# Patient Record
Sex: Male | Born: 1940 | Race: White | Hispanic: No | Marital: Married | Smoking: Never smoker
Health system: Southern US, Community
[De-identification: ages and names within clinical notes are randomized; demographics above are authoritative.]

## PROBLEM LIST (undated history)

## (undated) DIAGNOSIS — F32A Depression, unspecified: Secondary | ICD-10-CM

## (undated) DIAGNOSIS — Z8739 Personal history of other diseases of the musculoskeletal system and connective tissue: Secondary | ICD-10-CM

## (undated) DIAGNOSIS — S065XAA Traumatic subdural hemorrhage with loss of consciousness status unknown, initial encounter: Secondary | ICD-10-CM

## (undated) DIAGNOSIS — C801 Malignant (primary) neoplasm, unspecified: Secondary | ICD-10-CM

## (undated) DIAGNOSIS — I7771 Dissection of carotid artery: Secondary | ICD-10-CM

## (undated) DIAGNOSIS — L309 Dermatitis, unspecified: Secondary | ICD-10-CM

## (undated) DIAGNOSIS — G4730 Sleep apnea, unspecified: Secondary | ICD-10-CM

## (undated) DIAGNOSIS — G902 Horner's syndrome: Secondary | ICD-10-CM

## (undated) DIAGNOSIS — S064XAA Epidural hemorrhage with loss of consciousness status unknown, initial encounter: Secondary | ICD-10-CM

## (undated) DIAGNOSIS — R112 Nausea with vomiting, unspecified: Secondary | ICD-10-CM

## (undated) DIAGNOSIS — F329 Major depressive disorder, single episode, unspecified: Secondary | ICD-10-CM

## (undated) DIAGNOSIS — K649 Unspecified hemorrhoids: Secondary | ICD-10-CM

## (undated) DIAGNOSIS — E669 Obesity, unspecified: Secondary | ICD-10-CM

## (undated) DIAGNOSIS — H9190 Unspecified hearing loss, unspecified ear: Secondary | ICD-10-CM

## (undated) DIAGNOSIS — R972 Elevated prostate specific antigen [PSA]: Secondary | ICD-10-CM

## (undated) DIAGNOSIS — Q681 Congenital deformity of finger(s) and hand: Secondary | ICD-10-CM

## (undated) DIAGNOSIS — Z9889 Other specified postprocedural states: Secondary | ICD-10-CM

## (undated) DIAGNOSIS — I10 Essential (primary) hypertension: Secondary | ICD-10-CM

## (undated) DIAGNOSIS — E119 Type 2 diabetes mellitus without complications: Secondary | ICD-10-CM

## (undated) DIAGNOSIS — G43909 Migraine, unspecified, not intractable, without status migrainosus: Secondary | ICD-10-CM

## (undated) DIAGNOSIS — L719 Rosacea, unspecified: Secondary | ICD-10-CM

## (undated) HISTORY — PX: THUMB FUSION: SUR636

## (undated) HISTORY — DX: Obesity, unspecified: E66.9

## (undated) HISTORY — DX: Unspecified hearing loss, unspecified ear: H91.90

## (undated) HISTORY — DX: Essential (primary) hypertension: I10

## (undated) HISTORY — PX: ANKLE ARTHROSCOPY: SHX545

## (undated) HISTORY — DX: Depression, unspecified: F32.A

## (undated) HISTORY — DX: Horner's syndrome: G90.2

## (undated) HISTORY — DX: Sleep apnea, unspecified: G47.30

## (undated) HISTORY — PX: CHOLECYSTECTOMY: SHX55

## (undated) HISTORY — DX: Congenital deformity of finger(s) and hand: Q68.1

## (undated) HISTORY — PX: SUBDURAL HEMATOMA EVACUATION VIA CRANIOTOMY: SUR319

## (undated) HISTORY — DX: Dissection of carotid artery: I77.71

## (undated) HISTORY — PX: LIGAMENT REPAIR: SHX5444

## (undated) HISTORY — PX: RETINAL DETACHMENT SURGERY: SHX105

## (undated) HISTORY — DX: Dermatitis, unspecified: L30.9

## (undated) HISTORY — DX: Personal history of other diseases of the musculoskeletal system and connective tissue: Z87.39

## (undated) HISTORY — DX: Type 2 diabetes mellitus without complications: E11.9

## (undated) HISTORY — DX: Elevated prostate specific antigen (PSA): R97.20

## (undated) HISTORY — DX: Rosacea, unspecified: L71.9

## (undated) HISTORY — PX: TREATMENT FISTULA ANAL: SUR1390

## (undated) HISTORY — DX: Migraine, unspecified, not intractable, without status migrainosus: G43.909

## (undated) HISTORY — DX: Unspecified hemorrhoids: K64.9

## (undated) HISTORY — DX: Major depressive disorder, single episode, unspecified: F32.9

---
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
## 2004-10-28 HISTORY — PX: TREATMENT FISTULA ANAL: SUR1390

## 2005-10-28 HISTORY — PX: CHOLECYSTECTOMY: SHX55

## 2010-10-16 ENCOUNTER — Ambulatory Visit: Payer: Self-pay | Admitting: Gastroenterology

## 2012-11-12 ENCOUNTER — Ambulatory Visit: Payer: Self-pay | Admitting: Neurology

## 2012-11-17 ENCOUNTER — Ambulatory Visit: Payer: Self-pay | Admitting: Neurology

## 2013-10-12 ENCOUNTER — Ambulatory Visit: Payer: Self-pay | Admitting: Family Medicine

## 2013-11-01 ENCOUNTER — Ambulatory Visit: Payer: Self-pay | Admitting: Family Medicine

## 2014-06-13 DIAGNOSIS — F329 Major depressive disorder, single episode, unspecified: Secondary | ICD-10-CM | POA: Insufficient documentation

## 2014-06-13 DIAGNOSIS — I10 Essential (primary) hypertension: Secondary | ICD-10-CM | POA: Insufficient documentation

## 2014-06-13 DIAGNOSIS — F3341 Major depressive disorder, recurrent, in partial remission: Secondary | ICD-10-CM | POA: Insufficient documentation

## 2014-06-13 DIAGNOSIS — F32A Depression, unspecified: Secondary | ICD-10-CM | POA: Insufficient documentation

## 2014-06-13 DIAGNOSIS — G4730 Sleep apnea, unspecified: Secondary | ICD-10-CM | POA: Insufficient documentation

## 2014-08-01 ENCOUNTER — Ambulatory Visit (INDEPENDENT_AMBULATORY_CARE_PROVIDER_SITE_OTHER): Payer: Medicare Other

## 2014-08-01 ENCOUNTER — Ambulatory Visit (INDEPENDENT_AMBULATORY_CARE_PROVIDER_SITE_OTHER): Payer: Medicare Other | Admitting: Podiatry

## 2014-08-01 ENCOUNTER — Encounter: Payer: Self-pay | Admitting: Podiatry

## 2014-08-01 VITALS — BP 133/86 | HR 49 | Resp 16 | Ht 74.0 in | Wt 235.0 lb

## 2014-08-01 DIAGNOSIS — M779 Enthesopathy, unspecified: Secondary | ICD-10-CM

## 2014-08-01 DIAGNOSIS — M6740 Ganglion, unspecified site: Secondary | ICD-10-CM

## 2014-08-01 NOTE — Progress Notes (Signed)
   Subjective:    Patient ID: John [name], male    DOB: 01/24/1941, 73 y.o.   MRN: [id number]  HPI Comments: It feels like something is sticking out around my left ankle.(lateral)  i broke my ankle 7 yrs ago and i had surgery on it. The ankle does not hurt. The only time i feel something sticking out, is if im scratching it. i dont know how long its been sticking out cause it does not bother me. i dont do anything for my ankle.   Foot Pain      Review of Systems  Cardiovascular: Positive for palpitations.  All other systems reviewed and are negative.      Objective:   Physical Exam: I have reviewed his past medical history medications allergies surgeries social history and review of systems. Pulses are strongly palpable bilateral. Neurologic sensorium is intact per Semmes-Weinstein monofilament. Deep tendon reflexes are intact bilateral muscle strength is 5 over 5 dorsiflexors plantar flexors inverters everters all intrinsic musculature is intact. Orthopedic evaluation demonstrates all joints distal to the ankle a full range of motion without crepitation. Cutaneous evaluation demonstrates supple well hydrated cutis with exception of a mucoid cyst third digit of the left foot. There is a dry eschar present and no signs of infection. Radiographs does demonstrate a prominent screw from a previous open reduction internal fixation of the left fibula. This prominent screw is palpable on physical exam without signs of skin breakdown or infection.        Assessment & Plan:  Assessment: Painful internal fixation fibular malleolar screw left. He voices third digit PIPJ left foot. Non-complicated.  Plan: Discussed etiology pathology conservative versus surgical therapies. I expressed to him with either of these becoming painful I would be more than happy to surgically correct them for him. At this point they're not problem and I will followup with him as needed.

## 2015-05-12 ENCOUNTER — Encounter: Payer: Self-pay | Admitting: Urology

## 2015-05-12 ENCOUNTER — Other Ambulatory Visit: Payer: Self-pay | Admitting: Urology

## 2015-05-12 ENCOUNTER — Other Ambulatory Visit: Payer: Self-pay

## 2015-05-12 ENCOUNTER — Ambulatory Visit (INDEPENDENT_AMBULATORY_CARE_PROVIDER_SITE_OTHER): Payer: Medicare Other | Admitting: Urology

## 2015-05-12 VITALS — BP 144/78 | HR 52 | Ht 74.0 in | Wt 243.8 lb

## 2015-05-12 DIAGNOSIS — R972 Elevated prostate specific antigen [PSA]: Secondary | ICD-10-CM | POA: Diagnosis not present

## 2015-05-12 MED ORDER — GENTAMICIN SULFATE 40 MG/ML IJ SOLN
80.0000 mg | Freq: Once | INTRAMUSCULAR | Status: AC
  Administered 2015-05-12: 80 mg via INTRAMUSCULAR

## 2015-05-12 MED ORDER — LEVOFLOXACIN 500 MG PO TABS
500.0000 mg | ORAL_TABLET | Freq: Once | ORAL | Status: AC
  Administered 2015-05-12: 500 mg via ORAL

## 2015-05-12 NOTE — Progress Notes (Signed)
05/12/2015 10:46 AM   John [name] [birth year] [id number]  Referring provider: No referring provider defined for this encounter.  Chief Complaint  Patient presents with  . Prostate Biopsy    elevated PSA    HPI: 74 year old male initially referred for further workup of an elevated PSA. He presents today for prostate biopsy.  John [name] was found to have an elevated PSA on routine blood work by his PCP. On 02/26/2015, his PSA was found to be 7.9ng/mL and her PSA,free 0.81 and %free 10.3. Repeat PSA 8.8 on 03/20/15.   He is currently not having any irritative and/or obstructive voiding symptoms at this time. He also denies ED. He has not experienced any gross hematuria, weight loss, bone pain, fevers, chills and/or N/V. His father was diagnosed with PCa when he was in his late 110s. He did have an UTI 2 years ago.  Rectal exam enlarged, no nodules.    His IPSS is 1/0.    PMH: Past Medical History  Diagnosis Date  . Eczema   . Hemorrhoids   . H/O Osgood-Schlatter disease   . Depression   . Sleep apnea   . Migraine   . Rosacea   . Hand deformity, congenital   . Hearing loss bilateral  . Obesity   . Hypertension   . Elevated PSA     Surgical History: Past Surgical History  Procedure Laterality Date  . Cholecystectomy    . Treatment fistula anal    . Subdural hematoma evacuation via craniotomy    . Ankle arthroscopy    . Thumb fusion    . Retinal detachment surgery      Home Medications:    Medication List       This list is accurate as of: 05/12/15 10:46 AM.  Always use your most recent med list.               aspirin EC 81 MG tablet  Take by mouth.     atenolol 50 MG tablet  Commonly known as:  TENORMIN  Take 50 mg by mouth.     rizatriptan 10 MG tablet  Commonly known as:  MAXALT     venlafaxine XR 75 MG 24 hr capsule  Commonly known as:  EFFEXOR-XR  Take by mouth.        Allergies: No Known Allergies  Family History: Family  History  Problem Relation Age of Onset  . Osteoporosis Mother   . Depression Mother   . Prostate cancer Father   . Hypertension Father     Social History:  reports that he has never smoked. He does not have any smokeless tobacco history on file. He reports that he drinks alcohol. His drug history is not on file.   Physical Exam: BP 144/78 mmHg  Pulse 52  Ht 6\' 2"  (1.88 m)  Wt 243 lb 12.8 oz (110.587 kg)  BMI 31.29 kg/m2  Constitutional:  Alert and oriented, No acute distress. HEENT: [place] AT, moist mucus membranes.  Trachea midline, no masses. Cardiovascular: No clubbing, cyanosis, or edema. Respiratory: Normal respiratory effort, no increased work of breathing. GI: Abdomen is soft, nontender, nondistended, no abdominal masses.  Rectus diastasis noted. GU: No CVA tenderness. Normal sphincter tone. Skin: No rashes, bruises or suspicious lesions. Neurologic: Grossly intact, no focal deficits, moving all 4 extremities. Psychiatric: Normal mood and affect.   Prostate Biopsy Procedure   Informed consent was obtained after discussing risks/benefits of the procedure.  A time out was performed to  ensure correct patient identity.  Pre-Procedure: - Last PSA Level: 8.8 ng/ dL - Gentamicin given prophylactically - Levaquin 500 mg administered PO -Transrectal Ultrasound performed revealing a 68 gm prostate -Small median lobe noted with intravesical protrusion  Procedure: - Prostate block performed using 10 cc 1% lidocaine and biopsies taken from sextant areas, a total of 12 under ultrasound guidance.  Post-Procedure: - Patient tolerated the procedure well - He was counseled to seek immediate medical attention if experiences any severe pain, significant bleeding, or fevers - Return in one week to discuss biopsy results   Assessment & Plan:  As above  1. Elevated PSA S/p uncomplicated biopsy - levofloxacin (LEVAQUIN) tablet 500 mg; Take 1 tablet (500 mg total) by mouth once. -  gentamicin (GARAMYCIN) injection 80 mg; Inject 2 mLs (80 mg total) into the muscle once.   Return in about 1 week (around 05/19/2015) for biopsy results.  Hollice Espy, MD  [facility] [address], [place], [place] [address] [phone]

## 2015-05-12 NOTE — Progress Notes (Signed)
IM Injection  Patient is present today for an IM Injection prior to prostat biopsy Drug: Gentamicin Dose:80mg  Location:Left upper outer buttocks Lot: [id number] Exp:2/17 Patient tolerated well, no complications were noted  Preformed by: Fonnie Jarvis, CMA

## 2015-05-12 NOTE — Patient Instructions (Signed)

## 2015-05-17 LAB — PATHOLOGY REPORT

## 2015-05-18 ENCOUNTER — Other Ambulatory Visit: Payer: Self-pay | Admitting: Urology

## 2015-05-23 ENCOUNTER — Ambulatory Visit: Payer: Self-pay | Admitting: Urology

## 2015-05-31 ENCOUNTER — Ambulatory Visit: Payer: Medicare Other | Admitting: Urology

## 2015-05-31 ENCOUNTER — Ambulatory Visit (INDEPENDENT_AMBULATORY_CARE_PROVIDER_SITE_OTHER): Payer: Medicare Other | Admitting: Urology

## 2015-05-31 VITALS — BP 143/80 | HR 50 | Ht 74.0 in | Wt 243.4 lb

## 2015-05-31 DIAGNOSIS — C61 Malignant neoplasm of prostate: Secondary | ICD-10-CM

## 2015-06-04 ENCOUNTER — Encounter: Payer: Self-pay | Admitting: Urology

## 2015-06-04 DIAGNOSIS — C61 Malignant neoplasm of prostate: Secondary | ICD-10-CM | POA: Insufficient documentation

## 2015-06-04 NOTE — Progress Notes (Signed)
12:29 PM  05/31/15  [name] 1941/08/09 [id number]  Referring provider: No referring provider defined for this encounter.  Chief Complaint  Patient presents with  . Results    HPI: 74 year old male initially referred for further workup of an elevated PSA to 8.8, normal DRE s/p prostate biopsy on 05/12/15 demonstrating Gleason 3+3 involving 1 of 12 cores, 32% involvement. TRUS volume 68 cc with slight intravesical protrusion of the median lobe.  He returns to the office today to discuss his biopsy pathology results.   He is currently not having any irritative and/or obstructive voiding symptoms. He also denies ED. He has not experienced any gross hematuria, weight loss, bone pain, fevers, chills and/or N/V. His father was diagnosed with PCa when he was in his late 68s but died in his 21s of other casues.   His IPSS is 1/0.    PMH: Past Medical History  Diagnosis Date  . Eczema   . Hemorrhoids   . H/O Osgood-Schlatter disease   . Depression   . Sleep apnea   . Migraine   . Rosacea   . Hand deformity, congenital   . Hearing loss bilateral  . Obesity   . Hypertension   . Elevated PSA     Surgical History: Past Surgical History  Procedure Laterality Date  . Cholecystectomy    . Treatment fistula anal    . Subdural hematoma evacuation via craniotomy    . Ankle arthroscopy    . Thumb fusion    . Retinal detachment surgery      Home Medications:    Medication List       This list is accurate as of: 05/31/15 11:59 PM.  Always use your most recent med list.               aspirin EC 81 MG tablet  Take by mouth.     atenolol 50 MG tablet  Commonly known as:  TENORMIN  Take 50 mg by mouth daily.     rizatriptan 10 MG tablet  Commonly known as:  MAXALT     venlafaxine XR 75 MG 24 hr capsule  Commonly known as:  EFFEXOR-XR  Take by mouth.        Allergies: No Known Allergies  Family History: Family History  Problem Relation Age of Onset  .  Osteoporosis Mother   . Depression Mother   . Prostate cancer Father   . Hypertension Father     Social History:  reports that he has never smoked. He does not have any smokeless tobacco history on file. He reports that he drinks alcohol. His drug history is not on file.   Physical Exam: BP 143/80 mmHg  Pulse 50  Ht 6\' 2"  (1.88 m)  Wt 243 lb 6.4 oz (110.406 kg)  BMI 31.24 kg/m2  Constitutional:  Alert and oriented, No acute distress. HEENT: [place] AT, moist mucus membranes.  Trachea midline, no masses. Respiratory: Normal respiratory effort, no increased work of breathing. Skin: No rashes, bruises or suspicious lesions. Neurologic: Grossly intact, no focal deficits, moving all 4 extremities. Psychiatric: Normal mood and affect.  Pathology:  Diagnosis:  A. PROSTATIC ADENOCARCINOMA. GLEASON'S SCORE 6 (GRADES 3 + 3) NOTED IN 1  OUT OF 1 SUBMITTED PROSTATE CORE SEGMENTS. APPROXIMATELY 32% OF SUBMITTED  TISSUE INVOLVED.  B. BENIGN PROSTATE TISSUE. NO EVIDENCE OF MALIGNANCY.  C. BENIGN PROSTATE TISSUE. NO EVIDENCE OF MALIGNANCY.  D. BENIGN PROSTATE TISSUE. NO EVIDENCE OF MALIGNANCY.  E. BENIGN PROSTATE  TISSUE. NO EVIDENCE OF MALIGNANCY.  F. PROSTATIC TISSUE CONTAINING A SMALL FOCUS OF ATYPICAL GLANDS SUSPICIOUS  BUT NOT DIAGNOSTIC OF ADENOCARCINOMA.  G. BENIGN PROSTATE TISSUE. NO EVIDENCE OF MALIGNANCY.  H. BENIGN PROSTATE TISSUE WITH FOCAL CHRONIC INFLAMMATION. NO EVIDENCE OF  MALIGNANCY.  I. BENIGN PROSTATE TISSUE. NO EVIDENCE OF MALIGNANCY.  J. BENIGN PROSTATE TISSUE. NO EVIDENCE OF MALIGNANCY.  K. BENIGN PROSTATE TISSUE. NO EVIDENCE OF MALIGNANCY.  L. BENIGN PROSTATE TISSUE. NO EVIDENCE OF MALIGNANCY.   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    1. Prostate cancer The patient was counseled about the natural history of prostate cancer and the standard treatment options that are  available for prostate cancer. It was explained to him how his age and life expectancy, clinical stage, Gleason score, and PSA affect his prognosis, the decision to proceed with additional staging studies, as well as how that information influences recommended treatment strategies. We discussed the roles for active surveillance, radiation therapy, surgical therapy, androgen deprivation, as well as ablative therapy options for the treatment of prostate cancer as appropriate to his individual cancer situation. We discussed the risks and benefits of these options with regard to their impact on cancer control and also in terms of potential adverse events, complications, and impact on quality of life particularly related to urinary, bowel, and sexual function. The patient was encouraged to ask questions throughout the discussion today and all questions were answered to his stated satisfaction. In addition, the patient was provided with and/or directed to appropriate resources and literature for further education about prostate cancer treatment options.  In the patient's low risk prostate cancer age, I strongly encouraged her to consider active surveillance. He was also offered a referral to radiation oncology to further discuss. I do not feel that he is an excellent surgical candidate given his age and low risk diease.    We discussed the active surveillance protocol in detail including serial DRE/PSA surveillance and repeat prostate biopsy in 6 months to 1 year.  He would like to proceed with active surveillance at this time.    GU oncology navigator was present for today's visit and has provided him with contact information as well.   Return in about 4 months (around 09/30/2015) for PSA/ DRE.  Hollice Espy, MD  [facility] [address], [place], [place] [address] [phone]  I spent 30 min with this patient of which greater than 50% was spent in counseling and  coordination of care with the patient.

## 2015-06-19 DIAGNOSIS — C61 Malignant neoplasm of prostate: Secondary | ICD-10-CM | POA: Insufficient documentation

## 2015-07-20 ENCOUNTER — Other Ambulatory Visit: Payer: Self-pay | Admitting: Ophthalmology

## 2015-07-20 ENCOUNTER — Ambulatory Visit
Admission: RE | Admit: 2015-07-20 | Discharge: 2015-07-20 | Disposition: A | Discharge status: Home / Self Care | Payer: Medicare Other | Source: Ambulatory Visit | Attending: Ophthalmology | Admitting: Ophthalmology

## 2015-07-20 DIAGNOSIS — C61 Malignant neoplasm of prostate: Secondary | ICD-10-CM | POA: Diagnosis not present

## 2015-07-20 DIAGNOSIS — G902 Horner's syndrome: Secondary | ICD-10-CM

## 2015-07-20 HISTORY — DX: Malignant (primary) neoplasm, unspecified: C80.1

## 2015-07-20 LAB — POCT I-STAT CREATININE: CREATININE: 0.8 mg/dL (ref 0.61–1.24)

## 2015-07-20 MED ORDER — IOHEXOL 350 MG/ML SOLN
80.0000 mL | Freq: Once | INTRAVENOUS | Status: AC | PRN
  Administered 2015-07-20: 80 mL via INTRAVENOUS

## 2015-07-31 ENCOUNTER — Ambulatory Visit: Payer: Medicare Other

## 2015-08-31 ENCOUNTER — Other Ambulatory Visit: Payer: Self-pay | Admitting: Neurology

## 2015-08-31 DIAGNOSIS — I7771 Dissection of carotid artery: Secondary | ICD-10-CM

## 2015-08-31 DIAGNOSIS — R404 Transient alteration of awareness: Secondary | ICD-10-CM

## 2015-08-31 HISTORY — DX: Dissection of carotid artery: I77.71

## 2015-09-11 ENCOUNTER — Ambulatory Visit
Admission: RE | Admit: 2015-09-11 | Discharge: 2015-09-11 | Disposition: A | Discharge status: Home / Self Care | Payer: Medicare Other | Source: Ambulatory Visit | Attending: Neurology | Admitting: Neurology

## 2015-09-11 DIAGNOSIS — R9082 White matter disease, unspecified: Secondary | ICD-10-CM | POA: Insufficient documentation

## 2015-09-11 DIAGNOSIS — R404 Transient alteration of awareness: Secondary | ICD-10-CM | POA: Insufficient documentation

## 2015-09-11 MED ORDER — GADOBENATE DIMEGLUMINE 529 MG/ML IV SOLN
20.0000 mL | Freq: Once | INTRAVENOUS | Status: AC | PRN
  Administered 2015-09-11: 20 mL via INTRAVENOUS

## 2015-10-06 ENCOUNTER — Encounter: Payer: Self-pay | Admitting: Urology

## 2015-10-06 ENCOUNTER — Ambulatory Visit (INDEPENDENT_AMBULATORY_CARE_PROVIDER_SITE_OTHER): Payer: Medicare Other | Admitting: Urology

## 2015-10-06 VITALS — BP 126/81 | HR 63 | Ht 74.0 in | Wt 248.7 lb

## 2015-10-06 DIAGNOSIS — C61 Malignant neoplasm of prostate: Secondary | ICD-10-CM | POA: Diagnosis not present

## 2015-10-06 LAB — URINALYSIS, COMPLETE
BILIRUBIN UA: NEGATIVE
Glucose, UA: NEGATIVE
KETONES UA: NEGATIVE
LEUKOCYTES UA: NEGATIVE
Nitrite, UA: NEGATIVE
PROTEIN UA: NEGATIVE
RBC UA: NEGATIVE
UUROB: 0.2 mg/dL (ref 0.2–1.0)
pH, UA: 5 (ref 5.0–7.5)

## 2015-10-06 LAB — MICROSCOPIC EXAMINATION: RBC, UA: NONE SEEN /hpf (ref 0–?)

## 2015-10-06 NOTE — Progress Notes (Signed)
9:00 AM  05/31/15  John [name] 1941-03-11 [id number]  Referring provider: PCP [place]   Prostate cancer  HPI: 75 year old male with lower risk prostate cancer dx 04/2015 on active surviellnece.  He returns today for DRE/ PSA.  He was nitially referred for further workup of an elevated PSA to 8.8, normal DRE s/p prostate biopsy on 05/12/15 demonstrating Gleason 3+3 involving 1 of 12 cores, 32% involvement. TRUS volume 68 cc with slight intravesical protrusion of the median lobe.   He is currently not having any irritative and/or obstructive voiding symptoms. He also denies ED. He has not experienced any gross hematuria, weight loss, bone pain, fevers, chills and/or N/V. His father was diagnosed with PCa when he was in his late 35s but died in his 10s of other casues.   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      PMH: Past Medical History  Diagnosis Date  . Eczema   . Hemorrhoids   . H/O Osgood-Schlatter disease   . Depression   . Sleep apnea   . Migraine   . Rosacea   . Hand deformity, congenital   . Hearing loss bilateral  . Obesity   . Hypertension   . Elevated PSA   . Cancer ([place])   . Horner syndrome   . Depression   . 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 Cholecystectomy    . Treatment fistula anal    . Subdural hematoma evacuation via craniotomy    . Ankle arthroscopy    . Thumb fusion    . Retinal detachment surgery      Home Medications:    Medication List       This list is accurate as of: 10/06/15  9:00 AM.  Always use your most recent med list.               aspirin EC 81 MG tablet  Take by mouth.     atenolol 50 MG tablet  Commonly known as:  TENORMIN  Take 50 mg by mouth daily.     clopidogrel 75 MG tablet  Commonly known as:  PLAVIX     rizatriptan 10 MG tablet  Commonly known  as:  MAXALT     venlafaxine XR 75 MG 24 hr capsule  Commonly known as:  EFFEXOR-XR  TAKE ONE CAPSULE BY MOUTH ONCE A DAY        Allergies: No Known Allergies  Family History: Family History  Problem Relation Age of Onset  . Osteoporosis Mother   . Depression Mother   . Prostate cancer Father   . Hypertension Father     Social History:  reports that he has never smoked. He does not have any smokeless tobacco history on file. He reports that he drinks alcohol. His drug history is not on file.   Physical Exam: BP 126/81 mmHg  Pulse 63  Ht 6\' 2"  (1.88 m)  Wt 248 lb 11.2 oz (112.81 kg)  BMI 31.92 kg/m2  Constitutional:  Alert and oriented, No acute distress. HEENT: [place] AT, moist mucus membranes.  Trachea midline, no masses. Respiratory: Normal respiratory effort, no increased work of breathing. Abd: Soft, nontender, nondistended Rectal exam: Slightly lax sphincter tone. 50 cc prostate, nontender, no nodules Skin: No rashes, bruises or suspicious lesions. Neurologic: Grossly intact, no focal deficits, moving all 4 extremities. Psychiatric:  Normal mood and affect.  Pathology:  Diagnosis:  A. PROSTATIC ADENOCARCINOMA. GLEASON'S SCORE 6 (GRADES 3 + 3) NOTED IN 1  OUT OF 1 SUBMITTED PROSTATE CORE SEGMENTS. APPROXIMATELY 32% OF SUBMITTED  TISSUE INVOLVED.  B. BENIGN PROSTATE TISSUE. NO EVIDENCE OF MALIGNANCY.  C. BENIGN PROSTATE TISSUE. NO EVIDENCE OF MALIGNANCY.  D. BENIGN PROSTATE TISSUE. NO EVIDENCE OF MALIGNANCY.  E. BENIGN PROSTATE TISSUE. NO EVIDENCE OF MALIGNANCY.  F. PROSTATIC TISSUE CONTAINING A SMALL FOCUS OF ATYPICAL GLANDS SUSPICIOUS  BUT NOT DIAGNOSTIC OF ADENOCARCINOMA.  G. BENIGN PROSTATE TISSUE. NO EVIDENCE OF MALIGNANCY.  H. BENIGN PROSTATE TISSUE WITH FOCAL CHRONIC INFLAMMATION. NO EVIDENCE OF  MALIGNANCY.  I. BENIGN PROSTATE TISSUE. NO EVIDENCE OF MALIGNANCY.  J. BENIGN PROSTATE TISSUE. NO EVIDENCE OF MALIGNANCY.  K. BENIGN PROSTATE TISSUE. NO  EVIDENCE OF MALIGNANCY.  L. BENIGN PROSTATE TISSUE. NO EVIDENCE OF MALIGNANCY.   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    1. Prostate cancer PSA drawn today, will follow up with patient  rectal exam remains benign Recommend continuation of active surveillance protocol including PSA in 4 months Plan for repeat biopsy in 04/2016  Return in about 4 months (around 02/04/2016) for PSA/ DRE.   Hollice Espy, MD  [facility] [address], [place], [place] [address] [phone]

## 2015-10-07 LAB — PSA: PROSTATE SPECIFIC AG, SERUM: 9.3 ng/mL — AB (ref 0.0–4.0)

## 2015-10-09 ENCOUNTER — Telehealth: Payer: Self-pay

## 2015-10-09 NOTE — Telephone Encounter (Signed)
-----   Message from Hollice Espy, MD sent at 10/09/2015 12:10 PM EST ----- Your PSA is relatively stable.   Plan for recheck in 4 months.  Hollice Espy, MD

## 2015-10-09 NOTE — Telephone Encounter (Signed)
LMOM

## 2015-10-10 NOTE — Telephone Encounter (Signed)
Spoke with pt in reference to PSA. Pt voiced understanding.  

## 2015-10-10 NOTE — Telephone Encounter (Signed)
LMOM

## 2015-12-22 DIAGNOSIS — G43909 Migraine, unspecified, not intractable, without status migrainosus: Secondary | ICD-10-CM | POA: Insufficient documentation

## 2016-01-26 ENCOUNTER — Other Ambulatory Visit: Payer: Medicare Other

## 2016-01-26 DIAGNOSIS — C61 Malignant neoplasm of prostate: Secondary | ICD-10-CM

## 2016-01-27 LAB — PSA: Prostate Specific Ag, Serum: 10.2 ng/mL — ABNORMAL HIGH (ref 0.0–4.0)

## 2016-01-29 ENCOUNTER — Telehealth: Payer: Self-pay

## 2016-01-29 NOTE — Telephone Encounter (Signed)
-----   Message from Hollice Espy, MD sent at 01/29/2016  7:29 AM EDT ----- PSA up slightly again to 10.2 from baseline 8.8 at the time of prostate cancer dx.  I would like to plan for biopsy on 04/2016 as discussed.  Please make sure that this is arranged.    Hollice Espy, MD

## 2016-01-29 NOTE — Telephone Encounter (Signed)
LMOM

## 2016-01-29 NOTE — Telephone Encounter (Signed)
Spoke with pt in reference to PSA results. Pt agreed to prostate bx. Verbally went over pre-bx prep. Will mail out pre prostate bx instructions. Will obtain clearance from PCP to stop ASA. Pt stated he has stopped plavix previously. Pt voiced understanding of conversation.

## 2016-02-07 ENCOUNTER — Encounter: Payer: Self-pay | Admitting: Urology

## 2016-02-07 ENCOUNTER — Ambulatory Visit: Payer: Medicare Other | Admitting: Urology

## 2016-02-07 ENCOUNTER — Telehealth: Payer: Self-pay | Admitting: Urology

## 2016-02-07 NOTE — Telephone Encounter (Signed)
Mr. John [name] called saying he'd forgotten about his scheduled appointment today and is wondering if he needs to reschedule the appointment before his biopsy in July. He'd like a phone call regarding this.  Pt's ph# [phone]  Thank you.

## 2016-02-07 NOTE — Telephone Encounter (Signed)
Spoke with pt and made aware he does need to come in for a PSA and DRE. Reinforced with pt these need to be performed to ensure there have been no changes and if so they can be addressed. Pt voiced understanding and was transferred to the front to make f/u appt.

## 2016-02-09 ENCOUNTER — Ambulatory Visit: Payer: Medicare Other | Admitting: Urology

## 2016-02-15 ENCOUNTER — Ambulatory Visit (INDEPENDENT_AMBULATORY_CARE_PROVIDER_SITE_OTHER): Payer: Medicare Other | Admitting: Urology

## 2016-02-15 ENCOUNTER — Encounter: Payer: Self-pay | Admitting: Urology

## 2016-02-15 VITALS — BP 133/83 | HR 54 | Ht 74.0 in | Wt 249.9 lb

## 2016-02-15 DIAGNOSIS — C61 Malignant neoplasm of prostate: Secondary | ICD-10-CM

## 2016-02-15 NOTE — Progress Notes (Signed)
02/15/2016 11:50 AM   John [name] [birth year] [id number]  Referring provider: Juluis Pitch, MD [address] [place], [place] [address]  Chief Complaint  Patient presents with  . 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  Background history He was initially referred for further workup of an elevated PSA to 8.8, normal DRE s/p prostate biopsy on 05/12/15 demonstrating Gleason 3+3 involving 1 of 12 cores, 32% involvement. TRUS volume 68 cc with slight intravesical protrusion of the median lobe.  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   His most recent PSA was 10.2 on 01/26/2016.  He is scheduled for a biopsy in July 2017.   He is currently not having any irritative and/or obstructive voiding symptoms. He also denies ED. He has not experienced any gross hematuria, weight loss, bone pain, fevers, chills and/or N/V. His father was diagnosed with PCa when he was in his late 67s but died in his 62s of other causes.   His I PSS score today was 1/1.      IPSS      02/15/16 1100       International Prostate Symptom Score   How often have you had the sensation of not emptying your bladder? Not at All     How often have you had to urinate less than every two hours? Not at All     How often have you found you stopped and started again several times when you urinated? Not at All     How often have you found it difficult to postpone urination? Not at All     How often have you had a weak urinary stream? Not at All     How often have you had to strain to start urination? Not at All     How many times did you typically get up at night to urinate? 1 Time     Total IPSS Score 1     Quality of Life due to urinary symptoms   If you were to spend the rest of your life  with your urinary condition just the way it is now how would you feel about that? Pleased        Score:  1-7 Mild 8-19 Moderate 20-35 Severe  PMH: Past Medical History  Diagnosis Date  . Eczema   . Hemorrhoids   . H/O Osgood-Schlatter disease   . Depression   . Sleep apnea   . Migraine   . Rosacea   . Hand deformity, congenital   . Hearing loss bilateral  . Obesity   . Hypertension   . Elevated PSA   . Cancer ([place])   . Horner syndrome   . Depression   . 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 Cholecystectomy    . Treatment fistula anal    . Subdural hematoma evacuation via craniotomy    . Ankle arthroscopy    . Thumb fusion    . Retinal detachment surgery      Home Medications:    Medication List       This list is accurate as of: 02/15/16 11:50 AM.  Always use your most recent med list.  aspirin EC 81 MG tablet  Take by mouth.     atenolol 50 MG tablet  Commonly known as:  TENORMIN  Take 50 mg by mouth daily.     clopidogrel 75 MG tablet  Commonly known as:  PLAVIX  Reported on 02/15/2016     levETIRAcetam 500 MG tablet  Commonly known as:  KEPPRA  Take by mouth.     rizatriptan 10 MG tablet  Commonly known as:  MAXALT     venlafaxine XR 75 MG 24 hr capsule  Commonly known as:  EFFEXOR-XR  TAKE ONE CAPSULE BY MOUTH ONCE A DAY        Allergies: No Known Allergies  Family History: Family History  Problem Relation Age of Onset  . Osteoporosis Mother   . Depression Mother   . Prostate cancer Father   . Hypertension Father   . Kidney disease Neg Hx     Social History:  reports that he has never smoked. He does not have any smokeless tobacco history on file. He reports that he drinks alcohol. He reports that he does not use illicit drugs.  ROS: UROLOGY Frequent Urination?: No Hard to postpone urination?: No Burning/pain with urination?:  No Get up at night to urinate?: No Leakage of urine?: No Urine stream starts and stops?: No Trouble starting stream?: No Do you have to strain to urinate?: No Blood in urine?: No Urinary tract infection?: No Sexually transmitted disease?: No Injury to kidneys or bladder?: No Painful intercourse?: No Weak stream?: No Erection problems?: No Penile pain?: No  Gastrointestinal Nausea?: No Vomiting?: No Indigestion/heartburn?: No Diarrhea?: No Constipation?: No  Constitutional Fever: No Night sweats?: No Weight loss?: No Fatigue?: No  Skin Skin rash/lesions?: No Itching?: No  Eyes Blurred vision?: No Double vision?: No  Ears/Nose/Throat Sore throat?: No Sinus problems?: No  Hematologic/Lymphatic Swollen glands?: No Easy bruising?: No  Cardiovascular Leg swelling?: No Chest pain?: No  Respiratory Cough?: No Shortness of breath?: No  Endocrine Excessive thirst?: No  Musculoskeletal Back pain?: No Joint pain?: No  Neurological Headaches?: No Dizziness?: No  Psychologic Depression?: No Anxiety?: No  Physical Exam: BP 133/83 mmHg  Pulse 54  Ht 6\' 2"  (1.88 m)  Wt 249 lb 14.4 oz (113.354 kg)  BMI 32.07 kg/m2  Constitutional: Well nourished. Alert and oriented, No acute distress. HEENT: [place] AT, moist mucus membranes. Trachea midline, no masses. Cardiovascular: No clubbing, cyanosis, or edema. Respiratory: Normal respiratory effort, no increased work of breathing. Rectal: Patient with  normal sphincter tone. Anus and perineum without scarring or rashes. No rectal masses are appreciated. Prostate is approximately 50 grams, no nodules are appreciated. Seminal vesicles are normal. Skin: No rashes, bruises or suspicious lesions. Lymph: No cervical or inguinal adenopathy. Neurologic: Grossly intact, no focal deficits, moving all 4 extremities. Psychiatric: Normal mood and affect.  Laboratory Data:  Lab Results  Component Value Date   CREATININE  0.80 07/20/2015    PSA history  8.8 ng/mL on 05/18/2015, biopsy at that time noted low risk Gleason 3+3 prostate cancer  9.3 ng/mL on 10/06/2015  10.2 ng/mL on 01/26/2016     Assessment & Plan:    1. Prostate cancer:   Patient diagnosed with low risk Gleason 3+3 prostate cancer, cT1cNxMx, PSA 8.8, TRUS 68cc in 07/216.  Since that time PSA's have remained relatively stable.  He will continue the active surveillance protocol and will be returning in July 2017 for a repeat biopsy.  Patient is on anticoagulation therapy and will  need clearance prior to coming off those medications.   Return for keep follow up biopsy appointment in July with Dr. Erlene Quan.  These notes generated with voice recognition software. I apologize for typographical errors.  Zara Council, [facility] [address], [place], [place] [address] [phone]

## 2016-02-18 ENCOUNTER — Telehealth: Payer: Self-pay | Admitting: Urology

## 2016-02-18 NOTE — Telephone Encounter (Signed)
Patient has an upcoming prostate biopsy in July 2017.  He is on anticoagulation therapy for a carotid dissection.  He will need clearance to stop these medications prior to the biopsy.

## 2016-02-23 NOTE — Telephone Encounter (Signed)
Will obtain clearance.

## 2016-04-11 ENCOUNTER — Other Ambulatory Visit: Payer: Self-pay | Admitting: Urology

## 2016-04-11 ENCOUNTER — Encounter: Payer: Self-pay | Admitting: Urology

## 2016-04-11 ENCOUNTER — Ambulatory Visit (INDEPENDENT_AMBULATORY_CARE_PROVIDER_SITE_OTHER): Payer: Medicare Other | Admitting: Urology

## 2016-04-11 VITALS — BP 144/92 | HR 54 | Ht 74.0 in | Wt 238.0 lb

## 2016-04-11 DIAGNOSIS — Z8546 Personal history of malignant neoplasm of prostate: Secondary | ICD-10-CM

## 2016-04-11 MED ORDER — GENTAMICIN SULFATE 40 MG/ML IJ SOLN
80.0000 mg | Freq: Once | INTRAMUSCULAR | Status: AC
  Administered 2016-04-11: 80 mg via INTRAMUSCULAR

## 2016-04-11 MED ORDER — LEVOFLOXACIN 500 MG PO TABS
500.0000 mg | ORAL_TABLET | Freq: Once | ORAL | Status: AC
  Administered 2016-04-11: 500 mg via ORAL

## 2016-04-11 NOTE — Progress Notes (Signed)
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  Background history He was initially referred for further workup of an elevated PSA to 8.8, normal DRE s/p prostate biopsy on 05/12/15 demonstrating Gleason 3+3 involving 1 of 12 cores, 32% involvement. TRUS volume 68 cc with slight intravesical protrusion of the median lobe.   His most recent PSA was 10.2 on 01/26/2016. He is scheduled for a biopsy in July 2017. He is currently not having any irritative and/or obstructive voiding symptoms. He also denies ED. He has not experienced any gross hematuria, weight loss, bone pain, fevers, chills and/or N/V. His father was diagnosed with PCa when he was in his late 53s but died in his 44s of other causes.   His I PSS score today was 1/1.   Prostate Biopsy Procedure   Informed consent was obtained after discussing risks/benefits of the procedure.  A time out was performed to ensure correct patient identity.  Pre-Procedure: - Last PSA Level:  Component     Latest Ref Rng 10/06/2015 01/26/2016  PSA     0.0 - 4.0 ng/mL 9.3 (H) 10.2 (H)   - Gentamicin given prophylactically - Levaquin 500 mg administered PO -Transrectal Ultrasound performed revealing a 68 gm prostate -No significant hypoechoic intravesical median lobe (small/ med) appreciated multiple diffuse calcifications  Procedure: - Prostate block performed using 10 cc 1% lidocaine and biopsies taken from sextant areas, a total of 12 under ultrasound guidance.  Post-Procedure: - Patient tolerated the procedure well - He was counseled to seek immediate medical attention if experiences any severe pain, significant bleeding, or fevers - Return in one week to discuss biopsy results

## 2016-04-17 LAB — PATHOLOGY REPORT

## 2016-04-19 ENCOUNTER — Ambulatory Visit: Payer: Medicare Other | Admitting: Urology

## 2016-04-19 ENCOUNTER — Other Ambulatory Visit: Payer: Medicare Other | Admitting: Urology

## 2016-04-19 ENCOUNTER — Telehealth: Payer: Self-pay | Admitting: Urology

## 2016-04-19 ENCOUNTER — Other Ambulatory Visit: Payer: Self-pay | Admitting: Urology

## 2016-04-19 NOTE — Telephone Encounter (Signed)
Call patient to review pathology results. These are essentially unchanged from previous. He had 1 core positive Gleason 3+3, 5% of the core at the right lateral base. There is also evidence of chronic inflammation.  I have recommended continuation of active surveillance of which the patient is agreeable.   Plan for follow-up in 4 months with PSA prior.  Hollice Espy, MD

## 2016-04-21 ENCOUNTER — Encounter: Payer: Self-pay | Admitting: Urology

## 2016-04-22 ENCOUNTER — Telehealth: Payer: Self-pay

## 2016-04-22 NOTE — Telephone Encounter (Signed)
appts have been made and patient has been notified   michelle

## 2016-04-22 NOTE — Telephone Encounter (Signed)
Pt called stating that he had a prostate bx on 04/11/16 with Dr. Erlene Quan. Pt stated that Dr. Erlene Quan stressed the importance of going to the ER if develops a fever. Pt stated that he has been running a low grade fever of 99 and feels as though he is getting the flu. Pt described his symptoms to be back pain, joint and muscle aches, sore throat with low grade fever. Pt stated that he did not feel he was severe enough for the ER but did want to call. Please advise.

## 2016-04-22 NOTE — Telephone Encounter (Signed)
Per Larene Beach if pt is having urinary like symptoms then he should be evaluated. If not then he should monitor is his symptoms and if he gets rigors or fever spikes 102+ he should go to the ER.  Spoke with pt in reference to urinary symptoms. Pt denied dysuria, hematuria, feeling of not emptying his bladder. Pt stated that he has had a UTI previously and he does not feel like he did then. Pt stated that his urine stream is full and in that "area" he feels normal. Reinforced with pt to monitor is current symptoms of body aches, low grade fever, and should he develop a 102+ greater fever or rigors he should go straight to the ER. Pt voiced understanding of whole conversation.

## 2016-05-07 ENCOUNTER — Ambulatory Visit: Payer: Medicare Other | Admitting: Urology

## 2016-05-10 ENCOUNTER — Other Ambulatory Visit: Payer: Medicare Other | Admitting: Urology

## 2016-05-17 ENCOUNTER — Ambulatory Visit: Payer: Medicare Other | Admitting: Urology

## 2016-06-21 DIAGNOSIS — Z87898 Personal history of other specified conditions: Secondary | ICD-10-CM | POA: Insufficient documentation

## 2016-06-28 ENCOUNTER — Other Ambulatory Visit: Payer: Medicare Other | Admitting: Urology

## 2016-07-05 ENCOUNTER — Ambulatory Visit: Payer: Medicare Other | Admitting: Urology

## 2016-08-16 ENCOUNTER — Other Ambulatory Visit: Payer: Self-pay

## 2016-08-16 DIAGNOSIS — Z8546 Personal history of malignant neoplasm of prostate: Secondary | ICD-10-CM

## 2016-08-19 ENCOUNTER — Other Ambulatory Visit: Payer: Medicare Other

## 2016-08-19 DIAGNOSIS — Z8546 Personal history of malignant neoplasm of prostate: Secondary | ICD-10-CM

## 2016-08-20 LAB — PSA: Prostate Specific Ag, Serum: 14.6 ng/mL — ABNORMAL HIGH (ref 0.0–4.0)

## 2016-08-23 ENCOUNTER — Encounter: Payer: Self-pay | Admitting: Urology

## 2016-08-23 ENCOUNTER — Ambulatory Visit (INDEPENDENT_AMBULATORY_CARE_PROVIDER_SITE_OTHER): Payer: Medicare Other | Admitting: Urology

## 2016-08-23 VITALS — BP 142/79 | HR 55 | Ht 74.0 in | Wt 239.0 lb

## 2016-08-23 DIAGNOSIS — C61 Malignant neoplasm of prostate: Secondary | ICD-10-CM

## 2016-08-23 NOTE — Progress Notes (Signed)
08/23/2016 10:34 AM   [name] Aug 14, 1941 [id number]  Referring provider: Juluis Pitch, MD [phone] 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    Background history He was initially referred for further workup of an elevated PSA to 8.8, normal DRE s/p prostate biopsy on 05/12/15 demonstrating Gleason 3+3 involving 1 of 12 cores, 32% involvement. TRUS volume 68 cc with slight intravesical protrusion of the median lobe.   Repeat biopsy on 04/11/16 showed 1 core Gleason 3+3, 5% with evidence of chronic inflammation.    His most recent PSA on 08/20/15 was 14.6 up from 10.2 on 01/26/2016.    He is currently not having any irritative and/or obstructive voiding symptoms. He also denies ED. He has not experienced any gross hematuria, weight loss, bone pain, fevers, chills and/or N/V. His father was diagnosed with PCa when he was in his late 3s but died in his 59s of other causes.    PMH: Past Medical History:  Diagnosis Date  . Cancer ([place])   . Depression   . Depression   . 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 Eczema   . Elevated PSA   . H/O Osgood-Schlatter disease   . Hand deformity, congenital   . Hearing loss bilateral  . Hemorrhoids   . Horner syndrome   . Hypertension   . Migraine   . Obesity   . Rosacea   . Sleep apnea     Surgical History: Past Surgical History:  Procedure Laterality Date  . ANKLE ARTHROSCOPY    . CHOLECYSTECTOMY    . RETINAL DETACHMENT SURGERY    . SUBDURAL HEMATOMA EVACUATION VIA CRANIOTOMY    . THUMB FUSION    . TREATMENT FISTULA ANAL      Home Medications:    Medication List       Accurate as of 08/23/16 10:34 AM. Always use your most recent med list.          aspirin EC 81 MG tablet Take by mouth.   atenolol 50 MG tablet Commonly known as:  TENORMIN Take 50 mg by  mouth daily.   levETIRAcetam 500 MG tablet Commonly known as:  KEPPRA Take by mouth.   rizatriptan 10 MG tablet Commonly known as:  MAXALT   venlafaxine XR 75 MG 24 hr capsule Commonly known as:  EFFEXOR-XR TAKE ONE CAPSULE BY MOUTH ONCE A DAY       Allergies: No Known Allergies  Family History: Family History  Problem Relation Age of Onset  . Osteoporosis Mother   . Depression Mother   . Prostate cancer Father   . Hypertension Father   . Kidney disease Neg Hx     Social History:  reports that he has never smoked. He has never used smokeless tobacco. He reports that he drinks alcohol. He reports that he does not use drugs.  ROS: UROLOGY Frequent Urination?: No Hard to postpone urination?: No Burning/pain with urination?: No Get up at night to urinate?: Yes Leakage of urine?: No Urine stream starts and stops?: No Trouble starting stream?: No Do you have to strain to urinate?: No Blood in urine?: No Urinary tract infection?: No Sexually transmitted disease?: No Injury to kidneys or bladder?: No Painful intercourse?: No Weak stream?: No Erection problems?: No Penile pain?: No  Gastrointestinal Nausea?: No Vomiting?: No Indigestion/heartburn?: No Diarrhea?: No Constipation?: No  Constitutional Fever: No Night sweats?: No Weight loss?: No Fatigue?: No  Skin Skin rash/lesions?: No Itching?: No  Eyes Blurred vision?: No Double vision?: No  Ears/Nose/Throat Sore throat?: No Sinus problems?: No  Hematologic/Lymphatic Swollen glands?: No Easy bruising?: No  Cardiovascular Leg swelling?: No Chest pain?: No  Respiratory Cough?: No Shortness of breath?: No  Endocrine Excessive thirst?: No  Musculoskeletal Back pain?: No Joint pain?: No  Neurological Headaches?: No Dizziness?: No  Psychologic Depression?: No Anxiety?: No  Physical Exam: BP (!) 142/79   Pulse (!) 55   Ht 6\' 2"  (1.88 m)   Wt 239 lb (108.4 kg)   BMI 30.69 kg/m    Constitutional: Well nourished. Alert and oriented, No acute distress. HEENT: [place] AT, moist mucus membranes. Trachea midline, no masses. Cardiovascular: No clubbing, cyanosis, or edema. Respiratory: Normal respiratory effort, no increased work of breathing. Rectal: Patient with  normal sphincter tone.  No rectal masses are appreciated. Prostate is approximately 50 grams, no nodules are appreciated. Skin: No rashes, bruises or suspicious lesions. Neurologic: Grossly intact, no focal deficits, moving all 4 extremities. Psychiatric: Normal mood and affect.  Laboratory Data:  Lab Results  Component Value Date   CREATININE 0.80 07/20/2015    PSA history  8.8 ng/mL on 05/18/2015, biopsy at that time noted low risk Gleason 3+3 prostate cancer  9.3 ng/mL on 10/06/2015  10.2 ng/mL on 01/26/2016              14.6 ng/mL on 08/19/16     Assessment & Plan:    1. Prostate cancer:   Patient diagnosed with low risk Gleason 3+3 prostate cancer, cT1cNxMx, PSA 8.8, TRUS 68cc in 07/216.  Repeat biopsy 3+3 on 03/2016 with stable disease/ chronic inflammation.  PSA slowly rising which is somewhat concerning. Discussed these findings today with the patient in detail.  If his PSA continues to climb, I would recommend a prostate MRI to assess possible pathology and the central zone on the anterior side of the prostate which is not well sampled a transrectal biopsy.  He'll follow-up in 3-4 months for repeat PSA DRE and we'll reassess need for prostate MRI at that time.   Return in about 4 months (around 12/24/2016) for PSA/ DRE.  These notes generated with voice recognition software. I apologize for typographical errors.  Hollice Espy, MD  [facility] [address], [place], [place] [address] [phone]

## 2016-12-23 ENCOUNTER — Other Ambulatory Visit: Payer: Medicare Other

## 2016-12-23 ENCOUNTER — Other Ambulatory Visit: Payer: Self-pay

## 2016-12-23 DIAGNOSIS — C61 Malignant neoplasm of prostate: Secondary | ICD-10-CM

## 2016-12-24 LAB — PSA: PROSTATE SPECIFIC AG, SERUM: 16.8 ng/mL — AB (ref 0.0–4.0)

## 2016-12-25 ENCOUNTER — Encounter: Payer: Self-pay | Admitting: Urology

## 2016-12-25 ENCOUNTER — Ambulatory Visit (INDEPENDENT_AMBULATORY_CARE_PROVIDER_SITE_OTHER): Payer: Medicare Other | Admitting: Urology

## 2016-12-25 VITALS — BP 142/83 | HR 51 | Ht 74.0 in | Wt 237.0 lb

## 2016-12-25 DIAGNOSIS — C61 Malignant neoplasm of prostate: Secondary | ICD-10-CM

## 2016-12-25 NOTE — Progress Notes (Signed)
12/25/2016 4:05 PM   John [name] 1941/07/12 [id number]  Referring provider: Juluis Pitch, MD [phone] 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  Background history He was initially referred for further workup of an elevated PSA to 8.8, normal DRE s/p prostate biopsy on 05/12/15 demonstrating Gleason 3+3 involving 1 of 12 cores, 32% involvement. TRUS volume 68 cc with slight intravesical protrusion of the median lobe.   Repeat biopsy on 04/11/16 showed 1 core Gleason 3+3, 5% with evidence of chronic inflammation.    Overall PSA trend as below. His most recent PSA on 12/23/2016 was up again to 16.8 from 14.64 months ago. He notes that he has been quite ill over the past several weeks.   He is currently not having any irritative and/or obstructive voiding symptoms. He also denies ED. He has not experienced any gross hematuria, weight loss, bone pain, fevers, chills and/or N/V. His father was diagnosed with PCa when he was in his late 5s but died in his 45s of other causes.    PMH: Past Medical History:  Diagnosis Date  . Cancer ([place])   . Depression   . Depression   . 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 Eczema   . Elevated PSA   . H/O Osgood-Schlatter disease   . Hand deformity, congenital   . Hearing loss bilateral  . Hemorrhoids   . Horner syndrome   . Hypertension   . Migraine   . Obesity   . Rosacea   . Sleep apnea     Surgical History: Past Surgical History:  Procedure Laterality Date  . ANKLE ARTHROSCOPY    . CHOLECYSTECTOMY    . RETINAL DETACHMENT SURGERY    . SUBDURAL HEMATOMA EVACUATION VIA CRANIOTOMY    . THUMB FUSION    . TREATMENT FISTULA ANAL      Home Medications:  Allergies as of 12/25/2016   No Known Allergies     Medication List       Accurate as of 12/25/16  4:05 PM. Always use your most recent med  list.          aspirin EC 81 MG tablet Take by mouth.   atenolol 50 MG tablet Commonly known as:  TENORMIN Take 50 mg by mouth daily.   levETIRAcetam 500 MG tablet Commonly known as:  KEPPRA Take by mouth.   rizatriptan 10 MG tablet Commonly known as:  MAXALT   venlafaxine XR 75 MG 24 hr capsule Commonly known as:  EFFEXOR-XR TAKE ONE CAPSULE BY MOUTH ONCE A DAY       Allergies: No Known Allergies  Family History: Family History  Problem Relation Age of Onset  . Osteoporosis Mother   . Depression Mother   . Prostate cancer Father   . Hypertension Father   . Kidney disease Neg Hx     Social History:  reports that he has never smoked. He has never used smokeless tobacco. He reports that he drinks alcohol. He reports that he does not use drugs.  ROS: UROLOGY Frequent Urination?: No Hard to postpone urination?: No Burning/pain with urination?: No Get up at night to urinate?: Yes Leakage of urine?: No Urine stream starts and stops?: No Trouble starting stream?: No Do you have to strain to urinate?: No Blood in urine?: No Urinary tract infection?: No Sexually transmitted disease?: No Injury to kidneys or  bladder?: No Painful intercourse?: No Weak stream?: No Erection problems?: No Penile pain?: No  Gastrointestinal Nausea?: No Vomiting?: No Indigestion/heartburn?: No Diarrhea?: Yes Constipation?: No  Constitutional Fever: No Night sweats?: No Weight loss?: No Fatigue?: No  Skin Skin rash/lesions?: No Itching?: No  Eyes Blurred vision?: No Double vision?: No  Ears/Nose/Throat Sore throat?: No Sinus problems?: No  Hematologic/Lymphatic Swollen glands?: No Easy bruising?: No  Cardiovascular Leg swelling?: No Chest pain?: No  Respiratory Cough?: No Shortness of breath?: No  Endocrine Excessive thirst?: No  Musculoskeletal Back pain?: Yes Joint pain?: No  Neurological Headaches?: No Dizziness?:  No  Psychologic Depression?: No Anxiety?: No  Physical Exam: BP (!) 142/83   Pulse (!) 51   Ht 6\' 2"  (1.88 m)   Wt 237 lb (107.5 kg)   BMI 30.43 kg/m   Constitutional: Well nourished. Alert and oriented, No acute distress. HEENT: [place] AT, moist mucus membranes. Trachea midline, no masses. Cardiovascular: No clubbing, cyanosis, or edema. Respiratory: Normal respiratory effort, no increased work of breathing. Rectal: Patient with  normal sphincter tone.  No rectal masses are appreciated. Prostate is approximately 50 grams, no nodules are appreciated. Skin: No rashes, bruises or suspicious lesions. Neurologic: Grossly intact, no focal deficits, moving all 4 extremities. Psychiatric: Normal mood and affect.  Laboratory Data:  Lab Results  Component Value Date   CREATININE 0.80 07/20/2015    PSA history  8.8 ng/mL on 05/18/2015, biopsy at that time noted low risk Gleason 3+3 prostate cancer  9.3 ng/mL on 10/06/2015  10.2 ng/mL on 01/26/2016              14.6 ng/mL on 08/19/16             16.8 ng/mL on 12/23/16    Assessment & Plan:    1. Prostate cancer:   Patient diagnosed with low risk Gleason 3+3 prostate cancer, cT1cNxMx, PSA 8.8, TRUS 68cc in 07/216.  Repeat biopsy 3+3 on 03/2016 with stable disease/ chronic inflammation.  PSA slowly rising which is somewhat concerning. Discussed these findings today with the patient in detail.  Given that he's been ill and does have a history of chronic inflammation, he would like to continue to follow his PSA for 1 additional 3 month period and repeat. I will call him with these results.   If his PSA continues to climb, I would strongly recommend a prostate MRI to assess possible pathology and the central zone on the anterior side of the prostate which is not well sampled a transrectal biopsy.   Return in about 3 months (around 03/24/2017) for PSA  lab only (will call with result). Hollice Espy, MD  [facility] [address], [place], [place] [address] [phone]

## 2017-01-29 ENCOUNTER — Other Ambulatory Visit: Payer: Self-pay | Admitting: Surgery

## 2017-01-29 DIAGNOSIS — I7771 Dissection of carotid artery: Secondary | ICD-10-CM

## 2017-02-19 ENCOUNTER — Encounter: Payer: Self-pay | Admitting: Surgery

## 2017-02-26 ENCOUNTER — Encounter: Payer: Self-pay | Admitting: Surgery

## 2017-02-26 ENCOUNTER — Ambulatory Visit (HOSPITAL_COMMUNITY)
Admission: RE | Admit: 2017-02-26 | Discharge: 2017-02-26 | Disposition: A | Discharge status: Home / Self Care | Payer: Medicare Other | Source: Ambulatory Visit | Attending: Surgery | Admitting: Surgery

## 2017-02-26 ENCOUNTER — Ambulatory Visit (INDEPENDENT_AMBULATORY_CARE_PROVIDER_SITE_OTHER): Payer: Medicare Other | Admitting: Surgery

## 2017-02-26 VITALS — BP 149/90 | HR 53 | Temp 97.6°F | Resp 20 | Ht 74.0 in | Wt 239.8 lb

## 2017-02-26 DIAGNOSIS — I7771 Dissection of carotid artery: Secondary | ICD-10-CM | POA: Insufficient documentation

## 2017-02-26 DIAGNOSIS — I6523 Occlusion and stenosis of bilateral carotid arteries: Secondary | ICD-10-CM | POA: Diagnosis not present

## 2017-02-26 LAB — VAS US CAROTID
LCCAPSYS: 157 cm/s
LEFT ECA DIAS: -10 cm/s
LEFT VERTEBRAL DIAS: 12 cm/s
LICADDIAS: -18 cm/s
Left CCA dist dias: -12 cm/s
Left CCA dist sys: -64 cm/s
Left CCA prox dias: 22 cm/s
Left ICA dist sys: -72 cm/s
Left ICA prox dias: 9 cm/s
Left ICA prox sys: 41 cm/s
RCCADSYS: -67 cm/s
RCCAPDIAS: 14 cm/s
RIGHT CCA MID DIAS: 11 cm/s
RIGHT ECA DIAS: -8 cm/s
RIGHT VERTEBRAL DIAS: -11 cm/s
Right CCA prox sys: 125 cm/s

## 2017-02-26 NOTE — Progress Notes (Signed)
[facility]  Patient name: John [name] MRN: [id number] DOB: [birth year] Sex: male   REFERRING PROVIDER:    Dr. Lovie Macadamia   REASON FOR CONSULT:    Carotid dissection  HISTORY OF PRESENT ILLNESS:   John [name] is a 76 y.o. male, who is Referred today for follow-up of a carotid dissection.  The patient states that he began having symptoms of a drooping right eyelid approximate 1-2 years ago.  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  He was treated by vascular surgery in [place].  He was managed with 6 months of Plavix which was then transitioned to 81 mg of aspirin.  His symptoms have completely gone away.  He has not had any new events.  When he had his initial dissection, there were no associated issues of trauma or severe hypertension.  The patient does have a history of a epidural hematoma when he was in his childhood.  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  He has been diagnosed with a seizure disorder and is on Keppra he does suffer from hypertension which is well-controlled.  He is a nonsmoker.  PAST MEDICAL HISTORY    Past Medical History:  Diagnosis Date  . Cancer ([place])   . Depression   . Depression   . 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 Eczema   . Elevated PSA   . H/O Osgood-Schlatter disease   . Hand deformity, congenital   . Hearing loss bilateral  . Hemorrhoids   . Horner syndrome   . Hypertension   . Migraine   . Obesity   . Rosacea   . Sleep apnea      FAMILY HISTORY   Family History  Problem Relation Age of Onset  . Osteoporosis Mother   . Depression Mother   . Prostate cancer Father   . Hypertension Father   . Kidney disease Neg Hx     SOCIAL HISTORY:   Social History   Social History  . Marital status: Married    Spouse name: N/A  . Number of children:  N/A  . Years of education: N/A   Occupational History  . Not on file.   Social History Main Topics  . Smoking status: Never Smoker  . Smokeless tobacco: Never Used  . Alcohol use 0.0 oz/week     Comment: occ  . Drug use: No  . Sexual activity: Not on file   Other Topics Concern  . Not on file   Social History Narrative  . No narrative on file    ALLERGIES:    No Known Allergies  CURRENT MEDICATIONS:    Current Outpatient Prescriptions  Medication Sig Dispense Refill  . aspirin EC 81 MG tablet Take by mouth.    Marland Kitchen atenolol (TENORMIN) 50 MG tablet Take 50 mg by mouth daily.  0  . rizatriptan (MAXALT) 10 MG tablet     . venlafaxine XR (EFFEXOR-XR) 75 MG 24 hr capsule TAKE ONE CAPSULE BY MOUTH ONCE A DAY  0  . levETIRAcetam (KEPPRA) 500 MG tablet Take by mouth.     No current facility-administered medications for this visit.     REVIEW OF SYSTEMS:   [X]  denotes positive finding, [ ]  denotes negative finding Cardiac  Comments:  Chest pain or chest pressure:    Shortness of breath upon exertion:    Short of  breath when lying flat:    Irregular heart rhythm:        Vascular    Pain in calf, thigh, or hip brought on by ambulation:    Pain in feet at night that wakes you up from your sleep:     Blood clot in your veins:    Leg swelling:         Pulmonary    Oxygen at home:    Productive cough:     Wheezing:         Neurologic    Sudden weakness in arms or legs:     Sudden numbness in arms or legs:     Sudden onset of difficulty speaking or slurred speech:    Temporary loss of vision in one eye:     Problems with dizziness:         Gastrointestinal    Blood in stool:      Vomited blood:         Genitourinary    Burning when urinating:     Blood in urine:        Psychiatric    Major depression:         Hematologic    Bleeding problems:    Problems with blood clotting too easily:        Skin    Rashes or ulcers:        Constitutional    Fever  or chills:     PHYSICAL EXAM:   Vitals:   02/26/17 0943 02/26/17 0944  BP: (!) 154/94 (!) 149/90  Pulse: (!) 53   Resp: 20   Temp: 97.6 F (36.4 C)   TempSrc: Oral   SpO2: 99%   Weight: 239 lb 12.8 oz (108.8 kg)   Height: 6\' 2"  (1.88 m)     GENERAL: The patient is a well-nourished male, in no acute distress. The vital signs are documented above. CARDIAC: There is a regular rate and rhythm.  VASCULAR: No carotid bruits.  Palpable pedal pulses. PULMONARY: Nonlabored respirations ABDOMEN: Soft and non-tender .  Umbilical hernia MUSCULOSKELETAL: There are no major deformities or cyanosis. NEUROLOGIC: No focal weakness or paresthesias are detected. SKIN: There are no ulcers or rashes noted. PSYCHIATRIC: The patient has a normal affect.  STUDIES:   Carotid duplex was ordered and reviewed today.  This shows no evidence of dissection, and no significant stenosis  ASSESSMENT and PLAN   Spontaneous carotid dissection: By ultrasound, the patient's carotid artery has healed.  He does not have any evidence of carotid stenosis.  He has been appropriately treated with Plavix, and has now transitioned to 81 mg of aspirin on a daily basis.  He has no residual symptoms.  He has not had any recurrent symptoms.  I suspect given his history of subdural spontaneous hematoma and spontaneous carotid dissection that he may have some underlying vasculitis. Discussed this with him.  I do not feel that he needs to undergo testing for this as it would not change management strategy, and likely could be nondiagnostic.  I would agree with continuing a daily aspirin and annual carotid duplex surveillance.  He will follow up with me again in 1 year with a repeat carotid ultrasound.   Annamarie Major, MD [facility]ists of [facility] [phone] Pager [phone]

## 2017-02-27 NOTE — Addendum Note (Signed)
Addended by: Lianne Cure A on: 02/27/2017 09:44 AM   Modules accepted: Orders

## 2017-03-26 ENCOUNTER — Other Ambulatory Visit: Payer: Medicare Other

## 2017-03-26 DIAGNOSIS — C61 Malignant neoplasm of prostate: Secondary | ICD-10-CM

## 2017-03-27 LAB — PSA: Prostate Specific Ag, Serum: 17.7 ng/mL — ABNORMAL HIGH (ref 0.0–4.0)

## 2017-04-16 ENCOUNTER — Telehealth: Payer: Self-pay | Admitting: Urology

## 2017-04-16 DIAGNOSIS — C61 Malignant neoplasm of prostate: Secondary | ICD-10-CM

## 2017-04-16 MED ORDER — DIAZEPAM 10 MG PO TABS: 10.0000 mg | ORAL_TABLET | Freq: Once | ORAL | 0 refills | Status: AC

## 2017-04-16 NOTE — Telephone Encounter (Signed)
Call patient to discuss most recent PSA results. Unfortunate, his PSA continues to rise now up to 17.7.  I'm concerned that he may have a higher grade lesion which is not being identified due to possible sampling error.  I have recommended following up with the endorectal MRI to assess whether there is any high grade lesions.  If this lesion is identified, may recommend fusion guided biopsy.  All questions answered. He is agreeable with this plan. He is tolerated MRIs in the past. We discussed the possibility of Valium and he declined.  Will arrange follow-up after his MRI is complete.  Hollice Espy, MD

## 2017-04-17 NOTE — Telephone Encounter (Signed)
Instructions gone over with the patient on the phone. He has declined the valium, I called and spoke with Wells Guiles at [facility] and let her know that he would  Not be coming with one and she said she would document his chart to reflect this.  Mailed the instructions to the patient.  Sharyn Lull

## 2017-04-22 ENCOUNTER — Encounter: Payer: Self-pay | Admitting: Dietician

## 2017-04-22 ENCOUNTER — Encounter: Payer: Medicare Other | Attending: Family Medicine | Admitting: Dietician

## 2017-04-22 VITALS — Ht 74.0 in | Wt 238.3 lb

## 2017-04-22 DIAGNOSIS — E6609 Other obesity due to excess calories: Secondary | ICD-10-CM

## 2017-04-22 DIAGNOSIS — Z713 Dietary counseling and surveillance: Secondary | ICD-10-CM | POA: Insufficient documentation

## 2017-04-22 DIAGNOSIS — Z6830 Body mass index (BMI) 30.0-30.9, adult: Secondary | ICD-10-CM | POA: Diagnosis not present

## 2017-04-22 DIAGNOSIS — E669 Obesity, unspecified: Secondary | ICD-10-CM | POA: Insufficient documentation

## 2017-04-22 NOTE — Progress Notes (Signed)
Medical Nutrition Therapy: Visit start time: 10:40  end time: 11:45am Assessment:  Diagnosis: obesity  Past medical history: hypertension, hx of migraines; hx of depression Psychosocial issues/ stress concerns: none identified Preferred learning method:  . Auditory . Visual Current weight: 238.3 lbs Height: 74" Medications, supplements: see list Progress and evaluation:  Patient in for initial medical nutrition therapy appointment. He reports he has been overweight for 20-25 years. He reports relatively stable weight in the past year. He brought a Paleo diet meal plan that he and his wife have been following  which is a low carbohydrate, high protein diet.He eats 3 meals per day 3-5 hours apart. He has not had success with weight loss and admits, he adheres to the  meals well but does excessive snacking in the evenings. His present diet is low in whole grains and does not meet basic recommendation of 5 servings of fruits/vegetables daily. His diet is also low in calcium sources. Patient expressed frustration regarding his excessive belly weight. Physical activity: none   Nutrition Care Education:    Weight control:  Discussed the positive aspects of the Paleo diet but encouraged a more balanced pattern that includes whole grains and other healthy carbohydrate sources. Instructed on a meal plan based on a minimum of 1800 calories including carbohydrate counting and how to better balance carbohydrate, protein and non-starchy vegetables. Encouraged to focus on changing the main problem area of evening snacking acknowledging that over-restriction during the day could be contributing to excessive snacking at night.Discussed snack options. Also, discussed importance of exercise with weight loss efforts and discussed options.  Nutritional Diagnosis:  Orestes-3.3 Overweight/obesity As related to excessive calories in the evening and lack of physical activity.  As evidenced by diet and exercise  history.. NI-5.11.1 Predicted suboptimal nutrient intake As related to absence of grains and low intake of fruits/vegetables.  As evidenced by diet history..  Intervention:  Balance meals with protein (8 oz/day), 2-4 servings of carbohydrate (fruit, starch,yogurt) and non-starchy vegetables. Include at least 1 serving of starch at meals. Spread food  out more over the day to help decrease evening snacking. Try oatmeal at night for a snack or make a snack plate of 1/4 cup nuts, fruit and vegetables. Exercise goal: 2-3 x/week for 10-15 miles.  Education Materials given:  . Plate Planner . Food lists/ Planning A Balanced Meal . Sample meal pattern/ menus . Goals/ instructions Learner/ who was taught:  . Patient  Level of understanding: . Partial understanding; needs review/ practice Demonstrated degree of understanding via:   Teach back Learning barriers: . None Willingness to learn/ readiness for change: . Acceptance, ready for change Monitoring and Evaluation:  Dietary intake, exercise, , and body weight      follow up: 05/20/17 at 9:00am

## 2017-04-22 NOTE — Patient Instructions (Signed)
Balance meals with protein (8 oz/day), 2-4 servings of carbohydrate (fruit, starch,yogurt) and non-starchy vegetables. Include at least 1 serving of starch at meals. Spread food  out more over the day to help decrease evening snacking. Try oatmeal at night for a snack or make a snack plate of 1/4 cup nuts, fruit and vegetables. Exercise goal: 2-3 x/week for 10-15 miles.

## 2017-05-20 ENCOUNTER — Ambulatory Visit: Payer: Medicare Other | Admitting: Dietician

## 2017-05-21 ENCOUNTER — Ambulatory Visit (HOSPITAL_COMMUNITY)
Admission: RE | Admit: 2017-05-21 | Discharge: 2017-05-21 | Disposition: A | Discharge status: Home / Self Care | Payer: Medicare Other | Source: Ambulatory Visit | Attending: Urology | Admitting: Urology

## 2017-05-21 DIAGNOSIS — C61 Malignant neoplasm of prostate: Secondary | ICD-10-CM | POA: Insufficient documentation

## 2017-05-21 LAB — POCT I-STAT CREATININE: CREATININE: 0.8 mg/dL (ref 0.61–1.24)

## 2017-05-21 MED ORDER — GADOBENATE DIMEGLUMINE 529 MG/ML IV SOLN
20.0000 mL | Freq: Once | INTRAVENOUS | Status: AC | PRN
  Administered 2017-05-21: 20 mL via INTRAVENOUS

## 2017-05-21 MED ORDER — LIDOCAINE HCL 2 % EX GEL: 1.0000 "application " | Freq: Once | CUTANEOUS | Status: DC

## 2017-05-21 MED ORDER — LIDOCAINE HCL 2 % EX GEL
CUTANEOUS | Status: AC
  Filled 2017-05-21: qty 30

## 2017-05-23 ENCOUNTER — Telehealth: Payer: Self-pay | Admitting: Urology

## 2017-05-23 NOTE — Telephone Encounter (Signed)
Discussed MRI findings. Given his rising PSA and suspicious low volume lesion, I recommended consideration of a fusion biopsy. We've previously discussed was the office. All questions are answered. The patient is agreeable this plan.  We will refer him to [facility] for this procedure and he'll return back once that biopsy is been completed.  Hollice Espy, MD

## 2017-05-27 ENCOUNTER — Encounter: Payer: Self-pay | Admitting: Dietician

## 2017-05-27 NOTE — Telephone Encounter (Signed)
Faxed referral over to Tammy at [facility] and she will schedule and contact the patient with the appointment.  Sharyn Lull

## 2017-06-03 ENCOUNTER — Telehealth: Payer: Self-pay | Admitting: Urology

## 2017-06-03 NOTE — Telephone Encounter (Signed)
Patient is scheduled for his fusion biopsy on 06-05-17 with Dr. Tresa Moore in [facility].  Thanks, Sharyn Lull

## 2017-09-02 ENCOUNTER — Other Ambulatory Visit: Payer: Self-pay | Admitting: Urology

## 2017-09-02 ENCOUNTER — Ambulatory Visit (INDEPENDENT_AMBULATORY_CARE_PROVIDER_SITE_OTHER): Payer: Medicare Other | Admitting: Urology

## 2017-09-02 ENCOUNTER — Encounter: Payer: Self-pay | Admitting: Urology

## 2017-09-02 VITALS — BP 122/70 | HR 58 | Ht 74.0 in | Wt 235.0 lb

## 2017-09-02 DIAGNOSIS — C61 Malignant neoplasm of prostate: Secondary | ICD-10-CM | POA: Diagnosis not present

## 2017-09-02 MED ORDER — FINASTERIDE 5 MG PO TABS: 5.0000 mg | ORAL_TABLET | Freq: Every day | ORAL | 11 refills | Status: DC

## 2017-09-02 NOTE — Progress Notes (Signed)
09/02/2017 7:24 PM   John [name] [birth year] [id number]  Referring provider: Juluis Pitch, MD 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  He recently underwent an MRI fusion biopsy return to the office today to discuss these results.and returns to the office today to discuss these results.    Background history He was initially referred for further workup of an elevated PSA to 8.8, normal DRE s/p prostate biopsy on 05/12/15 demonstrating Gleason 3+3 involving 1 of 12 cores, 32% involvement. TRUS volume 68 cc with slight intravesical protrusion of the median lobe.   Repeat biopsy on 04/11/16 showed 1 core Gleason 3+3, 5% with evidence of chronic inflammation.    His PSA continued to trend upwards.  PSA trend as below.  Most recent PSA 17.7 on 02/2017.  Given concern for possible progression of disease, he underwent a prostate MRI.  This showed a lateral apical signal abnormality, PI-RADS 2/3 as well as a more equivocal small volume signal within the medial left apex, PI-RADS 2.  He is now status post MRI fusion biopsy performed at [facility] by Dr. Tresa Moore on 05/2017 which showed 3 cores of 3+3 bilaterally, up to 20 % of tissue.               He is currently not having any irritative and/or obstructive voiding symptoms. He also denies ED. He has not experienced any gross hematuria, weight loss, bone pain, fevers, chills and/or N/V. His father was diagnosed with PCa when he was in his late 93s but died in his 71s of other causes.    PMH: Past Medical History:  Diagnosis Date  . Cancer ([place])   . Depression   . Depression   . 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 Eczema   . Elevated PSA   . H/O Osgood-Schlatter disease   . Hand deformity, congenital   . Hearing loss bilateral  . Hemorrhoids   . Horner syndrome   .  Hypertension   . Migraine   . Obesity   . Rosacea   . Sleep apnea     Surgical History: Past Surgical History:  Procedure Laterality Date  . ANKLE ARTHROSCOPY    . CHOLECYSTECTOMY    . RETINAL DETACHMENT SURGERY    . SUBDURAL HEMATOMA EVACUATION VIA CRANIOTOMY    . THUMB FUSION    . TREATMENT FISTULA ANAL      Home Medications:  Allergies as of 09/02/2017   No Known Allergies     Medication List        Accurate as of 09/02/17 11:59 PM. Always use your most recent med list.          aspirin EC 81 MG tablet Take by mouth.   atenolol 50 MG tablet Commonly known as:  TENORMIN Take 50 mg by mouth daily.   finasteride 5 MG tablet Commonly known as:  PROSCAR Take 1 tablet (5 mg total) daily by mouth.   levETIRAcetam 500 MG tablet Commonly known as:  KEPPRA Take by mouth.   levETIRAcetam 500 MG tablet Commonly known as:  KEPPRA TAKE 1 TABLET (500 MG TOTAL) BY MOUTH ONCE DAILY.   rizatriptan 10 MG tablet Commonly known as:  MAXALT   venlafaxine XR 75 MG 24 hr capsule Commonly known as:  EFFEXOR-XR TAKE ONE CAPSULE BY MOUTH ONCE A DAY  Allergies: No Known Allergies  Family History: Family History  Problem Relation Age of Onset  . Osteoporosis Mother   . Depression Mother   . Prostate cancer Father   . Hypertension Father   . Kidney disease Neg Hx     Social History:  reports that  has never smoked. he has never used smokeless tobacco. He reports that he drinks alcohol. He reports that he does not use drugs.  ROS: UROLOGY Frequent Urination?: No Hard to postpone urination?: No Burning/pain with urination?: No Get up at night to urinate?: No Leakage of urine?: No Urine stream starts and stops?: No Trouble starting stream?: No Do you have to strain to urinate?: No Blood in urine?: No Urinary tract infection?: No Sexually transmitted disease?: No Injury to kidneys or bladder?: No Painful intercourse?: No Weak stream?: No Erection  problems?: No Penile pain?: No  Gastrointestinal Nausea?: No Vomiting?: No Indigestion/heartburn?: No Diarrhea?: No Constipation?: No  Constitutional Fever: No Night sweats?: No Weight loss?: No Fatigue?: No  Skin Skin rash/lesions?: No Itching?: No  Eyes Blurred vision?: No Double vision?: No  Ears/Nose/Throat Sore throat?: No Sinus problems?: No  Hematologic/Lymphatic Swollen glands?: No Easy bruising?: No  Cardiovascular Leg swelling?: No Chest pain?: No  Respiratory Cough?: No Shortness of breath?: No  Endocrine Excessive thirst?: No  Musculoskeletal Back pain?: No Joint pain?: No  Neurological Headaches?: No Dizziness?: No  Psychologic Depression?: No Anxiety?: No  Physical Exam: BP 122/70   Pulse (!) 58   Ht 6\' 2"  (1.88 m)   Wt 235 lb (106.6 kg)   BMI 30.17 kg/m   Constitutional: Well nourished. Alert and oriented, No acute distress. HEENT: [place] AT, moist mucus membranes. Trachea midline, no masses. Cardiovascular: No clubbing, cyanosis, or edema. Respiratory: Normal respiratory effort, no increased work of breathing. Rectal: Deferred today in light of recent repeat biopsy Skin: No rashes, bruises or suspicious lesions. Neurologic: Grossly intact, no focal deficits, moving all 4 extremities. Psychiatric: Normal mood and affect.  Laboratory Data:  Lab Results  Component Value Date   CREATININE 0.80 05/21/2017    PSA history  8.8 ng/mL on 05/18/2015, biopsy at that time noted low risk Gleason 3+3 prostate cancer  9.3 ng/mL on 10/06/2015  10.2 ng/mL on 01/26/2016              14.6 ng/mL on 08/19/16             16.8 ng/mL on 12/23/16  17.2 ng/mL on 03/26/17  PSA repeated today and is pending  Assessment & Plan:    1. Prostate cancer:   Patient diagnosed with low risk Gleason 3+3 prostate cancer, cT1cNxMx, PSA 8.8, TRUS 68cc in 07/216.    Repeat biopsy 3+3 on 03/2016 with stable disease/ chronic inflammation and most recently  MRI fusion biopsy which is essentially stable.    PSA repeated today   Overall rising PSA trend is somewhat concerning.  PSA was repeated today.  If it continues to rise, I would recommend restaging scans bone scan with plans for treatment.   Alternatively, we could  use finasteride as a diagnostic tool and see if his PSA drops.  We will plan to recheck his PSA 3 months to ensure PSA is falling.    He is most interested in initiating finasteride therapy with very close follow up..     Return in about 3 months (around 12/03/2017) for PSA / DRE.\ Hollice Espy, MD  [facility] [address] [place],  [place] [address] [phone]

## 2017-09-03 LAB — PSA: PROSTATE SPECIFIC AG, SERUM: 21.1 ng/mL — AB (ref 0.0–4.0)

## 2017-09-05 ENCOUNTER — Telehealth: Payer: Self-pay | Admitting: Urology

## 2017-09-05 NOTE — Telephone Encounter (Signed)
Call patient to discuss rising PSA.  PSA now up to 21 and is steadily since 2016.  As discussed in the office, will try finasteride for 3 months.  If his PSA fails to decline dramatically as anticipated, would strongly recommend treatment of this cancer.  All questions answered.    Finasteride sent to pharmacy.  Please schedule follow-up with me in 3 months with PSA just prior to the appointment.  Hollice Espy, MD

## 2017-09-08 ENCOUNTER — Telehealth: Payer: Self-pay | Admitting: Urology

## 2017-09-08 NOTE — Telephone Encounter (Signed)
done

## 2017-10-13 ENCOUNTER — Other Ambulatory Visit: Payer: Self-pay

## 2017-10-13 ENCOUNTER — Emergency Department
Admission: EM | Admit: 2017-10-13 | Discharge: 2017-10-13 | Disposition: A | Discharge status: Home / Self Care | Payer: Medicare Other | Attending: Emergency Medicine | Admitting: Emergency Medicine

## 2017-10-13 ENCOUNTER — Encounter: Payer: Self-pay | Admitting: Emergency Medicine

## 2017-10-13 DIAGNOSIS — Y939 Activity, unspecified: Secondary | ICD-10-CM | POA: Insufficient documentation

## 2017-10-13 DIAGNOSIS — Z79899 Other long term (current) drug therapy: Secondary | ICD-10-CM | POA: Insufficient documentation

## 2017-10-13 DIAGNOSIS — T162XXA Foreign body in left ear, initial encounter: Secondary | ICD-10-CM | POA: Insufficient documentation

## 2017-10-13 DIAGNOSIS — Y999 Unspecified external cause status: Secondary | ICD-10-CM | POA: Diagnosis not present

## 2017-10-13 DIAGNOSIS — W228XXA Striking against or struck by other objects, initial encounter: Secondary | ICD-10-CM | POA: Diagnosis not present

## 2017-10-13 DIAGNOSIS — Z7982 Long term (current) use of aspirin: Secondary | ICD-10-CM | POA: Diagnosis not present

## 2017-10-13 DIAGNOSIS — I10 Essential (primary) hypertension: Secondary | ICD-10-CM | POA: Diagnosis not present

## 2017-10-13 DIAGNOSIS — Y929 Unspecified place or not applicable: Secondary | ICD-10-CM | POA: Diagnosis not present

## 2017-10-13 NOTE — ED Provider Notes (Signed)
[facility] Emergency Department Provider Note  ____________________________________________  Time seen: Approximately 10:04 PM  I have reviewed the triage vital signs and the nursing notes.   HISTORY  Chief Complaint Foreign Body in Ear    HPI John [name] is a 76 y.o. male resents emergency department complaining of foreign body in his left ear.  Patient reports that the wax guard to his left side hearing aid remain lodged in his ear after taking up here J.  Patient tried to flush his ear out at home could not dislodge same.  Patient denies any pain, hearing loss or other injury or complaint.  No medications prior to arrival.  Past Medical History:  Diagnosis Date  . Cancer ([address])   . Depression   . Depression   . 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 Eczema   . Elevated PSA   . H/O Osgood-Schlatter disease   . Hand deformity, congenital   . Hearing loss bilateral  . Hemorrhoids   . Horner syndrome   . Hypertension   . Migraine   . Obesity   . Rosacea   . Sleep apnea     Patient Active Problem List   Diagnosis Date Noted  . History of seizure 06/21/2016  . Headache, migraine 12/22/2015  . 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 Malignant neoplasm of prostate ([place]) 06/19/2015  . Prostate cancer ([place]) 06/04/2015  . Clinical depression 06/13/2014  . BP (high blood pressure) 06/13/2014  . Apnea, sleep 06/13/2014  . Recurrent major depressive disorder, in partial remission ([place]) 06/13/2014    Past Surgical History:  Procedure Laterality Date  . ANKLE ARTHROSCOPY    . CHOLECYSTECTOMY    . RETINAL DETACHMENT SURGERY    . SUBDURAL HEMATOMA EVACUATION VIA CRANIOTOMY    . THUMB FUSION    . TREATMENT FISTULA ANAL      Prior to Admission medications   Medication Sig Start Date End Date Taking? Authorizing Provider  aspirin EC 81 MG tablet Take by mouth.    [provider]  atenolol (TENORMIN) 50 MG tablet Take 50  mg by mouth daily. 05/17/15   [provider]  finasteride (PROSCAR) 5 MG tablet Take 1 tablet (5 mg total) daily by mouth. 09/02/17   Hollice Espy, MD  levETIRAcetam (KEPPRA) 500 MG tablet Take by mouth. 12/01/15 05/29/16  [provider]  levETIRAcetam (KEPPRA) 500 MG tablet TAKE 1 TABLET (500 MG TOTAL) BY MOUTH ONCE DAILY. 06/24/17   [provider]  rizatriptan (MAXALT) 10 MG tablet  05/11/15   [provider]  venlafaxine XR (EFFEXOR-XR) 75 MG 24 hr capsule TAKE ONE CAPSULE BY MOUTH ONCE A DAY 09/04/15   [provider]    Allergies Patient has no known allergies.  Family History  Problem Relation Age of Onset  . Osteoporosis Mother   . Depression Mother   . Prostate cancer Father   . Hypertension Father   . Kidney disease Neg Hx     Social History Social History   Tobacco Use  . Smoking status: Never Smoker  . Smokeless tobacco: Never Used  Substance Use Topics  . Alcohol use: Yes    Alcohol/week: 0.0 oz    Comment: occ  . Drug use: No     Review of Systems  Constitutional: No fever/chills Eyes: No visual changes. No discharge ENT: Body to the left ear Cardiovascular: no chest pain. Respiratory: no cough. No SOB. Gastrointestinal: No abdominal pain.  No  nausea, no vomiting.   Musculoskeletal: Negative for musculoskeletal pain. Skin: Negative for rash, abrasions, lacerations, ecchymosis. Neurological: Negative for headaches, focal weakness or numbness. 10-point ROS otherwise negative.  ____________________________________________   PHYSICAL EXAM:  VITAL SIGNS: ED Triage Vitals  Enc Vitals Group     BP 10/13/17 2114 (!) 167/91     Pulse Rate 10/13/17 2114 62     Resp 10/13/17 2114 18     Temp 10/13/17 2114 97.8 F (36.6 C)     Temp Source 10/13/17 2114 Oral     SpO2 10/13/17 2114 98 %     Weight 10/13/17 2116 240 lb (108.9 kg)     Height 10/13/17 2116 6\' 2"  (1.88 m)     Head Circumference --      Peak Flow --       Pain Score --      Pain Loc --      Pain Edu? --      Excl. in [place]? --      Constitutional: Alert and oriented. Well appearing and in no acute distress. Eyes: Conjunctivae are normal. PERRL. EOMI. Head: Atraumatic. ENT:      Ears: EAC and TM on right is unremarkable.  Visualization of the EAC reveals black foreign body consistent with wax guard of hearing aid.  After removal of foreign body, visualization of the EAC and TM with no signs of laceration or penetration of the TM.      Nose: No congestion/rhinnorhea.      Mouth/Throat: Mucous membranes are moist.  Neck: No stridor.   Cardiovascular: Normal rate, regular rhythm. Normal S1 and S2.  Good peripheral circulation. Respiratory: Normal respiratory effort without tachypnea or retractions. Lungs CTAB. Good air entry to the bases with no decreased or absent breath sounds. Musculoskeletal: Full range of motion to all extremities. No gross deformities appreciated. Neurologic:  Normal speech and language. No gross focal neurologic deficits are appreciated.  Skin:  Skin is warm, dry and intact. No rash noted. Psychiatric: Mood and affect are normal. Speech and behavior are normal. Patient exhibits appropriate insight and judgement.   ____________________________________________   LABS (all labs ordered are listed, but only abnormal results are displayed)  Labs Reviewed - No data to display ____________________________________________  EKG   ____________________________________________  RADIOLOGY   No results found.  ____________________________________________    PROCEDURES  Procedure(s) performed:    .Foreign Body Removal Date/Time: 10/13/2017 10:16 PM Performed by: Darletta Moll, PA-C Authorized by: Darletta Moll, PA-C  Consent: Verbal consent obtained. Risks and benefits: risks, benefits and alternatives were discussed Consent given by: patient Patient understanding: patient states  understanding of the procedure being performed Required items: required blood products, implants, devices, and special equipment available Patient identity confirmed: verbally with patient Body area: ear Location details: left ear Localization method: ENT speculum Removal mechanism: alligator forceps Complexity: simple 1 objects recovered. Objects recovered: wax guard for hearing aid Post-procedure assessment: foreign body removed Patient tolerance: Patient tolerated the procedure well with no immediate complications      Medications - No data to display   ____________________________________________   INITIAL IMPRESSION / ASSESSMENT AND PLAN / ED COURSE  Pertinent labs & imaging results that were available during my care of the patient were reviewed by me and considered in my medical decision making (see chart for details).  Review of the  [place]RS was performed in accordance of the [place] prior to dispensing any controlled drugs.     Patient's diagnosis is  consistent with foreign body to left ear.  This is visualized and removed in the emergency department with no complications.  Patient tolerated well.  No prescriptions at this time.  Patient will follow-up with a cardiologist or primary care as needed..  Patient is given ED precautions to return to the ED for any worsening or new symptoms.     ____________________________________________  FINAL CLINICAL IMPRESSION(S) / ED DIAGNOSES  Final diagnoses:  Foreign body of left ear, initial encounter      NEW MEDICATIONS STARTED DURING THIS VISIT:  ED Discharge Orders    None          This chart was dictated using voice recognition software/Dragon. Despite best efforts to proofread, errors can occur which can change the meaning. Any change was purely unintentional.    Darletta Moll, PA-C 10/13/17 2218    Delman Kitten, MD 10/14/17 [phone]

## 2017-10-13 NOTE — ED Triage Notes (Signed)
Part of hearing aide in L ear.

## 2017-11-27 ENCOUNTER — Other Ambulatory Visit: Payer: Self-pay

## 2017-11-27 DIAGNOSIS — C61 Malignant neoplasm of prostate: Secondary | ICD-10-CM

## 2017-11-28 ENCOUNTER — Other Ambulatory Visit: Payer: Medicare Other

## 2017-11-28 DIAGNOSIS — C61 Malignant neoplasm of prostate: Secondary | ICD-10-CM

## 2017-11-29 LAB — PSA: PROSTATE SPECIFIC AG, SERUM: 13.8 ng/mL — AB (ref 0.0–4.0)

## 2017-12-02 ENCOUNTER — Encounter: Payer: Self-pay | Admitting: Urology

## 2017-12-02 ENCOUNTER — Ambulatory Visit (INDEPENDENT_AMBULATORY_CARE_PROVIDER_SITE_OTHER): Payer: Medicare Other | Admitting: Urology

## 2017-12-02 VITALS — BP 137/76 | HR 54 | Ht 74.0 in | Wt 240.0 lb

## 2017-12-02 DIAGNOSIS — C61 Malignant neoplasm of prostate: Secondary | ICD-10-CM

## 2017-12-02 NOTE — Progress Notes (Signed)
12/02/2017 5:03 PM   [name] Sep 14, 1941 [id number]  Referring provider: Juluis Pitch, MD [id number], [place] [address]   HPI: 77 year old Caucasian male with low-risk prostate cancer diagnosed in July 2016 on active surveillance who presents for follow up.   Background history He was initially referred for further workup of an elevated PSA to 8.8, normal DRE s/p prostate biopsy on 05/12/15 demonstrating Gleason 3+3 involving 1 of 12 cores, 32% involvement. TRUS volume 68 cc with slight intravesical protrusion of the median lobe.   Repeat biopsy on 04/11/16 showed 1 core Gleason 3+3, 5% with evidence of chronic inflammation.    His PSA continued to trend upwards.  PSA trend as below.  Most recent PSA 17.7 on 02/2017.  Given concern for possible progression of disease, he underwent a prostate MRI.  This showed a lateral apical signal abnormality, PI-RADS 2/3 as well as a more equivocal small volume signal within the medial left apex, PI-RADS 2.  He is now status post MRI fusion biopsy performed at [facility] by Dr. Tresa Moore on 05/2017 which showed 3 cores of 3+3 bilaterally, up to 20 % of tissue.     He was started on finasteride following his visit in 08/2017.  This is primarily for diagnostic purposes.  Since starting this medication, his PSA has come down from 21.13 months ago now down to 13.8.             He is currently not having any irritative and/or obstructive voiding symptoms. He also denies ED. He has not experienced any gross hematuria, weight loss, bone pain, fevers, chills and/or N/V. His father was diagnosed with PCa when he was in his late 28s but died in his 50s of other causes.     PMH: Past Medical History:  Diagnosis Date  . Cancer ([place])   . Depression   . Depression   . 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 Eczema   . Elevated PSA   . H/O Osgood-Schlatter disease   . Hand deformity, congenital   . Hearing loss  bilateral  . Hemorrhoids   . Horner syndrome   . Hypertension   . Migraine   . Obesity   . Rosacea   . Sleep apnea     Surgical History: Past Surgical History:  Procedure Laterality Date  . ANKLE ARTHROSCOPY    . CHOLECYSTECTOMY    . RETINAL DETACHMENT SURGERY    . SUBDURAL HEMATOMA EVACUATION VIA CRANIOTOMY    . THUMB FUSION    . TREATMENT FISTULA ANAL      Home Medications:  Allergies as of 12/02/2017   No Known Allergies     Medication List        Accurate as of 12/02/17 11:59 PM. Always use your most recent med list.          aspirin EC 81 MG tablet Take by mouth.   atenolol 50 MG tablet Commonly known as:  TENORMIN Take 50 mg by mouth daily.   finasteride 5 MG tablet Commonly known as:  PROSCAR Take 1 tablet (5 mg total) daily by mouth.   levETIRAcetam 500 MG tablet Commonly known as:  KEPPRA Take by mouth.   rizatriptan 10 MG tablet Commonly known as:  MAXALT   venlafaxine XR 75 MG 24 hr capsule Commonly known as:  EFFEXOR-XR TAKE ONE CAPSULE BY MOUTH ONCE A DAY       Allergies: No Known Allergies  Family History: Family  History  Problem Relation Age of Onset  . Osteoporosis Mother   . Depression Mother   . Prostate cancer Father   . Hypertension Father   . Kidney disease Neg Hx     Social History:  reports that  has never smoked. he has never used smokeless tobacco. He reports that he drinks alcohol. He reports that he does not use drugs.  ROS: UROLOGY Frequent Urination?: No Hard to postpone urination?: No Burning/pain with urination?: No Get up at night to urinate?: No Leakage of urine?: No Urine stream starts and stops?: No Trouble starting stream?: No Do you have to strain to urinate?: No Blood in urine?: No Urinary tract infection?: No Sexually transmitted disease?: No Injury to kidneys or bladder?: No Painful intercourse?: No Weak stream?: No Erection problems?: No Penile pain?: No  Gastrointestinal Nausea?:  No Vomiting?: No Indigestion/heartburn?: No Diarrhea?: No Constipation?: No  Constitutional Fever: No Night sweats?: No Weight loss?: No Fatigue?: No  Skin Skin rash/lesions?: No Itching?: No  Eyes Blurred vision?: No Double vision?: No  Ears/Nose/Throat Sore throat?: No Sinus problems?: No  Hematologic/Lymphatic Swollen glands?: No Easy bruising?: No  Cardiovascular Leg swelling?: No Chest pain?: No  Respiratory Cough?: No Shortness of breath?: No  Endocrine Excessive thirst?: No  Musculoskeletal Back pain?: No Joint pain?: No  Neurological Headaches?: No Dizziness?: No  Psychologic Depression?: No Anxiety?: No  Physical Exam: BP 137/76   Pulse (!) 54   Ht 6\' 2"  (1.88 m)   Wt 240 lb (108.9 kg)   BMI 30.81 kg/m   Constitutional: Well nourished. Alert and oriented, No acute distress. HEENT: [place] AT, moist mucus membranes. Trachea midline, no masses. Cardiovascular: No clubbing, cyanosis, or edema. Respiratory: Normal respiratory effort, no increased work of breathing. Skin: No rashes, bruises or suspicious lesions. Neurologic: Grossly intact, no focal deficits, moving all 4 extremities. Psychiatric: Normal mood and affect.  Laboratory Data:  Lab Results  Component Value Date   CREATININE 0.80 05/21/2017    PSA history  8.8 ng/mL on 05/18/2015, biopsy at that time noted low risk Gleason 3+3 prostate cancer  9.3 ng/mL on 10/06/2015  10.2 ng/mL on 01/26/2016              14.6 ng/mL on 08/19/16             16.8 ng/mL on 12/23/16  17.2 ng/mL on 03/26/17              21.1 ng/mL on 08/2017  --> finasteride started              13.8 on 11/2016  Assessment & Plan:    1. Prostate cancer:   Patient diagnosed with low risk Gleason 3+3 prostate cancer, cT1cNxMx, PSA 8.8, TRUS 68cc in 07/216.    Repeat biopsy 3+3 on 03/2016 with stable disease/ chronic inflammation and most recently MRI fusion biopsy which is essentially stable.    PSA is come  down appropriately with initiation of finasteride which is reassuring.  Will continue to follow closely.  Return for lab only 3 months, MD visit 6 months PSA/ DRE.\  Hollice Espy, MD  [facility] [address] [place], [place], [place] [address] [phone]

## 2018-02-25 ENCOUNTER — Other Ambulatory Visit: Payer: Medicare Other

## 2018-03-02 ENCOUNTER — Other Ambulatory Visit: Payer: Self-pay

## 2018-03-02 ENCOUNTER — Other Ambulatory Visit: Payer: Medicare Other

## 2018-03-02 DIAGNOSIS — C61 Malignant neoplasm of prostate: Secondary | ICD-10-CM

## 2018-03-03 ENCOUNTER — Telehealth: Payer: Self-pay | Admitting: Family Medicine

## 2018-03-03 LAB — PSA: PROSTATE SPECIFIC AG, SERUM: 14.6 ng/mL — AB (ref 0.0–4.0)

## 2018-03-03 NOTE — Telephone Encounter (Signed)
-----   Message from Hollice Espy, MD sent at 03/03/2018  8:51 AM EDT ----- PSA essentially stable (slightly higher but with the same range) as 3 months ago.  Lets keep watching closely.  See you in 3 months.    Hollice Espy, MD

## 2018-03-03 NOTE — Telephone Encounter (Signed)
LMOM to return call.

## 2018-03-04 ENCOUNTER — Encounter (HOSPITAL_COMMUNITY): Payer: Medicare Other

## 2018-03-04 ENCOUNTER — Ambulatory Visit: Payer: Medicare Other | Admitting: Urology

## 2018-03-04 ENCOUNTER — Ambulatory Visit: Payer: Medicare Other | Admitting: Family

## 2018-03-04 NOTE — Telephone Encounter (Signed)
Pt informed

## 2018-03-05 ENCOUNTER — Encounter: Payer: Self-pay | Admitting: Family

## 2018-03-05 ENCOUNTER — Ambulatory Visit (HOSPITAL_COMMUNITY)
Admission: RE | Admit: 2018-03-05 | Discharge: 2018-03-05 | Disposition: A | Discharge status: Home / Self Care | Payer: Medicare Other | Source: Ambulatory Visit | Attending: Surgery | Admitting: Surgery

## 2018-03-05 ENCOUNTER — Ambulatory Visit (INDEPENDENT_AMBULATORY_CARE_PROVIDER_SITE_OTHER): Payer: Medicare Other | Admitting: Family

## 2018-03-05 ENCOUNTER — Other Ambulatory Visit: Payer: Self-pay

## 2018-03-05 VITALS — BP 144/89 | HR 53 | Temp 97.6°F | Resp 16 | Ht 74.0 in | Wt 247.0 lb

## 2018-03-05 DIAGNOSIS — I7771 Dissection of carotid artery: Secondary | ICD-10-CM | POA: Diagnosis present

## 2018-03-05 DIAGNOSIS — I6523 Occlusion and stenosis of bilateral carotid arteries: Secondary | ICD-10-CM

## 2018-03-05 DIAGNOSIS — Z8679 Personal history of other diseases of the circulatory system: Secondary | ICD-10-CM | POA: Diagnosis not present

## 2018-03-05 NOTE — Progress Notes (Signed)
Chief Complaint: Follow up Extracranial Carotid Artery Stenosis   History of Present Illness  John [name] is a 77 y.o. male who was referred to Dr. Trula Slade by Dr. Lovie Macadamia for follow-up of a carotid dissection.   Dr. Trula Slade saw pt on initial evaluation on 02-26-17.   The patient began having symptoms of a drooping right eyelid about 2016 or 2017.   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  He was treated by vascular surgery in [place].  He was managed with 6 months of Plavix which was then transitioned to 81 mg of aspirin.  His symptoms have completely gone away.  He has not had any new events.  When he had his initial dissection, there were no associated issues of trauma or severe hypertension.  The patient does have a history of a epidural hematoma when he was in his childhood.  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  He has been diagnosed with a seizure disorder and is on Keppra he does suffer from hypertension which is well-controlled.  He is a nonsmoker.  At the 02-26-17 visit carotid duplex showed no evidence of dissection, and no significant stenosis Spontaneous carotid dissection: By ultrasound, the patient's carotid artery had healed.  He did not have any evidence of carotid stenosis.  He had been appropriately treated with Plavix, and was transitioned to 81 mg of aspirin on a daily basis.  He had no residual symptoms.  He has not had any recurrent symptoms.  Dr. Trula Slade suspected that,  given his history of subdural spontaneous hematoma and spontaneous carotid dissection, that he may have some underlying vasculitis and he discussed this with pt.  Dr. Trula Slade did not feel that pt needed to undergo testing for this as it would not change management strategy, and likely could be nondiagnostic. Dr. Trula Slade agreed with continuing a daily aspirin and annual carotid duplex surveillance.  Pt was to follow up again in 1 year with a repeat carotid ultrasound.   Pt denies claudication type sx's with walking. He is bradycardic on a beta blocker, he denies syncopal or presyncopal type symptoms, denies feeling light headed.   He denies any known history of stroke or TIA. Specifically he denies a history of amaurosis fugax or monocular blindness, unilateral facial drooping, hemiplegia, or receptive or expressive aphasia.    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   Diabetic: no Tobacco use: non-smoker  Pt meds include: Statin : no ASA: yes Other anticoagulants/antiplatelets: no   Past Medical History:  Diagnosis Date  . Cancer ([place])   . Depression   . Depression   . 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 Eczema   . Elevated PSA   . H/O Osgood-Schlatter disease   . Hand deformity, congenital   . Hearing loss bilateral  . Hemorrhoids   . Horner syndrome   . Hypertension   . Migraine   . Obesity   . Rosacea   . Sleep apnea     Social History Social History   Tobacco Use  . Smoking status: Never Smoker  . Smokeless tobacco: Never Used  Substance Use Topics  . Alcohol use: Yes    Alcohol/week: 0.0 oz    Comment: occ  . Drug use: No    Family History Family History  Problem Relation Age of Onset  . Osteoporosis Mother   .  Depression Mother   . Prostate cancer Father   . Hypertension Father   . Kidney disease Neg Hx     Surgical History Past Surgical History:  Procedure Laterality Date  . ANKLE ARTHROSCOPY    . CHOLECYSTECTOMY    . RETINAL DETACHMENT SURGERY    . SUBDURAL HEMATOMA EVACUATION VIA CRANIOTOMY    . THUMB FUSION    . TREATMENT FISTULA ANAL      No Known Allergies  Current Outpatient Medications  Medication Sig Dispense Refill  . aspirin EC 81 MG tablet Take by mouth.    Marland Kitchen atenolol (TENORMIN) 50 MG tablet Take 50 mg by mouth daily.  0  . finasteride (PROSCAR) 5 MG tablet Take 1 tablet (5 mg  total) daily by mouth. 30 tablet 11  . rizatriptan (MAXALT) 10 MG tablet     . venlafaxine (EFFEXOR) 37.5 MG tablet Take 37.5 mg by mouth daily.    Marland Kitchen venlafaxine XR (EFFEXOR-XR) 75 MG 24 hr capsule TAKE ONE CAPSULE BY MOUTH ONCE A DAY  0  . levETIRAcetam (KEPPRA) 500 MG tablet Take by mouth.     No current facility-administered medications for this visit.     Review of Systems : See HPI for pertinent positives and negatives.  Physical Examination  Vitals:   03/05/18 1354 03/05/18 1356  BP: 137/81 (!) 144/89  Pulse: (!) 53   Resp: 16   Temp: 97.6 F (36.4 C)   TempSrc: Oral   SpO2: 95%   Weight: 247 lb (112 kg)   Height: 6\' 2"  (1.88 m)    Body mass index is 31.71 kg/m.  General: WDWN obese male in NAD GAIT: normal Eyes: PERRLA HENT: No gross abnormalities.  Pulmonary:  Respirations are non-labored, good air movement, CTAB, no rales, rhonchi, or wheezing. Cardiac: regular rhythm with occasional missed contraction, bradycardic on a beta blocker, no detected murmur.  VASCULAR EXAM Carotid Bruits Right Left   Negative Negative     Abdominal aortic pulse is not palpable. Radial pulses are 2+ palpable and equal.                                                                                                                            LE Pulses Right Left       POPLITEAL  not palpable   not palpable       POSTERIOR TIBIAL  2+ palpable   2+ palpable        DORSALIS PEDIS      ANTERIOR TIBIAL 2+ palpable  2+ palpable     Gastrointestinal: soft, nontender, BS WNL, no r/g,  Small umbilical hernia, moderate sized asymtomatic ventral hernia. Musculoskeletal: No muscle atrophy/wasting. M/S 5/5 throughout, extremities without ischemic changes. Skin: No rashes, no ulcers, no cellulitis.   Neurologic:  A&O X 3; appropriate affect, sensation is normal; speech is normal, CN 2-12 intact, pain and light touch intact in extremities, motor exam as listed above. Psychiatric: Normal  thought  content, mood appropriate to clinical situation.    Assessment: John [name] is a 77 y.o. male who began having symptoms of a drooping right eyelid about 2016 or 2017.   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  He was treated by vascular surgery in [place].  He was managed with 6 months of Plavix which was then transitioned to 81 mg of aspirin.  His symptoms have completely gone away.  He has not had any new events.  When he had his initial dissection, there were no associated issues of trauma or severe hypertension.    DATA Carotid Duplex (03/05/18): Bilateral ICA with 1-39% stenosis. No mention of carotid artery dissection. Bilateral vertebral artery flow is antegrade.  Bilateral subclavian artery waveforms are normal.  No change from the exam on 02-26-17.    Plan: Follow-up in 2 years with Carotid Duplex scan.   I discussed in depth with the patient the nature of atherosclerosis, and emphasized the importance of maximal medical management including strict control of blood pressure, blood glucose, and lipid levels, obtaining regular exercise, and continued cessation of smoking.  The patient is aware that without maximal medical management the underlying atherosclerotic disease process will progress, limiting the benefit of any interventions. The patient was given information about stroke prevention and what symptoms should prompt the patient to seek immediate medical care. Thank you for allowing [place] to participate in this patient's care.  Clemon Chambers, RN, MSN, FNP-C [facility] Office: [phone]  Clinic Physician: Oneida Alar  03/05/18 2:14 PM

## 2018-03-05 NOTE — Patient Instructions (Signed)

## 2018-05-27 ENCOUNTER — Other Ambulatory Visit: Payer: Self-pay | Admitting: Family Medicine

## 2018-05-27 DIAGNOSIS — C61 Malignant neoplasm of prostate: Secondary | ICD-10-CM

## 2018-05-28 ENCOUNTER — Other Ambulatory Visit: Payer: Medicare Other

## 2018-05-28 DIAGNOSIS — C61 Malignant neoplasm of prostate: Secondary | ICD-10-CM

## 2018-05-29 LAB — PSA: PROSTATE SPECIFIC AG, SERUM: 16.8 ng/mL — AB (ref 0.0–4.0)

## 2018-06-02 ENCOUNTER — Encounter: Payer: Self-pay | Admitting: Urology

## 2018-06-02 ENCOUNTER — Ambulatory Visit (INDEPENDENT_AMBULATORY_CARE_PROVIDER_SITE_OTHER): Payer: Medicare Other | Admitting: Urology

## 2018-06-02 VITALS — BP 165/73 | HR 60 | Ht 74.0 in | Wt 245.0 lb

## 2018-06-02 DIAGNOSIS — I6523 Occlusion and stenosis of bilateral carotid arteries: Secondary | ICD-10-CM | POA: Diagnosis not present

## 2018-06-02 DIAGNOSIS — C61 Malignant neoplasm of prostate: Secondary | ICD-10-CM | POA: Diagnosis not present

## 2018-06-02 DIAGNOSIS — R972 Elevated prostate specific antigen [PSA]: Secondary | ICD-10-CM | POA: Diagnosis not present

## 2018-06-02 NOTE — Progress Notes (Signed)
06/02/2018 12:22 PM   [name] 24-Jul-1941 [id number]  Referring provider: Juluis Pitch, MD [phone] [address] [place], [place] [address]  Chief Complaint  Patient presents with  . 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   Background history He was initially referred for further workup of an elevated PSA to 8.8, normal DRE s/p prostate biopsy on 05/12/15 demonstrating Gleason 3+3 involving 1 of 12 cores, 32% involvement. TRUS volume 68 cc with slight intravesical protrusion of the median lobe.   Repeat biopsy on 04/11/16 showed 1 core Gleason 3+3, 5% with evidence of chronic inflammation.    His PSA continued to trend upwards.  PSA trend as below.  Most recent PSA 17.7 on 02/2017.  Given concern for possible progression of disease, he underwent a prostate MRI.  This showed a lateral apical signal abnormality, PI-RADS 2/3 as well as a more equivocal small volume signal within the medial left apex, PI-RADS 2.  He underwent MRI fusion biopsy performed at [facility] by Dr. Tresa Moore on 05/2017 which showed 3 cores of 3+3 bilaterally, up to 20 % of tissue.           He was started on finasteride following his visit in 08/2017.  This is primarily for diagnostic purposes.  Since starting this medication, his PSA has come down from 21.13 08/2017 now to 16.8 which is up from 14.6 a few months ago.                                                                                                                                                              He remains asymptomatic in terms of urinary symptoms.  He is on finasteride primarily for chemoprevention/diagnostic reasons and not underlying urinary issues.  No gross hematuriafrequency/urgency.  No weight loss or bone pain.  He does report today that he has noticed a change in his ejaculatory function.   The volume has decreased significantly and is more watery.  No hematospermia.  This is new for him.  He is not on any alpha blockers.  His father was diagnosed with PCa when he was in his late 79s but died in his 44s of other causes.    PMH: Past Medical History:  Diagnosis Date  . Cancer ([place])   . Depression   . Depression   . 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 Eczema   . Elevated PSA   . H/O Osgood-Schlatter disease   . Hand deformity, congenital   . Hearing loss bilateral  . Hemorrhoids   . Horner syndrome   . Hypertension   . Migraine   . Obesity   .  Rosacea   . Sleep apnea     Surgical History: Past Surgical History:  Procedure Laterality Date  . ANKLE ARTHROSCOPY    . CHOLECYSTECTOMY    . RETINAL DETACHMENT SURGERY    . SUBDURAL HEMATOMA EVACUATION VIA CRANIOTOMY    . THUMB FUSION    . TREATMENT FISTULA ANAL      Home Medications:  Allergies as of 06/02/2018   No Known Allergies     Medication List        Accurate as of 06/02/18 12:22 PM. Always use your most recent med list.          aspirin EC 81 MG tablet Take by mouth.   atenolol 50 MG tablet Commonly known as:  TENORMIN Take 50 mg by mouth daily.   finasteride 5 MG tablet Commonly known as:  PROSCAR Take 1 tablet (5 mg total) daily by mouth.   levETIRAcetam 500 MG tablet Commonly known as:  KEPPRA Take by mouth.   rizatriptan 10 MG tablet Commonly known as:  MAXALT   venlafaxine 37.5 MG tablet Commonly known as:  EFFEXOR Take 37.5 mg by mouth daily.   venlafaxine XR 75 MG 24 hr capsule Commonly known as:  EFFEXOR-XR TAKE ONE CAPSULE BY MOUTH ONCE A DAY       Allergies: No Known Allergies  Family History: Family History  Problem Relation Age of Onset  . Osteoporosis Mother   . Depression Mother   . Prostate cancer Father   . Hypertension Father   . Kidney disease Neg Hx     Social History:  reports that he has never smoked. He has never used smokeless  tobacco. He reports that he drinks alcohol. He reports that he does not use drugs.  ROS: UROLOGY Frequent Urination?: No Hard to postpone urination?: No Burning/pain with urination?: No Get up at night to urinate?: No Leakage of urine?: No Urine stream starts and stops?: No Trouble starting stream?: No Do you have to strain to urinate?: No Blood in urine?: No Urinary tract infection?: No Sexually transmitted disease?: No Injury to kidneys or bladder?: No Painful intercourse?: No Weak stream?: No Erection problems?: No Penile pain?: No  Gastrointestinal Nausea?: No Vomiting?: No Indigestion/heartburn?: No Diarrhea?: No Constipation?: No  Constitutional Fever: No Night sweats?: No Weight loss?: No Fatigue?: No  Skin Skin rash/lesions?: No Itching?: No  Eyes Blurred vision?: No Double vision?: No  Ears/Nose/Throat Sore throat?: No Sinus problems?: No  Hematologic/Lymphatic Swollen glands?: No Easy bruising?: No  Cardiovascular Leg swelling?: No Chest pain?: No  Respiratory Cough?: No Shortness of breath?: No  Endocrine Excessive thirst?: No  Musculoskeletal Back pain?: No Joint pain?: No  Neurological Headaches?: No Dizziness?: No  Psychologic Depression?: No Anxiety?: No  Physical Exam: BP (!) 165/73   Pulse 60   Ht 6\' 2"  (1.88 m)   Wt 245 lb (111.1 kg)   BMI 31.46 kg/m   Constitutional:  Alert and oriented, No acute distress. HEENT: [place] AT, moist mucus membranes.  Trachea midline, no masses. Cardiovascular: No clubbing, cyanosis, or edema. Respiratory: Normal respiratory effort, no increased work of breathing.. Skin: No rashes, bruises or suspicious lesions. Neurologic: Grossly intact, no focal deficits, moving all 4 extremities. Psychiatric: Normal mood and affect.  Laboratory Data:  Lab Results  Component Value Date   CREATININE 0.80 05/21/2017   PSA history             8.8 ng/mL on 05/18/2015, biopsy at that time noted  low risk  Gleason 3+3 prostate cancer             9.3 ng/mL on 10/06/2015             10.2 ng/mL on 01/26/2016              14.6 ng/mL on 08/19/16             16.8 ng/mL on 12/23/16             17.2 ng/mL on 03/26/17              21.1 ng/mL on 08/2017  --> finasteride started              13.8 on 11/2016              14.6 on 02/3018             16.8 on 05/2018   Urinalysis N/a  Pertinent Imaging: No new interval imaging  Assessment & Plan:    1. Prostate cancer [facility]) First diagnosed with low risk Gleason 3+3 prostate cancer, cT1cNxMx, PSA 8.8, TRUS 68cc in 07/216.    Repeat biopsy 3+3 on 03/2016 with stable disease/ chronic inflammation and most recently MRI fusion biopsy which is essentially stable.    Initially PSA came down nicely with the initiation of finasteride but his stents continue to climb steadily which is concerning  The overall trend for his PSA has been steadily up concerning for underlying high risk prostate cancer  At this point, I have asked him to strongly consider referral to radiation oncology to discuss treatment in light of concern for more significant underlying disease.  He is willing to go talk to Dr. Baruch Gouty and may or may not be interested in pursuing this at this time.  In addition, we discussed repeating the prostate MRI to assess for any change or possible new lesions.  He is open to this idea and will likely help him with his decision-making as he moves forward.  We again reviewed the natural history of prostate cancer as well as indications for surveillance versus treatment.  - Ambulatory referral to Radiation Oncology - MR PROSTATE W WO CONTRAST; Future  2. Rising PSA level As above, highly concerning for more significant underlying disease   Return for will call with MRI results.  Hollice Espy, MD  [facility] [address] [place], [place] [address] [phone]

## 2018-06-05 ENCOUNTER — Other Ambulatory Visit: Payer: Self-pay | Admitting: *Deleted

## 2018-06-10 ENCOUNTER — Ambulatory Visit: Payer: Medicare Other | Admitting: Radiation Oncology

## 2018-06-11 ENCOUNTER — Other Ambulatory Visit: Payer: Self-pay | Admitting: *Deleted

## 2018-06-11 ENCOUNTER — Other Ambulatory Visit: Payer: Self-pay

## 2018-06-11 ENCOUNTER — Telehealth: Payer: Self-pay | Admitting: *Deleted

## 2018-06-11 ENCOUNTER — Encounter: Payer: Self-pay | Admitting: Radiation Oncology

## 2018-06-11 ENCOUNTER — Ambulatory Visit
Admission: RE | Admit: 2018-06-11 | Discharge: 2018-06-11 | Disposition: A | Discharge status: Home / Self Care | Payer: Medicare Other | Source: Ambulatory Visit | Attending: Radiation Oncology | Admitting: Radiation Oncology

## 2018-06-11 DIAGNOSIS — C61 Malignant neoplasm of prostate: Secondary | ICD-10-CM

## 2018-06-11 NOTE — Consult Note (Signed)
NEW PATIENT EVALUATION  Name: John [name]  MRN: [id number]  Date:   06/11/2018     DOB: 10-Feb-1941   This 77 y.o. male patient presents to the clinic for initial evaluation of a stage IIa (T1 cN0 M0) Gleason 6 (3+3) presenting with a PSA of.21  REFERRING PHYSICIAN: Juluis Pitch, MD  CHIEF COMPLAINT:  Chief Complaint  Patient presents with  . Prostate Cancer    Initial consultation of prostate cancer.     DIAGNOSIS: There were no encounter diagnoses.   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 His prostate volume was 68 cc.He underwent watchful waiting although his PSA has continued to climb most recent PSA 518 was 17.7. He underwent a prostate MRI scan showing lateral apical signal abnormalitywhich guided repeat biopsy showing 3 of 3 cores positive forGleason 6 (3+3). He is been started on finasteride. His PSA did drop to 14.6.Based on his high PSA he is now referred to radiation oncology for opinion. He actually is doing well is asymptomatic. Specifically denies nocturia and urgency or frequency bone pain.  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    PAST SURGICAL HISTORY:  Past Surgical History:  Procedure Laterality Date  . ANKLE ARTHROSCOPY    . CHOLECYSTECTOMY    . RETINAL DETACHMENT SURGERY    . SUBDURAL HEMATOMA EVACUATION VIA CRANIOTOMY    . THUMB FUSION    . TREATMENT  FISTULA ANAL      FAMILY HISTORY: family history includes Depression in his mother; Hypertension in his father; Osteoporosis in his mother; Prostate cancer in his father.  SOCIAL HISTORY:  reports that he has never smoked. He has never used smokeless tobacco. He reports that he drinks alcohol. He reports that he does not use drugs.  ALLERGIES: Patient has no known allergies.  MEDICATIONS:  Current Outpatient Medications  Medication Sig Dispense Refill  . aspirin EC 81 MG tablet Take by mouth.    Marland Kitchen atenolol (TENORMIN) 50 MG tablet Take 50 mg by mouth daily.  0  . finasteride (PROSCAR) 5 MG tablet Take 1 tablet (5 mg total) daily by mouth. 30 tablet 11  . levETIRAcetam (KEPPRA) 500 MG tablet Take by mouth.    . rizatriptan (MAXALT) 10 MG tablet     . venlafaxine (EFFEXOR) 37.5 MG tablet Take 37.5 mg by mouth daily.    Marland Kitchen venlafaxine XR (EFFEXOR-XR) 75 MG 24 hr capsule TAKE ONE CAPSULE BY MOUTH ONCE A DAY  0   No current facility-administered medications for this encounter.     ECOG PERFORMANCE STATUS:  0 - Asymptomatic  REVIEW OF SYSTEMS:  Patient denies any weight loss, fatigue, weakness, fever, chills or night sweats. Patient denies any loss of vision, blurred vision. Patient denies any ringing  of the ears or hearing loss. No irregular heartbeat. Patient denies heart murmur or history of fainting. Patient denies any chest pain or pain radiating to her upper extremities. Patient denies any shortness of breath, difficulty breathing at night, cough or hemoptysis. Patient denies any swelling  in the lower legs. Patient denies any nausea vomiting, vomiting of blood, or coffee ground material in the vomitus. Patient denies any stomach pain. Patient states has had normal bowel movements no significant constipation or diarrhea. Patient denies any dysuria, hematuria or significant nocturia. Patient denies any problems walking, swelling in the joints or loss of balance. Patient denies any skin  changes, loss of hair or loss of weight. Patient denies any excessive worrying or anxiety or significant depression. Patient denies any problems with insomnia. Patient denies excessive thirst, polyuria, polydipsia. Patient denies any swollen glands, patient denies easy bruising or easy bleeding. Patient denies any recent infections, allergies or URI. Patient "s visual fields have not changed significantly in recent time.    PHYSICAL EXAM: BP (P) 125/74 (BP Location: Left Arm)   Pulse (!) (P) 46   Temp (!) (P) 96.1 F (35.6 C) (Tympanic)   Resp (P) 18   Wt (P) 242 lb 1 oz (109.8 kg)   BMI (P) 31.08 kg/m  On rectal exam there is slight asymmetry of the prostate with the left lateral lobe somewhat more prominent although no discrete nodularity is identified sulcus is preserved bilaterally. Remainder of rectal exam is unremarkable.Well-developed well-nourished patient in NAD. HEENT reveals PERLA, EOMI, discs not visualized.  Oral cavity is clear. No oral mucosal lesions are identified. Neck is clear without evidence of cervical or supraclavicular adenopathy. Lungs are clear to A&P. Cardiac examination is essentially unremarkable with regular rate and rhythm without murmur rub or thrill. Abdomen is benign with no organomegaly or masses noted. Motor sensory and DTR levels are equal and symmetric in the upper and lower extremities. Cranial nerves II through XII are grossly intact. Proprioception is intact. No peripheral adenopathy or edema is identified. No motor or sensory levels are noted. Crude visual fields are within normal range.  LABORATORY DATA: pathology reports reviewed    RADIOLOGY RESULTS:MRI scans reviewed   IMPRESSION: at this time based on his PSA over 10 I believe a baseline bone scan would be indicated and have ordered that. I will discuss his findings although I doubt we will find any evidence to suggest metastatic disease. Based on the large size of his prostate I-125 interstitial  implant is not indicated. I would plan to image guided I MRT radiation therapy to 8000 cGy over 8 weeks. Risks and benefits of treatment including increased lower urinary tract symptoms diarrhea fatigue alteration of blood counts and possibleincreaseft erectile dysfunction all were discussed in detail with the patient. I've ordered his bone scan a follow-up appointment shortly thereafter. I will then asked Dr. Erlene Quan to place fiduciary markers in his prostate for image guided therapy. Patient comprehend my treatment plan well.  I would like to take this opportunity to thank you for allowing me to participate in the care of your patient.Marland Kitchen  PLAN: as above  Noreene Filbert, MD

## 2018-06-11 NOTE — Telephone Encounter (Signed)
John [name] was a No Show for his consultation.  Called patient to see if he would like to reschedule.  Voicemail message left asking for a return call.

## 2018-06-16 ENCOUNTER — Encounter
Admission: RE | Admit: 2018-06-16 | Discharge: 2018-06-16 | Disposition: A | Discharge status: Home / Self Care | Payer: Medicare Other | Source: Ambulatory Visit | Attending: Radiation Oncology | Admitting: Radiation Oncology

## 2018-06-16 DIAGNOSIS — C61 Malignant neoplasm of prostate: Secondary | ICD-10-CM | POA: Insufficient documentation

## 2018-06-16 MED ORDER — TECHNETIUM TC 99M MEDRONATE IV KIT
23.9360 | PACK | Freq: Once | INTRAVENOUS | Status: AC | PRN
  Administered 2018-06-16: 23.936 via INTRAVENOUS

## 2018-06-18 ENCOUNTER — Ambulatory Visit
Admission: RE | Admit: 2018-06-18 | Discharge: 2018-06-18 | Disposition: A | Discharge status: Home / Self Care | Payer: Medicare Other | Source: Ambulatory Visit | Attending: Radiation Oncology | Admitting: Radiation Oncology

## 2018-06-18 ENCOUNTER — Encounter: Payer: Self-pay | Admitting: Radiation Oncology

## 2018-06-18 ENCOUNTER — Other Ambulatory Visit: Payer: Self-pay

## 2018-06-18 DIAGNOSIS — C61 Malignant neoplasm of prostate: Secondary | ICD-10-CM | POA: Insufficient documentation

## 2018-06-18 NOTE — Progress Notes (Signed)
Radiation Oncology Follow up Note  Name: John [name]   Date:   06/18/2018 MRN:  [id number] DOB: January 10, 1941    This 77 y.o. male presents to the clinic today fordiscussresults of bone scan.  REFERRING PROVIDER: Juluis Pitch, MD  HPI: patient is a 77 year old male who been under watchful waiting for a Gleason 6 adenocarcinoma the prostate presenting with a PSA of 8.8. PSA climbed to 17.7 MRI scan showed lateral apical signal abnormality and biopsy at this time was positive in 3 of 3 cores for Gleason 6 adenocarcinoma. He is been started on finasteride. I ordered a bone scan would patient which was negative for metastatic disease. We discussed that today and he is now ready to begin external beam radiation therapy image guided..  COMPLICATIONS OF TREATMENT: none  FOLLOW UP COMPLIANCE: keeps appointments   PHYSICAL EXAM:  BP (P) 135/81 (BP Location: Left Arm, Patient Position: Sitting)   Pulse (!) (P) 52   Resp (P) 18   Wt (P) 241 lb 1.2 oz (109.4 kg)   BMI (P) 30.95 kg/m  Well-developed well-nourished patient in NAD. HEENT reveals PERLA, EOMI, discs not visualized.  Oral cavity is clear. No oral mucosal lesions are identified. Neck is clear without evidence of cervical or supraclavicular adenopathy. Lungs are clear to A&P. Cardiac examination is essentially unremarkable with regular rate and rhythm without murmur rub or thrill. Abdomen is benign with no organomegaly or masses noted. Motor sensory and DTR levels are equal and symmetric in the upper and lower extremities. Cranial nerves II through XII are grossly intact. Proprioception is intact. No peripheral adenopathy or edema is identified. No motor or sensory levels are noted. Crude visual fields are within normal range.  RADIOLOGY RESULTS: bone scan reviewed and discussed with patient  PLAN: this time I continue to recommend based on the size of his prostate which would make interstitial implant contraindicated to go ahead with 8000  cGy external beam treatment using image guided I MRT treatment planning and delivery. I have asked Dr. Erlene Quan to place fiduciary markers. Patient has an appointment next week with Dr. Erlene Quan will discuss this. He will contact us when the markers are to be placed.  I would like to take this opportunity to thank you for allowing me to participate in the care of your patient.Noreene Filbert, MD

## 2018-07-03 ENCOUNTER — Encounter: Payer: Self-pay | Admitting: Urology

## 2018-07-03 ENCOUNTER — Ambulatory Visit (INDEPENDENT_AMBULATORY_CARE_PROVIDER_SITE_OTHER): Payer: Medicare Other | Admitting: Urology

## 2018-07-03 VITALS — BP 132/76 | HR 60 | Ht 74.0 in | Wt 240.2 lb

## 2018-07-03 DIAGNOSIS — I6523 Occlusion and stenosis of bilateral carotid arteries: Secondary | ICD-10-CM

## 2018-07-03 DIAGNOSIS — C61 Malignant neoplasm of prostate: Secondary | ICD-10-CM

## 2018-07-03 DIAGNOSIS — R972 Elevated prostate specific antigen [PSA]: Secondary | ICD-10-CM

## 2018-07-03 NOTE — Patient Instructions (Signed)

## 2018-07-03 NOTE — Progress Notes (Signed)
07/03/2018 3:24 PM   [name] [birth year] [id number]  Referring provider: Juluis Pitch, MD [phone] [address] [place], [place] [address]  Chief Complaint  Patient presents with  . Prostate Cancer    HPI: 77 year old male with a history of prostate cancer, initially diagnosed with Gleason 3+3 with a rising PSA most recently up to 17.7 he returns today with questions about radiation as well as placement of gold seed markers.  Please see previous notes for his prostate cancer history.  In the interim, he did undergo bone scan ordered by Dr. Baruch Gouty which was negative for any metastatic disease to the bone.  He overall is feeling well.  He has no significant voiding complaints.  He is currently on finasteride for history of BPH.  He wonders if he can stop this after radiation.  He does have some questions today specifically about radiation itself.  He wants to revisit his choices and make sure he is making a good decision.   PMH: Past Medical History:  Diagnosis Date  . Cancer ([place])   . Depression   . Depression   . 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 Eczema   . Elevated PSA   . H/O Osgood-Schlatter disease   . Hand deformity, congenital   . Hearing loss bilateral  . Hemorrhoids   . Horner syndrome   . Hypertension   . Migraine   . Obesity   . Rosacea   . Sleep apnea     Surgical History: Past Surgical History:  Procedure Laterality Date  . ANKLE ARTHROSCOPY    . CHOLECYSTECTOMY    . RETINAL DETACHMENT SURGERY    . SUBDURAL HEMATOMA EVACUATION VIA CRANIOTOMY    . THUMB FUSION    . TREATMENT FISTULA ANAL      Home Medications:  Allergies as of 07/03/2018   No Known Allergies     Medication List        Accurate as of 07/03/18 11:59 PM. Always use your most recent med list.          aspirin EC 81 MG tablet Take by mouth.   atenolol 50 MG tablet Commonly known as:  TENORMIN Take 50 mg by mouth daily.   finasteride 5 MG tablet Commonly  known as:  PROSCAR Take 1 tablet (5 mg total) daily by mouth.   levETIRAcetam 500 MG tablet Commonly known as:  KEPPRA Take by mouth.   rizatriptan 10 MG tablet Commonly known as:  MAXALT   venlafaxine 37.5 MG tablet Commonly known as:  EFFEXOR Take 37.5 mg by mouth daily.   venlafaxine XR 75 MG 24 hr capsule Commonly known as:  EFFEXOR-XR TAKE ONE CAPSULE BY MOUTH ONCE A DAY       Allergies: No Known Allergies  Family History: Family History  Problem Relation Age of Onset  . Osteoporosis Mother   . Depression Mother   . Prostate cancer Father   . Hypertension Father   . Kidney disease Neg Hx     Social History:  reports that he has never smoked. He has never used smokeless tobacco. He reports that he drinks alcohol. He reports that he does not use drugs.  ROS: UROLOGY Frequent Urination?: No Hard to postpone urination?: No Burning/pain with urination?: No Get up at night to urinate?: No Leakage of urine?: No Urine stream starts and stops?: No Trouble starting stream?: No Do you have to strain to urinate?: No Blood in urine?: No Urinary tract infection?: No Sexually  transmitted disease?: No Injury to kidneys or bladder?: No Painful intercourse?: No Weak stream?: No Erection problems?: No Penile pain?: No  Gastrointestinal Nausea?: No Vomiting?: No Indigestion/heartburn?: No Diarrhea?: No Constipation?: No  Constitutional Fever: No Night sweats?: No Weight loss?: No Fatigue?: No  Skin Skin rash/lesions?: No Itching?: No  Eyes Blurred vision?: No Double vision?: No  Ears/Nose/Throat Sore throat?: No Sinus problems?: No  Hematologic/Lymphatic Swollen glands?: No Easy bruising?: No  Cardiovascular Leg swelling?: No Chest pain?: No  Respiratory Cough?: No Shortness of breath?: No  Endocrine Excessive thirst?: No  Musculoskeletal Back pain?: No Joint pain?: No  Neurological Headaches?: No Dizziness?:  No  Psychologic Depression?: No Anxiety?: No  Physical Exam: BP 132/76 (BP Location: Left Arm, Patient Position: Sitting, Cuff Size: Large)   Pulse 60   Ht 6\' 2"  (1.88 m)   Wt 240 lb 3.2 oz (109 kg)   BMI 30.84 kg/m   Constitutional:  Alert and oriented, No acute distress. HEENT:  AT, moist mucus membranes.  Trachea midline, no masses. Cardiovascular: No clubbing, cyanosis, or edema. Respiratory: Normal respiratory effort, no increased work of breathing. Lymph: No cervical or inguinal lymphadenopathy. Skin: No rashes, bruises or suspicious lesions. Neurologic: Grossly intact, no focal deficits, moving all 4 extremities. Psychiatric: Normal mood and affect.  Laboratory Data:  Lab Results  Component Value Date   CREATININE 0.80 05/21/2017   Component     Latest Ref Rng & Units 10/06/2015 01/26/2016 08/19/2016 12/23/2016  Prostate Specific Ag, Serum     0.0 - 4.0 ng/mL 9.3 (H) 10.2 (H) 14.6 (H) 16.8 (H)   Component     Latest Ref Rng & Units 03/26/2017 09/02/2017 11/28/2017 03/02/2018  Prostate Specific Ag, Serum     0.0 - 4.0 ng/mL 17.7 (H) 21.1 (H) 13.8 (H) 14.6 (H)   Component     Latest Ref Rng & Units 05/28/2018  Prostate Specific Ag, Serum     0.0 - 4.0 ng/mL 16.8 (H)    Urinalysis    Component Value Date/Time   APPEARANCEUR Clear 10/06/2015 0839   GLUCOSEU Negative 10/06/2015 0839   BILIRUBINUR Negative 10/06/2015 0839   PROTEINUR Negative 10/06/2015 0839   NITRITE Negative 10/06/2015 0839   LEUKOCYTESUR Negative 10/06/2015 0839    Lab Results  Component Value Date   LABMICR See below: 10/06/2015   WBCUA 0-5 10/06/2015   RBCUA None seen 10/06/2015   LABEPIT 0-10 10/06/2015   MUCUS Present (A) 10/06/2015   BACTERIA Moderate (A) 10/06/2015    Pertinent Imaging: Prostate MRI from 05/21/2017 personally reviewed  Assessment & Plan:    1. Prostate cancer [facility]) Initially diagnosed with Gleason 3+3 disease, PSA continues to rise slowly despite his  biopsy-proven low risk disease Is concerning for higher risk disease He has some questions today about external beam radiation but ultimately is confident in decision to proceed with this He will be scheduled for gold seed marker placement We did discuss the role of ADT with IMRT, 6 months recommended for intermediate and 2 to 3 years for high risk disease per NCCN guidelines Unfortunately, we do not have biopsy-proven evidence of either of these I would advocate for 6 months for presumed intermediate risk disease  Did discuss Lupron at length today including risk of hot flashes, loss of muscle mass, central obesity, loss of bone density and long-term risk of cardiac issues.  All questions answered.  He was given a handout on this medication.  He will let us know if he would  like to receive this and will schedule for Lupron at the time of difficulty placement if he would like to pursue ADT as adjuvant treatment.  All questions answered today.  2. Rising PSA level Concerning for disease progression, slowly upward trending despite initiation of finasteride   Return in about 4 weeks (around 07/31/2018) for gold seed placement, lupron.  Hollice Espy, MD  [facility] [address], [place], [place] [address] [phone]  I spent 15 min with this patient of which greater than 50% was spent in counseling and coordination of care with the patient.

## 2018-07-29 ENCOUNTER — Other Ambulatory Visit: Payer: Medicare Other | Admitting: Urology

## 2018-09-01 ENCOUNTER — Ambulatory Visit (INDEPENDENT_AMBULATORY_CARE_PROVIDER_SITE_OTHER): Payer: Medicare Other | Admitting: Family Medicine

## 2018-09-01 DIAGNOSIS — C61 Malignant neoplasm of prostate: Secondary | ICD-10-CM | POA: Diagnosis not present

## 2018-09-01 MED ORDER — LEUPROLIDE ACETATE (6 MONTH) 45 MG IM KIT
45.0000 mg | PACK | Freq: Once | INTRAMUSCULAR | Status: AC
  Administered 2018-09-01: 45 mg via INTRAMUSCULAR

## 2018-09-01 NOTE — Progress Notes (Signed)
Lupron IM Injection   Due to Prostate Cancer patient is present today for a Lupron Injection.  Medication: Lupron 6 month Dose: 45 mg  Location: right upper outer buttocks Lot: [id number] Exp: 11/15/2020  Patient tolerated well, no complications were noted  Performed by: Elberta Leatherwood, CMA

## 2018-09-07 ENCOUNTER — Ambulatory Visit: Payer: Medicare Other

## 2018-09-08 ENCOUNTER — Telehealth: Payer: Self-pay | Admitting: Urology

## 2018-09-08 NOTE — Telephone Encounter (Signed)
It looks like this patient never got his prostate MRI but he is scheduled for gold seed on 09-29-18  Does he still need the MRI?   Sharyn Lull

## 2018-09-09 NOTE — Telephone Encounter (Signed)
No does not need this study.  He has elected pursue treatment.  Hollice Espy, MD

## 2018-09-29 ENCOUNTER — Ambulatory Visit (INDEPENDENT_AMBULATORY_CARE_PROVIDER_SITE_OTHER): Payer: Medicare Other | Admitting: Urology

## 2018-09-29 ENCOUNTER — Encounter: Payer: Self-pay | Admitting: Urology

## 2018-09-29 VITALS — BP 160/92 | HR 53 | Ht 74.0 in | Wt 240.0 lb

## 2018-09-29 DIAGNOSIS — C61 Malignant neoplasm of prostate: Secondary | ICD-10-CM | POA: Diagnosis not present

## 2018-09-29 MED ORDER — LEVOFLOXACIN 500 MG PO TABS: 500.0000 mg | ORAL_TABLET | Freq: Once | ORAL | Status: DC

## 2018-09-29 MED ORDER — GENTAMICIN SULFATE 40 MG/ML IJ SOLN
80.0000 mg | Freq: Once | INTRAMUSCULAR | Status: DC
Start: 2018-09-29 — End: 2022-04-01

## 2018-09-29 NOTE — Patient Instructions (Signed)
Discussed importance of bone health on ADT, recommend 1000-1200 mg daily calcium suppliment and 800-1000 IU vit D daily.  Also encouraged weight being exercises and cardiovascular health.  

## 2018-09-29 NOTE — Progress Notes (Signed)
09/29/18  CC: gold seed markers  HPI: 77 y.o. male with prostate cancer who presents today for placement of fiducial seed markers in anticipation of his upcoming IMRT with Dr. Baruch Gouty.  He has a personal history of prostate cancer, low risk however his PSA has been trending upwards.  He is ultimately decided deferred treatment.  He received Lupron last month and has been tolerating it well.  Prostate Gold Seed Marker Placement Procedure   Informed consent was obtained after discussing risks/benefits of the procedure.  A time out was performed to ensure correct patient identity.  Pre-Procedure: - Gentamicin given prophylactically - PO Levaquin 500 mg also given today  Procedure: -Lidocaine jelly was administered per rectum -Rectal ultrasound probe was placed without difficulty and the prostate visualized - 3 fiducial gold seed markers placed, one at right base, one at left base, one at apex of prostate gland under transrectal ultrasound guidance  Post-Procedure: - Patient tolerated the procedure well - He was counseled to seek immediate medical attention if experiences any severe pain, significant bleeding, or fevers  Return for MD appointment at time of next lupron 02/2019.

## 2018-10-01 ENCOUNTER — Ambulatory Visit
Admission: RE | Admit: 2018-10-01 | Discharge: 2018-10-01 | Disposition: A | Discharge status: Home / Self Care | Payer: Medicare Other | Source: Ambulatory Visit | Attending: Radiation Oncology | Admitting: Radiation Oncology

## 2018-10-01 DIAGNOSIS — C61 Malignant neoplasm of prostate: Secondary | ICD-10-CM | POA: Diagnosis not present

## 2018-10-01 DIAGNOSIS — Z510 Encounter for antineoplastic radiation therapy: Secondary | ICD-10-CM | POA: Insufficient documentation

## 2018-10-02 DIAGNOSIS — Z510 Encounter for antineoplastic radiation therapy: Secondary | ICD-10-CM | POA: Diagnosis not present

## 2018-10-09 ENCOUNTER — Other Ambulatory Visit: Payer: Self-pay | Admitting: *Deleted

## 2018-10-09 DIAGNOSIS — C61 Malignant neoplasm of prostate: Secondary | ICD-10-CM

## 2018-10-12 ENCOUNTER — Ambulatory Visit
Admission: RE | Admit: 2018-10-12 | Discharge: 2018-10-12 | Disposition: A | Discharge status: Home / Self Care | Payer: Medicare Other | Source: Ambulatory Visit | Attending: Radiation Oncology | Admitting: Radiation Oncology

## 2018-10-13 ENCOUNTER — Ambulatory Visit
Admission: RE | Admit: 2018-10-13 | Discharge: 2018-10-13 | Disposition: A | Discharge status: Home / Self Care | Payer: Medicare Other | Source: Ambulatory Visit | Attending: Radiation Oncology | Admitting: Radiation Oncology

## 2018-10-13 DIAGNOSIS — Z510 Encounter for antineoplastic radiation therapy: Secondary | ICD-10-CM | POA: Diagnosis not present

## 2018-10-14 ENCOUNTER — Ambulatory Visit
Admission: RE | Admit: 2018-10-14 | Discharge: 2018-10-14 | Disposition: A | Discharge status: Home / Self Care | Payer: Medicare Other | Source: Ambulatory Visit | Attending: Radiation Oncology | Admitting: Radiation Oncology

## 2018-10-14 DIAGNOSIS — Z510 Encounter for antineoplastic radiation therapy: Secondary | ICD-10-CM | POA: Diagnosis not present

## 2018-10-15 ENCOUNTER — Ambulatory Visit
Admission: RE | Admit: 2018-10-15 | Discharge: 2018-10-15 | Disposition: A | Discharge status: Home / Self Care | Payer: Medicare Other | Source: Ambulatory Visit | Attending: Radiation Oncology | Admitting: Radiation Oncology

## 2018-10-15 DIAGNOSIS — Z510 Encounter for antineoplastic radiation therapy: Secondary | ICD-10-CM | POA: Diagnosis not present

## 2018-10-16 ENCOUNTER — Ambulatory Visit
Admission: RE | Admit: 2018-10-16 | Discharge: 2018-10-16 | Disposition: A | Discharge status: Home / Self Care | Payer: Medicare Other | Source: Ambulatory Visit | Attending: Radiation Oncology | Admitting: Radiation Oncology

## 2018-10-16 DIAGNOSIS — Z510 Encounter for antineoplastic radiation therapy: Secondary | ICD-10-CM | POA: Diagnosis not present

## 2018-10-19 ENCOUNTER — Ambulatory Visit
Admission: RE | Admit: 2018-10-19 | Discharge: 2018-10-19 | Disposition: A | Discharge status: Home / Self Care | Payer: Medicare Other | Source: Ambulatory Visit | Attending: Radiation Oncology | Admitting: Radiation Oncology

## 2018-10-19 DIAGNOSIS — Z510 Encounter for antineoplastic radiation therapy: Secondary | ICD-10-CM | POA: Diagnosis not present

## 2018-10-20 ENCOUNTER — Ambulatory Visit
Admission: RE | Admit: 2018-10-20 | Discharge: 2018-10-20 | Disposition: A | Discharge status: Home / Self Care | Payer: Medicare Other | Source: Ambulatory Visit | Attending: Radiation Oncology | Admitting: Radiation Oncology

## 2018-10-20 DIAGNOSIS — Z510 Encounter for antineoplastic radiation therapy: Secondary | ICD-10-CM | POA: Diagnosis not present

## 2018-10-22 ENCOUNTER — Ambulatory Visit
Admission: RE | Admit: 2018-10-22 | Discharge: 2018-10-22 | Disposition: A | Discharge status: Home / Self Care | Payer: Medicare Other | Source: Ambulatory Visit | Attending: Radiation Oncology | Admitting: Radiation Oncology

## 2018-10-22 DIAGNOSIS — Z510 Encounter for antineoplastic radiation therapy: Secondary | ICD-10-CM | POA: Diagnosis not present

## 2018-10-23 ENCOUNTER — Ambulatory Visit
Admission: RE | Admit: 2018-10-23 | Discharge: 2018-10-23 | Disposition: A | Discharge status: Home / Self Care | Payer: Medicare Other | Source: Ambulatory Visit | Attending: Radiation Oncology | Admitting: Radiation Oncology

## 2018-10-23 DIAGNOSIS — Z510 Encounter for antineoplastic radiation therapy: Secondary | ICD-10-CM | POA: Diagnosis not present

## 2018-10-26 ENCOUNTER — Ambulatory Visit
Admission: RE | Admit: 2018-10-26 | Discharge: 2018-10-26 | Disposition: A | Discharge status: Home / Self Care | Payer: Medicare Other | Source: Ambulatory Visit | Attending: Radiation Oncology | Admitting: Radiation Oncology

## 2018-10-26 DIAGNOSIS — Z510 Encounter for antineoplastic radiation therapy: Secondary | ICD-10-CM | POA: Diagnosis not present

## 2018-10-27 ENCOUNTER — Other Ambulatory Visit: Payer: Self-pay

## 2018-10-27 ENCOUNTER — Ambulatory Visit
Admission: RE | Admit: 2018-10-27 | Discharge: 2018-10-27 | Disposition: A | Discharge status: Home / Self Care | Payer: Medicare Other | Source: Ambulatory Visit | Attending: Radiation Oncology | Admitting: Radiation Oncology

## 2018-10-27 ENCOUNTER — Inpatient Hospital Stay: Payer: Medicare Other | Attending: Radiation Oncology

## 2018-10-27 DIAGNOSIS — C61 Malignant neoplasm of prostate: Secondary | ICD-10-CM | POA: Insufficient documentation

## 2018-10-27 DIAGNOSIS — Z510 Encounter for antineoplastic radiation therapy: Secondary | ICD-10-CM | POA: Diagnosis not present

## 2018-10-27 LAB — CBC
HEMATOCRIT: 47.5 % (ref 39.0–52.0)
Hemoglobin: 15.9 g/dL (ref 13.0–17.0)
MCH: 31.2 pg (ref 26.0–34.0)
MCHC: 33.5 g/dL (ref 30.0–36.0)
MCV: 93.1 fL (ref 80.0–100.0)
NRBC: 0 % (ref 0.0–0.2)
Platelets: 135 10*3/uL — ABNORMAL LOW (ref 150–400)
RBC: 5.1 MIL/uL (ref 4.22–5.81)
RDW: 12.5 % (ref 11.5–15.5)
WBC: 4.3 10*3/uL (ref 4.0–10.5)

## 2018-10-28 DIAGNOSIS — C61 Malignant neoplasm of prostate: Secondary | ICD-10-CM

## 2018-10-28 HISTORY — DX: Malignant neoplasm of prostate: C61

## 2018-10-29 ENCOUNTER — Ambulatory Visit
Admission: RE | Admit: 2018-10-29 | Discharge: 2018-10-29 | Disposition: A | Discharge status: Home / Self Care | Payer: Medicare Other | Source: Ambulatory Visit | Attending: Radiation Oncology | Admitting: Radiation Oncology

## 2018-10-29 DIAGNOSIS — C61 Malignant neoplasm of prostate: Secondary | ICD-10-CM | POA: Diagnosis not present

## 2018-10-29 DIAGNOSIS — Z510 Encounter for antineoplastic radiation therapy: Secondary | ICD-10-CM | POA: Diagnosis present

## 2018-10-30 ENCOUNTER — Ambulatory Visit
Admission: RE | Admit: 2018-10-30 | Discharge: 2018-10-30 | Disposition: A | Discharge status: Home / Self Care | Payer: Medicare Other | Source: Ambulatory Visit | Attending: Radiation Oncology | Admitting: Radiation Oncology

## 2018-10-30 DIAGNOSIS — Z510 Encounter for antineoplastic radiation therapy: Secondary | ICD-10-CM | POA: Diagnosis not present

## 2018-11-02 ENCOUNTER — Ambulatory Visit
Admission: RE | Admit: 2018-11-02 | Discharge: 2018-11-02 | Disposition: A | Discharge status: Home / Self Care | Payer: Medicare Other | Source: Ambulatory Visit | Attending: Radiation Oncology | Admitting: Radiation Oncology

## 2018-11-02 DIAGNOSIS — Z510 Encounter for antineoplastic radiation therapy: Secondary | ICD-10-CM | POA: Diagnosis not present

## 2018-11-03 ENCOUNTER — Ambulatory Visit
Admission: RE | Admit: 2018-11-03 | Discharge: 2018-11-03 | Disposition: A | Discharge status: Home / Self Care | Payer: Medicare Other | Source: Ambulatory Visit | Attending: Radiation Oncology | Admitting: Radiation Oncology

## 2018-11-03 ENCOUNTER — Other Ambulatory Visit: Payer: Self-pay | Admitting: *Deleted

## 2018-11-03 DIAGNOSIS — Z510 Encounter for antineoplastic radiation therapy: Secondary | ICD-10-CM | POA: Diagnosis not present

## 2018-11-03 MED ORDER — TAMSULOSIN HCL 0.4 MG PO CAPS: 0.4000 mg | ORAL_CAPSULE | Freq: Every day | ORAL | 6 refills | Status: DC

## 2018-11-04 ENCOUNTER — Ambulatory Visit
Admission: RE | Admit: 2018-11-04 | Discharge: 2018-11-04 | Disposition: A | Discharge status: Home / Self Care | Payer: Medicare Other | Source: Ambulatory Visit | Attending: Radiation Oncology | Admitting: Radiation Oncology

## 2018-11-04 DIAGNOSIS — Z510 Encounter for antineoplastic radiation therapy: Secondary | ICD-10-CM | POA: Diagnosis not present

## 2018-11-05 ENCOUNTER — Ambulatory Visit
Admission: RE | Admit: 2018-11-05 | Discharge: 2018-11-05 | Disposition: A | Discharge status: Home / Self Care | Payer: Medicare Other | Source: Ambulatory Visit | Attending: Radiation Oncology | Admitting: Radiation Oncology

## 2018-11-05 DIAGNOSIS — Z510 Encounter for antineoplastic radiation therapy: Secondary | ICD-10-CM | POA: Diagnosis not present

## 2018-11-06 ENCOUNTER — Ambulatory Visit
Admission: RE | Admit: 2018-11-06 | Discharge: 2018-11-06 | Disposition: A | Discharge status: Home / Self Care | Payer: Medicare Other | Source: Ambulatory Visit | Attending: Radiation Oncology | Admitting: Radiation Oncology

## 2018-11-06 DIAGNOSIS — Z510 Encounter for antineoplastic radiation therapy: Secondary | ICD-10-CM | POA: Diagnosis not present

## 2018-11-09 ENCOUNTER — Ambulatory Visit
Admission: RE | Admit: 2018-11-09 | Discharge: 2018-11-09 | Disposition: A | Discharge status: Home / Self Care | Payer: Medicare Other | Source: Ambulatory Visit | Attending: Radiation Oncology | Admitting: Radiation Oncology

## 2018-11-09 DIAGNOSIS — Z510 Encounter for antineoplastic radiation therapy: Secondary | ICD-10-CM | POA: Diagnosis not present

## 2018-11-10 ENCOUNTER — Inpatient Hospital Stay: Payer: Medicare Other | Attending: Radiation Oncology

## 2018-11-10 ENCOUNTER — Ambulatory Visit
Admission: RE | Admit: 2018-11-10 | Discharge: 2018-11-10 | Disposition: A | Discharge status: Home / Self Care | Payer: Medicare Other | Source: Ambulatory Visit | Attending: Radiation Oncology | Admitting: Radiation Oncology

## 2018-11-10 DIAGNOSIS — Z510 Encounter for antineoplastic radiation therapy: Secondary | ICD-10-CM | POA: Diagnosis not present

## 2018-11-11 ENCOUNTER — Ambulatory Visit
Admission: RE | Admit: 2018-11-11 | Discharge: 2018-11-11 | Disposition: A | Discharge status: Home / Self Care | Payer: Medicare Other | Source: Ambulatory Visit | Attending: Radiation Oncology | Admitting: Radiation Oncology

## 2018-11-11 DIAGNOSIS — Z510 Encounter for antineoplastic radiation therapy: Secondary | ICD-10-CM | POA: Diagnosis not present

## 2018-11-12 ENCOUNTER — Ambulatory Visit
Admission: RE | Admit: 2018-11-12 | Discharge: 2018-11-12 | Disposition: A | Discharge status: Home / Self Care | Payer: Medicare Other | Source: Ambulatory Visit | Attending: Radiation Oncology | Admitting: Radiation Oncology

## 2018-11-12 DIAGNOSIS — Z510 Encounter for antineoplastic radiation therapy: Secondary | ICD-10-CM | POA: Diagnosis not present

## 2018-11-13 ENCOUNTER — Ambulatory Visit
Admission: RE | Admit: 2018-11-13 | Discharge: 2018-11-13 | Disposition: A | Discharge status: Home / Self Care | Payer: Medicare Other | Source: Ambulatory Visit | Attending: Radiation Oncology | Admitting: Radiation Oncology

## 2018-11-13 DIAGNOSIS — Z510 Encounter for antineoplastic radiation therapy: Secondary | ICD-10-CM | POA: Diagnosis not present

## 2018-11-16 ENCOUNTER — Ambulatory Visit: Payer: Medicare Other

## 2018-11-17 ENCOUNTER — Ambulatory Visit
Admission: RE | Admit: 2018-11-17 | Discharge: 2018-11-17 | Disposition: A | Discharge status: Home / Self Care | Payer: Medicare Other | Source: Ambulatory Visit | Attending: Radiation Oncology | Admitting: Radiation Oncology

## 2018-11-17 DIAGNOSIS — Z510 Encounter for antineoplastic radiation therapy: Secondary | ICD-10-CM | POA: Diagnosis not present

## 2018-11-18 ENCOUNTER — Ambulatory Visit
Admission: RE | Admit: 2018-11-18 | Discharge: 2018-11-18 | Disposition: A | Discharge status: Home / Self Care | Payer: Medicare Other | Source: Ambulatory Visit | Attending: Radiation Oncology | Admitting: Radiation Oncology

## 2018-11-18 DIAGNOSIS — Z510 Encounter for antineoplastic radiation therapy: Secondary | ICD-10-CM | POA: Diagnosis not present

## 2018-11-19 ENCOUNTER — Ambulatory Visit
Admission: RE | Admit: 2018-11-19 | Discharge: 2018-11-19 | Disposition: A | Discharge status: Home / Self Care | Payer: Medicare Other | Source: Ambulatory Visit | Attending: Radiation Oncology | Admitting: Radiation Oncology

## 2018-11-19 DIAGNOSIS — Z510 Encounter for antineoplastic radiation therapy: Secondary | ICD-10-CM | POA: Diagnosis not present

## 2018-11-20 ENCOUNTER — Ambulatory Visit: Payer: Medicare Other

## 2018-11-23 ENCOUNTER — Ambulatory Visit: Payer: Medicare Other

## 2018-11-23 ENCOUNTER — Ambulatory Visit
Admission: RE | Admit: 2018-11-23 | Discharge: 2018-11-23 | Disposition: A | Discharge status: Home / Self Care | Payer: Medicare Other | Source: Ambulatory Visit | Attending: Radiation Oncology | Admitting: Radiation Oncology

## 2018-11-23 DIAGNOSIS — Z510 Encounter for antineoplastic radiation therapy: Secondary | ICD-10-CM | POA: Diagnosis not present

## 2018-11-24 ENCOUNTER — Ambulatory Visit
Admission: RE | Admit: 2018-11-24 | Discharge: 2018-11-24 | Disposition: A | Discharge status: Home / Self Care | Payer: Medicare Other | Source: Ambulatory Visit | Attending: Radiation Oncology | Admitting: Radiation Oncology

## 2018-11-24 ENCOUNTER — Inpatient Hospital Stay: Payer: Medicare Other

## 2018-11-24 DIAGNOSIS — Z510 Encounter for antineoplastic radiation therapy: Secondary | ICD-10-CM | POA: Diagnosis not present

## 2018-11-25 ENCOUNTER — Ambulatory Visit
Admission: RE | Admit: 2018-11-25 | Discharge: 2018-11-25 | Disposition: A | Discharge status: Home / Self Care | Payer: Medicare Other | Source: Ambulatory Visit | Attending: Radiation Oncology | Admitting: Radiation Oncology

## 2018-11-25 DIAGNOSIS — Z510 Encounter for antineoplastic radiation therapy: Secondary | ICD-10-CM | POA: Diagnosis not present

## 2018-11-26 ENCOUNTER — Ambulatory Visit
Admission: RE | Admit: 2018-11-26 | Discharge: 2018-11-26 | Disposition: A | Discharge status: Home / Self Care | Payer: Medicare Other | Source: Ambulatory Visit | Attending: Radiation Oncology | Admitting: Radiation Oncology

## 2018-11-26 DIAGNOSIS — Z510 Encounter for antineoplastic radiation therapy: Secondary | ICD-10-CM | POA: Diagnosis not present

## 2018-11-27 ENCOUNTER — Ambulatory Visit
Admission: RE | Admit: 2018-11-27 | Discharge: 2018-11-27 | Disposition: A | Discharge status: Home / Self Care | Payer: Medicare Other | Source: Ambulatory Visit | Attending: Radiation Oncology | Admitting: Radiation Oncology

## 2018-11-27 DIAGNOSIS — Z510 Encounter for antineoplastic radiation therapy: Secondary | ICD-10-CM | POA: Diagnosis not present

## 2018-11-30 ENCOUNTER — Ambulatory Visit
Admission: RE | Admit: 2018-11-30 | Discharge: 2018-11-30 | Disposition: A | Discharge status: Home / Self Care | Payer: Medicare Other | Source: Ambulatory Visit | Attending: Radiation Oncology | Admitting: Radiation Oncology

## 2018-11-30 DIAGNOSIS — Z510 Encounter for antineoplastic radiation therapy: Secondary | ICD-10-CM | POA: Diagnosis not present

## 2018-11-30 DIAGNOSIS — C61 Malignant neoplasm of prostate: Secondary | ICD-10-CM | POA: Insufficient documentation

## 2018-12-01 ENCOUNTER — Ambulatory Visit
Admission: RE | Admit: 2018-12-01 | Discharge: 2018-12-01 | Disposition: A | Discharge status: Home / Self Care | Payer: Medicare Other | Source: Ambulatory Visit | Attending: Radiation Oncology | Admitting: Radiation Oncology

## 2018-12-01 DIAGNOSIS — Z510 Encounter for antineoplastic radiation therapy: Secondary | ICD-10-CM | POA: Diagnosis not present

## 2018-12-02 ENCOUNTER — Ambulatory Visit
Admission: RE | Admit: 2018-12-02 | Discharge: 2018-12-02 | Disposition: A | Discharge status: Home / Self Care | Payer: Medicare Other | Source: Ambulatory Visit | Attending: Radiation Oncology | Admitting: Radiation Oncology

## 2018-12-02 DIAGNOSIS — Z510 Encounter for antineoplastic radiation therapy: Secondary | ICD-10-CM | POA: Diagnosis not present

## 2018-12-03 ENCOUNTER — Ambulatory Visit
Admission: RE | Admit: 2018-12-03 | Discharge: 2018-12-03 | Disposition: A | Discharge status: Home / Self Care | Payer: Medicare Other | Source: Ambulatory Visit | Attending: Radiation Oncology | Admitting: Radiation Oncology

## 2018-12-03 DIAGNOSIS — Z510 Encounter for antineoplastic radiation therapy: Secondary | ICD-10-CM | POA: Diagnosis not present

## 2018-12-04 ENCOUNTER — Ambulatory Visit
Admission: RE | Admit: 2018-12-04 | Discharge: 2018-12-04 | Disposition: A | Discharge status: Home / Self Care | Payer: Medicare Other | Source: Ambulatory Visit | Attending: Radiation Oncology | Admitting: Radiation Oncology

## 2018-12-04 DIAGNOSIS — Z510 Encounter for antineoplastic radiation therapy: Secondary | ICD-10-CM | POA: Diagnosis not present

## 2018-12-07 ENCOUNTER — Ambulatory Visit: Payer: Medicare Other

## 2018-12-07 ENCOUNTER — Ambulatory Visit
Admission: RE | Admit: 2018-12-07 | Discharge: 2018-12-07 | Disposition: A | Discharge status: Home / Self Care | Payer: Medicare Other | Source: Ambulatory Visit | Attending: Radiation Oncology | Admitting: Radiation Oncology

## 2018-12-07 DIAGNOSIS — Z510 Encounter for antineoplastic radiation therapy: Secondary | ICD-10-CM | POA: Diagnosis not present

## 2018-12-08 ENCOUNTER — Ambulatory Visit
Admission: RE | Admit: 2018-12-08 | Discharge: 2018-12-08 | Disposition: A | Discharge status: Home / Self Care | Payer: Medicare Other | Source: Ambulatory Visit | Attending: Radiation Oncology | Admitting: Radiation Oncology

## 2018-12-08 DIAGNOSIS — Z510 Encounter for antineoplastic radiation therapy: Secondary | ICD-10-CM | POA: Diagnosis not present

## 2018-12-09 ENCOUNTER — Ambulatory Visit
Admission: RE | Admit: 2018-12-09 | Discharge: 2018-12-09 | Disposition: A | Discharge status: Home / Self Care | Payer: Medicare Other | Source: Ambulatory Visit | Attending: Radiation Oncology | Admitting: Radiation Oncology

## 2018-12-09 ENCOUNTER — Ambulatory Visit: Payer: Medicare Other

## 2018-12-09 DIAGNOSIS — Z510 Encounter for antineoplastic radiation therapy: Secondary | ICD-10-CM | POA: Diagnosis not present

## 2018-12-10 ENCOUNTER — Ambulatory Visit
Admission: RE | Admit: 2018-12-10 | Discharge: 2018-12-10 | Disposition: A | Discharge status: Home / Self Care | Payer: Medicare Other | Source: Ambulatory Visit | Attending: Radiation Oncology | Admitting: Radiation Oncology

## 2018-12-10 DIAGNOSIS — Z510 Encounter for antineoplastic radiation therapy: Secondary | ICD-10-CM | POA: Diagnosis not present

## 2018-12-11 ENCOUNTER — Ambulatory Visit
Admission: RE | Admit: 2018-12-11 | Discharge: 2018-12-11 | Disposition: A | Discharge status: Home / Self Care | Payer: Medicare Other | Source: Ambulatory Visit | Attending: Radiation Oncology | Admitting: Radiation Oncology

## 2018-12-11 ENCOUNTER — Ambulatory Visit: Payer: Medicare Other

## 2018-12-11 DIAGNOSIS — Z510 Encounter for antineoplastic radiation therapy: Secondary | ICD-10-CM | POA: Diagnosis not present

## 2018-12-14 ENCOUNTER — Ambulatory Visit: Payer: Medicare Other

## 2019-01-13 ENCOUNTER — Other Ambulatory Visit: Payer: Self-pay

## 2019-01-14 ENCOUNTER — Encounter: Payer: Self-pay | Admitting: Radiation Oncology

## 2019-01-14 ENCOUNTER — Other Ambulatory Visit: Payer: Self-pay

## 2019-01-14 ENCOUNTER — Ambulatory Visit
Admission: RE | Admit: 2019-01-14 | Discharge: 2019-01-14 | Disposition: A | Discharge status: Home / Self Care | Payer: Medicare Other | Source: Ambulatory Visit | Attending: Radiation Oncology | Admitting: Radiation Oncology

## 2019-01-14 VITALS — BP 129/81 | HR 64 | Temp 95.3°F | Resp 18 | Wt 244.7 lb

## 2019-01-14 DIAGNOSIS — C61 Malignant neoplasm of prostate: Secondary | ICD-10-CM | POA: Insufficient documentation

## 2019-01-14 DIAGNOSIS — R3915 Urgency of urination: Secondary | ICD-10-CM | POA: Insufficient documentation

## 2019-01-14 DIAGNOSIS — R197 Diarrhea, unspecified: Secondary | ICD-10-CM | POA: Insufficient documentation

## 2019-01-14 DIAGNOSIS — K5900 Constipation, unspecified: Secondary | ICD-10-CM | POA: Insufficient documentation

## 2019-01-14 DIAGNOSIS — Z923 Personal history of irradiation: Secondary | ICD-10-CM | POA: Diagnosis not present

## 2019-01-14 NOTE — Progress Notes (Signed)
Radiation Oncology Follow up Note  Name: John [name]   Date:   01/14/2019 MRN:  [id number] DOB: 09/16/1941    This 78 y.o. male presents to the clinic today for one-month follow-up status post external beam radiation therapy for Gleason 6 adenocarcinoma the prostate presenting the PSA of 8.8.  REFERRING PROVIDER: Juluis Pitch, MD  HPI: patient is a 78 year old male now one month out having completed external beam image guided radiation therapy to his prostate for a Gleason 6 adenocarcinoma. Seen today in routine follow-up he is doing fairly well. Still has some intermittent diarrhea as well as constipation. Does have some urgency of urination and frequency. He is otherwise without complaint..  COMPLICATIONS OF TREATMENT: none  FOLLOW UP COMPLIANCE: keeps appointments   PHYSICAL EXAM:  BP 129/81 (BP Location: Left Arm, Patient Position: Sitting)   Pulse 64   Temp (!) 95.3 F (35.2 C) (Tympanic)   Resp 18   Wt 244 lb 11.4 oz (111 kg)   BMI 31.42 kg/m  Well-developed well-nourished patient in NAD. HEENT reveals PERLA, EOMI, discs not visualized.  Oral cavity is clear. No oral mucosal lesions are identified. Neck is clear without evidence of cervical or supraclavicular adenopathy. Lungs are clear to A&P. Cardiac examination is essentially unremarkable with regular rate and rhythm without murmur rub or thrill. Abdomen is benign with no organomegaly or masses noted. Motor sensory and DTR levels are equal and symmetric in the upper and lower extremities. Cranial nerves II through XII are grossly intact. Proprioception is intact. No peripheral adenopathy or edema is identified. No motor or sensory levels are noted. Crude visual fields are within normal range.  RADIOLOGY RESULTS: no current films for review  PLAN: present time patient is doing fairly well. He continues to have some increased lower urinary tract symptoms. Diarrhea has improving. He is not following any regular diet. I've asked  to see him back in 3 months for follow-up with a PSA prior to that visit. Patient is to call sooner with any concerns.  I would like to take this opportunity to thank you for allowing me to participate in the care of your patient.Noreene Filbert, MD

## 2019-03-03 ENCOUNTER — Encounter: Payer: Self-pay | Admitting: Urology

## 2019-03-03 ENCOUNTER — Other Ambulatory Visit: Payer: Self-pay

## 2019-03-03 ENCOUNTER — Ambulatory Visit: Payer: Medicare Other | Admitting: Urology

## 2019-03-03 ENCOUNTER — Ambulatory Visit (INDEPENDENT_AMBULATORY_CARE_PROVIDER_SITE_OTHER): Payer: Medicare Other | Admitting: Urology

## 2019-03-03 VITALS — BP 112/64 | HR 56 | Ht 74.0 in | Wt 240.0 lb

## 2019-03-03 DIAGNOSIS — R3915 Urgency of urination: Secondary | ICD-10-CM

## 2019-03-03 DIAGNOSIS — C61 Malignant neoplasm of prostate: Secondary | ICD-10-CM

## 2019-03-04 ENCOUNTER — Telehealth: Payer: Self-pay

## 2019-03-04 ENCOUNTER — Ambulatory Visit: Payer: Medicare Other

## 2019-03-04 LAB — PSA: Prostate Specific Ag, Serum: <0.1 ng/mL (ref 0.0–4.0)

## 2019-03-04 NOTE — Telephone Encounter (Signed)
-----   Message from Hollice Espy, MD sent at 03/04/2019  9:25 AM EDT ----- PSA is undetectable as suspected.  See you in 6 months.Hollice Espy, MD

## 2019-03-04 NOTE — Progress Notes (Signed)
03/03/2019 9:48 AM   [name] [birth year] [id number]  Referring provider: Juluis Pitch, MD [phone] [place], [place] [address]  Chief Complaint  Patient presents with  . Prostate Cancer    6 month    HPI: 78 year old male with a personal history of prostate cancer returns today for routine follow-up.  He has a personal history of low risk prostate cancer as well as rising PSA up to 17.7.  Ultimately, he elected to undergo treatment with IMRT (completed 2/20) as well as 6 months of Lupron.  Please see previous notes for details.  He reports that during radiation, he had increased urgency and frequency.  This is improving slowly.  He is currently on Flomax only.  His bowel symptoms have also subsided.  Overall, he is doing well from a urinary standpoint.  He has completed 6 months of ADT.  He has intermittent hot flashes but otherwise has tolerated the medication well.    PMH: Past Medical History:  Diagnosis Date  . Cancer ([place])   . Depression   . Depression   . 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 Eczema   . Elevated PSA   . H/O Osgood-Schlatter disease   . Hand deformity, congenital   . Hearing loss bilateral  . Hemorrhoids   . Horner syndrome   . Hypertension   . Migraine   . Obesity   . Rosacea   . Sleep apnea     Surgical History: Past Surgical History:  Procedure Laterality Date  . ANKLE ARTHROSCOPY    . CHOLECYSTECTOMY    . RETINAL DETACHMENT SURGERY    . SUBDURAL HEMATOMA EVACUATION VIA CRANIOTOMY    . THUMB FUSION    . TREATMENT FISTULA ANAL      Home Medications:  Allergies as of 03/03/2019   No Known Allergies     Medication List       Accurate as of Mar 03, 2019 11:59 PM. If you have any questions, ask your nurse or doctor.        STOP taking these medications   finasteride 5 MG tablet Commonly known as:  PROSCAR Stopped by:  Hollice Espy, MD     TAKE these medications   aspirin EC 81 MG tablet Take by mouth.    atenolol 50 MG tablet Commonly known as:  TENORMIN Take 50 mg by mouth daily.   levETIRAcetam 500 MG tablet Commonly known as:  KEPPRA Take by mouth.   rizatriptan 10 MG tablet Commonly known as:  MAXALT   tamsulosin 0.4 MG Caps capsule Commonly known as:  FLOMAX Take 1 capsule (0.4 mg total) by mouth daily after supper.   venlafaxine 37.5 MG tablet Commonly known as:  EFFEXOR Take 37.5 mg by mouth daily.       Allergies: No Known Allergies  Family History: Family History  Problem Relation Age of Onset  . Osteoporosis Mother   . Depression Mother   . Prostate cancer Father   . Hypertension Father   . Kidney disease Neg Hx     Social History:  reports that he has never smoked. He has never used smokeless tobacco. He reports current alcohol use. He reports that he does not use drugs.  ROS: UROLOGY Frequent Urination?: No Hard to postpone urination?: Yes Burning/pain with urination?: No Get up at night to urinate?: Yes Leakage of urine?: No Urine stream starts and stops?: No Trouble starting stream?: No Do you have to strain to urinate?: No Blood  in urine?: No Urinary tract infection?: No Sexually transmitted disease?: No Injury to kidneys or bladder?: No Painful intercourse?: No Weak stream?: No Erection problems?: No Penile pain?: No  Gastrointestinal Nausea?: No Vomiting?: No Indigestion/heartburn?: No Diarrhea?: No Constipation?: No  Constitutional Fever: No Night sweats?: No Weight loss?: No Fatigue?: No  Skin Skin rash/lesions?: No Itching?: No  Eyes Blurred vision?: No Double vision?: No  Ears/Nose/Throat Sore throat?: No Sinus problems?: No  Hematologic/Lymphatic Swollen glands?: No Easy bruising?: No  Cardiovascular Leg swelling?: No Chest pain?: No  Respiratory Cough?: No Shortness of breath?: No  Endocrine Excessive thirst?: No  Musculoskeletal Back pain?: No Joint pain?: No  Neurological Headaches?: No  Dizziness?: No  Psychologic Depression?: Yes Anxiety?: No  Physical Exam: BP 112/64   Pulse (!) 56   Ht 6\' 2"  (1.88 m)   Wt 240 lb (108.9 kg)   BMI 30.81 kg/m   Constitutional:  Alert and oriented, No acute distress. HEENT: [place] AT, moist mucus membranes.  Trachea midline, no masses. Cardiovascular: No clubbing, cyanosis, or edema. Respiratory: Normal respiratory effort, no increased work of breathing. Skin: No rashes, bruises or suspicious lesions. Neurologic: Grossly intact, no focal deficits, moving all 4 extremities. Psychiatric: Normal mood and affect.  Laboratory Data: Lab Results  Component Value Date   WBC 4.3 10/27/2018   HGB 15.9 10/27/2018   HCT 47.5 10/27/2018   MCV 93.1 10/27/2018   PLT 135 (L) 10/27/2018    Lab Results  Component Value Date   CREATININE 0.80 05/21/2017    Pertinent Imaging: n/  Assessment & Plan:    1. Prostate cancer ([place]) S/p IMRT / ADT x 6 months We discussed risk of staging of prostate cancer, previous biopsy 3+3 but was never rebiopsied as his PSA rose, at least intermediate risk based on PSA We discussed guidelines for intermediate risk prostate cancer, recommend 6 months of ADT We will hold off on further ADT at this time after shared decision making discussion Plan for PSA to ensure suppression - PSA; Future - PSA  2. Urinary urgency Improving, likely secondary to radiation affect Anticipate continued improvement with time Avoid irritating beverages May hold flomax as possible   Return in about 6 months (around 09/03/2019) for PSA prior.  Hollice Espy, MD  [facility] [address] [place], [place] [address] [phone]

## 2019-04-13 ENCOUNTER — Other Ambulatory Visit: Payer: Self-pay

## 2019-04-13 ENCOUNTER — Telehealth: Payer: Self-pay | Admitting: Radiation Oncology

## 2019-04-13 NOTE — Telephone Encounter (Signed)
Spoke to pt and completed travel screen. Also explained about addl screening questions they will be asked, new guidelines about mask req, no visitors, and fever checks °

## 2019-04-14 ENCOUNTER — Other Ambulatory Visit: Payer: Self-pay

## 2019-04-14 ENCOUNTER — Inpatient Hospital Stay: Payer: Medicare Other | Attending: Radiation Oncology

## 2019-04-14 DIAGNOSIS — C61 Malignant neoplasm of prostate: Secondary | ICD-10-CM | POA: Insufficient documentation

## 2019-04-14 DIAGNOSIS — Z923 Personal history of irradiation: Secondary | ICD-10-CM | POA: Diagnosis not present

## 2019-04-14 LAB — PSA: Prostatic Specific Antigen: <0.01 ng/mL (ref 0.00–4.00)

## 2019-04-21 ENCOUNTER — Ambulatory Visit: Payer: Medicare Other | Admitting: Radiation Oncology

## 2019-04-24 ENCOUNTER — Other Ambulatory Visit: Payer: Self-pay | Admitting: Radiation Oncology

## 2019-04-27 ENCOUNTER — Other Ambulatory Visit: Payer: Self-pay

## 2019-04-28 ENCOUNTER — Other Ambulatory Visit: Payer: Self-pay

## 2019-04-28 ENCOUNTER — Encounter: Payer: Self-pay | Admitting: Radiation Oncology

## 2019-04-28 ENCOUNTER — Ambulatory Visit
Admission: RE | Admit: 2019-04-28 | Discharge: 2019-04-28 | Disposition: A | Discharge status: Home / Self Care | Payer: Medicare Other | Source: Ambulatory Visit | Attending: Radiation Oncology | Admitting: Radiation Oncology

## 2019-04-28 VITALS — Temp 95.9°F | Resp 18 | Wt 238.1 lb

## 2019-04-28 DIAGNOSIS — Z923 Personal history of irradiation: Secondary | ICD-10-CM | POA: Insufficient documentation

## 2019-04-28 DIAGNOSIS — C61 Malignant neoplasm of prostate: Secondary | ICD-10-CM | POA: Insufficient documentation

## 2019-04-28 NOTE — Progress Notes (Signed)
Radiation Oncology Follow up Note  Name: John [name]   Date:   04/28/2019 MRN:  [id number] DOB: 10-15-1941    This 78 y.o. male presents to the clinic today for 87-month follow-up status post IMRT radiation therapy to his prostate for Gleason 6 adenocarcinoma presenting with a PSA of 8.8.  REFERRING PROVIDER: Juluis Pitch, MD  HPI: Patient is a 78 year old male now out 4 months having completed IMRT radiation therapy to his prostate for Gleason 6 adenocarcinoma.  Seen today in routine follow-up he is doing well.  He does have some nocturia x3-4 is currently still on Flomax.  Specifically denies diarrhea or fatigue.  He did have one Lupron injection and that has been discontinued.  His most recent PSA is less than 0.01.Marland Kitchen  COMPLICATIONS OF TREATMENT: none  FOLLOW UP COMPLIANCE: keeps appointments   PHYSICAL EXAM:  Temp (!) 95.9 F (35.5 C) (Tympanic)   Resp 18   Wt 238 lb 1.6 oz (108 kg)   BMI 30.57 kg/m  Well-developed well-nourished patient in NAD. HEENT reveals PERLA, EOMI, discs not visualized.  Oral cavity is clear. No oral mucosal lesions are identified. Neck is clear without evidence of cervical or supraclavicular adenopathy. Lungs are clear to A&P. Cardiac examination is essentially unremarkable with regular rate and rhythm without murmur rub or thrill. Abdomen is benign with no organomegaly or masses noted. Motor sensory and DTR levels are equal and symmetric in the upper and lower extremities. Cranial nerves II through XII are grossly intact. Proprioception is intact. No peripheral adenopathy or edema is identified. No motor or sensory levels are noted. Crude visual fields are within normal range.  RADIOLOGY RESULTS: No current films for review  PLAN: Present time patient is under excellent biochemical control of his prostate cancer.  I am pleased with his overall progress.  I have asked to see him back in 6 months for follow-up with a PSA at that time.  Patient knows to call with  any concerns.  He continues on Flomax.  I would like to take this opportunity to thank you for allowing me to participate in the care of your patient.Noreene Filbert, MD

## 2019-11-03 ENCOUNTER — Other Ambulatory Visit: Payer: Self-pay

## 2019-11-03 DIAGNOSIS — C61 Malignant neoplasm of prostate: Secondary | ICD-10-CM

## 2019-11-04 ENCOUNTER — Other Ambulatory Visit: Payer: Self-pay

## 2019-11-04 ENCOUNTER — Inpatient Hospital Stay: Payer: Medicare Other | Attending: Radiation Oncology

## 2019-11-04 DIAGNOSIS — C61 Malignant neoplasm of prostate: Secondary | ICD-10-CM | POA: Insufficient documentation

## 2019-11-04 LAB — PSA: Prostatic Specific Antigen: 0.01 ng/mL (ref 0.00–4.00)

## 2019-11-11 ENCOUNTER — Ambulatory Visit
Admission: RE | Admit: 2019-11-11 | Discharge: 2019-11-11 | Disposition: A | Discharge status: Home / Self Care | Payer: Medicare Other | Source: Ambulatory Visit | Attending: Radiation Oncology | Admitting: Radiation Oncology

## 2019-11-11 ENCOUNTER — Other Ambulatory Visit: Payer: Self-pay

## 2019-11-11 ENCOUNTER — Encounter: Payer: Self-pay | Admitting: Radiation Oncology

## 2019-11-11 VITALS — BP 143/89 | HR 53 | Temp 97.0°F | Wt 249.4 lb

## 2019-11-11 DIAGNOSIS — Z923 Personal history of irradiation: Secondary | ICD-10-CM | POA: Diagnosis not present

## 2019-11-11 DIAGNOSIS — C61 Malignant neoplasm of prostate: Secondary | ICD-10-CM | POA: Diagnosis not present

## 2019-11-11 NOTE — Progress Notes (Signed)
Radiation Oncology Follow up Note  Name: John [name]   Date:   11/11/2019 MRN:  [id number] DOB: 1941-08-09    This 79 y.o. male presents to the clinic today for 51-month follow-up status post IMRT radiation therapy to his prostate for Gleason 6 (3+3) adenocarcinoma presenting with a PSA of 8.8.  REFERRING PROVIDER: Juluis Pitch, MD  HPI: Patient is a 79 year old male now at 10 months having completed IMRT radiation therapy for Gleason 6 adenocarcinoma.  He did have 1 Lupron injection although that has been discontinued.  He is doing well specifically denies any increased lower urinary tract symptoms diarrhea or fatigue.  His most recent PSA was [id number].  COMPLICATIONS OF TREATMENT: none  FOLLOW UP COMPLIANCE: keeps appointments   PHYSICAL EXAM:  BP (!) 143/89   Pulse (!) 53   Temp (!) 97 F (36.1 C)   Wt 249 lb 6.4 oz (113.1 kg)   BMI 32.02 kg/m  Well-developed well-nourished patient in NAD. HEENT reveals PERLA, EOMI, discs not visualized.  Oral cavity is clear. No oral mucosal lesions are identified. Neck is clear without evidence of cervical or supraclavicular adenopathy. Lungs are clear to A&P. Cardiac examination is essentially unremarkable with regular rate and rhythm without murmur rub or thrill. Abdomen is benign with no organomegaly or masses noted. Motor sensory and DTR levels are equal and symmetric in the upper and lower extremities. Cranial nerves II through XII are grossly intact. Proprioception is intact. No peripheral adenopathy or edema is identified. No motor or sensory levels are noted. Crude visual fields are within normal range.  RADIOLOGY RESULTS: No current films to review  PLAN: Present time patient is under excellent biochemical control of his prostate cancer.  I am pleased with his overall progress and low side effect profile.  I have asked to see him back in 1 year for follow-up.  Patient knows to call sooner with any concerns.  I would like to take this  opportunity to thank you for allowing me to participate in the care of your patient.Noreene Filbert, MD

## 2020-02-17 ENCOUNTER — Other Ambulatory Visit: Payer: Self-pay | Admitting: *Deleted

## 2020-02-17 DIAGNOSIS — Z8679 Personal history of other diseases of the circulatory system: Secondary | ICD-10-CM

## 2020-02-23 ENCOUNTER — Telehealth (HOSPITAL_COMMUNITY): Payer: Self-pay

## 2020-02-23 NOTE — Telephone Encounter (Signed)

## 2020-02-24 ENCOUNTER — Ambulatory Visit (HOSPITAL_COMMUNITY)
Admission: RE | Admit: 2020-02-24 | Discharge: 2020-02-24 | Disposition: A | Discharge status: Home / Self Care | Payer: Medicare Other | Source: Ambulatory Visit | Attending: Surgery | Admitting: Surgery

## 2020-02-24 ENCOUNTER — Ambulatory Visit (INDEPENDENT_AMBULATORY_CARE_PROVIDER_SITE_OTHER): Payer: Medicare Other | Admitting: Physician Assistant

## 2020-02-24 ENCOUNTER — Other Ambulatory Visit: Payer: Self-pay

## 2020-02-24 VITALS — BP 144/85 | HR 58 | Temp 98.4°F | Resp 20 | Ht 74.0 in | Wt 241.8 lb

## 2020-02-24 DIAGNOSIS — Z8679 Personal history of other diseases of the circulatory system: Secondary | ICD-10-CM | POA: Insufficient documentation

## 2020-02-24 NOTE — Progress Notes (Signed)
Office Note     CC:  follow up Requesting Provider:  Juluis Pitch, MD  HPI: John [name] is a 79 y.o. 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  This was manifested by drooping of his right eyelid and diagnosed with Horner's syndrome.  He was treated with six months of Plavix then transitioned to aspirin 81mg  daily.  He relocated to this area and Dr. Trula Slade assumed follow-up care in 2018.  He denies monocular blindness, facial drooping, slurred speech or extremity weakness  There is no past hx of CVA or TIA.  He has a seizure disorder.  Besides aspirin, he is maintained on a BB.  He is not diabetic.  He has never smoked.  Previous duplex revealed approx. 1-39% bilateral ICA stenoses.   Past Medical History:  Diagnosis Date  . Cancer ([place])   . Depression   . Depression   . 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 Eczema   . Elevated PSA   . H/O Osgood-Schlatter disease   . Hand deformity, congenital   . Hearing loss bilateral  . Hemorrhoids   . Horner syndrome   . Hypertension   . Migraine   . Obesity   . Rosacea   . Sleep apnea     Past Surgical History:  Procedure Laterality Date  . ANKLE ARTHROSCOPY    . CHOLECYSTECTOMY    . RETINAL DETACHMENT SURGERY    . SUBDURAL HEMATOMA EVACUATION VIA CRANIOTOMY    . THUMB FUSION    . TREATMENT FISTULA ANAL      Social History   Socioeconomic History  . Marital status: Married    Spouse name: Not on file  . Number of children: Not on file  . Years of education: Not on file  . Highest education level: Not on file  Occupational History  . Not on file  Tobacco Use  . Smoking status: Never Smoker  . Smokeless tobacco: Never Used  Substance and Sexual Activity  . Alcohol use: Yes    Alcohol/week: 0.0 standard drinks    Comment: occ  . Drug use: No  . Sexual activity: Yes    Birth control/protection: None  Other Topics Concern  . Not on file  Social  History Narrative  . Not on file   Social Determinants of Health   Financial Resource Strain:   . Difficulty of Paying Living Expenses:   Food Insecurity:   . Worried About Charity fundraiser in the Last Year:   . Arboriculturist in the Last Year:   Transportation Needs:   . Film/video editor (Medical):   Marland Kitchen Lack of Transportation (Non-Medical):   Physical Activity:   . Days of Exercise per Week:   . Minutes of Exercise per Session:   Stress:   . Feeling of Stress :   Social Connections:   . Frequency of Communication with Friends and Family:   . Frequency of Social Gatherings with Friends and Family:   . Attends Religious Services:   . Active Member of Clubs or Organizations:   . Attends Archivist Meetings:   Marland Kitchen Marital Status:   Intimate Partner Violence:   . Fear of Current or Ex-Partner:   . Emotionally Abused:   Marland Kitchen Physically Abused:   . Sexually Abused:    Family History  Problem Relation Age of Onset  . Osteoporosis Mother   . Depression Mother   . Prostate cancer  Father   . Hypertension Father   . Kidney disease Neg Hx     Current Outpatient Medications  Medication Sig Dispense Refill  . aspirin EC 81 MG tablet Take by mouth.    Marland Kitchen atenolol (TENORMIN) 50 MG tablet Take 50 mg by mouth daily.  0  . levETIRAcetam (KEPPRA) 500 MG tablet Take by mouth.    . rizatriptan (MAXALT) 10 MG tablet     . SUMAtriptan (IMITREX) 100 MG tablet TAKE 1 TABLET BY MOUTH ONCE AS NEEDED FOR MIGRAINE. MAY REPEAT 2ND DOSE AFTER 2 HOUR IF NEEDED    . tamsulosin (FLOMAX) 0.4 MG CAPS capsule TAKE 1 CAPSULE (0.4 MG TOTAL) BY MOUTH DAILY AFTER SUPPER. 90 capsule 3  . venlafaxine (EFFEXOR) 37.5 MG tablet Take 37.5 mg by mouth daily.     Current Facility-Administered Medications  Medication Dose Route Frequency Provider Last Rate Last Admin  . gentamicin (GARAMYCIN) injection 80 mg  80 mg Intramuscular Once Hollice Espy, MD      . levofloxacin [facility]) tablet 500 mg   500 mg Oral Once Hollice Espy, MD        No Known Allergies   REVIEW OF SYSTEMS:   [X]  denotes positive finding, [ ]  denotes negative finding Cardiac  Comments:  Chest pain or chest pressure:    Shortness of breath upon exertion:    Short of breath when lying flat:    Irregular heart rhythm:        Vascular    Pain in calf, thigh, or hip brought on by ambulation:    Pain in feet at night that wakes you up from your sleep:     Blood clot in your veins:    Leg swelling:         Pulmonary    Oxygen at home:    Productive cough:     Wheezing:         Neurologic    Sudden weakness in arms or legs:     Sudden numbness in arms or legs:     Sudden onset of difficulty speaking or slurred speech:    Temporary loss of vision in one eye:     Problems with dizziness:         Gastrointestinal    Blood in stool:     Vomited blood:         Genitourinary    Burning when urinating:     Blood in urine:        Psychiatric    Major depression:         Hematologic    Bleeding problems:    Problems with blood clotting too easily:        Skin    Rashes or ulcers:        Constitutional    Fever or chills:      PHYSICAL EXAMINATION:  Vitals:   02/24/20 1535  Weight: 241 lb 12.8 oz (109.7 kg)  Height: 6\' 2"  (1.88 m)   General:  WDWN in NAD; vital signs documented above Gait: No ataxia HENT: WNL, normocephalic Pulmonary: normal non-labored breathing , without Rales, rhonchi,  wheezing Cardiac: regular HR, without  Murmurs without carotid bruits Abdomen: soft, NT, no masses Skin: without rashes Vascular Exam/Pulses: 2+ brachial, radial, femoral, popliteal, dorsalis pedis and posterior tibial pulses bilaterally Extremities: without ischemic changes, without Gangrene , without cellulitis; without open wounds;  Musculoskeletal: no muscle wasting or atrophy  Neurologic: A&O X 3;  No focal weakness or  paresthesias are detected Psychiatric:  The pt has Normal affect.    Non-Invasive Vascular Imaging:   Right Carotid: Velocities in the right ICA are consistent with a 1-39%  stenosis.   Left Carotid: Velocities in the left ICA are consistent with a 1-39%  stenosis.   Vertebrals: Bilateral vertebral arteries demonstrate antegrade flow.  Subclavians: Normal flow hemodynamics were seen in bilateral subclavian        arteries.     ASSESSMENT/PLAN:: 79 y.o. 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  He is asymptomatic.  Carotid duplex is unchanged as compared to May 2019.  He is compliant with his aspirin therapy.  We reviewed signs and symptoms of stroke and TIAs.  We recommend annual follow-up with carotid duplex.    Barbie Banner, PA-C [facility] [phone]  Clinic MD:   Oneida Alar

## 2020-02-28 ENCOUNTER — Other Ambulatory Visit: Payer: Self-pay | Admitting: *Deleted

## 2020-02-28 DIAGNOSIS — Z8679 Personal history of other diseases of the circulatory system: Secondary | ICD-10-CM

## 2020-02-28 DIAGNOSIS — I6523 Occlusion and stenosis of bilateral carotid arteries: Secondary | ICD-10-CM

## 2020-05-26 ENCOUNTER — Other Ambulatory Visit: Payer: Self-pay | Admitting: Radiation Oncology

## 2020-11-08 ENCOUNTER — Other Ambulatory Visit: Payer: Self-pay | Admitting: *Deleted

## 2020-11-08 ENCOUNTER — Other Ambulatory Visit: Payer: Self-pay

## 2020-11-08 ENCOUNTER — Inpatient Hospital Stay: Payer: Medicare Other | Attending: Radiation Oncology

## 2020-11-08 DIAGNOSIS — Z923 Personal history of irradiation: Secondary | ICD-10-CM | POA: Diagnosis not present

## 2020-11-08 DIAGNOSIS — C61 Malignant neoplasm of prostate: Secondary | ICD-10-CM | POA: Diagnosis present

## 2020-11-08 LAB — PSA: Prostatic Specific Antigen: 0.04 ng/mL (ref 0.00–4.00)

## 2020-11-15 ENCOUNTER — Encounter: Payer: Self-pay | Admitting: Radiation Oncology

## 2020-11-15 ENCOUNTER — Ambulatory Visit
Admission: RE | Admit: 2020-11-15 | Discharge: 2020-11-15 | Disposition: A | Discharge status: Home / Self Care | Payer: Medicare Other | Source: Ambulatory Visit | Attending: Radiation Oncology | Admitting: Radiation Oncology

## 2020-11-15 ENCOUNTER — Other Ambulatory Visit: Payer: Self-pay

## 2020-11-15 VITALS — BP 138/75 | HR 59 | Temp 98.5°F | Resp 16 | Wt 239.1 lb

## 2020-11-15 DIAGNOSIS — C61 Malignant neoplasm of prostate: Secondary | ICD-10-CM | POA: Insufficient documentation

## 2020-11-15 DIAGNOSIS — Z923 Personal history of irradiation: Secondary | ICD-10-CM | POA: Diagnosis not present

## 2020-11-15 NOTE — Progress Notes (Signed)
Radiation Oncology Follow up Note  Name: Erica Osuna   Date:   11/15/2020 MRN:  [id number] DOB: [birth year]    This 80 y.o. male presents to the clinic today for 2-year follow-up status post IMRT radiation therapy to his prostate for Gleason 6 adenocarcinoma presenting with a PSA of 8.8.  REFERRING PROVIDER: Juluis Pitch, MD  HPI: Patient is a 80 year old male now about 2 years having completed IMRT radiation therapy to his prostate for Gleason 6 adenocarcinoma.  He is seen today in routine follow-up.  He is a very low side effect profile specifically denies diarrhea dysuria or any other GI GU complaints.  Most recent PSA is 0.04.  Slight uptake from 0.01-year ago.  Patient specifically denies any bone pain.  COMPLICATIONS OF TREATMENT: none  FOLLOW UP COMPLIANCE: keeps appointments   PHYSICAL EXAM:  BP 138/75 (BP Location: Left Arm, Patient Position: Sitting, Cuff Size: Normal)   Pulse (!) 59   Temp 98.5 F (36.9 C) (Tympanic)   Resp 16   Wt 239 lb 1.6 oz (108.5 kg)   BMI 30.70 kg/m  Well-developed well-nourished patient in NAD. HEENT reveals PERLA, EOMI, discs not visualized.  Oral cavity is clear. No oral mucosal lesions are identified. Neck is clear without evidence of cervical or supraclavicular adenopathy. Lungs are clear to A&P. Cardiac examination is essentially unremarkable with regular rate and rhythm without murmur rub or thrill. Abdomen is benign with no organomegaly or masses noted. Motor sensory and DTR levels are equal and symmetric in the upper and lower extremities. Cranial nerves II through XII are grossly intact. Proprioception is intact. No peripheral adenopathy or edema is identified. No motor or sensory levels are noted. Crude visual fields are within normal range.  RADIOLOGY RESULTS: No current films to review  PLAN: Present time patient is under excellent biochemical control of his prostate cancer.  Of asked to see him back in 1 year for follow-up with a PSA at  that time.  Patient knows to call with any concerns.  Does have slight uptake in his PSA although fairly inconsequential at that level.  I would like to take this opportunity to thank you for allowing me to participate in the care of your patient.Noreene Filbert, MD

## 2021-02-23 ENCOUNTER — Other Ambulatory Visit: Payer: Self-pay

## 2021-02-23 ENCOUNTER — Ambulatory Visit (INDEPENDENT_AMBULATORY_CARE_PROVIDER_SITE_OTHER): Payer: Medicare Other | Admitting: Physician Assistant

## 2021-02-23 ENCOUNTER — Ambulatory Visit (HOSPITAL_COMMUNITY)
Admission: RE | Admit: 2021-02-23 | Discharge: 2021-02-23 | Disposition: A | Discharge status: Home / Self Care | Payer: Medicare Other | Source: Ambulatory Visit | Attending: Vascular Surgery | Admitting: Vascular Surgery

## 2021-02-23 VITALS — BP 137/84 | HR 62 | Temp 98.3°F | Resp 20 | Ht 74.0 in | Wt 237.6 lb

## 2021-02-23 DIAGNOSIS — Z8679 Personal history of other diseases of the circulatory system: Secondary | ICD-10-CM

## 2021-02-23 DIAGNOSIS — I6523 Occlusion and stenosis of bilateral carotid arteries: Secondary | ICD-10-CM

## 2021-02-23 NOTE — Progress Notes (Signed)
Office Note     CC:  follow up Requesting Provider:  Juluis Pitch, MD  HPI: John [name] is a 80 y.o. 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  This was manifested by drooping of his right eyelid and diagnosed with Horner's syndrome.  He was treated with six months of Plavix then transitioned to aspirin 81mg  daily.  He relocated to this area and Dr. Trula [name] assumed follow-up care in 2018.  He denies monocular blindness, facial drooping, slurred speech or extremity weakness or numbness.  There is no past hx of CVA or TIA.  He has a seizure disorder.  Besides aspirin, he is maintained on a BB.  He is not diabetic.  He has never smoked.  Previous duplex on 02/24/2020 revealed approx. 1-39% bilateral ICA stenoses.   Past Medical History:  Diagnosis Date  . Cancer ([employer])   . Depression   . Depression   . Dissection of carotid artery ([place]) 08/31/2015  . Eczema   . Elevated PSA   . H/O Osgood-Schlatter disease   . Hand deformity, congenital   . Hearing loss bilateral  . Hemorrhoids   . Horner syndrome   . Hypertension   . Migraine   . Obesity   . Prostate cancer ([place]) 2020  . Rosacea   . Sleep apnea     Past Surgical History:  Procedure Laterality Date  . ANKLE ARTHROSCOPY    . CHOLECYSTECTOMY    . RETINAL DETACHMENT SURGERY    . SUBDURAL HEMATOMA EVACUATION VIA CRANIOTOMY    . THUMB FUSION    . TREATMENT FISTULA ANAL      Social History   Socioeconomic History  . Marital status: Married    Spouse name: Not on file  . Number of children: Not on file  . Years of education: Not on file  . Highest education level: Not on file  Occupational History  . Not on file  Tobacco Use  . Smoking status: Never Smoker  . Smokeless tobacco: Never Used  Vaping Use  . Vaping Use: Never used  Substance and Sexual Activity  . Alcohol use: Yes    Alcohol/week: 0.0 standard drinks    Comment: occ  . Drug use: No  .  Sexual activity: Yes    Birth control/protection: None  Other Topics Concern  . Not on file  Social History Narrative  . Not on file   Social Determinants of Health   Financial Resource Strain: Not on file  Food Insecurity: Not on file  Transportation Needs: Not on file  Physical Activity: Not on file  Stress: Not on file  Social Connections: Not on file  Intimate Partner Violence: Not on file   Family History  Problem Relation Age of Onset  . Osteoporosis Mother   . Depression Mother   . Prostate cancer Father   . Hypertension Father   . Kidney disease Neg Hx     Current Outpatient Medications  Medication Sig Dispense Refill  . aspirin EC 81 MG tablet Take by mouth.    Marland Kitchen atenolol (TENORMIN) 50 MG tablet Take 50 mg by mouth daily.  0  . levETIRAcetam (KEPPRA) 500 MG tablet Take by mouth.    . SUMAtriptan (IMITREX) 100 MG tablet TAKE 1 TABLET BY MOUTH ONCE AS NEEDED FOR MIGRAINE. MAY REPEAT 2ND DOSE AFTER 2 HOUR IF NEEDED    . tamsulosin (FLOMAX) 0.4 MG CAPS capsule TAKE 1 CAPSULE (0.4 MG TOTAL) BY MOUTH  DAILY AFTER SUPPER. 90 capsule 3  . venlafaxine (EFFEXOR) 37.5 MG tablet Take 37.5 mg by mouth daily.    . rizatriptan (MAXALT) 10 MG tablet  (Patient not taking: Reported on 02/23/2021)     Current Facility-Administered Medications  Medication Dose Route Frequency Provider Last Rate Last Admin  . gentamicin (GARAMYCIN) injection 80 mg  80 mg Intramuscular Once John Espy, MD      . levofloxacin [facility]) tablet 500 mg  500 mg Oral Once John Espy, MD        No Known Allergies   REVIEW OF SYSTEMS:   [X]  denotes positive finding, [ ]  denotes negative finding Cardiac  Comments:  Chest pain or chest pressure:    Shortness of breath upon exertion:    Short of breath when lying flat:    Irregular heart rhythm:        Vascular    Pain in calf, thigh, or hip brought on by ambulation:    Pain in feet at night that wakes you up from your sleep:     Blood clot  in your veins:    Leg swelling:         Pulmonary    Oxygen at home:    Productive cough:     Wheezing:         Neurologic    Sudden weakness in arms or legs:     Sudden numbness in arms or legs:     Sudden onset of difficulty speaking or slurred speech:    Temporary loss of vision in one eye:     Problems with dizziness:         Gastrointestinal    Blood in stool:     Vomited blood:         Genitourinary    Burning when urinating:     Blood in urine:        Psychiatric    Major depression:         Hematologic    Bleeding problems:    Problems with blood clotting too easily:        Skin    Rashes or ulcers:        Constitutional    Fever or chills:      PHYSICAL EXAMINATION:  Vitals:   02/23/21 1133  Weight: 237 lb 9.6 oz (107.8 kg)  Height: 6\' 2"  (1.88 m)    General:  WDWN in NAD; vital signs documented above Gait: unaided, no ataxia HENT: WNL, normocephalic Pulmonary: normal non-labored breathing , without Rales, rhonchi,  wheezing Cardiac: regular HR, without  Murmurs without carotid bruits Abdomen: soft, NT, no masses Skin: without rashes Vascular Exam/Pulses: 2+ radial and brachial pulses Extremities: without ischemic changes, without Gangrene , without cellulitis; without open wounds;  Musculoskeletal: no muscle wasting or atrophy  Neurologic: A&O X 3;  No focal weakness or paresthesias are detected Psychiatric:  The pt has Normal affect.   Non-Invasive Vascular Imaging:   02/23/2021 Right Carotid: Velocities in the right ICA are consistent with a 1-39% stenosis.   Left Carotid: Velocities in the left ICA are consistent with a 1-39%  stenosis. The ECA appears <50% stenosed.   Vertebrals: Bilateral vertebral arteries demonstrate antegrade flow.  Subclavians: Normal flow hemodynamics were seen in bilateral subclavian arteries.    ASSESSMENT/PLAN:: 80 y.o. male here for follow up for right carotid artery dissection and mild carotid artery  stenosis.  He is asymptomatic.  Carotid duplex is unchanged as compared to May  2019.  He is compliant with his aspirin therapy.  We reviewed signs and symptoms of stroke and TIAs.  We recommend annual follow-up with carotid duplex.  John Banner, PA-C [facility] [phone]  Clinic MD:   Dr. Trula [name] on call

## 2021-05-25 ENCOUNTER — Telehealth: Payer: Self-pay | Admitting: Licensed Clinical Social Worker

## 2021-05-25 NOTE — Telephone Encounter (Signed)
Patient called with symptoms of urinary frequency, incontinence, back pain, fever. He was concerned that it was due to the radiation treatment he had 2 years ago. He had an appointment today at 2 pm with his primary.

## 2021-05-30 ENCOUNTER — Ambulatory Visit: Payer: Medicare Other | Admitting: Urology

## 2021-06-05 ENCOUNTER — Ambulatory Visit (INDEPENDENT_AMBULATORY_CARE_PROVIDER_SITE_OTHER): Payer: Medicare Other | Admitting: Urology

## 2021-06-05 ENCOUNTER — Other Ambulatory Visit: Payer: Self-pay

## 2021-06-05 ENCOUNTER — Encounter: Payer: Self-pay | Admitting: Urology

## 2021-06-05 VITALS — BP 139/83 | HR 48 | Ht 74.0 in | Wt 237.0 lb

## 2021-06-05 DIAGNOSIS — I6523 Occlusion and stenosis of bilateral carotid arteries: Secondary | ICD-10-CM

## 2021-06-05 DIAGNOSIS — R300 Dysuria: Secondary | ICD-10-CM

## 2021-06-05 DIAGNOSIS — R8290 Unspecified abnormal findings in urine: Secondary | ICD-10-CM | POA: Diagnosis not present

## 2021-06-05 DIAGNOSIS — R3915 Urgency of urination: Secondary | ICD-10-CM | POA: Diagnosis not present

## 2021-06-05 DIAGNOSIS — Z8546 Personal history of malignant neoplasm of prostate: Secondary | ICD-10-CM | POA: Diagnosis not present

## 2021-06-05 LAB — BLADDER SCAN AMB NON-IMAGING: Scan Result: 0

## 2021-06-05 NOTE — Progress Notes (Signed)
06/05/2021 2:16 PM   John [name] 08/12/1941 [id number]  Referring provider: Juluis Pitch, MD [phone] [place],  [place] [address]  Chief Complaint  Patient presents with   Dysuria    HPI: 80 year old male with a personal history of prostate cancer who presents today for further evaluation of dysuria.  He has a personal history of low risk prostate cancer as well as rising PSA up to 17.7.  Ultimately, he elected to undergo treatment with IMRT (completed 2/20) as well as 6 months of Lupron.    PSA 0.04 on 10/2020.  Urinalysis on 05/25/2021 unremarkable.  He reports that several weeks ago, he started experiencing urinary urgency frequency and had an episode of urge incontinence.  Around that same time, he was traveling to [place].  On 1 evening, he developed a fever to 102, had what sounds like delirium and also sedated with low back pain.  The end of cutting there trip short and returned home.  When they returned home, with he and his wife tested positive for COVID.  They had been vaccinated x4 with her most recent booster just prior to their departure further trip.  When he returned, he was seen and evaluated by the nurse practitioner as outlined above at which time his urinalysis was unremarkable.  He he reports today that his symptoms seem to have all returned back to baseline.  He is no longer having urgency frequency he is feeling overall much better.  He does report that his urine is very dark.  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  He became very dehydrated and had to sit on his report for about an hour and a half.  Since then, his urine is been quite dark.  Urinalysis today is unremarkable microscopically however the dip does show trace glucose, trace protein and nitrate positive.  He declines taking any supplementation or being on any medication such as Pyridium.   PMH: Past Medical History:  Diagnosis Date   Cancer ([place])    Depression    Depression     Dissection of carotid artery ([place]) 08/31/2015   Eczema    Elevated PSA    H/O Osgood-Schlatter disease    Hand deformity, congenital    Hearing loss bilateral   Hemorrhoids    Horner syndrome    Hypertension    Migraine    Obesity    Prostate cancer ([place]) 2020   Rosacea    Sleep apnea     Surgical History: Past Surgical History:  Procedure Laterality Date   ANKLE ARTHROSCOPY     CHOLECYSTECTOMY     RETINAL DETACHMENT SURGERY     SUBDURAL HEMATOMA EVACUATION VIA CRANIOTOMY     THUMB FUSION     TREATMENT FISTULA ANAL      Home Medications:  Allergies as of 06/05/2021   No Known Allergies      Medication List        Accurate as of June 05, 2021  2:16 PM. If you have any questions, ask your nurse or doctor.          STOP taking these medications    rizatriptan 10 MG tablet Commonly known as: MAXALT Stopped by: Hollice Espy, MD       TAKE these medications    aspirin EC 81 MG tablet Take by mouth.   atenolol 50 MG tablet Commonly known as: TENORMIN Take 50 mg by mouth daily.   benzonatate 100 MG capsule Commonly known as:  TESSALON Take 100 mg by mouth 3 (three) times daily as needed.   guaiFENesin-codeine 100-10 MG/5ML syrup Take 5 mLs by mouth every 6 (six) hours as needed.   levETIRAcetam 500 MG tablet Commonly known as: KEPPRA Take by mouth.   SUMAtriptan 100 MG tablet Commonly known as: IMITREX TAKE 1 TABLET BY MOUTH ONCE AS NEEDED FOR MIGRAINE. MAY REPEAT 2ND DOSE AFTER 2 HOUR IF NEEDED   tamsulosin 0.4 MG Caps capsule Commonly known as: FLOMAX TAKE 1 CAPSULE (0.4 MG TOTAL) BY MOUTH DAILY AFTER SUPPER.   venlafaxine 37.5 MG tablet Commonly known as: EFFEXOR Take 37.5 mg by mouth daily.        Allergies: No Known Allergies  Family History: Family History  Problem Relation Age of Onset   Osteoporosis Mother    Depression Mother    Prostate cancer Father    Hypertension Father    Kidney disease Neg Hx     Social  History:  reports that he has never smoked. He has never used smokeless tobacco. He reports current alcohol use. He reports that he does not use drugs.   Physical Exam: BP 139/83   Pulse (!) 48   Ht '6\' 2"'$  (1.88 m)   Wt 237 lb (107.5 kg)   BMI 30.43 kg/m   Constitutional:  Alert and oriented, No acute distress. HEENT: [place] AT, moist mucus membranes.  Trachea midline, no masses. Cardiovascular: No clubbing, cyanosis, or edema. Respiratory: Normal respiratory effort, no increased work of breathing. Skin: No rashes, bruises or suspicious lesions. Neurologic: Grossly intact, no focal deficits, moving all 4 extremities. Psychiatric: Normal mood and affect.   Assessment & Plan:    1. History of prostate cancer NED, PSA remains very low near undetectable levels  Continue to monitor every 6 months, seeing Dr. Baruch Gouty - Urinalysis, Complete - Bladder Scan (Post Void Residual) in office - CULTURE, URINE COMPREHENSIVE  2. Urinary urgency Continues on Flomax and urinary urgency frequency have resolved  Has had essentially unremarkable urinalysis x2 and symptoms are improving  At this point time, we will hold off on further intervention but reassess again in 3 months to ensure that his symptoms have completely resolved  3. Abnormal urinalysis Nitrite positive urine but no other signs or symptoms of infection at this point, because of this probably false positive  Will culture given the sensitivity and specificity for this to rule out infection although not suspected   Follow-up in 3 months with IPSS/PVR   Hollice Espy, MD  [facility] [address], [place], [place] [address] [phone]

## 2021-06-06 LAB — URINALYSIS, COMPLETE
Bilirubin, UA: NEGATIVE
Ketones, UA: NEGATIVE
Leukocytes,UA: NEGATIVE
Nitrite, UA: POSITIVE — AB
RBC, UA: NEGATIVE
Specific Gravity, UA: >1.03 — ABNORMAL HIGH (ref 1.005–1.030)
Urobilinogen, Ur: 0.2 mg/dL (ref 0.2–1.0)
pH, UA: 5 (ref 5.0–7.5)

## 2021-06-06 LAB — MICROSCOPIC EXAMINATION

## 2021-06-08 LAB — CULTURE, URINE COMPREHENSIVE

## 2021-06-26 ENCOUNTER — Other Ambulatory Visit: Payer: Self-pay | Admitting: Radiation Oncology

## 2021-07-05 ENCOUNTER — Encounter: Payer: Self-pay | Admitting: *Deleted

## 2021-09-05 NOTE — Progress Notes (Signed)
09/06/21 10:58 AM   John [name] 1941-01-06 [id number]  Referring provider:  Juluis Pitch, MD [phone] [place],  [place] [address] Chief Complaint  Patient presents with   Prostate Cancer     HPI: John [name] is a 80 y.o.male with a personal history of prostate cancer, abnormal urinalysis, and urinary urgency, who presents today for 3 month follow-up IPSS.   He has a personal history of low risk prostate cancer as well as rising PSA up to 17.7.  Ultimately, he elected to undergo treatment with IMRT (completed 2/20) as well as 6 months of Lupron.    His most recent PSA on 11/08/2020 was 0.04.   He reports that he is experiencing urgency, difficulty starting stream, and weak stream. He reports the is he urinates standing he has difficulty holding back his urine. He has difficulty urinating when he is sitting.  These symptoms of been waxing and waning, he believes it is secondary to IMRT treatment. He also has been unable to ejaculate, he is currently on Flomax. He has been seeing Dr. Baruch Gouty but he has not received PSA since January 2022.  Overall, symptoms are slightly improved albeit persistent from 3 months ago.    IPSS     Row Name 09/06/21 1000         International Prostate Symptom Score   How often have you had the sensation of not emptying your bladder? About half the time     How often have you had to urinate less than every two hours? Less than 1 in 5 times     How often have you found you stopped and started again several times when you urinated? Less than half the time     How often have you found it difficult to postpone urination? About half the time     How often have you had a weak urinary stream? About half the time     How often have you had to strain to start urination? Not at All     How many times did you typically get up at night to urinate? 2 Times     Total IPSS Score 14       Quality of Life due to urinary symptoms   If you were to spend the  rest of your life with your urinary condition just the way it is now how would you feel about that? Mostly Disatisfied              Score:  1-7 Mild 8-19 Moderate 20-35 Severe   PMH: Past Medical History:  Diagnosis Date   Cancer ([place])    Depression    Depression    Dissection of carotid artery ([place]) 08/31/2015   Eczema    Elevated PSA    H/O Osgood-Schlatter disease    Hand deformity, congenital    Hearing loss bilateral   Hemorrhoids    Horner syndrome    Hypertension    Migraine    Obesity    Prostate cancer ([place]) 2020   Rosacea    Sleep apnea     Surgical History: Past Surgical History:  Procedure Laterality Date   ANKLE ARTHROSCOPY     CHOLECYSTECTOMY     RETINAL DETACHMENT SURGERY     SUBDURAL HEMATOMA EVACUATION VIA CRANIOTOMY     THUMB FUSION     TREATMENT FISTULA ANAL      Home Medications:  Allergies as of 09/06/2021   No Known Allergies  Medication List        Accurate as of September 06, 2021 10:58 AM. If you have any questions, ask your nurse or doctor.          STOP taking these medications    benzonatate 100 MG capsule Commonly known as: TESSALON Stopped by: Hollice Espy, MD   guaiFENesin-codeine 100-10 MG/5ML syrup Stopped by: Hollice Espy, MD       TAKE these medications    aspirin EC 81 MG tablet Take by mouth.   atenolol 50 MG tablet Commonly known as: TENORMIN Take 50 mg by mouth daily.   levETIRAcetam 500 MG tablet Commonly known as: KEPPRA Take by mouth.   SUMAtriptan 100 MG tablet Commonly known as: IMITREX TAKE 1 TABLET BY MOUTH ONCE AS NEEDED FOR MIGRAINE. MAY REPEAT 2ND DOSE AFTER 2 HOUR IF NEEDED   tamsulosin 0.4 MG Caps capsule Commonly known as: FLOMAX TAKE 1 CAPSULE (0.4 MG TOTAL) BY MOUTH DAILY AFTER SUPPER.   venlafaxine 37.5 MG tablet Commonly known as: EFFEXOR Take 37.5 mg by mouth daily.        Allergies: No Known Allergies  Family History: Family History  Problem  Relation Age of Onset   Osteoporosis Mother    Depression Mother    Prostate cancer Father    Hypertension Father    Kidney disease Neg Hx     Social History:  reports that he has never smoked. He has never used smokeless tobacco. He reports current alcohol use. He reports that he does not use drugs.   Physical Exam: BP 120/70   Pulse (!) 54   Ht 6\' 2"  (1.88 m)   Wt 235 lb (106.6 kg)   BMI 30.17 kg/m   Constitutional:  Alert and oriented, No acute distress. HEENT: [place] AT, moist mucus membranes.  Trachea midline, no masses. Cardiovascular: No clubbing, cyanosis, or edema. Respiratory: Normal respiratory effort, no increased work of breathing. Skin: No rashes, bruises or suspicious lesions. Neurologic: Grossly intact, no focal deficits, moving all 4 extremities. Psychiatric: Normal mood and affect.  Laboratory Data:  Lab Results  Component Value Date   CREATININE 0.80 05/21/2017    Pertinent Imaging: Results for orders placed or performed in visit on 09/06/21  BLADDER SCAN AMB NON-IMAGING  Result Value Ref Range   Scan Result 39ml     Assessment & Plan:    History of prostate cancer  - NED, PSA remains very low near undetectable levels as of January 2022, he has no recent PSA.  - PSA; Pending  - Bladder Scan (Post Void Residual) in office  2. Urinary urgency/ difficulty starting stream  - Continue Flomax - He is voiding adequately, PVR minimal - Cystoscopy scheduled to review anatomy of the bladder /prostate and rule out underlying conditions  3. Anejaculation  - Discussed how this is likely secondary to Flomax versus radiation.    Return for cysto.  I,Kailey Littlejohn,acting as a Education administrator for Hollice Espy, MD.,have documented all relevant documentation on the behalf of Hollice Espy, MD,as directed by  Hollice Espy, MD while in the presence of Hollice Espy, MD.  I have reviewed the above documentation for accuracy and completeness, and I agree with the  above.   Hollice Espy, MD   [facility] [address], [place], [place] [address] [phone]

## 2021-09-06 ENCOUNTER — Ambulatory Visit (INDEPENDENT_AMBULATORY_CARE_PROVIDER_SITE_OTHER): Payer: Medicare Other | Admitting: Urology

## 2021-09-06 ENCOUNTER — Other Ambulatory Visit: Payer: Self-pay

## 2021-09-06 VITALS — BP 120/70 | HR 54 | Ht 74.0 in | Wt 235.0 lb

## 2021-09-06 DIAGNOSIS — R3915 Urgency of urination: Secondary | ICD-10-CM

## 2021-09-06 DIAGNOSIS — C61 Malignant neoplasm of prostate: Secondary | ICD-10-CM | POA: Diagnosis not present

## 2021-09-06 DIAGNOSIS — I6523 Occlusion and stenosis of bilateral carotid arteries: Secondary | ICD-10-CM

## 2021-09-06 LAB — BLADDER SCAN AMB NON-IMAGING

## 2021-09-06 NOTE — Patient Instructions (Signed)

## 2021-09-07 LAB — PSA: Prostate Specific Ag, Serum: <0.1 ng/mL (ref 0.0–4.0)

## 2021-09-10 ENCOUNTER — Telehealth: Payer: Self-pay | Admitting: *Deleted

## 2021-09-10 NOTE — Telephone Encounter (Signed)
-----   Message from Hollice Espy, MD sent at 09/10/2021  9:40 AM EST ----- PSA remains undetectable, great news  Hollice Espy, MD

## 2021-09-10 NOTE — Telephone Encounter (Signed)
Notified patient as instructed, patient pleased °

## 2021-09-12 ENCOUNTER — Ambulatory Visit: Payer: Self-pay | Admitting: Urology

## 2021-09-19 ENCOUNTER — Other Ambulatory Visit: Payer: Medicare Other | Admitting: Urology

## 2021-09-30 NOTE — Progress Notes (Signed)
   10/02/2021 CC:  Chief Complaint  Patient presents with   Cysto     HPI: John [name] is a 80 y.o. male with a personal history of prostate cancer, abnormal urinalysis, anejaculation, and urinary urgency, who presents today for cystoscopy.   He has a personal history of low risk prostate cancer as well as rising PSA up to 17.7.  Ultimately, he elected to undergo treatment with IMRT (completed 2/20) as well as 6 months of Lupron.     His most recent PSA on 11/08/2020 was 0.04.   He reports today that when he stands to urinate he is able to have better stream then when he sits.    Vitals:   10/02/21 1351  BP: 136/84  Pulse: 88  NED. A&Ox3.   No respiratory distress   Abd soft, NT, ND Normal phallus with bilateral descended testicles  Cystoscopy Procedure Note  Patient identification was confirmed, informed consent was obtained, and patient was prepped using Betadine solution.  Lidocaine jelly was administered per urethral meatus.     Pre-Procedure: - Inspection reveals a normal caliber ureteral meatus.  Procedure: The flexible cystoscope was introduced without difficulty - No urethral strictures/lesions are present. - Enlarged prostate with trilobar coaptation. Friable prostatic mucosa  -Elevated bladder neck - Bilateral ureteral orifices identified - Bladder mucosa  reveals no ulcers, tumors, or lesions - No bladder stones - Minimal trabeculation  Retroflexion shows well circumscribed median lobe  Post-Procedure: - Patient tolerated the procedure well  Assessment/ Plan:  Prostate cancer  - NED  - Continue to monitor PSA  - Return in 6 months for PSA  2. BPH with urinary frequency/urgency  - We discussed various the option of procedures including TURP/laser nucleation of the prostate versus optimization of medications versus urodynamics.  He is not interested in anything at this time and thinks his symptoms are fairly stable.  - Continue flomax   Return in  6 months for PSA / IPSS/ PVR or sooner if symptoms worsen  I,Kailey Littlejohn,acting as a scribe for Hollice Espy, MD.,have documented all relevant documentation on the behalf of Hollice Espy, MD,as directed by  Hollice Espy, MD while in the presence of Hollice Espy, MD.  I have reviewed the above documentation for accuracy and completeness, and I agree with the above.   Hollice Espy, MD

## 2021-10-02 ENCOUNTER — Encounter: Payer: Self-pay | Admitting: Urology

## 2021-10-02 ENCOUNTER — Other Ambulatory Visit: Payer: Self-pay

## 2021-10-02 ENCOUNTER — Ambulatory Visit (INDEPENDENT_AMBULATORY_CARE_PROVIDER_SITE_OTHER): Payer: Medicare Other | Admitting: Urology

## 2021-10-02 VITALS — BP 136/84 | HR 88 | Ht 74.0 in | Wt 235.0 lb

## 2021-10-02 DIAGNOSIS — R3915 Urgency of urination: Secondary | ICD-10-CM

## 2021-10-02 LAB — MICROSCOPIC EXAMINATION
Bacteria, UA: NONE SEEN
Epithelial Cells (non renal): NONE SEEN /hpf (ref 0–10)
RBC, Urine: NONE SEEN /hpf (ref 0–2)

## 2021-10-02 LAB — URINALYSIS, COMPLETE
Bilirubin, UA: NEGATIVE
Leukocytes,UA: NEGATIVE
Nitrite, UA: NEGATIVE
Protein,UA: NEGATIVE
RBC, UA: NEGATIVE
Specific Gravity, UA: 1.02 (ref 1.005–1.030)
Urobilinogen, Ur: 0.2 mg/dL (ref 0.2–1.0)
pH, UA: 5.5 (ref 5.0–7.5)

## 2021-11-08 ENCOUNTER — Inpatient Hospital Stay: Payer: Medicare Other

## 2021-11-09 ENCOUNTER — Other Ambulatory Visit: Payer: Self-pay

## 2021-11-09 ENCOUNTER — Emergency Department: Payer: Medicare Other

## 2021-11-09 ENCOUNTER — Emergency Department
Admission: EM | Admit: 2021-11-09 | Discharge: 2021-11-09 | Disposition: A | Discharge status: Home / Self Care | Payer: Medicare Other | Attending: Emergency Medicine | Admitting: Emergency Medicine

## 2021-11-09 DIAGNOSIS — W1830XA Fall on same level, unspecified, initial encounter: Secondary | ICD-10-CM | POA: Diagnosis not present

## 2021-11-09 DIAGNOSIS — R81 Glycosuria: Secondary | ICD-10-CM | POA: Diagnosis not present

## 2021-11-09 DIAGNOSIS — R739 Hyperglycemia, unspecified: Secondary | ICD-10-CM

## 2021-11-09 DIAGNOSIS — S299XXA Unspecified injury of thorax, initial encounter: Secondary | ICD-10-CM | POA: Insufficient documentation

## 2021-11-09 DIAGNOSIS — Y9301 Activity, walking, marching and hiking: Secondary | ICD-10-CM | POA: Insufficient documentation

## 2021-11-09 LAB — URINALYSIS, ROUTINE W REFLEX MICROSCOPIC
Bacteria, UA: NONE SEEN
Bilirubin Urine: NEGATIVE
Glucose, UA: >=500 mg/dL — AB
Hgb urine dipstick: NEGATIVE
Ketones, ur: 5 mg/dL — AB
Leukocytes,Ua: NEGATIVE
Nitrite: NEGATIVE
Protein, ur: NEGATIVE mg/dL
Specific Gravity, Urine: 1.028 (ref 1.005–1.030)
pH: 5 (ref 5.0–8.0)

## 2021-11-09 LAB — CBG MONITORING, ED: Glucose-Capillary: 318 mg/dL — ABNORMAL HIGH (ref 70–99)

## 2021-11-09 NOTE — ED Notes (Addendum)
CBG 318. Provider informed.

## 2021-11-09 NOTE — ED Provider Triage Note (Signed)
Emergency Medicine Provider Triage Evaluation Note  John [name] , a 81 y.o. male  was evaluated in triage.  Pt complains of right rib pain that radiates into the right abdomen. He fell on a platform a week ago. Pain has gradually worsened since. Tylenol and Ibuprofen effective until last night. Pain feels like a strong cramp.  Review of Systems  Positive: Right side pain Negative: Shortness of breath  Physical Exam  BP 137/65 (BP Location: Left Arm)    Pulse (!) 53    Temp 98 F (36.7 C) (Oral)    Resp 16    Ht 6\' 2"  (1.88 m)    Wt 108 kg    SpO2 96%    BMI 30.56 kg/m  Gen:   Awake, no distress   Resp:  Normal effort  MSK:   Moves extremities without difficulty Other:    Medical Decision Making  Medically screening exam initiated at 2:38 PM.  Appropriate orders placed.  Frazier Balfour was informed that the remainder of the evaluation will be completed by another provider, this initial triage assessment does not replace that evaluation, and the importance of remaining in the ED until their evaluation is complete.    Victorino Dike, FNP 11/09/21 1440

## 2021-11-09 NOTE — ED Triage Notes (Signed)
Pt states that he fell a week ago today landing on his right side hitting a plastic platform, pt states that tylenol and ibuprofen have helped, pt states last pm his pain changed and became more like spasms and points to is right rib area and is tender to palpate. Pt is tender with right upper abd area as well

## 2021-11-09 NOTE — ED Provider Notes (Signed)
[facility] Provider Note    Event Date/Time   First MD Initiated Contact with Patient 11/09/21 1705     (approximate)   History   Fall and Chest Pain (Right rib pain)   HPI  John [name] is a 81 y.o. male presents to the emergency department for treatment and evaluation of right side rib pain. He fell over a platform while walking backward and landed on a second platform that was behind it. Initially, he only had some right shoulder pain but since has developed right side rib pain. He does get relief with tylenol and ibuprofen, but want to make sure there is nothing broken or more serious. Pain increases with movement.        Physical Exam   Triage Vital Signs: ED Triage Vitals  Enc Vitals Group     BP 11/09/21 1430 137/65     Pulse Rate 11/09/21 1430 (!) 53     Resp 11/09/21 1430 16     Temp 11/09/21 1430 98 F (36.7 C)     Temp Source 11/09/21 1430 Oral     SpO2 11/09/21 1430 96 %     Weight 11/09/21 1437 238 lb (108 kg)     Height 11/09/21 1437 6\' 2"  (1.88 m)     Head Circumference --      Peak Flow --      Pain Score 11/09/21 1437 2     Pain Loc --      Pain Edu? --      Excl. in [place]? --     Most recent vital signs: Vitals:   11/09/21 1430 11/09/21 1841  BP: 137/65 (!) 147/94  Pulse: (!) 53 (!) 56  Resp: 16 16  Temp: 98 F (36.7 C)   SpO2: 96% 97%   General: Awake, no distress.  CV:  Good peripheral perfusion.  Resp:  Normal effort.  Abd:  No distention.  Other:  Focal tenderness over the lower right ribs.   ED Results / Procedures / Treatments   Labs (all labs ordered are listed, but only abnormal results are displayed) Labs Reviewed  URINALYSIS, ROUTINE W REFLEX MICROSCOPIC - Abnormal; Notable for the following components:      Result Value   Color, Urine YELLOW (*)    APPearance CLEAR (*)    Glucose, UA >=500 (*)    Ketones, ur 5 (*)    All other components within normal limits  CBG MONITORING, ED - Abnormal;  Notable for the following components:   Glucose-Capillary 318 (*)    All other components within normal limits     EKG  Not indicated.   RADIOLOGY No acute findings on image of the chest and right side ribs on x-ray. Radiology report confirms the same.   PROCEDURES:  Critical Care performed: No  Procedures   MEDICATIONS ORDERED IN ED: Medications - No data to display   IMPRESSION / MDM / [place] / ED COURSE  I reviewed the triage vital signs and the nursing notes.   Differential diagnosis includes, but is not limited to, rib fracture, rib contusion.  81 year old male presenting to the emergency department for treatment and evaluation of right lower rib pain after a mechanical, nonsyncopal fall 1 week ago.  See HPI for further details.  While awaiting ER room assignment, the patient had a x-ray of his chest and right ribs.  Radiology reading is negative.  I viewed the images well and did not identify any  fracture.  Exam is reassuring.  He is tender to touch over the right lateral lower rib area.  Pain increases with movement.  Pain does not increase with deep breath.  He does state that at times the pain will radiate toward the right flank area.  He reports history of urinary tract infection and possible kidney stone.  Will check urine although unlikely to be the root cause of his symptoms today.  Urinalysis does not show any indication of hematuria or cystitis.  There is greater than 500 glucose. No history of diabetes.  He is unaware if he has ever had glucosuria in the past.  Plan will be to check a CBG.  CBG elevated to 319. Patient reports having had a significant number of carbohydrates today. He is asymptomatic. Plan will be to have him start a low carbohydrate diet and follow up with primary care for A1c and repeat labs and urinalysis. Patient is agreeable to this plan.  For the rib pain, he is to continue his regimen of Tylenol and ibuprofen which has been  working.  He states that he only takes these medications about once per day, which is certainly reasonable.  For symptoms that change or worsen or for new concerns he is to see primary care or return to the emergency department.      FINAL CLINICAL IMPRESSION(S) / ED DIAGNOSES   Final diagnoses:  Hyperglycemia  Glucosuria  Traumatic injury of rib     Rx / DC Orders   ED Discharge Orders     None        Note:  This document was prepared using Dragon voice recognition software and may include unintentional dictation errors.   Victorino Dike, FNP 11/09/21 1844    Vanessa Huguley, MD 11/10/21 1901

## 2021-11-09 NOTE — Discharge Instructions (Signed)
Please call and schedule an appointment with your primary care doctor.  In the meantime, start a low carbohydrate diet. See enclosed recommendations. Return to the ER for symptoms of concern if unable to schedule an appointment.

## 2021-11-15 ENCOUNTER — Ambulatory Visit: Payer: Medicare Other | Admitting: Radiation Oncology

## 2021-11-19 ENCOUNTER — Ambulatory Visit: Payer: Medicare Other | Admitting: Radiation Oncology

## 2021-12-03 ENCOUNTER — Ambulatory Visit
Admission: RE | Admit: 2021-12-03 | Discharge: 2021-12-03 | Disposition: A | Discharge status: Home / Self Care | Payer: Medicare Other | Source: Ambulatory Visit | Attending: Radiation Oncology | Admitting: Radiation Oncology

## 2021-12-03 ENCOUNTER — Encounter: Payer: Self-pay | Admitting: Radiation Oncology

## 2021-12-03 ENCOUNTER — Other Ambulatory Visit: Payer: Self-pay

## 2021-12-03 DIAGNOSIS — Z923 Personal history of irradiation: Secondary | ICD-10-CM | POA: Diagnosis not present

## 2021-12-03 DIAGNOSIS — C61 Malignant neoplasm of prostate: Secondary | ICD-10-CM | POA: Insufficient documentation

## 2021-12-03 NOTE — Progress Notes (Signed)
Radiation Oncology Follow up Note  Name: John [name]   Date:   12/03/2021 MRN:  [id number] DOB: 1941/06/05    This 81 y.o. 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  Seen today in routine follow-up he is doing well he continues to have some slight urgency frequency and some dribbling nocturia x2-3.Marland Kitchen  He had a recent cystoscopy by Dr. Erlene Quan showing very little pathology.  Except for BPH he is currently on Flomax.  His most recent PSA remains undetectable at less than 0.1  COMPLICATIONS OF TREATMENT: none  FOLLOW UP COMPLIANCE: keeps appointments   PHYSICAL EXAM:  BP (P) 139/71 (BP Location: Left Arm, Patient Position: Sitting)    Pulse (P) 61    Temp (!) (P) 95.2 F (35.1 C) (Tympanic)    Resp (P) 18    Wt (P) 224 lb 14.4 oz (102 kg)    BMI (P) 28.88 kg/m  Well-developed well-nourished patient in NAD. HEENT reveals PERLA, EOMI, discs not visualized.  Oral cavity is clear. No oral mucosal lesions are identified. Neck is clear without evidence of cervical or supraclavicular adenopathy. Lungs are clear to A&P. Cardiac examination is essentially unremarkable with regular rate and rhythm without murmur rub or thrill. Abdomen is benign with no organomegaly or masses noted. Motor sensory and DTR levels are equal and symmetric in the upper and lower extremities. Cranial nerves II through XII are grossly intact. Proprioception is intact. No peripheral adenopathy or edema is identified. No motor or sensory levels are noted. Crude visual fields are within normal range.  RADIOLOGY RESULTS: No current films to review  PLAN: Present time patient is doing well with no evidence of  disease.  On pleased with his overall progress.  He is now over 3 years I am going to turn follow-up care over to urology.  Would be happy to reevaluate the patient anytime should further consultation be indicated.  Patient knows to call with any concerns.  I would like to take this opportunity to thank you for allowing me to participate in the care of your patient.Noreene Filbert, MD

## 2021-12-05 ENCOUNTER — Ambulatory Visit: Payer: Self-pay | Admitting: General Surgery
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
## 2021-12-19 ENCOUNTER — Ambulatory Visit: Payer: Medicare Other | Admitting: *Deleted

## 2021-12-21 ENCOUNTER — Other Ambulatory Visit: Payer: Self-pay

## 2021-12-21 ENCOUNTER — Encounter: Payer: Self-pay | Admitting: *Deleted

## 2021-12-21 ENCOUNTER — Encounter: Payer: Medicare Other | Attending: Family Medicine | Admitting: *Deleted

## 2021-12-21 VITALS — BP 128/80 | Ht 74.0 in | Wt 224.7 lb

## 2021-12-21 DIAGNOSIS — E1165 Type 2 diabetes mellitus with hyperglycemia: Secondary | ICD-10-CM

## 2021-12-21 DIAGNOSIS — E119 Type 2 diabetes mellitus without complications: Secondary | ICD-10-CM | POA: Diagnosis not present

## 2021-12-21 DIAGNOSIS — Z713 Dietary counseling and surveillance: Secondary | ICD-10-CM | POA: Diagnosis not present

## 2021-12-21 NOTE — Progress Notes (Signed)
Diabetes Self-Management Education  Visit Type: First/Initial  Appt. Start Time: 0850 Appt. End Time: 1010  12/21/2021  Mr. John [name], identified by name and date of birth, is a 81 y.o. male with a diagnosis of Diabetes: Type 2.   ASSESSMENT  Blood pressure 128/80, height 6\' 2"  (1.88 m), weight 224 lb 11.2 oz (101.9 kg). Body mass index is 28.85 kg/m.   Diabetes Self-Management Education - 12/21/21 1523       Visit Information   Visit Type First/Initial      Initial Visit   Diabetes Type Type 2    Are you currently following a meal plan? No    Are you taking your medications as prescribed? No   He has not started taking Glipizide. Reported he wanted to start checking his blood sugar first.   Date Diagnosed 2 weeks ago      Health Coping   How would you rate your overall health? Excellent      Psychosocial Assessment   Patient Belief/Attitude about Diabetes Other (comment)   "unsettled"   Self-care barriers None    Self-management support Doctor's office;Family    Other persons present Spouse/SO    Patient Concerns Nutrition/Meal planning;Glycemic Control;Weight Control;Monitoring;Healthy Lifestyle    Special Needs None    Preferred Learning Style Auditory;Visual    Learning Readiness Ready    How often do you need to have someone help you when you read instructions, pamphlets, or other written materials from your doctor or pharmacy? 1 - Never    What is the last grade level you completed in school? Masters      Pre-Education Assessment   Patient understands the diabetes disease and treatment process. Needs Instruction    Patient understands incorporating nutritional management into lifestyle. Needs Instruction    Patient undertands incorporating physical activity into lifestyle. Needs Instruction    Patient understands using medications safely. Needs Instruction    Patient understands monitoring blood glucose, interpreting and using results Needs Instruction     Patient understands prevention, detection, and treatment of acute complications. Needs Instruction    Patient understands prevention, detection, and treatment of chronic complications. Needs Instruction    Patient understands how to develop strategies to address psychosocial issues. Needs Instruction    Patient understands how to develop strategies to promote health/change behavior. Needs Instruction      Complications   Last HgB A1C per patient/outside source 10.8 %   11/27/2021   How often do you check your blood sugar? 0 times/day (not testing)   Provided Accu-Chek Guide Me meter and instructed on use. BG upon return demonstration was 224 mg/dL at 9:55 am - 2 hrs pp.   Have you had a dilated eye exam in the past 12 months? Yes    Have you had a dental exam in the past 12 months? Yes    Are you checking your feet? No      Dietary Intake   Breakfast smoothie with banana, milk, honey, berries, peanut butter, flaxseed; cereal and milk with blueberries, strawberries    Lunch skips or eats bologna sandwich with lettuce, tomato and onions    Dinner pork, fish; green beans, peas, corn, carrots, broccoli, sometimes rice; salads with lettuce, tomatoes, cuccumbers, peppers, cheese    Snack (evening) fruit (madarin oranges), cheese and crackers, bread, salad with tomatoes    Beverage(s) water, seltzer water      Exercise   Exercise Type ADL's      Patient Education   Previous  Diabetes Education No    Disease state  Definition of diabetes, type 1 and 2, and the diagnosis of diabetes;Factors that contribute to the development of diabetes    Nutrition management  Role of diet in the treatment of diabetes and the relationship between the three main macronutrients and blood glucose level;Food label reading, portion sizes and measuring food.;Reviewed blood glucose goals for pre and post meals and how to evaluate the patients' food intake on their blood glucose level.;Meal timing in regards to the patients'  current diabetes medication.    Physical activity and exercise  Role of exercise on diabetes management, blood pressure control and cardiac health.    Medications Reviewed patients medication for diabetes, action, purpose, timing of dose and side effects.;Other (comment)   Encouraged him to start taking Glipizide per MD orders.   Monitoring Taught/evaluated SMBG meter.;Purpose and frequency of SMBG.;Taught/discussed recording of test results and interpretation of SMBG.;Identified appropriate SMBG and/or A1C goals.    Chronic complications Relationship between chronic complications and blood glucose control    Psychosocial adjustment Identified and addressed patients feelings and concerns about diabetes      Individualized Goals (developed by patient)   Reducing Risk Other (comment)   improve blood sugars, prevent diabetes complications, lose weight, lead a healthier lifestyle, become more fit     Outcomes   Expected Outcomes Demonstrated interest in learning. Expect positive outcomes    Future DMSE 4-6 wks        Individualized Plan for Diabetes Self-Management Training:   Learning Objective:  Patient will have a greater understanding of diabetes self-management. Patient education plan is to attend individual and/or group sessions per assessed needs and concerns.   Plan:   Patient Instructions  Check blood sugars 1 x day before breakfast or 2 hrs after one meal every day Bring blood sugar records to the next class  Call your doctor for a prescription for:  1. Meter strips (type) Accu Chek Guide  checking  1 times per day  2. Lancets (type) Accu-Chek Softclix checking  1 times per day  Exercise:  Begin walking  for    5-10  minutes   3  days a week and gradually increase to 30 minutes 5 x week  Eat 3 meals day,   1-2  snacks a day Space meals 4-6 hours apart Don't skip meals - eat at least 1 protein and 1 carbohydrate serving  Expected Outcomes:  Demonstrated interest in learning.  Expect positive outcomes  Education material provided:  General Meal Planning Guidelines Simple Meal Plan Meter = Accu-Guide Me Healthy Snack Choices (ADA) Carb-Mindful Smoothie Handout  If problems or questions, patient to contact team via:   Johny Drilling, RN, [place] [phone]  Future DSME appointment: 4-6 wks January 24, 2022 for Diabetes Class 1

## 2021-12-21 NOTE — Patient Instructions (Signed)
Check blood sugars 1 x day before breakfast or 2 hrs after one meal every day Bring blood sugar records to the next class  Call your doctor for a prescription for:  1. Meter strips (type) Accu Chek Guide  checking  1 times per day  2. Lancets (type) Accu-Chek Softclix checking  1 times per day  Exercise:  Begin walking  for    5-10  minutes   3  days a week and gradually increase to 30 minutes 5 x week  Eat 3 meals day,   1-2  snacks a day Space meals 4-6 hours apart Don't skip meals - eat at least 1 protein and 1 carbohydrate serving  Return for classes on:

## 2021-12-26 ENCOUNTER — Encounter
Admission: RE | Admit: 2021-12-26 | Discharge: 2021-12-26 | Disposition: A | Discharge status: Home / Self Care | Payer: Medicare Other | Source: Ambulatory Visit | Attending: General Surgery | Admitting: General Surgery

## 2021-12-26 ENCOUNTER — Other Ambulatory Visit: Payer: Self-pay

## 2021-12-26 HISTORY — DX: Other specified postprocedural states: R11.2

## 2021-12-26 HISTORY — DX: Other specified postprocedural states: Z98.890

## 2021-12-26 NOTE — Patient Instructions (Addendum)
Your procedure is scheduled on: Wednesday, March 8 ?Report to the Registration Desk on the 1st floor of the [facility]. ?To find out your arrival time, please call [phone] between 1PM - 3PM on: Tuesday, March 7 ? ?REMEMBER: ?Instructions that are not followed completely may result in serious medical risk, up to and including death; or upon the discretion of your surgeon and anesthesiologist your surgery may need to be rescheduled. ? ?Do not eat or drink after midnight the night before surgery.  ?No gum chewing, lozengers or hard candies. ? ?TAKE THESE MEDICATIONS THE MORNING OF SURGERY WITH A SIP OF WATER: ? ?Atenolol ?Venlafaxine (Effexor) ? ?One week prior to surgery: starting march 1 ?Stop aspirin and Anti-inflammatories (NSAIDS) such as Advil, Aleve, Ibuprofen, Motrin, Naproxen, Naprosyn and Aspirin based products such as Excedrin, Goodys Powder, BC Powder. ?Stop ANY OVER THE COUNTER supplements until after surgery. ?You may however, continue to take Tylenol if needed for pain up until the day of surgery. ? ?No Alcohol for 24 hours before or after surgery. ? ?No Smoking including e-cigarettes for 24 hours prior to surgery.  ?No chewable tobacco products for at least 6 hours prior to surgery.  ?No nicotine patches on the day of surgery. ? ?Do not use any "recreational" drugs for at least a week prior to your surgery.  ?Please be advised that the combination of cocaine and anesthesia may have negative outcomes, up to and including death. ?If you test positive for cocaine, your surgery will be cancelled. ? ?On the morning of surgery brush your teeth with toothpaste and water, you may rinse your mouth with mouthwash if you wish. ?Do not swallow any toothpaste or mouthwash. ? ?Use CHG Soap as directed on instruction sheet. ? ?Do not wear jewelry. ? ?Do not wear lotions, powders, or perfumes.  ? ?Do not shave body from the neck down 48 hours prior to surgery just in case you cut yourself which could leave a  site for infection.  ?Also, freshly shaved skin may become irritated if using the CHG soap. ? ?Contact lenses, hearing aids and dentures may not be worn into surgery. ? ?Do not bring valuables to the hospital. [facility] is not responsible for any missing/lost belongings or valuables.  ? ?Bring your C-PAP to the hospital with you in case you may have to spend the night.  ? ?Notify your doctor if there is any change in your medical condition (cold, fever, infection). ? ?Wear comfortable clothing (specific to your surgery type) to the hospital. ? ?After surgery, you can help prevent lung complications by doing breathing exercises.  ?Take deep breaths and cough every 1-2 hours. Your doctor may order a device called an Incentive Spirometer to help you take deep breaths. ?When coughing or sneezing, hold a pillow firmly against your incision with both hands. This is called ?splinting.? Doing this helps protect your incision. It also decreases belly discomfort. ? ?If you are being discharged the day of surgery, you will not be allowed to drive home. ?You will need a responsible adult (18 years or older) to drive you home and stay with you that night.  ? ?If you are taking public transportation, you will need to have a responsible adult (18 years or older) with you. ?Please confirm with your physician that it is acceptable to use public transportation.  ? ?Please call the [facility] Dept. at [phone] if you have any questions about these instructions. ? ?Surgery Visitation Policy: ? ?Patients undergoing  a surgery or procedure may have one family member or support person with them as long as that person is not COVID-19 positive or experiencing its symptoms.  ?That person may remain in the waiting area during the procedure and may rotate out with other people. ?

## 2021-12-27 ENCOUNTER — Encounter: Payer: Self-pay | Admitting: Urgent Care

## 2021-12-27 ENCOUNTER — Encounter
Admission: RE | Admit: 2021-12-27 | Discharge: 2021-12-27 | Disposition: A | Discharge status: Home / Self Care | Payer: Medicare Other | Source: Ambulatory Visit | Attending: General Surgery | Admitting: General Surgery

## 2021-12-27 DIAGNOSIS — I10 Essential (primary) hypertension: Secondary | ICD-10-CM | POA: Diagnosis not present

## 2021-12-27 DIAGNOSIS — Z01810 Encounter for preprocedural cardiovascular examination: Secondary | ICD-10-CM | POA: Diagnosis present

## 2021-12-27 DIAGNOSIS — E119 Type 2 diabetes mellitus without complications: Secondary | ICD-10-CM | POA: Insufficient documentation

## 2021-12-27 NOTE — Progress Notes (Addendum)
?  Perioperative Services: Pre-Admission/Anesthesia Testing ? ?Abnormal Lab Notification ?  ? ?Date: 12/27/21 ? ?Name: John [name] ?MRN:   [id number] ? ?Re: Abnormal labs noted during PAT appointment  ? ?Provider(s) Notified: Herbert Pun, MD ?Notification mode: Routed and/or faxed via [place] ?  ?ABNORMAL LAB VALUE(S): ? Ref Range & Units 11/27/2021  ?Hemoglobin A1C 4.2 - 5.6 % 10.8 High    ?Average Blood Glucose (Calc) mg/dL 263   ?Resulting Agency  [facility]  ?Narrative ?Normal Range:    4.2 - 5.6%  ?Increased Risk:  5.7 - 6.4%  ?Diabetes:        >= 6.5%  ?Glycemic Control for adults with diabetes:  <7%   ?Specimen Collected: 11/27/21 09:19 Last Resulted: 11/27/21 13:25  ?Received From: [facility]  Result Received: 11/30/21 13:34  ? ?Clinical Notes:  ?Patient with a newly diagnosed T2DM diagnosis. He is currently on oral sulfonylurea monotherapy (glipizide). Last Hgb A1c was 10.8% on 11/27/2021. In efforts to reduce his risk of developing SSI, or other potential perioperative complications, this communication is being sent in order to determine if patient is deemed to have adequate medical optimization, including preoperative glycemic control.  ? ?With that being said, the benefit of improving glycemic control must be weighed against the overall risk associated with delaying a necessary elective surgery for this patient.  ? ?This is a Community education officer; no formal response is required. ? ?Honor Loh, MSN, APRN, FNP-C, CEN ?[place]  ?Peri-operative Services Nurse Practitioner ?Phone: [phone] ?12/27/21 10:22 AM ? ?

## 2022-01-02 ENCOUNTER — Encounter
Admission: RE | Disposition: A | Discharge status: Home / Self Care | Payer: Self-pay | Source: Home / Self Care | Attending: General Surgery

## 2022-01-02 ENCOUNTER — Ambulatory Visit: Payer: Medicare Other | Admitting: Urgent Care

## 2022-01-02 ENCOUNTER — Other Ambulatory Visit: Payer: Self-pay

## 2022-01-02 ENCOUNTER — Encounter: Payer: Self-pay | Admitting: General Surgery

## 2022-01-02 ENCOUNTER — Ambulatory Visit
Admission: RE | Admit: 2022-01-02 | Discharge: 2022-01-02 | Disposition: A | Discharge status: Home / Self Care | Payer: Medicare Other | Attending: General Surgery | Admitting: General Surgery

## 2022-01-02 DIAGNOSIS — Z6828 Body mass index (BMI) 28.0-28.9, adult: Secondary | ICD-10-CM | POA: Diagnosis not present

## 2022-01-02 DIAGNOSIS — I10 Essential (primary) hypertension: Secondary | ICD-10-CM | POA: Diagnosis not present

## 2022-01-02 DIAGNOSIS — Z9049 Acquired absence of other specified parts of digestive tract: Secondary | ICD-10-CM | POA: Diagnosis not present

## 2022-01-02 DIAGNOSIS — E119 Type 2 diabetes mellitus without complications: Secondary | ICD-10-CM | POA: Insufficient documentation

## 2022-01-02 DIAGNOSIS — K420 Umbilical hernia with obstruction, without gangrene: Secondary | ICD-10-CM | POA: Diagnosis not present

## 2022-01-02 DIAGNOSIS — E669 Obesity, unspecified: Secondary | ICD-10-CM | POA: Diagnosis not present

## 2022-01-02 DIAGNOSIS — Z7984 Long term (current) use of oral hypoglycemic drugs: Secondary | ICD-10-CM | POA: Diagnosis not present

## 2022-01-02 HISTORY — PX: INSERTION OF MESH: SHX5868

## 2022-01-02 LAB — GLUCOSE, CAPILLARY
Glucose-Capillary: 133 mg/dL — ABNORMAL HIGH (ref 70–99)
Glucose-Capillary: 205 mg/dL — ABNORMAL HIGH (ref 70–99)

## 2022-01-02 SURGERY — REPAIR, HERNIA, UMBILICAL, ROBOT-ASSISTED
Anesthesia: General | Site: Abdomen

## 2022-01-02 MED ORDER — PROPOFOL 10 MG/ML IV BOLUS
INTRAVENOUS | Status: DC | PRN
  Administered 2022-01-02: 200 mg via INTRAVENOUS

## 2022-01-02 MED ORDER — ACETAMINOPHEN 10 MG/ML IV SOLN
INTRAVENOUS | Status: DC | PRN
Start: 2022-01-02 — End: 2022-01-02
  Administered 2022-01-02: 1000 mg via INTRAVENOUS

## 2022-01-02 MED ORDER — PHENYLEPHRINE HCL-NACL 20-0.9 MG/250ML-% IV SOLN
INTRAVENOUS | Status: DC | PRN
  Administered 2022-01-02: 50 ug/min via INTRAVENOUS

## 2022-01-02 MED ORDER — ONDANSETRON HCL 4 MG/2ML IJ SOLN: 4.0000 mg | Freq: Once | INTRAMUSCULAR | Status: DC | PRN

## 2022-01-02 MED ORDER — ROCURONIUM BROMIDE 100 MG/10ML IV SOLN
INTRAVENOUS | Status: DC | PRN
Start: 2022-01-02 — End: 2022-01-02
  Administered 2022-01-02: 50 mg via INTRAVENOUS
  Administered 2022-01-02: 10 mg via INTRAVENOUS
  Administered 2022-01-02: 20 mg via INTRAVENOUS

## 2022-01-02 MED ORDER — DEXAMETHASONE SODIUM PHOSPHATE 10 MG/ML IJ SOLN
INTRAMUSCULAR | Status: DC | PRN
  Administered 2022-01-02: 10 mg via INTRAVENOUS

## 2022-01-02 MED ORDER — ORAL CARE MOUTH RINSE: 15.0000 mL | Freq: Once | OROMUCOSAL | Status: AC

## 2022-01-02 MED ORDER — CHLORHEXIDINE GLUCONATE 0.12 % MT SOLN
OROMUCOSAL | Status: AC
  Administered 2022-01-02: 15 mL via OROMUCOSAL
  Filled 2022-01-02: qty 15

## 2022-01-02 MED ORDER — BUPIVACAINE-EPINEPHRINE (PF) 0.25% -1:200000 IJ SOLN
INTRAMUSCULAR | Status: AC
  Filled 2022-01-02: qty 30

## 2022-01-02 MED ORDER — FENTANYL CITRATE (PF) 100 MCG/2ML IJ SOLN
INTRAMUSCULAR | Status: AC
  Filled 2022-01-02: qty 2

## 2022-01-02 MED ORDER — EPHEDRINE SULFATE (PRESSORS) 50 MG/ML IJ SOLN
INTRAMUSCULAR | Status: DC | PRN
  Administered 2022-01-02: 10 mg via INTRAVENOUS
  Administered 2022-01-02: 5 mg via INTRAVENOUS

## 2022-01-02 MED ORDER — FENTANYL CITRATE (PF) 100 MCG/2ML IJ SOLN: 25.0000 ug | INTRAMUSCULAR | Status: DC | PRN

## 2022-01-02 MED ORDER — ONDANSETRON HCL 4 MG/2ML IJ SOLN
INTRAMUSCULAR | Status: DC | PRN
Start: 2022-01-02 — End: 2022-01-02
  Administered 2022-01-02: 4 mg via INTRAVENOUS

## 2022-01-02 MED ORDER — FENTANYL CITRATE (PF) 100 MCG/2ML IJ SOLN
INTRAMUSCULAR | Status: DC | PRN
  Administered 2022-01-02: 50 ug via INTRAVENOUS

## 2022-01-02 MED ORDER — FAMOTIDINE 20 MG PO TABS: 20.0000 mg | ORAL_TABLET | Freq: Once | ORAL | Status: AC

## 2022-01-02 MED ORDER — CEFAZOLIN SODIUM-DEXTROSE 2-4 GM/100ML-% IV SOLN
INTRAVENOUS | Status: AC
  Filled 2022-01-02: qty 100

## 2022-01-02 MED ORDER — SODIUM CHLORIDE 0.9 % IV SOLN: INTRAVENOUS | Status: DC

## 2022-01-02 MED ORDER — GLYCOPYRROLATE 0.2 MG/ML IJ SOLN
INTRAMUSCULAR | Status: DC | PRN
  Administered 2022-01-02: .2 mg via INTRAVENOUS

## 2022-01-02 MED ORDER — 0.9 % SODIUM CHLORIDE (POUR BTL) OPTIME
TOPICAL | Status: DC | PRN
  Administered 2022-01-02: 100 mL

## 2022-01-02 MED ORDER — ACETAMINOPHEN 10 MG/ML IV SOLN
INTRAVENOUS | Status: AC
  Filled 2022-01-02: qty 100

## 2022-01-02 MED ORDER — PHENYLEPHRINE HCL-NACL 20-0.9 MG/250ML-% IV SOLN
INTRAVENOUS | Status: AC
  Filled 2022-01-02: qty 250

## 2022-01-02 MED ORDER — LACTATED RINGERS IV SOLN
INTRAVENOUS | Status: DC | PRN
Start: 2022-01-02 — End: 2022-01-02

## 2022-01-02 MED ORDER — CHLORHEXIDINE GLUCONATE 0.12 % MT SOLN: 15.0000 mL | Freq: Once | OROMUCOSAL | Status: AC

## 2022-01-02 MED ORDER — CEFAZOLIN SODIUM-DEXTROSE 2-4 GM/100ML-% IV SOLN
2.0000 g | INTRAVENOUS | Status: AC
  Administered 2022-01-02: 2 g via INTRAVENOUS

## 2022-01-02 MED ORDER — SUGAMMADEX SODIUM 500 MG/5ML IV SOLN
INTRAVENOUS | Status: DC | PRN
  Administered 2022-01-02: 250 mg via INTRAVENOUS

## 2022-01-02 MED ORDER — DEXMEDETOMIDINE HCL IN NACL 200 MCG/50ML IV SOLN
INTRAVENOUS | Status: DC | PRN
  Administered 2022-01-02: 20 ug via INTRAVENOUS

## 2022-01-02 MED ORDER — OXYCODONE-ACETAMINOPHEN 5-325 MG PO TABS: 1.0000 | ORAL_TABLET | ORAL | 0 refills | Status: DC | PRN

## 2022-01-02 MED ORDER — LIDOCAINE HCL (CARDIAC) PF 100 MG/5ML IV SOSY
PREFILLED_SYRINGE | INTRAVENOUS | Status: DC | PRN
  Administered 2022-01-02: 100 mg via INTRAVENOUS

## 2022-01-02 MED ORDER — FAMOTIDINE 20 MG PO TABS
ORAL_TABLET | ORAL | Status: AC
  Administered 2022-01-02: 20 mg via ORAL
  Filled 2022-01-02: qty 1

## 2022-01-02 MED ORDER — HEMOSTATIC AGENTS (NO CHARGE) OPTIME
TOPICAL | Status: DC | PRN
  Administered 2022-01-02: 1 via TOPICAL

## 2022-01-02 MED ORDER — PROPOFOL 500 MG/50ML IV EMUL
INTRAVENOUS | Status: AC
  Filled 2022-01-02: qty 50

## 2022-01-02 MED ORDER — BUPIVACAINE-EPINEPHRINE (PF) 0.25% -1:200000 IJ SOLN
INTRAMUSCULAR | Status: DC | PRN
Start: 2022-01-02 — End: 2022-01-02
  Administered 2022-01-02: 20 mL
  Administered 2022-01-02: 10 mL

## 2022-01-02 MED ORDER — SUCCINYLCHOLINE CHLORIDE 200 MG/10ML IV SOSY
PREFILLED_SYRINGE | INTRAVENOUS | Status: DC | PRN
  Administered 2022-01-02: 100 mg via INTRAVENOUS

## 2022-01-02 SURGICAL SUPPLY — 55 items
ADH SKN CLS APL DERMABOND .7 (GAUZE/BANDAGES/DRESSINGS) ×2
APL SRG 38 LTWT LNG FL B (MISCELLANEOUS) ×2
APPLICATOR ARISTA FLEXITIP XL (MISCELLANEOUS) ×1 IMPLANT
BAG INFUSER PRESSURE 100CC (MISCELLANEOUS) ×1 IMPLANT
BASIN GRAD PLASTIC 32OZ STRL (MISCELLANEOUS) ×1 IMPLANT
BLADE SURG SZ11 CARB STEEL (BLADE) ×3 IMPLANT
CATH FOLEY 2WAY  5CC 16FR (CATHETERS) ×1
CATH FOLEY 2WAY 5CC 16FR (CATHETERS) ×2
CATH URTH 16FR FL 2W BLN LF (CATHETERS) IMPLANT
COVER TIP SHEARS 8 DVNC (MISCELLANEOUS) ×2 IMPLANT
COVER TIP SHEARS 8MM DA VINCI (MISCELLANEOUS) ×1
DERMABOND ADVANCED (GAUZE/BANDAGES/DRESSINGS) ×1
DERMABOND ADVANCED .7 DNX12 (GAUZE/BANDAGES/DRESSINGS) ×2 IMPLANT
DRAPE ARM DVNC X/XI (DISPOSABLE) ×6 IMPLANT
DRAPE COLUMN DVNC XI (DISPOSABLE) ×2 IMPLANT
DRAPE DA VINCI XI ARM (DISPOSABLE) ×3
DRAPE DA VINCI XI COLUMN (DISPOSABLE) ×1
ELECT REM PT RETURN 9FT ADLT (ELECTROSURGICAL) ×3
ELECTRODE REM PT RTRN 9FT ADLT (ELECTROSURGICAL) ×2 IMPLANT
GAUZE SPONGE 4X4 12PLY STRL (GAUZE/BANDAGES/DRESSINGS) ×1 IMPLANT
GLOVE SURG ENC MOIS LTX SZ6.5 (GLOVE) ×9 IMPLANT
GLOVE SURG UNDER POLY LF SZ6.5 (GLOVE) ×9 IMPLANT
GOWN STRL REUS W/ TWL LRG LVL3 (GOWN DISPOSABLE) ×6 IMPLANT
GOWN STRL REUS W/TWL LRG LVL3 (GOWN DISPOSABLE) ×12
HEMOSTAT ARISTA ABSORB 3G PWDR (HEMOSTASIS) ×1 IMPLANT
IRRIGATOR SUCT 8 DISP DVNC XI (IRRIGATION / IRRIGATOR) IMPLANT
IRRIGATOR SUCTION 8MM XI DISP (IRRIGATION / IRRIGATOR) ×1
IV CATH ANGIO 12GX3 LT BLUE (NEEDLE) IMPLANT
IV NS 1000ML (IV SOLUTION) ×3
IV NS 1000ML BAXH (IV SOLUTION) IMPLANT
KIT PINK PAD W/HEAD ARE REST (MISCELLANEOUS) ×3
KIT PINK PAD W/HEAD ARM REST (MISCELLANEOUS) ×2 IMPLANT
LABEL OR SOLS (LABEL) ×3 IMPLANT
MANIFOLD NEPTUNE II (INSTRUMENTS) ×3 IMPLANT
MESH VENTRALIGHT ST 4.5IN (Mesh General) ×1 IMPLANT
NDL INSUFFLATION 14GA 120MM (NEEDLE) ×2 IMPLANT
NEEDLE HYPO 22GX1.5 SAFETY (NEEDLE) ×3 IMPLANT
NEEDLE INSUFFLATION 14GA 120MM (NEEDLE) ×3 IMPLANT
NS IRRIG 500ML POUR BTL (IV SOLUTION) ×3 IMPLANT
OBTURATOR OPTICAL STANDARD 8MM (TROCAR) ×1
OBTURATOR OPTICAL STND 8 DVNC (TROCAR) ×2
OBTURATOR OPTICALSTD 8 DVNC (TROCAR) ×2 IMPLANT
PACK LAP CHOLECYSTECTOMY (MISCELLANEOUS) ×3 IMPLANT
SEAL CANN UNIV 5-8 DVNC XI (MISCELLANEOUS) ×6 IMPLANT
SEAL XI 5MM-8MM UNIVERSAL (MISCELLANEOUS) ×3
SET TUBE SMOKE EVAC HIGH FLOW (TUBING) ×3 IMPLANT
SOLUTION ELECTROLUBE (MISCELLANEOUS) ×3 IMPLANT
SPONGE T-LAP 4X18 ~~LOC~~+RFID (SPONGE) ×2 IMPLANT
SUT MNCRL 4-0 (SUTURE) ×6
SUT MNCRL 4-0 27XMFL (SUTURE) ×4
SUT STRATAFIX PDS 30 CT-1 (SUTURE) ×4 IMPLANT
SUT VICRYL 0 AB UR-6 (SUTURE) ×3 IMPLANT
SUT VLOC 90 2/L VL 12 GS22 (SUTURE) ×6 IMPLANT
SUTURE MNCRL 4-0 27XMF (SUTURE) ×2 IMPLANT
WATER STERILE IRR 500ML POUR (IV SOLUTION) ×2 IMPLANT

## 2022-01-02 NOTE — Interval H&P Note (Signed)
History and Physical Interval Note: ? ?01/02/2022 ?6:51 AM ? ?John [name]  has presented today for surgery, with the diagnosis of Q03.4 umbilical hernia w/o obstruction or gangrene.  The various methods of treatment have been discussed with the patient and family. After consideration of risks, benefits and other options for treatment, the patient has consented to  Procedure(s): ?XI [place] (N/A) as a surgical intervention.  The patient's history has been reviewed, patient examined, no change in status, stable for surgery.  I have reviewed the patient's chart and labs.  Questions were answered to the patient's satisfaction.   ? ? ?[name] ? ? ?

## 2022-01-02 NOTE — Anesthesia Preprocedure Evaluation (Signed)
Anesthesia Evaluation  ?Patient identified by MRN, date of birth, ID band ?Patient awake ? ? ? ?Reviewed: ?Allergy & Precautions, H&P , NPO status , Patient's Chart, lab work & pertinent test results, reviewed documented beta blocker date and time  ? ?History of Anesthesia Complications ?(+) PONV and history of anesthetic complications ? ?Airway ?Mallampati: II ? ?TM Distance: >3 FB ?Neck ROM: full ? ? ? Dental ? 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  ?Pulmonary exam normal ?breath sounds clear to auscultation ? ? ? ? ? ? Cardiovascular ?Exercise Tolerance: Good ?hypertension, (-) angina(-) Past MI and (-) Cardiac Stents Normal cardiovascular exam(-) dysrhythmias (-) Valvular Problems/Murmurs ?Rhythm:regular Rate:Normal ? ? ?  ?Neuro/Psych ?Seizures - (no longer on meds), Well Controlled,  PSYCHIATRIC DISORDERS Depression   ? GI/Hepatic ?Neg liver ROS, hiatal hernia, GERD  ,  ?Endo/Other  ?diabetes, Type 2, Oral Hypoglycemic Agents ? Renal/GU ?negative Renal ROS  ?negative genitourinary ?  ?Musculoskeletal ? ? Abdominal ?  ?Peds ? Hematology ?negative hematology ROS ?(+)   ?Anesthesia Other Findings ?Past Medical History: ?No date: Cancer [facility]) ?No date: Depression ?No date: Depression ?No date: Diabetes mellitus without complication ([place]) ?    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 ? Reproductive/Obstetrics ?negative OB ROS ? ?   ? ? ? ? ? ? ? ? ? ? ? ? ? ?  ?  ? ? ? ? ? ? ? ? ?Anesthesia Physical ?Anesthesia Plan ? ?ASA: 3 ? ?Anesthesia Plan: General  ? ?Post-op Pain Management:   ? ?Induction: Intravenous ? ?PONV Risk Score and Plan: 4 or greater and Ondansetron, Dexamethasone, Treatment may vary due to age or medical condition and Aprepitant ? ?Airway Management Planned: Oral ETT ? ?Additional Equipment:  ? ?Intra-op Plan:  ? ?Post-operative Plan: Extubation in OR ? ?Informed Consent: I have reviewed the patients History and Physical, chart, labs and discussed the procedure including the risks, benefits and alternatives for the proposed anesthesia with the patient or authorized representative who has indicated his/her understanding and acceptance.  ? ? ? ?Dental Advisory Given ? ?Plan Discussed with: Anesthesiologist, CRNA and Surgeon ? ?Anesthesia Plan Comments:   ? ? ? ? ? ? ?Anesthesia Quick Evaluation ? ?

## 2022-01-02 NOTE — Anesthesia Procedure Notes (Signed)
Procedure Name: Intubation ?Date/Time: 01/02/2022 7:40 AM ?Performed by: Beverely Low, CRNA ?Pre-anesthesia Checklist: Patient identified, Patient being monitored, Timeout performed, Emergency Drugs available and Suction available ?Patient Re-evaluated:Patient Re-evaluated prior to induction ?Oxygen Delivery Method: Circle system utilized ?Preoxygenation: Pre-oxygenation with 100% oxygen ?Induction Type: IV induction ?Ventilation: Mask ventilation without difficulty ?Laryngoscope Size: McGraph and 4 ?Grade View: Grade I ?Tube type: Oral ?Tube size: 7.5 mm ?Number of attempts: 1 ?Airway Equipment and Method: Stylet ?Placement Confirmation: ETT inserted through vocal cords under direct vision, positive ETCO2 and breath sounds checked- equal and bilateral ?Secured at: 21 cm ?Tube secured with: Tape ?Dental Injury: Teeth and Oropharynx as per pre-operative assessment  ? ? ? ? ?

## 2022-01-02 NOTE — Transfer of Care (Addendum)
Immediate Anesthesia Transfer of Care Note ? ?Patient: John [name] ? ?Procedure(s) Performed: Procedure(s): ?XI ROBOT ASSISTED UMBILICAL HERNIA REPAIR (N/A) ? ?Patient Location: PACU ? ?Anesthesia Type:General ? ?Level of Consciousness: sedated ? ?Airway & Oxygen Therapy: Patient Spontanous Breathing and Patient connected to face mask oxygen ? ?Post-op Assessment: Report given to RN and Post -op Vital signs reviewed and stable ? ?Post vital signs: Reviewed and stable ? ?Last Vitals:  ?Vitals:  ? 01/02/22 0600 01/02/22 1133  ?BP: (!) 144/87 (!) 108/57  ?Pulse: (!) 55   ?Resp: 18 (!) 9  ?Temp: (!) 36.2 ?C   ?SpO2: 100% 97%  ? ? ?Complications: No apparent anesthesia complications ?

## 2022-01-02 NOTE — Discharge Instructions (Addendum)
?  Diet: Resume home heart healthy regular diet.  ? ?Activity: No heavy lifting >20 pounds (children, pets, laundry, garbage) or strenuous activity until follow-up, but light activity and walking are encouraged. Do not drive or drink alcohol if taking narcotic pain medications. ? ?Wound care: May shower with soapy water and pat dry (do not rub incisions), but no baths or submerging incision underwater until follow-up. (no swimming)  ? ?Medications: Resume all home medications. For mild to moderate pain: acetaminophen (Tylenol) or ibuprofen (if no kidney disease). Combining Tylenol with alcohol can substantially increase your risk of causing liver disease. Narcotic pain medications, if prescribed, can be used for severe pain, though may cause nausea, constipation, and drowsiness. If you do not need the narcotic pain medication, you do not need to fill the prescription. ? ?Call office ([phone]) at any time if any questions, worsening pain, fevers/chills, bleeding, drainage from incision site, or other concerns. ? ? ?AMBULATORY SURGERY  ?DISCHARGE INSTRUCTIONS ? ? ?The drugs that you were given will stay in your system until tomorrow so for the next 24 hours you should not: ? ?Drive an automobile ?Make any legal decisions ?Drink any alcoholic beverage ? ? ?You may resume regular meals tomorrow.  Today it is better to start with liquids and gradually work up to solid foods. ? ?You may eat anything you prefer, but it is better to start with liquids, then soup and crackers, and gradually work up to solid foods. ? ? ?Please notify your doctor immediately if you have any unusual bleeding, trouble breathing, redness and pain at the surgery site, drainage, fever, or pain not relieved by medication. ? ? ? ?Additional Instructions: ? ? ? ? ? ? ? ?Please contact your physician with any problems or Same Day Surgery at [phone], Monday through Friday 6 am to 4 pm, or [place] at [place] Main number at [phone].  ?

## 2022-01-02 NOTE — Op Note (Signed)
Preoperative diagnosis: Umbilical Hernia ? ?Postoperative diagnosis: Incarcerated umbilical Hernia ? ?Procedure: Robotic assisted laparoscopic incarcerated umbilical hernia repair with mesh ? ?Anesthesia: General ? ?Surgeon: Dr. Windell Moment ? ?Wound Classification: Clean ? ?Specimen: Non3 ? ?Complications: None ? ?Estimated Blood Loss: 50 ml ? ?Indications: ?Patient is a 81 y.o. male developed a ventral hernia. This was symptomatic and incarcerated and repair was indicated.  ? ?Findings: ?A 4 cm incarcerated umbilical hernia ?2. Repair achieved with closure of the anterior fascia at midline and 11.4 cm Bard mesh ?3. Adequate hemostasis ? ?Description of procedure: The patient was brought to the operating room and general anesthesia was induced. A time-out was completed verifying correct patient, procedure, site, positioning, and implant(s) and/or special equipment prior to beginning this procedure. Antibiotics were administered prior to making the incision. SCDs placed. The anterior abdominal wall was prepped and draped in the standard sterile fashion.  ? ?Palmer's point chosen for entry.  Veress needle placed and abdomen insufflated to 15cm without any dramatic increase in pressure.  Needle removed and optiview technique used to place 35m port at same point.  No injury noted during placement. Two additional ports, 838mx2 along left lateral aspect placed.  Xi robot then docked into place.  Hernia contents noted and reduced with combination of blunt, sharp dissection with scissors and fenestrated forceps.  Hemostasis achieved throughout this portion.  Once all hernia contents reduced, there was noted to be a 4 cm hernia.   ? ?Insufflation dropped to 1046mnd transfacial suture with 0 stratafix used to primarily close defect under minimal tension. Bard protected 11.4 cm mesh was placed within the abdominal cavity through 63m72mrt and secured to the abdominal wall centered over the defect using the 0 stratafix  previously used to primarily close defect.  The mesh was then circumferentially sutured into the anterior abdominal wall using 2-0 VLock x2.  Any bleeding noted during this portion was no longer actively bleeding by end of securing mesh and tightening the suture.   ? ?Robot was undocked.  Abdomen then desufflated while camera within abdomen to ensure no signs of new bleed prior to removing camera and rest of ports completely. All skin incisions closed with runninrg 4-0 Monocryl in a subcuticular fashion.  All wounds then dressed with Dermabond. ? ?Patient was then successfully awakened and transferred to PACU in stable condition.  At the end of the procedure sponge and instrument counts were correct. ? ?

## 2022-01-03 ENCOUNTER — Encounter: Payer: Self-pay | Admitting: General Surgery

## 2022-01-03 NOTE — Anesthesia Postprocedure Evaluation (Signed)
Anesthesia Post Note ? ?Patient: John [name] ? ?Procedure(s) Performed: XI ROBOT ASSISTED UMBILICAL HERNIA REPAIR (Abdomen) ?INSERTION OF MESH ? ?Patient location during evaluation: PACU ?Anesthesia Type: General ?Level of consciousness: awake and alert ?Pain management: pain level controlled ?Vital Signs Assessment: post-procedure vital signs reviewed and stable ?Respiratory status: spontaneous breathing, nonlabored ventilation, respiratory function stable and patient connected to nasal cannula oxygen ?Cardiovascular status: blood pressure returned to baseline and stable ?Postop Assessment: no apparent nausea or vomiting ?Anesthetic complications: no ? ? ?No notable events documented. ? ? ?Last Vitals:  ?Vitals:  ? 01/02/22 1230 01/02/22 1234  ?BP: 131/70 129/87  ?Pulse: (!) 57 (!) 53  ?Resp: 13 17  ?Temp: 36.4 ?C (!) 36.2 ?C  ?SpO2: 100% 100%  ?  ?Last Pain:  ?Vitals:  ? 01/03/22 1038  ?TempSrc:   ?PainSc: 3   ? ? ?  ?  ?  ?  ?  ?  ? ?Martha Clan ? ? ? ? ?

## 2022-01-24 ENCOUNTER — Encounter: Payer: Medicare Other | Attending: Family Medicine | Admitting: *Deleted

## 2022-01-24 ENCOUNTER — Encounter: Payer: Self-pay | Admitting: *Deleted

## 2022-01-24 VITALS — Wt 218.1 lb

## 2022-01-24 DIAGNOSIS — E1165 Type 2 diabetes mellitus with hyperglycemia: Secondary | ICD-10-CM | POA: Diagnosis present

## 2022-01-24 NOTE — Progress Notes (Signed)

## 2022-01-31 ENCOUNTER — Encounter: Payer: Self-pay | Admitting: *Deleted

## 2022-01-31 ENCOUNTER — Encounter: Payer: Medicare Other | Attending: Family Medicine | Admitting: *Deleted

## 2022-01-31 VITALS — Wt 219.6 lb

## 2022-01-31 DIAGNOSIS — Z713 Dietary counseling and surveillance: Secondary | ICD-10-CM | POA: Insufficient documentation

## 2022-01-31 DIAGNOSIS — E119 Type 2 diabetes mellitus without complications: Secondary | ICD-10-CM

## 2022-01-31 NOTE — Progress Notes (Signed)

## 2022-02-07 ENCOUNTER — Encounter: Payer: Medicare Other | Admitting: *Deleted

## 2022-02-07 ENCOUNTER — Encounter: Payer: Self-pay | Admitting: *Deleted

## 2022-02-07 VITALS — BP 120/82 | Wt 220.4 lb

## 2022-02-07 DIAGNOSIS — E119 Type 2 diabetes mellitus without complications: Secondary | ICD-10-CM

## 2022-02-07 NOTE — Progress Notes (Signed)

## 2022-02-08 ENCOUNTER — Encounter: Payer: Self-pay | Admitting: *Deleted

## 2022-03-12 ENCOUNTER — Other Ambulatory Visit: Payer: Self-pay

## 2022-03-12 ENCOUNTER — Emergency Department (EMERGENCY_DEPARTMENT_HOSPITAL)
Admission: EM | Admit: 2022-03-12 | Discharge: 2022-03-13 | Disposition: A | Discharge status: Other Acute Inpatient Hospital | Payer: Medicare Other | Source: Home / Self Care | Attending: Emergency Medicine | Admitting: Emergency Medicine

## 2022-03-12 DIAGNOSIS — H5710 Ocular pain, unspecified eye: Secondary | ICD-10-CM | POA: Insufficient documentation

## 2022-03-12 DIAGNOSIS — F419 Anxiety disorder, unspecified: Secondary | ICD-10-CM | POA: Insufficient documentation

## 2022-03-12 DIAGNOSIS — F329 Major depressive disorder, single episode, unspecified: Secondary | ICD-10-CM | POA: Insufficient documentation

## 2022-03-12 DIAGNOSIS — F32A Depression, unspecified: Secondary | ICD-10-CM | POA: Diagnosis present

## 2022-03-12 DIAGNOSIS — Z20822 Contact with and (suspected) exposure to covid-19: Secondary | ICD-10-CM | POA: Insufficient documentation

## 2022-03-12 DIAGNOSIS — F332 Major depressive disorder, recurrent severe without psychotic features: Secondary | ICD-10-CM

## 2022-03-12 LAB — URINE DRUG SCREEN, QUALITATIVE (ARMC ONLY)
Amphetamines, Ur Screen: NOT DETECTED
Barbiturates, Ur Screen: NOT DETECTED
Benzodiazepine, Ur Scrn: NOT DETECTED
Cannabinoid 50 Ng, Ur ~~LOC~~: NOT DETECTED
Cocaine Metabolite,Ur ~~LOC~~: NOT DETECTED
MDMA (Ecstasy)Ur Screen: NOT DETECTED
Methadone Scn, Ur: NOT DETECTED
Opiate, Ur Screen: NOT DETECTED
Phencyclidine (PCP) Ur S: NOT DETECTED
Tricyclic, Ur Screen: NOT DETECTED

## 2022-03-12 LAB — SALICYLATE LEVEL: Salicylate Lvl: <7 mg/dL — ABNORMAL LOW (ref 7.0–30.0)

## 2022-03-12 LAB — COMPREHENSIVE METABOLIC PANEL
ALT: 29 U/L (ref 0–44)
AST: 30 U/L (ref 15–41)
Albumin: 4.2 g/dL (ref 3.5–5.0)
Alkaline Phosphatase: 57 U/L (ref 38–126)
Anion gap: 7 (ref 5–15)
BUN: 21 mg/dL (ref 8–23)
CO2: 26 mmol/L (ref 22–32)
Calcium: 9.4 mg/dL (ref 8.9–10.3)
Chloride: 106 mmol/L (ref 98–111)
Creatinine, Ser: 0.86 mg/dL (ref 0.61–1.24)
GFR, Estimated: >60 mL/min (ref >60)
Glucose, Bld: 123 mg/dL — ABNORMAL HIGH (ref 70–99)
Potassium: 4.1 mmol/L (ref 3.5–5.1)
Sodium: 139 mmol/L (ref 135–145)
Total Bilirubin: 1.6 mg/dL — ABNORMAL HIGH (ref 0.3–1.2)
Total Protein: 7.1 g/dL (ref 6.5–8.1)

## 2022-03-12 LAB — CBC
HCT: 52 % (ref 39.0–52.0)
Hemoglobin: 17.3 g/dL — ABNORMAL HIGH (ref 13.0–17.0)
MCH: 31.9 pg (ref 26.0–34.0)
MCHC: 33.3 g/dL (ref 30.0–36.0)
MCV: 95.9 fL (ref 80.0–100.0)
Platelets: 199 10*3/uL (ref 150–400)
RBC: 5.42 MIL/uL (ref 4.22–5.81)
RDW: 12.9 % (ref 11.5–15.5)
WBC: 6.6 10*3/uL (ref 4.0–10.5)
nRBC: 0 % (ref 0.0–0.2)

## 2022-03-12 LAB — RESP PANEL BY RT-PCR (FLU A&B, COVID) ARPGX2
Influenza A by PCR: NEGATIVE
Influenza B by PCR: NEGATIVE
SARS Coronavirus 2 by RT PCR: NEGATIVE

## 2022-03-12 LAB — ACETAMINOPHEN LEVEL: Acetaminophen (Tylenol), Serum: <10 ug/mL — ABNORMAL LOW (ref 10–30)

## 2022-03-12 LAB — ETHANOL: Alcohol, Ethyl (B): <10 mg/dL (ref <10)

## 2022-03-12 MED ORDER — VENLAFAXINE HCL 37.5 MG PO TABS
37.5000 mg | ORAL_TABLET | Freq: Every day | ORAL | Status: DC
  Administered 2022-03-12: 37.5 mg via ORAL
  Filled 2022-03-12 (×2): qty 1

## 2022-03-12 MED ORDER — HYDROXYZINE HCL 25 MG PO TABS
25.0000 mg | ORAL_TABLET | Freq: Once | ORAL | Status: AC
  Administered 2022-03-12: 25 mg via ORAL
  Filled 2022-03-12: qty 1

## 2022-03-12 NOTE — ED Notes (Signed)
Given nighttime snack ?

## 2022-03-12 NOTE — ED Notes (Signed)
Hospital meal provided.  100% consumed, pt tolerated w/o complaints.  Waste discarded appropriately.   

## 2022-03-12 NOTE — ED Notes (Signed)
Pt given dinner tray.

## 2022-03-12 NOTE — ED Notes (Signed)
Pt reports feeling anxious.  Md aware.  Meds ordered and given.   ?

## 2022-03-12 NOTE — ED Triage Notes (Signed)
Pt to ED via POV from home. Pt voluntary. Pt reports depression x3 months. Pt reports up and downs over the last several months and he is concerned it will become thoughts of him hurting himself. Pt reports within the last days he feels like his thoughts have been racing and feels trapped. Pt denies any current SI or plan.  Pt went to [place] on Sunday.  ? ?Pt reports early March he had hernia repair and started having problems with his vision and states he has been increasingly worried that the surgery has caused issues.  ? ? ? ? ?

## 2022-03-12 NOTE — ED Notes (Signed)
Patient provided snack at appropriate snack time.  Pt consumed 100% of snack provided, tolerated well w/o complaints   Trash disposted of appropriately by patient.  

## 2022-03-12 NOTE — BH Assessment (Signed)
Adult/[facility] MH ? ?Referral information for Psychiatric Hospitalization faxed to: ? ?? [facility] ([phone]) ? ?? [facility] [phone]- [phone], [phone]) ? ?? [place] ([phone], ? ?. [facility]([phone]-or- [phone] ?

## 2022-03-12 NOTE — BH Assessment (Signed)
Comprehensive Clinical Assessment (CCA) Note  03/12/2022 John [name] [id number]  John [name], 81 year old male who presents to [facility] ED voluntarily for treatment. Per triage note, Pt to ED via POV from home. Pt voluntary. Pt reports depression x3 months. Pt reports up and downs over the last several months and he is concerned it will become thoughts of him hurting himself. Pt reports within the last days he feels like his thoughts have been racing and feels trapped. Pt denies any current SI or plan.  Pt went to [facility] on Sunday. Pt reports early March he had hernia repair and started having problems with his vision and states he has been increasingly worried that the surgery has caused issues.   During TTS assessment pt presents alert and oriented x 4, anxious but cooperative, and mood-congruent with affect. The pt does not appear to be responding to internal or external stimuli. Neither is the pt presenting with any delusional thinking. Pt verified the information provided to triage RN.   Pt identifies his main complaint to be that he is nervous, anxious, and depressed. Patient reports he feels overwhelmed with stress, having panic attacks, worried and burdened from all the things he cannot do. Patient reports symptoms have dated back 25+ years; however, he was seeing a therapist regularly and did not require medication. Patient states he has been hospitalized 4-5 times but not in the last 20 years. Patient reports his therapist retired and he has yet to find a therapist that he is comfortable with. Patient reports the onset of his most recent episode happened in March after having hernia surgery. Patient reports he has always been concerned about anesthesia and states he did not feel right afterwards. Patient reports having issues with his eyes which have since been cleared but now he states the anxiety is beginning to dominate his thinking and he has not been able to "shake it" as he has in the past. "I  normally find something to do, be active and the feelings generally disappear but now, it just ruminates." Patient denies using any illicit substances and alcohol. Patient reports poor sleeping habits, yet he is eating surprisingly good. Patient lives with his wife and has 2 adult children. Patient states they have been incredibly supportive throughout the years, and he is comfortable talking to them about his problems. Pt reports a medical hx of diabetes. Patient is being followed by his provider Dr. Terance Hart and was prescribed medication earlier today but things did not improve, and he was desperate so he came to the ED. Patient believes inpatient treatment would be beneficial at this time.    Per Sallye Ober, NP pt is recommended for inpatient psychiatric admission.   Chief Complaint:  Chief Complaint  Patient presents with   Mental Health Problem   Visit Diagnosis: Clinical depression    CCA Screening, Triage and Referral (STR)  Patient Reported Information How did you hear about [place]? Self  Referral name: No data recorded Referral phone number: No data recorded  Whom do you see for routine medical problems? No data recorded Practice/Facility Name: No data recorded Practice/Facility Phone Number: No data recorded Name of Contact: No data recorded Contact Number: No data recorded Contact Fax Number: No data recorded Prescriber Name: No data recorded Prescriber Address (if known): No data recorded  What Is the Reason for Your Visit/Call Today? Patient presents to ED voluntarily with depression and suicidal ideations.  How Long Has This Been Causing You Problems? 1-6 months  What Do  You Feel Would Help You the Most Today? Medication(s); Treatment for Depression or other mood problem; Stress Management   Have You Recently Been in Any Inpatient Treatment (Hospital/Detox/Crisis Center/28-Day Program)? No data recorded Name/Location of Program/Hospital:No data recorded How Long Were You  There? No data recorded When Were You Discharged? No data recorded  Have You Ever Received Services From [facility] Before? No data recorded Who Do You See at [facility]? No data recorded  Have You Recently Had Any Thoughts About Hurting Yourself? Yes  Are You Planning to Commit Suicide/Harm Yourself At This time? No   Have you Recently Had Thoughts About Hurting Someone Karolee Ohs? No  Explanation: No data recorded  Have You Used Any Alcohol or Drugs in the Past 24 Hours? No  How Long Ago Did You Use Drugs or Alcohol? No data recorded What Did You Use and How Much? No data recorded  Do You Currently Have a Therapist/Psychiatrist? No  Name of Therapist/Psychiatrist: No data recorded  Have You Been Recently Discharged From Any Office Practice or Programs? No  Explanation of Discharge From Practice/Program: No data recorded    CCA Screening Triage Referral Assessment Type of Contact: Face-to-Face  Is this Initial or Reassessment? No data recorded Date Telepsych consult ordered in [facility]:  No data recorded Time Telepsych consult ordered in [facility]:  No data recorded  Patient Reported Information Reviewed? No data recorded Patient Left Without Being Seen? No data recorded Reason for Not Completing Assessment: No data recorded  Collateral Involvement: None provided   Does Patient Have a Court Appointed Legal Guardian? No data recorded Name and Contact of Legal Guardian: No data recorded If Minor and Not Living with Parent(s), Who has Custody? n/a  Is CPS involved or ever been involved? Never  Is APS involved or ever been involved? Never   Patient Determined To Be At Risk for Harm To Self or Others Based on Review of Patient Reported Information or Presenting Complaint? No  Method: No data recorded Availability of Means: No data recorded Intent: No data recorded Notification Required: No data recorded Additional Information for Danger to Others Potential: No data  recorded Additional Comments for Danger to Others Potential: No data recorded Are There Guns or Other Weapons in Your Home? No data recorded Types of Guns/Weapons: No data recorded Are These Weapons Safely Secured?                            No data recorded Who Could Verify You Are Able To Have These Secured: No data recorded Do You Have any Outstanding Charges, Pending Court Dates, Parole/Probation? No data recorded Contacted To Inform of Risk of Harm To Self or Others: No data recorded  Location of Assessment: [facility]   Does Patient Present under Involuntary Commitment? No  IVC Papers Initial File Date: No data recorded  [place] of Residence: [place]   Patient Currently Receiving the Following Services: Medication Management   Determination of Need: Emergent (2 hours)   Options For Referral: ED Visit; Inpatient Hospitalization; Medication Management; Intensive Outpatient Therapy     CCA Biopsychosocial Intake/Chief Complaint:  No data recorded Current Symptoms/Problems: No data recorded  Patient Reported Schizophrenia/Schizoaffective Diagnosis in Past: No   Strengths: Patient able to verbalize and communicate wants and needs.  Preferences: No data recorded Abilities: No data recorded  Type of Services Patient Feels are Needed: No data recorded  Initial Clinical Notes/Concerns: No data recorded  Mental Health  Symptoms Depression:   Difficulty Concentrating; Change in energy/activity; Sleep (too much or little); Hopelessness   Duration of Depressive symptoms:  Greater than two weeks   Mania:   None   Anxiety:    Difficulty concentrating; Restlessness; Sleep; Worrying; Tension   Psychosis:   None   Duration of Psychotic symptoms: No data recorded  Trauma:   None   Obsessions:   None   Compulsions:   None   Inattention:   None   Hyperactivity/Impulsivity:   None   Oppositional/Defiant Behaviors:   None   Emotional Irregularity:    Chronic feelings of emptiness   Other Mood/Personality Symptoms:  No data recorded   Mental Status Exam Appearance and self-care  Stature:   Average   Weight:   Average weight   Clothing:   Casual   Grooming:   Normal   Cosmetic use:   None   Posture/gait:   Slumped   Motor activity:   Not Remarkable   Sensorium  Attention:   Normal   Concentration:   Normal   Orientation:   X5   Recall/memory:   Normal   Affect and Mood  Affect:   Depressed; Anxious   Mood:   Depressed; Anxious   Relating  Eye contact:   Normal   Facial expression:   Depressed; Anxious   Attitude toward examiner:   Cooperative   Thought and Language  Speech flow:  Clear and Coherent   Thought content:   Appropriate to Mood and Circumstances   Preoccupation:   Ruminations   Hallucinations:   None   Organization:  No data recorded  [employer] of Knowledge:   Average   Intelligence:   Average   Abstraction:   Functional   Judgement:   Fair   Dance movement psychotherapist:   Realistic   Insight:   Good   Decision Making:   Normal   Social Functioning  Social Maturity:   Responsible   Social Judgement:   Normal   Stress  Stressors:   Illness   Coping Ability:   Human resources officer Deficits:   None   Supports:   Family; Friends/Service system     Religion:    Leisure/Recreation:    Exercise/Diet: Exercise/Diet Do You Have Any Trouble Sleeping?: Yes Explanation of Sleeping Difficulties: Patient reports he has trouble staying asleep.   CCA Employment/Education Employment/Work Situation: Employment / Work Systems developer: Retired  Education: Education Is Patient Currently Attending School?: No Did Theme park manager?: Yes   CCA Family/Childhood History Family and Relationship History: Family history Marital status: Married Does patient have children?: Yes How many children?: 2 How is patient's  relationship with their children?: Good  Childhood History:     Child/Adolescent Assessment:     CCA Substance Use Alcohol/Drug Use: Alcohol / Drug Use Pain Medications: See PTA Prescriptions: See PTA Over the Counter: See PTA History of alcohol / drug use?: No history of alcohol / drug abuse                         ASAM's:  Six Dimensions of Multidimensional Assessment  Dimension 1:  Acute Intoxication and/or Withdrawal Potential:      Dimension 2:  Biomedical Conditions and Complications:      Dimension 3:  Emotional, Behavioral, or Cognitive Conditions and Complications:     Dimension 4:  Readiness to Change:     Dimension 5:  Relapse, Continued use, or  Continued Problem Potential:     Dimension 6:  Recovery/Living Environment:     ASAM Severity Score:    ASAM Recommended Level of Treatment:     Substance use Disorder (SUD)    Recommendations for Services/Supports/Treatments:    DSM5 Diagnoses: Patient Active Problem List   Diagnosis Date Noted   History of seizure 06/21/2016   Headache, migraine 12/22/2015   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   Malignant neoplasm of prostate (HCC) 06/19/2015   Prostate cancer (HCC) 06/04/2015   Clinical depression 06/13/2014   BP (high blood pressure) 06/13/2014   Apnea, sleep 06/13/2014   Recurrent major depressive disorder, 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

## 2022-03-12 NOTE — Consult Note (Signed)
[facility] Consult   Reason for Consult:  anxiety and worsening depression Referring Physician:  Cinda Quest Patient Identification: John [name] MRN:  [id number] Principal Diagnosis: Clinical depression Diagnosis:  Principal Problem:   Clinical depression   Total Time spent with patient: 45 minutes  [name] is a 81 y.o. male patient admitted with anxiety and worsening thoughts of not wanting to be alive.Marland Kitchen  HPI: Patient has long history of anxiety and depression, which he identifies as stemming from a head injury at 81 years of age.  He has been functioning very well throughout his life.  He is a retired Chief Financial Officer.  He has been on a low-dose of venlafaxine for many years.  He has had a few hospitalizations, the last being in the 1990s.  The only suicide attempt that he describes is putting a bag over his head many years ago, but then he took it off.  He sees his primary care physician for medication management and has had a therapist for many years and knew "spiritual director" for the last year and a half, with whom he has a good relationship.   Patient presented voluntarily from home after worsening depression and anxiety.  Patient states that he had hernia surgery about 2 months ago and then started developing problems with his eyes.  He has seen his ophthalmologist and that problem has cleared up, but patient continues to have a lot of anxiety and depression, many times feeling overwhelmed and that life is not worth living.  He identifies his wife, family, and close friends as of great help to him.  He feels better after he talks at them, but that only lasts for a short time.  Patient does feel that he is often occupied with physical symptoms and worries a lot about that.  Patient says he came to the hospital because he believes that he needs some kind of adjustment in medication.  He has started thinking about looking for ways that he could kill himself. Patient is  very pleasant, articulate.  He speaks in linear sentences.  He has forward thinking thoughts.  Describes poor sleep and fair appetite  Recommend inpatient psychiatric hospitalization to the geriatric unit.  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   Eczema    Elevated PSA    H/O Osgood-Schlatter disease    Hand deformity, congenital    Hearing loss bilateral   Hemorrhoids    Horner syndrome    Hypertension    Migraine    Obesity    PONV (postoperative nausea and vomiting)    Prostate cancer ([place]) 2020   Rosacea    Sleep apnea     Past Surgical History:  Procedure Laterality Date   ANKLE ARTHROSCOPY Left    fracture   CHOLECYSTECTOMY  2007   CRANIOTOMY  1958   left frontal craniotomy for epidural hematoma   INSERTION OF MESH  01/02/2022   Procedure: INSERTION OF MESH;  Surgeon: Herbert Pun, MD;  Location: [facility] ORS;  Service: General;;   LIGAMENT REPAIR Right    thumb   RETINAL DETACHMENT SURGERY  TREATMENT FISTULA ANAL  2006   Family History:  Family History  Problem Relation Age of Onset   Osteoporosis Mother    Depression Mother    Prostate cancer Father    Hypertension Father    Diabetes Brother    Kidney disease Neg Hx    Family Psychiatric  History: None reported Social History:  Social History   Substance and Sexual Activity  Alcohol Use Not Currently     Social History   Substance and Sexual Activity  Drug Use No    Social History   Socioeconomic History   Marital status: Married    Spouse name: [name]   Number of children: 2   Years of education: Not on file   Highest education level: Not on file  Occupational History    Not on file  Tobacco Use   Smoking status: Never   Smokeless tobacco: Never  Vaping Use   Vaping Use: Never used  Substance and Sexual Activity   Alcohol use: Not Currently   Drug use: No   Sexual activity: Yes    Birth control/protection: None  Other Topics Concern   Not on file  Social History Narrative   Live at home with wife.    Social Determinants of Health   Financial Resource Strain: Not on file  Food Insecurity: Not on file  Transportation Needs: Not on file  Physical Activity: Not on file  Stress: Not on file  Social Connections: Not on file   Additional Social History:    Allergies:  No Known Allergies  Labs:  Results for orders placed or performed during the hospital encounter of 03/12/22 (from the past 48 hour(s))  Comprehensive metabolic panel     Status: Abnormal   Collection Time: 03/12/22 11:36 AM  Result Value Ref Range   Sodium 139 135 - 145 mmol/L   Potassium 4.1 3.5 - 5.1 mmol/L   Chloride 106 98 - 111 mmol/L   CO2 26 22 - 32 mmol/L   Glucose, Bld 123 (H) 70 - 99 mg/dL    Comment: Glucose reference range applies only to samples taken after fasting for at least 8 hours.   BUN 21 8 - 23 mg/dL   Creatinine, Ser 0.86 0.61 - 1.24 mg/dL   Calcium 9.4 8.9 - 10.3 mg/dL   Total Protein 7.1 6.5 - 8.1 g/dL   Albumin 4.2 3.5 - 5.0 g/dL   AST 30 15 - 41 U/L   ALT 29 0 - 44 U/L   Alkaline Phosphatase 57 38 - 126 U/L   Total Bilirubin 1.6 (H) 0.3 - 1.2 mg/dL   GFR, Estimated >60 >60 mL/min    Comment: (NOTE) Calculated using the CKD-EPI Creatinine Equation (2021)    Anion gap 7 5 - 15    Comment: Performed at [facility], [address] [place], [place] [address]  Ethanol     Status: None   Collection Time: 03/12/22 11:36 AM  Result Value Ref Range   Alcohol, Ethyl (B) <10 <10 mg/dL    Comment: (NOTE) Lowest detectable limit for serum alcohol is 10 mg/dL.  For medical purposes only. Performed at [facility], [address] [place], [place] [address]   Salicylate level     Status: Abnormal   Collection Time: 03/12/22 11:36 AM  Result Value Ref Range   Salicylate Lvl <2.9 (L) 7.0 - 30.0 mg/dL    Comment: Performed at [facility], [address] [place], [place] [address]  Acetaminophen level     Status: Abnormal   Collection Time: 03/12/22 11:36 AM  Result Value Ref Range   Acetaminophen (Tylenol), Serum <10 (L) 10 - 30 ug/mL    Comment: (NOTE) Therapeutic concentrations vary significantly. A range of 10-30 ug/mL  may be an effective concentration for many patients. However, some  are best treated at concentrations outside of this range. Acetaminophen concentrations >150 ug/mL at 4 hours after ingestion  and >50 ug/mL at 12 hours after ingestion are often associated with  toxic reactions.  Performed at [facility], [address] [place], [place] [address]   cbc     Status: Abnormal   Collection Time: 03/12/22 11:36 AM  Result Value Ref Range   WBC 6.6 4.0 - 10.5 K/uL   RBC 5.42 4.22 - 5.81 MIL/uL   Hemoglobin 17.3 (H) 13.0 - 17.0 g/dL   HCT 52.0 39.0 - 52.0 %   MCV 95.9 80.0 - 100.0 fL   MCH 31.9 26.0 - 34.0 pg   MCHC 33.3 30.0 - 36.0 g/dL   RDW 12.9 11.5 - 15.5 %   Platelets 199 150 - 400 K/uL   nRBC 0.0 0.0 - 0.2 %    Comment: Performed at [facility], [address] [place], [place] [address]  Resp Panel by RT-PCR (Flu A&B, Covid) Nasopharyngeal Swab     Status: None   Collection Time: 03/12/22  1:19 PM   Specimen: Nasopharyngeal Swab; Nasopharyngeal(NP) swabs in vial transport medium  Result Value Ref Range   SARS Coronavirus 2 by RT PCR NEGATIVE NEGATIVE    Comment: (NOTE) SARS-CoV-2 target nucleic acids are NOT DETECTED.  The SARS-CoV-2 RNA is generally detectable in upper respiratory specimens during the acute phase of infection. The lowest concentration of SARS-CoV-2 viral copies this assay can detect is 138 copies/mL. A negative result does  not preclude SARS-Cov-2 infection and should not be used as the sole basis for treatment or other patient management decisions. A negative result may occur with  improper specimen collection/handling, submission of specimen other than nasopharyngeal swab, presence of viral mutation(s) within the areas targeted by this assay, and inadequate number of viral copies(<138 copies/mL). A negative result must be combined with clinical observations, patient history, and epidemiological information. The expected result is Negative.  Fact Sheet for Patients:  EntrepreneurPulse.com.au  Fact Sheet for Healthcare Providers:  IncredibleEmployment.be  This test is no t yet approved or cleared by the [place] FDA and  has been authorized for detection and/or diagnosis of SARS-CoV-2 by FDA under an Emergency Use Authorization (EUA). This EUA will remain  in effect (meaning this test can be used) for the duration of the COVID-19 declaration under Section 564(b)(1) of the Act, 21 U.S.C.section 360bbb-3(b)(1), unless the authorization is terminated  or revoked sooner.       Influenza A by PCR NEGATIVE NEGATIVE   Influenza B by PCR NEGATIVE NEGATIVE    Comment: (NOTE) The Xpert Xpress SARS-CoV-2/FLU/RSV plus assay is intended as an aid in the diagnosis of influenza from Nasopharyngeal swab specimens and should not be used as a sole basis for treatment. Nasal washings and aspirates are unacceptable for Xpert Xpress SARS-CoV-2/FLU/RSV testing.  Fact Sheet for Patients: EntrepreneurPulse.com.au  Fact Sheet for Healthcare Providers: IncredibleEmployment.be  This test is not yet approved or cleared by the [place] FDA and has been authorized for detection and/or diagnosis of SARS-CoV-2 by FDA under an Emergency Use Authorization (EUA). This EUA will remain in effect (meaning  this test can be used) for the duration of  the COVID-19 declaration under Section 564(b)(1) of the Act, 21 U.S.C. section 360bbb-3(b)(1), unless the authorization is terminated or revoked.  Performed at [facility], [address] [place], [place] [address]     Current Facility-Administered Medications  Medication Dose Route Frequency Provider Last Rate Last Admin   gentamicin (GARAMYCIN) injection 80 mg  80 mg Intramuscular Once Hollice Espy, MD       levofloxacin [facility]) tablet 500 mg  500 mg Oral Once Hollice Espy, MD       Current Outpatient Medications  Medication Sig Dispense Refill   aspirin EC 81 MG tablet Take 81 mg by mouth daily.     atenolol (TENORMIN) 50 MG tablet Take 50 mg by mouth daily.  0   buPROPion ER (WELLBUTRIN SR) 100 MG 12 hr tablet Take 100 mg by mouth 2 (two) times daily.     glipiZIDE (GLUCOTROL) 5 MG tablet Take 10 mg by mouth 2 (two) times daily before a meal.     tamsulosin (FLOMAX) 0.4 MG CAPS capsule TAKE 1 CAPSULE (0.4 MG TOTAL) BY MOUTH DAILY AFTER SUPPER. 90 capsule 3   venlafaxine (EFFEXOR) 37.5 MG tablet Take 75 mg by mouth daily.     oxyCODONE-acetaminophen (PERCOCET) 5-325 MG tablet Take 1 tablet by mouth every 4 (four) hours as needed for severe pain. (Patient not taking: Reported on 03/12/2022) 20 tablet 0   SUMAtriptan (IMITREX) 100 MG tablet TAKE 1 TABLET BY MOUTH ONCE AS NEEDED FOR MIGRAINE. MAY REPEAT 2ND DOSE AFTER 2 HOUR IF NEEDED      Musculoskeletal: Strength & Muscle Tone: within normal limits Gait & Station: normal Patient leans: N/A            Psychiatric Specialty Exam:  Presentation  General Appearance: Appropriate for Environment  Eye Contact:Good  Speech:Clear and Coherent  Speech Volume:Normal  Handedness:No data recorded  Mood and Affect  Mood:Depressed  Affect:Congruent   Thought Process  Thought Processes:Coherent  Descriptions of Associations:Intact  Orientation:Full (Time, Place and Person)  Thought  Content:Logical  History of Schizophrenia/Schizoaffective disorder:No data recorded Duration of Psychotic Symptoms:No data recorded Hallucinations:Hallucinations: None  Ideas of Reference:None  Suicidal Thoughts:Suicidal Thoughts: No  Homicidal Thoughts:Homicidal Thoughts: No   Sensorium  Memory:Immediate Good; Recent Good; Remote Good  Judgment:No data recorded Insight:Good   Executive Functions  Concentration:Good  Attention Span:Good  [place] of Knowledge:Good  Language:Good   Psychomotor Activity  Psychomotor Activity:Psychomotor Activity: Normal   Assets  Assets:Desire for Improvement; Armed forces logistics/support/administrative officer; Financial Resources/Insurance; Housing; Intimacy; Social Support; Resilience; Physical Health   Sleep  Sleep:Sleep: Poor   Physical Exam: Physical Exam Vitals and nursing note reviewed.  HENT:     Head: Normocephalic.     Nose: No congestion or rhinorrhea.  Eyes:     General:        Right eye: No discharge.        Left eye: No discharge.  Pulmonary:     Effort: Pulmonary effort is normal.  Musculoskeletal:        General: Normal range of motion.     Cervical back: Normal range of motion.  Skin:    General: Skin is dry.  Neurological:     Mental Status: He is alert and oriented to person, place, and time.  Psychiatric:        Attention and Perception: Attention normal.        Mood and Affect: Mood is depressed. Affect  is blunt.        Speech: Speech normal.        Behavior: Behavior normal. Behavior is cooperative.        Thought Content: Thought content includes suicidal (Passive) ideation.        Cognition and Memory: Cognition normal.        Judgment: Judgment normal.   Review of Systems  Psychiatric/Behavioral:  Positive for depression and suicidal ideas.   All other systems reviewed and are negative. Blood pressure (!) 163/95, pulse (!) 55, temperature 98.2 F (36.8 C), temperature source Oral, resp. rate 18, height 6'  2" (1.88 m), SpO2 98 %. Body mass index is 28.3 kg/m.  Treatment Plan Summary: Daily contact with patient to assess and evaluate symptoms and progress in treatment, Medication management, and Plan admission to geriatric psychiatry  Disposition: Recommend psychiatric Inpatient admission when medically cleared.  Sherlon Handing, NP 03/12/2022 2:39 PM

## 2022-03-12 NOTE — ED Provider Notes (Signed)
? ?[facility] ?Provider Note ? ? ? Event Date/Time  ? First MD Initiated Contact with Patient 03/12/22 1157   ?  (approximate) ? ? ?History  ? ?Mental Health Problem ? ? ?HPI ? ?Draysen Weygandt is a 81 y.o. male who comes in complaining depression.  He reports he has been depressed for many years but lately symptoms have gotten worse and he has episodes where he is thinking "dark thoughts" and feeling very depressed.  This started after he had successful hernia repair.  He also had some eye pain after that but the eye doctors cleared his eyes and his eyes feel better now. ? ?  ? ? ?Physical Exam  ? ?Triage Vital Signs: ?ED Triage Vitals  ?Enc Vitals Group  ?   BP 03/12/22 1124 (!) 163/95  ?   Pulse Rate 03/12/22 1124 (!) 55  ?   Resp 03/12/22 1124 18  ?   Temp 03/12/22 1124 98.2 ?F (36.8 ?C)  ?   Temp Source 03/12/22 1124 Oral  ?   SpO2 03/12/22 1124 98 %  ?   Weight --   ?   Height 03/12/22 1122 '6\' 2"'$  (1.88 m)  ?   Head Circumference --   ?   Peak Flow --   ?   Pain Score 03/12/22 1122 0  ?   Pain Loc --   ?   Pain Edu? --   ?   Excl. in [place]? --   ? ? ?Most recent vital signs: ?Vitals:  ? 03/12/22 1124  ?BP: (!) 163/95  ?Pulse: (!) 55  ?Resp: 18  ?Temp: 98.2 ?F (36.8 ?C)  ?SpO2: 98%  ? ? ? ?General: Awake, no distress.  ?CV:  Good peripheral perfusion.  Heart regular rate and rhythm no audible murmur ?Resp:  Normal effort.  Lungs are clear ?Abd:  No distention.  Soft and nontender ?Extremities with no edema ? ? ?ED Results / Procedures / Treatments  ? ?Labs ?(all labs ordered are listed, but only abnormal results are displayed) ?Labs Reviewed  ?COMPREHENSIVE METABOLIC PANEL - Abnormal; Notable for the following components:  ?    Result Value  ? Glucose, Bld 123 (*)   ? Total Bilirubin 1.6 (*)   ? All other components within normal limits  ?SALICYLATE LEVEL - Abnormal; Notable for the following components:  ? Salicylate Lvl <6.3 (*)   ? All other components within normal limits  ?ACETAMINOPHEN  LEVEL - Abnormal; Notable for the following components:  ? Acetaminophen (Tylenol), Serum <10 (*)   ? All other components within normal limits  ?CBC - Abnormal; Notable for the following components:  ? Hemoglobin 17.3 (*)   ? All other components within normal limits  ?RESP PANEL BY RT-PCR (FLU A&B, COVID) [id number]  ?ETHANOL  ?URINE DRUG SCREEN, QUALITATIVE ([facility] ONLY)  ? ? ? ?EKG ? ? ? ? ?RADIOLOGY ? ? ? ?PROCEDURES: ?Critical Care performed:  ? ?Procedures ? ? ?MEDICATIONS ORDERED IN ED: ?Medications - No data to display ? ? ?IMPRESSION / MDM / ASSESSMENT AND PLAN / ED COURSE  ?I reviewed the triage vital signs and the nursing notes. ?Psych also came to see the patient feels he should be admitted.  I think this is a good idea. ? ? ? ?  ? ? ?FINAL CLINICAL IMPRESSION(S) / ED DIAGNOSES  ? ?Final diagnoses:  ?Depression, unspecified depression type  ? ? ? ?Rx / DC Orders  ? ?ED Discharge Orders   ? ?  None  ? ?  ? ? ? ?Note:  This document was prepared using Dragon voice recognition software and may include unintentional dictation errors. ?  ?Nena Polio, MD ?03/12/22 1432 ? ?

## 2022-03-12 NOTE — ED Notes (Signed)
VOLUNTARY with recommendation for psych admit per NP Barthold ?

## 2022-03-12 NOTE — BH Assessment (Signed)
[facility] does not have beds available at this time but is expecting discharges on tomm 03/13/22.  ?

## 2022-03-12 NOTE — ED Notes (Addendum)
Dressed out with ED tech Faith  ? ?1 Green T-shirt  ?1 Pair of tan pants ?1 Pair of tennis shoes  ?1 Gray belt  ?1 Navy blue jacket  ?1 Pair of white socks  ?1 Pair of plaid boxers  ?1 Plastic water bottle  ? ?

## 2022-03-13 ENCOUNTER — Encounter: Payer: Self-pay | Admitting: Psychiatry

## 2022-03-13 ENCOUNTER — Inpatient Hospital Stay
Admission: AD | Admit: 2022-03-13 | Discharge: 2022-04-01 | DRG: 881 | Disposition: A | Discharge status: Home / Self Care | Payer: Medicare Other | Source: Intra-hospital | Attending: Psychiatry | Admitting: Psychiatry

## 2022-03-13 ENCOUNTER — Other Ambulatory Visit: Payer: Self-pay

## 2022-03-13 DIAGNOSIS — F329 Major depressive disorder, single episode, unspecified: Principal | ICD-10-CM | POA: Diagnosis present

## 2022-03-13 DIAGNOSIS — Z9151 Personal history of suicidal behavior: Secondary | ICD-10-CM | POA: Diagnosis not present

## 2022-03-13 DIAGNOSIS — E119 Type 2 diabetes mellitus without complications: Secondary | ICD-10-CM | POA: Diagnosis not present

## 2022-03-13 DIAGNOSIS — Z8042 Family history of malignant neoplasm of prostate: Secondary | ICD-10-CM | POA: Diagnosis not present

## 2022-03-13 DIAGNOSIS — I480 Paroxysmal atrial fibrillation: Secondary | ICD-10-CM | POA: Diagnosis not present

## 2022-03-13 DIAGNOSIS — I77819 Aortic ectasia, unspecified site: Secondary | ICD-10-CM | POA: Diagnosis present

## 2022-03-13 DIAGNOSIS — Z87820 Personal history of traumatic brain injury: Secondary | ICD-10-CM

## 2022-03-13 DIAGNOSIS — F322 Major depressive disorder, single episode, severe without psychotic features: Secondary | ICD-10-CM | POA: Diagnosis present

## 2022-03-13 DIAGNOSIS — Z20822 Contact with and (suspected) exposure to covid-19: Secondary | ICD-10-CM | POA: Diagnosis present

## 2022-03-13 DIAGNOSIS — Z7982 Long term (current) use of aspirin: Secondary | ICD-10-CM | POA: Diagnosis not present

## 2022-03-13 DIAGNOSIS — E785 Hyperlipidemia, unspecified: Secondary | ICD-10-CM | POA: Diagnosis not present

## 2022-03-13 DIAGNOSIS — Z833 Family history of diabetes mellitus: Secondary | ICD-10-CM | POA: Diagnosis not present

## 2022-03-13 DIAGNOSIS — F419 Anxiety disorder, unspecified: Secondary | ICD-10-CM | POA: Diagnosis present

## 2022-03-13 DIAGNOSIS — I440 Atrioventricular block, first degree: Secondary | ICD-10-CM | POA: Diagnosis present

## 2022-03-13 DIAGNOSIS — Z8262 Family history of osteoporosis: Secondary | ICD-10-CM | POA: Diagnosis not present

## 2022-03-13 DIAGNOSIS — G4733 Obstructive sleep apnea (adult) (pediatric): Secondary | ICD-10-CM | POA: Diagnosis present

## 2022-03-13 DIAGNOSIS — Z818 Family history of other mental and behavioral disorders: Secondary | ICD-10-CM

## 2022-03-13 DIAGNOSIS — Z8249 Family history of ischemic heart disease and other diseases of the circulatory system: Secondary | ICD-10-CM | POA: Diagnosis not present

## 2022-03-13 DIAGNOSIS — I10 Essential (primary) hypertension: Secondary | ICD-10-CM | POA: Diagnosis present

## 2022-03-13 DIAGNOSIS — Z8546 Personal history of malignant neoplasm of prostate: Secondary | ICD-10-CM | POA: Diagnosis not present

## 2022-03-13 DIAGNOSIS — D751 Secondary polycythemia: Secondary | ICD-10-CM | POA: Diagnosis not present

## 2022-03-13 DIAGNOSIS — E1169 Type 2 diabetes mellitus with other specified complication: Secondary | ICD-10-CM | POA: Diagnosis not present

## 2022-03-13 DIAGNOSIS — F418 Other specified anxiety disorders: Secondary | ICD-10-CM | POA: Diagnosis not present

## 2022-03-13 DIAGNOSIS — Z6828 Body mass index (BMI) 28.0-28.9, adult: Secondary | ICD-10-CM | POA: Diagnosis not present

## 2022-03-13 DIAGNOSIS — F332 Major depressive disorder, recurrent severe without psychotic features: Secondary | ICD-10-CM | POA: Diagnosis not present

## 2022-03-13 DIAGNOSIS — G40909 Epilepsy, unspecified, not intractable, without status epilepticus: Secondary | ICD-10-CM | POA: Diagnosis present

## 2022-03-13 DIAGNOSIS — G902 Horner's syndrome: Secondary | ICD-10-CM | POA: Diagnosis present

## 2022-03-13 DIAGNOSIS — E669 Obesity, unspecified: Secondary | ICD-10-CM | POA: Diagnosis present

## 2022-03-13 DIAGNOSIS — I493 Ventricular premature depolarization: Secondary | ICD-10-CM | POA: Diagnosis present

## 2022-03-13 DIAGNOSIS — Z9049 Acquired absence of other specified parts of digestive tract: Secondary | ICD-10-CM | POA: Diagnosis not present

## 2022-03-13 DIAGNOSIS — I4891 Unspecified atrial fibrillation: Secondary | ICD-10-CM | POA: Diagnosis not present

## 2022-03-13 HISTORY — DX: Traumatic subdural hemorrhage with loss of consciousness status unknown, initial encounter: S06.5XAA

## 2022-03-13 HISTORY — DX: Epidural hemorrhage with loss of consciousness status unknown, initial encounter: S06.4XAA

## 2022-03-13 MED ORDER — LORAZEPAM 1 MG PO TABS
1.0000 mg | ORAL_TABLET | Freq: Four times a day (QID) | ORAL | Status: DC | PRN
  Administered 2022-03-13: 1 mg via ORAL
  Filled 2022-03-13: qty 1

## 2022-03-13 MED ORDER — GLIPIZIDE 5 MG PO TABS
5.0000 mg | ORAL_TABLET | Freq: Two times a day (BID) | ORAL | Status: DC
  Administered 2022-03-14 – 2022-03-25 (×23): 5 mg via ORAL
  Filled 2022-03-13 (×24): qty 1

## 2022-03-13 MED ORDER — MAGNESIUM HYDROXIDE 400 MG/5ML PO SUSP
30.0000 mL | Freq: Every day | ORAL | Status: DC | PRN
  Administered 2022-03-15 – 2022-03-24 (×3): 30 mL via ORAL
  Filled 2022-03-13 (×3): qty 30

## 2022-03-13 MED ORDER — ACETAMINOPHEN 325 MG PO TABS: 650.0000 mg | ORAL_TABLET | Freq: Four times a day (QID) | ORAL | Status: DC | PRN

## 2022-03-13 MED ORDER — VENLAFAXINE HCL 37.5 MG PO TABS
37.5000 mg | ORAL_TABLET | Freq: Every day | ORAL | Status: DC
Start: 2022-03-13 — End: 2022-03-14
  Administered 2022-03-13: 37.5 mg via ORAL
  Filled 2022-03-13: qty 1

## 2022-03-13 MED ORDER — ALUM & MAG HYDROXIDE-SIMETH 200-200-20 MG/5ML PO SUSP
30.0000 mL | ORAL | Status: DC | PRN
Start: 2022-03-13 — End: 2022-04-01

## 2022-03-13 MED ORDER — HYDROXYZINE HCL 25 MG PO TABS
25.0000 mg | ORAL_TABLET | Freq: Once | ORAL | Status: AC
Start: 2022-03-13 — End: 2022-03-13
  Administered 2022-03-13: 25 mg via ORAL
  Filled 2022-03-13: qty 1

## 2022-03-13 MED ORDER — ATENOLOL 25 MG PO TABS
50.0000 mg | ORAL_TABLET | Freq: Every day | ORAL | Status: DC
  Administered 2022-03-13: 50 mg via ORAL
  Filled 2022-03-13: qty 2

## 2022-03-13 MED ORDER — ATENOLOL 50 MG PO TABS
50.0000 mg | ORAL_TABLET | Freq: Every day | ORAL | Status: DC
Start: 2022-03-14 — End: 2022-03-24
  Administered 2022-03-14 – 2022-03-24 (×11): 50 mg via ORAL
  Filled 2022-03-13 (×11): qty 1

## 2022-03-13 MED ORDER — TAMSULOSIN HCL 0.4 MG PO CAPS
0.4000 mg | ORAL_CAPSULE | Freq: Every day | ORAL | Status: DC
  Administered 2022-03-14 – 2022-03-31 (×18): 0.4 mg via ORAL
  Filled 2022-03-13 (×18): qty 1

## 2022-03-13 MED ORDER — LORAZEPAM 1 MG PO TABS
1.0000 mg | ORAL_TABLET | Freq: Four times a day (QID) | ORAL | Status: DC | PRN
  Administered 2022-03-14 – 2022-03-18 (×5): 1 mg via ORAL
  Filled 2022-03-13 (×5): qty 1

## 2022-03-13 NOTE — Progress Notes (Signed)
Patient admitted voluntary to [facility] from [facility] ED with diagnosis of depression. Patient presents to unit ambulatory and A&Ox4. Patient states, I have had depression and anxiety since my hernia repair surgery. He states his vision went bad after surgery and he has been anxious since. Patient's affect is sad and sullen, thoughts are organized. Patient endorses depression and anxiety stating his main stressors are his health. Patient currently denies suicidal ideations, homicidal ideations, audio or visual hallucinations and verbally contracts for safety on unit. Patient states appetite is fine and he has lost no weight that he is aware of. Denies incontinence and reports last BM yesterday. Patient denies smoking or drug abuse, also denies drinking alcohol.Patient reports living home with his wife and has 2 adult children. When asked what his strengths are or the goals he would like to work on while here pt states, "I want to get my anxiety and depression under control."   ? ?Emotional support and reassurance provided throughout admission process. Afterwards, oriented patient to unit, room and call light, reviewed POC with all questions answered and understanding verbalzied. Denies any needs at this time. Will continue to monitor with ongoing Q 15 minute safety checks per unit protocol. ?

## 2022-03-13 NOTE — ED Notes (Signed)
Lunch tray and drink given.  

## 2022-03-13 NOTE — ED Notes (Signed)
Pt taking shower, provided with change of scrubs and shower supplies. Bed linen changed. ?

## 2022-03-13 NOTE — Progress Notes (Signed)
Pt states that he takes Flomax to help with urination. Pt states he has not taken it since coming to hospital. Says he usually takes it in the evening. Provider notified. Pt states that he does not want to be woken up if the medication arrives after he has fallen asleep. ?

## 2022-03-13 NOTE — BH Assessment (Signed)
Patient to be reviewed with [place] during day shift 03/13/22 ?

## 2022-03-13 NOTE — ED Notes (Signed)
Pt states he take atenolol for his bp but has not had it today. ?

## 2022-03-13 NOTE — ED Notes (Signed)
VOL/Pending psych Admit to [facility] unit/ referrals faxed ?

## 2022-03-13 NOTE — ED Notes (Signed)
Pt provided with urinal.  Pt is alert and walking w/steady gait.   ?

## 2022-03-13 NOTE — Tx Team (Signed)
Initial Treatment Plan ?03/13/2022 ?4:24 PM ?[name] ?LTY:[id number] ? ? ? ?PATIENT STRESSORS: ?Other: vision issues after surgery.   ? ? ?PATIENT STRENGTHS: ?Work skills  ? ? ?PATIENT IDENTIFIED PROBLEMS: ?Anxiety and vision issues  ?  ?  ?  ?  ?  ?  ?  ?  ?  ? ?DISCHARGE CRITERIA:  ?Ability to meet basic life and health needs ?Improved stabilization in mood, thinking, and/or behavior ? ?PRELIMINARY DISCHARGE PLAN: ?Return to previous living arrangement ? ?PATIENT/FAMILY INVOLVEMENT: ?This treatment plan has been presented to and reviewed with the patient, John [name].  The patient has been given the opportunity to ask questions and make suggestions. ? ?Nolon Bussing, RN ?03/13/2022, 4:24 PM ?

## 2022-03-13 NOTE — Plan of Care (Signed)
?  Problem: Education: ?Goal: Utilization of techniques to improve thought processes will improve ?Outcome: Not Progressing ?Goal: Knowledge of the prescribed therapeutic regimen will improve ?Outcome: Not Progressing ?  ?Problem: Activity: ?Goal: Interest or engagement in leisure activities will improve ?Outcome: Not Progressing ?Goal: Imbalance in normal sleep/wake cycle will improve ?Outcome: Not Progressing ?  ?Problem: Coping: ?Goal: Coping ability will improve ?Outcome: Not Progressing ?Goal: Will verbalize feelings ?Outcome: Not Progressing ?  ?Problem: Health Behavior/Discharge Planning: ?Goal: Ability to make decisions will improve ?Outcome: Not Progressing ?Goal: Compliance with therapeutic regimen will improve ?Outcome: Not Progressing ?  ?Problem: Role Relationship: ?Goal: Will demonstrate positive changes in social behaviors and relationships ?Outcome: Not Progressing ?  ?Problem: Safety: ?Goal: Ability to disclose and discuss suicidal ideas will improve ?Outcome: Not Progressing ?Goal: Ability to identify and utilize support systems that promote safety will improve ?Outcome: Not Progressing ?  ?Problem: Self-Concept: ?Goal: Will verbalize positive feelings about self ?Outcome: Not Progressing ?Goal: Level of anxiety will decrease ?Outcome: Not Progressing ?  ?Problem: Education: ?Goal: Knowledge of [facility] Education information/materials will improve ?Outcome: Not Progressing ?Goal: Emotional status will improve ?Outcome: Not Progressing ?Goal: Mental status will improve ?Outcome: Not Progressing ?Goal: Verbalization of understanding the information provided will improve ?Outcome: Not Progressing ?  ?Problem: Activity: ?Goal: Interest or engagement in activities will improve ?Outcome: Not Progressing ?Goal: Sleeping patterns will improve ?Outcome: Not Progressing ?  ?Problem: Coping: ?Goal: Ability to verbalize frustrations and anger appropriately will improve ?Outcome: Not  Progressing ?Goal: Ability to demonstrate self-control will improve ?Outcome: Not Progressing ?  ?Problem: Health Behavior/Discharge Planning: ?Goal: Identification of resources available to assist in meeting health care needs will improve ?Outcome: Not Progressing ?Goal: Compliance with treatment plan for underlying cause of condition will improve ?Outcome: Not Progressing ?  ?Problem: Physical Regulation: ?Goal: Ability to maintain clinical measurements within normal limits will improve ?Outcome: Not Progressing ?  ?Problem: Safety: ?Goal: Periods of time without injury will increase ?Outcome: Not Progressing ?  ?

## 2022-03-13 NOTE — Progress Notes (Addendum)
   03/13/22 2050  Psych Admission Type (Psych Patients Only)  Admission Status Voluntary  Psychosocial Assessment  Patient Complaints Anxiety  Eye Contact Fair  Facial Expression Anxious  Affect Anxious  Speech Logical/coherent  Interaction Assertive  Motor Activity Slow  Appearance/Hygiene In scrubs;Unremarkable  Behavior Characteristics Cooperative;Appropriate to situation;Anxious  Mood Anxious;Pleasant  Thought Process  Coherency WDL  Content WDL  Delusions None reported or observed  Perception WDL  Hallucination None reported or observed  Judgment WDL  Confusion None  Danger to Self  Current suicidal ideation? Denies  Danger to Others  Danger to Others None reported or observed   Pt seen in the dayroom. Pt denies SI, HI, AVH and pain. Pt rates anxiety 8/10 and denies depression. Pt states that he has been growing more anxious for a while and felt that it was a good idea to come to the emergency room. "I've had depression before and several hospitalizations. This time the anxiety was high and I was pacing around the house and my wife was ignoring me. I thought it best to come to get checked out." Pt said he had vision changes after his surgery in March but states they have since resolved. Says he is eating and sleeping okay except for the occasional wakeful night. Pt says his dose of Effexor was recently doubled and he still doesn't feel like it has had a great effect on his mood. Pt would consider a medication change if it would help. Pt says he had a waking dream this morning after receiving anxiety medication in the ED. Pt said it made him sleep as well. Pt wants to feel better and return to his baseline.  Pt given scheduled medication as prescribed. Q15 minute checks for safety.  Pt safe on unit.

## 2022-03-13 NOTE — BH Assessment (Signed)
Patient is to be admitted to [facility] today 03/13/22 by Dr.  Louis Meckel .  ?Attending Physician will be Dr.  Louis Meckel .   ?Patient has been assigned to room 34, by [place], Loree Fee.   ? ?ER staff is aware of the admission: ?Apolonio Schneiders, ER Secretary   ?Dr. Quentin Cornwall, ER MD  ?Rolan Lipa, Patient's Nurse  ?Seth Bake, Patient Access.  ?

## 2022-03-13 NOTE — ED Notes (Signed)
Pt expressing feelings of anxiety at this time.  Pt sitting on side of bed, appearing anxious.  MD Alfred Levins to place order for meds.   ?

## 2022-03-13 NOTE — ED Notes (Signed)
Pt up to the restroom, no distress noted,  no difficulies walking. ?

## 2022-03-14 DIAGNOSIS — F332 Major depressive disorder, recurrent severe without psychotic features: Secondary | ICD-10-CM

## 2022-03-14 MED ORDER — RISPERIDONE 1 MG PO TABS
0.5000 mg | ORAL_TABLET | ORAL | Status: DC
  Administered 2022-03-14 – 2022-03-21 (×15): 0.5 mg via ORAL
  Filled 2022-03-14 (×15): qty 1

## 2022-03-14 MED ORDER — VENLAFAXINE HCL ER 75 MG PO CP24
75.0000 mg | ORAL_CAPSULE | Freq: Every day | ORAL | Status: DC
  Administered 2022-03-14 – 2022-03-15 (×2): 75 mg via ORAL
  Filled 2022-03-14 (×2): qty 1

## 2022-03-14 MED ORDER — DOXEPIN HCL 25 MG PO CAPS
25.0000 mg | ORAL_CAPSULE | Freq: Every day | ORAL | Status: DC
  Administered 2022-03-14 – 2022-03-15 (×2): 25 mg via ORAL
  Filled 2022-03-14 (×2): qty 1

## 2022-03-14 MED ORDER — LORAZEPAM 1 MG PO TABS
1.0000 mg | ORAL_TABLET | Freq: Once | ORAL | Status: AC
  Administered 2022-03-14: 1 mg via ORAL
  Filled 2022-03-14: qty 1

## 2022-03-14 NOTE — Progress Notes (Signed)
Recreation Therapy Notes  INPATIENT RECREATION TR PLAN  Patient Details Name: John [name] MRN: [id number] DOB: 1941/09/08 Today's Date: 03/14/2022  Rec Therapy Plan Is patient appropriate for Therapeutic Recreation?: Yes Treatment times per week: at least 3 Estimated Length of Stay: 5-7 days TR Treatment/Interventions: Group participation (Comment)  Discharge Criteria Pt will be discharged from therapy if:: Discharged Treatment plan/goals/alternatives discussed and agreed upon by:: Patient/family  Discharge Summary     Mliss Wedin 03/14/2022, 3:06 PM

## 2022-03-14 NOTE — Progress Notes (Signed)
Recreation Therapy Notes  Date: 03/14/2022   Time: 2:30pm     Location: Craft room    Behavioral response: N/A   Intervention Topic: Values    Discussion/Intervention: Patient refused to attend group.    Clinical Observations/Feedback:  Patient refused to attend group.    Dalvin Clipper LRT/CTRS        Arvilla Salada 03/14/2022 2:39 PM

## 2022-03-14 NOTE — Plan of Care (Signed)
Patient is alert, oriented and restless. Calm and cooperative during assessment. Verbalize depression of 7/10 anxiety of 8/10. Denies SI, HI, and AVH. Ate meals in the day room among peers with good appetite. Compliant with all due medication. Remain safe on the unit with Q15 minute safety check.  Problem: Education: Goal: Utilization of techniques to improve thought processes will improve Outcome: Progressing Goal: Knowledge of the prescribed therapeutic regimen will improve Outcome: Progressing   Problem: Activity: Goal: Interest or engagement in leisure activities will improve Outcome: Progressing Goal: Imbalance in normal sleep/wake cycle will improve Outcome: Progressing   Problem: Coping: Goal: Coping ability will improve Outcome: Progressing Goal: Will verbalize feelings Outcome: Progressing   Problem: Health Behavior/Discharge Planning: Goal: Ability to make decisions will improve Outcome: Progressing Goal: Compliance with therapeutic regimen will improve Outcome: Progressing   Problem: Role Relationship: Goal: Will demonstrate positive changes in social behaviors and relationships Outcome: Progressing   Problem: Safety: Goal: Ability to disclose and discuss suicidal ideas will improve Outcome: Progressing Goal: Ability to identify and utilize support systems that promote safety will improve Outcome: Progressing   Problem: Self-Concept: Goal: Will verbalize positive feelings about self Outcome: Progressing Goal: Level of anxiety will decrease Outcome: Progressing   Problem: Education: Goal: Knowledge of [facility] Education information/materials will improve Outcome: Progressing Goal: Emotional status will improve Outcome: Progressing Goal: Mental status will improve Outcome: Progressing Goal: Verbalization of understanding the information provided will improve Outcome: Progressing   Problem: Activity: Goal: Interest or engagement in activities  will improve Outcome: Progressing Goal: Sleeping patterns will improve Outcome: Progressing   Problem: Coping: Goal: Ability to verbalize frustrations and anger appropriately will improve Outcome: Progressing Goal: Ability to demonstrate self-control will improve Outcome: Progressing   Problem: Health Behavior/Discharge Planning: Goal: Identification of resources available to assist in meeting health care needs will improve Outcome: Progressing Goal: Compliance with treatment plan for underlying cause of condition will improve Outcome: Progressing   Problem: Physical Regulation: Goal: Ability to maintain clinical measurements within normal limits will improve Outcome: Progressing   Problem: Safety: Goal: Periods of time without injury will increase Outcome: Progressing
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
## 2022-03-14 NOTE — H&P (Signed)
Psychiatric Admission Assessment Adult  Patient Identification: John [name] MRN:  [id number] Date of Evaluation:  03/14/2022 Chief Complaint:  MDD (major depressive disorder) [F32.9] Principal Diagnosis: MDD (major depressive disorder) Diagnosis:  Principal Problem:   MDD (major depressive disorder)  History of Present Illness:  John [name] is a 81 year old white male with a history of depression and anxiety. John [name] is admitted voluntarily under routine orders and precautions. He has been treated for depression since he was a teenager.  He has been on Elavil and Prozac in the past.  Recently, he has been on Effexor for the past 15 years.  He had surgery about 3 months ago for hernia and after anesthesia his eyes were very irritated and this seemed to have upset him and he never recovered from his anxiety and depression after the surgery.  He endorses anhedonia, difficulty sleeping, depressed mood, extreme anxiety and hopelessness.  He denies suicidal ideation.  He has had 1 suicide attempt in the past by suffocation.  He has seen a psychiatrist off and on for 4 most of his life.  He states that he sees mostly therapist.  He is originally from [place] but then moved to [place] to be closer to his daughter then eventually moved to [place].  He also has a son who lives in [place].  He is married and states that his relationship with his family are good.  He also endorses racing thoughts.  PER INITIAL INTAKE: Patient has long history of anxiety and depression, which he identifies as stemming from a head injury at 81 years of age.  He has been functioning very well throughout his life.  He is a retired Chief Financial Officer.  He has been on a low-dose of venlafaxine for many years.  He has had a few hospitalizations, the last being in the 1990s.  The only suicide attempt that he describes is putting a bag over his head many years ago, but then he took it off.  He sees his primary care physician for medication  management and has had a therapist for many years and knew "spiritual director" for the last year and a half, with whom he has a good relationship.   Patient presented voluntarily from home after worsening depression and anxiety.  Patient states that he had hernia surgery about 2 months ago and then started developing problems with his eyes.  He has seen his ophthalmologist and that problem has cleared up, but patient continues to have a lot of anxiety and depression, many times feeling overwhelmed and that life is not worth living.  He identifies his wife, family, and close friends as of great help to him.  He feels better after he talks at them, but that only lasts for a short time.  Patient does feel that he is often occupied with physical symptoms and worries a lot about that.  Patient says he came to the hospital because he believes that he needs some kind of adjustment in medication.  He has started thinking about looking for ways that he could kill himself. Patient is very pleasant, articulate.  He speaks in linear sentences.  He has forward thinking thoughts.  Describes poor sleep and fair appetite.  Associated Signs/Symptoms: Depression Symptoms:  depressed mood, anhedonia, difficulty concentrating, anxiety, Duration of Depression Symptoms: Greater than two weeks  (Hypo) Manic Symptoms:  No Anxiety Symptoms:  Excessive Worry, Panic Symptoms, Social Anxiety, Psychotic Symptoms:  No PTSD Symptoms: NA Total Time spent with patient: 1 hour  Past Psychiatric History:  He describes numerous psychiatric hospitalizations for depression and anxiety between 1978 and 1990.  Is the patient at risk to self? Yes.    Has the patient been a risk to self in the past 6 months? Yes.    Has the patient been a risk to self within the distant past? Yes.    Is the patient a risk to others? No.  Has the patient been a risk to others in the past 6 months? No.  Has the patient been a risk to others within  the distant past? No.   Prior Inpatient Therapy: Yes Prior Outpatient Therapy: Yes  Alcohol Screening: 1. How often do you have a drink containing alcohol?: Never 2. How many drinks containing alcohol do you have on a typical day when you are drinking?: 1 or 2 3. How often do you have six or more drinks on one occasion?: Never AUDIT-C Score: 0 4. How often during the last year have you found that you were not able to stop drinking once you had started?: Never 5. How often during the last year have you failed to do what was normally expected from you because of drinking?: Never 6. How often during the last year have you needed a first drink in the morning to get yourself going after a heavy drinking session?: Never 7. How often during the last year have you had a feeling of guilt of remorse after drinking?: Never 8. How often during the last year have you been unable to remember what happened the night before because you had been drinking?: Never 9. Have you or someone else been injured as a result of your drinking?: No 10. Has a relative or friend or a doctor or another health worker been concerned about your drinking or suggested you cut down?: No Alcohol Use Disorder Identification Test Final Score (AUDIT): 0 Substance Abuse History in the last 12 months:  No. 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   Eczema    Elevated PSA    H/O Osgood-Schlatter disease    Hand deformity, congenital    Hearing loss bilateral   Hemorrhoids    Horner syndrome    Hypertension    Migraine    Obesity    PONV (postoperative nausea and vomiting)    Prostate cancer ([place]) 2020   Rosacea    Sleep apnea     Past Surgical  History:  Procedure Laterality Date   ANKLE ARTHROSCOPY Left    fracture   CHOLECYSTECTOMY  2007   CRANIOTOMY  1958   left frontal craniotomy for epidural hematoma   INSERTION OF MESH  01/02/2022   Procedure: INSERTION OF MESH;  Surgeon: Herbert Pun, MD;  Location: [facility] ORS;  Service: General;;   LIGAMENT REPAIR Right    thumb   RETINAL DETACHMENT SURGERY     TREATMENT FISTULA ANAL  2006   Family History:  Family History  Problem Relation Age of Onset   Osteoporosis Mother    Depression Mother    Prostate cancer Father    Hypertension Father    Diabetes Brother    Kidney disease Neg Hx    Family Psychiatric  History: Unremarkable Tobacco Screening:   Social  History:  Social History   Substance and Sexual Activity  Alcohol Use Not Currently     Social History   Substance and Sexual Activity  Drug Use No    Additional Social History:   He is currently living in [place] with his wife.  He has a daughter and a son-in-law and 2 grandchildren also in [place].                      Allergies:  No Known Allergies Lab Results:  Results for orders placed or performed during the hospital encounter of 03/12/22 (from the past 48 hour(s))  Comprehensive metabolic panel     Status: Abnormal   Collection Time: 03/12/22 11:36 AM  Result Value Ref Range   Sodium 139 135 - 145 mmol/L   Potassium 4.1 3.5 - 5.1 mmol/L   Chloride 106 98 - 111 mmol/L   CO2 26 22 - 32 mmol/L   Glucose, Bld 123 (H) 70 - 99 mg/dL    Comment: Glucose reference range applies only to samples taken after fasting for at least 8 hours.   BUN 21 8 - 23 mg/dL   Creatinine, Ser 0.86 0.61 - 1.24 mg/dL   Calcium 9.4 8.9 - 10.3 mg/dL   Total Protein 7.1 6.5 - 8.1 g/dL   Albumin 4.2 3.5 - 5.0 g/dL   AST 30 15 - 41 U/L   ALT 29 0 - 44 U/L   Alkaline Phosphatase 57 38 - 126 U/L   Total Bilirubin 1.6 (H) 0.3 - 1.2 mg/dL   GFR, Estimated >60 >60 mL/min    Comment: (NOTE) Calculated using  the CKD-EPI Creatinine Equation (2021)    Anion gap 7 5 - 15    Comment: Performed at [facility], [address] [place], [place] [address]  Ethanol     Status: None   Collection Time: 03/12/22 11:36 AM  Result Value Ref Range   Alcohol, Ethyl (B) <10 <10 mg/dL    Comment: (NOTE) Lowest detectable limit for serum alcohol is 10 mg/dL.  For medical purposes only. Performed at [facility], [address] [place], [place] [address]   Salicylate level     Status: Abnormal   Collection Time: 03/12/22 11:36 AM  Result Value Ref Range   Salicylate Lvl <2.5 (L) 7.0 - 30.0 mg/dL    Comment: Performed at [facility], [place], [place] [address]  Acetaminophen level     Status: Abnormal   Collection Time: 03/12/22 11:36 AM  Result Value Ref Range   Acetaminophen (Tylenol), Serum <10 (L) 10 - 30 ug/mL    Comment: (NOTE) Therapeutic concentrations vary significantly. A range of 10-30 ug/mL  may be an effective concentration for many patients. However, some  are best treated at concentrations outside of this range. Acetaminophen concentrations >150 ug/mL at 4 hours after ingestion  and >50 ug/mL at 12 hours after ingestion are often associated with  toxic reactions.  Performed at [facility], [address] [place],  [address]   cbc     Status: Abnormal   Collection Time: 03/12/22 11:36 AM  Result Value Ref Range   WBC 6.6 4.0 - 10.5 K/uL   RBC 5.42 4.22 - 5.81 MIL/uL   Hemoglobin 17.3 (H) 13.0 - 17.0 g/dL   HCT 52.0 39.0 - 52.0 %   MCV 95.9 80.0 - 100.0 fL   MCH 31.9 26.0 - 34.0 pg   MCHC 33.3 30.0 -  36.0 g/dL   RDW 12.9 11.5 - 15.5 %   Platelets 199 150 - 400 K/uL   nRBC 0.0 0.0 - 0.2 %    Comment: Performed at [facility], [address] [place], [place] [address]  Urine Drug Screen, Qualitative     Status: None   Collection Time: 03/12/22  1:19 PM  Result Value Ref Range   Tricyclic, Ur Screen  NONE DETECTED NONE DETECTED   Amphetamines, Ur Screen NONE DETECTED NONE DETECTED   MDMA (Ecstasy)Ur Screen NONE DETECTED NONE DETECTED   Cocaine Metabolite,Ur [place] NONE DETECTED NONE DETECTED   Opiate, Ur Screen NONE DETECTED NONE DETECTED   Phencyclidine (PCP) Ur S NONE DETECTED NONE DETECTED   Cannabinoid 50 Ng, Ur  NONE DETECTED NONE DETECTED   Barbiturates, Ur Screen NONE DETECTED NONE DETECTED   Benzodiazepine, Ur Scrn NONE DETECTED NONE DETECTED   Methadone Scn, Ur NONE DETECTED NONE DETECTED    Comment: (NOTE) Tricyclics + metabolites, urine    Cutoff 1000 ng/mL Amphetamines + metabolites, urine  Cutoff 1000 ng/mL MDMA (Ecstasy), urine              Cutoff 500 ng/mL Cocaine Metabolite, urine          Cutoff 300 ng/mL Opiate + metabolites, urine        Cutoff 300 ng/mL Phencyclidine (PCP), urine         Cutoff 25 ng/mL Cannabinoid, urine                 Cutoff 50 ng/mL Barbiturates + metabolites, urine  Cutoff 200 ng/mL Benzodiazepine, urine              Cutoff 200 ng/mL Methadone, urine                   Cutoff 300 ng/mL  The urine drug screen provides only a preliminary, unconfirmed analytical test result and should not be used for non-medical purposes. Clinical consideration and professional judgment should be applied to any positive drug screen result due to possible interfering substances. A more specific alternate chemical method must be used in order to obtain a confirmed analytical result. Gas chromatography / mass spectrometry (GC/MS) is the preferred confirm atory method. Performed at [facility], [address] [place], [place] [address]   Resp Panel by RT-PCR (Flu A&B, Covid) Nasopharyngeal Swab     Status: None   Collection Time: 03/12/22  1:19 PM   Specimen: Nasopharyngeal Swab; Nasopharyngeal(NP) swabs in vial transport medium  Result Value Ref Range   SARS Coronavirus 2 by RT PCR NEGATIVE NEGATIVE    Comment: (NOTE) SARS-CoV-2 target nucleic  acids are NOT DETECTED.  The SARS-CoV-2 RNA is generally detectable in upper respiratory specimens during the acute phase of infection. The lowest concentration of SARS-CoV-2 viral copies this assay can detect is 138 copies/mL. A negative result does not preclude SARS-Cov-2 infection and should not be used as the sole basis for treatment or other patient management decisions. A negative result may occur with  improper specimen collection/handling, submission of specimen other than nasopharyngeal swab, presence of viral mutation(s) within the areas targeted by this assay, and inadequate number of viral copies(<138 copies/mL). A negative result must be combined with clinical observations, patient history, and epidemiological information. The expected result is Negative.  Fact Sheet for Patients:  EntrepreneurPulse.com.au  Fact Sheet for Healthcare Providers:  IncredibleEmployment.be  This test is no t yet approved or cleared by the [place] and  has been authorized for detection and/or diagnosis of SARS-CoV-2 by FDA under an Emergency Use Authorization (EUA). This EUA will remain  in effect (meaning this test can be used) for the duration of the COVID-19 declaration under Section 564(b)(1) of the Act, 21 U.S.C.section 360bbb-3(b)(1), unless the authorization is terminated  or revoked sooner.       Influenza A by PCR NEGATIVE NEGATIVE   Influenza B by PCR NEGATIVE NEGATIVE    Comment: (NOTE) The Xpert Xpress SARS-CoV-2/FLU/RSV plus assay is intended as an aid in the diagnosis of influenza from Nasopharyngeal swab specimens and should not be used as a sole basis for treatment. Nasal washings and aspirates are unacceptable for Xpert Xpress SARS-CoV-2/FLU/RSV testing.  Fact Sheet for Patients: EntrepreneurPulse.com.au  Fact Sheet for Healthcare Providers: IncredibleEmployment.be  This test is not  yet approved or cleared by the [place] FDA and has been authorized for detection and/or diagnosis of SARS-CoV-2 by FDA under an Emergency Use Authorization (EUA). This EUA will remain in effect (meaning this test can be used) for the duration of the COVID-19 declaration under Section 564(b)(1) of the Act, 21 U.S.C. section 360bbb-3(b)(1), unless the authorization is terminated or revoked.  Performed at [facility], [address] [place], [place] [address]     Blood Alcohol level:  Lab Results  Component Value Date   [facility] <10 60/45/4098    Metabolic Disorder Labs:  No results found for: HGBA1C, MPG No results found for: PROLACTIN No results found for: CHOL, TRIG, HDL, CHOLHDL, VLDL, LDLCALC  Current Medications: Current Facility-Administered Medications  Medication Dose Route Frequency Provider Last Rate Last Admin   acetaminophen (TYLENOL) tablet 650 mg  650 mg Oral Q6H PRN Sherlon Handing, NP       alum & mag hydroxide-simeth (MAALOX/MYLANTA) 200-200-20 MG/5ML suspension 30 mL  30 mL Oral Q4H PRN Sherlon Handing, NP       atenolol (TENORMIN) tablet 50 mg  50 mg Oral Daily Waldon Merl F, NP   50 mg at 03/14/22 0901   glipiZIDE (GLUCOTROL) tablet 5 mg  5 mg Oral BID Caroline Sauger, NP   5 mg at 03/14/22 0835   LORazepam (ATIVAN) tablet 1 mg  1 mg Oral Q6H PRN Sherlon Handing, NP       magnesium hydroxide (MILK OF MAGNESIA) suspension 30 mL  30 mL Oral Daily PRN Waldon Merl F, NP       tamsulosin (FLOMAX) capsule 0.4 mg  0.4 mg Oral Q2200 Caroline Sauger, NP       venlafaxine [facility]) tablet 37.5 mg  37.5 mg Oral QHS Waldon Merl F, NP   37.5 mg at 03/13/22 2111   PTA Medications: Facility-Administered Medications Prior to Admission  Medication Dose Route Frequency Provider Last Rate Last Admin   gentamicin (GARAMYCIN) injection 80 mg  80 mg Intramuscular Once Hollice Espy, MD       levofloxacin [facility] tablet 500 mg   500 mg Oral Once Hollice Espy, MD       Medications Prior to Admission  Medication Sig Dispense Refill Last Dose   aspirin EC 81 MG tablet Take 81 mg by mouth daily.      atenolol (TENORMIN) 50 MG tablet Take 50 mg by mouth daily.  0    buPROPion ER (WELLBUTRIN SR) 100 MG 12 hr tablet Take 100 mg by mouth 2 (two) times daily.      glipiZIDE (GLUCOTROL) 5 MG tablet Take 10 mg by mouth 2 (two) times  daily before a meal.      oxyCODONE-acetaminophen (PERCOCET) 5-325 MG tablet Take 1 tablet by mouth every 4 (four) hours as needed for severe pain. (Patient not taking: Reported on 03/12/2022) 20 tablet 0    SUMAtriptan (IMITREX) 100 MG tablet TAKE 1 TABLET BY MOUTH ONCE AS NEEDED FOR MIGRAINE. MAY REPEAT 2ND DOSE AFTER 2 HOUR IF NEEDED      tamsulosin (FLOMAX) 0.4 MG CAPS capsule TAKE 1 CAPSULE (0.4 MG TOTAL) BY MOUTH DAILY AFTER SUPPER. 90 capsule 3    venlafaxine (EFFEXOR) 37.5 MG tablet Take 75 mg by mouth daily.       Musculoskeletal: Strength & Muscle Tone: within normal limits Gait & Station: normal Patient leans: N/A            Psychiatric Specialty Exam:  Presentation  General Appearance: Appropriate for Environment  Eye Contact:Good  Speech:Clear and Coherent  Speech Volume:Normal  Handedness:No data recorded  Mood and Affect  Mood:Depressed  Affect:Congruent   Thought Process  Thought Processes:Coherent  Duration of Psychotic Symptoms: No data recorded Past Diagnosis of Schizophrenia or Psychoactive disorder: No  Descriptions of Associations:Intact  Orientation:Full (Time, Place and Person)  Thought Content:Logical  Hallucinations:No data recorded Ideas of Reference:None  Suicidal Thoughts:No data recorded Homicidal Thoughts:No data recorded  Sensorium  Memory:Immediate Good; Recent Good; Remote Good  Judgment:No data recorded Insight:Good   Executive Functions  Concentration:Good  Attention Span:Good  [place]  Language:Good   Psychomotor Activity  Psychomotor Activity:No data recorded  Assets  Assets:Desire for Improvement; Armed forces logistics/support/administrative officer; Financial Resources/Insurance; Housing; Intimacy; Social Support; Resilience; Physical Health   Sleep  Sleep:No data recorded   Physical Exam: Physical Exam Vitals and nursing note reviewed.  Constitutional:      Appearance: Normal appearance. He is normal weight.  HENT:     Head: Normocephalic and atraumatic.     Nose: Nose normal.     Mouth/Throat:     Pharynx: Oropharynx is clear.  Eyes:     Extraocular Movements: Extraocular movements intact.     Pupils: Pupils are equal, round, and reactive to light.  Cardiovascular:     Rate and Rhythm: Normal rate and regular rhythm.     Pulses: Normal pulses.     Heart sounds: Normal heart sounds.  Pulmonary:     Effort: Pulmonary effort is normal.     Breath sounds: Normal breath sounds.  Abdominal:     General: Abdomen is flat. Bowel sounds are normal.     Palpations: Abdomen is soft.  Musculoskeletal:        General: Normal range of motion.     Cervical back: Normal range of motion and neck supple.  Skin:    General: Skin is warm and dry.  Neurological:     General: No focal deficit present.     Mental Status: He is alert and oriented to person, place, and time.  Psychiatric:        Attention and Perception: Attention and perception normal.        Mood and Affect: Mood is depressed. Affect is flat.        Speech: Speech normal.        Behavior: Behavior normal. Behavior is cooperative.        Thought Content: Thought content is paranoid.        Cognition and Memory: Cognition and memory normal.        Judgment: Judgment normal.   Review of Systems  Constitutional:  Negative.   HENT: Negative.    Eyes: Negative.   Respiratory: Negative.    Cardiovascular: Negative.   Gastrointestinal: Negative.   Genitourinary: Negative.   Musculoskeletal: Negative.   Skin:  Negative.   Neurological: Negative.   Endo/Heme/Allergies: Negative.   Psychiatric/Behavioral:  Positive for depression. The patient is nervous/anxious and has insomnia.   Blood pressure 125/87, pulse 64, temperature (!) 97.5 F (36.4 C), temperature source Oral, resp. rate 20, SpO2 100 %. There is no height or weight on file to calculate BMI.  Treatment Plan Summary: Daily contact with patient to assess and evaluate symptoms and progress in treatment, Medication management, and Plan see orders  Observation Level/Precautions:  15 minute checks  Laboratory:  CBC Chemistry Profile HbAIC  Psychotherapy:    Medications:    Consultations:    Discharge Concerns:    Estimated LOS:  Other:     Physician Treatment Plan for Primary Diagnosis: MDD (major depressive disorder) Long Term Goal(s): Improvement in symptoms so as ready for discharge  Short Term Goals: Ability to identify changes in lifestyle to reduce recurrence of condition will improve, Ability to verbalize feelings will improve, Ability to disclose and discuss suicidal ideas, Ability to demonstrate self-control will improve, Ability to identify and develop effective coping behaviors will improve, Ability to maintain clinical measurements within normal limits will improve, Compliance with prescribed medications will improve, and Ability to identify triggers associated with substance abuse/mental health issues will improve  Physician Treatment Plan for Secondary Diagnosis: Principal Problem:   MDD (major depressive disorder)   I certify that inpatient services furnished can reasonably be expected to improve the patient's condition.    Parks Ranger, DO 5/18/202310:17 AM

## 2022-03-14 NOTE — Progress Notes (Signed)
Recreation Therapy Notes  Patient phone returned to wife. Patient aware and agreed to giving phone to wife while currently being hospitalized.        John [name] 03/14/2022 9:14 AM

## 2022-03-14 NOTE — BHH Suicide Risk Assessment (Signed)
[facility] Admission Suicide Risk Assessment   Nursing information obtained from:  Patient Demographic factors:  Male, Age 81 or older Current Mental Status:  NA Loss Factors:  Decline in physical health Historical Factors:  NA Risk Reduction Factors:  Living with another person, especially a relative  Total Time spent with patient: 1 hour Principal Problem: MDD (major depressive disorder) Diagnosis:  Principal Problem:   MDD (major depressive disorder)  Subjective Data: Patient has long history of anxiety and depression, which he identifies as stemming from a head injury at 81 years of age.  He has been functioning very well throughout his life.  He is a retired Chief Financial Officer.  He has been on a low-dose of venlafaxine for many years.  He has had a few hospitalizations, the last being in the 1990s.  The only suicide attempt that he describes is putting a bag over his head many years ago, but then he took it off.  He sees his primary care physician for medication management and has had a therapist for many years and knew "spiritual director" for the last year and a half, with whom he has a good relationship.   Patient presented voluntarily from home after worsening depression and anxiety.  Patient states that he had hernia surgery about 2 months ago and then started developing problems with his eyes.  He has seen his ophthalmologist and that problem has cleared up, but patient continues to have a lot of anxiety and depression, many times feeling overwhelmed and that life is not worth living.  He identifies his wife, family, and close friends as of great help to him.  He feels better after he talks at them, but that only lasts for a short time.  Patient does feel that he is often occupied with physical symptoms and worries a lot about that.  Patient says he came to the hospital because he believes that he needs some kind of adjustment in medication.  He has started thinking about looking for ways that he could  kill himself. Patient is very pleasant, articulate.  He speaks in linear sentences.  He has forward thinking thoughts.  Describes poor sleep and fair appetite.  Continued Clinical Symptoms:  Alcohol Use Disorder Identification Test Final Score (AUDIT): 0 The "Alcohol Use Disorders Identification Test", Guidelines for Use in Primary Care, Second Edition.  World Pharmacologist [facility]). Score between 0-7:  no or low risk or alcohol related problems. Score between 8-15:  moderate risk of alcohol related problems. Score between 16-19:  high risk of alcohol related problems. Score 20 or above:  warrants further diagnostic evaluation for alcohol dependence and treatment.   CLINICAL FACTORS:   Severe Anxiety and/or Agitation Panic Attacks Depression:   Anhedonia Insomnia Severe   Musculoskeletal: Strength & Muscle Tone: within normal limits Gait & Station: normal Patient leans: N/A  Psychiatric Specialty Exam:  Presentation  General Appearance: Appropriate for Environment  Eye Contact:Good  Speech:Clear and Coherent  Speech Volume:Normal  Handedness:No data recorded  Mood and Affect  Mood:Depressed  Affect:Congruent   Thought Process  Thought Processes:Coherent  Descriptions of Associations:Intact  Orientation:Full (Time, Place and Person)  Thought Content:Logical  History of Schizophrenia/Schizoaffective disorder:No  Duration of Psychotic Symptoms:No data recorded Hallucinations:No data recorded Ideas of Reference:None  Suicidal Thoughts:No data recorded Homicidal Thoughts:No data recorded  Sensorium  Memory:Immediate Good; Recent Good; Remote Good  Judgment:No data recorded Insight:Good   Executive Functions  Concentration:Good  Attention Span:Good  [place] of Knowledge:Good  Language:Good  Psychomotor Activity  Psychomotor Activity:No data recorded  Assets  Assets:Desire for Improvement; Communication Skills; Financial  Resources/Insurance; Housing; Intimacy; Social Support; Resilience; Physical Health   Sleep  Sleep:No data recorded    Blood pressure 125/87, pulse 64, temperature (!) 97.5 F (36.4 C), temperature source Oral, resp. rate 20, SpO2 100 %. There is no height or weight on file to calculate BMI.   COGNITIVE FEATURES THAT CONTRIBUTE TO RISK:  Thought constriction (tunnel vision)    SUICIDE RISK:   Minimal: No identifiable suicidal ideation.  Patients presenting with no risk factors but with morbid ruminations; may be classified as minimal risk based on the severity of the depressive symptoms  PLAN OF CARE: See orders  I certify that inpatient services furnished can reasonably be expected to improve the patient's condition.   Parks Ranger, DO 03/14/2022, 10:31 AM

## 2022-03-14 NOTE — Progress Notes (Signed)
Recreation Therapy Notes  INPATIENT RECREATION THERAPY ASSESSMENT  Patient Details Name: Andranik Jeune MRN: [id number] DOB: 21-Mar-1941 Today's Date: 03/14/2022       Information Obtained From: Patient  Able to Participate in Assessment/Interview: Yes  Patient Presentation: Responsive  Reason for Admission (Per Patient): Active Symptoms  Patient Stressors: Other (Comment) Public librarian)  Coping Skills:   Sports, Music, Talk, Other (Comment) (Stay busy, work, Research officer, trade union)  Leisure Interests (2+):  Music - Singing, Individual - TV, Social - Friends, Music - Listen, Individual - Tree surgeon of Recreation/Participation: Monthly  Awareness of Community Resources:  Yes  Community Resources:  [employer]  Current Use: Yes  If no, Barriers?:    Expressed Interest in [place]: Yes  [employer] of Residence:  Insurance underwriter  Patient Main Form of Transportation: Musician  Patient Strengths:  Enjoy being with people and helping  Patient Identified Areas of Improvement:  N/A  Patient Goal for Hospitalization:  Get therapy. Being able to control this anxiety  Current SI (including self-harm):  No  Current HI:  No  Current AVH: No  Staff Intervention Plan: Group Attendance, Collaborate with Interdisciplinary Treatment Team  Consent to Intern Participation: N/A  Amulya Quintin 03/14/2022, 3:05 PM

## 2022-03-15 DIAGNOSIS — F332 Major depressive disorder, recurrent severe without psychotic features: Secondary | ICD-10-CM | POA: Diagnosis not present

## 2022-03-15 MED ORDER — VENLAFAXINE HCL ER 75 MG PO CP24
150.0000 mg | ORAL_CAPSULE | Freq: Every day | ORAL | Status: DC
  Administered 2022-03-16 – 2022-03-18 (×3): 150 mg via ORAL
  Filled 2022-03-15 (×3): qty 2

## 2022-03-15 NOTE — BHH Suicide Risk Assessment (Signed)
[place] INPATIENT:  Family/Significant Other Suicide Prevention Education  Suicide Prevention Education:  Education Completed; Shakeem Stern,  wife, [phone]  has been identified by the patient as the family member/significant other with whom the patient will be residing, and identified as the person(s) who will aid the patient in the event of a mental health crisis (suicidal ideations/suicide attempt).  With written consent from the patient, the family member/significant other has been provided the following suicide prevention education, prior to the and/or following the discharge of the patient.  The suicide prevention education provided includes the following: Suicide risk factors Suicide prevention and interventions National Suicide Hotline telephone number [facility] assessment telephone number [facility] Emergency Assistance [place] and/or Residential Mobile Crisis Unit telephone number  Request made of family/significant other to: Remove weapons (e.g., guns, rifles, knives), all items previously/currently identified as safety concern.   Remove drugs/medications (over-the-counter, prescriptions, illicit drugs), all items previously/currently identified as a safety concern.  The family member/significant other verbalizes understanding of the suicide prevention education information provided.  The family member/significant other agrees to remove the items of safety concern listed above.  Haitham Dolinsky A [place] 03/15/2022, 2:10 PM

## 2022-03-15 NOTE — BH IP Treatment Plan (Signed)
Interdisciplinary Treatment and Diagnostic Plan Update  03/15/2022 Time of Session: 9:30AM John [name] MRN: [id number]  Principal Diagnosis: MDD (major depressive disorder)  Secondary Diagnoses: Principal Problem:   MDD (major depressive disorder)   Current Medications:  Current Facility-Administered Medications  Medication Dose Route Frequency Provider Last Rate Last Admin   acetaminophen (TYLENOL) tablet 650 mg  650 mg Oral Q6H PRN Sherlon Handing, NP       alum & mag hydroxide-simeth (MAALOX/MYLANTA) 200-200-20 MG/5ML suspension 30 mL  30 mL Oral Q4H PRN Waldon Merl F, NP       atenolol (TENORMIN) tablet 50 mg  50 mg Oral Daily Waldon Merl F, NP   50 mg at 03/15/22 0949   doxepin (SINEQUAN) capsule 25 mg  25 mg Oral QHS Parks Ranger, DO   25 mg at 03/14/22 2131   glipiZIDE (GLUCOTROL) tablet 5 mg  5 mg Oral BID Caroline Sauger, NP   5 mg at 03/15/22 0949   LORazepam (ATIVAN) tablet 1 mg  1 mg Oral Q6H PRN Waldon Merl F, NP   1 mg at 03/15/22 1004   magnesium hydroxide (MILK OF MAGNESIA) suspension 30 mL  30 mL Oral Daily PRN Waldon Merl F, NP   30 mL at 03/15/22 1003   risperiDONE (RISPERDAL) tablet 0.5 mg  0.5 mg Oral [id number] Parks Ranger, DO   0.5 mg at 03/15/22 0801   tamsulosin (FLOMAX) capsule 0.4 mg  0.4 mg Oral Q2200 Caroline Sauger, NP   0.4 mg at 03/14/22 2131   venlafaxine XR (EFFEXOR-XR) 24 hr capsule 75 mg  75 mg Oral QPC breakfast Parks Ranger, DO   75 mg at 03/15/22 4010   PTA Medications: Facility-Administered Medications Prior to Admission  Medication Dose Route Frequency Provider Last Rate Last Admin   gentamicin (GARAMYCIN) injection 80 mg  80 mg Intramuscular Once Hollice Espy, MD       levofloxacin [facility]) tablet 500 mg  500 mg Oral Once Hollice Espy, MD       Medications Prior to Admission  Medication Sig Dispense Refill Last Dose   aspirin EC 81 MG tablet Take 81 mg by mouth daily.       atenolol (TENORMIN) 50 MG tablet Take 50 mg by mouth daily.  0    buPROPion ER (WELLBUTRIN SR) 100 MG 12 hr tablet Take 100 mg by mouth 2 (two) times daily.      glipiZIDE (GLUCOTROL) 5 MG tablet Take 10 mg by mouth 2 (two) times daily before a meal.      oxyCODONE-acetaminophen (PERCOCET) 5-325 MG tablet Take 1 tablet by mouth every 4 (four) hours as needed for severe pain. (Patient not taking: Reported on 03/12/2022) 20 tablet 0    SUMAtriptan (IMITREX) 100 MG tablet TAKE 1 TABLET BY MOUTH ONCE AS NEEDED FOR MIGRAINE. MAY REPEAT 2ND DOSE AFTER 2 HOUR IF NEEDED      tamsulosin (FLOMAX) 0.4 MG CAPS capsule TAKE 1 CAPSULE (0.4 MG TOTAL) BY MOUTH DAILY AFTER SUPPER. 90 capsule 3    venlafaxine (EFFEXOR) 37.5 MG tablet Take 75 mg by mouth daily.       Patient Stressors: Other: vision issues after surgery.    Patient Strengths: Work Software engineer Modalities: Medication Management, Group therapy, Case management,  1 to 1 session with clinician, Psychoeducation, Recreational therapy.   Physician Treatment Plan for Primary Diagnosis: MDD (major depressive disorder) Long Term Goal(s): Improvement in symptoms so as ready for discharge   Short  Term Goals: Ability to identify changes in lifestyle to reduce recurrence of condition will improve Ability to verbalize feelings will improve Ability to disclose and discuss suicidal ideas Ability to demonstrate self-control will improve Ability to identify and develop effective coping behaviors will improve Ability to maintain clinical measurements within normal limits will improve Compliance with prescribed medications will improve Ability to identify triggers associated with substance abuse/mental health issues will improve  Medication Management: Evaluate patient's response, side effects, and tolerance of medication regimen.  Therapeutic Interventions: 1 to 1 sessions, Unit Group sessions and Medication administration.  Evaluation of  Outcomes: Not Met  Physician Treatment Plan for Secondary Diagnosis: Principal Problem:   MDD (major depressive disorder)  Long Term Goal(s): Improvement in symptoms so as ready for discharge   Short Term Goals: Ability to identify changes in lifestyle to reduce recurrence of condition will improve Ability to verbalize feelings will improve Ability to disclose and discuss suicidal ideas Ability to demonstrate self-control will improve Ability to identify and develop effective coping behaviors will improve Ability to maintain clinical measurements within normal limits will improve Compliance with prescribed medications will improve Ability to identify triggers associated with substance abuse/mental health issues will improve     Medication Management: Evaluate patient's response, side effects, and tolerance of medication regimen.  Therapeutic Interventions: 1 to 1 sessions, Unit Group sessions and Medication administration.  Evaluation of Outcomes: Not Met   RN Treatment Plan for Primary Diagnosis: MDD (major depressive disorder) Long Term Goal(s): Knowledge of disease and therapeutic regimen to maintain health will improve  Short Term Goals: Ability to remain free from injury will improve, Ability to verbalize frustration and anger appropriately will improve, Ability to demonstrate self-control, Ability to participate in decision making will improve, Ability to verbalize feelings will improve, Ability to disclose and discuss suicidal ideas, Ability to identify and develop effective coping behaviors will improve, and Compliance with prescribed medications will improve  Medication Management: RN will administer medications as ordered by provider, will assess and evaluate patient's response and provide education to patient for prescribed medication. RN will report any adverse and/or side effects to prescribing provider.  Therapeutic Interventions: 1 on 1 counseling sessions,  Psychoeducation, Medication administration, Evaluate responses to treatment, Monitor vital signs and CBGs as ordered, Perform/monitor CIWA, COWS, AIMS and Fall Risk screenings as ordered, Perform wound care treatments as ordered.  Evaluation of Outcomes: Not Met   LCSW Treatment Plan for Primary Diagnosis: MDD (major depressive disorder) Long Term Goal(s): Safe transition to appropriate next level of care at discharge, Engage patient in therapeutic group addressing interpersonal concerns.  Short Term Goals: Engage patient in aftercare planning with referrals and resources, Increase social support, Increase ability to appropriately verbalize feelings, Increase emotional regulation, Facilitate acceptance of mental health diagnosis and concerns, Identify triggers associated with mental health/substance abuse issues, and Increase skills for wellness and recovery  Therapeutic Interventions: Assess for all discharge needs, 1 to 1 time with Social worker, Explore available resources and support systems, Assess for adequacy in community support network, Educate family and significant other(s) on suicide prevention, Complete Psychosocial Assessment, Interpersonal group therapy.  Evaluation of Outcomes: Not Met   Progress in Treatment: Attending groups: No. Participating in groups: No. Taking medication as prescribed: Yes. Toleration medication: Yes. Family/Significant other contact made: No, will contact:  when given permission Patient understands diagnosis: Yes. Discussing patient identified problems/goals with staff: Yes. Medical problems stabilized or resolved: Yes. Denies suicidal/homicidal ideation: Yes. Issues/concerns per patient self-inventory: No. Other: None  New problem(s) identified: No, Describe:  None  New Short Term/Long Term Goal(s): Patient to work towards, medication management for mood stabilization; elimination of SI thoughts; development of comprehensive mental wellness  plan.   Patient Goals: "work on my depression and anxiety"    Discharge Plan or Barriers: CSW will assist pt with development of appropriate discharge/aftercare plan. No psychosocial barriers identified regarding pt's ability to return home.   Reason for Continuation of Hospitalization: Anxiety Depression Medication stabilization  Estimated Length of Stay: TBD  Last 3 [place] Suicide Severity Risk Score: [place] Admission (Current) from 03/13/2022 in [place] ED from 03/12/2022 in [place] Admission (Discharged) from 01/02/2022 in [place] No Risk No Risk No Risk       Last PHQ 2/9 Scores:    12/21/2021    8:59 AM 04/22/2017   10:46 AM  Depression screen PHQ 2/9  Decreased Interest 0 0  Down, Depressed, Hopeless 0 0  PHQ - 2 Score 0 0    Scribe for Treatment Team: Jarian Longoria A [place], Latanya Presser 03/15/2022 12:55 PM

## 2022-03-15 NOTE — Plan of Care (Signed)
Patient cooperative on unit during shift.  Pt denies SI/HI/Avh and contracts for safety.  Pt did verbalize feelings 9/10 Anxiety during shift.  Medication given to manage symptoms. Pt verbalized positive imagery as healthy coping skills to utilize during shift. Pt did comply with scheduled medications.  Pt did participate in therapeutic milieu.  Q 15 minute safety rounding in place.  Will continue to monitor pt during shift.    Problem: Education: Goal: Utilization of techniques to improve thought processes will improve Outcome: Progressing Goal: Knowledge of the prescribed therapeutic regimen will improve Outcome: Progressing   Problem: Activity: Goal: Imbalance in normal sleep/wake cycle will improve Outcome: Progressing   Problem: Coping: Goal: Coping ability will improve Outcome: Progressing Goal: Will verbalize feelings Outcome: Progressing

## 2022-03-15 NOTE — BHH Suicide Risk Assessment (Signed)
[place] INPATIENT:  Family/Significant Other Suicide Prevention Education  Suicide Prevention Education:  Contact Attempts: [name], (name of family member/significant other) has been identified by the patient as the family member/significant other with whom the patient will be residing, and identified as the person(s) who will aid the patient in the event of a mental health crisis.  With written consent from the patient, two attempts were made to provide suicide prevention education, prior to and/or following the patient's discharge.  We were unsuccessful in providing suicide prevention education.  A suicide education pamphlet was given to the patient to share with family/significant other.  Date and time of first attempt:03/15/22/2:09PM Date and time of second attempt:  John [name] 03/15/2022, 2:07 PM

## 2022-03-15 NOTE — Progress Notes (Signed)
[facility] MD Progress Note  03/15/2022 1:08 PM John [name]  MRN:  [id number] Subjective: John [name] is seen on rounds.  He is interacting well with patients and staff.  He has been compliant with his medications.  His biggest complaint is feeling groggy this morning.  I told him that the doxepin was a fairly low-dose.  But, if he continues to have problems with that we can decrease it or stop it.  In the meantime, I discussed going up on his Effexor XR.  He was on really low dose of regular Effexor 37.5 for years.  Principal Problem: MDD (major depressive disorder) Diagnosis: Principal Problem:   MDD (major depressive disorder)  Total Time spent with patient: 15 minutes  Past Psychiatric History: Patient has long history of anxiety and depression, which he identifies as stemming from a head injury at 81 years of age.  He has been functioning very well throughout his life.  He is a retired Chief Financial Officer.  He has been on a low-dose of venlafaxine for many years.  He has had a few hospitalizations, the last being in the 1990s.  The only suicide attempt that he describes is putting a bag over his head many years ago, but then he took it off.  He sees his primary care physician for medication management and has had a therapist for many years and knew "spiritual director" for the last year and a half, with whom he has a good relationship.  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   Eczema    Elevated PSA    H/O Osgood-Schlatter disease    Hand deformity, congenital    Hearing loss bilateral   Hemorrhoids    Horner syndrome    Hypertension    Migraine    Obesity    PONV (postoperative nausea and vomiting)    Prostate cancer ([place]) 2020   Rosacea    Sleep apnea     Past Surgical History:  Procedure Laterality Date   ANKLE ARTHROSCOPY  Left    fracture   CHOLECYSTECTOMY  2007   CRANIOTOMY  1958   left frontal craniotomy for epidural hematoma   INSERTION OF MESH  01/02/2022   Procedure: INSERTION OF MESH;  Surgeon: Herbert Pun, MD;  Location: [facility] ORS;  Service: General;;   LIGAMENT REPAIR Right    thumb   RETINAL DETACHMENT SURGERY     TREATMENT FISTULA ANAL  2006   Family History:  Family History  Problem Relation Age of Onset   Osteoporosis Mother    Depression Mother    Prostate cancer Father    Hypertension Father    Diabetes Brother    Kidney disease Neg Hx     Social History:  Social History   Substance and Sexual Activity  Alcohol Use Not Currently     Social History   Substance and Sexual Activity  Drug Use No    Social History   Socioeconomic History   Marital status: Married    Spouse name: [name]   Number of children: 2   Years of education: Not on file   Highest education level: Not on file  Occupational History   Not on file  Tobacco Use   Smoking status: Never   Smokeless tobacco: Never  Vaping Use   Vaping  Use: Never used  Substance and Sexual Activity   Alcohol use: Not Currently   Drug use: No   Sexual activity: Yes    Birth control/protection: None  Other Topics Concern   Not on file  Social History Narrative   Live at home with wife.    Social Determinants of Health   Financial Resource Strain: Not on file  Food Insecurity: Not on file  Transportation Needs: Not on file  Physical Activity: Not on file  Stress: Not on file  Social Connections: Not on file   Additional Social History:                         Sleep: Good  Appetite:  Good  Current Medications: Current Facility-Administered Medications  Medication Dose Route Frequency Provider Last Rate Last Admin   acetaminophen (TYLENOL) tablet 650 mg  650 mg Oral Q6H PRN Sherlon Handing, NP       alum & mag hydroxide-simeth (MAALOX/MYLANTA) 200-200-20 MG/5ML suspension 30 mL  30 mL  Oral Q4H PRN Waldon Merl F, NP       atenolol (TENORMIN) tablet 50 mg  50 mg Oral Daily Waldon Merl F, NP   50 mg at 03/15/22 0949   doxepin (SINEQUAN) capsule 25 mg  25 mg Oral QHS Parks Ranger, DO   25 mg at 03/14/22 2131   glipiZIDE (GLUCOTROL) tablet 5 mg  5 mg Oral BID Caroline Sauger, NP   5 mg at 03/15/22 0949   LORazepam (ATIVAN) tablet 1 mg  1 mg Oral Q6H PRN Waldon Merl F, NP   1 mg at 03/15/22 1004   magnesium hydroxide (MILK OF MAGNESIA) suspension 30 mL  30 mL Oral Daily PRN Waldon Merl F, NP   30 mL at 03/15/22 1003   risperiDONE (RISPERDAL) tablet 0.5 mg  0.5 mg Oral BH-q8a4p Parks Ranger, DO   0.5 mg at 03/15/22 0801   tamsulosin (FLOMAX) capsule 0.4 mg  0.4 mg Oral Q2200 Caroline Sauger, NP   0.4 mg at 03/14/22 2131   [START ON 03/16/2022] venlafaxine XR (EFFEXOR-XR) 24 hr capsule 150 mg  150 mg Oral QPC breakfast Parks Ranger, DO        Lab Results: No results found for this or any previous visit (from the past 48 hour(s)).  Blood Alcohol level:  Lab Results  Component Value Date   ETH <10 99/37/1696    Metabolic Disorder Labs: No results found for: HGBA1C, MPG No results found for: PROLACTIN No results found for: CHOL, TRIG, HDL, CHOLHDL, VLDL, LDLCALC  Physical Findings: AIMS:  , ,  ,  ,    CIWA:    COWS:     Musculoskeletal: Strength & Muscle Tone: within normal limits Gait & Station: normal Patient leans: N/A  Psychiatric Specialty Exam:  Presentation  General Appearance: Appropriate for Environment  Eye Contact:Good  Speech:Clear and Coherent  Speech Volume:Normal  Handedness:No data recorded  Mood and Affect  Mood:Depressed  Affect:Congruent   Thought Process  Thought Processes:Coherent  Descriptions of Associations:Intact  Orientation:Full (Time, Place and Person)  Thought Content:Logical  History of Schizophrenia/Schizoaffective disorder:No  Duration of Psychotic  Symptoms:No data recorded Hallucinations:No data recorded Ideas of Reference:None  Suicidal Thoughts:No data recorded Homicidal Thoughts:No data recorded  Sensorium  Memory:Immediate Good; Recent Good; Remote Good  Judgment:No data recorded Insight:Good   Executive Functions  Concentration:Good  Attention Span:Good  [place] of Knowledge:Good  Language:Good   Psychomotor  Activity  Psychomotor Activity:No data recorded  Assets  Assets:Desire for Improvement; Communication Skills; Financial Resources/Insurance; Housing; Intimacy; Social Support; Resilience; Physical Health   Sleep  Sleep:No data recorded   Physical Exam: Physical Exam Vitals and nursing note reviewed.  Constitutional:      Appearance: Normal appearance. He is normal weight.  Neurological:     General: No focal deficit present.     Mental Status: He is alert and oriented to person, place, and time.  Psychiatric:        Attention and Perception: Attention and perception normal.        Mood and Affect: Mood is depressed. Affect is flat.        Speech: Speech normal.        Behavior: Behavior normal. Behavior is cooperative.        Thought Content: Thought content normal.        Cognition and Memory: Cognition and memory normal.        Judgment: Judgment normal.   Review of Systems  Constitutional: Negative.   HENT: Negative.    Eyes: Negative.   Respiratory: Negative.    Cardiovascular: Negative.   Gastrointestinal: Negative.   Genitourinary: Negative.   Musculoskeletal: Negative.   Skin: Negative.   Neurological: Negative.   Endo/Heme/Allergies: Negative.   Psychiatric/Behavioral:  Positive for depression.   Blood pressure (!) 145/74, pulse 64, temperature 97.6 F (36.4 C), temperature source Oral, resp. rate 18, SpO2 98 %. There is no height or weight on file to calculate BMI.   Treatment Plan Summary: Daily contact with patient to assess and evaluate symptoms and progress  in treatment, Medication management, and Plan increase Effexor XR to 150 mg.  Parks Ranger, DO 03/15/2022, 1:08 PM

## 2022-03-15 NOTE — Plan of Care (Signed)
Patient remain alert and oriented, calm and cooperative during assessment. Reports sleeping well last. Denies SI, HI, and AVH. Endorsed depression of 6/10 and anxiety of 6/10. Report last bowel movement was 3 days ago. PRN MOM given and tolerated well. Ate meals in the day room among peers with good appetite. Compliant with all due medications. Currently in his room resting with Q15 minute safety check in place.  Problem: Education: Goal: Utilization of techniques to improve thought processes will improve Outcome: Progressing Goal: Knowledge of the prescribed therapeutic regimen will improve Outcome: Progressing   Problem: Activity: Goal: Interest or engagement in leisure activities will improve Outcome: Progressing Goal: Imbalance in normal sleep/wake cycle will improve Outcome: Progressing   Problem: Coping: Goal: Coping ability will improve Outcome: Progressing Goal: Will verbalize feelings Outcome: Progressing   Problem: Health Behavior/Discharge Planning: Goal: Ability to make decisions will improve Outcome: Progressing Goal: Compliance with therapeutic regimen will improve Outcome: Progressing   Problem: Role Relationship: Goal: Will demonstrate positive changes in social behaviors and relationships Outcome: Progressing   Problem: Safety: Goal: Ability to disclose and discuss suicidal ideas will improve Outcome: Progressing Goal: Ability to identify and utilize support systems that promote safety will improve Outcome: Progressing   Problem: Self-Concept: Goal: Will verbalize positive feelings about self Outcome: Progressing Goal: Level of anxiety will decrease Outcome: Progressing   Problem: Education: Goal: Knowledge of [place] [facility] Education information/materials will improve Outcome: Progressing Goal: Emotional status will improve Outcome: Progressing Goal: Mental status will improve Outcome: Progressing Goal: Verbalization of understanding the  information provided will improve Outcome: Progressing   Problem: Activity: Goal: Interest or engagement in activities will improve Outcome: Progressing Goal: Sleeping patterns will improve Outcome: Progressing   Problem: Coping: Goal: Ability to verbalize frustrations and anger appropriately will improve Outcome: Progressing Goal: Ability to demonstrate self-control will improve Outcome: Progressing   Problem: Health Behavior/Discharge Planning: Goal: Identification of resources available to assist in meeting health care needs will improve Outcome: Progressing Goal: Compliance with treatment plan for underlying cause of condition will improve Outcome: Progressing   Problem: Physical Regulation: Goal: Ability to maintain clinical measurements within normal limits will improve Outcome: Progressing   Problem: Safety: Goal: Periods of time without injury will increase Outcome: Progressing

## 2022-03-16 DIAGNOSIS — F332 Major depressive disorder, recurrent severe without psychotic features: Secondary | ICD-10-CM | POA: Diagnosis not present

## 2022-03-16 MED ORDER — DOXEPIN HCL 10 MG PO CAPS
10.0000 mg | ORAL_CAPSULE | Freq: Every day | ORAL | Status: DC
  Administered 2022-03-16 – 2022-03-18 (×3): 10 mg via ORAL
  Filled 2022-03-16 (×3): qty 1

## 2022-03-16 NOTE — Plan of Care (Signed)
Pt calm and compliant on unit.  Presents euthymic with minimal anxiety.  Pt denies SI/HI/Avh and contracts for safety.  Pt did comply with scheduled medications and states he had restful sleep.  Pt did eat hs snack and did participate in therapeutic milieu. Q 15 minute safety checks in place.  Will continue plan of care.     Problem: Education: Goal: Utilization of techniques to improve thought processes will improve Outcome: Progressing Goal: Knowledge of the prescribed therapeutic regimen will improve Outcome: Progressing   Problem: Activity: Goal: Interest or engagement in leisure activities will improve Outcome: Progressing Goal: Imbalance in normal sleep/wake cycle will improve Outcome: Progressing

## 2022-03-16 NOTE — Progress Notes (Signed)
Patient approaches the nursing station and report to writer that he is missing one of his hearing aid. Writer and MHT went into patient room to help him search for the missing hearing aid. Upon Conservation officer, nature observed a paper towel in the toilet, MHT take a comb and try to remove paper towel and hearing aid was found in the paper towel. Hearing aid remove, sanitize and kept in box at the nurses station.

## 2022-03-16 NOTE — Plan of Care (Signed)
Patient remain alert, oriented, calm and cooperative during assessment. Report sleeping well last night. Endorsed anxiety and depression of 6/10. Denies SI, HI, and AVH. Denies pain or discomfort at this time. Ate breakfast in the day room among peers with good appetite. Complains of dry mouth, ice chips provided. Compliant with all due medications. Currently standing in the hallway near the nurses station in no apparent distress at this time. Q15 minute safety check continue.  Problem: Education: Goal: Utilization of techniques to improve thought processes will improve Outcome: Progressing Goal: Knowledge of the prescribed therapeutic regimen will improve Outcome: Progressing   Problem: Activity: Goal: Interest or engagement in leisure activities will improve Outcome: Progressing Goal: Imbalance in normal sleep/wake cycle will improve Outcome: Progressing   Problem: Coping: Goal: Coping ability will improve Outcome: Progressing Goal: Will verbalize feelings Outcome: Progressing   Problem: Health Behavior/Discharge Planning: Goal: Ability to make decisions will improve Outcome: Progressing Goal: Compliance with therapeutic regimen will improve Outcome: Progressing   Problem: Role Relationship: Goal: Will demonstrate positive changes in social behaviors and relationships Outcome: Progressing   Problem: Safety: Goal: Ability to disclose and discuss suicidal ideas will improve Outcome: Progressing Goal: Ability to identify and utilize support systems that promote safety will improve Outcome: Progressing   Problem: Self-Concept: Goal: Will verbalize positive feelings about self Outcome: Progressing Goal: Level of anxiety will decrease Outcome: Progressing   Problem: Education: Goal: Knowledge of [place] [facility] Education information/materials will improve Outcome: Progressing Goal: Emotional status will improve Outcome: Progressing Goal: Mental status will  improve Outcome: Progressing Goal: Verbalization of understanding the information provided will improve Outcome: Progressing   Problem: Activity: Goal: Interest or engagement in activities will improve Outcome: Progressing Goal: Sleeping patterns will improve Outcome: Progressing   Problem: Coping: Goal: Ability to verbalize frustrations and anger appropriately will improve Outcome: Progressing Goal: Ability to demonstrate self-control will improve Outcome: Progressing   Problem: Health Behavior/Discharge Planning: Goal: Identification of resources available to assist in meeting health care needs will improve Outcome: Progressing Goal: Compliance with treatment plan for underlying cause of condition will improve Outcome: Progressing   Problem: Physical Regulation: Goal: Ability to maintain clinical measurements within normal limits will improve Outcome: Progressing   Problem: Safety: Goal: Periods of time without injury will increase Outcome: Progressing

## 2022-03-16 NOTE — Progress Notes (Signed)
[facility] MD Progress Note  03/16/2022 4:02 PM John [name]  MRN:  [id number] Subjective: pt seen and chart reviewed.  Trystyn Sitts reports increased feeling of dry mouth, and has been using ice tube to help.  Otherwise, Aslee Such reports fair mood, no Si o or HI.     Principal Problem: MDD (major depressive disorder) Diagnosis: Principal Problem:   MDD (major depressive disorder)  Total Time spent with patient: 30 minutes  Past Psychiatric History: Patient has long history of anxiety and depression, which Kemuel Buchmann identifies as stemming from a head injury at 81 years of age.  Nykia Turko has been functioning very well throughout his life.  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  Windle Huebert has been on a low-dose of venlafaxine for many years.  Michele Kerlin has had a few hospitalizations, the last being in the 1990s.  The only suicide attempt that Adreyan Carbajal describes is putting a bag over his head many years ago, but then Shawndell Varas took it off.  Brownie Gockel sees his primary care physician for medication management and has had a therapist for many years and knew "spiritual director" for the last year and a half, with whom Amoreena Neubert has a good relationship.  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   Eczema    Elevated PSA    H/O Osgood-Schlatter disease    Hand deformity, congenital    Hearing loss bilateral   Hemorrhoids    Horner syndrome    Hypertension    Migraine    Obesity    PONV (postoperative nausea and vomiting)    Prostate cancer ([place]) 2020   Rosacea    Sleep apnea     Past Surgical History:  Procedure Laterality Date   ANKLE ARTHROSCOPY Left    fracture   CHOLECYSTECTOMY  2007   CRANIOTOMY  1958   left frontal craniotomy for epidural hematoma   INSERTION OF MESH  01/02/2022   Procedure: INSERTION OF MESH;  Surgeon: Herbert Pun, MD;  Location: [facility] ORS;  Service: General;;   LIGAMENT  REPAIR Right    thumb   RETINAL DETACHMENT SURGERY     TREATMENT FISTULA ANAL  2006   Family History:  Family History  Problem Relation Age of Onset   Osteoporosis Mother    Depression Mother    Prostate cancer Father    Hypertension Father    Diabetes Brother    Kidney disease Neg Hx     Social History:  Social History   Substance and Sexual Activity  Alcohol Use Not Currently     Social History   Substance and Sexual Activity  Drug Use No    Social History   Socioeconomic History   Marital status: Married    Spouse name: [name]   Number of children: 2   Years of education: Not on file   Highest education level: Not on file  Occupational History   Not on file  Tobacco Use   Smoking status: Never   Smokeless tobacco: Never  Vaping Use   Vaping Use: Never used  Substance and Sexual Activity   Alcohol use: Not Currently   Drug use: No   Sexual activity: Yes    Birth control/protection: None  Other Topics Concern   Not on file  Social History Narrative   Live at home with  wife.    Social Determinants of Health   Financial Resource Strain: Not on file  Food Insecurity: Not on file  Transportation Needs: Not on file  Physical Activity: Not on file  Stress: Not on file  Social Connections: Not on file   Additional Social History:    Sleep: Good  Appetite:  Good  Current Medications: Current Facility-Administered Medications  Medication Dose Route Frequency Provider Last Rate Last Admin   acetaminophen (TYLENOL) tablet 650 mg  650 mg Oral Q6H PRN Sherlon Handing, NP       alum & mag hydroxide-simeth (MAALOX/MYLANTA) 200-200-20 MG/5ML suspension 30 mL  30 mL Oral Q4H PRN Waldon Merl F, NP       atenolol (TENORMIN) tablet 50 mg  50 mg Oral Daily Waldon Merl F, NP   50 mg at 03/16/22 1020   doxepin (SINEQUAN) capsule 25 mg  25 mg Oral QHS Parks Ranger, DO   25 mg at 03/15/22 2201   glipiZIDE (GLUCOTROL) tablet 5 mg  5 mg Oral BID  Caroline Sauger, NP   5 mg at 03/16/22 1021   LORazepam (ATIVAN) tablet 1 mg  1 mg Oral Q6H PRN Waldon Merl F, NP   1 mg at 03/15/22 1004   magnesium hydroxide (MILK OF MAGNESIA) suspension 30 mL  30 mL Oral Daily PRN Waldon Merl F, NP   30 mL at 03/15/22 1003   risperiDONE (RISPERDAL) tablet 0.5 mg  0.5 mg Oral BH-q8a4p Parks Ranger, DO   0.5 mg at 03/16/22 0844   tamsulosin (FLOMAX) capsule 0.4 mg  0.4 mg Oral Q2200 Caroline Sauger, NP   0.4 mg at 03/15/22 2201   venlafaxine XR (EFFEXOR-XR) 24 hr capsule 150 mg  150 mg Oral QPC breakfast Parks Ranger, DO   150 mg at 03/16/22 6767    Lab Results: No results found for this or any previous visit (from the past 48 hour(s)).  Blood Alcohol level:  Lab Results  Component Value Date   ETH <10 20/94/7096    Metabolic Disorder Labs: No results found for: HGBA1C, MPG No results found for: PROLACTIN No results found for: CHOL, TRIG, HDL, CHOLHDL, VLDL, LDLCALC  Physical Findings: AIMS:  , ,  ,  ,    CIWA:    COWS:     Musculoskeletal: Strength & Muscle Tone: within normal limits Gait & Station: normal Patient leans: N/A  Psychiatric Specialty Exam:  Presentation  General Appearance: Appropriate for Environment  Eye Contact:Good  Speech:Clear and Coherent  Speech Volume:Normal  Handedness:No data recorded  Mood and Affect  Mood:Depressed  Affect:Congruent   Thought Process  Thought Processes:Coherent  Descriptions of Associations:Intact  Orientation:Full (Time, Place and Person)  Thought Content:Logical  History of Schizophrenia/Schizoaffective disorder:No  Duration of Psychotic Symptoms:No data recorded Hallucinations:No data recorded Ideas of Reference:None  Suicidal Thoughts:No data recorded Homicidal Thoughts:No data recorded  Sensorium  Memory:Immediate Good; Recent Good; Remote Good  Judgment:No data recorded Insight:Good   Executive Functions   Concentration:Good  Attention Span:Good  [place] of Knowledge:Good  Language:Good   Psychomotor Activity  Psychomotor Activity:No data recorded  Assets  Assets:Desire for Improvement; Armed forces logistics/support/administrative officer; Financial Resources/Insurance; Housing; Intimacy; Social Support; Resilience; Physical Health   Sleep  Sleep:No data recorded   Physical Exam: Physical Exam Vitals and nursing note reviewed.  Constitutional:      Appearance: Normal appearance. Kamiya Acord is normal weight.  Neurological:     General: No focal deficit present.  Mental Status: Josephyne Tarter is alert and oriented to person, place, and time.  Psychiatric:        Attention and Perception: Attention and perception normal.        Mood and Affect: Mood is depressed. Affect is flat.        Speech: Speech normal.        Behavior: Behavior normal. Behavior is cooperative.        Thought Content: Thought content normal.        Cognition and Memory: Cognition and memory normal.        Judgment: Judgment normal.   Review of Systems  Constitutional: Negative.   HENT: Negative.    Eyes: Negative.   Respiratory: Negative.    Cardiovascular: Negative.   Gastrointestinal: Negative.   Genitourinary: Negative.   Musculoskeletal: Negative.   Skin: Negative.   Neurological: Negative.   Endo/Heme/Allergies: Negative.   Psychiatric/Behavioral:  Positive for depression.   Blood pressure (!) 141/92, pulse 67, temperature 98.1 F (36.7 C), temperature source Oral, resp. rate 20, SpO2 97 %. There is no height or weight on file to calculate BMI.   Treatment Plan Summary: Daily contact with patient to assess and evaluate symptoms and progress in treatment and Medication management  MDD, severe -- reduced Doxepin from '25mg'$  to '10mg'$  qhs due to pt's complaints of dry mouth (anticholingeric) -- continue Effexor XR '150mg'$  daily.  -- continue Risperdal 0.'5mg'$  po BID.     Torra Pala, MD 03/16/2022, 4:02 PM

## 2022-03-17 DIAGNOSIS — F332 Major depressive disorder, recurrent severe without psychotic features: Secondary | ICD-10-CM | POA: Diagnosis not present

## 2022-03-17 LAB — GLUCOSE, CAPILLARY: Glucose-Capillary: 120 mg/dL — ABNORMAL HIGH (ref 70–99)

## 2022-03-17 NOTE — Group Note (Signed)
LCSW Group Therapy Note  Group Date: 03/17/2022 Start Time: 1400 End Time: 1500   Type of Therapy and Topic:  Group Therapy - Healthy vs Unhealthy Coping Skills  Participation Level:  Did Not Attend   Description of Group The focus of this group was to determine what unhealthy coping techniques typically are used by group members and what healthy coping techniques would be helpful in coping with various problems. Patients were guided in becoming aware of the differences between healthy and unhealthy coping techniques. Patients were asked to identify 2-3 healthy coping skills they would like to learn to use more effectively.  Therapeutic Goals Patients learned that coping is what human beings do all day long to deal with various situations in their lives Patients defined and discussed healthy vs unhealthy coping techniques Patients identified their preferred coping techniques and identified whether these were healthy or unhealthy Patients determined 2-3 healthy coping skills they would like to become more familiar with and use more often. Patients provided support and ideas to each other   Summary of Patient Progress:  Patient did not attend group despite encouraged participation.   Therapeutic Modalities Cognitive Behavioral Therapy Motivational Interviewing  Berniece Salines, Latanya Presser 03/17/2022  4:20 PM

## 2022-03-17 NOTE — Progress Notes (Signed)
Patient appeared to be less anxious toward the afternoon and attended evening activities: went outside with peers for group activities. Pleasant and cooperative with no sign of distress. Encouragements and support provided. Safety monitored as recommended.

## 2022-03-17 NOTE — Plan of Care (Signed)
Patient was up this morning and became more and more anxious. Received Ativan and went to sleep until lunch time. No additional concerns. Alert and oriented and denying SI/HI/AVH. Appetite is good. No major concerns at this time.

## 2022-03-17 NOTE — Progress Notes (Signed)
[facility] MD Progress Note  03/17/2022 12:41 PM John [name]  MRN:  [id number] Subjective: pt seen and chart reviewed.  John [name] said that his depression "lingered" after his surgery, and is wondering when John [name] will be normal again. John [name] has come thought blocking at times.  John [name] was informed regarding the time it takes the antidepressant to reach its full effects (4-6 weeks).    No SI, no more dry month   John [name] used one dose of PRN ativan '1mg'$  at 8:45 AM.   Principal Problem: MDD (major depressive disorder) Diagnosis: Principal Problem:   MDD (major depressive disorder)  Total Time spent with patient: 30 minutes  Past Psychiatric History: Patient has long history of anxiety and depression, which John [name] identifies as stemming from a head injury at 81 years of age.  John [name] has been functioning very well throughout his life.  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  John [name] has been on a low-dose of venlafaxine for many years.  John [name] has had a few hospitalizations, the last being in the 1990s.  The only suicide attempt that John [name] describes is putting a bag over his head many years ago, but then John [name] took it off.  John [name] sees his primary care physician for medication management and has had a therapist for many years and knew "spiritual director" for the last year and a half, with whom John [name] has a good relationship.  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   Eczema    Elevated PSA    H/O Osgood-Schlatter disease    Hand deformity, congenital    Hearing loss bilateral   Hemorrhoids    Horner syndrome    Hypertension    Migraine    Obesity    PONV (postoperative nausea and vomiting)    Prostate cancer ([place]) 2020   Rosacea    Sleep apnea     Past Surgical History:  Procedure Laterality Date   ANKLE ARTHROSCOPY Left    fracture   CHOLECYSTECTOMY  2007   CRANIOTOMY  1958    left frontal craniotomy for epidural hematoma   INSERTION OF MESH  01/02/2022   Procedure: INSERTION OF MESH;  Surgeon: Herbert Pun, MD;  Location: [facility] ORS;  Service: General;;   LIGAMENT REPAIR Right    thumb   RETINAL DETACHMENT SURGERY     TREATMENT FISTULA ANAL  2006   Family History:  Family History  Problem Relation Age of Onset   Osteoporosis Mother    Depression Mother    Prostate cancer Father    Hypertension Father    Diabetes Brother    Kidney disease Neg Hx     Social History:  Social History   Substance and Sexual Activity  Alcohol Use Not Currently     Social History   Substance and Sexual Activity  Drug Use No    Social History   Socioeconomic History   Marital status: Married    Spouse name: [name]   Number of children: 2   Years of education: Not on file   Highest education level: Not on file  Occupational History   Not on file  Tobacco Use   Smoking status: Never   Smokeless tobacco: Never  Vaping Use   Vaping Use: Never used  Substance and Sexual Activity   Alcohol use:  Not Currently   Drug use: No   Sexual activity: Yes    Birth control/protection: None  Other Topics Concern   Not on file  Social History Narrative   Live at home with wife.    Social Determinants of Health   Financial Resource Strain: Not on file  Food Insecurity: Not on file  Transportation Needs: Not on file  Physical Activity: Not on file  Stress: Not on file  Social Connections: Not on file   Additional Social History:    Sleep: Good  Appetite:  Good  Current Medications: Current Facility-Administered Medications  Medication Dose Route Frequency Provider Last Rate Last Admin   acetaminophen (TYLENOL) tablet 650 mg  650 mg Oral Q6H PRN Sherlon Handing, NP       alum & mag hydroxide-simeth (MAALOX/MYLANTA) 200-200-20 MG/5ML suspension 30 mL  30 mL Oral Q4H PRN Waldon Merl F, NP       atenolol (TENORMIN) tablet 50 mg  50 mg Oral Daily  Waldon Merl F, NP   50 mg at 03/17/22 1209   doxepin (SINEQUAN) capsule 10 mg  10 mg Oral QHS Jannae Fagerstrom, MD   10 mg at 03/16/22 2122   glipiZIDE (GLUCOTROL) tablet 5 mg  5 mg Oral BID Caroline Sauger, NP   5 mg at 03/17/22 1209   LORazepam (ATIVAN) tablet 1 mg  1 mg Oral Q6H PRN Waldon Merl F, NP   1 mg at 03/17/22 0845   magnesium hydroxide (MILK OF MAGNESIA) suspension 30 mL  30 mL Oral Daily PRN Waldon Merl F, NP   30 mL at 03/15/22 1003   risperiDONE (RISPERDAL) tablet 0.5 mg  0.5 mg Oral BH-q8a4p Parks Ranger, DO   0.5 mg at 03/17/22 0845   tamsulosin (FLOMAX) capsule 0.4 mg  0.4 mg Oral Q2200 Caroline Sauger, NP   0.4 mg at 03/16/22 2122   venlafaxine XR (EFFEXOR-XR) 24 hr capsule 150 mg  150 mg Oral QPC breakfast Parks Ranger, DO   150 mg at 03/17/22 0845    Lab Results:  Results for orders placed or performed during the hospital encounter of 03/13/22 (from the past 48 hour(s))  Glucose, capillary     Status: Abnormal   Collection Time: 03/17/22  7:17 AM  Result Value Ref Range   Glucose-Capillary 120 (H) 70 - 99 mg/dL    Comment: Glucose reference range applies only to samples taken after fasting for at least 8 hours.    Blood Alcohol level:  Lab Results  Component Value Date   ETH <10 74/25/9563    Metabolic Disorder Labs: No results found for: HGBA1C, MPG No results found for: PROLACTIN No results found for: CHOL, TRIG, HDL, CHOLHDL, VLDL, LDLCALC  Musculoskeletal: Strength & Muscle Tone: within normal limits Gait & Station: normal Patient leans: N/A  Psychiatric Specialty Exam:  Presentation  General Appearance: Appropriate for Environment  Eye Contact:Good  Speech:Clear and Coherent  Speech Volume:Normal  Handedness:No data recorded  Mood and Affect  Mood:Depressed  Affect:Congruent   Thought Process  Thought Processes:Coherent  Descriptions of Associations:Intact  Orientation:Full (Time, Place and  Person)  Thought Content:Logical  History of Schizophrenia/Schizoaffective disorder:No  Duration of Psychotic Symptoms:No data recorded Hallucinations:No data recorded Ideas of Reference:None  Suicidal Thoughts:No data recorded Homicidal Thoughts:No data recorded  Sensorium  Memory:Immediate Good; Recent Good; Remote Good  Judgment:No data recorded Insight:Good   Executive Functions  Concentration:Good  Attention Span:Good  Recall:Good  Fund of Knowledge:Good  Language:Good   Psychomotor  Activity  Psychomotor Activity:No data recorded  Assets  Assets:Desire for Improvement; Communication Skills; Financial Resources/Insurance; Housing; Intimacy; Social Support; Resilience; Physical Health   Sleep  Sleep:No data recorded   Physical Exam: Physical Exam Vitals and nursing note reviewed.  Constitutional:      Appearance: Normal appearance. Halla Chopp is normal weight.  Neurological:     General: No focal deficit present.     Mental Status: Toby Ayad is alert and oriented to person, place, and time.  Psychiatric:        Attention and Perception: Attention and perception normal.        Mood and Affect: Mood is depressed. Affect is flat.        Speech: Speech normal.        Behavior: Behavior normal. Behavior is cooperative.        Thought Content: Thought content normal.        Cognition and Memory: Cognition and memory normal.        Judgment: Judgment normal.   Review of Systems  Constitutional: Negative.   HENT: Negative.    Eyes: Negative.   Respiratory: Negative.    Cardiovascular: Negative.   Gastrointestinal: Negative.   Genitourinary: Negative.   Musculoskeletal: Negative.   Skin: Negative.   Neurological: Negative.   Endo/Heme/Allergies: Negative.   Psychiatric/Behavioral:  Positive for depression.   Blood pressure 134/79, pulse 73, temperature 98.1 F (36.7 C), temperature source Oral, resp. rate 18, SpO2 99 %. There is no height or weight on file to  calculate BMI.   Treatment Plan Summary: Daily contact with patient to assess and evaluate symptoms and progress in treatment and Medication management  MDD, severe -- reduced Doxepin from '25mg'$  to '10mg'$  qhs due to pt's complaints of dry mouth (anticholingeric) yesterday.  Dry mouth seems to be improved.  -- continue Effexor XR '150mg'$  daily.  -- continue Risperdal 0.'5mg'$  po BID.     Zanovia Rotz, MD 03/17/2022, 12:41 PM

## 2022-03-18 MED ORDER — VENLAFAXINE HCL ER 75 MG PO CP24
225.0000 mg | ORAL_CAPSULE | Freq: Every day | ORAL | Status: DC
  Administered 2022-03-19 – 2022-03-21 (×3): 225 mg via ORAL
  Filled 2022-03-18 (×3): qty 3

## 2022-03-18 NOTE — [facility] Group Notes (Signed)
10:30am Group: Chair Yoga Pt refused to attend - prefer to be in room resting

## 2022-03-18 NOTE — Plan of Care (Signed)
  Problem: Education: Goal: Utilization of techniques to improve thought processes will improve Outcome: Progressing Goal: Knowledge of the prescribed therapeutic regimen will improve Outcome: Progressing   Problem: Activity: Goal: Interest or engagement in leisure activities will improve Outcome: Progressing Goal: Imbalance in normal sleep/wake cycle will improve Outcome: Progressing   

## 2022-03-18 NOTE — Progress Notes (Signed)
Pt calm and compliant during shift.  Pt denies SI/HI/AV/H and contracts for safety.  Pt did endorse "high's and low's anxiety" during shift.  Pt informed and educated that scheduled meds take time to become therapeutic.  Prn med given to manage current anxiety.  Pt denies any adverse reactions form medications given. Pt did have hs snack and adequate fluids.  Q 15 minute safety checks in place. Will continue plan of care.

## 2022-03-18 NOTE — [facility] Group Notes (Signed)
15:00 Group: outdoor patio  Activities: Q ball, music selection  Pt refused to participate in activities  PT took snack to day room

## 2022-03-18 NOTE — Progress Notes (Signed)
Pt has been isolative mostly and to himself in the dayroom. He has been calm, cooperative and med compliant. He went outside for a little while and did his laundry. Collier Bullock RN

## 2022-03-18 NOTE — Progress Notes (Signed)
[facility] MD Progress Note  03/18/2022 1:19 PM John [name]  MRN:  [id number] Subjective: John [name] is slowly getting better.  He has a history of head injury as a teenager from baseball.  He says that he has struggled with depression since then.  Most recently was after having hernia repair.  So far, he is tolerating the medications without any side effects.  He states that he did take some Ativan last night to help him sleep.  I told him that the medications take time to work but we will go up on his Effexor XR in the meantime.  Principal Problem: MDD (major depressive disorder) Diagnosis: Principal Problem:   MDD (major depressive disorder)  Total Time spent with patient: 15 minutes  Past Psychiatric History:  Patient has long history of anxiety and depression, which he identifies as stemming from a head injury at 81 years of age.  He has been functioning very well throughout his life.  He is a retired Chief Financial Officer.  He has been on a low-dose of venlafaxine for many years.  He has had a few hospitalizations, the last being in the 1990s.  The only suicide attempt that he describes is putting a bag over his head many years ago, but then he took it off.  He sees his primary care physician for medication management and has had a therapist for many years and knew "spiritual director" for the last year and a half, with whom he has a good relationship.  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   Eczema    Elevated PSA    H/O Osgood-Schlatter disease    Hand deformity, congenital    Hearing loss bilateral   Hemorrhoids    Horner syndrome    Hypertension    Migraine    Obesity    PONV (postoperative nausea and vomiting)    Prostate cancer ([place]) 2020   Rosacea    Sleep apnea     Past Surgical History:  Procedure Laterality Date    ANKLE ARTHROSCOPY Left    fracture   CHOLECYSTECTOMY  2007   CRANIOTOMY  1958   left frontal craniotomy for epidural hematoma   INSERTION OF MESH  01/02/2022   Procedure: INSERTION OF MESH;  Surgeon: Herbert Pun, MD;  Location: [facility] ORS;  Service: General;;   LIGAMENT REPAIR Right    thumb   RETINAL DETACHMENT SURGERY     TREATMENT FISTULA ANAL  2006   Family History:  Family History  Problem Relation Age of Onset   Osteoporosis Mother    Depression Mother    Prostate cancer Father    Hypertension Father    Diabetes Brother    Kidney disease Neg Hx     Social History:  Social History   Substance and Sexual Activity  Alcohol Use Not Currently     Social History   Substance and Sexual Activity  Drug Use No    Social History   Socioeconomic History   Marital status: Married    Spouse name: [name]   Number of children: 2   Years of education: Not on file   Highest education level: Not on file  Occupational History   Not on file  Tobacco Use   Smoking status: Never   Smokeless tobacco: Never  Vaping  Use   Vaping Use: Never used  Substance and Sexual Activity   Alcohol use: Not Currently   Drug use: No   Sexual activity: Yes    Birth control/protection: None  Other Topics Concern   Not on file  Social History Narrative   Live at home with wife.    Social Determinants of Health   Financial Resource Strain: Not on file  Food Insecurity: Not on file  Transportation Needs: Not on file  Physical Activity: Not on file  Stress: Not on file  Social Connections: Not on file   Additional Social History:                         Sleep: Fair  Appetite:  Fair  Current Medications: Current Facility-Administered Medications  Medication Dose Route Frequency Provider Last Rate Last Admin   acetaminophen (TYLENOL) tablet 650 mg  650 mg Oral Q6H PRN Sherlon Handing, NP       alum & mag hydroxide-simeth (MAALOX/MYLANTA) 200-200-20 MG/5ML  suspension 30 mL  30 mL Oral Q4H PRN Waldon Merl F, NP       atenolol (TENORMIN) tablet 50 mg  50 mg Oral Daily Waldon Merl F, NP   50 mg at 03/18/22 0935   doxepin (SINEQUAN) capsule 10 mg  10 mg Oral QHS He, Jun, MD   10 mg at 03/17/22 2107   glipiZIDE (GLUCOTROL) tablet 5 mg  5 mg Oral BID Caroline Sauger, NP   5 mg at 03/18/22 0935   LORazepam (ATIVAN) tablet 1 mg  1 mg Oral Q6H PRN Waldon Merl F, NP   1 mg at 03/17/22 2115   magnesium hydroxide (MILK OF MAGNESIA) suspension 30 mL  30 mL Oral Daily PRN Waldon Merl F, NP   30 mL at 03/15/22 1003   risperiDONE (RISPERDAL) tablet 0.5 mg  0.5 mg Oral BH-q8a4p Parks Ranger, DO   0.5 mg at 03/18/22 0932   tamsulosin (FLOMAX) capsule 0.4 mg  0.4 mg Oral Q2200 Caroline Sauger, NP   0.4 mg at 03/17/22 2107   [START ON 03/19/2022] venlafaxine XR (EFFEXOR-XR) 24 hr capsule 225 mg  225 mg Oral QPC breakfast Parks Ranger, DO        Lab Results:  Results for orders placed or performed during the hospital encounter of 03/13/22 (from the past 48 hour(s))  Glucose, capillary     Status: Abnormal   Collection Time: 03/17/22  7:17 AM  Result Value Ref Range   Glucose-Capillary 120 (H) 70 - 99 mg/dL    Comment: Glucose reference range applies only to samples taken after fasting for at least 8 hours.    Blood Alcohol level:  Lab Results  Component Value Date   ETH <10 41/28/7867    Metabolic Disorder Labs: No results found for: HGBA1C, MPG No results found for: PROLACTIN No results found for: CHOL, TRIG, HDL, CHOLHDL, VLDL, LDLCALC  Physical Findings: AIMS:  , ,  ,  ,    CIWA:    COWS:     Musculoskeletal: Strength & Muscle Tone: within normal limits Gait & Station: normal Patient leans: N/A  Psychiatric Specialty Exam:  Presentation  General Appearance: Appropriate for Environment  Eye Contact:Good  Speech:Clear and Coherent  Speech Volume:Normal  Handedness:No data  recorded  Mood and Affect  Mood:Depressed  Affect:Congruent   Thought Process  Thought Processes:Coherent  Descriptions of Associations:Intact  Orientation:Full (Time, Place and Person)  Thought Content:Logical  History  of Schizophrenia/Schizoaffective disorder:No  Duration of Psychotic Symptoms:No data recorded Hallucinations:No data recorded Ideas of Reference:None  Suicidal Thoughts:No data recorded Homicidal Thoughts:No data recorded  Sensorium  Memory:Immediate Good; Recent Good; Remote Good  Judgment:No data recorded Insight:Good   Executive Functions  Concentration:Good  Attention Span:Good  [place]  Language:Good   Psychomotor Activity  Psychomotor Activity:No data recorded  Assets  Assets:Desire for Improvement; Armed forces logistics/support/administrative officer; Financial Resources/Insurance; Housing; Intimacy; Social Support; Resilience; Physical Health   Sleep  Sleep:No data recorded   Physical Exam: Physical Exam Vitals and nursing note reviewed.  Constitutional:      Appearance: Normal appearance. He is normal weight.  Neurological:     General: No focal deficit present.     Mental Status: He is alert and oriented to person, place, and time.  Psychiatric:        Attention and Perception: Attention and perception normal.        Mood and Affect: Mood is anxious and depressed.        Speech: Speech normal.        Behavior: Behavior normal. Behavior is cooperative.        Thought Content: Thought content normal.        Cognition and Memory: Cognition and memory normal.        Judgment: Judgment normal.   Review of Systems  Constitutional: Negative.   HENT: Negative.    Eyes: Negative.   Respiratory: Negative.    Cardiovascular: Negative.   Gastrointestinal: Negative.   Genitourinary: Negative.   Musculoskeletal: Negative.   Skin: Negative.   Neurological: Negative.   Endo/Heme/Allergies: Negative.   Psychiatric/Behavioral:   Positive for depression. The patient has insomnia.   Blood pressure 140/83, pulse 68, temperature 98 F (36.7 C), temperature source Oral, resp. rate 20, SpO2 99 %. There is no height or weight on file to calculate BMI.   Treatment Plan Summary: Daily contact with patient to assess and evaluate symptoms and progress in treatment, Medication management, and Plan increase Effexor XR to 225 mg/day.  Loiza, DO 03/18/2022, 1:19 PM

## 2022-03-18 NOTE — Plan of Care (Signed)
Pt rates depression and anxiety both 3/10, denies SI, HI and AVH. Pt was educated on care plan and verbalizes understanding. Collier Bullock RN Problem: Education: Goal: Utilization of techniques to improve thought processes will improve Outcome: Progressing Goal: Knowledge of the prescribed therapeutic regimen will improve Outcome: Progressing   Problem: Activity: Goal: Interest or engagement in leisure activities will improve Outcome: Progressing Goal: Imbalance in normal sleep/wake cycle will improve Outcome: Progressing   Problem: Coping: Goal: Coping ability will improve Outcome: Progressing Goal: Will verbalize feelings Outcome: Progressing   Problem: Health Behavior/Discharge Planning: Goal: Ability to make decisions will improve Outcome: Progressing Goal: Compliance with therapeutic regimen will improve Outcome: Progressing   Problem: Role Relationship: Goal: Will demonstrate positive changes in social behaviors and relationships Outcome: Progressing   Problem: Safety: Goal: Ability to disclose and discuss suicidal ideas will improve Outcome: Progressing Goal: Ability to identify and utilize support systems that promote safety will improve Outcome: Progressing   Problem: Self-Concept: Goal: Will verbalize positive feelings about self Outcome: Progressing Goal: Level of anxiety will decrease Outcome: Progressing   Problem: Education: Goal: Knowledge of [facility] Education information/materials will improve Outcome: Progressing Goal: Emotional status will improve Outcome: Progressing Goal: Mental status will improve Outcome: Progressing Goal: Verbalization of understanding the information provided will improve Outcome: Progressing   Problem: Activity: Goal: Interest or engagement in activities will improve Outcome: Progressing Goal: Sleeping patterns will improve Outcome: Progressing   Problem: Coping: Goal: Ability to verbalize frustrations and  anger appropriately will improve Outcome: Progressing Goal: Ability to demonstrate self-control will improve Outcome: Progressing   Problem: Health Behavior/Discharge Planning: Goal: Identification of resources available to assist in meeting health care needs will improve Outcome: Progressing Goal: Compliance with treatment plan for underlying cause of condition will improve Outcome: Progressing   Problem: Physical Regulation: Goal: Ability to maintain clinical measurements within normal limits will improve Outcome: Progressing   Problem: Safety: Goal: Periods of time without injury will increase Outcome: Progressing

## 2022-03-19 DIAGNOSIS — F332 Major depressive disorder, recurrent severe without psychotic features: Secondary | ICD-10-CM | POA: Diagnosis not present

## 2022-03-19 LAB — LIPID PANEL
Cholesterol: 131 mg/dL (ref 0–200)
HDL: 33 mg/dL — ABNORMAL LOW (ref >40)
LDL Cholesterol: 80 mg/dL (ref 0–99)
Total CHOL/HDL Ratio: 4 RATIO
Triglycerides: 92 mg/dL (ref <150)
VLDL: 18 mg/dL (ref 0–40)

## 2022-03-19 LAB — HEMOGLOBIN A1C
Hgb A1c MFr Bld: 5.6 % (ref 4.8–5.6)
Mean Plasma Glucose: 114.02 mg/dL

## 2022-03-19 MED ORDER — LORAZEPAM 0.5 MG PO TABS
0.5000 mg | ORAL_TABLET | ORAL | Status: DC | PRN
  Administered 2022-03-19 – 2022-03-31 (×20): 0.5 mg via ORAL
  Filled 2022-03-19 (×24): qty 1

## 2022-03-19 MED ORDER — DOXEPIN HCL 25 MG PO CAPS
25.0000 mg | ORAL_CAPSULE | Freq: Every day | ORAL | Status: DC
  Administered 2022-03-19 – 2022-03-20 (×2): 25 mg via ORAL
  Filled 2022-03-19 (×2): qty 1

## 2022-03-19 NOTE — Progress Notes (Signed)
[facility] MD Progress Note  03/19/2022 11:30 AM John [name]  MRN:  [id number] Subjective:  I started him on doxepin 10 mg at bedtime last night.  He states he slept well but he also took a milligram of Ativan. John [name] is seen on rounds today.  We had a discussion about taking half milligram of Ativan in the daytime if he is anxious like he is right now and increasing the doxepin dose.  He agreed with that.  He denies any side effects from his medications.  Principal Problem: MDD (major depressive disorder) Diagnosis: Principal Problem:   MDD (major depressive disorder)  Total Time spent with patient: 15 minutes  Past Psychiatric History: Patient has long history of anxiety and depression, which he identifies as stemming from a head injury at 81 years of age.  He has been functioning very well throughout his life.  He is a retired Chief Financial Officer.  He has been on a low-dose of venlafaxine for many years.  He has had a few hospitalizations, the last being in the 1990s.  The only suicide attempt that he describes is putting a bag over his head many years ago, but then he took it off.  He sees his primary care physician for medication management and has had a therapist for many years and knew "spiritual director" for the last year and a half, with whom he has a good relationship.  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   Eczema    Elevated PSA    H/O Osgood-Schlatter disease    Hand deformity, congenital    Hearing loss bilateral   Hemorrhoids    Horner syndrome    Hypertension    Migraine    Obesity    PONV (postoperative nausea and vomiting)    Prostate cancer ([place]) 2020   Rosacea    Sleep apnea     Past Surgical History:  Procedure Laterality Date   ANKLE ARTHROSCOPY Left    fracture   CHOLECYSTECTOMY  2007   CRANIOTOMY   1958   left frontal craniotomy for epidural hematoma   INSERTION OF MESH  01/02/2022   Procedure: INSERTION OF MESH;  Surgeon: Herbert Pun, MD;  Location: [facility] ORS;  Service: General;;   LIGAMENT REPAIR Right    thumb   RETINAL DETACHMENT SURGERY     TREATMENT FISTULA ANAL  2006   Family History:  Family History  Problem Relation Age of Onset   Osteoporosis Mother    Depression Mother    Prostate cancer Father    Hypertension Father    Diabetes Brother    Kidney disease Neg Hx     Social History:  Social History   Substance and Sexual Activity  Alcohol Use Not Currently     Social History   Substance and Sexual Activity  Drug Use No    Social History   Socioeconomic History   Marital status: Married    Spouse name: [name]   Number of children: 2   Years of education: Not on file   Highest education level: Not on file  Occupational History   Not on file  Tobacco Use   Smoking status: Never   Smokeless tobacco: Never  Vaping Use   Vaping Use: Never used  Substance and Sexual Activity  Alcohol use: Not Currently   Drug use: No   Sexual activity: Yes    Birth control/protection: None  Other Topics Concern   Not on file  Social History Narrative   Live at home with wife.    Social Determinants of Health   Financial Resource Strain: Not on file  Food Insecurity: Not on file  Transportation Needs: Not on file  Physical Activity: Not on file  Stress: Not on file  Social Connections: Not on file   Additional Social History:                         Sleep: Good  Appetite:  Good  Current Medications: Current Facility-Administered Medications  Medication Dose Route Frequency Provider Last Rate Last Admin   acetaminophen (TYLENOL) tablet 650 mg  650 mg Oral Q6H PRN Sherlon Handing, NP       alum & mag hydroxide-simeth (MAALOX/MYLANTA) 200-200-20 MG/5ML suspension 30 mL  30 mL Oral Q4H PRN Sherlon Handing, NP       atenolol  (TENORMIN) tablet 50 mg  50 mg Oral Daily Waldon Merl F, NP   50 mg at 03/19/22 3825   doxepin (SINEQUAN) capsule 25 mg  25 mg Oral QHS Torrie Namba Edward, DO       glipiZIDE (GLUCOTROL) tablet 5 mg  5 mg Oral BID Caroline Sauger, NP   5 mg at 03/19/22 0539   LORazepam (ATIVAN) tablet 0.5 mg  0.5 mg Oral Q4H PRN Parks Ranger, DO       magnesium hydroxide (MILK OF MAGNESIA) suspension 30 mL  30 mL Oral Daily PRN Waldon Merl F, NP   30 mL at 03/15/22 1003   risperiDONE (RISPERDAL) tablet 0.5 mg  0.5 mg Oral [id number] Parks Ranger, DO   0.5 mg at 03/19/22 0905   tamsulosin (FLOMAX) capsule 0.4 mg  0.4 mg Oral Q2200 Caroline Sauger, NP   0.4 mg at 03/18/22 2158   venlafaxine XR (EFFEXOR-XR) 24 hr capsule 225 mg  225 mg Oral QPC breakfast Parks Ranger, DO   225 mg at 03/19/22 7673    Lab Results:  Results for orders placed or performed during the hospital encounter of 03/13/22 (from the past 48 hour(s))  Lipid panel     Status: Abnormal   Collection Time: 03/19/22  7:01 AM  Result Value Ref Range   Cholesterol 131 0 - 200 mg/dL   Triglycerides 92 <150 mg/dL   HDL 33 (L) >40 mg/dL   Total CHOL/HDL Ratio 4.0 RATIO   VLDL 18 0 - 40 mg/dL   LDL Cholesterol 80 0 - 99 mg/dL    Comment:        Total Cholesterol/HDL:CHD Risk Coronary Heart Disease Risk Table                     Men   Women  1/2 Average Risk   3.4   3.3  Average Risk       5.0   4.4  2 X Average Risk   9.6   7.1  3 X Average Risk  23.4   11.0        Use the calculated Patient Ratio above and the CHD Risk Table to determine the patient's CHD Risk.        ATP III CLASSIFICATION (LDL):  <100     mg/dL   Optimal  100-129  mg/dL   Near or Above  Optimal  130-159  mg/dL   Borderline  160-189  mg/dL   High  >190     mg/dL   Very High Performed at [facility], [address] [place], [place] [address]     Blood Alcohol level:  Lab Results   Component Value Date   [facility] <10 85/46/2703    Metabolic Disorder Labs: No results found for: HGBA1C, MPG No results found for: PROLACTIN Lab Results  Component Value Date   CHOL 131 03/19/2022   TRIG 92 03/19/2022   HDL 33 (L) 03/19/2022   CHOLHDL 4.0 03/19/2022   VLDL 18 03/19/2022   LDLCALC 80 03/19/2022    Physical Findings: AIMS:  , ,  ,  ,    CIWA:    COWS:     Musculoskeletal: Strength & Muscle Tone: within normal limits Gait & Station: normal Patient leans: N/A  Psychiatric Specialty Exam:  Presentation  General Appearance: Appropriate for Environment  Eye Contact:Good  Speech:Clear and Coherent  Speech Volume:Normal  Handedness:No data recorded  Mood and Affect  Mood:Depressed  Affect:Congruent   Thought Process  Thought Processes:Coherent  Descriptions of Associations:Intact  Orientation:Full (Time, Place and Person)  Thought Content:Logical  History of Schizophrenia/Schizoaffective disorder:No  Duration of Psychotic Symptoms:No data recorded Hallucinations:No data recorded Ideas of Reference:None  Suicidal Thoughts:No data recorded Homicidal Thoughts:No data recorded  Sensorium  Memory:Immediate Good; Recent Good; Remote Good  Judgment:No data recorded Insight:Good   Executive Functions  Concentration:Good  Attention Span:Good  [place] of Knowledge:Good  Language:Good   Psychomotor Activity  Psychomotor Activity:No data recorded  Assets  Assets:Desire for Improvement; Armed forces logistics/support/administrative officer; Financial Resources/Insurance; Housing; Intimacy; Social Support; Resilience; Physical Health   Sleep  Sleep:No data recorded   Physical Exam: Physical Exam Vitals and nursing note reviewed.  Constitutional:      Appearance: Normal appearance. He is normal weight.  Neurological:     General: No focal deficit present.     Mental Status: He is alert and oriented to person, place, and time.  Psychiatric:         Attention and Perception: Attention and perception normal.        Mood and Affect: Mood is depressed. Affect is flat.        Speech: Speech normal.        Behavior: Behavior normal. Behavior is cooperative.        Thought Content: Thought content normal.        Cognition and Memory: Cognition and memory normal.        Judgment: Judgment normal.   Review of Systems  Constitutional: Negative.   HENT: Negative.    Eyes: Negative.   Respiratory: Negative.    Cardiovascular: Negative.   Gastrointestinal: Negative.   Genitourinary: Negative.   Musculoskeletal: Negative.   Skin: Negative.   Neurological: Negative.   Endo/Heme/Allergies: Negative.   Psychiatric/Behavioral:  Positive for depression. The patient has insomnia.   Blood pressure (!) 147/94, pulse 67, temperature 97.7 F (36.5 C), temperature source Oral, resp. rate 18, SpO2 100 %. There is no height or weight on file to calculate BMI.   Treatment Plan Summary: Daily contact with patient to assess and evaluate symptoms and progress in treatment, Medication management, and Plan increase doxepin to 25 mg at bedtime.  Change Ativan to 0.5 mg as needed anxiety and try not to take it at bedtime.  [place], DO 03/19/2022, 11:30 AM

## 2022-03-19 NOTE — Plan of Care (Signed)
Patient anxious but cooperative on unit during shift.  Pt denies SI/HI/AV/H.  Pt did endorse Anxiety 4/10 "I think I missed my wife's birthday".  Encouragement and support given.  Medications given to manage symptoms.  Q 15 min safety checks in place.  Will continue to  plan of care.    Problem: Education: Goal: Utilization of techniques to improve thought processes will improve Outcome: Progressing Goal: Knowledge of the prescribed therapeutic regimen will improve Outcome: Progressing   Problem: Activity: Goal: Interest or engagement in leisure activities will improve Outcome: Progressing Goal: Imbalance in normal sleep/wake cycle will improve Outcome: Progressing   Problem: Role Relationship: Goal: Will demonstrate positive changes in social behaviors and relationships Outcome: Progressing   Problem: Safety: Goal: Ability to disclose and discuss suicidal ideas will improve Outcome: Progressing

## 2022-03-19 NOTE — [facility] Group Notes (Signed)
Pt keeps to himself, pleasant, kind, alert and respectful. No new complaints.

## 2022-03-19 NOTE — Progress Notes (Signed)
Recreation Therapy Notes  Date: 03/19/2022   Time: 2:30pm     Location: Court yard     Behavioral response: N/A   Intervention Topic: Strengths     Discussion/Intervention: Patient refused to attend group.    Clinical Observations/Feedback:  Patient refused to attend group.    Shondell Poulson LRT/CTRS        Brantleigh Mifflin 03/19/2022 2:36 PM

## 2022-03-19 NOTE — Plan of Care (Signed)
Patient remain alert with confusion, calm and cooperative during assessment. Denies SI, HI, and AVH. Denies anxiety and depression. Denies pain or discomfort at this time. Compliant with medications. Ate meals in the day room among peers with good appetite. No apparent distress noted at this time.  Problem: Education: Goal: Utilization of techniques to improve thought processes will improve Outcome: Progressing Goal: Knowledge of the prescribed therapeutic regimen will improve Outcome: Progressing   Problem: Activity: Goal: Interest or engagement in leisure activities will improve Outcome: Progressing Goal: Imbalance in normal sleep/wake cycle will improve Outcome: Progressing   Problem: Coping: Goal: Coping ability will improve Outcome: Progressing Goal: Will verbalize feelings Outcome: Progressing   Problem: Health Behavior/Discharge Planning: Goal: Ability to make decisions will improve Outcome: Progressing Goal: Compliance with therapeutic regimen will improve Outcome: Progressing   Problem: Role Relationship: Goal: Will demonstrate positive changes in social behaviors and relationships Outcome: Progressing   Problem: Safety: Goal: Ability to disclose and discuss suicidal ideas will improve Outcome: Progressing Goal: Ability to identify and utilize support systems that promote safety will improve Outcome: Progressing   Problem: Self-Concept: Goal: Will verbalize positive feelings about self Outcome: Progressing Goal: Level of anxiety will decrease Outcome: Progressing   Problem: Education: Goal: Knowledge of  General Education information/materials will improve Outcome: Progressing Goal: Emotional status will improve Outcome: Progressing Goal: Mental status will improve Outcome: Progressing Goal: Verbalization of understanding the information provided will improve Outcome: Progressing   Problem: Activity: Goal: Interest or engagement in activities  will improve Outcome: Progressing Goal: Sleeping patterns will improve Outcome: Progressing   Problem: Coping: Goal: Ability to verbalize frustrations and anger appropriately will improve Outcome: Progressing Goal: Ability to demonstrate self-control will improve Outcome: Progressing   Problem: Health Behavior/Discharge Planning: Goal: Identification of resources available to assist in meeting health care needs will improve Outcome: Progressing Goal: Compliance with treatment plan for underlying cause of condition will improve Outcome: Progressing   Problem: Physical Regulation: Goal: Ability to maintain clinical measurements within normal limits will improve Outcome: Progressing   Problem: Safety: Goal: Periods of time without injury will increase Outcome: Progressing

## 2022-03-20 ENCOUNTER — Other Ambulatory Visit: Payer: Self-pay

## 2022-03-20 DIAGNOSIS — F332 Major depressive disorder, recurrent severe without psychotic features: Secondary | ICD-10-CM | POA: Diagnosis not present

## 2022-03-20 DIAGNOSIS — I6523 Occlusion and stenosis of bilateral carotid arteries: Secondary | ICD-10-CM

## 2022-03-20 NOTE — Plan of Care (Signed)
Patient remain alert with some confusion, calm and cooperative during assessment. Denies SI, HI, and AVH. Endorsed anxiety of 7/10 and depression of 3/10. Denies pain or discomfort at this time. Ate meals in the day room among peers and tolerated well. Remain safe on the unit.  Problem: Education: Goal: Utilization of techniques to improve thought processes will improve Outcome: Progressing Goal: Knowledge of the prescribed therapeutic regimen will improve Outcome: Progressing   Problem: Activity: Goal: Interest or engagement in leisure activities will improve Outcome: Progressing Goal: Imbalance in normal sleep/wake cycle will improve Outcome: Progressing   Problem: Coping: Goal: Coping ability will improve Outcome: Progressing Goal: Will verbalize feelings Outcome: Progressing   Problem: Health Behavior/Discharge Planning: Goal: Ability to make decisions will improve Outcome: Progressing Goal: Compliance with therapeutic regimen will improve Outcome: Progressing   Problem: Role Relationship: Goal: Will demonstrate positive changes in social behaviors and relationships Outcome: Progressing   Problem: Safety: Goal: Ability to disclose and discuss suicidal ideas will improve Outcome: Progressing Goal: Ability to identify and utilize support systems that promote safety will improve Outcome: Progressing   Problem: Self-Concept: Goal: Will verbalize positive feelings about self Outcome: Progressing Goal: Level of anxiety will decrease Outcome: Progressing   Problem: Education: Goal: Knowledge of [facility] Education information/materials will improve Outcome: Progressing Goal: Emotional status will improve Outcome: Progressing Goal: Mental status will improve Outcome: Progressing Goal: Verbalization of understanding the information provided will improve Outcome: Progressing   Problem: Activity: Goal: Interest or engagement in activities will improve Outcome:  Progressing Goal: Sleeping patterns will improve Outcome: Progressing   Problem: Coping: Goal: Ability to verbalize frustrations and anger appropriately will improve Outcome: Progressing Goal: Ability to demonstrate self-control will improve Outcome: Progressing   Problem: Health Behavior/Discharge Planning: Goal: Identification of resources available to assist in meeting health care needs will improve Outcome: Progressing Goal: Compliance with treatment plan for underlying cause of condition will improve Outcome: Progressing   Problem: Physical Regulation: Goal: Ability to maintain clinical measurements within normal limits will improve Outcome: Progressing   Problem: Safety: Goal: Periods of time without injury will increase Outcome: Progressing

## 2022-03-20 NOTE — BH IP Treatment Plan (Signed)
Interdisciplinary Treatment and Diagnostic Plan Update  03/20/2022 Time of Session: 9:30AM John [name] MRN: [id number]  Principal Diagnosis: MDD (major depressive disorder)  Secondary Diagnoses: Principal Problem:   MDD (major depressive disorder)   Current Medications:  Current Facility-Administered Medications  Medication Dose Route Frequency Provider Last Rate Last Admin   acetaminophen (TYLENOL) tablet 650 mg  650 mg Oral Q6H PRN Sherlon Handing, NP       alum & mag hydroxide-simeth (MAALOX/MYLANTA) 200-200-20 MG/5ML suspension 30 mL  30 mL Oral Q4H PRN Waldon Merl F, NP       atenolol (TENORMIN) tablet 50 mg  50 mg Oral Daily Waldon Merl F, NP   50 mg at 03/20/22 2706   doxepin (SINEQUAN) capsule 25 mg  25 mg Oral QHS Parks Ranger, DO   25 mg at 03/19/22 2159   glipiZIDE (GLUCOTROL) tablet 5 mg  5 mg Oral BID Caroline Sauger, NP   5 mg at 03/20/22 2376   LORazepam (ATIVAN) tablet 0.5 mg  0.5 mg Oral Q4H PRN Parks Ranger, DO   0.5 mg at 03/20/22 2831   magnesium hydroxide (MILK OF MAGNESIA) suspension 30 mL  30 mL Oral Daily PRN Waldon Merl F, NP   30 mL at 03/15/22 1003   risperiDONE (RISPERDAL) tablet 0.5 mg  0.5 mg Oral BH-q8a4p Parks Ranger, DO   0.5 mg at 03/20/22 5176   tamsulosin (FLOMAX) capsule 0.4 mg  0.4 mg Oral Q2200 Caroline Sauger, NP   0.4 mg at 03/19/22 2159   venlafaxine XR (EFFEXOR-XR) 24 hr capsule 225 mg  225 mg Oral QPC breakfast Parks Ranger, DO   225 mg at 03/20/22 1607   PTA Medications: Facility-Administered Medications Prior to Admission  Medication Dose Route Frequency Provider Last Rate Last Admin   gentamicin (GARAMYCIN) injection 80 mg  80 mg Intramuscular Once Hollice Espy, MD       levofloxacin [facility]) tablet 500 mg  500 mg Oral Once Hollice Espy, MD       Medications Prior to Admission  Medication Sig Dispense Refill Last Dose   aspirin EC 81 MG tablet Take 81 mg by  mouth daily.      atenolol (TENORMIN) 50 MG tablet Take 50 mg by mouth daily.  0    buPROPion ER (WELLBUTRIN SR) 100 MG 12 hr tablet Take 100 mg by mouth 2 (two) times daily.      glipiZIDE (GLUCOTROL) 5 MG tablet Take 10 mg by mouth 2 (two) times daily before a meal.      oxyCODONE-acetaminophen (PERCOCET) 5-325 MG tablet Take 1 tablet by mouth every 4 (four) hours as needed for severe pain. (Patient not taking: Reported on 03/12/2022) 20 tablet 0    SUMAtriptan (IMITREX) 100 MG tablet TAKE 1 TABLET BY MOUTH ONCE AS NEEDED FOR MIGRAINE. MAY REPEAT 2ND DOSE AFTER 2 HOUR IF NEEDED      tamsulosin (FLOMAX) 0.4 MG CAPS capsule TAKE 1 CAPSULE (0.4 MG TOTAL) BY MOUTH DAILY AFTER SUPPER. 90 capsule 3    venlafaxine (EFFEXOR) 37.5 MG tablet Take 75 mg by mouth daily.       Patient Stressors: Other: vision issues after surgery.    Patient Strengths: Work Software engineer Modalities: Medication Management, Group therapy, Case management,  1 to 1 session with clinician, Psychoeducation, Recreational therapy.   Physician Treatment Plan for Primary Diagnosis: MDD (major depressive disorder) Long Term Goal(s): Improvement in symptoms so as ready for discharge   Short  Term Goals: Ability to identify changes in lifestyle to reduce recurrence of condition will improve Ability to verbalize feelings will improve Ability to disclose and discuss suicidal ideas Ability to demonstrate self-control will improve Ability to identify and develop effective coping behaviors will improve Ability to maintain clinical measurements within normal limits will improve Compliance with prescribed medications will improve Ability to identify triggers associated with substance abuse/mental health issues will improve  Medication Management: Evaluate patient's response, side effects, and tolerance of medication regimen.  Therapeutic Interventions: 1 to 1 sessions, Unit Group sessions and Medication  administration.  Evaluation of Outcomes: Adequate for Discharge  Physician Treatment Plan for Secondary Diagnosis: Principal Problem:   MDD (major depressive disorder)  Long Term Goal(s): Improvement in symptoms so as ready for discharge   Short Term Goals: Ability to identify changes in lifestyle to reduce recurrence of condition will improve Ability to verbalize feelings will improve Ability to disclose and discuss suicidal ideas Ability to demonstrate self-control will improve Ability to identify and develop effective coping behaviors will improve Ability to maintain clinical measurements within normal limits will improve Compliance with prescribed medications will improve Ability to identify triggers associated with substance abuse/mental health issues will improve     Medication Management: Evaluate patient's response, side effects, and tolerance of medication regimen.  Therapeutic Interventions: 1 to 1 sessions, Unit Group sessions and Medication administration.  Evaluation of Outcomes: Adequate for Discharge   RN Treatment Plan for Primary Diagnosis: MDD (major depressive disorder) Long Term Goal(s): Knowledge of disease and therapeutic regimen to maintain health will improve  Short Term Goals: Ability to remain free from injury will improve, Ability to verbalize frustration and anger appropriately will improve, Ability to demonstrate self-control, Ability to participate in decision making will improve, Ability to verbalize feelings will improve, Ability to disclose and discuss suicidal ideas, Ability to identify and develop effective coping behaviors will improve, and Compliance with prescribed medications will improve  Medication Management: RN will administer medications as ordered by provider, will assess and evaluate patient's response and provide education to patient for prescribed medication. RN will report any adverse and/or side effects to prescribing  provider.  Therapeutic Interventions: 1 on 1 counseling sessions, Psychoeducation, Medication administration, Evaluate responses to treatment, Monitor vital signs and CBGs as ordered, Perform/monitor CIWA, COWS, AIMS and Fall Risk screenings as ordered, Perform wound care treatments as ordered.  Evaluation of Outcomes: Adequate for Discharge   LCSW Treatment Plan for Primary Diagnosis: MDD (major depressive disorder) Long Term Goal(s): Safe transition to appropriate next level of care at discharge, Engage patient in therapeutic group addressing interpersonal concerns.  Short Term Goals: Engage patient in aftercare planning with referrals and resources, Increase social support, Increase ability to appropriately verbalize feelings, Increase emotional regulation, Facilitate acceptance of mental health diagnosis and concerns, Identify triggers associated with mental health/substance abuse issues, and Increase skills for wellness and recovery  Therapeutic Interventions: Assess for all discharge needs, 1 to 1 time with Social worker, Explore available resources and support systems, Assess for adequacy in community support network, Educate family and significant other(s) on suicide prevention, Complete Psychosocial Assessment, Interpersonal group therapy.  Evaluation of Outcomes: Adequate for Discharge   Progress in Treatment: Attending groups: Yes. Participating in groups: Yes. Taking medication as prescribed: Yes. Toleration medication: Yes. Family/Significant other contact made: Yes, individual(s) contacted:   SPE completetd with [name], pt's wife Patient understands diagnosis: Yes. Discussing patient identified problems/goals with staff: Yes. Medical problems stabilized or resolved: Yes. Denies suicidal/homicidal  ideation: Yes. Issues/concerns per patient self-inventory: No. Other: None  New problem(s) identified: No, Describe:  None  New Short Term/Long Term Goal(s): Patient to  work towards, medication management for mood stabilization; elimination of SI thoughts; development of comprehensive mental wellness plan. Update 03/20/22: No changes at this time.     Patient Goals: "work on my depression and anxiety"   Update 03/20/22: No changes at this time.    Discharge Plan or Barriers: CSW will assist pt with development of appropriate discharge/aftercare plan. No psychosocial barriers identified regarding pt's ability to return home. Update 03/20/22: No changes at this time.    Reason for Continuation of Hospitalization: Anxiety Depression Medication stabilization   Estimated Length of Stay: TBD    Last 3 [place] Suicide Severity Risk Score: [place] Admission (Current) from 03/13/2022 in [facility] from 03/12/2022 in [place] Admission (Discharged) from 01/02/2022 in [place] No Risk No Risk No Risk       Last PHQ 2/9 Scores:    12/21/2021    8:59 AM 04/22/2017   10:46 AM  Depression screen PHQ 2/9  Decreased Interest 0 0  Down, Depressed, Hopeless 0 0  PHQ - 2 Score 0 0    Scribe for Treatment Team: Zaria Taha A [place], [place] 03/20/2022 1:02 PM

## 2022-03-20 NOTE — Progress Notes (Signed)
[facility] MD Progress Note  03/20/2022 10:38 AM John [name]  MRN:  [id number] Subjective: John [name] is seen on rounds.  He states that he slept better last night but he is anxious in the morning.  He just took 0.5 mg of Ativan.  He does look very anxious.  I am wondering if it could be a side effect from the doxepin in which case may end up switching it.  He states that he worries a lot, still feels depressed, and complains of memory problems.  He denies suicidal ideation.  Principal Problem: MDD (major depressive disorder) Diagnosis: Principal Problem:   MDD (major depressive disorder)  Total Time spent with patient: 15 minutes  Past Psychiatric History: Patient has long history of anxiety and depression, which he identifies as stemming from a head injury at 81 years of age.  He has been functioning very well throughout his life.  He is a retired Chief Financial Officer.  He has been on a low-dose of venlafaxine for many years.  He has had a few hospitalizations, the last being in the 1990s.  The only suicide attempt that he describes is putting a bag over his head many years ago, but then he took it off.  He sees his primary care physician for medication management and has had a therapist for many years and knew "spiritual director" for the last year and a half, with whom he has a good relationship.  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   Eczema    Elevated PSA    H/O Osgood-Schlatter disease    Hand deformity, congenital    Hearing loss bilateral   Hemorrhoids    Horner syndrome    Hypertension    Migraine    Obesity    PONV (postoperative nausea and vomiting)    Prostate cancer ([place]) 2020   Rosacea    Sleep apnea     Past Surgical History:  Procedure Laterality Date   ANKLE ARTHROSCOPY Left    fracture   CHOLECYSTECTOMY  2007    CRANIOTOMY  1958   left frontal craniotomy for epidural hematoma   INSERTION OF MESH  01/02/2022   Procedure: INSERTION OF MESH;  Surgeon: Herbert Pun, MD;  Location: [facility] ORS;  Service: General;;   LIGAMENT REPAIR Right    thumb   RETINAL DETACHMENT SURGERY     TREATMENT FISTULA ANAL  2006   Family History:  Family History  Problem Relation Age of Onset   Osteoporosis Mother    Depression Mother    Prostate cancer Father    Hypertension Father    Diabetes Brother    Kidney disease Neg Hx     Social History:  Social History   Substance and Sexual Activity  Alcohol Use Not Currently     Social History   Substance and Sexual Activity  Drug Use No    Social History   Socioeconomic History   Marital status: Married    Spouse name: [name]   Number of children: 2   Years of education: Not on file   Highest education level: Not on file  Occupational History   Not on file  Tobacco Use   Smoking status: Never   Smokeless tobacco: Never  Vaping Use   Vaping Use: Never used  Substance and  Sexual Activity   Alcohol use: Not Currently   Drug use: No   Sexual activity: Yes    Birth control/protection: None  Other Topics Concern   Not on file  Social History Narrative   Live at home with wife.    Social Determinants of Health   Financial Resource Strain: Not on file  Food Insecurity: Not on file  Transportation Needs: Not on file  Physical Activity: Not on file  Stress: Not on file  Social Connections: Not on file   Additional Social History:                         Sleep: Good  Appetite:  Good  Current Medications: Current Facility-Administered Medications  Medication Dose Route Frequency Provider Last Rate Last Admin   acetaminophen (TYLENOL) tablet 650 mg  650 mg Oral Q6H PRN Sherlon Handing, NP       alum & mag hydroxide-simeth (MAALOX/MYLANTA) 200-200-20 MG/5ML suspension 30 mL  30 mL Oral Q4H PRN Sherlon Handing, NP        atenolol (TENORMIN) tablet 50 mg  50 mg Oral Daily Waldon Merl F, NP   50 mg at 03/20/22 1962   doxepin (SINEQUAN) capsule 25 mg  25 mg Oral QHS Parks Ranger, DO   25 mg at 03/19/22 2159   glipiZIDE (GLUCOTROL) tablet 5 mg  5 mg Oral BID Caroline Sauger, NP   5 mg at 03/20/22 2297   LORazepam (ATIVAN) tablet 0.5 mg  0.5 mg Oral Q4H PRN Parks Ranger, DO   0.5 mg at 03/20/22 9892   magnesium hydroxide (MILK OF MAGNESIA) suspension 30 mL  30 mL Oral Daily PRN Sherlon Handing, NP   30 mL at 03/15/22 1003   risperiDONE (RISPERDAL) tablet 0.5 mg  0.5 mg Oral BH-q8a4p Parks Ranger, DO   0.5 mg at 03/20/22 1194   tamsulosin (FLOMAX) capsule 0.4 mg  0.4 mg Oral Q2200 Caroline Sauger, NP   0.4 mg at 03/19/22 2159   venlafaxine XR (EFFEXOR-XR) 24 hr capsule 225 mg  225 mg Oral QPC breakfast Parks Ranger, DO   225 mg at 03/20/22 1740    Lab Results:  Results for orders placed or performed during the hospital encounter of 03/13/22 (from the past 48 hour(s))  Hemoglobin A1c     Status: None   Collection Time: 03/19/22  7:01 AM  Result Value Ref Range   Hgb A1c MFr Bld 5.6 4.8 - 5.6 %    Comment: (NOTE) Pre diabetes:          5.7%-6.4%  Diabetes:              >6.4%  Glycemic control for   <7.0% adults with diabetes    Mean Plasma Glucose 114.02 mg/dL    Comment: Performed at [facility], [place] [address], [place], [place] [address]  Lipid panel     Status: Abnormal   Collection Time: 03/19/22  7:01 AM  Result Value Ref Range   Cholesterol 131 0 - 200 mg/dL   Triglycerides 92 <150 mg/dL   HDL 33 (L) >40 mg/dL   Total CHOL/HDL Ratio 4.0 RATIO   VLDL 18 0 - 40 mg/dL   LDL Cholesterol 80 0 - 99 mg/dL    Comment:        Total Cholesterol/HDL:CHD Risk Coronary Heart Disease Risk Table  Men   Women  1/2 Average Risk   3.4   3.3  Average Risk       5.0   4.4  2 X Average Risk   9.6   7.1  3 X Average Risk  23.4    11.0        Use the calculated Patient Ratio above and the CHD Risk Table to determine the patient's CHD Risk.        ATP III CLASSIFICATION (LDL):  <100     mg/dL   Optimal  100-129  mg/dL   Near or Above                    Optimal  130-159  mg/dL   Borderline  160-189  mg/dL   High  >190     mg/dL   Very High Performed at [facility], [address] [place], [place] [address]     Blood Alcohol level:  Lab Results  Component Value Date   [facility] <10 45/80/9983    Metabolic Disorder Labs: Lab Results  Component Value Date   HGBA1C 5.6 03/19/2022   MPG 114.02 03/19/2022   No results found for: PROLACTIN Lab Results  Component Value Date   CHOL 131 03/19/2022   TRIG 92 03/19/2022   HDL 33 (L) 03/19/2022   CHOLHDL 4.0 03/19/2022   VLDL 18 03/19/2022   LDLCALC 80 03/19/2022    Physical Findings: AIMS:  , ,  ,  ,    CIWA:    COWS:     Musculoskeletal: Strength & Muscle Tone: within normal limits Gait & Station: normal Patient leans: N/A  Psychiatric Specialty Exam:  Presentation  General Appearance: Appropriate for Environment  Eye Contact:Good  Speech:Clear and Coherent  Speech Volume:Normal  Handedness:No data recorded  Mood and Affect  Mood:Depressed  Affect:Congruent   Thought Process  Thought Processes:Coherent  Descriptions of Associations:Intact  Orientation:Full (Time, Place and Person)  Thought Content:Logical  History of Schizophrenia/Schizoaffective disorder:No  Duration of Psychotic Symptoms:No data recorded Hallucinations:No data recorded Ideas of Reference:None  Suicidal Thoughts:No data recorded Homicidal Thoughts:No data recorded  Sensorium  Memory:Immediate Good; Recent Good; Remote Good  Judgment:No data recorded Insight:Good   Executive Functions  Concentration:Good  Attention Span:Good  [place] of Knowledge:Good  Language:Good   Psychomotor Activity  Psychomotor Activity:No data  recorded  Assets  Assets:Desire for Improvement; Armed forces logistics/support/administrative officer; Financial Resources/Insurance; Housing; Intimacy; Social Support; Resilience; Physical Health   Sleep  Sleep:No data recorded   Physical Exam: Physical Exam Vitals and nursing note reviewed.  Constitutional:      Appearance: Normal appearance. He is normal weight.  Neurological:     General: No focal deficit present.     Mental Status: He is alert and oriented to person, place, and time.  Psychiatric:        Attention and Perception: Attention and perception normal.        Mood and Affect: Mood is anxious and depressed.        Speech: Speech normal.        Behavior: Behavior normal. Behavior is cooperative.        Thought Content: Thought content normal.        Cognition and Memory: Cognition and memory normal.        Judgment: Judgment normal.   Review of Systems  Constitutional: Negative.   HENT: Negative.    Eyes: Negative.   Respiratory: Negative.    Cardiovascular: Negative.  Gastrointestinal: Negative.   Genitourinary: Negative.   Musculoskeletal: Negative.   Skin: Negative.   Neurological: Negative.   Endo/Heme/Allergies: Negative.   Psychiatric/Behavioral:  Positive for depression. The patient is nervous/anxious and has insomnia.   Blood pressure (!) 149/90, pulse 78, temperature (!) 97.5 F (36.4 C), temperature source Oral, resp. rate 18, SpO2 100 %. There is no height or weight on file to calculate BMI.   Treatment Plan Summary: Daily contact with patient to assess and evaluate symptoms and progress in treatment, Medication management, and Plan continue current medications consider changing doxepin to Seroquel based on how he does today.  Parks Ranger, DO 03/20/2022, 10:38 AM

## 2022-03-20 NOTE — Progress Notes (Signed)
   03/20/22 1945  Psych Admission Type (Psych Patients Only)  Admission Status Voluntary  Psychosocial Assessment  Patient Complaints Anxiety;Depression;Worrying  Eye Contact Brief  Facial Expression Animated;Sad  Affect Anxious;Depressed  Speech Logical/coherent  Interaction Assertive  Motor Activity Slow  Appearance/Hygiene In scrubs  Behavior Characteristics Cooperative;Anxious  Mood Depressed;Anxious;Sad  Thought Process  Coherency WDL  Content WDL  Delusions None reported or observed  Perception WDL  Hallucination None reported or observed  Judgment WDL  Confusion None  Danger to Self  Current suicidal ideation? Denies  Danger to Others  Danger to Others None reported or observed   Pt seen in dayroom. Pt denies SI, HI, AVH and pain. Pt is speaking with one of his peers. Pt rates his anxiety 4/10 and his depression 4/10. Pt states that he is carrying some of the worry that his wife has about being alone. His wife is concerned that pt will die at some point and she will not be able to take care of herself. "I think that if I can get some things done, then my anxiety may get better." Pt says he is in good health but part of the worry is about the future. Pt says that in the past, he has busied himself with hobbies when his anxiety increased. "I pace around the house or work on an Special educational needs teacher." Pt States that he was seeing someone in the church to talk to for spiritual guidance and that he is happy for that to continue when he is discharged. "I also think I need a psychotherapist. I see Shaveen (?) Marshell Levan and she recommended another therapist but that person hasn't gotten in touch with me." Pt states that his anxiety has decreased during this assessment. Pt wants therapy after discharge in conjunction with his medications. Pt's goal is to get control of his anxiety. Pt tolerating medications. C/O dry mouth and feeling like thinking is cloudy at different times during the day. Pt  says he has trouble focusing. Pt given scheduled medications as prescribed. PRNs as appropriate. Q15 minute checks for safety. Pt safe on unit.

## 2022-03-20 NOTE — Progress Notes (Signed)
Recreation Therapy Notes  Date: 05/242023   Time: 1:30pm     Location: Craft room      Behavioral response: N/A   Intervention Topic:  Decision Making    Discussion/Intervention: Group unable to be held at this time due to another group taking place.    Clinical Observations/Feedback:  Group unable to be held at this time due to another group taking place.   Kristy Catoe LRT/CTRS          Heston Widener 03/20/2022 2:25 PM

## 2022-03-21 DIAGNOSIS — F332 Major depressive disorder, recurrent severe without psychotic features: Secondary | ICD-10-CM | POA: Diagnosis not present

## 2022-03-21 MED ORDER — VENLAFAXINE HCL ER 75 MG PO CP24
300.0000 mg | ORAL_CAPSULE | Freq: Every day | ORAL | Status: DC
  Administered 2022-03-22 – 2022-03-31 (×10): 300 mg via ORAL
  Filled 2022-03-21 (×10): qty 4

## 2022-03-21 MED ORDER — QUETIAPINE FUMARATE 25 MG PO TABS
50.0000 mg | ORAL_TABLET | Freq: Every day | ORAL | Status: DC
  Administered 2022-03-21 – 2022-03-22 (×2): 50 mg via ORAL
  Filled 2022-03-21 (×2): qty 2

## 2022-03-21 MED ORDER — GABAPENTIN 100 MG PO CAPS
100.0000 mg | ORAL_CAPSULE | Freq: Three times a day (TID) | ORAL | Status: DC
  Administered 2022-03-21 – 2022-03-22 (×4): 100 mg via ORAL
  Filled 2022-03-21 (×4): qty 1

## 2022-03-21 NOTE — Progress Notes (Signed)
Patient remains anxious, sad and depressed, he was cooperative with treatment and complaint with medication. Given PRN Ativan with effect.

## 2022-03-21 NOTE — Progress Notes (Signed)
Recreation Therapy Notes   Date: 03/21/2022   Time: 1:35pm      Location: Craft room      Behavioral response: N/A   Intervention Topic: Wellness    Discussion/Intervention: Patient refused to attend group.    Clinical Observations/Feedback:  Patient refused to attend group.    Bernadine Melecio LRT/CTRS        Ashantae Pangallo 03/21/2022 2:27 PM

## 2022-03-21 NOTE — Plan of Care (Signed)
  Problem: Group Participation Goal: STG - Patient will engage in groups without prompting or encouragement from LRT x3 group sessions within 5 recreation therapy group sessions Description: STG - Patient will engage in groups without prompting or encouragement from LRT x3 group sessions within 5 recreation therapy group sessions Outcome: Not Progressing   

## 2022-03-21 NOTE — Progress Notes (Addendum)
   03/21/22 2110  Psych Admission Type (Psych Patients Only)  Admission Status Voluntary  Psychosocial Assessment  Patient Complaints Anxiety  Eye Contact Fair  Facial Expression Anxious;Flat  Affect Anxious  Speech Logical/coherent  Interaction Assertive  Motor Activity Slow  Appearance/Hygiene In scrubs  Behavior Characteristics Cooperative;Appropriate to situation;Anxious  Mood Anxious;Sad  Thought Process  Content WDL  Delusions None reported or observed  Perception WDL  Hallucination None reported or observed  Judgment WDL  Confusion None  Danger to Self  Current suicidal ideation? Denies  Danger to Others  Danger to Others None reported or observed   Pt seen in dayroom interacting with peers. Pt eating well and sleeping well. Pt denies SI, HI, AVH and pain. Pt rates anxiety 2/10. Pt denies depression. Pt started on gabapentin today. Pt states that his anxiety is not as bad but that he has spikes when his anxiety increases. Pt has a spike at lunchtime d/t the fact that he is uncomfortable with the group eating scenario. He is worried that anxiety spikes will occur if he is discharged tomorrow. Pt states that he spoke with provider and will stay inpatient throughout the weekend to see if medication changes will affect these spikes. Pt started on Seroquel tonight instead of doxepin. Pt Effexor will be increased in the morning. Pt encouraged to see if the current med changes work for his anxiety. If he has a spike, the Ativan can be given to him at that time. Pt verbalized agreement.  Pt given scheduled medications as prescribed. PRNs as appropriate. Q15 minute checks for safety.  Pt safe on unit.

## 2022-03-21 NOTE — Progress Notes (Signed)
[facility] MD Progress Note  03/21/2022 11:20 AM John [name]  MRN:  [id number] Subjective: John [name] is seen on rounds.  He states that he slept well but he is still having a lot of anxiety.  He took Ativan last night and this morning.  He states he feels better than yesterday but still very depressed and anxious.  I told him I think that he is waking up anxious could be related to his doxepin and he does not seem to be getting much relief from the Risperdal.  I told we will go ahead and make some medication changes if he can stay a little bit longer we can get him feeling better hopefully.  Principal Problem: MDD (major depressive disorder) Diagnosis: Principal Problem:   MDD (major depressive disorder)  Total Time spent with patient: 15 minutes  Past Psychiatric History: Patient has long history of anxiety and depression, which he identifies as stemming from a head injury at 81 years of age.  He has been functioning very well throughout his life.  He is a retired Chief Financial Officer.  He has been on a low-dose of venlafaxine for many years.  He has had a few hospitalizations, the last being in the 1990s.  The only suicide attempt that he describes is putting a bag over his head many years ago, but then he took it off.  He sees his primary care physician for medication management and has had a therapist for many years and knew "spiritual director" for the last year and a half, with whom he has a good relationship.  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   Eczema    Elevated PSA    H/O Osgood-Schlatter disease    Hand deformity, congenital    Hearing loss bilateral   Hemorrhoids    Horner syndrome    Hypertension    Migraine    Obesity    PONV (postoperative nausea and vomiting)    Prostate cancer ([place]) 2020   Rosacea    Sleep apnea      Past Surgical History:  Procedure Laterality Date   ANKLE ARTHROSCOPY Left    fracture   CHOLECYSTECTOMY  2007   CRANIOTOMY  1958   left frontal craniotomy for epidural hematoma   INSERTION OF MESH  01/02/2022   Procedure: INSERTION OF MESH;  Surgeon: Herbert Pun, MD;  Location: [facility] ORS;  Service: General;;   LIGAMENT REPAIR Right    thumb   RETINAL DETACHMENT SURGERY     TREATMENT FISTULA ANAL  2006   Family History:  Family History  Problem Relation Age of Onset   Osteoporosis Mother    Depression Mother    Prostate cancer Father    Hypertension Father    Diabetes Brother    Kidney disease Neg Hx     Social History:  Social History   Substance and Sexual Activity  Alcohol Use Not Currently     Social History   Substance and Sexual Activity  Drug Use No    Social History   Socioeconomic History   Marital status: Married    Spouse name: [name]   Number of children: 2   Years of education: Not on file   Highest education level: Not on file  Occupational History   Not on file  Tobacco Use   Smoking status: Never   Smokeless tobacco: Never  Vaping Use   Vaping Use: Never used  Substance and Sexual Activity   Alcohol use: Not Currently   Drug use: No   Sexual activity: Yes    Birth control/protection: None  Other Topics Concern   Not on file  Social History Narrative   Live at home with wife.    Social Determinants of Health   Financial Resource Strain: Not on file  Food Insecurity: Not on file  Transportation Needs: Not on file  Physical Activity: Not on file  Stress: Not on file  Social Connections: Not on file   Additional Social History:                         Sleep: Good  Appetite:  Good  Current Medications: Current Facility-Administered Medications  Medication Dose Route Frequency Provider Last Rate Last Admin   acetaminophen (TYLENOL) tablet 650 mg  650 mg Oral Q6H PRN Sherlon Handing, NP       alum & mag  hydroxide-simeth (MAALOX/MYLANTA) 200-200-20 MG/5ML suspension 30 mL  30 mL Oral Q4H PRN Waldon Merl F, NP       atenolol (TENORMIN) tablet 50 mg  50 mg Oral Daily Waldon Merl F, NP   50 mg at 03/21/22 1011   gabapentin (NEURONTIN) capsule 100 mg  100 mg Oral TID Parks Ranger, DO   100 mg at 03/21/22 1059   glipiZIDE (GLUCOTROL) tablet 5 mg  5 mg Oral BID Caroline Sauger, NP   5 mg at 03/21/22 1011   LORazepam (ATIVAN) tablet 0.5 mg  0.5 mg Oral Q4H PRN Parks Ranger, DO   0.5 mg at 03/21/22 0531   magnesium hydroxide (MILK OF MAGNESIA) suspension 30 mL  30 mL Oral Daily PRN Waldon Merl F, NP   30 mL at 03/21/22 1059   QUEtiapine (SEROQUEL) tablet 50 mg  50 mg Oral QHS Parks Ranger, DO       tamsulosin [facility]) capsule 0.4 mg  0.4 mg Oral Q2200 Caroline Sauger, NP   0.4 mg at 03/20/22 2111   [START ON 03/22/2022] venlafaxine XR (EFFEXOR-XR) 24 hr capsule 300 mg  300 mg Oral QPC breakfast Parks Ranger, DO        Lab Results: No results found for this or any previous visit (from the past 48 hour(s)).  Blood Alcohol level:  Lab Results  Component Value Date   ETH <10 58/06/9832    Metabolic Disorder Labs: Lab Results  Component Value Date   HGBA1C 5.6 03/19/2022   MPG 114.02 03/19/2022   No results found for: PROLACTIN Lab Results  Component Value Date   CHOL 131 03/19/2022   TRIG 92 03/19/2022   HDL 33 (L) 03/19/2022   CHOLHDL 4.0 03/19/2022   VLDL 18 03/19/2022   LDLCALC 80 03/19/2022    Physical Findings: AIMS:  , ,  ,  ,    CIWA:    COWS:     Musculoskeletal: Strength & Muscle Tone: within normal limits Gait & Station: normal Patient leans: N/A  Psychiatric Specialty Exam:  Presentation  General Appearance: Appropriate for Environment  Eye Contact:Good  Speech:Clear and Coherent  Speech Volume:Normal  Handedness:No data recorded  Mood and Affect   Mood:Depressed  Affect:Congruent   Thought Process  Thought Processes:Coherent  Descriptions of Associations:Intact  Orientation:Full (Time, Place and Person)  Thought Content:Logical  History of Schizophrenia/Schizoaffective disorder:No  Duration of Psychotic Symptoms:No data recorded Hallucinations:No data recorded Ideas of Reference:None  Suicidal Thoughts:No data recorded Homicidal Thoughts:No data recorded  Sensorium  Memory:Immediate Good; Recent Good; Remote Good  Judgment:No data recorded Insight:Good   Executive Functions  Concentration:Good  Attention Span:Good  [place]  Language:Good   Psychomotor Activity  Psychomotor Activity:No data recorded  Assets  Assets:Desire for Improvement; Armed forces logistics/support/administrative officer; Financial Resources/Insurance; Housing; Intimacy; Social Support; Resilience; Physical Health   Sleep  Sleep:No data recorded   Physical Exam: Physical Exam Vitals and nursing note reviewed.  Constitutional:      Appearance: Normal appearance. He is normal weight.  Neurological:     General: No focal deficit present.     Mental Status: He is alert and oriented to person, place, and time.  Psychiatric:        Attention and Perception: Attention and perception normal.        Mood and Affect: Mood is depressed. Affect is flat.        Speech: Speech normal.        Behavior: Behavior normal. Behavior is cooperative.        Thought Content: Thought content normal.        Cognition and Memory: Cognition is impaired. Memory is impaired.        Judgment: Judgment normal.   Review of Systems  Constitutional: Negative.   HENT: Negative.    Eyes: Negative.   Respiratory: Negative.    Cardiovascular: Negative.   Gastrointestinal: Negative.   Genitourinary: Negative.   Musculoskeletal: Negative.   Skin: Negative.   Neurological: Negative.   Endo/Heme/Allergies: Negative.   Psychiatric/Behavioral:  Positive  for depression. The patient is nervous/anxious.   Blood pressure (!) 137/91, pulse 69, temperature 97.7 F (36.5 C), temperature source Oral, resp. rate 18, SpO2 99 %. There is no height or weight on file to calculate BMI.   Treatment Plan Summary: Daily contact with patient to assess and evaluate symptoms and progress in treatment, Medication management, and Plan discontinue Risperdal and doxepin.  Start Seroquel 50 mg at bedtime and Neurontin 100 mg 3 times a day and increase Effexor XR to 300 mg every morning.  Parks Ranger, DO 03/21/2022, 11:20 AM

## 2022-03-21 NOTE — Progress Notes (Signed)
Pt at nurse's station. "I can feel my anxiety beginning to creep up." Pt rates anxiety 3/10 right now. Pt given PRNs as appropriate. Will re-assess pt in 30-45 minutes.

## 2022-03-22 DIAGNOSIS — F332 Major depressive disorder, recurrent severe without psychotic features: Secondary | ICD-10-CM | POA: Diagnosis not present

## 2022-03-22 MED ORDER — GABAPENTIN 100 MG PO CAPS
200.0000 mg | ORAL_CAPSULE | Freq: Three times a day (TID) | ORAL | Status: DC
Start: 2022-03-22 — End: 2022-03-23
  Administered 2022-03-22 – 2022-03-23 (×3): 200 mg via ORAL
  Filled 2022-03-22 (×3): qty 2

## 2022-03-22 NOTE — Plan of Care (Signed)
  Problem: Education: Goal: Knowledge of the prescribed therapeutic regimen will improve Outcome: Progressing   Problem: Activity: Goal: Imbalance in normal sleep/wake cycle will improve Outcome: Progressing   Problem: Coping: Goal: Will verbalize feelings Outcome: Progressing   Problem: Health Behavior/Discharge Planning: Goal: Ability to make decisions will improve Outcome: Progressing Goal: Compliance with therapeutic regimen will improve Outcome: Progressing   Problem: Role Relationship: Goal: Will demonstrate positive changes in social behaviors and relationships Outcome: Progressing

## 2022-03-22 NOTE — Progress Notes (Signed)
[facility] MD Progress Note  03/22/2022 10:33 AM John [name]  MRN:  [id number] Subjective: Hearl states that he does feel better today.  He slept well last night and he is less anxious today.  So far, he denies any side effects from the medication changes.  I told him that the Neurontin was a pretty low dose and we could move up on it and he got a higher dose of the Effexor XR this morning.  His affect is less anxious and depressed.  Principal Problem: MDD (major depressive disorder) Diagnosis: Principal Problem:   MDD (major depressive disorder)  Total Time spent with patient: 15 minutes  Past Psychiatric History: Patient has long history of anxiety and depression, which he identifies as stemming from a head injury at 81 years of age.  He has been functioning very well throughout his life.  He is a retired Chief Financial Officer.  He has been on a low-dose of venlafaxine for many years.  He has had a few hospitalizations, the last being in the 1990s.  The only suicide attempt that he describes is putting a bag over his head many years ago, but then he took it off.  He sees his primary care physician for medication management and has had a therapist for many years and knew "spiritual director" for the last year and a half, with whom he has a good relationship.  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   Eczema    Elevated PSA    H/O Osgood-Schlatter disease    Hand deformity, congenital    Hearing loss bilateral   Hemorrhoids    Horner syndrome    Hypertension    Migraine    Obesity    PONV (postoperative nausea and vomiting)    Prostate cancer ([place]) 2020   Rosacea    Sleep apnea     Past Surgical History:  Procedure Laterality Date   ANKLE ARTHROSCOPY Left    fracture   CHOLECYSTECTOMY  2007   CRANIOTOMY  1958   left frontal  craniotomy for epidural hematoma   INSERTION OF MESH  01/02/2022   Procedure: INSERTION OF MESH;  Surgeon: Herbert Pun, MD;  Location: [facility] ORS;  Service: General;;   LIGAMENT REPAIR Right    thumb   RETINAL DETACHMENT SURGERY     TREATMENT FISTULA ANAL  2006   Family History:  Family History  Problem Relation Age of Onset   Osteoporosis Mother    Depression Mother    Prostate cancer Father    Hypertension Father    Diabetes Brother    Kidney disease Neg Hx     Social History:  Social History   Substance and Sexual Activity  Alcohol Use Not Currently     Social History   Substance and Sexual Activity  Drug Use No    Social History   Socioeconomic History   Marital status: Married    Spouse name: [name]   Number of children: 2   Years of education: Not on file   Highest education level: Not on file  Occupational History   Not on file  Tobacco Use   Smoking status: Never   Smokeless tobacco: Never  Vaping Use   Vaping Use: Never used  Substance and Sexual Activity   Alcohol use: Not  Currently   Drug use: No   Sexual activity: Yes    Birth control/protection: None  Other Topics Concern   Not on file  Social History Narrative   Live at home with wife.    Social Determinants of Health   Financial Resource Strain: Not on file  Food Insecurity: Not on file  Transportation Needs: Not on file  Physical Activity: Not on file  Stress: Not on file  Social Connections: Not on file   Additional Social History:                         Sleep: Good  Appetite:  Good  Current Medications: Current Facility-Administered Medications  Medication Dose Route Frequency Provider Last Rate Last Admin   acetaminophen (TYLENOL) tablet 650 mg  650 mg Oral Q6H PRN Sherlon Handing, NP       alum & mag hydroxide-simeth (MAALOX/MYLANTA) 200-200-20 MG/5ML suspension 30 mL  30 mL Oral Q4H PRN Waldon Merl F, NP       atenolol (TENORMIN) tablet 50 mg  50  mg Oral Daily Waldon Merl F, NP   50 mg at 03/22/22 0902   gabapentin (NEURONTIN) capsule 100 mg  100 mg Oral TID Parks Ranger, DO   100 mg at 03/22/22 0902   glipiZIDE (GLUCOTROL) tablet 5 mg  5 mg Oral BID Caroline Sauger, NP   5 mg at 03/22/22 6160   LORazepam (ATIVAN) tablet 0.5 mg  0.5 mg Oral Q4H PRN Parks Ranger, DO   0.5 mg at 03/21/22 1643   magnesium hydroxide (MILK OF MAGNESIA) suspension 30 mL  30 mL Oral Daily PRN Waldon Merl F, NP   30 mL at 03/21/22 1059   QUEtiapine (SEROQUEL) tablet 50 mg  50 mg Oral QHS Parks Ranger, DO   50 mg at 03/21/22 2119   tamsulosin (FLOMAX) capsule 0.4 mg  0.4 mg Oral Q2200 Caroline Sauger, NP   0.4 mg at 03/21/22 2119   venlafaxine XR (EFFEXOR-XR) 24 hr capsule 300 mg  300 mg Oral QPC breakfast Parks Ranger, DO   300 mg at 03/22/22 7371    Lab Results: No results found for this or any previous visit (from the past 48 hour(s)).  Blood Alcohol level:  Lab Results  Component Value Date   ETH <10 04/22/9484    Metabolic Disorder Labs: Lab Results  Component Value Date   HGBA1C 5.6 03/19/2022   MPG 114.02 03/19/2022   No results found for: PROLACTIN Lab Results  Component Value Date   CHOL 131 03/19/2022   TRIG 92 03/19/2022   HDL 33 (L) 03/19/2022   CHOLHDL 4.0 03/19/2022   VLDL 18 03/19/2022   LDLCALC 80 03/19/2022    Physical Findings: AIMS:  , ,  ,  ,    CIWA:    COWS:     Musculoskeletal: Strength & Muscle Tone: within normal limits Gait & Station: normal Patient leans: N/A  Psychiatric Specialty Exam:  Presentation  General Appearance: Appropriate for Environment  Eye Contact:Good  Speech:Clear and Coherent  Speech Volume:Normal  Handedness:No data recorded  Mood and Affect  Mood:Depressed  Affect:Congruent   Thought Process  Thought Processes:Coherent  Descriptions of Associations:Intact  Orientation:Full (Time, Place and  Person)  Thought Content:Logical  History of Schizophrenia/Schizoaffective disorder:No  Duration of Psychotic Symptoms:No data recorded Hallucinations:No data recorded Ideas of Reference:None  Suicidal Thoughts:No data recorded Homicidal Thoughts:No data recorded  Sensorium  Memory:Immediate Good; Recent  Good; Remote Good  Judgment:No data recorded Insight:Good   Executive Functions  Concentration:Good  Attention Span:Good  [place]  Language:Good   Psychomotor Activity  Psychomotor Activity:No data recorded  Assets  Assets:Desire for Improvement; Armed forces logistics/support/administrative officer; Financial Resources/Insurance; Housing; Intimacy; Social Support; Resilience; Physical Health   Sleep  Sleep:No data recorded   Physical Exam: Physical Exam Vitals and nursing note reviewed.  Constitutional:      Appearance: Normal appearance. He is normal weight.  Neurological:     General: No focal deficit present.     Mental Status: He is alert and oriented to person, place, and time.  Psychiatric:        Attention and Perception: Attention and perception normal.        Mood and Affect: Mood is anxious and depressed. Affect is flat.        Speech: Speech normal.        Behavior: Behavior normal. Behavior is cooperative.        Thought Content: Thought content normal.        Cognition and Memory: Cognition and memory normal.        Judgment: Judgment normal.   Review of Systems  Constitutional: Negative.   HENT: Negative.    Eyes: Negative.   Respiratory: Negative.    Cardiovascular: Negative.   Gastrointestinal: Negative.   Genitourinary: Negative.   Musculoskeletal: Negative.   Skin: Negative.   Neurological: Negative.   Endo/Heme/Allergies: Negative.   Psychiatric/Behavioral: Negative.    Blood pressure (!) 133/94, pulse 68, temperature 97.8 F (36.6 C), temperature source Oral, resp. rate 18, SpO2 99 %. There is no height or weight on file to  calculate BMI.   Treatment Plan Summary: Daily contact with patient to assess and evaluate symptoms and progress in treatment, Medication management, and Plan increase Neurontin to 200 mg 3 times a day.  Increase Effexor XR to 300 mg/day.  Parks Ranger, DO 03/22/2022, 10:33 AM

## 2022-03-22 NOTE — Plan of Care (Signed)
Patient remains alert and oriented calm and cooperative during assessment. Patient denies SI, HI, and AVH. Endorsed anxiety of 5/10, per patient his anxiety is because of what's going on with him right now. Denies depression. Compliant with all due medications. Ate meals in the day room among staff and peers with good appetite. Patient is currently in his room resting in no apparent distress at this time.   Problem: Education: Goal: Utilization of techniques to improve thought processes will improve Outcome: Progressing Goal: Knowledge of the prescribed therapeutic regimen will improve Outcome: Progressing   Problem: Activity: Goal: Interest or engagement in leisure activities will improve Outcome: Progressing Goal: Imbalance in normal sleep/wake cycle will improve Outcome: Progressing   Problem: Coping: Goal: Coping ability will improve Outcome: Progressing Goal: Will verbalize feelings Outcome: Progressing   Problem: Health Behavior/Discharge Planning: Goal: Ability to make decisions will improve Outcome: Progressing Goal: Compliance with therapeutic regimen will improve Outcome: Progressing   Problem: Role Relationship: Goal: Will demonstrate positive changes in social behaviors and relationships Outcome: Progressing   Problem: Safety: Goal: Ability to disclose and discuss suicidal ideas will improve Outcome: Progressing Goal: Ability to identify and utilize support systems that promote safety will improve Outcome: Progressing   Problem: Self-Concept: Goal: Will verbalize positive feelings about self Outcome: Progressing Goal: Level of anxiety will decrease Outcome: Progressing   Problem: Education: Goal: Knowledge of [facility] Education information/materials will improve Outcome: Progressing Goal: Emotional status will improve Outcome: Progressing Goal: Mental status will improve Outcome: Progressing Goal: Verbalization of understanding the information  provided will improve Outcome: Progressing   Problem: Activity: Goal: Interest or engagement in activities will improve Outcome: Progressing Goal: Sleeping patterns will improve Outcome: Progressing   Problem: Coping: Goal: Ability to verbalize frustrations and anger appropriately will improve Outcome: Progressing Goal: Ability to demonstrate self-control will improve Outcome: Progressing   Problem: Health Behavior/Discharge Planning: Goal: Identification of resources available to assist in meeting health care needs will improve Outcome: Progressing Goal: Compliance with treatment plan for underlying cause of condition will improve Outcome: Progressing   Problem: Physical Regulation: Goal: Ability to maintain clinical measurements within normal limits will improve Outcome: Progressing   Problem: Safety: Goal: Periods of time without injury will increase Outcome: Progressing

## 2022-03-23 DIAGNOSIS — F332 Major depressive disorder, recurrent severe without psychotic features: Secondary | ICD-10-CM | POA: Diagnosis not present

## 2022-03-23 MED ORDER — TEMAZEPAM 15 MG PO CAPS
15.0000 mg | ORAL_CAPSULE | Freq: Every day | ORAL | Status: DC
  Administered 2022-03-23 – 2022-03-24 (×2): 15 mg via ORAL
  Filled 2022-03-23 (×2): qty 1

## 2022-03-23 MED ORDER — GABAPENTIN 300 MG PO CAPS
300.0000 mg | ORAL_CAPSULE | Freq: Three times a day (TID) | ORAL | Status: DC
  Administered 2022-03-23 – 2022-03-25 (×5): 300 mg via ORAL
  Filled 2022-03-23 (×5): qty 1

## 2022-03-23 NOTE — Progress Notes (Signed)
   03/23/22 2015  Psych Admission Type (Psych Patients Only)  Admission Status Voluntary  Psychosocial Assessment  Patient Complaints Anxiety;Worrying  Eye Contact Fair  Facial Expression Anxious;Flat  Affect Anxious  Speech Logical/coherent  Interaction Assertive  Motor Activity Slow  Appearance/Hygiene In scrubs;Unremarkable  Behavior Characteristics Cooperative;Appropriate to situation;Anxious  Mood Anxious  Thought Process  Coherency WDL  Content WDL  Delusions None reported or observed  Perception WDL  Hallucination None reported or observed  Judgment WDL  Confusion None  Danger to Self  Current suicidal ideation? Denies  Danger to Others  Danger to Others None reported or observed   Pt seen in dayroom interacting with peers. Pt denies SI, HI, AVH and pain. Pt rates anxiety 1/10. Denies depression. "I feel less anxious overall. I have been good today." Pt was asked about "anxiety spikes" today. "This morning I was anxious. That medication, what's it called (Seroquel)? When I woke up this morning it was so dark. I woke up someplace else. I thought I was losing my mind. That medicine did not work." Pt is to be started on Restoril tonight instead of Seroquel. Discussed safety measures while on this medication. Pt told to report any issue with anxiety or sleep in the morning. Pt verbalized understanding.  Pt given scheduled medications as prescribed. PRNs as appropriate. Q15 minute checks for safety. Pt safe on unit.

## 2022-03-23 NOTE — Progress Notes (Signed)
D: Pt alert and oriented. Pt rates his anxiety 2/10 and denies depression at time of assessment. Pt denies experiencing any pain at this time. Pt denies experiencing any SI/HI, or AVH at this time, describes his mood as, "Good."    A: Scheduled medications administered as prescribed, Ativan 0.5 mg po prn given for c/o anxiety. Support and encouragement provided. Routine safety checks conducted q15 minutes.   R: No adverse drug reactions noted, administered Prn noted as effective at reassessment. Pt verbally is agreeable to notifying staff with any safety concerns. Pt interacts appropriately with others on the unit. Pt remains safe at this time. Will continue to monitor.

## 2022-03-23 NOTE — Progress Notes (Signed)
[facility] MD Progress Note  03/23/2022 11:40 AM John [name]  MRN:  [id number] Subjective: John [name] is seen on rounds today.  He is doing a little bit better.  She states that he woke up early and was confused and I informed him that Seroquel can do that if you wake up on it.  He was anxiety is a little bit better.  Neurontin seems to be helping.  He is still taking the Ativan at night which he thinks is helpful.  I told him we will go ahead and get rid of the Seroquel and start him on Restoril and increase the Neurontin.  Principal Problem: MDD (major depressive disorder) Diagnosis: Principal Problem:   MDD (major depressive disorder)  Total Time spent with patient: 15 minutes  Past Psychiatric History:  Patient has long history of anxiety and depression, which he identifies as stemming from a head injury at 81 years of age.  He has been functioning very well throughout his life.  He is a retired Chief Financial Officer.  He has been on a low-dose of venlafaxine for many years.  He has had a few hospitalizations, the last being in the 1990s.  The only suicide attempt that he describes is putting a bag over his head many years ago, but then he took it off.  He sees his primary care physician for medication management and has had a therapist for many years and knew "spiritual director" for the last year and a half, with whom he has a good relationship.  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   Eczema    Elevated PSA    H/O Osgood-Schlatter disease    Hand deformity, congenital    Hearing loss bilateral   Hemorrhoids    Horner syndrome    Hypertension    Migraine    Obesity    PONV (postoperative nausea and vomiting)    Prostate cancer ([place]) 2020   Rosacea    Sleep apnea     Past Surgical History:  Procedure Laterality Date   ANKLE  ARTHROSCOPY Left    fracture   CHOLECYSTECTOMY  2007   CRANIOTOMY  1958   left frontal craniotomy for epidural hematoma   INSERTION OF MESH  01/02/2022   Procedure: INSERTION OF MESH;  Surgeon: Herbert Pun, MD;  Location: [facility] ORS;  Service: General;;   LIGAMENT REPAIR Right    thumb   RETINAL DETACHMENT SURGERY     TREATMENT FISTULA ANAL  2006   Family History:  Family History  Problem Relation Age of Onset   Osteoporosis Mother    Depression Mother    Prostate cancer Father    Hypertension Father    Diabetes Brother    Kidney disease Neg Hx     Social History:  Social History   Substance and Sexual Activity  Alcohol Use Not Currently     Social History   Substance and Sexual Activity  Drug Use No    Social History   Socioeconomic History   Marital status: Married    Spouse name: John [name]   Number of children: 2   Years of education: Not on file   Highest education level: Not on file  Occupational History   Not on file  Tobacco Use   Smoking status: Never  Smokeless tobacco: Never  Vaping Use   Vaping Use: Never used  Substance and Sexual Activity   Alcohol use: Not Currently   Drug use: No   Sexual activity: Yes    Birth control/protection: None  Other Topics Concern   Not on file  Social History Narrative   Live at home with wife.    Social Determinants of Health   Financial Resource Strain: Not on file  Food Insecurity: Not on file  Transportation Needs: Not on file  Physical Activity: Not on file  Stress: Not on file  Social Connections: Not on file   Additional Social History:                         Sleep: Fair  Appetite:  Good  Current Medications: Current Facility-Administered Medications  Medication Dose Route Frequency Provider Last Rate Last Admin   acetaminophen (TYLENOL) tablet 650 mg  650 mg Oral Q6H PRN Sherlon Handing, NP       alum & mag hydroxide-simeth (MAALOX/MYLANTA) 200-200-20 MG/5ML suspension 30  mL  30 mL Oral Q4H PRN Waldon Merl F, NP       atenolol (TENORMIN) tablet 50 mg  50 mg Oral Daily Waldon Merl F, NP   50 mg at 03/23/22 0844   gabapentin (NEURONTIN) capsule 300 mg  300 mg Oral TID AC Chermaine Schnyder Percell Miller, DO       glipiZIDE (GLUCOTROL) tablet 5 mg  5 mg Oral BID Caroline Sauger, NP   5 mg at 03/23/22 0845   LORazepam (ATIVAN) tablet 0.5 mg  0.5 mg Oral Q4H PRN Parks Ranger, DO   0.5 mg at 03/23/22 0736   magnesium hydroxide (MILK OF MAGNESIA) suspension 30 mL  30 mL Oral Daily PRN Waldon Merl F, NP   30 mL at 03/21/22 1059   tamsulosin (FLOMAX) capsule 0.4 mg  0.4 mg Oral Q2200 Caroline Sauger, NP   0.4 mg at 03/22/22 2112   temazepam (RESTORIL) capsule 15 mg  15 mg Oral QHS Parks Ranger, DO       venlafaxine XR (EFFEXOR-XR) 24 hr capsule 300 mg  300 mg Oral QPC breakfast Parks Ranger, DO   300 mg at 03/23/22 1950    Lab Results: No results found for this or any previous visit (from the past 48 hour(s)).  Blood Alcohol level:  Lab Results  Component Value Date   ETH <10 93/26/7124    Metabolic Disorder Labs: Lab Results  Component Value Date   HGBA1C 5.6 03/19/2022   MPG 114.02 03/19/2022   No results found for: PROLACTIN Lab Results  Component Value Date   CHOL 131 03/19/2022   TRIG 92 03/19/2022   HDL 33 (L) 03/19/2022   CHOLHDL 4.0 03/19/2022   VLDL 18 03/19/2022   LDLCALC 80 03/19/2022    Physical Findings: AIMS:  , ,  ,  ,    CIWA:    COWS:     Musculoskeletal: Strength & Muscle Tone: within normal limits Gait & Station: normal Patient leans: N/A  Psychiatric Specialty Exam:  Presentation  General Appearance: Appropriate for Environment  Eye Contact:Good  Speech:Clear and Coherent  Speech Volume:Normal  Handedness:No data recorded  Mood and Affect  Mood:Depressed  Affect:Congruent   Thought Process  Thought Processes:Coherent  Descriptions of  Associations:Intact  Orientation:Full (Time, Place and Person)  Thought Content:Logical  History of Schizophrenia/Schizoaffective disorder:No  Duration of Psychotic Symptoms:No data recorded Hallucinations:No data recorded Ideas  of Reference:None  Suicidal Thoughts:No data recorded Homicidal Thoughts:No data recorded  Sensorium  Memory:Immediate Good; Recent Good; Remote Good  Judgment:No data recorded Insight:Good   Executive Functions  Concentration:Good  Attention Span:Good  [place]  Language:Good   Psychomotor Activity  Psychomotor Activity:No data recorded  Assets  Assets:Desire for Improvement; Armed forces logistics/support/administrative officer; Financial Resources/Insurance; Housing; Intimacy; Social Support; Resilience; Physical Health   Sleep  Sleep:No data recorded   Physical Exam: Physical Exam Vitals and nursing note reviewed.  Constitutional:      Appearance: Normal appearance. He is normal weight.  Neurological:     General: No focal deficit present.     Mental Status: He is alert and oriented to person, place, and time.  Psychiatric:        Attention and Perception: Attention and perception normal.        Mood and Affect: Mood is anxious and depressed.        Speech: Speech normal.        Behavior: Behavior normal. Behavior is cooperative.        Thought Content: Thought content normal.        Cognition and Memory: Cognition and memory normal.        Judgment: Judgment normal.   Review of Systems  Constitutional: Negative.   HENT: Negative.    Eyes: Negative.   Respiratory: Negative.    Cardiovascular: Negative.   Gastrointestinal: Negative.   Genitourinary: Negative.   Musculoskeletal: Negative.   Skin: Negative.   Neurological: Negative.   Endo/Heme/Allergies: Negative.   Psychiatric/Behavioral:  Positive for depression. The patient is nervous/anxious.   Blood pressure 118/81, pulse 60, temperature (!) 97 F (36.1 C), resp.  rate 18, SpO2 99 %. There is no height or weight on file to calculate BMI.   Treatment Plan Summary: Daily contact with patient to assess and evaluate symptoms and progress in treatment, Medication management, and Plan discontinue Seroquel and start Restoril 15 mg at bedtime and increase Neurontin to 300 mg 3 times a day.  Parks Ranger, DO 03/23/2022, 11:40 AM

## 2022-03-23 NOTE — Progress Notes (Signed)
Patient is alert and oriented times 4. Mood and affect anxious and sullen. Patient denies pain. He denies SI, HI, and AVH. Patient expresses feelings of anxiety and depression 4/10 at this time. States he slept good last night. Morning meds given whole by mouth W/O difficulty. Ate breakfast in day room- appetite good. Patient remains on unit with Q15 minute checks in place.

## 2022-03-23 NOTE — Plan of Care (Signed)
  Problem: Education: Goal: Utilization of techniques to improve thought processes will improve Outcome: Progressing Goal: Knowledge of the prescribed therapeutic regimen will improve Outcome: Progressing   Problem: Activity: Goal: Interest or engagement in leisure activities will improve Outcome: Progressing Goal: Imbalance in normal sleep/wake cycle will improve Outcome: Progressing   Problem: Coping: Goal: Coping ability will improve Outcome: Progressing Goal: Will verbalize feelings Outcome: Progressing   Problem: Health Behavior/Discharge Planning: Goal: Ability to make decisions will improve Outcome: Progressing Goal: Compliance with therapeutic regimen will improve Outcome: Progressing   Problem: Role Relationship: Goal: Will demonstrate positive changes in social behaviors and relationships Outcome: Progressing   Problem: Self-Concept: Goal: Will verbalize positive feelings about self Outcome: Progressing Goal: Level of anxiety will decrease Outcome: Progressing   Problem: Safety: Goal: Ability to disclose and discuss suicidal ideas will improve Outcome: Progressing Goal: Ability to identify and utilize support systems that promote safety will improve Outcome: Progressing   Problem: Education: Goal: Knowledge of [place] [facility] Education information/materials will improve Outcome: Progressing Goal: Emotional status will improve Outcome: Progressing Goal: Mental status will improve Outcome: Progressing Goal: Verbalization of understanding the information provided will improve Outcome: Progressing   Problem: Safety: Goal: Periods of time without injury will increase Outcome: Progressing   Problem: Physical Regulation: Goal: Ability to maintain clinical measurements within normal limits will improve Outcome: Progressing   Problem: Health Behavior/Discharge Planning: Goal: Identification of resources available to assist in meeting health care needs  will improve Outcome: Progressing Goal: Compliance with treatment plan for underlying cause of condition will improve Outcome: Progressing

## 2022-03-24 DIAGNOSIS — F332 Major depressive disorder, recurrent severe without psychotic features: Secondary | ICD-10-CM | POA: Diagnosis not present

## 2022-03-24 MED ORDER — METOPROLOL SUCCINATE ER 25 MG PO TB24
50.0000 mg | ORAL_TABLET | Freq: Every day | ORAL | Status: DC
  Administered 2022-03-25 – 2022-03-31 (×7): 50 mg via ORAL
  Filled 2022-03-24 (×8): qty 2

## 2022-03-24 MED ORDER — METOPROLOL SUCCINATE ER 25 MG PO TB24: 50.0000 mg | ORAL_TABLET | Freq: Every day | ORAL | Status: DC

## 2022-03-24 NOTE — Progress Notes (Signed)
[facility] MD Progress Note  03/24/2022 11:49 AM John [name]  MRN:  [id number] Subjective: John [name] is seen on rounds.  He states that he slept well last night but continues to have a lot of anxiety in the morning.  He agrees that it does get better throughout the day.  We increased his Neurontin yesterday.  His blood pressure is higher so augmented change his atenolol to Toprol-XL.  That might even help with his anxiety.  Principal Problem: MDD (major depressive disorder) Diagnosis: Principal Problem:   MDD (major depressive disorder)  Total Time spent with patient: 15 minutes  Past Psychiatric History:  Patient has long history of anxiety and depression, which he identifies as stemming from a head injury at 81 years of age.  He has been functioning very well throughout his life.  He is a retired Chief Financial Officer.  He has been on a low-dose of venlafaxine for many years.  He has had a few hospitalizations, the last being in the 1990s.  The only suicide attempt that he describes is putting a bag over his head many years ago, but then he took it off.  He sees his primary care physician for medication management and has had a therapist for many years and knew "spiritual director" for the last year and a half, with whom he has a good relationship.  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   Eczema    Elevated PSA    H/O Osgood-Schlatter disease    Hand deformity, congenital    Hearing loss bilateral   Hemorrhoids    Horner syndrome    Hypertension    Migraine    Obesity    PONV (postoperative nausea and vomiting)    Prostate cancer ([place]) 2020   Rosacea    Sleep apnea     Past Surgical History:  Procedure Laterality Date   ANKLE ARTHROSCOPY Left    fracture   CHOLECYSTECTOMY  2007   CRANIOTOMY  1958   left frontal craniotomy for  epidural hematoma   INSERTION OF MESH  01/02/2022   Procedure: INSERTION OF MESH;  Surgeon: Herbert Pun, MD;  Location: [facility] ORS;  Service: General;;   LIGAMENT REPAIR Right    thumb   RETINAL DETACHMENT SURGERY     TREATMENT FISTULA ANAL  2006   Family History:  Family History  Problem Relation Age of Onset   Osteoporosis Mother    Depression Mother    Prostate cancer Father    Hypertension Father    Diabetes Brother    Kidney disease Neg Hx     Social History:  Social History   Substance and Sexual Activity  Alcohol Use Not Currently     Social History   Substance and Sexual Activity  Drug Use No    Social History   Socioeconomic History   Marital status: Married    Spouse name: [name]   Number of children: 2   Years of education: Not on file   Highest education level: Not on file  Occupational History   Not on file  Tobacco Use   Smoking status: Never   Smokeless tobacco: Never  Vaping Use   Vaping Use: Never used  Substance and Sexual Activity   Alcohol use: Not Currently   Drug use: No  Sexual activity: Yes    Birth control/protection: None  Other Topics Concern   Not on file  Social History Narrative   Live at home with wife.    Social Determinants of Health   Financial Resource Strain: Not on file  Food Insecurity: Not on file  Transportation Needs: Not on file  Physical Activity: Not on file  Stress: Not on file  Social Connections: Not on file   Additional Social History:                         Sleep: Good  Appetite:  Good  Current Medications: Current Facility-Administered Medications  Medication Dose Route Frequency Provider Last Rate Last Admin   acetaminophen (TYLENOL) tablet 650 mg  650 mg Oral Q6H PRN Sherlon Handing, NP       alum & mag hydroxide-simeth (MAALOX/MYLANTA) 200-200-20 MG/5ML suspension 30 mL  30 mL Oral Q4H PRN Waldon Merl F, NP       gabapentin (NEURONTIN) capsule 300 mg  300 mg Oral  TID AC Parks Ranger, DO   300 mg at 03/24/22 0853   glipiZIDE (GLUCOTROL) tablet 5 mg  5 mg Oral BID Caroline Sauger, NP   5 mg at 03/24/22 0853   LORazepam (ATIVAN) tablet 0.5 mg  0.5 mg Oral Q4H PRN Parks Ranger, DO   0.5 mg at 03/24/22 0853   magnesium hydroxide (MILK OF MAGNESIA) suspension 30 mL  30 mL Oral Daily PRN Waldon Merl F, NP   30 mL at 03/21/22 1059   [START ON 03/25/2022] metoprolol succinate (TOPROL-XL) 24 hr tablet 50 mg  50 mg Oral QPC breakfast Parks Ranger, DO       tamsulosin [facility]) capsule 0.4 mg  0.4 mg Oral Q2200 Caroline Sauger, NP   0.4 mg at 03/23/22 2126   temazepam (RESTORIL) capsule 15 mg  15 mg Oral QHS Parks Ranger, DO   15 mg at 03/23/22 2126   venlafaxine XR (EFFEXOR-XR) 24 hr capsule 300 mg  300 mg Oral QPC breakfast Parks Ranger, DO   300 mg at 03/24/22 7342    Lab Results: No results found for this or any previous visit (from the past 48 hour(s)).  Blood Alcohol level:  Lab Results  Component Value Date   ETH <10 87/68/1157    Metabolic Disorder Labs: Lab Results  Component Value Date   HGBA1C 5.6 03/19/2022   MPG 114.02 03/19/2022   No results found for: PROLACTIN Lab Results  Component Value Date   CHOL 131 03/19/2022   TRIG 92 03/19/2022   HDL 33 (L) 03/19/2022   CHOLHDL 4.0 03/19/2022   VLDL 18 03/19/2022   LDLCALC 80 03/19/2022    Physical Findings: AIMS:  , ,  ,  ,    CIWA:    COWS:     Musculoskeletal: Strength & Muscle Tone: within normal limits Gait & Station: normal Patient leans: N/A  Psychiatric Specialty Exam:  Presentation  General Appearance: Appropriate for Environment  Eye Contact:Good  Speech:Clear and Coherent  Speech Volume:Normal  Handedness:No data recorded  Mood and Affect  Mood:Depressed  Affect:Congruent   Thought Process  Thought Processes:Coherent  Descriptions of Associations:Intact  Orientation:Full (Time, Place  and Person)  Thought Content:Logical  History of Schizophrenia/Schizoaffective disorder:No  Duration of Psychotic Symptoms:No data recorded Hallucinations:No data recorded Ideas of Reference:None  Suicidal Thoughts:No data recorded Homicidal Thoughts:No data recorded  Sensorium  Memory:Immediate Good; Recent Good; Remote Good  Judgment:No data recorded Insight:Good   Executive Functions  Concentration:Good  Attention Span:Good  [place]  Language:Good   Psychomotor Activity  Psychomotor Activity:No data recorded  Assets  Assets:Desire for Improvement; Armed forces logistics/support/administrative officer; Financial Resources/Insurance; Housing; Intimacy; Social Support; Resilience; Physical Health   Sleep  Sleep:No data recorded   Physical Exam: Physical Exam Vitals and nursing note reviewed.  Constitutional:      Appearance: Normal appearance. He is normal weight.  Neurological:     General: No focal deficit present.     Mental Status: He is alert and oriented to person, place, and time.  Psychiatric:        Mood and Affect: Mood normal.        Behavior: Behavior normal.   Review of Systems  Constitutional: Negative.   HENT: Negative.    Eyes: Negative.   Respiratory: Negative.    Cardiovascular: Negative.   Gastrointestinal: Negative.   Genitourinary: Negative.   Musculoskeletal: Negative.   Skin: Negative.   Neurological: Negative.   Endo/Heme/Allergies: Negative.   Psychiatric/Behavioral: Negative.    Blood pressure (!) 159/88, pulse 60, temperature 98 F (36.7 C), resp. rate 18, SpO2 99 %. There is no height or weight on file to calculate BMI.   Treatment Plan Summary: Daily contact with patient to assess and evaluate symptoms and progress in treatment, Medication management, and Plan discontinue atenolol and start Toprol-XL 50 mg every morning.  Parks Ranger, DO 03/24/2022, 11:49 AM

## 2022-03-24 NOTE — Progress Notes (Signed)
   03/24/22 1950  Psych Admission Type (Psych Patients Only)  Admission Status Voluntary  Psychosocial Assessment  Patient Complaints Anxiety;Depression;Worrying  Eye Contact Fair  Facial Expression Anxious  Affect Anxious  Speech Logical/coherent  Interaction Assertive  Motor Activity Slow  Appearance/Hygiene In scrubs;Unremarkable  Behavior Characteristics Cooperative;Appropriate to situation;Anxious  Mood Depressed;Anxious  Thought Process  Coherency WDL  Content WDL;Preoccupation (preoccupied with feeling anxiety)  Delusions None reported or observed  Perception WDL  Hallucination None reported or observed  Judgment WDL  Confusion None  Danger to Self  Current suicidal ideation? Denies  Danger to Others  Danger to Others None reported or observed   Pt seen in dayroom. Pt denies SI, HI, AVH and pain. Pt rates anxiety 5/10 and depression 5/10. Pt says he feels like he is stuck. "I feel like I'm blocked. Like there is a block and I'm trapped. I can't move. My thoughts are hard to focus. I keep having the same thoughts going through my head." Pt states that he slept well last night and the Restoril didn't make him confused this morning. Pt worried about getting his financial items in order so that his wife won't worry if he dies. Pt did attend groups today and is eating his meals. "I spend most of my time in the house. It just happened that way. I used to cycle 20 years ago but I'm afraid of falling now. I do have electronic projects and they keep my mind occupied for a while." Pt ruminating on possible causes for worry. Hard to nail down what is specifically driving his anxiety.  Pt also c/o constipation. PRNs will be given. Pt given scheduled medications as prescribed. Q15 minute checks for safety. Pt safe on unit.

## 2022-03-24 NOTE — Group Note (Signed)
LCSW Group Therapy Note   Group Date: 03/24/2022 Start Time: 1300 End Time: 1400   Type of Therapy and Topic:  Group Therapy: Boundaries  Participation Level:  Minimal  Description of Group: This group will address the use of boundaries in their personal lives. Patients will explore why boundaries are important, the difference between healthy and unhealthy boundaries, and negative and postive outcomes of different boundaries and will look at how boundaries can be crossed.  Patients will be encouraged to identify current boundaries in their own lives and identify what kind of boundary is being set. Facilitators will guide patients in utilizing problem-solving interventions to address and correct types boundaries being used and to address when no boundary is being used. Understanding and applying boundaries will be explored and addressed for obtaining and maintaining a balanced life. Patients will be encouraged to explore ways to assertively make their boundaries and needs known to significant others in their lives, using other group members and facilitator for role play, support, and feedback.  Therapeutic Goals:  1.  Patient will identify areas in their life where setting clear boundaries could be  used to improve their life.  2.  Patient will identify signs/triggers that a boundary is not being respected. 3.  Patient will identify two ways to set boundaries in order to achieve balance in  their lives: 4.  Patient will demonstrate ability to communicate their needs and set boundaries  through discussion and/or role plays  Summary of Patient Progress: Patient was present for the entirety of the group session. Patient participated in the ice breaker activity. Patient declined to participate in topic of discussion when encouraged, stating that he was having a difficult time "collecting my thoughts" due to feeling "stressed." Patient appeared to actively listen to other group members share.    Therapeutic Modalities:   Cognitive Behavioral Therapy Solution-Focused Therapy  Berniece Salines, Latanya Presser 03/24/2022  2:59 PM

## 2022-03-24 NOTE — Plan of Care (Signed)
Patient is alert and oriented times 4. Mood and affect anxious and sullen. Patient denies pain. He denies SI, HI, and AVH. Patient expresses feelings of anxiety and depression 2/10 PRN Ativan given at this time. States he slept good last night. Morning meds given whole by mouth W/O difficulty. Ate breakfast in day room- appetite good. Patient remains on unit with Q15 minute checks in place.        Problem: Education: Goal: Utilization of techniques to improve thought processes will improve Outcome: Progressing Goal: Knowledge of the prescribed therapeutic regimen will improve Outcome: Progressing   Problem: Activity: Goal: Interest or engagement in leisure activities will improve Outcome: Progressing Goal: Imbalance in normal sleep/wake cycle will improve Outcome: Progressing   Problem: Coping: Goal: Coping ability will improve Outcome: Progressing Goal: Will verbalize feelings Outcome: Progressing   Problem: Health Behavior/Discharge Planning: Goal: Ability to make decisions will improve Outcome: Progressing Goal: Compliance with therapeutic regimen will improve Outcome: Progressing   Problem: Safety: Goal: Ability to disclose and discuss suicidal ideas will improve Outcome: Progressing Goal: Ability to identify and utilize support systems that promote safety will improve Outcome: Progressing   Problem: Self-Concept: Goal: Will verbalize positive feelings about self Outcome: Progressing Goal: Level of anxiety will decrease Outcome: Progressing   P

## 2022-03-24 NOTE — Progress Notes (Signed)
Pt c/o constipation. Stated that LBM was 2 days ago. Pt given PRNs as appropriate.

## 2022-03-25 ENCOUNTER — Inpatient Hospital Stay: Payer: Medicare Other

## 2022-03-25 DIAGNOSIS — F332 Major depressive disorder, recurrent severe without psychotic features: Secondary | ICD-10-CM | POA: Diagnosis not present

## 2022-03-25 LAB — GLUCOSE, CAPILLARY
Glucose-Capillary: 59 mg/dL — ABNORMAL LOW (ref 70–99)
Glucose-Capillary: 96 mg/dL (ref 70–99)

## 2022-03-25 MED ORDER — TRAZODONE HCL 50 MG PO TABS
50.0000 mg | ORAL_TABLET | Freq: Every evening | ORAL | Status: DC | PRN
  Administered 2022-03-25 – 2022-03-31 (×7): 50 mg via ORAL
  Filled 2022-03-25 (×7): qty 1

## 2022-03-25 MED ORDER — LOSARTAN POTASSIUM 25 MG PO TABS
50.0000 mg | ORAL_TABLET | Freq: Every day | ORAL | Status: DC
Start: 2022-03-25 — End: 2022-04-01
  Administered 2022-03-25 – 2022-03-31 (×7): 50 mg via ORAL
  Filled 2022-03-25 (×7): qty 2

## 2022-03-25 MED ORDER — DIVALPROEX SODIUM 125 MG PO DR TAB
125.0000 mg | DELAYED_RELEASE_TABLET | Freq: Three times a day (TID) | ORAL | Status: DC
  Administered 2022-03-25 – 2022-03-26 (×3): 125 mg via ORAL
  Filled 2022-03-25 (×3): qty 1

## 2022-03-25 NOTE — Progress Notes (Signed)
Hypoglycemic Event  CBG: 59  Treatment: 4 oz juice/soda  Symptoms: None  Follow-up CBG: Time:0213 CBG Result:96  Possible Reasons for Event: Medication regimen: Glipizide 5 mg at bedtime  Comments/MD notified:NP Rashaun Dixon, notified. No new orders placed.    Lajoyce Corners

## 2022-03-25 NOTE — BH IP Treatment Plan (Signed)
Interdisciplinary Treatment and Diagnostic Plan Update  03/25/2022 Time of Session: 9:30AM John [name] MRN: [id number]  Principal Diagnosis: MDD (major depressive disorder)  Secondary Diagnoses: Principal Problem:   MDD (major depressive disorder)   Current Medications:  Current Facility-Administered Medications  Medication Dose Route Frequency Provider Last Rate Last Admin   acetaminophen (TYLENOL) tablet 650 mg  650 mg Oral Q6H PRN Sherlon Handing, NP       alum & mag hydroxide-simeth (MAALOX/MYLANTA) 200-200-20 MG/5ML suspension 30 mL  30 mL Oral Q4H PRN Sherlon Handing, NP       divalproex (DEPAKOTE) DR tablet 125 mg  125 mg Oral Q8H Herrick, Richard Edward, DO       LORazepam (ATIVAN) tablet 0.5 mg  0.5 mg Oral Q4H PRN Parks Ranger, DO   0.5 mg at 03/25/22 9242   losartan (COZAAR) tablet 50 mg  50 mg Oral Daily Parks Ranger, DO       magnesium hydroxide (MILK OF MAGNESIA) suspension 30 mL  30 mL Oral Daily PRN Waldon Merl F, NP   30 mL at 03/24/22 2017   metoprolol succinate (TOPROL-XL) 24 hr tablet 50 mg  50 mg Oral QPC breakfast Parks Ranger, DO   50 mg at 03/25/22 6834   tamsulosin (FLOMAX) capsule 0.4 mg  0.4 mg Oral Q2200 Caroline Sauger, NP   0.4 mg at 03/24/22 2140   traZODone (DESYREL) tablet 50 mg  50 mg Oral QHS PRN Parks Ranger, DO       venlafaxine XR (EFFEXOR-XR) 24 hr capsule 300 mg  300 mg Oral QPC breakfast Parks Ranger, DO   300 mg at 03/25/22 0920   PTA Medications: Facility-Administered Medications Prior to Admission  Medication Dose Route Frequency Provider Last Rate Last Admin   gentamicin (GARAMYCIN) injection 80 mg  80 mg Intramuscular Once Hollice Espy, MD       levofloxacin [facility]) tablet 500 mg  500 mg Oral Once Hollice Espy, MD       Medications Prior to Admission  Medication Sig Dispense Refill Last Dose   aspirin EC 81 MG tablet Take 81 mg by mouth daily.      atenolol  (TENORMIN) 50 MG tablet Take 50 mg by mouth daily.  0    buPROPion ER (WELLBUTRIN SR) 100 MG 12 hr tablet Take 100 mg by mouth 2 (two) times daily.      glipiZIDE (GLUCOTROL) 5 MG tablet Take 10 mg by mouth 2 (two) times daily before a meal.      oxyCODONE-acetaminophen (PERCOCET) 5-325 MG tablet Take 1 tablet by mouth every 4 (four) hours as needed for severe pain. (Patient not taking: Reported on 03/12/2022) 20 tablet 0    SUMAtriptan (IMITREX) 100 MG tablet TAKE 1 TABLET BY MOUTH ONCE AS NEEDED FOR MIGRAINE. MAY REPEAT 2ND DOSE AFTER 2 HOUR IF NEEDED      tamsulosin (FLOMAX) 0.4 MG CAPS capsule TAKE 1 CAPSULE (0.4 MG TOTAL) BY MOUTH DAILY AFTER SUPPER. 90 capsule 3    venlafaxine (EFFEXOR) 37.5 MG tablet Take 75 mg by mouth daily.       Patient Stressors: Other: vision issues after surgery.    Patient Strengths: Work Software engineer Modalities: Medication Management, Group therapy, Case management,  1 to 1 session with clinician, Psychoeducation, Recreational therapy.   Physician Treatment Plan for Primary Diagnosis: MDD (major depressive disorder) Long Term Goal(s): Improvement in symptoms so as ready for discharge   Short Term Goals:  Ability to identify changes in lifestyle to reduce recurrence of condition will improve Ability to verbalize feelings will improve Ability to disclose and discuss suicidal ideas Ability to demonstrate self-control will improve Ability to identify and develop effective coping behaviors will improve Ability to maintain clinical measurements within normal limits will improve Compliance with prescribed medications will improve Ability to identify triggers associated with substance abuse/mental health issues will improve  Medication Management: Evaluate patient's response, side effects, and tolerance of medication regimen.  Therapeutic Interventions: 1 to 1 sessions, Unit Group sessions and Medication administration.  Evaluation of Outcomes:  Progressing  Physician Treatment Plan for Secondary Diagnosis: Principal Problem:   MDD (major depressive disorder)  Long Term Goal(s): Improvement in symptoms so as ready for discharge   Short Term Goals: Ability to identify changes in lifestyle to reduce recurrence of condition will improve Ability to verbalize feelings will improve Ability to disclose and discuss suicidal ideas Ability to demonstrate self-control will improve Ability to identify and develop effective coping behaviors will improve Ability to maintain clinical measurements within normal limits will improve Compliance with prescribed medications will improve Ability to identify triggers associated with substance abuse/mental health issues will improve     Medication Management: Evaluate patient's response, side effects, and tolerance of medication regimen.  Therapeutic Interventions: 1 to 1 sessions, Unit Group sessions and Medication administration.  Evaluation of Outcomes: Progressing   RN Treatment Plan for Primary Diagnosis: MDD (major depressive disorder) Long Term Goal(s): Knowledge of disease and therapeutic regimen to maintain health will improve  Short Term Goals: Ability to remain free from injury will improve, Ability to verbalize frustration and anger appropriately will improve, Ability to demonstrate self-control, Ability to participate in decision making will improve, Ability to verbalize feelings will improve, Ability to disclose and discuss suicidal ideas, Ability to identify and develop effective coping behaviors will improve, and Compliance with prescribed medications will improve  Medication Management: RN will administer medications as ordered by provider, will assess and evaluate patient's response and provide education to patient for prescribed medication. RN will report any adverse and/or side effects to prescribing provider.  Therapeutic Interventions: 1 on 1 counseling sessions, Psychoeducation,  Medication administration, Evaluate responses to treatment, Monitor vital signs and CBGs as ordered, Perform/monitor CIWA, COWS, AIMS and Fall Risk screenings as ordered, Perform wound care treatments as ordered.  Evaluation of Outcomes: Progressing   LCSW Treatment Plan for Primary Diagnosis: MDD (major depressive disorder) Long Term Goal(s): Safe transition to appropriate next level of care at discharge, Engage patient in therapeutic group addressing interpersonal concerns.  Short Term Goals: Engage patient in aftercare planning with referrals and resources, Increase social support, Increase ability to appropriately verbalize feelings, Increase emotional regulation, Facilitate acceptance of mental health diagnosis and concerns, Identify triggers associated with mental health/substance abuse issues, and Increase skills for wellness and recovery  Therapeutic Interventions: Assess for all discharge needs, 1 to 1 time with Social worker, Explore available resources and support systems, Assess for adequacy in community support network, Educate family and significant other(s) on suicide prevention, Complete Psychosocial Assessment, Interpersonal group therapy.  Evaluation of Outcomes: Progressing   Progress in Treatment: Attending groups: Yes. Participating in groups: Yes. Taking medication as prescribed: Yes. Toleration medication: Yes. Family/Significant other contact made: Yes, individual(s) contacted:  SPE completed with pt's wife Legion Discher Patient understands diagnosis: Yes. Discussing patient identified problems/goals with staff: Yes. Medical problems stabilized or resolved: Yes. Denies suicidal/homicidal ideation: Yes. Issues/concerns per patient self-inventory: No. Other: None  New  problem(s) identified: No, Describe:  None  New Short Term/Long Term Goal(s): Patient to work towards, medication management for mood stabilization; elimination of SI thoughts; development of  comprehensive mental wellness plan. Update 03/20/22: No changes at this time. Update 03/25/22: No changes at this time.      Patient Goals: "work on my depression and anxiety"   Update 03/20/22: No changes at this time. Update 03/25/22: No changes at this time.    Discharge Plan or Barriers: CSW will assist pt with development of appropriate discharge/aftercare plan. No psychosocial barriers identified regarding pt's ability to return home. Update 03/20/22: No changes at this time. Update 03/25/22: No changes at this time.    Reason for Continuation of Hospitalization: Anxiety Depression Medication stabilization   Estimated Length of Stay: TBD  Last 3 [place] Suicide Severity Risk Score: John [name] Admission (Current) from 03/13/2022 in [place] ED from 03/12/2022 in [place] Admission (Discharged) from 01/02/2022 in [place] No Risk No Risk No Risk       Last PHQ 2/9 Scores:    12/21/2021    8:59 AM 04/22/2017   10:46 AM  Depression screen PHQ 2/9  Decreased Interest 0 0  Down, Depressed, Hopeless 0 0  PHQ - 2 Score 0 0    Scribe for Treatment Team: Kyson Kupper A [place], [place] 03/25/2022 11:13 AM

## 2022-03-25 NOTE — Progress Notes (Signed)
Pt in bathroom; calm, cooperative. Pt states that he lost his balance and fell. Small amount of blood observed on pt's face. No acute distress noted. Assigned nurse Delfina Redwood, RN) notified.

## 2022-03-25 NOTE — Plan of Care (Signed)
Patient is alert and oriented times 4. Mood and affect anxious and sullen. Patient denies pain. He denies SI, HI, and AVH. Patient expresses feelings of anxiety and depression 9/10 PRN Ativan given at this time. States he did not sleep well last night. Patient is also s/p fall from last night- vitals WNL, no c/o headache, memory intact. Morning meds given whole by mouth W/O difficulty. Ate breakfast in day room- appetite good. Patient remains on unit with Q15 minute checks in place.    Problem: Education: Goal: Utilization of techniques to improve thought processes will improve Outcome: Progressing Goal: Knowledge of the prescribed therapeutic regimen will improve Outcome: Progressing   Problem: Activity: Goal: Interest or engagement in leisure activities will improve Outcome: Progressing Goal: Imbalance in normal sleep/wake cycle will improve Outcome: Progressing   Problem: Coping: Goal: Coping ability will improve Outcome: Progressing Goal: Will verbalize feelings Outcome: Progressing   Problem: Health Behavior/Discharge Planning: Goal: Ability to make decisions will improve Outcome: Progressing Goal: Compliance with therapeutic regimen will improve Outcome: Progressing   Problem: Role Relationship: Goal: Will demonstrate positive changes in social behaviors and relationships Outcome: Progressing   Problem: Safety: Goal: Ability to disclose and discuss suicidal ideas will improve Outcome: Progressing Goal: Ability to identify and utilize support systems that promote safety will improve Outcome: Progressing   Problem: Self-Concept: Goal: Will verbalize positive feelings about self Outcome: Progressing Goal: Level of anxiety will decrease Outcome: Not Progressing

## 2022-03-25 NOTE — Plan of Care (Signed)
Pt did present Worried and Anxious.  Denies SI/HI/AV/H and contracts for safety.  Pt offered emotional support and encouragement. Anxiety 5/10.Medication given to manage symptoms.  Denies 5/10 pain. Pt reports "I am worried about falling and starting ECT Tx but I spoke with the doctor he said he will discuss and let me know".  Pt reports restful sleep other than up to bathroom at night.  Pt reports "talking to people" as healthy coping skills.  Pt did eat hs snack and given adequate fluids. Q 15 minute safety checks in place.  Will continue plan of care.   Problem: Education: Goal: Knowledge of the prescribed therapeutic regimen will improve Outcome: Progressing   Problem: Activity: Goal: Interest or engagement in leisure activities will improve Outcome: Progressing Goal: Imbalance in normal sleep/wake cycle will improve Outcome: Progressing

## 2022-03-25 NOTE — Progress Notes (Signed)
Patient is s/p fall this morning at 1:50am. Vitals WNL, orientation intact, no c/o pain or discomfort. MD gave verbal order to discontinue neuro checks/Q2H vital signs.

## 2022-03-25 NOTE — Progress Notes (Signed)
[facility] MD Progress Note  03/25/2022 10:40 AM John [name]  MRN:  [id number] Subjective:  John [name] is seen on rounds.  He is still very depressed and anxious.  He states that he slept well until he went to the bathroom and lost his balance and nicked his forehead.  He recently started Restoril which I told Lilia Pro have to discontinue.  He got dizzy on Seroquel so I talked to him about trazodone which I will order as needed.  CT of his head was negative.  He has not been on trazodone or Depakote.  The Neurontin does not seem to be helping anyway.  He does get relief from Ativan but I do not want him taking it.  So I talked to him about Depakote and ECT.  He informs me the last time he was inpatient 20 years ago he received ECT and that helped.  He has had trouble with his blood pressure and was found to have low blood sugar last night.  I told him we will check his blood sugar daily and discontinue his glipizide for now.  Principal Problem: MDD (major depressive disorder) Diagnosis: Principal Problem:   MDD (major depressive disorder)  Total Time spent with patient: 15 minutes  Past Psychiatric History:  Patient has long history of anxiety and depression, which he identifies as stemming from a head injury at 81 years of age.  He has been functioning very well throughout his life.  He is a retired Chief Financial Officer.  He has been on a low-dose of venlafaxine for many years.  He has had a few hospitalizations, the last being in the 1990s.  The only suicide attempt that he describes is putting a bag over his head many years ago, but then he took it off.  He sees his primary care physician for medication management and has had a therapist for many years and knew "spiritual director" for the last year and a half, with whom he has a good relationship.  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   Eczema    Elevated PSA    H/O Osgood-Schlatter disease    Hand deformity, congenital    Hearing loss bilateral   Hemorrhoids    Horner syndrome    Hypertension    Migraine    Obesity    PONV (postoperative nausea and vomiting)    Prostate cancer ([place]) 2020   Rosacea    Sleep apnea     Past Surgical History:  Procedure Laterality Date   ANKLE ARTHROSCOPY Left    fracture   CHOLECYSTECTOMY  2007   CRANIOTOMY  1958   left frontal craniotomy for epidural hematoma   INSERTION OF MESH  01/02/2022   Procedure: INSERTION OF MESH;  Surgeon: Herbert Pun, MD;  Location: [facility] ORS;  Service: General;;   LIGAMENT REPAIR Right    thumb   RETINAL DETACHMENT SURGERY     TREATMENT FISTULA ANAL  2006   Family History:  Family History  Problem Relation Age of Onset   Osteoporosis Mother    Depression Mother    Prostate cancer Father    Hypertension Father    Diabetes Brother    Kidney disease Neg Hx     Social History:  Social History   Substance and Sexual Activity  Alcohol Use Not Currently     Social History   Substance and Sexual Activity  Drug Use No    Social History   Socioeconomic History   Marital status: Married    Spouse name: [name]   Number of children: 2   Years of education: Not on file   Highest education level: Not on file  Occupational History   Not on file  Tobacco Use   Smoking status: Never   Smokeless tobacco: Never  Vaping Use   Vaping Use: Never used  Substance and Sexual Activity   Alcohol use: Not Currently   Drug use: No   Sexual activity: Yes    Birth control/protection: None  Other Topics Concern   Not on file  Social History Narrative   Live at home with wife.    Social Determinants of Health   Financial Resource Strain: Not on file  Food Insecurity: Not on file  Transportation Needs: Not on file  Physical Activity: Not on file  Stress: Not on file  Social Connections:  Not on file   Additional Social History:                         Sleep: Fair  Appetite:  Good  Current Medications: Current Facility-Administered Medications  Medication Dose Route Frequency Provider Last Rate Last Admin   acetaminophen (TYLENOL) tablet 650 mg  650 mg Oral Q6H PRN Sherlon Handing, NP       alum & mag hydroxide-simeth (MAALOX/MYLANTA) 200-200-20 MG/5ML suspension 30 mL  30 mL Oral Q4H PRN Waldon Merl F, NP       divalproex (DEPAKOTE) DR tablet 125 mg  125 mg Oral Q8H Holland Nickson Edward, DO       LORazepam (ATIVAN) tablet 0.5 mg  0.5 mg Oral Q4H PRN Parks Ranger, DO   0.5 mg at 03/25/22 0263   losartan (COZAAR) tablet 50 mg  50 mg Oral Daily Parks Ranger, DO       magnesium hydroxide (MILK OF MAGNESIA) suspension 30 mL  30 mL Oral Daily PRN Waldon Merl F, NP   30 mL at 03/24/22 2017   metoprolol succinate (TOPROL-XL) 24 hr tablet 50 mg  50 mg Oral QPC breakfast Parks Ranger, DO   50 mg at 03/25/22 7858   tamsulosin (FLOMAX) capsule 0.4 mg  0.4 mg Oral Q2200 Caroline Sauger, NP   0.4 mg at 03/24/22 2140   traZODone (DESYREL) tablet 50 mg  50 mg Oral QHS PRN Parks Ranger, DO       venlafaxine XR (EFFEXOR-XR) 24 hr capsule 300 mg  300 mg Oral QPC breakfast Parks Ranger, DO   300 mg at 03/25/22 8502    Lab Results:  Results for orders placed or performed during the hospital encounter of 03/13/22 (from the past 48 hour(s))  Glucose, capillary     Status: Abnormal   Collection Time: 03/25/22  1:42 AM  Result Value Ref Range   Glucose-Capillary 59 (L) 70 - 99 mg/dL    Comment: Glucose reference range applies only to samples taken after fasting for at least 8 hours.  Glucose, capillary     Status: None   Collection Time: 03/25/22  2:13 AM  Result Value Ref Range   Glucose-Capillary 96 70 - 99 mg/dL    Comment: Glucose reference range applies only to samples taken after fasting for at  least 8 hours.    Blood Alcohol level:  Lab Results  Component Value Date   ETH <10 10/62/6948    Metabolic Disorder Labs: Lab Results  Component Value Date   HGBA1C 5.6 03/19/2022   MPG 114.02 03/19/2022   No results found for: PROLACTIN Lab Results  Component Value Date   CHOL 131 03/19/2022   TRIG 92 03/19/2022   HDL 33 (L) 03/19/2022   CHOLHDL 4.0 03/19/2022   VLDL 18 03/19/2022   LDLCALC 80 03/19/2022    Physical Findings: AIMS:  , ,  ,  ,    CIWA:    COWS:     Musculoskeletal: Strength & Muscle Tone: within normal limits Gait & Station: normal Patient leans: N/A  Psychiatric Specialty Exam:  Presentation  General Appearance: Appropriate for Environment  Eye Contact:Good  Speech:Clear and Coherent  Speech Volume:Normal  Handedness:No data recorded  Mood and Affect  Mood:Depressed  Affect:Congruent   Thought Process  Thought Processes:Coherent  Descriptions of Associations:Intact  Orientation:Full (Time, Place and Person)  Thought Content:Logical  History of Schizophrenia/Schizoaffective disorder:No  Duration of Psychotic Symptoms:No data recorded Hallucinations:No data recorded Ideas of Reference:None  Suicidal Thoughts:No data recorded Homicidal Thoughts:No data recorded  Sensorium  Memory:Immediate Good; Recent Good; Remote Good  Judgment:No data recorded Insight:Good   Executive Functions  Concentration:Good  Attention Span:Good  [place] of Knowledge:Good  Language:Good   Psychomotor Activity  Psychomotor Activity:No data recorded  Assets  Assets:Desire for Improvement; Armed forces logistics/support/administrative officer; Financial Resources/Insurance; Housing; Intimacy; Social Support; Resilience; Physical Health   Sleep  Sleep:No data recorded   Physical Exam: Physical Exam Vitals and nursing note reviewed.  Constitutional:      Appearance: Normal appearance. He is normal weight.  Neurological:     General: No focal  deficit present.     Mental Status: He is alert and oriented to person, place, and time.  Psychiatric:        Attention and Perception: Attention and perception normal.        Mood and Affect: Mood is anxious and depressed. Affect is flat.        Speech: Speech normal.        Behavior: Behavior normal. Behavior is cooperative.        Thought Content: Thought content normal.        Cognition and Memory: Memory normal. Cognition is impaired.        Judgment: Judgment normal.   Review of Systems  Constitutional: Negative.   HENT: Negative.    Eyes: Negative.   Respiratory: Negative.    Cardiovascular: Negative.   Gastrointestinal: Negative.   Genitourinary: Negative.   Musculoskeletal: Negative.   Skin: Negative.   Neurological: Negative.   Endo/Heme/Allergies: Negative.   Psychiatric/Behavioral:  Positive for depression. The patient is nervous/anxious.   Blood pressure (!) 150/85, pulse 69, temperature 97.8 F (36.6 C), resp. rate 18, SpO2 97 %. There is no height or weight on file to calculate BMI.   Treatment Plan Summary: Daily contact with patient to assess and evaluate symptoms and progress in treatment, Medication management, and Plan discontinue Restoril and glipizide and Neurontin.  Start trazodone as needed for sleep.  Start Depakote 125 mg every 8 hours.  Start Cozaar 50 mg daily.  Check blood sugars daily.  ECT consult.  Eckhart Mines, DO 03/25/2022, 10:40 AM

## 2022-03-25 NOTE — Progress Notes (Addendum)
03/25/22 0123  What Happened  Was fall witnessed? No  Was patient injured? Yes  Patient found in bathroom  Found by Staff-comment (Kina C, during 15 min safety rounds)  Stated prior activity bathroom-unassisted  Follow Up  MD notified Anette Riedel, NP  Time MD notified [place] notified No - patient refusal  Additional tests Yes-comment (CT w/o contrast)  Progress note created (see row info) Yes  Adult Fall Risk Assessment  Risk Factor Category (scoring not indicated) Fall has occurred during this admission (document High fall risk);High fall risk per protocol (document High fall risk)  Age 81  Fall History: Fall within 6 months prior to admission 0  Elimination; Bowel and/or Urine Incontinence 0  Elimination; Bowel and/or Urine Urgency/Frequency 0  Medications: includes PCA/Opiates, Anti-convulsants, Anti-hypertensives, Diuretics, Hypnotics, Laxatives, Sedatives, and Psychotropics 3  Patient Care Equipment 0  Mobility-Assistance 0  Mobility-Gait 0  Mobility-Sensory Deficit 2 (HOH)  Altered awareness of immediate physical environment 0  Impulsiveness 0  Lack of understanding of one's physical/cognitive limitations 0  Total Score 7  Patient Fall Risk Level High fall risk  Adult Fall Risk Interventions  Required Bundle Interventions *See Row Information* High fall risk - low, moderate, and high requirements implemented  Vitals  Temp 97.6 F (36.4 C)  BP (!) 142/80  MAP (mmHg) 98  BP Location Left Arm  BP Method Automatic  Patient Position (if appropriate) Sitting  Pulse Rate 64  Pulse Rate Source Monitor  Resp 18  Oxygen Therapy  SpO2 99 %  O2 Device Room Air  Pain Assessment  Pain Score 1  Pain Type Acute pain  Pain Location Face  Pain Orientation Left;Upper (laceration mid eyebrow L side of face)  Pain Onset Other (Comment) (after fall)  Pain Intervention(s) Emotional support  Multiple Pain Sites No  Neurological  Neuro (WDL) WDL  Level of  Consciousness Alert  Orientation Level Oriented X4  Musculoskeletal  Musculoskeletal (WDL) X  Assistive Device None  Weight Bearing Restrictions No  Integumentary  Integumentary (WDL) X  Skin Condition Dry  Skin Integrity Abrasion  Abrasion Location Eye  Abrasion Location Orientation Left (laceration mid eyebrow)  Abrasion Intervention Other (Comment) (assessed; not bleeding)  Pain Assessment  Date Pain First Started 03/25/22  Result of Injury Yes  Pain Assessment  Work-Related Injury No   At 0145, during q15 minute safety rounds, pt was in the bathroom. When asked if he was alright, pt mentioned that he had just fallen and hit his head. Pt states that he was in the bathroom and when he went to bend to sit on the toilet, he leaned forward too much and lost his balance, striking his head against the sink. Pt sustained a laceration to the middle of his eyebrow over his left eye. Pt said that his right knee was hurting. Area assessed and skin is intact without abrasion. Pt rates pain as 1/10. Pt has good ROM, pupils are reactive to light, sluggish. Grips +5 bilaterally. Head, neck palpated without pain. VS assessed. BP elevated but has been this way all day. Pt has no other complaints at this time.  Provider on call notified at [place]. Orders for head CT placed. Came to assess pt. AC notified at [place]. Pt CBG taken and it is 56. Per provider, 4 oz juice given to pt. Pt transported to CT. Pt returned from CT and vital signs reassessed. Pt did not want any family notified that he had this fall.  Pt VSS. Pt  in bed with call bell within reach. Per post fall algorithm, VS will be assessed again in 30 minutes. Pt refused pain medication.

## 2022-03-26 DIAGNOSIS — F332 Major depressive disorder, recurrent severe without psychotic features: Secondary | ICD-10-CM | POA: Diagnosis not present

## 2022-03-26 LAB — COMPREHENSIVE METABOLIC PANEL
ALT: 32 U/L (ref 0–44)
AST: 30 U/L (ref 15–41)
Albumin: 4.1 g/dL (ref 3.5–5.0)
Alkaline Phosphatase: 66 U/L (ref 38–126)
Anion gap: 8 (ref 5–15)
BUN: 15 mg/dL (ref 8–23)
CO2: 27 mmol/L (ref 22–32)
Calcium: 9.6 mg/dL (ref 8.9–10.3)
Chloride: 100 mmol/L (ref 98–111)
Creatinine, Ser: 0.83 mg/dL (ref 0.61–1.24)
GFR, Estimated: >60 mL/min (ref >60)
Glucose, Bld: 133 mg/dL — ABNORMAL HIGH (ref 70–99)
Potassium: 4.3 mmol/L (ref 3.5–5.1)
Sodium: 135 mmol/L (ref 135–145)
Total Bilirubin: 1.6 mg/dL — ABNORMAL HIGH (ref 0.3–1.2)
Total Protein: 7.1 g/dL (ref 6.5–8.1)

## 2022-03-26 LAB — GLUCOSE, CAPILLARY: Glucose-Capillary: 145 mg/dL — ABNORMAL HIGH (ref 70–99)

## 2022-03-26 NOTE — Progress Notes (Signed)
Recreation Therapy Notes  Date: 03/26/2022   Time: 1:30 pm     Location: Craft room    Behavioral response: N/A   Intervention Topic: Values    Discussion/Intervention: Patient refused to attend group.    Clinical Observations/Feedback:  Patient refused to attend group.    Elmina Hendel LRT/CTRS        [place] 03/26/2022 2:01 PM

## 2022-03-26 NOTE — Consult Note (Signed)
[facility] Face-to-Face Psychiatry Consult   Reason for Consult: Consult for 81 year old man with a history of recurrent major depression to be considered for ECT Referring Physician: Louis Meckel Patient Identification: John [name] MRN:  [id number] Principal Diagnosis: MDD (major depressive disorder) Diagnosis:  Principal Problem:   MDD (major depressive disorder)   Total Time spent with patient: 30 minutes  [name] is a 81 y.o. male patient admitted with "I have a lot of anxiety with this episode".  HPI: Patient interviewed and chart reviewed.  Spoke with primary psychiatrist Dr. Louis Meckel who requested an ECT consult for this patient.  81 year old man admitted to the hospital because of recent onset over the last few months of worsening depression and anxiety.  Patient describes negative mood and hopelessness much of the time.  Passive suicidal ideation without intent or plan.  Ruminations especially focusing on negative thoughts about his health.  Somatic symptoms including GI upset.  He feels like this particular episode started around the time he had hernia surgery.  He had been seeing his outpatient primary care doctor for medication management and seeing a therapist as well.  Currently the patient has been in the hospital for over a week and does not feel any different.  Seems to feel a lot of anxiety with frequent ruminations and worries especially about his own health and safety.  Denies hallucinations.  No evidence of psychosis.  No homicidal ideation.  Compliant with medicine.  Blood pressure running a little bit high.  Recent labs have not been done in a couple weeks but when they were nothing particularly out of the ordinary.  Patient has a history of recurrent depression going back almost lifelong.  Multiple prior hospitalizations.  One prior suicide attempt years ago.  Has been on multiple medicines but he does not remember details of it knows that he was taking venlafaxine  outside the hospital.  He does have a past history of having had ECT treatment over 20 years ago in [place] which she remembers as having been effective.  Denies substance abuse.  Medical history: No history of cardiac disease.  Mild diabetes currently with sugars well controlled.  Past history of carotid endarterectomy is successful.  Past history of a left frontal epidural hematoma which happened when he was 81 years old.  Some possible concern a couple years ago about what might have represented partial seizures and was transiently treated with anticonvulsant medicine but he is no longer on that medicine and the symptoms have completely resolved.  Has tolerated anesthesia well multiple times.  Patient is married lives with his wife.  Retired Chief Financial Officer.  Multiple positive things he enjoys in his usual day-to-day life.  Past Psychiatric History: See note above.  Longstanding chronic depression and anxiety  Risk to Self: What has been your use of drugs/alcohol within the last 12 months?: Pt denies. 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   Eczema    Elevated PSA    H/O Osgood-Schlatter disease    Hand deformity, congenital    Hearing loss bilateral   Hemorrhoids    Horner syndrome    Hypertension    Migraine    Obesity  PONV (postoperative nausea and vomiting)    Prostate cancer ([place]) 2020   Rosacea    Sleep apnea     Past Surgical History:  Procedure Laterality Date   ANKLE ARTHROSCOPY Left    fracture   CHOLECYSTECTOMY  2007   [place]   left frontal craniotomy for epidural hematoma   INSERTION OF MESH  01/02/2022   Procedure: INSERTION OF MESH;  Surgeon: Herbert Pun, MD;  Location: [facility] ORS;  Service:  General;;   LIGAMENT REPAIR Right    thumb   RETINAL DETACHMENT SURGERY     TREATMENT FISTULA ANAL  2006   Family History:  Family History  Problem Relation Age of Onset   Osteoporosis Mother    Depression Mother    Prostate cancer Father    Hypertension Father    Diabetes Brother    Kidney disease Neg Hx    Family Psychiatric  History: Some depression in the family Social History:  Social History   Substance and Sexual Activity  Alcohol Use Not Currently     Social History   Substance and Sexual Activity  Drug Use No    Social History   Socioeconomic History   Marital status: Married    Spouse name: [name]   Number of children: 2   Years of education: Not on file   Highest education level: Not on file  Occupational History   Not on file  Tobacco Use   Smoking status: Never   Smokeless tobacco: Never  Vaping Use   Vaping Use: Never used  Substance and Sexual Activity   Alcohol use: Not Currently   Drug use: No   Sexual activity: Yes    Birth control/protection: None  Other Topics Concern   Not on file  Social History Narrative   Live at home with wife.    Social Determinants of Health   Financial Resource Strain: Not on file  Food Insecurity: Not on file  Transportation Needs: Not on file  Physical Activity: Not on file  Stress: Not on file  Social Connections: Not on file   Additional Social History:    Allergies:  No Known Allergies  Labs:  Results for orders placed or performed during the hospital encounter of 03/13/22 (from the past 48 hour(s))  Glucose, capillary     Status: Abnormal   Collection Time: 03/25/22  1:42 AM  Result Value Ref Range   Glucose-Capillary 59 (L) 70 - 99 mg/dL    Comment: Glucose reference range applies only to samples taken after fasting for at least 8 hours.  Glucose, capillary     Status: None   Collection Time: 03/25/22  2:13 AM  Result Value Ref Range   Glucose-Capillary 96 70 - 99 mg/dL    Comment: Glucose  reference range applies only to samples taken after fasting for at least 8 hours.  Glucose, capillary     Status: Abnormal   Collection Time: 03/26/22 10:01 AM  Result Value Ref Range   Glucose-Capillary 145 (H) 70 - 99 mg/dL    Comment: Glucose reference range applies only to samples taken after fasting for at least 8 hours.    Current Facility-Administered Medications  Medication Dose Route Frequency Provider Last Rate Last Admin   acetaminophen (TYLENOL) tablet 650 mg  650 mg Oral Q6H PRN Sherlon Handing, NP       alum & mag hydroxide-simeth (MAALOX/MYLANTA) 200-200-20 MG/5ML suspension 30 mL  30 mL Oral Q4H PRN Sherlon Handing, NP  LORazepam (ATIVAN) tablet 0.5 mg  0.5 mg Oral Q4H PRN Parks Ranger, DO   0.5 mg at 03/26/22 7628   losartan (COZAAR) tablet 50 mg  50 mg Oral Daily Parks Ranger, DO   50 mg at 03/26/22 1005   magnesium hydroxide (MILK OF MAGNESIA) suspension 30 mL  30 mL Oral Daily PRN Waldon Merl F, NP   30 mL at 03/24/22 2017   metoprolol succinate (TOPROL-XL) 24 hr tablet 50 mg  50 mg Oral QPC breakfast Parks Ranger, DO   50 mg at 03/26/22 3151   tamsulosin (FLOMAX) capsule 0.4 mg  0.4 mg Oral Q2200 Caroline Sauger, NP   0.4 mg at 03/25/22 2119   traZODone (DESYREL) tablet 50 mg  50 mg Oral QHS PRN Parks Ranger, DO   50 mg at 03/25/22 2119   venlafaxine XR (EFFEXOR-XR) 24 hr capsule 300 mg  300 mg Oral QPC breakfast Parks Ranger, DO   300 mg at 03/26/22 7616    Musculoskeletal: Strength & Muscle Tone: within normal limits Gait & Station: normal Patient leans: N/A            Psychiatric Specialty Exam:  Presentation  General Appearance: Appropriate for Environment  Eye Contact:Good  Speech:Clear and Coherent  Speech Volume:Normal  Handedness:No data recorded  Mood and Affect  Mood:Depressed  Affect:Congruent   Thought Process  Thought Processes:Coherent  Descriptions  of Associations:Intact  Orientation:Full (Time, Place and Person)  Thought Content:Logical  History of Schizophrenia/Schizoaffective disorder:No  Duration of Psychotic Symptoms:No data recorded Hallucinations:No data recorded Ideas of Reference:None  Suicidal Thoughts:No data recorded Homicidal Thoughts:No data recorded  Sensorium  Memory:Immediate Good; Recent Good; Remote Good  Judgment:No data recorded Insight:Good   Executive Functions  Concentration:Good  Attention Span:Good  [place]  Language:Good   Psychomotor Activity  Psychomotor Activity:No data recorded  Assets  Assets:Desire for Improvement; Armed forces logistics/support/administrative officer; Financial Resources/Insurance; Housing; Intimacy; Social Support; Resilience; Physical Health   Sleep  Sleep:No data recorded  Physical Exam: Physical Exam Vitals and nursing note reviewed.  Constitutional:      Appearance: Normal appearance.  HENT:     Head: Normocephalic and atraumatic.     Mouth/Throat:     Pharynx: Oropharynx is clear.  Eyes:     Pupils: Pupils are equal, round, and reactive to light.  Cardiovascular:     Rate and Rhythm: Normal rate and regular rhythm.  Pulmonary:     Effort: Pulmonary effort is normal.     Breath sounds: Normal breath sounds.  Abdominal:     General: Abdomen is flat.     Palpations: Abdomen is soft.  Musculoskeletal:        General: Normal range of motion.  Skin:    General: Skin is warm and dry.  Neurological:     General: No focal deficit present.     Mental Status: He is alert. Mental status is at baseline.  Psychiatric:        Attention and Perception: Attention normal.        Mood and Affect: Mood is anxious and depressed.        Speech: Speech normal.        Behavior: Behavior is cooperative.        Thought Content: Thought content includes suicidal ideation. Thought content does not include suicidal plan.        Cognition and Memory: Cognition  normal.        Judgment: Judgment normal.  Review of Systems  Constitutional: Negative.   HENT: Negative.    Eyes: Negative.   Respiratory: Negative.    Cardiovascular: Negative.   Gastrointestinal: Negative.   Musculoskeletal: Negative.   Skin: Negative.   Neurological: Negative.   Psychiatric/Behavioral:  Positive for depression. Negative for hallucinations, substance abuse and suicidal ideas. The patient is nervous/anxious.   Blood pressure (!) 134/92, pulse 75, temperature 97.9 F (36.6 C), resp. rate 18, SpO2 98 %. There is no height or weight on file to calculate BMI.  Treatment Plan Summary: Medication management and Plan a 81 year old man with longstanding chronic recurrent severe depression and anxiety causing significant functional impairment.  Not responding currently to current medication treatment and has a good history of response to ECT.  Patient has no significant contraindications to ECT.  He has had the treatment before and tolerated it well.  He has no history of cardiac disease and has a good history of tolerating anesthesia.  Most recent brain scan was unremarkable.  The epidural hematoma happened when he was a child and seems incidental with fully recovered craniotomy.  I recommended to the patient that he would be a reasonable candidate for ECT based on the fact that ECT is highly effective for treating depression and his previous tolerance.  Patient was given ample opportunity to ask questions and stated that he would be agreeable to starting treatment tomorrow.  It was explained that we would discontinue Depakote and try to hold Ativan in the morning.  He would be n.p.o. after midnight tonight.  I will go ahead and place orders and let Dr. Louis Meckel know.  Plan will be for right unilateral treatment to minimize cognitive complaints.  Beginning is inpatient possibly switching to outpatient if he starts to improve  Disposition:  See note above.  Recommend ECT starting  tomorrow.  [name], MD 03/26/2022 12:02 PM

## 2022-03-26 NOTE — Progress Notes (Signed)
[facility] MD Progress Note  03/26/2022 12:13 PM John [name]  MRN:  [id number] Subjective: John [name] is seen on rounds.  We started him on Depakote yesterday to help with his anxiety.  So far, he does not notice a difference.  He is tolerating it though without any problems.  He states that he slept well last night.  He did take as needed trazodone.  He has done well on regular Effexor for years so I increased it when he presented with increased depression and anxiety and changed it to the extended release.  He is on the max dose of 300 mg.  Seems to be tolerating that well.  Dr. Weber Cooks saw him for ECT consult and they are going to start tomorrow morning.  Caliber was in agreement.  He did well with the 20 years ago.  He is still very depressed and anxious.  Principal Problem: MDD (major depressive disorder) Diagnosis: Principal Problem:   MDD (major depressive disorder)  Total Time spent with patient: 15 minutes  Past Psychiatric History:  Patient has long history of anxiety and depression, which he identifies as stemming from a head injury at 81 years of age.  He has been functioning very well throughout his life.  He is a retired Chief Financial Officer.  He has been on a low-dose of venlafaxine for many years.  He has had a few hospitalizations, the last being in the 1990s.  The only suicide attempt that he describes is putting a bag over his head many years ago, but then he took it off.  He sees his primary care physician for medication management and has had a therapist for many years and knew "spiritual director" for the last year and a half, with whom he has a good relationship.  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   Eczema    Elevated PSA    H/O Osgood-Schlatter disease    Hand deformity, congenital    Hearing loss bilateral   Hemorrhoids     Horner syndrome    Hypertension    Migraine    Obesity    PONV (postoperative nausea and vomiting)    Prostate cancer ([place]) 2020   Rosacea    Sleep apnea     Past Surgical History:  Procedure Laterality Date   ANKLE ARTHROSCOPY Left    fracture   CHOLECYSTECTOMY  2007   CRANIOTOMY  1958   left frontal craniotomy for epidural hematoma   INSERTION OF MESH  01/02/2022   Procedure: INSERTION OF MESH;  Surgeon: Herbert Pun, MD;  Location: [facility] ORS;  Service: General;;   LIGAMENT REPAIR Right    thumb   RETINAL DETACHMENT SURGERY     TREATMENT FISTULA ANAL  2006   Family History:  Family History  Problem Relation Age of Onset   Osteoporosis Mother    Depression Mother    Prostate cancer Father    Hypertension Father    Diabetes Brother    Kidney disease Neg Hx     Social History:  Social History   Substance and Sexual Activity  Alcohol Use Not Currently     Social History   Substance and Sexual Activity  Drug Use No    Social History   Socioeconomic History   Marital status: Married  Spouse name: [name]   Number of children: 2   Years of education: Not on file   Highest education level: Not on file  Occupational History   Not on file  Tobacco Use   Smoking status: Never   Smokeless tobacco: Never  Vaping Use   Vaping Use: Never used  Substance and Sexual Activity   Alcohol use: Not Currently   Drug use: No   Sexual activity: Yes    Birth control/protection: None  Other Topics Concern   Not on file  Social History Narrative   Live at home with wife.    Social Determinants of Health   Financial Resource Strain: Not on file  Food Insecurity: Not on file  Transportation Needs: Not on file  Physical Activity: Not on file  Stress: Not on file  Social Connections: Not on file   Additional Social History:                         Sleep: Good  Appetite:  Good  Current Medications: Current Facility-Administered Medications   Medication Dose Route Frequency Provider Last Rate Last Admin   acetaminophen (TYLENOL) tablet 650 mg  650 mg Oral Q6H PRN Sherlon Handing, NP       alum & mag hydroxide-simeth (MAALOX/MYLANTA) 200-200-20 MG/5ML suspension 30 mL  30 mL Oral Q4H PRN Waldon Merl F, NP       LORazepam (ATIVAN) tablet 0.5 mg  0.5 mg Oral Q4H PRN Parks Ranger, DO   0.5 mg at 03/26/22 9169   losartan (COZAAR) tablet 50 mg  50 mg Oral Daily Parks Ranger, DO   50 mg at 03/26/22 1005   magnesium hydroxide (MILK OF MAGNESIA) suspension 30 mL  30 mL Oral Daily PRN Waldon Merl F, NP   30 mL at 03/24/22 2017   metoprolol succinate (TOPROL-XL) 24 hr tablet 50 mg  50 mg Oral QPC breakfast Parks Ranger, DO   50 mg at 03/26/22 4503   tamsulosin (FLOMAX) capsule 0.4 mg  0.4 mg Oral Q2200 Caroline Sauger, NP   0.4 mg at 03/25/22 2119   traZODone (DESYREL) tablet 50 mg  50 mg Oral QHS PRN Parks Ranger, DO   50 mg at 03/25/22 2119   venlafaxine XR (EFFEXOR-XR) 24 hr capsule 300 mg  300 mg Oral QPC breakfast Parks Ranger, DO   300 mg at 03/26/22 8882    Lab Results:  Results for orders placed or performed during the hospital encounter of 03/13/22 (from the past 48 hour(s))  Glucose, capillary     Status: Abnormal   Collection Time: 03/25/22  1:42 AM  Result Value Ref Range   Glucose-Capillary 59 (L) 70 - 99 mg/dL    Comment: Glucose reference range applies only to samples taken after fasting for at least 8 hours.  Glucose, capillary     Status: None   Collection Time: 03/25/22  2:13 AM  Result Value Ref Range   Glucose-Capillary 96 70 - 99 mg/dL    Comment: Glucose reference range applies only to samples taken after fasting for at least 8 hours.  Glucose, capillary     Status: Abnormal   Collection Time: 03/26/22 10:01 AM  Result Value Ref Range   Glucose-Capillary 145 (H) 70 - 99 mg/dL    Comment: Glucose reference range applies only to samples taken  after fasting for at least 8 hours.    Blood Alcohol level:  Lab Results  Component Value Date   ETH <10 14/48/1856    Metabolic Disorder Labs: Lab Results  Component Value Date   HGBA1C 5.6 03/19/2022   MPG 114.02 03/19/2022   No results found for: PROLACTIN Lab Results  Component Value Date   CHOL 131 03/19/2022   TRIG 92 03/19/2022   HDL 33 (L) 03/19/2022   CHOLHDL 4.0 03/19/2022   VLDL 18 03/19/2022   LDLCALC 80 03/19/2022    Physical Findings: AIMS:  , ,  ,  ,    CIWA:    COWS:     Musculoskeletal: Strength & Muscle Tone: within normal limits Gait & Station: normal Patient leans: N/A  Psychiatric Specialty Exam:  Presentation  General Appearance: Appropriate for Environment  Eye Contact:Good  Speech:Clear and Coherent  Speech Volume:Normal  Handedness:No data recorded  Mood and Affect  Mood:Depressed  Affect:Congruent   Thought Process  Thought Processes:Coherent  Descriptions of Associations:Intact  Orientation:Full (Time, Place and Person)  Thought Content:Logical  History of Schizophrenia/Schizoaffective disorder:No  Duration of Psychotic Symptoms:No data recorded Hallucinations:No data recorded Ideas of Reference:None  Suicidal Thoughts:No data recorded Homicidal Thoughts:No data recorded  Sensorium  Memory:Immediate Good; Recent Good; Remote Good  Judgment:No data recorded Insight:Good   Executive Functions  Concentration:Good  Attention Span:Good  [place] of Knowledge:Good  Language:Good   Psychomotor Activity  Psychomotor Activity:No data recorded  Assets  Assets:Desire for Improvement; Armed forces logistics/support/administrative officer; Financial Resources/Insurance; Housing; Intimacy; Social Support; Resilience; Physical Health   Sleep  Sleep:No data recorded   Physical Exam: Physical Exam Vitals and nursing note reviewed.  Constitutional:      Appearance: Normal appearance. He is normal weight.  Neurological:      General: No focal deficit present.     Mental Status: He is alert and oriented to person, place, and time.  Psychiatric:        Attention and Perception: Attention and perception normal.        Mood and Affect: Mood is anxious and depressed. Affect is flat.        Speech: Speech is delayed.        Behavior: Behavior normal. Behavior is cooperative.        Thought Content: Thought content normal.        Cognition and Memory: Memory normal. Cognition is impaired.        Judgment: Judgment normal.   Review of Systems  Constitutional: Negative.   HENT: Negative.    Eyes: Negative.   Respiratory: Negative.    Cardiovascular: Negative.   Gastrointestinal: Negative.   Genitourinary: Negative.   Musculoskeletal: Negative.   Skin: Negative.   Neurological: Negative.   Endo/Heme/Allergies: Negative.   Psychiatric/Behavioral:  Positive for depression. The patient is nervous/anxious.   Blood pressure (!) 134/92, pulse 75, temperature 97.9 F (36.6 C), resp. rate 18, SpO2 98 %. There is no height or weight on file to calculate BMI.   Treatment Plan Summary: Daily contact with patient to assess and evaluate symptoms and progress in treatment, Medication management, and Plan to start ECT tomorrow.  Continue current medications.  Parks Ranger, DO 03/26/2022, 12:13 PM

## 2022-03-26 NOTE — Plan of Care (Signed)
Pt continues to present Flat and Preoccupied.  Pt denies SI/HI/AV/H and contracts for safety.  Pt did endorse Anxiety/Depression 5/10. Pt did verbalize anxiety about ECT Tx.  Informed pt to remain NPO at midnight. Compliant with scheduled medications. Pt verbalized understanding of care plan.  Pt did have hs snack and adequate fluids.  Q 15 minute safety checks in place.  Will continue plan of care.      Problem: Education: Goal: Utilization of techniques to improve thought processes will improve Outcome: Progressing Goal: Knowledge of the prescribed therapeutic regimen will improve Outcome: Progressing   Problem: Activity: Goal: Interest or engagement in leisure activities will improve Outcome: Progressing Goal: Imbalance in normal sleep/wake cycle will improve Outcome: Progressing

## 2022-03-26 NOTE — Plan of Care (Signed)
Patient remain alert and oriented, calm and cooperative during assessment. Observed with clean dry steri strip on left eyebrow. Denies pain or discomfort at this time. Denies SI, HI, and AVH. Endorsed anxiety 7/10 about the ECT procedure and the medication he's taken. Endorsed depression of 5/10. Blood pressure this morning was 159/106 BP meds administered as ordered. When recheck BP went down to 134/92. Ate breakfast in the day room among peers with good appetite. Compliant with due medications. Patient remain safe on the unit at this time.  Problem: Education: Goal: Utilization of techniques to improve thought processes will improve Outcome: Progressing Goal: Knowledge of the prescribed therapeutic regimen will improve Outcome: Progressing   Problem: Activity: Goal: Interest or engagement in leisure activities will improve Outcome: Progressing Goal: Imbalance in normal sleep/wake cycle will improve Outcome: Progressing   Problem: Coping: Goal: Coping ability will improve Outcome: Progressing Goal: Will verbalize feelings Outcome: Progressing   Problem: Health Behavior/Discharge Planning: Goal: Ability to make decisions will improve Outcome: Progressing Goal: Compliance with therapeutic regimen will improve Outcome: Progressing   Problem: Role Relationship: Goal: Will demonstrate positive changes in social behaviors and relationships Outcome: Progressing   Problem: Safety: Goal: Ability to disclose and discuss suicidal ideas will improve Outcome: Progressing Goal: Ability to identify and utilize support systems that promote safety will improve Outcome: Progressing   Problem: Self-Concept: Goal: Will verbalize positive feelings about self Outcome: Progressing Goal: Level of anxiety will decrease Outcome: Progressing

## 2022-03-27 ENCOUNTER — Encounter: Payer: Self-pay | Admitting: Psychiatry

## 2022-03-27 ENCOUNTER — Ambulatory Visit: Payer: Medicare Other | Attending: Psychiatry

## 2022-03-27 ENCOUNTER — Inpatient Hospital Stay: Payer: Medicare Other | Admitting: Registered Nurse

## 2022-03-27 DIAGNOSIS — F332 Major depressive disorder, recurrent severe without psychotic features: Secondary | ICD-10-CM | POA: Diagnosis not present

## 2022-03-27 MED ORDER — MIDAZOLAM HCL 2 MG/2ML IJ SOLN
INTRAMUSCULAR | Status: DC | PRN
  Administered 2022-03-27: 2 mg via INTRAVENOUS

## 2022-03-27 MED ORDER — SUCCINYLCHOLINE CHLORIDE 200 MG/10ML IV SOSY
PREFILLED_SYRINGE | INTRAVENOUS | Status: DC | PRN
  Administered 2022-03-27: 100 mg via INTRAVENOUS

## 2022-03-27 MED ORDER — LABETALOL HCL 5 MG/ML IV SOLN
INTRAVENOUS | Status: AC
  Filled 2022-03-27: qty 4

## 2022-03-27 MED ORDER — METHOHEXITAL SODIUM 100 MG/10ML IV SOSY
PREFILLED_SYRINGE | INTRAVENOUS | Status: DC | PRN
  Administered 2022-03-27: 90 mg via INTRAVENOUS

## 2022-03-27 MED ORDER — SODIUM CHLORIDE 0.9 % IV SOLN: INTRAVENOUS | Status: DC | PRN

## 2022-03-27 MED ORDER — EPHEDRINE SULFATE (PRESSORS) 50 MG/ML IJ SOLN
INTRAMUSCULAR | Status: DC | PRN
  Administered 2022-03-27: 7.5 mg via INTRAVENOUS

## 2022-03-27 MED ORDER — MIDAZOLAM HCL 2 MG/2ML IJ SOLN
INTRAMUSCULAR | Status: AC
  Filled 2022-03-27: qty 2

## 2022-03-27 NOTE — Progress Notes (Signed)
[facility] MD Progress Note  03/27/2022 11:25 AM John [name]  MRN:  [id number] Subjective: Timothee continues to be very anxious and depressed.  He is on the schedule for ECT today and I asked him if that was part of the problem and he said yes.  I told him after he received his first treatment that he will know what to expect and should not be so anxious.  He is taking his medications as prescribed and denies any side effects.  I reassured him that his medications take time and that ECT will help.  Principal Problem: MDD (major depressive disorder) Diagnosis: Principal Problem:   MDD (major depressive disorder)  Total Time spent with patient: 15 minutes  Past Psychiatric History:  Patient has long history of anxiety and depression, which he identifies as stemming from a head injury at 81 years of age.  He has been functioning very well throughout his life.  He is a retired Chief Financial Officer.  He has been on a low-dose of venlafaxine for many years.  He has had a few hospitalizations, the last being in the 1990s.  The only suicide attempt that he describes is putting a bag over his head many years ago, but then he took it off.  He sees his primary care physician for medication management and has had a therapist for many years and knew "spiritual director" for the last year and a half, with whom he has a good relationship.  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   Eczema    Elevated PSA    H/O Osgood-Schlatter disease    Hand deformity, congenital    Hearing loss bilateral   Hemorrhoids    Horner syndrome    Hypertension    Migraine    Obesity    PONV (postoperative nausea and vomiting)    Prostate cancer ([place]) 2020   Rosacea    Sleep apnea     Past Surgical History:  Procedure Laterality Date   ANKLE ARTHROSCOPY Left    fracture    CHOLECYSTECTOMY  2007   CRANIOTOMY  1958   left frontal craniotomy for epidural hematoma   INSERTION OF MESH  01/02/2022   Procedure: INSERTION OF MESH;  Surgeon: Herbert Pun, MD;  Location: [facility] ORS;  Service: General;;   LIGAMENT REPAIR Right    thumb   RETINAL DETACHMENT SURGERY     TREATMENT FISTULA ANAL  2006   Family History:  Family History  Problem Relation Age of Onset   Osteoporosis Mother    Depression Mother    Prostate cancer Father    Hypertension Father    Diabetes Brother    Kidney disease Neg Hx    Social History:  Social History   Substance and Sexual Activity  Alcohol Use Not Currently     Social History   Substance and Sexual Activity  Drug Use No    Social History   Socioeconomic History   Marital status: Married    Spouse name: [name]   Number of children: 2   Years of education: Not on file   Highest education level: Not on file  Occupational History   Not on file  Tobacco Use   Smoking status: Never   Smokeless tobacco: Never  Vaping Use   Vaping Use: Never  used  Substance and Sexual Activity   Alcohol use: Not Currently   Drug use: No   Sexual activity: Yes    Birth control/protection: None  Other Topics Concern   Not on file  Social History Narrative   Live at home with wife.    Social Determinants of Health   Financial Resource Strain: Not on file  Food Insecurity: Not on file  Transportation Needs: Not on file  Physical Activity: Not on file  Stress: Not on file  Social Connections: Not on file   Additional Social History:                         Sleep: Good  Appetite:  Good  Current Medications: Current Facility-Administered Medications  Medication Dose Route Frequency Provider Last Rate Last Admin   acetaminophen (TYLENOL) tablet 650 mg  650 mg Oral Q6H PRN Sherlon Handing, NP       alum & mag hydroxide-simeth (MAALOX/MYLANTA) 200-200-20 MG/5ML suspension 30 mL  30 mL Oral Q4H PRN  Waldon Merl F, NP       LORazepam (ATIVAN) tablet 0.5 mg  0.5 mg Oral Q4H PRN Parks Ranger, DO   0.5 mg at 03/26/22 2110   losartan (COZAAR) tablet 50 mg  50 mg Oral Daily Parks Ranger, DO   50 mg at 03/27/22 0905   magnesium hydroxide (MILK OF MAGNESIA) suspension 30 mL  30 mL Oral Daily PRN Waldon Merl F, NP   30 mL at 03/24/22 2017   metoprolol succinate (TOPROL-XL) 24 hr tablet 50 mg  50 mg Oral QPC breakfast Parks Ranger, DO   50 mg at 03/27/22 0906   tamsulosin (FLOMAX) capsule 0.4 mg  0.4 mg Oral Q2200 Caroline Sauger, NP   0.4 mg at 03/26/22 2110   traZODone (DESYREL) tablet 50 mg  50 mg Oral QHS PRN Parks Ranger, DO   50 mg at 03/26/22 2110   venlafaxine XR (EFFEXOR-XR) 24 hr capsule 300 mg  300 mg Oral QPC breakfast Parks Ranger, DO   300 mg at 03/26/22 6045    Lab Results:  Results for orders placed or performed during the hospital encounter of 03/13/22 (from the past 48 hour(s))  Glucose, capillary     Status: Abnormal   Collection Time: 03/26/22 10:01 AM  Result Value Ref Range   Glucose-Capillary 145 (H) 70 - 99 mg/dL    Comment: Glucose reference range applies only to samples taken after fasting for at least 8 hours.  Comprehensive metabolic panel     Status: Abnormal   Collection Time: 03/26/22 12:26 PM  Result Value Ref Range   Sodium 135 135 - 145 mmol/L   Potassium 4.3 3.5 - 5.1 mmol/L   Chloride 100 98 - 111 mmol/L   CO2 27 22 - 32 mmol/L   Glucose, Bld 133 (H) 70 - 99 mg/dL    Comment: Glucose reference range applies only to samples taken after fasting for at least 8 hours.   BUN 15 8 - 23 mg/dL   Creatinine, Ser 0.83 0.61 - 1.24 mg/dL   Calcium 9.6 8.9 - 10.3 mg/dL   Total Protein 7.1 6.5 - 8.1 g/dL   Albumin 4.1 3.5 - 5.0 g/dL   AST 30 15 - 41 U/L   ALT 32 0 - 44 U/L   Alkaline Phosphatase 66 38 - 126 U/L   Total Bilirubin 1.6 (H) 0.3 - 1.2 mg/dL   GFR,  Estimated >60 >60 mL/min    Comment:  (NOTE) Calculated using the CKD-EPI Creatinine Equation (2021)    Anion gap 8 5 - 15    Comment: Performed at [facility], [address] [place], [place] [address]    Blood Alcohol level:  Lab Results  Component Value Date   [facility] <10 40/97/3532    Metabolic Disorder Labs: Lab Results  Component Value Date   HGBA1C 5.6 03/19/2022   MPG 114.02 03/19/2022   No results found for: PROLACTIN Lab Results  Component Value Date   CHOL 131 03/19/2022   TRIG 92 03/19/2022   HDL 33 (L) 03/19/2022   CHOLHDL 4.0 03/19/2022   VLDL 18 03/19/2022   LDLCALC 80 03/19/2022    Physical Findings: AIMS:  , ,  ,  ,    CIWA:    COWS:     Musculoskeletal: Strength & Muscle Tone: within normal limits Gait & Station: normal Patient leans: N/A  Psychiatric Specialty Exam:  Presentation  General Appearance: Appropriate for Environment  Eye Contact:Good  Speech:Clear and Coherent  Speech Volume:Normal  Handedness:No data recorded  Mood and Affect  Mood:Depressed  Affect:Congruent   Thought Process  Thought Processes:Coherent  Descriptions of Associations:Intact  Orientation:Full (Time, Place and Person)  Thought Content:Logical  History of Schizophrenia/Schizoaffective disorder:No  Duration of Psychotic Symptoms:No data recorded Hallucinations:No data recorded Ideas of Reference:None  Suicidal Thoughts:No data recorded Homicidal Thoughts:No data recorded  Sensorium  Memory:Immediate Good; Recent Good; Remote Good  Judgment:No data recorded Insight:Good   Executive Functions  Concentration:Good  Attention Span:Good  [place] of Knowledge:Good  Language:Good   Psychomotor Activity  Psychomotor Activity:No data recorded  Assets  Assets:Desire for Improvement; Armed forces logistics/support/administrative officer; Financial Resources/Insurance; Housing; Intimacy; Social Support; Resilience; Physical Health   Sleep  Sleep:No data recorded   Physical  Exam: Physical Exam Vitals and nursing note reviewed.  Constitutional:      Appearance: Normal appearance. He is normal weight.  Neurological:     General: No focal deficit present.     Mental Status: He is alert and oriented to person, place, and time.  Psychiatric:        Attention and Perception: Attention and perception normal.        Mood and Affect: Mood is anxious and depressed. Affect is flat.        Speech: Speech normal.        Behavior: Behavior is agitated. Behavior is cooperative.        Thought Content: Thought content normal.        Cognition and Memory: Cognition and memory normal.        Judgment: Judgment normal.   Review of Systems  Constitutional: Negative.   HENT: Negative.    Eyes: Negative.   Respiratory: Negative.    Cardiovascular: Negative.   Gastrointestinal: Negative.   Genitourinary: Negative.   Musculoskeletal: Negative.   Skin: Negative.   Neurological: Negative.   Endo/Heme/Allergies: Negative.   Psychiatric/Behavioral:  Positive for depression. The patient is nervous/anxious.   Blood pressure (!) 163/97, pulse 77, temperature 98.1 F (36.7 C), resp. rate 18, SpO2 100 %. There is no height or weight on file to calculate BMI.   Treatment Plan Summary: Daily contact with patient to assess and evaluate symptoms and progress in treatment, Medication management, and Plan continue current medications.  ECT today.  Parks Ranger, DO 03/27/2022, 11:25 AM

## 2022-03-27 NOTE — H&P (Signed)
John [name] is an 81 y.o. male.   Chief Complaint: Patient is very anxious this morning.  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   Eczema    Elevated PSA    H/O Osgood-Schlatter disease    Hand deformity, congenital    Hearing loss bilateral   Hemorrhoids    Horner syndrome    Hypertension    Migraine    Obesity    PONV (postoperative nausea and vomiting)    Prostate cancer ([place]) 2020   Rosacea    Sleep apnea     Past Surgical History:  Procedure Laterality Date   ANKLE ARTHROSCOPY Left    fracture   CHOLECYSTECTOMY  2007   CRANIOTOMY  1958   left frontal craniotomy for epidural hematoma   INSERTION OF MESH  01/02/2022   Procedure: INSERTION OF MESH;  Surgeon: Herbert Pun, MD;  Location: [facility] ORS;  Service: General;;   LIGAMENT REPAIR Right    thumb   RETINAL DETACHMENT SURGERY     TREATMENT FISTULA ANAL  2006    Family History  Problem Relation Age of Onset   Osteoporosis Mother    Depression Mother    Prostate cancer Father    Hypertension Father    Diabetes Brother    Kidney disease Neg Hx    Social History:  reports that he has never smoked. He has never used smokeless tobacco. He reports that he does not currently use alcohol. He reports that he does not use drugs.  Allergies: No Known Allergies  Facility-Administered Medications Prior to Admission  Medication Dose Route Frequency Provider Last Rate Last Admin   gentamicin (GARAMYCIN) injection 80 mg  80 mg Intramuscular Once Hollice Espy, MD       levofloxacin [facility]) tablet 500 mg  500 mg Oral Once Hollice Espy, MD       Medications Prior to Admission  Medication Sig Dispense Refill   aspirin EC 81 MG tablet Take 81 mg by mouth  daily.     atenolol (TENORMIN) 50 MG tablet Take 50 mg by mouth daily.  0   buPROPion ER (WELLBUTRIN SR) 100 MG 12 hr tablet Take 100 mg by mouth 2 (two) times daily.     glipiZIDE (GLUCOTROL) 5 MG tablet Take 10 mg by mouth 2 (two) times daily before a meal.     oxyCODONE-acetaminophen (PERCOCET) 5-325 MG tablet Take 1 tablet by mouth every 4 (four) hours as needed for severe pain. (Patient not taking: Reported on 03/12/2022) 20 tablet 0   SUMAtriptan (IMITREX) 100 MG tablet TAKE 1 TABLET BY MOUTH ONCE AS NEEDED FOR MIGRAINE. MAY REPEAT 2ND DOSE AFTER 2 HOUR IF NEEDED     tamsulosin (FLOMAX) 0.4 MG CAPS capsule TAKE 1 CAPSULE (0.4 MG TOTAL) BY MOUTH DAILY AFTER SUPPER. 90 capsule 3   venlafaxine (EFFEXOR) 37.5 MG tablet Take 75 mg by mouth daily.      Results for orders placed or performed during the hospital encounter of 03/13/22 (from the past 48 hour(s))  Glucose, capillary     Status: Abnormal   Collection Time: 03/26/22 10:01 AM  Result Value Ref Range   Glucose-Capillary 145 (H) 70 - 99 mg/dL    Comment: Glucose reference  range applies only to samples taken after fasting for at least 8 hours.  Comprehensive metabolic panel     Status: Abnormal   Collection Time: 03/26/22 12:26 PM  Result Value Ref Range   Sodium 135 135 - 145 mmol/L   Potassium 4.3 3.5 - 5.1 mmol/L   Chloride 100 98 - 111 mmol/L   CO2 27 22 - 32 mmol/L   Glucose, Bld 133 (H) 70 - 99 mg/dL    Comment: Glucose reference range applies only to samples taken after fasting for at least 8 hours.   BUN 15 8 - 23 mg/dL   Creatinine, Ser 0.83 0.61 - 1.24 mg/dL   Calcium 9.6 8.9 - 10.3 mg/dL   Total Protein 7.1 6.5 - 8.1 g/dL   Albumin 4.1 3.5 - 5.0 g/dL   AST 30 15 - 41 U/L   ALT 32 0 - 44 U/L   Alkaline Phosphatase 66 38 - 126 U/L   Total Bilirubin 1.6 (H) 0.3 - 1.2 mg/dL   GFR, Estimated >60 >60 mL/min    Comment: (NOTE) Calculated using the CKD-EPI Creatinine Equation (2021)    Anion gap 8 5 - 15    Comment:  Performed at [facility] [address], [place]., [place],  [address]   No results found.  Review of Systems  Constitutional: Negative.   HENT: Negative.    Eyes: Negative.   Respiratory: Negative.    Cardiovascular: Negative.   Gastrointestinal: Negative.   Musculoskeletal: Negative.   Skin: Negative.   Neurological: Negative.   Psychiatric/Behavioral: Negative.    All other systems reviewed and are negative.  Blood pressure (!) 159/105, pulse 79, temperature (!) 97.1 F (36.2 C), resp. rate 13, height '6\' 2"'$  (1.88 m), SpO2 98 %. Physical Exam Vitals and nursing note reviewed.  Constitutional:      Appearance: He is well-developed.  HENT:     Head: Normocephalic and atraumatic.  Eyes:     Conjunctiva/sclera: Conjunctivae normal.     Pupils: Pupils are equal, round, and reactive to light.  Cardiovascular:     Heart sounds: Normal heart sounds.  Pulmonary:     Effort: Pulmonary effort is normal.  Abdominal:     Palpations: Abdomen is soft.  Musculoskeletal:        General: Normal range of motion.     Cervical back: Normal range of motion.  Skin:    General: Skin is warm and dry.  Neurological:     General: No focal deficit present.     Mental Status: He is alert.  Psychiatric:        Mood and Affect: Mood normal.     Assessment/Plan Begin a right unilateral treatment first treatment today.  [name], MD 03/27/2022, 1:51 PM

## 2022-03-27 NOTE — Anesthesia Postprocedure Evaluation (Signed)
Anesthesia Post Note  Patient: John [name]  Procedure(s) Performed: ECT TX  Patient location during evaluation: PACU Anesthesia Type: General Level of consciousness: awake and alert Pain management: pain level controlled Vital Signs Assessment: post-procedure vital signs reviewed and stable Respiratory status: spontaneous breathing, nonlabored ventilation, respiratory function stable and patient connected to nasal cannula oxygen Cardiovascular status: blood pressure returned to baseline and stable Postop Assessment: no apparent nausea or vomiting Anesthetic complications: no   No notable events documented.   Last Vitals:  Vitals:   03/27/22 1220 03/27/22 1245  BP: (!) 117/100 (!) 150/79  Pulse: 71 79  Resp: 18 18  Temp: 36.7 C (!) 36.2 C  SpO2: 100% 100%    Last Pain:  Vitals:   03/27/22 1245  TempSrc:   PainSc: Asleep                 Arita Miss

## 2022-03-27 NOTE — Transfer of Care (Signed)
Immediate Anesthesia Transfer of Care Note  Patient: John [name]  Procedure(s) Performed: ECT TX  Patient Location: PACU  Anesthesia Type:General  Level of Consciousness: awake, alert  and oriented  Airway & Oxygen Therapy: Patient Spontanous Breathing  Post-op Assessment: Report given to RN and Post -op Vital signs reviewed and stable  Post vital signs: Reviewed and stable  Last Vitals:  Vitals Value Taken Time  BP 150/79 03/27/22 1245  Temp 36.2 C 03/27/22 1245  Pulse 79 03/27/22 1251  Resp 20 03/27/22 1251  SpO2 100 % 03/27/22 1251  Vitals shown include unvalidated device data.  Last Pain:  Vitals:   03/27/22 1245  TempSrc:   PainSc: Asleep         Complications: No notable events documented.

## 2022-03-27 NOTE — Progress Notes (Addendum)
Recreation Therapy Notes  Date: 03/27/2022   Time: 1:20pm     Location: Craft room    Behavioral response: N/A   Intervention Topic: Decision Making    Discussion/Intervention: Patient unable due to being at ECT.   Clinical Observations/Feedback:  Patient unable due to being at ECT.    John [name] LRT/CTRS        [name] 03/27/2022 1:34 PM

## 2022-03-27 NOTE — Procedures (Signed)
ECT SERVICES Physician's Interval Evaluation & Treatment Note  Patient Identification: John [name] MRN:  [id number] Date of Evaluation:  03/27/2022 TX #: 1  MADRS:   MMSE:   P.E. Findings:  Nervous but unremarkable physical exam heart and lungs normal.  Psychiatric Interval Note:  Anxious but lucid and alert  Subjective:  Patient is a 81 y.o. male seen for evaluation for Electroconvulsive Therapy. Depressed and nervous  Treatment Summary:   '[x]'$   Right Unilateral             '[]'$  Bilateral   % Energy : 0.3 ms 90%   Impedance: 1060 ohms  Seizure Energy Index: 2982 V squared  Postictal Suppression Index: 54%  Seizure Concordance Index: 92%  Medications  Pre Shock: Brevital 90 mg labetalol 10 mg succinylcholine 100 mg  Post Shock: Versed 2 mg  Seizure Duration: 29 seconds EMG 73 seconds EEG   Comments: Continue index course  Lungs:  '[x]'$   Clear to auscultation               '[]'$  Other:   Heart:    '[x]'$   Regular rhythm             '[]'$  irregular rhythm    '[x]'$   Previous H&P reviewed, patient examined and there are NO CHANGES                 '[]'$   Previous H&P reviewed, patient examined and there are changes noted.   [name], MD 5/31/20231:51 PM

## 2022-03-27 NOTE — Plan of Care (Signed)
Patient Calm and Cooperative. Pt denies SI/HI/AV/H and contracts for safety.  Pt did endorse 5/10 Anxiety/Depression.  "Triggers are when I talk to other patients some are very enthusiastic".  Emotional support and encouragement given.  Medication given as scheduled pt denies any adverse reactions. Pt did eat hs snack and had adequate fluids. Pt reports interrupted sleep. Q 15 minute safety checks in place.  Will continue plan of care.     Problem: Education: Goal: Utilization of techniques to improve thought processes will improve Outcome: Progressing Goal: Knowledge of the prescribed therapeutic regimen will improve Outcome: Progressing   Problem: Activity: Goal: Interest or engagement in leisure activities will improve Outcome: Progressing Goal: Imbalance in normal sleep/wake cycle will improve Outcome: Progressing

## 2022-03-27 NOTE — Plan of Care (Signed)
Patient remain alert and oriented, very anxious this morning. Patient report his anxiety a 9/10, stating his anxiety is due to the pending ECT procedure. Endorsed depression of 5/10. Denies SI, HI, and AVH. Report last bowel movement to be on 03/26/22. Remain NPO. Patient is currently sitting in the day room in no apparent distress. Remain safe on the unit with Q15 minute safety checks.  Problem: Education: Goal: Utilization of techniques to improve thought processes will improve Outcome: Progressing Goal: Knowledge of the prescribed therapeutic regimen will improve Outcome: Progressing   Problem: Activity: Goal: Interest or engagement in leisure activities will improve Outcome: Progressing Goal: Imbalance in normal sleep/wake cycle will improve Outcome: Progressing   Problem: Coping: Goal: Coping ability will improve Outcome: Progressing Goal: Will verbalize feelings Outcome: Progressing   Problem: Health Behavior/Discharge Planning: Goal: Ability to make decisions will improve Outcome: Progressing Goal: Compliance with therapeutic regimen will improve Outcome: Progressing   Problem: Role Relationship: Goal: Will demonstrate positive changes in social behaviors and relationships Outcome: Progressing   Problem: Safety: Goal: Ability to disclose and discuss suicidal ideas will improve Outcome: Progressing Goal: Ability to identify and utilize support systems that promote safety will improve Outcome: Progressing   Problem: Self-Concept: Goal: Will verbalize positive feelings about self Outcome: Progressing Goal: Level of anxiety will decrease Outcome: Progressing

## 2022-03-27 NOTE — Progress Notes (Signed)
Patient return to the unit accompanied by transport via wheelchair in no apparent distress. Vitals taken BP 159/105, HR 79, O2Sat. 98% room air. MD made aware of elevated BP. Patient is currently in his room resting. Will continue to monitor.

## 2022-03-27 NOTE — Anesthesia Procedure Notes (Signed)
Date/Time: 03/27/2022 12:34 PM Performed by: Hedda Slade, CRNA Pre-anesthesia Checklist: Patient identified, Emergency Drugs available, Suction available and Patient being monitored Patient Re-evaluated:Patient Re-evaluated prior to induction Oxygen Delivery Method: Circle system utilized Preoxygenation: Pre-oxygenation with 100% oxygen Induction Type: IV induction Ventilation: Mask ventilation without difficulty and Mask ventilation throughout procedure Airway Equipment and Method: Bite block Placement Confirmation: positive ETCO2 Dental Injury: Teeth and Oropharynx as per pre-operative assessment

## 2022-03-27 NOTE — Anesthesia Preprocedure Evaluation (Signed)
Anesthesia Evaluation  Patient identified by MRN, date of birth, ID band Patient awake    Reviewed: Allergy & Precautions, H&P , NPO status , Patient's Chart, lab work & pertinent test results, reviewed documented beta blocker date and time   History of Anesthesia Complications (+) PONV and history of anesthetic complications  Airway Mallampati: II  TM Distance: >3 FB Neck ROM: full    Dental  (+) Poor Dentition, Chipped, Dental Advisory Given Permanent bridge:   Pulmonary neg shortness of breath, sleep apnea and Continuous Positive Airway Pressure Ventilation , neg COPD, neg recent URI,    Pulmonary exam normal breath sounds clear to auscultation       Cardiovascular Exercise Tolerance: Good hypertension, Pt. on medications (-) angina(-) Past MI and (-) Cardiac Stents Normal cardiovascular exam(-) dysrhythmias (-) Valvular Problems/Murmurs Rhythm:regular Rate:Normal     Neuro/Psych  Headaches, Seizures - (no longer on meds), Well Controlled,  PSYCHIATRIC DISORDERS Depression    GI/Hepatic Neg liver ROS, hiatal hernia, GERD  ,  Endo/Other  diabetes, Type 2, Oral Hypoglycemic Agents  Renal/GU negative Renal ROS  negative genitourinary   Musculoskeletal   Abdominal   Peds  Hematology negative hematology ROS (+)   Anesthesia Other Findings Past Medical History: No date: Cancer ([place]) No date: Depression No date: Depression No date: Diabetes mellitus without complication ([facility])     Comment:  type 2 08/31/2015: Dissection of carotid artery ([facility]) No date: Eczema No date: Elevated PSA No date: 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, congenital bilateral: Hearing loss No date: Hemorrhoids 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 No date: Hypertension No date: Migraine No date: Obesity No date: PONV (postoperative nausea and vomiting) 2020: Prostate cancer ([place]) No date: Rosacea No date: Sleep apnea    Reproductive/Obstetrics negative OB ROS                             Anesthesia Physical  Anesthesia Plan  ASA: 2  Anesthesia Plan: General   Post-op Pain Management:    Induction: Intravenous  PONV Risk Score and Plan: 3 and Ondansetron and TIVA  Airway Management Planned: Mask  Additional Equipment: None  Intra-op Plan:   Post-operative Plan:   Informed Consent: I have reviewed the patients History and Physical, chart, labs and discussed the procedure including the risks, benefits and alternatives for the proposed anesthesia with the patient or authorized representative who has indicated his/her understanding and acceptance.     Dental advisory given  Plan Discussed with: CRNA and Surgeon  Anesthesia Plan Comments: (Discussed risks of anesthesia with patient, including PONV, muscle aches. Rare risks discussed as well, such as cardiorespiratory sequelae, need for airway intervention and its associated risks including lip/dental/eye damage and sore throat, and allergic reactions. Discussed the role of CRNA in patient's perioperative care. Patient understands.)        Anesthesia Quick Evaluation

## 2022-03-27 NOTE — Progress Notes (Signed)
Patient left unit with transport for ECT in no apparent distress.

## 2022-03-28 ENCOUNTER — Other Ambulatory Visit: Payer: Self-pay | Admitting: Psychiatry

## 2022-03-28 LAB — GLUCOSE, CAPILLARY: Glucose-Capillary: 95 mg/dL (ref 70–99)

## 2022-03-28 MED ORDER — HYDROCHLOROTHIAZIDE 12.5 MG PO TABS
12.5000 mg | ORAL_TABLET | Freq: Every day | ORAL | Status: DC
  Administered 2022-03-28 – 2022-03-31 (×4): 12.5 mg via ORAL
  Filled 2022-03-28 (×6): qty 1

## 2022-03-28 NOTE — Progress Notes (Signed)
[facility] MD Progress Note  03/28/2022 5:13 PM John [name]  MRN:  [id number] Subjective: Follow-up for this patient with depression and anxiety.  Patient up out of bed sitting in the day room conversing pleasantly with others.  On interview he tells me at first that he felt much better after treatment and then tells me that that makes him anxious.  He talks about feeling like "my mind wants me to get better" and that somehow that is a problem.  When I pointed this out to him he agreed that actually that did sound like a good thing but somehow he is still worried about it.  Not complaining of any psychosis right now.  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   Eczema    Elevated PSA    H/O Osgood-Schlatter disease    Hand deformity, congenital    Hearing loss bilateral   Hemorrhoids    Horner syndrome    Hypertension    Migraine    Obesity    PONV (postoperative nausea and vomiting)    Prostate cancer ([place]) 2020   Rosacea    Sleep apnea     Past Surgical History:  Procedure Laterality Date   ANKLE ARTHROSCOPY Left    fracture   CHOLECYSTECTOMY  2007   CRANIOTOMY  1958   left frontal craniotomy for epidural hematoma   INSERTION OF MESH  01/02/2022   Procedure: INSERTION OF MESH;  Surgeon: Herbert Pun, MD;  Location: [facility] ORS;  Service: General;;   LIGAMENT REPAIR Right    thumb   RETINAL DETACHMENT SURGERY     TREATMENT FISTULA ANAL  2006   Family History:  Family History  Problem Relation Age of Onset   Osteoporosis Mother    Depression  Mother    Prostate cancer Father    Hypertension Father    Diabetes Brother    Kidney disease Neg Hx    Family Psychiatric  History: See previous Social History:  Social History   Substance and Sexual Activity  Alcohol Use Not Currently     Social History   Substance and Sexual Activity  Drug Use No    Social History   Socioeconomic History   Marital status: Married    Spouse name: [name]   Number of children: 2   Years of education: Not on file   Highest education level: Not on file  Occupational History   Not on file  Tobacco Use   Smoking status: Never   Smokeless tobacco: Never  Vaping Use   Vaping Use: Never used  Substance and Sexual Activity   Alcohol use: Not Currently   Drug use: No   Sexual activity: Yes    Birth control/protection: None  Other Topics Concern   Not on file  Social History Narrative   Live at home with wife.    Social Determinants of Health   Financial Resource Strain: Not on file  Food Insecurity: Not on file  Transportation Needs: Not on file  Physical Activity: Not on file  Stress: Not on file  Social Connections: Not on file   Additional Social History:                         Sleep: Fair  Appetite:  Fair  Current Medications: Current Facility-Administered Medications  Medication Dose Route Frequency Provider Last Rate Last Admin   acetaminophen (TYLENOL) tablet 650 mg  650 mg Oral Q6H PRN Sherlon Handing, NP       alum & mag hydroxide-simeth (MAALOX/MYLANTA) 200-200-20 MG/5ML suspension 30 mL  30 mL Oral Q4H PRN Sherlon Handing, NP       hydrochlorothiazide (HYDRODIURIL) tablet 12.5 mg  12.5 mg Oral Daily Daymond Cordts T, MD       LORazepam (ATIVAN) tablet 0.5 mg  0.5 mg Oral Q4H PRN Parks Ranger, DO   0.5 mg at 03/27/22 2139   losartan (COZAAR) tablet 50 mg  50 mg Oral Daily Parks Ranger, DO   50 mg at 03/28/22 0934   magnesium hydroxide (MILK OF MAGNESIA) suspension 30 mL  30 mL  Oral Daily PRN Waldon Merl F, NP   30 mL at 03/24/22 2017   metoprolol succinate (TOPROL-XL) 24 hr tablet 50 mg  50 mg Oral QPC breakfast Parks Ranger, DO   50 mg at 03/28/22 0934   tamsulosin (FLOMAX) capsule 0.4 mg  0.4 mg Oral Q2200 Caroline Sauger, NP   0.4 mg at 03/27/22 2139   traZODone (DESYREL) tablet 50 mg  50 mg Oral QHS PRN Parks Ranger, DO   50 mg at 03/27/22 2139   venlafaxine XR (EFFEXOR-XR) 24 hr capsule 300 mg  300 mg Oral QPC breakfast Parks Ranger, DO   300 mg at 03/28/22 2831    Lab Results:  Results for orders placed or performed during the hospital encounter of 03/13/22 (from the past 48 hour(s))  Glucose, capillary     Status: None   Collection Time: 03/28/22 10:44 AM  Result Value Ref Range   Glucose-Capillary 95 70 - 99 mg/dL    Comment: Glucose reference range applies only to samples taken after fasting for at least 8 hours.    Blood Alcohol level:  Lab Results  Component Value Date   ETH <10 51/76/1607    Metabolic Disorder Labs: Lab Results  Component Value Date   HGBA1C 5.6 03/19/2022   MPG 114.02 03/19/2022   No results found for: PROLACTIN Lab Results  Component Value Date   CHOL 131 03/19/2022   TRIG 92 03/19/2022   HDL 33 (L) 03/19/2022   CHOLHDL 4.0 03/19/2022   VLDL 18 03/19/2022   LDLCALC 80 03/19/2022    Physical Findings: AIMS:  , ,  ,  ,    CIWA:    COWS:     Musculoskeletal: Strength & Muscle Tone: within normal limits Gait & Station: normal Patient leans: N/A  Psychiatric Specialty Exam:  Presentation  General Appearance: Appropriate for Environment  Eye Contact:Good  Speech:Clear and Coherent  Speech Volume:Normal  Handedness:No data recorded  Mood and Affect  Mood:Depressed  Affect:Congruent   Thought Process  Thought Processes:Coherent  Descriptions of Associations:Intact  Orientation:Full (Time, Place and Person)  Thought Content:Logical  History of  Schizophrenia/Schizoaffective disorder:No  Duration of Psychotic Symptoms:No data recorded Hallucinations:No data recorded Ideas of Reference:None  Suicidal Thoughts:No data recorded Homicidal Thoughts:No data recorded  Sensorium  Memory:Immediate Good; Recent Good; Remote Good  Judgment:No data recorded Insight:Good   Executive Functions  Concentration:Good  Attention Span:Good  Recall:Good  Fund of Knowledge:Good  Language:Good   Psychomotor Activity  Psychomotor Activity:No data recorded  Assets  Assets:Desire for Improvement; Communication Skills; Financial Resources/Insurance; Housing; Intimacy; Social Support; Resilience; Physical Health   Sleep  Sleep:No data recorded   Physical Exam: Physical Exam Vitals and nursing note reviewed.  Constitutional:      Appearance: Normal appearance.  HENT:     Head: Normocephalic and atraumatic.     Mouth/Throat:     Pharynx: Oropharynx is clear.  Eyes:     Pupils: Pupils are equal, round, and reactive to light.  Cardiovascular:     Rate and Rhythm: Normal rate and regular rhythm.  Pulmonary:     Effort: Pulmonary effort is normal.     Breath sounds: Normal breath sounds.  Abdominal:     General: Abdomen is flat.     Palpations: Abdomen is soft.  Musculoskeletal:        General: Normal range of motion.  Skin:    General: Skin is warm and dry.  Neurological:     General: No focal deficit present.     Mental Status: He is alert. Mental status is at baseline.  Psychiatric:        Attention and Perception: Attention normal.        Mood and Affect: Mood is anxious.        Speech: Speech normal.        Behavior: Behavior is cooperative.        Thought Content: Thought content normal.        Cognition and Memory: Cognition normal.        Judgment: Judgment normal.   Review of Systems  Constitutional: Negative.   HENT: Negative.    Eyes: Negative.   Respiratory: Negative.    Cardiovascular: Negative.    Gastrointestinal: Negative.   Musculoskeletal: Negative.   Skin: Negative.   Neurological: Negative.   Psychiatric/Behavioral:  Positive for depression. Negative for hallucinations, substance abuse and suicidal ideas. The patient is nervous/anxious.   Blood pressure (!) 160/99, pulse 75, temperature 98.1 F (36.7 C), temperature source Oral, resp. rate 20, height '6\' 2"'$  (1.88 m), SpO2 99 %. Body mass index is 28.3 kg/m.   Treatment Plan Summary: Medication management and Plan continue current medicine.  ECT treatment.  I am optimistic about the chances for improvement.  Next ECT will be tomorrow.  [name], MD 03/28/2022, 5:13 PM

## 2022-03-28 NOTE — Plan of Care (Signed)
Patient remain alert and oriented, calm and cooperative during assessment. Denies SI, HI, and AVH. Endorsed anxiety of 6/10 patient stated that he's feeling better post ECT then went on to say "I don't know it's the high and then the low". Endorsed depression of 4/10. Eat meals in the day room among peers with good appetite. Ambulate from day room to room in no apparent distress at this time. Compliant with all due medications. Q15 minute safety checks continue. Patient remain safe on the unit.   Problem: Education: Goal: Utilization of techniques to improve thought processes will improve Outcome: Progressing Goal: Knowledge of the prescribed therapeutic regimen will improve Outcome: Progressing   Problem: Activity: Goal: Interest or engagement in leisure activities will improve Outcome: Progressing Goal: Imbalance in normal sleep/wake cycle will improve Outcome: Progressing   Problem: Coping: Goal: Coping ability will improve Outcome: Progressing Goal: Will verbalize feelings Outcome: Progressing   Problem: Health Behavior/Discharge Planning: Goal: Ability to make decisions will improve Outcome: Progressing Goal: Compliance with therapeutic regimen will improve Outcome: Progressing   Problem: Role Relationship: Goal: Will demonstrate positive changes in social behaviors and relationships Outcome: Progressing   Problem: Safety: Goal: Ability to disclose and discuss suicidal ideas will improve Outcome: Progressing Goal: Ability to identify and utilize support systems that promote safety will improve Outcome: Progressing   Problem: Self-Concept: Goal: Will verbalize positive feelings about self Outcome: Progressing Goal: Level of anxiety will decrease Outcome: Progressing

## 2022-03-28 NOTE — Progress Notes (Signed)
Patient is currently in his room , vitals recheck BP went down to 150/96 after the first dose of the hydrochlorothiazide. No further complain of dizziness. Remain safe on the unit with Q 15 minute safety check.

## 2022-03-28 NOTE — Progress Notes (Signed)
Recreation Therapy Notes  Date: 03/28/2022   Time: 1:15pm    Location: Courtyard     Behavioral response: Appropriate   Intervention Topic:  Social Skills     Discussion/Intervention:  Group content on today was focused on social skills. The group defined social skills and identified ways they use social skills. Patients expressed what obstacles they face when trying to be social. Participants described the importance of social skills. The group listed ways to improve social skills and reasons to improve social skills. Individuals had an opportunity to learn new and improve social skills as well as identify their weaknesses. Clinical Observations/Feedback: Patient came to group and was able to be appropriate with staff and peers. Individual was social with peers and staff while participating in the intervention.    John [name] LRT/CTRS           [name] 03/28/2022 2:06 PM

## 2022-03-28 NOTE — Progress Notes (Signed)
Patient complain of dizziness vitals taken BP 160/99 HR 75. Blood pressure continue to be elevated despite receiving scheduled BP medications. MD made aware patient started on Hydrochlorothiazide 12.5 mg daily. Patient in the day room among peers eating supper in no apparent distress at this time. Will continue to monitor patient status and take appropriate action.

## 2022-03-28 NOTE — Group Note (Signed)
[facility] LCSW Group Therapy Note   Group Date: 03/28/2022 Start Time: 1400 End Time: 1430   Type of Therapy/Topic:  Group Therapy:  Emotion Regulation  Participation Level:  Active   Mood:  Description of Group:    The purpose of this group is to assist patients in learning to regulate negative emotions and experience positive emotions. Patients will be guided to discuss ways in which they have been vulnerable to their negative emotions. These vulnerabilities will be juxtaposed with experiences of positive emotions or situations, and patients challenged to use positive emotions to combat negative ones. Special emphasis will be placed on coping with negative emotions in conflict situations, and patients will process healthy conflict resolution skills.  Therapeutic Goals: Patient will identify two positive emotions or experiences to reflect on in order to balance out negative emotions:  Patient will label two or more emotions that they find the most difficult to experience:  Patient will be able to demonstrate positive conflict resolution skills through discussion or role plays:   Summary of Patient Progress:  Patient was present for the entirety of the group session. Patient was an active listener and participated in the topic of discussion, provided helpful advice to others, and added nuance to topic of conversation. He stated that he feels "so so" currently after ECT. He said he does not know how he usually regulates his emotions but stated he does not really have coping strategies. He said that in the past he used to ride his bike but can no longer do that as an activity. CSW encouraged pt to think of other physical activities that he might find useful going forward.      Therapeutic Modalities:   Cognitive Behavioral Therapy Feelings Identification Dialectical Behavioral Therapy   Tarica Harl A [place], LCSWA

## 2022-03-28 NOTE — BH Assessment (Incomplete)
Patient is alert and oriented x3, denying SI/HI, A/V hallucinations. He was noted several times standing in his room, and at times pacing. He does respond to conversation but was not view initiating conversation with any of his peers.He is visually anxious. He was encouraged to take a PRN dose of ativan after he refused 30 min prior when it was noted that he was pacing in his rrom. Patient was noted last night not sleeping he was also offered his PRN Trazodone to aid him in sleep. A recheck of his BP is slowly coming down and a re-check will be perform in and hour to see if the anti-anxiety medication aid in lowering BP.  Patient was noted to be asleep form 0100 until 0500 this is  an improvement from the previous night shift. Will pass on to the on-coming shift that sleep medication worked well for the patient.   0700 Patient was made NPO at 2245 and  has remained NPO for his ECT procedure this AM at 0800.

## 2022-03-29 ENCOUNTER — Inpatient Hospital Stay: Payer: Medicare Other | Admitting: Anesthesiology

## 2022-03-29 ENCOUNTER — Ambulatory Visit: Payer: Medicare Other | Attending: Psychiatry

## 2022-03-29 ENCOUNTER — Encounter: Payer: Self-pay | Admitting: Psychiatry

## 2022-03-29 LAB — GLUCOSE, CAPILLARY
Glucose-Capillary: 129 mg/dL — ABNORMAL HIGH (ref 70–99)
Glucose-Capillary: 118 mg/dL — ABNORMAL HIGH (ref 70–99)

## 2022-03-29 MED ORDER — KETOROLAC TROMETHAMINE 30 MG/ML IJ SOLN
INTRAMUSCULAR | Status: AC
  Administered 2022-03-29: 30 mg via INTRAVENOUS
  Filled 2022-03-29: qty 1

## 2022-03-29 MED ORDER — GLYCOPYRROLATE 0.2 MG/ML IJ SOLN
INTRAMUSCULAR | Status: AC
  Administered 2022-03-29: 0.1 mg via INTRAVENOUS
  Filled 2022-03-29: qty 1

## 2022-03-29 MED ORDER — LABETALOL HCL 5 MG/ML IV SOLN
INTRAVENOUS | Status: DC | PRN
  Administered 2022-03-29: 10 mg via INTRAVENOUS

## 2022-03-29 MED ORDER — SODIUM CHLORIDE 0.9 % IV SOLN: 500.0000 mL | Freq: Once | INTRAVENOUS | Status: AC

## 2022-03-29 MED ORDER — KETOROLAC TROMETHAMINE 30 MG/ML IJ SOLN: 30.0000 mg | Freq: Once | INTRAMUSCULAR | Status: AC

## 2022-03-29 MED ORDER — MIDAZOLAM HCL 2 MG/2ML IJ SOLN
INTRAMUSCULAR | Status: DC | PRN
  Administered 2022-03-29: 2 mg via INTRAVENOUS

## 2022-03-29 MED ORDER — SUCCINYLCHOLINE CHLORIDE 200 MG/10ML IV SOSY
PREFILLED_SYRINGE | INTRAVENOUS | Status: DC | PRN
  Administered 2022-03-29: 100 mg via INTRAVENOUS

## 2022-03-29 MED ORDER — SUCCINYLCHOLINE CHLORIDE 200 MG/10ML IV SOSY
PREFILLED_SYRINGE | INTRAVENOUS | Status: AC
  Filled 2022-03-29: qty 10

## 2022-03-29 MED ORDER — METHOHEXITAL SODIUM 0.5 G IJ SOLR
INTRAMUSCULAR | Status: AC
  Filled 2022-03-29: qty 500

## 2022-03-29 MED ORDER — METHOHEXITAL SODIUM 100 MG/10ML IV SOSY
PREFILLED_SYRINGE | INTRAVENOUS | Status: DC | PRN
  Administered 2022-03-29: 90 mg via INTRAVENOUS

## 2022-03-29 MED ORDER — GLYCOPYRROLATE 0.2 MG/ML IJ SOLN
0.1000 mg | Freq: Once | INTRAMUSCULAR | Status: AC
  Filled 2022-03-29: qty 0.5

## 2022-03-29 MED ORDER — MIDAZOLAM HCL 2 MG/2ML IJ SOLN
INTRAMUSCULAR | Status: AC
  Filled 2022-03-29: qty 2

## 2022-03-29 MED ORDER — MIDAZOLAM HCL 2 MG/2ML IJ SOLN: 2.0000 mg | Freq: Once | INTRAMUSCULAR | Status: DC

## 2022-03-29 NOTE — Anesthesia Preprocedure Evaluation (Addendum)
Anesthesia Evaluation  Patient identified by MRN, date of birth, ID band Patient awake    Reviewed: Allergy & Precautions, H&P , NPO status , Patient's Chart, lab work & pertinent test results, reviewed documented beta blocker date and time   History of Anesthesia Complications (+) PONV and history of anesthetic complications  Airway Mallampati: II  TM Distance: >3 FB Neck ROM: full    Dental  (+) Poor Dentition, Dental Advisory Given Permanent bridge:   Pulmonary neg shortness of breath, sleep apnea and Continuous Positive Airway Pressure Ventilation , neg COPD, neg recent URI,    Pulmonary exam normal breath sounds clear to auscultation       Cardiovascular Exercise Tolerance: Good hypertension, Pt. on medications and Pt. on home beta blockers (-) angina(-) Past MI and (-) Cardiac Stents Normal cardiovascular exam(-) dysrhythmias (-) Valvular Problems/Murmurs Rhythm:regular Rate:Normal     Neuro/Psych  Headaches, Seizures - (no longer on meds), Well Controlled,  PSYCHIATRIC DISORDERS Depression    GI/Hepatic Neg liver ROS, hiatal hernia, GERD  ,  Endo/Other  diabetes, Type 2, Oral Hypoglycemic Agents  Renal/GU negative Renal ROS  negative genitourinary   Musculoskeletal   Abdominal Normal abdominal exam  (+)   Peds  Hematology negative hematology ROS (+)   Anesthesia Other Findings Past Medical History: No date: Cancer ([place]) No date: Depression No date: Depression No date: Diabetes mellitus without complication (HCC)     Comment:  type 2 08/31/2015: Dissection of carotid artery (HCC) No date: Eczema No date: Elevated PSA No date: 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, congenital bilateral: Hearing loss No date: Hemorrhoids 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 No date: Hypertension No date: Migraine No date: Obesity No date: PONV (postoperative nausea and vomiting) 2020: Prostate cancer ([place]) No date:  Rosacea No date: Sleep apnea   Reproductive/Obstetrics negative OB ROS                            Anesthesia Physical  Anesthesia Plan  ASA: 2  Anesthesia Plan: General   Post-op Pain Management:    Induction: Intravenous  PONV Risk Score and Plan: 3 and Ondansetron and TIVA  Airway Management Planned: Mask  Additional Equipment: None  Intra-op Plan:   Post-operative Plan:   Informed Consent: I have reviewed the patients History and Physical, chart, labs and discussed the procedure including the risks, benefits and alternatives for the proposed anesthesia with the patient or authorized representative who has indicated his/her understanding and acceptance.     Dental advisory given  Plan Discussed with: CRNA and Surgeon  Anesthesia Plan Comments: (Discussed risks of anesthesia with patient, including PONV, muscle aches. Rare risks discussed as well, such as cardiorespiratory sequelae, need for airway intervention and its associated risks including lip/dental/eye damage and sore throat, and allergic reactions. Discussed the role of CRNA in patient's perioperative care. Patient understands.)       Anesthesia Quick Evaluation

## 2022-03-29 NOTE — Progress Notes (Signed)
Recreation Therapy Notes  Date: 03/29/2022  Time: 2:10 pm    Location: Dayroom   Behavioral response: Appropriate  Intervention Topic:  Stress Management    Discussion/Intervention:  Group content on today was focused on stress. The group defined stress and way to cope with stress. Participants expressed how they know when they are stresses out. Individuals described the different ways they have to cope with stress. The group stated reasons why it is important to cope with stress. Patient explained what good stress is and some examples. The group participated in the intervention "Stress Management". Individuals were separated into two group and answered questions related to stress.  Clinical Observations/Feedback: Patient came to group and identified singing, listening to music and build electronic as how he manages his stress. Individual was social with peers and staff while participating in the intervention.  John [name] LRT/CTRS         Amarea Macdowell 03/29/2022 2:58 PM

## 2022-03-29 NOTE — Progress Notes (Signed)
Assumed care of Pt from 0730. He is visible in the milieu, was maintained NPO for ECT as per report. He dened SI/HI/AVH or self harm thoughts/intent but continues to endorse ongoing anxiety & depression. He was observed ambulating with a steady gait with no falls or unsafe behavior noted thus far. Vet is currently off unit for ECT.

## 2022-03-29 NOTE — Progress Notes (Signed)
Pt back from ECT accompanied by transport via W/C. He is A&Ox3, in no apparent distress/discomfort. Pt complied with his vitals and morning meds. BP remains elevated and he was medicated with his morning meds as per order. Pt observed ambulating with a steady gait with no falls or unsafe behavior noted thus far. Pt is currently in the dayroom awaiting his lunch tray. Q15 min observations maintained for safety and support provided as needed.

## 2022-03-29 NOTE — Progress Notes (Signed)
[facility] MD Progress Note  03/29/2022 6:37 PM John [name]  MRN:  [id number] Subjective: Follow-up 81 year old man with depression and anxiety.  Had his second ECT treatment today.  Treatment went fine without complications.  Patient is complaining of a pain specifically located to the right occipital region.  He seems to associate this with high blood pressure.  I assured him that was not likely to be a symptom of high blood pressure.  He is still pretty anxious but his behavior seems to have gotten a little more relaxed.  No active suicidal thought. Principal Problem: MDD (major depressive disorder) Diagnosis: Principal Problem:   MDD (major depressive disorder)  Total Time spent with patient: 30 minutes  Past Psychiatric History: Past history of recurrent depression  Past Medical History:  Past Medical History:  Diagnosis Date   Cancer ([place])    Depression    Depression    Diabetes mellitus without complication ([place])    type 2   Dissection of carotid artery ([place]) 08/31/2015   Eczema    Elevated PSA    H/O Osgood-Schlatter disease    Hand deformity, congenital    Hearing loss bilateral   Hemorrhoids    Horner syndrome    Hypertension    Migraine    Obesity    PONV (postoperative nausea and vomiting)    Prostate cancer ([place]) 2020   Rosacea    Sleep apnea     Past Surgical History:  Procedure Laterality Date   ANKLE ARTHROSCOPY Left    fracture   CHOLECYSTECTOMY  2007   CRANIOTOMY  1958   left frontal craniotomy for epidural hematoma   INSERTION OF MESH  01/02/2022   Procedure: INSERTION OF MESH;  Surgeon: Herbert Pun, MD;  Location: [facility] ORS;  Service: General;;   LIGAMENT REPAIR Right    thumb   RETINAL DETACHMENT SURGERY     TREATMENT FISTULA ANAL  2006   Family History:  Family History  Problem Relation Age of Onset   Osteoporosis Mother    Depression Mother    Prostate cancer Father    Hypertension Father    Diabetes Brother    Kidney disease Neg Hx     Family Psychiatric  History: See previous Social History:  Social History   Substance and Sexual Activity  Alcohol Use Not Currently     Social History   Substance and Sexual Activity  Drug Use No    Social History   Socioeconomic History   Marital status: Married    Spouse name: [name]   Number of children: 2   Years of education: Not on file   Highest education level: Not on file  Occupational History   Not on file  Tobacco Use   Smoking status: Never   Smokeless tobacco: Never  Vaping Use   Vaping Use: Never used  Substance and Sexual Activity   Alcohol use: Not Currently   Drug use: No   Sexual activity: Yes    Birth control/protection: None  Other Topics Concern   Not on file  Social History Narrative   Live at home with wife.    Social Determinants of Health   Financial Resource Strain: Not on file  Food Insecurity: Not on file  Transportation Needs: Not on file  Physical Activity: Not on file  Stress: Not on file  Social Connections: Not on file   Additional Social History:  Sleep: Fair  Appetite:  Negative  Current Medications: Current Facility-Administered Medications  Medication Dose Route Frequency Provider Last Rate Last Admin   acetaminophen (TYLENOL) tablet 650 mg  650 mg Oral Q6H PRN Sherlon Handing, NP       alum & mag hydroxide-simeth (MAALOX/MYLANTA) 200-200-20 MG/5ML suspension 30 mL  30 mL Oral Q4H PRN Waldon Merl F, NP       hydrochlorothiazide (HYDRODIURIL) tablet 12.5 mg  12.5 mg Oral Daily Nailah Luepke T, MD   12.5 mg at 03/29/22 1342   LORazepam (ATIVAN) tablet 0.5 mg  0.5 mg Oral Q4H PRN Parks Ranger, DO   0.5 mg at 03/28/22 2239   losartan (COZAAR) tablet 50 mg  50 mg Oral Daily Parks Ranger, DO   50 mg at 03/29/22 1343   magnesium hydroxide (MILK OF MAGNESIA) suspension 30 mL  30 mL Oral Daily PRN Waldon Merl F, NP   30 mL at 03/24/22 2017   metoprolol  succinate (TOPROL-XL) 24 hr tablet 50 mg  50 mg Oral QPC breakfast Parks Ranger, DO   50 mg at 03/29/22 1344   midazolam (VERSED) injection 2 mg  2 mg Intravenous Once Brandolyn Shortridge T, MD       tamsulosin [facility]) capsule 0.4 mg  0.4 mg Oral Q2200 Caroline Sauger, NP   0.4 mg at 03/28/22 2142   traZODone (DESYREL) tablet 50 mg  50 mg Oral QHS PRN Parks Ranger, DO   50 mg at 03/28/22 2239   venlafaxine XR (EFFEXOR-XR) 24 hr capsule 300 mg  300 mg Oral QPC breakfast Parks Ranger, DO   300 mg at 03/29/22 1343    Lab Results:  Results for orders placed or performed during the hospital encounter of 03/13/22 (from the past 48 hour(s))  Glucose, capillary     Status: None   Collection Time: 03/28/22 10:44 AM  Result Value Ref Range   Glucose-Capillary 95 70 - 99 mg/dL    Comment: Glucose reference range applies only to samples taken after fasting for at least 8 hours.  Glucose, capillary     Status: Abnormal   Collection Time: 03/29/22  7:54 AM  Result Value Ref Range   Glucose-Capillary 129 (H) 70 - 99 mg/dL    Comment: Glucose reference range applies only to samples taken after fasting for at least 8 hours.  Glucose, capillary     Status: Abnormal   Collection Time: 03/29/22 12:42 PM  Result Value Ref Range   Glucose-Capillary 118 (H) 70 - 99 mg/dL    Comment: Glucose reference range applies only to samples taken after fasting for at least 8 hours.    Blood Alcohol level:  Lab Results  Component Value Date   ETH <10 44/81/8563    Metabolic Disorder Labs: Lab Results  Component Value Date   HGBA1C 5.6 03/19/2022   MPG 114.02 03/19/2022   No results found for: PROLACTIN Lab Results  Component Value Date   CHOL 131 03/19/2022   TRIG 92 03/19/2022   HDL 33 (L) 03/19/2022   CHOLHDL 4.0 03/19/2022   VLDL 18 03/19/2022   LDLCALC 80 03/19/2022    Physical Findings: AIMS:  , ,  ,  ,    CIWA:    COWS:     Musculoskeletal: Strength & Muscle  Tone: within normal limits Gait & Station: normal Patient leans: N/A  Psychiatric Specialty Exam:  Presentation  General Appearance: Appropriate for Environment  Eye Contact:Good  Speech:Clear and  Coherent  Speech Volume:Normal  Handedness:No data recorded  Mood and Affect  Mood:Depressed  Affect:Congruent   Thought Process  Thought Processes:Coherent  Descriptions of Associations:Intact  Orientation:Full (Time, Place and Person)  Thought Content:Logical  History of Schizophrenia/Schizoaffective disorder:No  Duration of Psychotic Symptoms:No data recorded Hallucinations:No data recorded Ideas of Reference:None  Suicidal Thoughts:No data recorded Homicidal Thoughts:No data recorded  Sensorium  Memory:Immediate Good; Recent Good; Remote Good  Judgment:No data recorded Insight:Good   Executive Functions  Concentration:Good  Attention Span:Good  [place]  Language:Good   Psychomotor Activity  Psychomotor Activity:No data recorded  Assets  Assets:Desire for Improvement; Armed forces logistics/support/administrative officer; Financial Resources/Insurance; Housing; Intimacy; Social Support; Resilience; Physical Health   Sleep  Sleep:No data recorded   Physical Exam: Physical Exam Vitals and nursing note reviewed.  Constitutional:      Appearance: Normal appearance.  HENT:     Head: Normocephalic and atraumatic.     Mouth/Throat:     Pharynx: Oropharynx is clear.  Eyes:     Pupils: Pupils are equal, round, and reactive to light.  Cardiovascular:     Rate and Rhythm: Normal rate and regular rhythm.  Pulmonary:     Effort: Pulmonary effort is normal.     Breath sounds: Normal breath sounds.  Abdominal:     General: Abdomen is flat.     Palpations: Abdomen is soft.  Musculoskeletal:        General: Normal range of motion.  Skin:    General: Skin is warm and dry.  Neurological:     General: No focal deficit present.     Mental Status: He  is alert. Mental status is at baseline.  Psychiatric:        Attention and Perception: Attention normal.        Mood and Affect: Mood is anxious.        Speech: Speech is delayed.        Behavior: Behavior is cooperative.        Thought Content: Thought content normal.        Cognition and Memory: Cognition normal.   Review of Systems  Constitutional: Negative.   HENT: Negative.    Eyes: Negative.   Respiratory: Negative.    Cardiovascular: Negative.   Gastrointestinal: Negative.   Musculoskeletal: Negative.   Skin: Negative.   Neurological:  Positive for headaches.  Psychiatric/Behavioral:  The patient is nervous/anxious.   Blood pressure (!) 162/98, pulse 72, temperature 98.4 F (36.9 C), temperature source Oral, resp. rate 18, height '6\' 2"'$  (1.88 m), weight 100 kg, SpO2 96 %. Body mass index is 28.31 kg/m.   Treatment Plan Summary: Plan ECT again on Monday.  No change to medication treatment for now  [name], MD 03/29/2022, 6:37 PM

## 2022-03-29 NOTE — H&P (Signed)
John [name] is an 81 y.o. male.   Chief Complaint: Patient seen.  Continues to feel very depressed and anxious.  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   Eczema    Elevated PSA    H/O Osgood-Schlatter disease    Hand deformity, congenital    Hearing loss bilateral   Hemorrhoids    Horner syndrome    Hypertension    Migraine    Obesity    PONV (postoperative nausea and vomiting)    Prostate cancer ([place]) 2020   Rosacea    Sleep apnea     Past Surgical History:  Procedure Laterality Date   ANKLE ARTHROSCOPY Left    fracture   CHOLECYSTECTOMY  2007   CRANIOTOMY  1958   left frontal craniotomy for epidural hematoma   INSERTION OF MESH  01/02/2022   Procedure: INSERTION OF MESH;  Surgeon: Herbert Pun, MD;  Location: [facility] ORS;  Service: General;;   LIGAMENT REPAIR Right    thumb   RETINAL DETACHMENT SURGERY     TREATMENT FISTULA ANAL  2006    Family History  Problem Relation Age of Onset   Osteoporosis Mother    Depression Mother    Prostate cancer Father    Hypertension Father    Diabetes Brother    Kidney disease Neg Hx    Social History:  reports that he has never smoked. He has never used smokeless tobacco. He reports that he does not currently use alcohol. He reports that he does not use drugs.  Allergies: No Known Allergies  Facility-Administered Medications Prior to Admission  Medication Dose Route Frequency Provider Last Rate Last Admin   gentamicin (GARAMYCIN) injection 80 mg  80 mg Intramuscular Once Hollice Espy, MD       levofloxacin [facility]) tablet 500 mg  500 mg Oral Once Hollice Espy, MD       Medications Prior to Admission  Medication Sig Dispense Refill   aspirin EC 81 MG tablet Take 81 mg by mouth daily.      atenolol (TENORMIN) 50 MG tablet Take 50 mg by mouth daily.  0   buPROPion ER (WELLBUTRIN SR) 100 MG 12 hr tablet Take 100 mg by mouth 2 (two) times daily.     glipiZIDE (GLUCOTROL) 5 MG tablet Take 10 mg by mouth 2 (two) times daily before a meal.     oxyCODONE-acetaminophen (PERCOCET) 5-325 MG tablet Take 1 tablet by mouth every 4 (four) hours as needed for severe pain. (Patient not taking: Reported on 03/12/2022) 20 tablet 0   SUMAtriptan (IMITREX) 100 MG tablet TAKE 1 TABLET BY MOUTH ONCE AS NEEDED FOR MIGRAINE. MAY REPEAT 2ND DOSE AFTER 2 HOUR IF NEEDED     tamsulosin (FLOMAX) 0.4 MG CAPS capsule TAKE 1 CAPSULE (0.4 MG TOTAL) BY MOUTH DAILY AFTER SUPPER. 90 capsule 3   venlafaxine (EFFEXOR) 37.5 MG tablet Take 75 mg by mouth daily.      Results for orders placed or performed during the hospital encounter of 03/13/22 (from the past 48 hour(s))  Glucose, capillary     Status: None   Collection Time: 03/28/22 10:44 AM  Result Value Ref Range   Glucose-Capillary 95 70 - 99 mg/dL    Comment: Glucose  reference range applies only to samples taken after fasting for at least 8 hours.  Glucose, capillary     Status: Abnormal   Collection Time: 03/29/22  7:54 AM  Result Value Ref Range   Glucose-Capillary 129 (H) 70 - 99 mg/dL    Comment: Glucose reference range applies only to samples taken after fasting for at least 8 hours.  Glucose, capillary     Status: Abnormal   Collection Time: 03/29/22 12:42 PM  Result Value Ref Range   Glucose-Capillary 118 (H) 70 - 99 mg/dL    Comment: Glucose reference range applies only to samples taken after fasting for at least 8 hours.   No results found.  Review of Systems  Constitutional: Negative.   HENT: Negative.    Eyes: Negative.   Respiratory: Negative.    Cardiovascular: Negative.   Gastrointestinal: Negative.   Musculoskeletal: Negative.   Skin: Negative.   Neurological: Negative.   Psychiatric/Behavioral:  Positive for dysphoric mood. The  patient is nervous/anxious.    Blood pressure (!) 162/98, pulse 72, temperature 98.4 F (36.9 C), temperature source Oral, resp. rate 18, height '6\' 2"'$  (1.88 m), weight 100 kg, SpO2 96 %. Physical Exam Vitals and nursing note reviewed.  Constitutional:      Appearance: He is well-developed.  HENT:     Head: Normocephalic and atraumatic.  Eyes:     Conjunctiva/sclera: Conjunctivae normal.     Pupils: Pupils are equal, round, and reactive to light.  Cardiovascular:     Heart sounds: Normal heart sounds.  Pulmonary:     Effort: Pulmonary effort is normal.  Abdominal:     Palpations: Abdomen is soft.  Musculoskeletal:        General: Normal range of motion.     Cervical back: Normal range of motion.  Skin:    General: Skin is warm and dry.  Neurological:     General: No focal deficit present.     Mental Status: He is alert.  Psychiatric:        Attention and Perception: Attention normal.        Mood and Affect: Mood is anxious and depressed.        Speech: Speech normal.        Behavior: Behavior is withdrawn.        Cognition and Memory: Memory is impaired.     Assessment/Plan ECT treatment #2 today.  No change to medication  [name], MD 03/29/2022, 7:14 PM

## 2022-03-29 NOTE — Procedures (Signed)
ECT SERVICES Physician's Interval Evaluation & Treatment Note  Patient Identification: John [name] MRN:  [id number] Date of Evaluation:  03/29/2022 TX #: 2  MADRS:   MMSE:   P.E. Findings:  No change to physical exam  Psychiatric Interval Note:  Continued anxiety and depression  Subjective:  Patient is a 81 y.o. male seen for evaluation for Electroconvulsive Therapy. Mostly anxious  Treatment Summary:   '[x]'$   Right Unilateral             '[]'$  Bilateral   % Energy : 0.3 ms 90%   Impedance: 740 ohms  Seizure Energy Index: 1569 V squared  Postictal Suppression Index: 22%  Seizure Concordance Index: 95%  Medications  Pre Shock: Robinul 0.1 mg Toradol 30 mg labetalol 10 mg Brevital 90 mg succinylcholine 100 mg  Post Shock: Versed 2 mg  Seizure Duration: EMG 33 seconds EEG 58 seconds   Comments: Continue treatment next week  Lungs:  '[x]'$   Clear to auscultation               '[]'$  Other:   Heart:    '[x]'$   Regular rhythm             '[]'$  irregular rhythm    '[x]'$   Previous H&P reviewed, patient examined and there are NO CHANGES                 '[]'$   Previous H&P reviewed, patient examined and there are changes noted.   [name], MD 6/2/20237:16 PM

## 2022-03-29 NOTE — Transfer of Care (Signed)
Immediate Anesthesia Transfer of Care Note  Patient: John [name]  Procedure(s) Performed: ECT TX  Patient Location: PACU  Anesthesia Type:General  Level of Consciousness: drowsy and patient cooperative  Airway & Oxygen Therapy: Patient Spontanous Breathing  Post-op Assessment: Report given to RN and Post -op Vital signs reviewed and stable  Post vital signs: Reviewed and stable  Last Vitals:  Vitals Value Taken Time  BP 163/99 03/29/22 1240  Temp 36.2 C 03/29/22 1236  Pulse 70 03/29/22 1241  Resp 21 03/29/22 1241  SpO2 96 % 03/29/22 1241  Vitals shown include unvalidated device data.  Last Pain:  Vitals:   03/29/22 1236  TempSrc:   PainSc: Asleep         Complications: No notable events documented.

## 2022-03-29 NOTE — Plan of Care (Signed)
  Problem: Activity: Goal: Interest or engagement in leisure activities will improve Outcome: Progressing Goal: Imbalance in normal sleep/wake cycle will improve Outcome: Progressing   Problem: Safety: Goal: Ability to disclose and discuss suicidal ideas will improve Outcome: Progressing   Problem: Self-Concept: Goal: Will verbalize positive feelings about self Outcome: Progressing Goal: Level of anxiety will decrease Outcome: Progressing

## 2022-03-29 NOTE — Progress Notes (Signed)
   03/29/22 2015  Psych Admission Type (Psych Patients Only)  Admission Status Voluntary  Psychosocial Assessment  Patient Complaints Anxiety;Depression;Worrying  Eye Contact Fair  Facial Expression Anxious  Affect Flat  Speech Logical/coherent  Interaction Assertive  Motor Activity Slow  Appearance/Hygiene In scrubs  Behavior Characteristics Cooperative;Anxious  Mood Anxious;Depressed  Thought Process  Coherency WDL  Content WDL  Delusions None reported or observed  Perception WDL  Hallucination None reported or observed  Judgment WDL  Confusion None  Danger to Self  Current suicidal ideation? Denies  Danger to Others  Danger to Others None reported or observed   Pt seen in dayroom. Pt denies SI, HI, AVH. Says he feels intermittent discomfort in his right occipital area 3-4/10. Pt rates anxiety 7/10 and depression 6/10. "I thought it would be different today." Pt had his second ECT treatment today. Pt said he felt a lift in his mood after the first treatment but didn't feel any change in his anxiety today. Pt expressed some loneliness here. Encouraged him to ask his wife to visit. Pt also expressed concern because he was having trouble finding his words today. "The doctor said it should go away by the end of the weekend." Pt asked about coping skills for his anxiety. "Sometimes I snap my fingers to distract me or I meditate." Pt said he has tried to do some of that here but not enough. Pt is hopeful about the treatments but a little depressed. "He said there would be ebbs and flows. Sometimes people notice a difference right away and sometimes it takes time." Pt given emotional support and encouragement.

## 2022-03-29 NOTE — Plan of Care (Signed)
  Problem: Education: Goal: Knowledge of the prescribed therapeutic regimen will improve Outcome: Progressing   Problem: Activity: Goal: Imbalance in normal sleep/wake cycle will improve Outcome: Progressing   Problem: Coping: Goal: Coping ability will improve Outcome: Progressing Goal: Will verbalize feelings Outcome: Progressing   Problem: Health Behavior/Discharge Planning: Goal: Compliance with therapeutic regimen will improve Outcome: Progressing   Problem: Role Relationship: Goal: Will demonstrate positive changes in social behaviors and relationships Outcome: Progressing

## 2022-03-30 NOTE — Progress Notes (Signed)
[facility] MD Progress Note  03/30/2022 2:43 PM John [name]  MRN:  [id number] Subjective: Follow-up 81 year old man with depression.  Patient acknowledges his mood is a little better today.  Focuses instead on his anxiety a lot of which is somatically based.  Had an episode of mild orthostasis without falling down and he is worried about that.  I explained to him that this was a normal phenomenon and something that he should anticipate by making sure he is safe when he stands up.  Blood pressure as checked looks good. 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   Eczema    Elevated PSA    H/O Osgood-Schlatter disease    Hand deformity, congenital    Hearing loss bilateral   Hemorrhoids    Horner syndrome    Hypertension    Migraine    Obesity    PONV (postoperative nausea and vomiting)    Prostate cancer ([place]) 2020   Rosacea    Sleep apnea     Past Surgical History:  Procedure Laterality Date   ANKLE ARTHROSCOPY Left    fracture   CHOLECYSTECTOMY  2007   CRANIOTOMY  1958   left frontal craniotomy for epidural hematoma   INSERTION OF MESH  01/02/2022   Procedure: INSERTION OF MESH;  Surgeon: Herbert Pun, MD;  Location: [facility] ORS;  Service: General;;   LIGAMENT REPAIR Right    thumb   RETINAL DETACHMENT SURGERY     TREATMENT FISTULA ANAL  2006   Family History:  Family History  Problem Relation Age of Onset   Osteoporosis Mother    Depression Mother    Prostate cancer Father    Hypertension Father    Diabetes Brother    Kidney disease Neg Hx    Family Psychiatric  History: See  previous Social History:  Social History   Substance and Sexual Activity  Alcohol Use Not Currently     Social History   Substance and Sexual Activity  Drug Use No    Social History   Socioeconomic History   Marital status: Married    Spouse name: [name]   Number of children: 2   Years of education: Not on file   Highest education level: Not on file  Occupational History   Not on file  Tobacco Use   Smoking status: Never   Smokeless tobacco: Never  Vaping Use   Vaping Use: Never used  Substance and Sexual Activity   Alcohol use: Not Currently   Drug use: No   Sexual activity: Yes    Birth control/protection: None  Other Topics Concern   Not on file  Social History Narrative   Live at home with wife.    Social Determinants of Health   Financial Resource Strain: Not on file  Food Insecurity: Not on file  Transportation Needs: Not on file  Physical Activity: Not on file  Stress: Not on file  Social Connections: Not on file   Additional Social History:  Sleep: Fair  Appetite:  Fair  Current Medications: Current Facility-Administered Medications  Medication Dose Route Frequency Provider Last Rate Last Admin   acetaminophen (TYLENOL) tablet 650 mg  650 mg Oral Q6H PRN Sherlon Handing, NP       alum & mag hydroxide-simeth (MAALOX/MYLANTA) 200-200-20 MG/5ML suspension 30 mL  30 mL Oral Q4H PRN Waldon Merl F, NP       hydrochlorothiazide (HYDRODIURIL) tablet 12.5 mg  12.5 mg Oral Daily Aerabella Galasso T, MD   12.5 mg at 03/30/22 3845   LORazepam (ATIVAN) tablet 0.5 mg  0.5 mg Oral Q4H PRN Parks Ranger, DO   0.5 mg at 03/29/22 2124   losartan (COZAAR) tablet 50 mg  50 mg Oral Daily Parks Ranger, DO   50 mg at 03/30/22 3646   magnesium hydroxide (MILK OF MAGNESIA) suspension 30 mL  30 mL Oral Daily PRN Waldon Merl F, NP   30 mL at 03/24/22 2017   metoprolol succinate (TOPROL-XL) 24 hr tablet 50 mg  50  mg Oral QPC breakfast Parks Ranger, DO   50 mg at 03/30/22 8032   midazolam (VERSED) injection 2 mg  2 mg Intravenous Once Danuta Huseman T, MD       tamsulosin [facility]) capsule 0.4 mg  0.4 mg Oral Q2200 Caroline Sauger, NP   0.4 mg at 03/29/22 2123   traZODone (DESYREL) tablet 50 mg  50 mg Oral QHS PRN Parks Ranger, DO   50 mg at 03/29/22 2123   venlafaxine XR (EFFEXOR-XR) 24 hr capsule 300 mg  300 mg Oral QPC breakfast Parks Ranger, DO   300 mg at 03/30/22 1224    Lab Results:  Results for orders placed or performed during the hospital encounter of 03/13/22 (from the past 48 hour(s))  Glucose, capillary     Status: Abnormal   Collection Time: 03/29/22  7:54 AM  Result Value Ref Range   Glucose-Capillary 129 (H) 70 - 99 mg/dL    Comment: Glucose reference range applies only to samples taken after fasting for at least 8 hours.  Glucose, capillary     Status: Abnormal   Collection Time: 03/29/22 12:42 PM  Result Value Ref Range   Glucose-Capillary 118 (H) 70 - 99 mg/dL    Comment: Glucose reference range applies only to samples taken after fasting for at least 8 hours.    Blood Alcohol level:  Lab Results  Component Value Date   ETH <10 82/50/0370    Metabolic Disorder Labs: Lab Results  Component Value Date   HGBA1C 5.6 03/19/2022   MPG 114.02 03/19/2022   No results found for: PROLACTIN Lab Results  Component Value Date   CHOL 131 03/19/2022   TRIG 92 03/19/2022   HDL 33 (L) 03/19/2022   CHOLHDL 4.0 03/19/2022   VLDL 18 03/19/2022   LDLCALC 80 03/19/2022    Physical Findings: AIMS:  , ,  ,  ,    CIWA:    COWS:     Musculoskeletal: Strength & Muscle Tone: within normal limits Gait & Station: normal Patient leans: N/A  Psychiatric Specialty Exam:  Presentation  General Appearance: Appropriate for Environment  Eye Contact:Good  Speech:Clear and Coherent  Speech Volume:Normal  Handedness:No data recorded  Mood and  Affect  Mood:Depressed  Affect:Congruent   Thought Process  Thought Processes:Coherent  Descriptions of Associations:Intact  Orientation:Full (Time, Place and Person)  Thought Content:Logical  History of Schizophrenia/Schizoaffective disorder:No  Duration of Psychotic Symptoms:No data recorded  Hallucinations:No data recorded Ideas of Reference:None  Suicidal Thoughts:No data recorded Homicidal Thoughts:No data recorded  Sensorium  Memory:Immediate Good; Recent Good; Remote Good  Judgment:No data recorded Insight:Good   Executive Functions  Concentration:Good  Attention Span:Good  [place]  Language:Good   Psychomotor Activity  Psychomotor Activity:No data recorded  Assets  Assets:Desire for Improvement; Armed forces logistics/support/administrative officer; Financial Resources/Insurance; Housing; Intimacy; Social Support; Resilience; Physical Health   Sleep  Sleep:No data recorded   Physical Exam: Physical Exam Vitals and nursing note reviewed.  Constitutional:      Appearance: Normal appearance.  HENT:     Head: Normocephalic and atraumatic.     Mouth/Throat:     Pharynx: Oropharynx is clear.  Eyes:     Pupils: Pupils are equal, round, and reactive to light.  Cardiovascular:     Rate and Rhythm: Normal rate and regular rhythm.  Pulmonary:     Effort: Pulmonary effort is normal.     Breath sounds: Normal breath sounds.  Abdominal:     General: Abdomen is flat.     Palpations: Abdomen is soft.  Musculoskeletal:        General: Normal range of motion.  Skin:    General: Skin is warm and dry.  Neurological:     General: No focal deficit present.     Mental Status: He is alert. Mental status is at baseline.  Psychiatric:        Attention and Perception: Attention normal.        Mood and Affect: Mood normal.        Speech: Speech normal.        Behavior: Behavior normal.        Thought Content: Thought content normal.        Cognition and  Memory: Cognition normal.   Review of Systems  Constitutional: Negative.   HENT: Negative.    Eyes: Negative.   Respiratory: Negative.    Cardiovascular: Negative.   Gastrointestinal: Negative.   Musculoskeletal: Negative.   Skin: Negative.   Neurological: Negative.   Psychiatric/Behavioral: Negative.    Blood pressure 137/79, pulse 68, temperature 98 F (36.7 C), resp. rate 18, height '6\' 2"'$  (1.88 m), weight 100 kg, SpO2 100 %. Body mass index is 28.31 kg/m.   Treatment Plan Summary: Medication management and Plan no change to medicine.  Lots of supportive counseling and therapy.  Next ECT Monday  [name], MD 03/30/2022, 2:43 PM

## 2022-03-30 NOTE — BH IP Treatment Plan (Signed)
Interdisciplinary Treatment and Diagnostic Plan Update  03/30/2022 Time of Session: 9:00AM John [name] MRN: [id number]  Principal Diagnosis: MDD (major depressive disorder)  Secondary Diagnoses: Principal Problem:   MDD (major depressive disorder)   Current Medications:  Current Facility-Administered Medications  Medication Dose Route Frequency Provider Last Rate Last Admin   acetaminophen (TYLENOL) tablet 650 mg  650 mg Oral Q6H PRN Sherlon Handing, NP       alum & mag hydroxide-simeth (MAALOX/MYLANTA) 200-200-20 MG/5ML suspension 30 mL  30 mL Oral Q4H PRN Waldon Merl F, NP       hydrochlorothiazide (HYDRODIURIL) tablet 12.5 mg  12.5 mg Oral Daily Clapacs, John T, MD   12.5 mg at 03/29/22 1342   LORazepam (ATIVAN) tablet 0.5 mg  0.5 mg Oral Q4H PRN Parks Ranger, DO   0.5 mg at 03/29/22 2124   losartan (COZAAR) tablet 50 mg  50 mg Oral Daily Parks Ranger, DO   50 mg at 03/29/22 1343   magnesium hydroxide (MILK OF MAGNESIA) suspension 30 mL  30 mL Oral Daily PRN Waldon Merl F, NP   30 mL at 03/24/22 2017   metoprolol succinate (TOPROL-XL) 24 hr tablet 50 mg  50 mg Oral QPC breakfast Parks Ranger, DO   50 mg at 03/29/22 1344   midazolam (VERSED) injection 2 mg  2 mg Intravenous Once Clapacs, John T, MD       tamsulosin [facility]) capsule 0.4 mg  0.4 mg Oral Q2200 Caroline Sauger, NP   0.4 mg at 03/29/22 2123   traZODone (DESYREL) tablet 50 mg  50 mg Oral QHS PRN Parks Ranger, DO   50 mg at 03/29/22 2123   venlafaxine XR (EFFEXOR-XR) 24 hr capsule 300 mg  300 mg Oral QPC breakfast Parks Ranger, DO   300 mg at 03/29/22 1343   PTA Medications: Facility-Administered Medications Prior to Admission  Medication Dose Route Frequency Provider Last Rate Last Admin   gentamicin (GARAMYCIN) injection 80 mg  80 mg Intramuscular Once Hollice Espy, MD       levofloxacin [facility]) tablet 500 mg  500 mg Oral Once Hollice Espy, MD        Medications Prior to Admission  Medication Sig Dispense Refill Last Dose   aspirin EC 81 MG tablet Take 81 mg by mouth daily.      atenolol (TENORMIN) 50 MG tablet Take 50 mg by mouth daily.  0    buPROPion ER (WELLBUTRIN SR) 100 MG 12 hr tablet Take 100 mg by mouth 2 (two) times daily.      glipiZIDE (GLUCOTROL) 5 MG tablet Take 10 mg by mouth 2 (two) times daily before a meal.      oxyCODONE-acetaminophen (PERCOCET) 5-325 MG tablet Take 1 tablet by mouth every 4 (four) hours as needed for severe pain. (Patient not taking: Reported on 03/12/2022) 20 tablet 0    SUMAtriptan (IMITREX) 100 MG tablet TAKE 1 TABLET BY MOUTH ONCE AS NEEDED FOR MIGRAINE. MAY REPEAT 2ND DOSE AFTER 2 HOUR IF NEEDED      tamsulosin (FLOMAX) 0.4 MG CAPS capsule TAKE 1 CAPSULE (0.4 MG TOTAL) BY MOUTH DAILY AFTER SUPPER. 90 capsule 3    venlafaxine (EFFEXOR) 37.5 MG tablet Take 75 mg by mouth daily.       Patient Stressors: Other: vision issues after surgery.    Patient Strengths: Work Software engineer Modalities: Medication Management, Group therapy, Case management,  1 to 1 session with clinician, Psychoeducation, Recreational therapy.  Physician Treatment Plan for Primary Diagnosis: MDD (major depressive disorder) Long Term Goal(s): Improvement in symptoms so as ready for discharge   Short Term Goals: Ability to identify changes in lifestyle to reduce recurrence of condition will improve Ability to verbalize feelings will improve Ability to disclose and discuss suicidal ideas Ability to demonstrate self-control will improve Ability to identify and develop effective coping behaviors will improve Ability to maintain clinical measurements within normal limits will improve Compliance with prescribed medications will improve Ability to identify triggers associated with substance abuse/mental health issues will improve  Medication Management: Evaluate patient's response, side effects, and tolerance of  medication regimen.  Therapeutic Interventions: 1 to 1 sessions, Unit Group sessions and Medication administration.  Evaluation of Outcomes: Progressing  Physician Treatment Plan for Secondary Diagnosis: Principal Problem:   MDD (major depressive disorder)  Long Term Goal(s): Improvement in symptoms so as ready for discharge   Short Term Goals: Ability to identify changes in lifestyle to reduce recurrence of condition will improve Ability to verbalize feelings will improve Ability to disclose and discuss suicidal ideas Ability to demonstrate self-control will improve Ability to identify and develop effective coping behaviors will improve Ability to maintain clinical measurements within normal limits will improve Compliance with prescribed medications will improve Ability to identify triggers associated with substance abuse/mental health issues will improve     Medication Management: Evaluate patient's response, side effects, and tolerance of medication regimen.  Therapeutic Interventions: 1 to 1 sessions, Unit Group sessions and Medication administration.  Evaluation of Outcomes: Progressing   RN Treatment Plan for Primary Diagnosis: MDD (major depressive disorder) Long Term Goal(s): Knowledge of disease and therapeutic regimen to maintain health will improve  Short Term Goals: Ability to remain free from injury will improve, Ability to verbalize frustration and anger appropriately will improve, Ability to demonstrate self-control, Ability to participate in decision making will improve, Ability to verbalize feelings will improve, Ability to disclose and discuss suicidal ideas, Ability to identify and develop effective coping behaviors will improve, and Compliance with prescribed medications will improve  Medication Management: RN will administer medications as ordered by provider, will assess and evaluate patient's response and provide education to patient for prescribed medication.  RN will report any adverse and/or side effects to prescribing provider.  Therapeutic Interventions: 1 on 1 counseling sessions, Psychoeducation, Medication administration, Evaluate responses to treatment, Monitor vital signs and CBGs as ordered, Perform/monitor CIWA, COWS, AIMS and Fall Risk screenings as ordered, Perform wound care treatments as ordered.  Evaluation of Outcomes: Progressing   LCSW Treatment Plan for Primary Diagnosis: MDD (major depressive disorder) Long Term Goal(s): Safe transition to appropriate next level of care at discharge, Engage patient in therapeutic group addressing interpersonal concerns.  Short Term Goals: Engage patient in aftercare planning with referrals and resources, Increase social support, Increase ability to appropriately verbalize feelings, Increase emotional regulation, Facilitate acceptance of mental health diagnosis and concerns, Identify triggers associated with mental health/substance abuse issues, and Increase skills for wellness and recovery  Therapeutic Interventions: Assess for all discharge needs, 1 to 1 time with Social worker, Explore available resources and support systems, Assess for adequacy in community support network, Educate family and significant other(s) on suicide prevention, Complete Psychosocial Assessment, Interpersonal group therapy.  Evaluation of Outcomes: Progressing   Progress in Treatment: Attending groups: Yes. Participating in groups: Yes. Taking medication as prescribed: Yes. Toleration medication: Yes. Family/Significant other contact made: Yes, individual(s) contacted:  SPE completed with patient's wife, Tanush Drees. Patient understands diagnosis: Yes.  Discussing patient identified problems/goals with staff: Yes. Medical problems stabilized or resolved: Yes. Denies suicidal/homicidal ideation: Yes. Issues/concerns per patient self-inventory: No. Other: None.   New problem(s) identified: No, Describe:  None.    New Short Term/Long Term Goal(s): Patient to work towards, medication management for mood stabilization; elimination of SI thoughts; development of comprehensive mental wellness plan. Update 03/20/22: No changes at this time. Update 03/25/22: No changes at this time. Update 03/30/2022: No changes at this time.   Patient Goals: "work on my depression and anxiety"   Update 03/20/22: No changes at this time. Update 03/25/22: No changes at this time. Update 03/30/2022: No changes at this time.   Discharge Plan or Barriers: CSW will assist pt with development of appropriate discharge/aftercare plan. No psychosocial barriers identified regarding pt's ability to return home. Update 03/20/22: No changes at this time. Update 03/25/22: No changes at this time. Update 03/30/2022: No changes at this time.   Reason for Continuation of Hospitalization: Anxiety Depression Medication stabilization  Estimated Length of Stay: TBD  Last 3 [place] Suicide Severity Risk Score: [place] Admission (Current) from 03/13/2022 in [place] ED from 03/12/2022 in [place] Admission (Discharged) from 01/02/2022 in [place] No Risk No Risk No Risk       Last PHQ 2/9 Scores:    12/21/2021    8:59 AM 04/22/2017   10:46 AM  Depression screen PHQ 2/9  Decreased Interest 0 0  Down, Depressed, Hopeless 0 0  PHQ - 2 Score 0 0    Scribe for Treatment Team: Berniece Salines, Latanya Presser 03/30/2022 9:22 AM

## 2022-03-30 NOTE — Progress Notes (Signed)
   03/30/22 1955  Psych Admission Type (Psych Patients Only)  Admission Status Voluntary  Psychosocial Assessment  Patient Complaints None  Eye Contact Fair  Facial Expression Other (Comment) (appropriate)  Affect Appropriate to circumstance  Speech Logical/coherent  Interaction Assertive  Motor Activity Slow  Appearance/Hygiene In scrubs;Unremarkable  Behavior Characteristics Cooperative;Appropriate to situation  Mood Pleasant  Thought Process  Coherency WDL  Content WDL  Delusions None reported or observed  Perception WDL  Hallucination None reported or observed  Judgment WDL  Confusion None  Danger to Self  Current suicidal ideation? Denies  Danger to Others  Danger to Others None reported or observed   Progress note   D: Pt seen in dayroom interacting with peers. Pt denies SI, HI, AVH. Pt rates pain  0/10. Pt rates anxiety  0/10 and depression  0/10. Pt says he feels good today. He went outside for recreation time. Pt says he has a no complaints today. He has eaten well and gone to groups. Pt said he slept well overall but did have some weird dreams. Pt denied the need for Ativan tonight at bedtime. "I don't think I need that." Pt is brighter and is smiling and laughing with peers. Pt says he is scheduled for ECT on Monday and wonders when he can be discharged. Pt encouraged to speak with provider about this and if his ECT sessions need to be continued as outpatient.  A: Pt provided support and encouragement. Pt given scheduled medication as prescribed. PRNs as appropriate. Q15 min checks for safety.   R: Pt safe on the unit. Will continue to monitor.

## 2022-03-30 NOTE — Progress Notes (Signed)
Assumed care of Pt from 0730. He is assessed in his room, continues to endorse feeling depressed and anxious but is hopeful. He was med compliant and is eating his meals without any other issues. Pt was assisted with shaving and has been interacting with some of his peers in the unit. Vet is currently out for fresh air break. He was observed ambulating with a steady gait with no falls or unsafe behavior noted thus far. Q15 min observations maintained for safety and support provided as needed.

## 2022-03-31 ENCOUNTER — Other Ambulatory Visit: Payer: Self-pay | Admitting: Psychiatry

## 2022-03-31 NOTE — Plan of Care (Signed)
Patient calm on unit during shift.  Pt denies SI/HI/AV/H and contracts for safety.  Pt did have minimal participation in therapeutic milieu.  Pt did endorse Anxiety.  Pt denies restful sleep and reports "weird dreams from medication from sleep that scared me". Pt did have adequate intake and fluids.  Q 15 minute safety checks in place.  Will continue plan of care.      Problem: Education: Goal: Utilization of techniques to improve thought processes will improve Outcome: Progressing   Problem: Education: Goal: Knowledge of the prescribed therapeutic regimen will improve Outcome: Progressing

## 2022-03-31 NOTE — Progress Notes (Signed)
[facility] MD Progress Note  03/31/2022 12:54 PM John [name]  MRN:  [id number] Subjective: Follow-up patient with depression.  Yesterday he said he felt very good in the evening.  Apparently all the patients were singing hymns together and enjoying themselves last night.  He characterizes himself as feeling more down today but he is well groomed out of his room and actually chatting very nicely with another patient.  No active suicidal ideation.  No physical complaints. 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   Eczema    Elevated PSA    H/O Osgood-Schlatter disease    Hand deformity, congenital    Hearing loss bilateral   Hemorrhoids    Horner syndrome    Hypertension    Migraine    Obesity    PONV (postoperative nausea and vomiting)    Prostate cancer ([place]) 2020   Rosacea    Sleep apnea     Past Surgical History:  Procedure Laterality Date   ANKLE ARTHROSCOPY Left    fracture   CHOLECYSTECTOMY  2007   CRANIOTOMY  1958   left frontal craniotomy for epidural hematoma   INSERTION OF MESH  01/02/2022   Procedure: INSERTION OF MESH;  Surgeon: Herbert Pun, MD;  Location: [facility] ORS;  Service: General;;   LIGAMENT REPAIR Right    thumb   RETINAL DETACHMENT SURGERY     TREATMENT FISTULA ANAL  2006   Family History:  Family History  Problem Relation Age of Onset   Osteoporosis Mother    Depression Mother    Prostate cancer Father    Hypertension Father    Diabetes Brother    Kidney disease Neg Hx    Family Psychiatric  History: See previous Social History:  Social History    Substance and Sexual Activity  Alcohol Use Not Currently     Social History   Substance and Sexual Activity  Drug Use No    Social History   Socioeconomic History   Marital status: Married    Spouse name: [name]   Number of children: 2   Years of education: Not on file   Highest education level: Not on file  Occupational History   Not on file  Tobacco Use   Smoking status: Never   Smokeless tobacco: Never  Vaping Use   Vaping Use: Never used  Substance and Sexual Activity   Alcohol use: Not Currently   Drug use: No   Sexual activity: Yes    Birth control/protection: None  Other Topics Concern   Not on file  Social History Narrative   Live at home with wife.    Social Determinants of Health   Financial Resource Strain: Not on file  Food Insecurity: Not on file  Transportation Needs: Not on file  Physical Activity: Not on file  Stress: Not on file  Social Connections: Not on file   Additional Social History:                         Sleep: Fair  Appetite:  Fair  Current Medications: Current Facility-Administered  Medications  Medication Dose Route Frequency Provider Last Rate Last Admin   acetaminophen (TYLENOL) tablet 650 mg  650 mg Oral Q6H PRN Sherlon Handing, NP       alum & mag hydroxide-simeth (MAALOX/MYLANTA) 200-200-20 MG/5ML suspension 30 mL  30 mL Oral Q4H PRN Waldon Merl F, NP       hydrochlorothiazide (HYDRODIURIL) tablet 12.5 mg  12.5 mg Oral Daily Fontaine Hehl T, MD   12.5 mg at 03/31/22 1517   LORazepam (ATIVAN) tablet 0.5 mg  0.5 mg Oral Q4H PRN Parks Ranger, DO   0.5 mg at 03/31/22 0005   losartan (COZAAR) tablet 50 mg  50 mg Oral Daily Parks Ranger, DO   50 mg at 03/31/22 6160   magnesium hydroxide (MILK OF MAGNESIA) suspension 30 mL  30 mL Oral Daily PRN Waldon Merl F, NP   30 mL at 03/24/22 2017   metoprolol succinate (TOPROL-XL) 24 hr tablet 50 mg  50 mg Oral QPC breakfast Parks Ranger, DO   50 mg at 03/31/22 0935   midazolam (VERSED) injection 2 mg  2 mg Intravenous Once Annalaura Sauseda T, MD       tamsulosin [facility]) capsule 0.4 mg  0.4 mg Oral Q2200 Caroline Sauger, NP   0.4 mg at 03/30/22 2107   traZODone (DESYREL) tablet 50 mg  50 mg Oral QHS PRN Parks Ranger, DO   50 mg at 03/30/22 2107   venlafaxine XR (EFFEXOR-XR) 24 hr capsule 300 mg  300 mg Oral QPC breakfast Parks Ranger, DO   300 mg at 03/31/22 0935    Lab Results: No results found for this or any previous visit (from the past 48 hour(s)).  Blood Alcohol level:  Lab Results  Component Value Date   ETH <10 73/71/0626    Metabolic Disorder Labs: Lab Results  Component Value Date   HGBA1C 5.6 03/19/2022   MPG 114.02 03/19/2022   No results found for: PROLACTIN Lab Results  Component Value Date   CHOL 131 03/19/2022   TRIG 92 03/19/2022   HDL 33 (L) 03/19/2022   CHOLHDL 4.0 03/19/2022   VLDL 18 03/19/2022   LDLCALC 80 03/19/2022    Physical Findings: AIMS:  , ,  ,  ,    CIWA:    COWS:     Musculoskeletal: Strength & Muscle Tone: within normal limits Gait & Station: normal Patient leans: N/A  Psychiatric Specialty Exam:  Presentation  General Appearance: Appropriate for Environment  Eye Contact:Good  Speech:Clear and Coherent  Speech Volume:Normal  Handedness:No data recorded  Mood and Affect  Mood:Depressed  Affect:Congruent   Thought Process  Thought Processes:Coherent  Descriptions of Associations:Intact  Orientation:Full (Time, Place and Person)  Thought Content:Logical  History of Schizophrenia/Schizoaffective disorder:No  Duration of Psychotic Symptoms:No data recorded Hallucinations:No data recorded Ideas of Reference:None  Suicidal Thoughts:No data recorded Homicidal Thoughts:No data recorded  Sensorium  Memory:Immediate Good; Recent Good; Remote Good  Judgment:No data recorded Insight:Good   Executive Functions   Concentration:Good  Attention Span:Good  [place] of Knowledge:Good  Language:Good   Psychomotor Activity  Psychomotor Activity:No data recorded  Assets  Assets:Desire for Improvement; Armed forces logistics/support/administrative officer; Financial Resources/Insurance; Housing; Intimacy; Social Support; Resilience; Physical Health   Sleep  Sleep:No data recorded   Physical Exam: Physical Exam Vitals and nursing note reviewed.  Constitutional:      Appearance: Normal appearance.  HENT:     Head: Normocephalic and atraumatic.     Mouth/Throat:  Pharynx: Oropharynx is clear.  Eyes:     Pupils: Pupils are equal, round, and reactive to light.  Cardiovascular:     Rate and Rhythm: Normal rate and regular rhythm.  Pulmonary:     Effort: Pulmonary effort is normal.     Breath sounds: Normal breath sounds.  Abdominal:     General: Abdomen is flat.     Palpations: Abdomen is soft.  Musculoskeletal:        General: Normal range of motion.  Skin:    General: Skin is warm and dry.  Neurological:     General: No focal deficit present.     Mental Status: He is alert. Mental status is at baseline.  Psychiatric:        Mood and Affect: Mood normal.        Thought Content: Thought content normal.   Review of Systems  Constitutional: Negative.   HENT: Negative.    Eyes: Negative.   Respiratory: Negative.    Cardiovascular: Negative.   Gastrointestinal: Negative.   Musculoskeletal: Negative.   Skin: Negative.   Neurological: Negative.   Psychiatric/Behavioral:  Negative for depression, hallucinations, substance abuse and suicidal ideas. The patient is nervous/anxious.   Blood pressure 130/74, pulse 69, temperature 97.7 F (36.5 C), resp. rate 18, height '6\' 2"'$  (1.88 m), weight 100 kg, SpO2 100 %. Body mass index is 28.31 kg/m.   Treatment Plan Summary: Plan no change to medication.  30 ECT treatment scheduled for tomorrow.  Patient is agreeable.  [name], MD 03/31/2022, 12:54  PM

## 2022-03-31 NOTE — Group Note (Signed)
[facility] LCSW Group Therapy Note   Group Date: 03/31/2022 Start Time: 1430 End Time: 1530  Type of Therapy/Topic:  Group Therapy:  Feelings about Diagnosis  Participation Level:  Active     Description of Group:    This group will allow patients to explore their thoughts and feelings about diagnoses they have received. Patients will be guided to explore their level of understanding and acceptance of these diagnoses. Facilitator will encourage patients to process their thoughts and feelings about the reactions of others to their diagnosis, and will guide patients in identifying ways to discuss their diagnosis with significant others in their lives. This group will be process-oriented, with patients participating in exploration of their own experiences as well as giving and receiving support and challenge from other group members.   Therapeutic Goals: 1. Patient will demonstrate understanding of diagnosis as evidence by identifying two or more symptoms of the disorder:  2. Patient will be able to express two feelings regarding the diagnosis 3. Patient will demonstrate ability to communicate their needs through discussion and/or role plays  Summary of Patient Progress: Patient was present for the entirety of the group session. Patient was an active listener and participated in the topic of discussion and added nuance to topic of conversation. Patient identified that his depression is worsened by "deep seeded triggers" that stem from a baseball injury when he was younger. Patient shared that his worries are centered on his physical health. Patient identified that a recent surgery triggered his current depressive episode due to the procedure affecting his vision. Patient reports hopefulness that ECT treatments will be helpful as they were in the past when he received them 20 years ago.    Therapeutic Modalities:   Cognitive Behavioral Therapy Brief Therapy Feelings Identification    Kenna Gilbert  Kalyani Maeda, LCSWA

## 2022-04-01 ENCOUNTER — Encounter: Payer: Self-pay | Admitting: Internal Medicine

## 2022-04-01 ENCOUNTER — Ambulatory Visit: Payer: Medicare Other | Attending: Psychiatry

## 2022-04-01 ENCOUNTER — Ambulatory Visit: Payer: Medicare Other

## 2022-04-01 ENCOUNTER — Other Ambulatory Visit: Payer: Self-pay

## 2022-04-01 ENCOUNTER — Inpatient Hospital Stay
Admission: AD | Admit: 2022-04-01 | Discharge: 2022-04-04 | DRG: 309 | Disposition: A | Discharge status: Other Acute Inpatient Hospital | Payer: Medicare Other | Source: Ambulatory Visit | Attending: Internal Medicine | Admitting: Internal Medicine

## 2022-04-01 ENCOUNTER — Encounter: Payer: Self-pay | Admitting: Psychiatry

## 2022-04-01 ENCOUNTER — Encounter (HOSPITAL_COMMUNITY): Payer: Medicare Other

## 2022-04-01 ENCOUNTER — Inpatient Hospital Stay: Payer: Medicare Other | Admitting: Certified Registered"

## 2022-04-01 DIAGNOSIS — Z7982 Long term (current) use of aspirin: Secondary | ICD-10-CM

## 2022-04-01 DIAGNOSIS — F418 Other specified anxiety disorders: Secondary | ICD-10-CM | POA: Diagnosis present

## 2022-04-01 DIAGNOSIS — I77819 Aortic ectasia, unspecified site: Secondary | ICD-10-CM | POA: Diagnosis present

## 2022-04-01 DIAGNOSIS — Z9049 Acquired absence of other specified parts of digestive tract: Secondary | ICD-10-CM

## 2022-04-01 DIAGNOSIS — Z8546 Personal history of malignant neoplasm of prostate: Secondary | ICD-10-CM

## 2022-04-01 DIAGNOSIS — Z6828 Body mass index (BMI) 28.0-28.9, adult: Secondary | ICD-10-CM

## 2022-04-01 DIAGNOSIS — Z818 Family history of other mental and behavioral disorders: Secondary | ICD-10-CM

## 2022-04-01 DIAGNOSIS — Z8042 Family history of malignant neoplasm of prostate: Secondary | ICD-10-CM

## 2022-04-01 DIAGNOSIS — Z8262 Family history of osteoporosis: Secondary | ICD-10-CM

## 2022-04-01 DIAGNOSIS — Z20822 Contact with and (suspected) exposure to covid-19: Secondary | ICD-10-CM | POA: Diagnosis present

## 2022-04-01 DIAGNOSIS — Z87898 Personal history of other specified conditions: Secondary | ICD-10-CM

## 2022-04-01 DIAGNOSIS — Z87820 Personal history of traumatic brain injury: Secondary | ICD-10-CM

## 2022-04-01 DIAGNOSIS — F322 Major depressive disorder, single episode, severe without psychotic features: Secondary | ICD-10-CM | POA: Diagnosis present

## 2022-04-01 DIAGNOSIS — I480 Paroxysmal atrial fibrillation: Principal | ICD-10-CM | POA: Diagnosis present

## 2022-04-01 DIAGNOSIS — Z79899 Other long term (current) drug therapy: Secondary | ICD-10-CM

## 2022-04-01 DIAGNOSIS — E785 Hyperlipidemia, unspecified: Secondary | ICD-10-CM | POA: Diagnosis present

## 2022-04-01 DIAGNOSIS — E669 Obesity, unspecified: Secondary | ICD-10-CM | POA: Diagnosis present

## 2022-04-01 DIAGNOSIS — I7771 Dissection of carotid artery: Secondary | ICD-10-CM | POA: Diagnosis present

## 2022-04-01 DIAGNOSIS — F419 Anxiety disorder, unspecified: Secondary | ICD-10-CM | POA: Diagnosis present

## 2022-04-01 DIAGNOSIS — E119 Type 2 diabetes mellitus without complications: Secondary | ICD-10-CM

## 2022-04-01 DIAGNOSIS — G902 Horner's syndrome: Secondary | ICD-10-CM | POA: Diagnosis present

## 2022-04-01 DIAGNOSIS — Z8249 Family history of ischemic heart disease and other diseases of the circulatory system: Secondary | ICD-10-CM

## 2022-04-01 DIAGNOSIS — Z833 Family history of diabetes mellitus: Secondary | ICD-10-CM

## 2022-04-01 DIAGNOSIS — G4733 Obstructive sleep apnea (adult) (pediatric): Secondary | ICD-10-CM | POA: Diagnosis present

## 2022-04-01 DIAGNOSIS — I493 Ventricular premature depolarization: Secondary | ICD-10-CM | POA: Diagnosis present

## 2022-04-01 DIAGNOSIS — I440 Atrioventricular block, first degree: Secondary | ICD-10-CM | POA: Diagnosis present

## 2022-04-01 DIAGNOSIS — H9193 Unspecified hearing loss, bilateral: Secondary | ICD-10-CM | POA: Diagnosis present

## 2022-04-01 DIAGNOSIS — G40909 Epilepsy, unspecified, not intractable, without status epilepticus: Secondary | ICD-10-CM | POA: Diagnosis present

## 2022-04-01 DIAGNOSIS — D751 Secondary polycythemia: Secondary | ICD-10-CM

## 2022-04-01 DIAGNOSIS — E1169 Type 2 diabetes mellitus with other specified complication: Secondary | ICD-10-CM

## 2022-04-01 DIAGNOSIS — I10 Essential (primary) hypertension: Secondary | ICD-10-CM

## 2022-04-01 DIAGNOSIS — G4730 Sleep apnea, unspecified: Secondary | ICD-10-CM | POA: Diagnosis present

## 2022-04-01 DIAGNOSIS — I4891 Unspecified atrial fibrillation: Principal | ICD-10-CM

## 2022-04-01 HISTORY — DX: Traumatic subdural hemorrhage with loss of consciousness status unknown, initial encounter: S06.5XAA

## 2022-04-01 LAB — BASIC METABOLIC PANEL
Anion gap: 11 (ref 5–15)
BUN: 22 mg/dL (ref 8–23)
CO2: 21 mmol/L — ABNORMAL LOW (ref 22–32)
Calcium: 9.4 mg/dL (ref 8.9–10.3)
Chloride: 107 mmol/L (ref 98–111)
Creatinine, Ser: 0.95 mg/dL (ref 0.61–1.24)
GFR, Estimated: >60 mL/min (ref >60)
Glucose, Bld: 129 mg/dL — ABNORMAL HIGH (ref 70–99)
Potassium: 3.5 mmol/L (ref 3.5–5.1)
Sodium: 139 mmol/L (ref 135–145)

## 2022-04-01 LAB — CBC
HCT: 52.7 % — ABNORMAL HIGH (ref 39.0–52.0)
Hemoglobin: 18.1 g/dL — ABNORMAL HIGH (ref 13.0–17.0)
MCH: 31 pg (ref 26.0–34.0)
MCHC: 34.3 g/dL (ref 30.0–36.0)
MCV: 90.2 fL (ref 80.0–100.0)
Platelets: 178 10*3/uL (ref 150–400)
RBC: 5.84 MIL/uL — ABNORMAL HIGH (ref 4.22–5.81)
RDW: 12.5 % (ref 11.5–15.5)
WBC: 5.8 10*3/uL (ref 4.0–10.5)
nRBC: 0 % (ref 0.0–0.2)

## 2022-04-01 LAB — TSH: TSH: 0.766 u[IU]/mL (ref 0.350–4.500)

## 2022-04-01 LAB — GLUCOSE, CAPILLARY
Glucose-Capillary: 147 mg/dL — ABNORMAL HIGH (ref 70–99)
Glucose-Capillary: 140 mg/dL — ABNORMAL HIGH (ref 70–99)
Glucose-Capillary: 171 mg/dL — ABNORMAL HIGH (ref 70–99)
Glucose-Capillary: 159 mg/dL — ABNORMAL HIGH (ref 70–99)

## 2022-04-01 LAB — MAGNESIUM: Magnesium: 2.1 mg/dL (ref 1.7–2.4)

## 2022-04-01 MED ORDER — VENLAFAXINE HCL ER 37.5 MG PO CP24
37.5000 mg | ORAL_CAPSULE | Freq: Every day | ORAL | Status: DC
  Administered 2022-04-02: 37.5 mg via ORAL
  Filled 2022-04-01: qty 1

## 2022-04-01 MED ORDER — KETOROLAC TROMETHAMINE 30 MG/ML IJ SOLN
INTRAMUSCULAR | Status: AC
  Filled 2022-04-01: qty 1

## 2022-04-01 MED ORDER — GLYCOPYRROLATE 0.2 MG/ML IJ SOLN
0.1000 mg | Freq: Once | INTRAMUSCULAR | Status: AC
  Administered 2022-04-01: 0.2 mg via INTRAVENOUS
  Filled 2022-04-01: qty 0.5

## 2022-04-01 MED ORDER — ENOXAPARIN SODIUM 40 MG/0.4ML IJ SOSY
40.0000 mg | PREFILLED_SYRINGE | INTRAMUSCULAR | Status: DC
  Administered 2022-04-01 – 2022-04-04 (×4): 40 mg via SUBCUTANEOUS
  Filled 2022-04-01 (×4): qty 0.4

## 2022-04-01 MED ORDER — ONDANSETRON HCL 4 MG/2ML IJ SOLN: 4.0000 mg | Freq: Three times a day (TID) | INTRAMUSCULAR | Status: DC | PRN

## 2022-04-01 MED ORDER — SENNA 8.6 MG PO TABS
1.0000 | ORAL_TABLET | Freq: Once | ORAL | Status: AC
  Administered 2022-04-01: 8.6 mg via ORAL
  Filled 2022-04-01: qty 1

## 2022-04-01 MED ORDER — GLYCOPYRROLATE 0.2 MG/ML IJ SOLN
INTRAMUSCULAR | Status: AC
  Filled 2022-04-01: qty 1

## 2022-04-01 MED ORDER — INSULIN ASPART 100 UNIT/ML IJ SOLN: 0.0000 [IU] | Freq: Every day | INTRAMUSCULAR | Status: DC

## 2022-04-01 MED ORDER — ATORVASTATIN CALCIUM 10 MG PO TABS
10.0000 mg | ORAL_TABLET | Freq: Every day | ORAL | Status: DC
Start: 2022-04-01 — End: 2022-04-04
  Administered 2022-04-01 – 2022-04-04 (×4): 10 mg via ORAL
  Filled 2022-04-01 (×6): qty 1

## 2022-04-01 MED ORDER — LORAZEPAM 1 MG PO TABS
1.0000 mg | ORAL_TABLET | ORAL | Status: DC | PRN
  Administered 2022-04-02: 1 mg via ORAL
  Filled 2022-04-01: qty 1

## 2022-04-01 MED ORDER — KETOROLAC TROMETHAMINE 30 MG/ML IJ SOLN
30.0000 mg | Freq: Once | INTRAMUSCULAR | Status: AC
  Administered 2022-04-01: 30 mg via INTRAVENOUS

## 2022-04-01 MED ORDER — HYDRALAZINE HCL 20 MG/ML IJ SOLN
5.0000 mg | INTRAMUSCULAR | Status: DC | PRN
  Administered 2022-04-02 – 2022-04-04 (×8): 5 mg via INTRAVENOUS
  Filled 2022-04-01 (×8): qty 1

## 2022-04-01 MED ORDER — ACETAMINOPHEN 325 MG PO TABS
650.0000 mg | ORAL_TABLET | Freq: Four times a day (QID) | ORAL | Status: DC | PRN
  Administered 2022-04-04: 650 mg via ORAL
  Filled 2022-04-01: qty 2

## 2022-04-01 MED ORDER — INSULIN ASPART 100 UNIT/ML IJ SOLN
0.0000 [IU] | Freq: Three times a day (TID) | INTRAMUSCULAR | Status: DC
  Administered 2022-04-02: 1 [IU] via SUBCUTANEOUS
  Administered 2022-04-02: 2 [IU] via SUBCUTANEOUS
  Administered 2022-04-02 – 2022-04-03 (×2): 1 [IU] via SUBCUTANEOUS
  Administered 2022-04-04: 3 [IU] via SUBCUTANEOUS
  Administered 2022-04-04: 1 [IU] via SUBCUTANEOUS
  Filled 2022-04-01 (×6): qty 1

## 2022-04-01 MED ORDER — METOPROLOL TARTRATE 5 MG/5ML IV SOLN: 2.5000 mg | INTRAVENOUS | Status: DC | PRN

## 2022-04-01 MED ORDER — LABETALOL HCL 5 MG/ML IV SOLN
INTRAVENOUS | Status: DC | PRN
  Administered 2022-04-01: 10 mg via INTRAVENOUS

## 2022-04-01 MED ORDER — SODIUM CHLORIDE 0.9 % IV SOLN
500.0000 mL | Freq: Once | INTRAVENOUS | Status: AC
  Administered 2022-04-01: 500 mL via INTRAVENOUS

## 2022-04-01 MED ORDER — MIDAZOLAM HCL 2 MG/2ML IJ SOLN
INTRAMUSCULAR | Status: AC
  Filled 2022-04-01: qty 2

## 2022-04-01 MED ORDER — MIDAZOLAM HCL 2 MG/2ML IJ SOLN: 2.0000 mg | Freq: Once | INTRAMUSCULAR | Status: DC

## 2022-04-01 MED ORDER — LOSARTAN POTASSIUM 50 MG PO TABS
50.0000 mg | ORAL_TABLET | Freq: Every day | ORAL | Status: DC
  Administered 2022-04-01 – 2022-04-02 (×2): 50 mg via ORAL
  Filled 2022-04-01 (×2): qty 1

## 2022-04-01 NOTE — H&P (Signed)
History and Physical    Ennio Houp IRS:[id number] DOB: [birth year] DOA: 04/01/2022  Referring MD/NP/PA:   PCP: Juluis Pitch, MD   Patient coming from:  The patient is coming from home.  At baseline, pt is independent for most of ADL.        Chief Complaint: abnormal EKG with A fib  HPI: John [name] is a 81 y.o. male with medical history significant of depression, anxiety, hypertension, diabetes mellitus, OSA, prostate cancer, Horner syndrome, dissection of carotid artery, subdural hematoma, epidural hematoma, who was found to have abnormal EKG with atrial fibrillation.  Patient is admitted to behavioral health unit on 5/17 due to severe depression and anxiety.  Patient is scheduled for ECT procedure by Dr. Weber Cooks today, but he was found to have abnormal EKG with atrial fibrillation.  The procedure is canceled. When I saw patient in PACU, patient denies any chest pain, palpitation, shortness breath.  No nausea, vomiting, diarrhea or abdominal pain.  Denies symptoms of UTI.  Patient is in depressed mood, very quiet and calm.  He denies suicidal homicidal ideations.  Data Reviewed: WBC 5.8, renal function okay.  Temperature 98, blood pressure 159/98, heart rate 50-80s, RR 21, oxygen saturation 98% on room air.  Patient had a negative CT of her head on 03/25/2022.  Patient is placed on telemetry bed for observation.  Dr. Sophronia Simas of cardiology is consulted.   EKG: I have personally reviewed.  Atrial fibrillation, QTc 476, LAD, poor R wave progression   Review of Systems:   General: no fevers, chills, no body weight gain, fatigue HEENT: no blurry vision, hearing changes or sore throat Respiratory: no dyspnea, coughing, wheezing CV: no chest pain, no palpitations GI: no nausea, vomiting, abdominal pain, diarrhea, constipation GU: no dysuria, burning on urination, increased urinary frequency, hematuria  Ext: no leg edema Neuro: no unilateral weakness, numbness, or tingling, no vision change or  hearing loss Skin: no rash, no skin tear. MSK: No muscle spasm, no deformity, no limitation of range of movement in spin Heme: No easy bruising.  Travel history: No recent long distant travel. Psychiatry: Patient has depressed mood, denies suicidal homicidal ideations.   Allergy: No Known Allergies  Past Medical History:  Diagnosis Date   Cancer ([place])    Depression    Depression    Diabetes mellitus without complication ([place])    type 2   Dissection of carotid artery ([place])    a. 06/2015 CT Head: abnl thickening of mid-dist R cervical ICA w/o narrowing - ? vasculitis and thrombosed/healing dissection; b. 01/2021 Carotid U/S: 1-39% bilat ICA stenoses.   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 Age 83 - struck in head w/ baseball --> s/p craniotomy.   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 Approx 2015 - eval in [place] - conservatively managed.    Past Surgical History:  Procedure Laterality Date   ANKLE ARTHROSCOPY Left    fracture   CHOLECYSTECTOMY  2007   [place]   left frontal craniotomy  for epidural hematoma   INSERTION OF MESH  01/02/2022   Procedure: INSERTION OF MESH;  Surgeon: Herbert Pun, MD;  Location: [facility] ORS;  Service: General;;   LIGAMENT REPAIR Right    thumb   RETINAL DETACHMENT SURGERY     TREATMENT FISTULA ANAL  2006    Social History:  reports that he has never smoked. He has never used smokeless tobacco. He reports that he does not currently use alcohol. He reports that he does not use drugs.  Family History:  Family History  Problem Relation Age of Onset   Osteoporosis Mother    Depression Mother     Prostate cancer Father    Hypertension Father    Diabetes Brother    Kidney disease Neg Hx      Prior to Admission medications   Medication Sig Start Date End Date Taking? Authorizing Provider  aspirin EC 81 MG tablet Take 81 mg by mouth daily.    [provider]    Physical Exam: Vitals:   04/01/22 1500 04/01/22 1629 04/01/22 1703 04/01/22 1738  BP: (!) 154/95 138/85 138/85 (!) 176/109  Pulse: 83 84  85  Resp:  '16 18 17  '$ Temp: 98.8 F (37.1 C)  98 F (36.7 C) (!) 97.1 F (36.2 C)  SpO2: 96% 96% 97% 99%   General: Not in acute distress HEENT:       Eyes: PERRL, EOMI, no scleral icterus.       ENT: No discharge from the ears and nose, no pharynx injection, no tonsillar enlargement.        Neck: No JVD, no bruit, no mass felt. Heme: No neck lymph node enlargement. Cardiac: S1/S2, irregularly irregular rhythm, no murmurs, No gallops or rubs. Respiratory: No rales, wheezing, rhonchi or rubs. GI: Soft, nondistended, nontender, no rebound pain, no organomegaly, BS present. GU: No hematuria Ext: No pitting leg edema bilaterally. 1+DP/PT pulse bilaterally. Musculoskeletal: No joint deformities, No joint redness or warmth, no limitation of ROM in spin. Skin: No rashes.  Neuro: Alert, oriented X3, cranial nerves II-XII grossly intact, moves all extremities normally.  Psych: Patient has depressed mood, denies suicidal homicidal ideations.  Labs on Admission: I have personally reviewed following labs and imaging studies  CBC: Recent Labs  Lab 04/01/22 1539  WBC 5.8  HGB 18.1*  HCT 52.7*  MCV 90.2  PLT 601   Basic Metabolic Panel: Recent Labs  Lab 03/26/22 1226 04/01/22 1539  NA 135 139  K 4.3 3.5  CL 100 107  CO2 27 21*  GLUCOSE 133* 129*  BUN 15 22  CREATININE 0.83 0.95  CALCIUM 9.6 9.4  MG  --  2.1   GFR: Estimated Creatinine Clearance: 79.6 mL/min (by C-G formula based on SCr of 0.95 mg/dL). Liver Function Tests: Recent Labs  Lab 03/26/22 1226   AST 30  ALT 32  ALKPHOS 66  BILITOT 1.6*  PROT 7.1  ALBUMIN 4.1   No results for input(s): LIPASE, AMYLASE in the last 168 hours. No results for input(s): AMMONIA in the last 168 hours. Coagulation Profile: No results for input(s): INR, PROTIME in the last 168 hours. Cardiac Enzymes: No results for input(s): CKTOTAL, CKMB, CKMBINDEX, TROPONINI in the last 168 hours. BNP (last 3 results) No results for input(s): PROBNP in the last 8760 hours. HbA1C: No results for input(s): HGBA1C in the last 72 hours. CBG: Recent Labs  Lab 03/29/22 0754 03/29/22 1242 04/01/22 0824 04/01/22 1339 04/01/22 1629  GLUCAP 129* 118* 147* 140*  171*   Lipid Profile: No results for input(s): CHOL, HDL, LDLCALC, TRIG, CHOLHDL, LDLDIRECT in the last 72 hours. Thyroid Function Tests: Recent Labs    04/01/22 1539  TSH 0.766   Anemia Panel: No results for input(s): VITAMINB12, FOLATE, FERRITIN, TIBC, IRON, RETICCTPCT in the last 72 hours. Urine analysis:    Component Value Date/Time   COLORURINE YELLOW (A) 11/09/2021 1706   APPEARANCEUR CLEAR (A) 11/09/2021 1706   APPEARANCEUR Clear 10/02/2021 1346   LABSPEC 1.028 11/09/2021 1706   PHURINE 5.0 11/09/2021 1706   GLUCOSEU >=500 (A) 11/09/2021 1706   HGBUR NEGATIVE 11/09/2021 1706   BILIRUBINUR NEGATIVE 11/09/2021 1706   BILIRUBINUR Negative 10/02/2021 1346   KETONESUR 5 (A) 11/09/2021 1706   PROTEINUR NEGATIVE 11/09/2021 1706   NITRITE NEGATIVE 11/09/2021 1706   LEUKOCYTESUR NEGATIVE 11/09/2021 1706   Sepsis Labs: '@LABRCNTIP'$ (procalcitonin:4,lacticidven:4) )No results found for this or any previous visit (from the past 240 hour(s)).   Radiological Exams on Admission: No results found.    Assessment/Plan Principal Problem:   New onset atrial fibrillation (HCC) Active Problems:   Dissection of carotid artery (HCC)   Depression with anxiety   Hypertension   Diabetes mellitus without complication ([place])   Principal Problem:   New  onset atrial fibrillation (HCC) Active Problems:   Dissection of carotid artery (HCC)   Depression with anxiety   Hypertension   Diabetes mellitus without complication ([place])   Assessment and Plan: * New onset atrial fibrillation ([place]) Patient may have PAF.  Current heart rate 51-85. CHA2DS2-VASc equals 5.  Patient has history of epidural hematoma and subdural hematoma.  May not be a good candidate for anticoagulant.  Consulted cardiology, Dr. Sophronia Simas.  -Placed on telemetry bed for observation -Hold off metoprolol since heart rate is okay -As needed 2.5 every 2 hours of metoprolol IV for heart rate > 125 -check Mg  -check TSH --> 0.766 -2d echo per card  Diabetes mellitus without complication ([place]) Recent A1c 5.6.  Well-controlled.  Patient taking glipizide -Sliding scale insulin  Hypertension - IV hydralazine as needed -Cozaar 50 mg daily  Depression with anxiety -Hydroxyzine, Ativan and Effexor per Dr. 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 Fletcher Anon of card  Admission status and Level of care: Telemetry Cardiac:   for obs     Severity of Illness:  The appropriate patient status for this patient is INPATIENT. Inpatient status is judged to be reasonable and necessary in order to provide the required intensity of service to ensure the patient's safety. The patient's presenting symptoms, physical exam findings, and initial radiographic and laboratory data in the context of their chronic comorbidities is felt to place them at high risk for further clinical deterioration. Furthermore, it is not anticipated that the patient will be medically stable for discharge from the hospital within 2 midnights of admission.   * I certify that at the point of admission it is my clinical  judgment that the patient will require inpatient hospital care spanning beyond 2 midnights from the point of admission due to high intensity of service, high risk for further deterioration and high frequency of surveillance required.*       Date of Service 04/01/2022    Ivor Costa [facility]  If 7PM-7AM, please contact night-coverage [email or web address] 04/01/2022, 6:17 PM

## 2022-04-01 NOTE — Anesthesia Preprocedure Evaluation (Signed)
Anesthesia Evaluation  Patient identified by MRN, date of birth, ID band Patient awake    Reviewed: Allergy & Precautions, H&P , NPO status , Patient's Chart, lab work & pertinent test results, reviewed documented beta blocker date and time   History of Anesthesia Complications (+) PONV and history of anesthetic complications  Airway Mallampati: II   Neck ROM: full    Dental  (+) Poor Dentition   Pulmonary sleep apnea ,    Pulmonary exam normal        Cardiovascular Exercise Tolerance: Poor hypertension, On Medications negative cardio ROS Normal cardiovascular exam Rhythm:regular Rate:Normal     Neuro/Psych  Headaches, PSYCHIATRIC DISORDERS Depression    GI/Hepatic negative GI ROS, Neg liver ROS,   Endo/Other  negative endocrine ROSdiabetes, Well Controlled, Type 2, Oral Hypoglycemic Agents  Renal/GU negative Renal ROS  negative genitourinary   Musculoskeletal   Abdominal   Peds  Hematology negative hematology ROS (+)   Anesthesia Other Findings Past Medical History: No date: Cancer ([place]) No date: Depression No date: Depression No date: Diabetes mellitus without complication (HCC)     Comment:  type 2 08/31/2015: Dissection of carotid artery (HCC) No date: Eczema No date: Elevated PSA No date: 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, congenital bilateral: Hearing loss No date: Hemorrhoids 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 No date: Hypertension No date: Migraine No date: Obesity No date: PONV (postoperative nausea and vomiting) 2020: Prostate cancer ([place]) No date: Rosacea No date: Sleep apnea Past Surgical History: No date: ANKLE ARTHROSCOPY; Left     Comment:  fracture 2007: CHOLECYSTECTOMY 1958: CRANIOTOMY     Comment:  left frontal craniotomy for epidural hematoma 01/02/2022: INSERTION OF MESH     Comment:  Procedure: INSERTION OF MESH;  Surgeon: Herbert Pun, MD;   Location: [facility] ORS;  Service: General;; No date: LIGAMENT REPAIR; Right     Comment:  thumb No date: RETINAL DETACHMENT SURGERY 2006: TREATMENT FISTULA ANAL BMI    Body Mass Index: 28.31 kg/m     Reproductive/Obstetrics negative OB ROS                             Anesthesia Physical Anesthesia Plan  ASA: 3  Anesthesia Plan: General   Post-op Pain Management:    Induction:   PONV Risk Score and Plan:   Airway Management Planned:   Additional Equipment:   Intra-op Plan:   Post-operative Plan:   Informed Consent: I have reviewed the patients History and Physical, chart, labs and discussed the procedure including the risks, benefits and alternatives for the proposed anesthesia with the patient or authorized representative who has indicated his/her understanding and acceptance.     Dental Advisory Given  Plan Discussed with: CRNA  Anesthesia Plan Comments:         Anesthesia Quick Evaluation

## 2022-04-01 NOTE — BHH Suicide Risk Assessment (Signed)
[facility] Discharge Suicide Risk Assessment   Principal Problem: MDD (major depressive disorder) Discharge Diagnoses: Principal Problem:   MDD (major depressive disorder)   Total Time spent with patient: 30 minutes  Musculoskeletal: Strength & Muscle Tone: within normal limits Gait & Station: normal Patient leans: N/A  Psychiatric Specialty Exam  Presentation  General Appearance: Appropriate for Environment  Eye Contact:Good  Speech:Clear and Coherent  Speech Volume:Normal  Handedness:No data recorded  Mood and Affect  Mood:Depressed  Duration of Depression Symptoms: Greater than two weeks  Affect:Congruent   Thought Process  Thought Processes:Coherent  Descriptions of Associations:Intact  Orientation:Full (Time, Place and Person)  Thought Content:Logical  History of Schizophrenia/Schizoaffective disorder:No  Duration of Psychotic Symptoms:No data recorded Hallucinations:No data recorded Ideas of Reference:None  Suicidal Thoughts:No data recorded Homicidal Thoughts:No data recorded  Sensorium  Memory:Immediate Good; Recent Good; Remote Good  Judgment:No data recorded Insight:Good   Executive Functions  Concentration:Good  Attention Span:Good  [place] of Knowledge:Good  Language:Good   Psychomotor Activity  Psychomotor Activity:No data recorded  Assets  Assets:Desire for Improvement; Armed forces logistics/support/administrative officer; Financial Resources/Insurance; Housing; Intimacy; Social Support; Resilience; Physical Health   Sleep  Sleep:No data recorded  Physical Exam: Physical Exam Vitals and nursing note reviewed.  Constitutional:      Appearance: Normal appearance.  HENT:     Head: Normocephalic and atraumatic.     Mouth/Throat:     Pharynx: Oropharynx is clear.  Eyes:     Pupils: Pupils are equal, round, and reactive to light.  Cardiovascular:     Rate and Rhythm: Normal rate and regular rhythm.  Pulmonary:     Effort: Pulmonary effort is  normal.     Breath sounds: Normal breath sounds.  Abdominal:     General: Abdomen is flat.     Palpations: Abdomen is soft.  Musculoskeletal:        General: Normal range of motion.  Skin:    General: Skin is warm and dry.  Neurological:     General: No focal deficit present.     Mental Status: He is alert. Mental status is at baseline.  Psychiatric:        Attention and Perception: He is inattentive.        Mood and Affect: Mood is anxious. Affect is blunt.        Speech: Speech is delayed.        Behavior: Behavior is withdrawn.        Thought Content: Thought content normal.        Cognition and Memory: Cognition is impaired.   Review of Systems  Constitutional: Negative.   HENT: Negative.    Eyes: Negative.   Respiratory: Negative.    Cardiovascular: Negative.   Gastrointestinal: Negative.   Musculoskeletal: Negative.   Skin: Negative.   Neurological: Negative.   Psychiatric/Behavioral:  Positive for depression and memory loss. Negative for hallucinations, substance abuse and suicidal ideas. The patient is nervous/anxious and has insomnia.   Blood pressure (!) 182/111, pulse 75, temperature 98 F (36.7 C), resp. rate 15, height '6\' 2"'$  (1.88 m), weight 100 kg, SpO2 99 %. Body mass index is 28.31 kg/m.  Mental Status Per Nursing Assessment::   On Admission:  NA  Demographic Factors:  Male, Age 41 or older, and Caucasian  Loss Factors: Decline in physical health  Historical Factors: Impulsivity  Risk Reduction Factors:   Living with another person, especially a relative, Positive social support, and Positive therapeutic relationship  Continued Clinical Symptoms:  Depression:   Insomnia  Cognitive Features That Contribute To Risk:  None    Suicide Risk:  Minimal: No identifiable suicidal ideation.  Patients presenting with no risk factors but with morbid ruminations; may be classified as minimal risk based on the severity of the depressive symptoms    Follow-up [place] Follow up on 04/18/2022.   Specialty: Behavioral Health Why: You have an appointment scheduled on Thursday June 22nd at 2pm with Dr. Modesta Messing. Please arrive at 1:45pm with insurance information. Please contact office if you need to reschedule. Thanks! Contact information: [place] [phone]                Plan Of Care/Follow-up recommendations:  Patient is being transferred to the medical service.  I do not think he is likely to need a one-to-one but will need follow-up from the psychiatric team.  [name], MD 04/01/2022, 1:50 PM

## 2022-04-01 NOTE — Plan of Care (Addendum)
Pt sitting on edge of bed; cooperative, anxious. Pt states "I'm feeling anxious." He says that he invited his wife to visit but he was worried about how he looked. He currently denies pain and SI/HI/AVH; however, he sighed after being asked about SI, which prompted a verbal contract for safety to which he agreed. He describes his sleep as "good" and his appetite as "not so hot". Pt expresses feelings of anxiety and depression; he rates both 6/10. He states that is is "anxious about the outcome", but he is unable to verbalize the outcome to which he is referring. He also reports that his depression is due to anxiety and states that he has "a kind of numbness". Pt observed expressing "thought blocking" during assessment. His blood pressure is elevated but he denies headache and dizziness. Pt reports that he had ECT yesterday and feels like it "wore off" this morning. No acute distress noted.  Problem: Education: Goal: Knowledge of the prescribed therapeutic regimen will improve Outcome: Progressing   Problem: Activity: Goal: Imbalance in normal sleep/wake cycle will improve Outcome: Progressing   Problem: Self-Concept: Goal: Level of anxiety will decrease Outcome: Not Progressing

## 2022-04-01 NOTE — Transfer of Care (Signed)
Immediate Anesthesia Transfer of Care Note  Patient: John [name]  Procedure(s) Performed: ECT TX  Patient Location: PACU  Anesthesia Type:General  Level of Consciousness: awake, alert  and oriented  Airway & Oxygen Therapy: Patient Spontanous Breathing  Post-op Assessment: Report given to RN and Post -op Vital signs reviewed and stable  Post vital signs: Reviewed and stable  Last Vitals:  Vitals Value Taken Time  BP 159/98 04/01/22 1315  Temp    Pulse 50 04/01/22 1327  Resp 21 04/01/22 1327  SpO2 99 % 04/01/22 1327  Vitals shown include unvalidated device data.  Last Pain:  Vitals:   04/01/22 1038  TempSrc:   PainSc: 0-No pain         Complications: No notable events documented.

## 2022-04-01 NOTE — Assessment & Plan Note (Deleted)
Patient may have PAF.  Current heart rate 51-85. CHA2DS2-VASc equals 5.  Patient has history of epidural hematoma and subdural hematoma.  On metoprolol.

## 2022-04-01 NOTE — Assessment & Plan Note (Addendum)
Blood pressure better controlled on Norvasc, losartan and metoprolol.

## 2022-04-01 NOTE — H&P (Signed)
John [name] is an 81 y.o. male.   Chief Complaint: Patient continues to be very anxious regularly.  No specific new medical issue HPI: Recurrent severe depression and anxiety  Past Medical History:  Diagnosis Date   Cancer ([place])    Depression    Depression    Diabetes mellitus without complication ([place])    type 2   Dissection of carotid artery ([place]) 08/31/2015   Eczema    Elevated PSA    H/O Osgood-Schlatter disease    Hand deformity, congenital    Hearing loss bilateral   Hemorrhoids    Horner syndrome    Hypertension    Migraine    Obesity    PONV (postoperative nausea and vomiting)    Prostate cancer ([place]) 2020   Rosacea    Sleep apnea     Past Surgical History:  Procedure Laterality Date   ANKLE ARTHROSCOPY Left    fracture   CHOLECYSTECTOMY  2007   CRANIOTOMY  1958   left frontal craniotomy for epidural hematoma   INSERTION OF MESH  01/02/2022   Procedure: INSERTION OF MESH;  Surgeon: Herbert Pun, MD;  Location: [facility] ORS;  Service: General;;   LIGAMENT REPAIR Right    thumb   RETINAL DETACHMENT SURGERY     TREATMENT FISTULA ANAL  2006    Family History  Problem Relation Age of Onset   Osteoporosis Mother    Depression Mother    Prostate cancer Father    Hypertension Father    Diabetes Brother    Kidney disease Neg Hx    Social History:  reports that he has never smoked. He has never used smokeless tobacco. He reports that he does not currently use alcohol. He reports that he does not use drugs.  Allergies: No Known Allergies  Facility-Administered Medications Prior to Admission  Medication Dose Route Frequency Provider Last Rate Last Admin   gentamicin (GARAMYCIN) injection 80 mg  80 mg Intramuscular Once Hollice Espy, MD       levofloxacin [facility]) tablet 500 mg  500 mg Oral Once Hollice Espy, MD       Medications Prior to Admission  Medication Sig Dispense Refill   aspirin EC 81 MG tablet Take 81 mg by mouth daily.     atenolol  (TENORMIN) 50 MG tablet Take 50 mg by mouth daily.  0   buPROPion ER (WELLBUTRIN SR) 100 MG 12 hr tablet Take 100 mg by mouth 2 (two) times daily.     glipiZIDE (GLUCOTROL) 5 MG tablet Take 10 mg by mouth 2 (two) times daily before a meal.     oxyCODONE-acetaminophen (PERCOCET) 5-325 MG tablet Take 1 tablet by mouth every 4 (four) hours as needed for severe pain. (Patient not taking: Reported on 03/12/2022) 20 tablet 0   SUMAtriptan (IMITREX) 100 MG tablet TAKE 1 TABLET BY MOUTH ONCE AS NEEDED FOR MIGRAINE. MAY REPEAT 2ND DOSE AFTER 2 HOUR IF NEEDED     tamsulosin (FLOMAX) 0.4 MG CAPS capsule TAKE 1 CAPSULE (0.4 MG TOTAL) BY MOUTH DAILY AFTER SUPPER. 90 capsule 3   venlafaxine (EFFEXOR) 37.5 MG tablet Take 75 mg by mouth daily.      Results for orders placed or performed during the hospital encounter of 03/13/22 (from the past 48 hour(s))  Glucose, capillary     Status: Abnormal   Collection Time: 04/01/22  8:24 AM  Result Value Ref Range   Glucose-Capillary 147 (H) 70 - 99 mg/dL    Comment: Glucose reference range  applies only to samples taken after fasting for at least 8 hours.   No results found.  Review of Systems  Constitutional: Negative.   HENT: Negative.    Eyes: Negative.   Respiratory: Negative.    Cardiovascular: Negative.   Gastrointestinal: Negative.   Musculoskeletal: Negative.   Skin: Negative.   Neurological: Negative.   Psychiatric/Behavioral:  Positive for dysphoric mood. Negative for suicidal ideas.    Blood pressure (!) 161/106, pulse 68, temperature 98 F (36.7 C), temperature source Oral, resp. rate 16, height '6\' 2"'$  (1.88 m), weight 100 kg, SpO2 100 %. Physical Exam   Assessment/Plan Appears to be showing improvement and tolerating treatment well.  We will discuss the possibility of switching to outpatient today after treatment.  If he has a good outpatient plan and safe transport we could discharge as early as tomorrow.  Otherwise continue overall treatment  plan  [name], MD 04/01/2022, 11:30 AM

## 2022-04-01 NOTE — Consult Note (Signed)
Cardiology Consult    Patient ID: John [name] MRN: [id number], DOB/AGE: [birth year]   Admit date: 03/13/2022 Date of Consult: 04/01/2022  Primary Physician: Juluis Pitch, MD Primary Cardiologist: Kathlyn Sacramento, MD Requesting Provider: Jeri Modena, MD  Patient Profile    John [name] is a 81 y.o. 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 Weber Cooks.  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 06/2015 CT Head: abnl thickening of mid-dist R cervical ICA w/o narrowing - ? vasculitis and thrombosed/healing dissection; b. 01/2021 Carotid U/S: 1-39% bilat ICA stenoses.   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 Age 12 - struck in head w/ baseball --> s/p craniotomy.   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 Approx 2015 - eval in [place] - conservatively managed.    Past Surgical History:  Procedure Laterality Date   ANKLE ARTHROSCOPY Left    fracture   CHOLECYSTECTOMY  2007   [place]   left frontal craniotomy for epidural hematoma   INSERTION OF MESH  01/02/2022   Procedure: INSERTION OF MESH;  Surgeon: Herbert Pun, MD;  Location: [facility] ORS;  Service: General;;   LIGAMENT REPAIR Right    thumb   RETINAL DETACHMENT SURGERY     TREATMENT FISTULA ANAL  2006      Allergies  No Known Allergies  History of Present Illness    81 y.o. 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  As noted,  he has a h/o traumatic epidural hematoma after being struck by a baseball at age 44.  He underwent craniotomy at that time.  He has suffered from anxiety and depression since his teen years.  Approx 8-10 years ago, he suffered a spontaneous subdural hematoma, which was found by MRI in [place].  He says that he was conservatively managed.  In 2016, in the setting of eye droop, he underwent CT of the head, which showed abnormal wall thickening involving the mid to distal right cervical ICA without luminal narrowing suggestive of vasculitis and thrombosis/healing dissection.  He was placed on aspirin and Plavix for a year and has since been on aspirin.  He has subsequently been evaluated in the outpatient setting by vascular surgery with follow-up carotid ultrasounds, the most recent showing 1 to 39% bilateral internal carotid artery stenosis in April 2022.  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  In the setting of progressive anxiety and depression, patient was admitted to behavioral health in mid May 2023.  He has been treated by the behavioral health team and initiated on ECT earlier during this admission.  Today, prior to ECT, he was noted to have atrial fibrillation on the monitor.  The procedure was canceled we have been contacted.  John [name] is asymptomatic and denies any prior known history of arrhythmias.  He denies any prior history of chest pain, dyspnea, palpitations, or heart failure symptoms.  Nursing staff in the PACU indicate that on prior presentations for ECT during  this admission, they noted paroxysmal atrial fibrillation as well.  During my interview today, he converts to sinus rhythm and then back to A-fib a few minutes later.  He was asymptomatic regardless of rhythm.  Inpatient Medications    None reported  Family History    Family History  Problem Relation Age of Onset   Osteoporosis Mother    Depression Mother    Prostate cancer Father    Hypertension Father    Diabetes Brother    Kidney disease Neg Hx    He indicated that his mother is deceased. He indicated that his father is deceased. He indicated that his brother is alive. He indicated that the status of his neg hx is unknown.   Social History    Social History   Socioeconomic History   Marital status: Married    Spouse name: [name]   Number of children: 2   Years of education: Not on file   Highest education level: Not on file  Occupational History   Not on file  Tobacco Use   Smoking status: Never   Smokeless tobacco: Never  Vaping Use   Vaping Use: Never used  Substance and Sexual Activity   Alcohol use: Not Currently   Drug use: No   Sexual activity: Yes    Birth control/protection: None  Other Topics Concern   Not on file  Social History Narrative   Live at home with wife.    Social Determinants of Health   Financial Resource Strain: Not on file  Food Insecurity: Not on file  Transportation Needs: Not on file  Physical Activity: Not on file  Stress: Not on file  Social Connections: Not on file  Intimate Partner Violence: Not on file     Review of Systems    General:  No chills, fever, night sweats or weight changes.  Cardiovascular:  No chest pain, dyspnea on exertion, edema, orthopnea, palpitations, paroxysmal nocturnal dyspnea. Dermatological: No rash, lesions/masses Respiratory: No cough, dyspnea Urologic: No hematuria, dysuria Abdominal:   No nausea, vomiting, diarrhea, bright red blood per rectum, melena, or hematemesis Neurologic:  No visual  changes, wkns, changes in mental status. Psych:  +++ Anxiety and depression. All other systems reviewed and are otherwise negative except as noted above.  Physical Exam    Blood pressure (!) 122/107, pulse 74, temperature 98 F (36.7 C), resp. rate 14, height '6\' 2"'$  (1.88 m), weight 100 kg, SpO2 99 %.  General: Pleasant, NAD Psych: Flat affect.  Difficulty with word finding. Neuro: Alert and oriented X 3. Moves all extremities spontaneously. HEENT: Normal  Neck: Supple without bruits or JVD. Lungs:  Resp regular and unlabored, CTA. Heart: Irregularly irregular, no s3, s4, or murmurs. Abdomen: Obese, soft, non-tender, non-distended, BS + x 4.  Extremities: No clubbing, cyanosis or edema. DP/PT2+, Radials 2+ and equal bilaterally.  Labs    Lab  Results  Component Value Date   WBC 6.6 03/12/2022   HGB 17.3 (H) 03/12/2022   HCT 52.0 03/12/2022   MCV 95.9 03/12/2022   PLT 199 03/12/2022    Recent Labs  Lab 03/26/22 1226  NA 135  K 4.3  CL 100  CO2 27  BUN 15  CREATININE 0.83  CALCIUM 9.6  PROT 7.1  BILITOT 1.6*  ALKPHOS 66  ALT 32  AST 30  GLUCOSE 133*   Lab Results  Component Value Date   CHOL 131 03/19/2022   HDL 33 (L) 03/19/2022   LDLCALC 80 03/19/2022   TRIG 92 03/19/2022    Radiology Studies    CT HEAD WO CONTRAST (5MM)  Result Date: 03/25/2022 CLINICAL DATA:  Head trauma, minor (Age >= 65y).  Fall. EXAM: CT HEAD WITHOUT CONTRAST TECHNIQUE: Contiguous axial images were obtained from the base of the skull through the vertex without intravenous contrast. RADIATION DOSE REDUCTION: This exam was performed according to the departmental dose-optimization program which includes automated exposure control, adjustment of the mA and/or kV according to patient size and/or use of iterative reconstruction technique. COMPARISON:  07/20/2015 FINDINGS: Brain: No acute intracranial abnormality. Specifically, no hemorrhage, hydrocephalus, mass lesion, acute infarction, or  significant intracranial injury. Vascular: No hyperdense vessel or unexpected calcification. Skull: No acute calvarial abnormality. Postoperative changes in the left frontal bone, unchanged. Sinuses/Orbits: No acute findings Other: None IMPRESSION: No acute intracranial abnormality. Electronically Signed   By: Rolm Baptise M.D.   On: 03/25/2022 02:10    ECG & Cardiac Imaging    Afib, 82, LAD, nonspecific T changes - personally reviewed.  Assessment & Plan    1.  Paroxysmal atrial fibrillation: Called to see patient today secondary to asymptomatic, rate controlled atrial fibrillation noted on monitoring in the PACU.  PACU nursing staff indicates that he has had atrial fibrillation on other trips to the PACU this admission.  Patient is asymptomatic.  CHA2DS2-VASc equals 5 however, is a prior history of traumatic epidural bleed requiring craniotomy at the age of 38, more recently, he reports a spontaneous subdural hematoma occurring in [place] within the past 8 to 10 years.  He was previously evaluated in 2016 secondary to right internal carotid artery dissection, noted on CT performed in the setting of ptosis.  He was treated with aspirin and Plavix for a year and has been on aspirin since.  Follow-up carotid ultrasounds have not shown any significant disease (1 to 39% bilateral ICA stenoses-last April 2022).  In setting of vasculitis with spontaneous subdural hematoma in 2016, we will forego oral anticoagulation at this time.  Plan on outpatient follow-up with electrophysiology for consideration of [place] (as noted previously tolerated aspirin/Plavix).  Continue beta-blocker therapy for rate control.  Follow-up basic metabolic panel, magnesium, TSH, and 2D echocardiogram.  2.  Anxiety/depression: Per psychiatry.  3.  Essential hypertension: Blood pressure elevated in the PACU.  Continue beta-blocker and follow.  4.  Carotid arterial disease: See #1.  1 to 39% bilateral ICA stenoses on ultrasound  in 2022.  Not currently on statin.  LDL of 80 May 23.  Consider low-dose statin.  5.  Type 2 diabetes mellitus: On glipizide at home.  A1c was 5.6 last month.  Recommend low-dose statin.  Risk Assessment/Risk Scores:          CHA2DS2-VASc Score = 5   This indicates a 7.2% annual risk of stroke. The patient's score is based upon: CHF History: 0 HTN History: 1 Diabetes History: 1  Stroke History: 0 Vascular Disease History: 1 Age Score: 2 Gender Score: 0     Signed, Murray Hodgkins, NP 04/01/2022, 3:38 PM  For questions or updates, please contact   Please consult [email or web address] for contact info under Cardiology/STEMI.

## 2022-04-01 NOTE — [facility] Group Notes (Signed)
10:30 AM - 11:10 AM  Group: [facility]  PT was off the unit- ECT appt -not able to participate

## 2022-04-01 NOTE — Assessment & Plan Note (Deleted)
Recent A1c 5.6.  Well-controlled.  Can likely discontinue glipizide.

## 2022-04-01 NOTE — Assessment & Plan Note (Addendum)
Patient better getting his words out today than yesterday.  Faster with his responses.  ECT will be restarted tomorrow.  Potential discharge to the psychiatric unit tomorrow.

## 2022-04-01 NOTE — Plan of Care (Addendum)
Patient presents Anxious and Flat.  Patient denies SI/HI/AV/H and contracts for safety.  Pt rates his Anxiety 9/10 "I don't know why I woke up so Anxious".  Emotional support and encouragement given. Patient has delayed response and thought blocking when responding to assessment questions.  Patient scheduled for ECT Tx this am and may have some anxiety related to procedure. V/S WNL w/except bp 129/91 and Temp 97.3. bs 147. Q 15 minute safety checks in place.  Will continue plan of care.  Problem: Education: Goal: Knowledge of the prescribed therapeutic regimen will improve 04/01/2022 0811 by Theodosia Quay, RN Outcome: Progressing 03/31/2022 1811 by Theodosia Quay, RN Outcome: Progressing   Problem: Activity: Goal: Imbalance in normal sleep/wake cycle will improve 04/01/2022 0811 by Theodosia Quay, RN Outcome: Progressing 03/31/2022 1811 by Theodosia Quay, RN Outcome: Progressing   Problem: Education: Goal: Utilization of techniques to improve thought processes will improve 04/01/2022 0811 by Theodosia Quay, RN Outcome: Not Progressing 03/31/2022 1811 by Theodosia Quay, RN Outcome: Progressing   Problem: Activity: Goal: Interest or engagement in leisure activities will improve Outcome: Not Progressing

## 2022-04-01 NOTE — Assessment & Plan Note (Deleted)
-  started lipitor

## 2022-04-01 NOTE — Discharge Summary (Signed)
Physician Discharge Summary Note  Patient:  John [name] is an 81 y.o., male MRN:  [id number] DOB:  [birth year] Patient phone:  [phone] (home)  Patient address:   [address] [place] [address],  Total Time spent with patient: 30 minutes  Date of Admission:  03/13/2022 Date of Discharge: 04/01/2022  Reason for Admission: Patient was admitted to the hospital after presenting with a worsening of depression with anhedonia and poor sleep and loss of interest loss of energy.  Principal Problem: MDD (major depressive disorder) Discharge Diagnoses: Principal Problem:   MDD (major depressive disorder)   Past Psychiatric History: Lifelong history of severe recurrent depression  Past Medical History:  Past Medical History:  Diagnosis Date   Cancer ([place])    Depression    Depression    Diabetes mellitus without complication ([place])    type 2   Dissection of carotid artery ([place]) 08/31/2015   Eczema    Elevated PSA    H/O Osgood-Schlatter disease    Hand deformity, congenital    Hearing loss bilateral   Hemorrhoids    Horner syndrome    Hypertension    Migraine    Obesity    PONV (postoperative nausea and vomiting)    Prostate cancer ([place]) 2020   Rosacea    Sleep apnea     Past Surgical History:  Procedure Laterality Date   ANKLE ARTHROSCOPY Left    fracture   CHOLECYSTECTOMY  2007   CRANIOTOMY  1958   left frontal craniotomy for epidural hematoma   INSERTION OF MESH  01/02/2022   Procedure: INSERTION OF MESH;  Surgeon: Herbert Pun, MD;  Location: [facility] ORS;  Service: General;;   LIGAMENT REPAIR Right    thumb   RETINAL DETACHMENT SURGERY     TREATMENT FISTULA ANAL  2006   Family History:  Family History  Problem Relation Age of Onset   Osteoporosis Mother    Depression Mother    Prostate cancer Father    Hypertension Father    Diabetes Brother    Kidney disease Neg Hx    Family Psychiatric  History: See previous Social History:  Social History    Substance and Sexual Activity  Alcohol Use Not Currently     Social History   Substance and Sexual Activity  Drug Use No    Social History   Socioeconomic History   Marital status: Married    Spouse name: [name]   Number of children: 2   Years of education: Not on file   Highest education level: Not on file  Occupational History   Not on file  Tobacco Use   Smoking status: Never   Smokeless tobacco: Never  Vaping Use   Vaping Use: Never used  Substance and Sexual Activity   Alcohol use: Not Currently   Drug use: No   Sexual activity: Yes    Birth control/protection: None  Other Topics Concern   Not on file  Social History Narrative   Live at home with wife.    Social Determinants of Health   Financial Resource Strain: Not on file  Food Insecurity: Not on file  Transportation Needs: Not on file  Physical Activity: Not on file  Stress: Not on file  Social Connections: Not on file    Hospital Course: Patient was admitted to the geriatric psychiatry ward.  He was treated for depression with some medication changes.  Medical illnesses were addressed and stabilized.  Patient showed only partial response to treatment for depression  and after an extended hospitalization consultation was requested for electroconvulsive therapy.  Patient was thought to be an appropriate candidate and ECT was started last week.  He had 2 right unilateral treatments last week both of which were tolerated well.  Today prior to treatment he was noted to be less energetic and seemed almost confused.  He seemed to have an abnormal heart rhythm.  We canceled ECT treatment today and got some regular twelve-lead EKGs.  I got a consult from cardiology who felt that they likely represented atrial fibrillation.  After speaking with cardiology and with the hospitalist we have elected to recommend transfer to the medical service for work-up and evaluation.  Psychiatric service will continue to follow up as  needed.  Physical Findings: AIMS:  , ,  ,  ,    CIWA:    COWS:     Musculoskeletal: Strength & Muscle Tone: within normal limits Gait & Station: normal Patient leans: N/A   Psychiatric Specialty Exam:  Presentation  General Appearance: Appropriate for Environment  Eye Contact:Good  Speech:Clear and Coherent  Speech Volume:Normal  Handedness:No data recorded  Mood and Affect  Mood:Depressed  Affect:Congruent   Thought Process  Thought Processes:Coherent  Descriptions of Associations:Intact  Orientation:Full (Time, Place and Person)  Thought Content:Logical  History of Schizophrenia/Schizoaffective disorder:No  Duration of Psychotic Symptoms:No data recorded Hallucinations:No data recorded Ideas of Reference:None  Suicidal Thoughts:No data recorded Homicidal Thoughts:No data recorded  Sensorium  Memory:Immediate Good; Recent Good; Remote Good  Judgment:No data recorded Insight:Good   Executive Functions  Concentration:Good  Attention Span:Good  [place]  Language:Good   Psychomotor Activity  Psychomotor Activity:No data recorded  Assets  Assets:Desire for Improvement; Armed forces logistics/support/administrative officer; Financial Resources/Insurance; Housing; Intimacy; Social Support; Resilience; Physical Health   Sleep  Sleep:No data recorded   Physical Exam: Physical Exam Vitals and nursing note reviewed.  Constitutional:      Appearance: Normal appearance.  HENT:     Head: Normocephalic and atraumatic.     Mouth/Throat:     Pharynx: Oropharynx is clear.  Eyes:     Pupils: Pupils are equal, round, and reactive to light.  Cardiovascular:     Rate and Rhythm: Normal rate and regular rhythm.  Pulmonary:     Effort: Pulmonary effort is normal.     Breath sounds: Normal breath sounds.  Abdominal:     General: Abdomen is flat.     Palpations: Abdomen is soft.  Musculoskeletal:        General: Normal range of motion.  Skin:     General: Skin is warm and dry.  Neurological:     General: No focal deficit present.     Mental Status: He is alert. Mental status is at baseline.  Psychiatric:        Attention and Perception: He is inattentive.        Mood and Affect: Mood is anxious. Affect is blunt.        Speech: Speech is delayed.        Behavior: Behavior is withdrawn.        Thought Content: Thought content normal.        Cognition and Memory: Cognition is impaired.   Review of Systems  Constitutional: Negative.   HENT: Negative.    Eyes: Negative.   Respiratory: Negative.    Cardiovascular: Negative.   Gastrointestinal: Negative.   Musculoskeletal: Negative.   Skin: Negative.   Neurological: Negative.   Psychiatric/Behavioral:  Positive for depression  and memory loss. Negative for hallucinations, substance abuse and suicidal ideas. The patient is nervous/anxious and has insomnia.   Blood pressure (!) 182/111, pulse 75, temperature 98 F (36.7 C), resp. rate 15, height '6\' 2"'$  (1.88 m), weight 100 kg, SpO2 99 %. Body mass index is 28.31 kg/m.   Social History   Tobacco Use  Smoking Status Never  Smokeless Tobacco Never   Tobacco Cessation:  N/A, patient does not currently use tobacco products   Blood Alcohol level:  Lab Results  Component Value Date   ETH <10 61/95/0932    Metabolic Disorder Labs:  Lab Results  Component Value Date   HGBA1C 5.6 03/19/2022   MPG 114.02 03/19/2022   No results found for: PROLACTIN Lab Results  Component Value Date   CHOL 131 03/19/2022   TRIG 92 03/19/2022   HDL 33 (L) 03/19/2022   CHOLHDL 4.0 03/19/2022   VLDL 18 03/19/2022   LDLCALC 80 03/19/2022    See Psychiatric Specialty Exam and Suicide Risk Assessment completed by Attending Physician prior to discharge.  Discharge destination: Transfer to medical service  Is patient on multiple antipsychotic therapies at discharge:  No   Has Patient had three or more failed trials of antipsychotic  monotherapy by history:  No  Recommended Plan for Multiple Antipsychotic Therapies: NA  Discharge Instructions     Diet - low sodium heart healthy   Complete by: As directed    Increase activity slowly   Complete by: As directed       Allergies as of 04/01/2022   No Known Allergies      Medication List     STOP taking these medications    atenolol 50 MG tablet Commonly known as: TENORMIN   buPROPion ER 100 MG 12 hr tablet Commonly known as: WELLBUTRIN SR   glipiZIDE 5 MG tablet Commonly known as: GLUCOTROL   oxyCODONE-acetaminophen 5-325 MG tablet Commonly known as: Percocet   SUMAtriptan 100 MG tablet Commonly known as: IMITREX   tamsulosin 0.4 MG Caps capsule Commonly known as: FLOMAX   venlafaxine 37.5 MG tablet Commonly known as: EFFEXOR       TAKE these medications      Indication  aspirin EC 81 MG tablet Take 81 mg by mouth daily.         Follow-up Information     [facility] Follow up on 04/18/2022.   Specialty: Behavioral Health Why: You have an appointment scheduled on Thursday June 22nd at 2pm with Dr. Modesta Messing. Please arrive at 1:45pm with insurance information. Please contact office if you need to reschedule. Thanks! Contact information: [place] [place] [address] [phone]                Follow-up recommendations: Activity and treatment plan by recommendations of medical service  Comments: Please order a psychiatric consult so that he is on her list and we will continue to follow up and can consider transfer back once things are stabilized  Signed: [name], MD 04/01/2022, 1:55 PM

## 2022-04-02 ENCOUNTER — Encounter: Payer: Self-pay | Admitting: Obstetrics and Gynecology

## 2022-04-02 ENCOUNTER — Observation Stay (HOSPITAL_COMMUNITY)
Admission: AD | Admit: 2022-04-02 | Discharge: 2022-04-02 | Disposition: A | Discharge status: Home / Self Care | Payer: Medicare Other | Source: Ambulatory Visit | Attending: Nurse Practitioner | Admitting: Nurse Practitioner

## 2022-04-02 ENCOUNTER — Other Ambulatory Visit: Payer: Self-pay

## 2022-04-02 DIAGNOSIS — Z818 Family history of other mental and behavioral disorders: Secondary | ICD-10-CM | POA: Diagnosis not present

## 2022-04-02 DIAGNOSIS — Z20822 Contact with and (suspected) exposure to covid-19: Secondary | ICD-10-CM | POA: Diagnosis present

## 2022-04-02 DIAGNOSIS — F332 Major depressive disorder, recurrent severe without psychotic features: Secondary | ICD-10-CM | POA: Diagnosis not present

## 2022-04-02 DIAGNOSIS — Z8042 Family history of malignant neoplasm of prostate: Secondary | ICD-10-CM | POA: Diagnosis not present

## 2022-04-02 DIAGNOSIS — I493 Ventricular premature depolarization: Secondary | ICD-10-CM | POA: Diagnosis present

## 2022-04-02 DIAGNOSIS — F419 Anxiety disorder, unspecified: Secondary | ICD-10-CM | POA: Diagnosis present

## 2022-04-02 DIAGNOSIS — E669 Obesity, unspecified: Secondary | ICD-10-CM | POA: Diagnosis present

## 2022-04-02 DIAGNOSIS — Z8249 Family history of ischemic heart disease and other diseases of the circulatory system: Secondary | ICD-10-CM | POA: Diagnosis not present

## 2022-04-02 DIAGNOSIS — Z833 Family history of diabetes mellitus: Secondary | ICD-10-CM | POA: Diagnosis not present

## 2022-04-02 DIAGNOSIS — F322 Major depressive disorder, single episode, severe without psychotic features: Secondary | ICD-10-CM | POA: Diagnosis present

## 2022-04-02 DIAGNOSIS — F418 Other specified anxiety disorders: Secondary | ICD-10-CM | POA: Diagnosis not present

## 2022-04-02 DIAGNOSIS — I10 Essential (primary) hypertension: Secondary | ICD-10-CM | POA: Diagnosis present

## 2022-04-02 DIAGNOSIS — I480 Paroxysmal atrial fibrillation: Secondary | ICD-10-CM | POA: Diagnosis present

## 2022-04-02 DIAGNOSIS — I4891 Unspecified atrial fibrillation: Secondary | ICD-10-CM

## 2022-04-02 DIAGNOSIS — G902 Horner's syndrome: Secondary | ICD-10-CM | POA: Diagnosis present

## 2022-04-02 DIAGNOSIS — D751 Secondary polycythemia: Secondary | ICD-10-CM | POA: Diagnosis present

## 2022-04-02 DIAGNOSIS — E1169 Type 2 diabetes mellitus with other specified complication: Secondary | ICD-10-CM | POA: Diagnosis present

## 2022-04-02 DIAGNOSIS — Z7982 Long term (current) use of aspirin: Secondary | ICD-10-CM | POA: Diagnosis not present

## 2022-04-02 DIAGNOSIS — G4733 Obstructive sleep apnea (adult) (pediatric): Secondary | ICD-10-CM | POA: Diagnosis present

## 2022-04-02 DIAGNOSIS — Z8546 Personal history of malignant neoplasm of prostate: Secondary | ICD-10-CM | POA: Diagnosis not present

## 2022-04-02 DIAGNOSIS — I77819 Aortic ectasia, unspecified site: Secondary | ICD-10-CM | POA: Diagnosis present

## 2022-04-02 DIAGNOSIS — I440 Atrioventricular block, first degree: Secondary | ICD-10-CM | POA: Diagnosis present

## 2022-04-02 DIAGNOSIS — G40909 Epilepsy, unspecified, not intractable, without status epilepticus: Secondary | ICD-10-CM | POA: Diagnosis present

## 2022-04-02 DIAGNOSIS — Z8262 Family history of osteoporosis: Secondary | ICD-10-CM | POA: Diagnosis not present

## 2022-04-02 DIAGNOSIS — Z9049 Acquired absence of other specified parts of digestive tract: Secondary | ICD-10-CM | POA: Diagnosis not present

## 2022-04-02 DIAGNOSIS — E785 Hyperlipidemia, unspecified: Secondary | ICD-10-CM | POA: Diagnosis present

## 2022-04-02 DIAGNOSIS — Z6828 Body mass index (BMI) 28.0-28.9, adult: Secondary | ICD-10-CM | POA: Diagnosis not present

## 2022-04-02 LAB — ECHOCARDIOGRAM COMPLETE
AR max vel: 3.55 cm2
AV Area VTI: 3.68 cm2
AV Area mean vel: 3.78 cm2
AV Mean grad: 3 mmHg
AV Peak grad: 5.7 mmHg
Ao pk vel: 1.19 m/s
Area-P 1/2: 3.01 cm2
Height: 74 in
MV VTI: 3.75 cm2
S' Lateral: 3.02 cm
Weight: 3527.36 oz

## 2022-04-02 LAB — GLUCOSE, CAPILLARY
Glucose-Capillary: 123 mg/dL — ABNORMAL HIGH (ref 70–99)
Glucose-Capillary: 176 mg/dL — ABNORMAL HIGH (ref 70–99)
Glucose-Capillary: 141 mg/dL — ABNORMAL HIGH (ref 70–99)
Glucose-Capillary: 101 mg/dL — ABNORMAL HIGH (ref 70–99)

## 2022-04-02 LAB — BASIC METABOLIC PANEL
Anion gap: 11 (ref 5–15)
BUN: 19 mg/dL (ref 8–23)
CO2: 21 mmol/L — ABNORMAL LOW (ref 22–32)
Calcium: 9.4 mg/dL (ref 8.9–10.3)
Chloride: 107 mmol/L (ref 98–111)
Creatinine, Ser: 0.84 mg/dL (ref 0.61–1.24)
GFR, Estimated: >60 mL/min (ref >60)
Glucose, Bld: 110 mg/dL — ABNORMAL HIGH (ref 70–99)
Potassium: 3.6 mmol/L (ref 3.5–5.1)
Sodium: 139 mmol/L (ref 135–145)

## 2022-04-02 LAB — CK: Total CK: 134 U/L (ref 49–397)

## 2022-04-02 MED ORDER — METOPROLOL TARTRATE 25 MG PO TABS
25.0000 mg | ORAL_TABLET | Freq: Two times a day (BID) | ORAL | Status: DC
  Administered 2022-04-02 – 2022-04-04 (×5): 25 mg via ORAL
  Filled 2022-04-02 (×5): qty 1

## 2022-04-02 MED ORDER — POTASSIUM CHLORIDE CRYS ER 20 MEQ PO TBCR
40.0000 meq | EXTENDED_RELEASE_TABLET | Freq: Once | ORAL | Status: AC
Start: 2022-04-02 — End: 2022-04-02
  Administered 2022-04-02: 40 meq via ORAL
  Filled 2022-04-02: qty 2

## 2022-04-02 MED ORDER — LOSARTAN POTASSIUM 50 MG PO TABS
100.0000 mg | ORAL_TABLET | Freq: Every day | ORAL | Status: DC
  Administered 2022-04-03 – 2022-04-04 (×2): 100 mg via ORAL
  Filled 2022-04-02 (×2): qty 2

## 2022-04-02 MED ORDER — ASPIRIN 81 MG PO TBEC
81.0000 mg | DELAYED_RELEASE_TABLET | Freq: Every day | ORAL | Status: DC
  Administered 2022-04-02 – 2022-04-04 (×3): 81 mg via ORAL
  Filled 2022-04-02 (×3): qty 1

## 2022-04-02 MED ORDER — LOSARTAN POTASSIUM 50 MG PO TABS
50.0000 mg | ORAL_TABLET | Freq: Once | ORAL | Status: AC
  Administered 2022-04-02: 50 mg via ORAL
  Filled 2022-04-02: qty 1

## 2022-04-02 NOTE — Progress Notes (Signed)
Mobility Specialist - Progress Note   04/02/22 1140  Mobility  Activity Ambulated independently in hallway;Stood at bedside;Dangled on edge of bed  Level of Assistance Independent  Assistive Device Front wheel walker  Distance Ambulated (ft) 30 ft  Activity Response Tolerated well  $Mobility charge 1 Mobility     During mobility: 94 HR  Pf sitting EOB upon arrival using RA. Completes all activities indep --- oriented but flat effect throughout. Ambulates 51f out the room and insists on turning around. Denies pain or dizziness and when asked for a specific reason for terminating ambulation, pt shakes his head. RN notified. Pt left EOB.  MMerrily BrittleMobility Specialist 04/02/22, 11:44 AM

## 2022-04-02 NOTE — Consult Note (Signed)
[facility] Consult   Reason for Consult: Follow-up consult for this 81 year old man was transferred to medicine yesterday. 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 male patient admitted with "I feel bad".  HPI: Patient seen and chart reviewed.  The patient was transferred to the medical service yesterday afternoon.  He had been taken to postop prior to scheduled ECT treatment but before receiving ECT it was noted that he seemed to have a heart arrhythmia that was different than baseline.  ECT was canceled and he was assessed by cardiology and recommended admission to medicine.  Cardiology has now cleared him from the standpoint of needing acute cardiology admission.  On assessment today the patient shows the altered mental status that we also were seeing yesterday.  He is only minimally responsive.  Have to ask questions several times and then only get whispered answers of 1 or 2 words.  Cannot describe his way that he is feeling.  When asked where he was said that he was at a police station.  Patient appears to be shaking all over with a very noticeable tremor especially in his jaw.  Just a quick examination he has stiffness in his upper extremities.  Nursing reports that he has been confused all day.  Reviewed medications.  There had not been any changes that I can find to his medications in the last couple days.  He is on Effexor 300 mg a day which had been increased many days ago earlier in his hospital stay but had been stable since then and appeared to  be well tolerated.  He had tolerated his first 2 ECT treatments without any significant complication and certainly no delirium like what he is having now.  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 06/2015 CT Head: abnl thickening of mid-dist R cervical ICA w/o narrowing - ? vasculitis and thrombosed/healing dissection; b. 01/2021 Carotid U/S: 1-39% bilat ICA stenoses.   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 Age 64 - struck in head w/ baseball --> s/p craniotomy.   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 Approx 2015 - eval in [place] - conservatively managed.    Past Surgical  History:  Procedure Laterality Date   ANKLE ARTHROSCOPY Left    fracture   CHOLECYSTECTOMY  2007   Belt   left frontal craniotomy for epidural hematoma   INSERTION OF MESH  01/02/2022   Procedure: INSERTION OF MESH;  Surgeon: Herbert Pun, MD;  Location: [facility] ORS;  Service: General;;   LIGAMENT REPAIR Right    thumb   RETINAL DETACHMENT SURGERY     TREATMENT FISTULA ANAL  2006   Family History:  Family History  Problem Relation Age of Onset   Osteoporosis Mother    Depression Mother    Prostate cancer Father    Hypertension Father    Diabetes Brother    Kidney disease Neg Hx    Family Psychiatric  History: See  previous Social History:  Social History   Substance and Sexual Activity  Alcohol Use Not Currently     Social History   Substance and Sexual Activity  Drug Use No    Social History   Socioeconomic History   Marital status: Married    Spouse name: [name]   Number of children: 2   Years of education: Not on file   Highest education level: Not on file  Occupational History   Not on file  Tobacco Use   Smoking status: Never   Smokeless tobacco: Never  Vaping Use   Vaping Use: Never used  Substance and Sexual Activity   Alcohol use: Not Currently   Drug use: No   Sexual activity: Yes    Birth control/protection: None  Other Topics Concern   Not on file  Social History Narrative   Live at home with wife.    Social Determinants of Health   Financial Resource Strain: Not on file  Food Insecurity: Not on file  Transportation Needs: Not on file  Physical Activity: Not on file  Stress: Not on file  Social Connections: Not on file   Additional Social History:    Allergies:  No Known Allergies  Labs:  Results for orders placed or performed during the hospital encounter of 04/01/22 (from the past 48 hour(s))  TSH     Status: None   Collection Time: 04/01/22  3:39 PM  Result Value Ref Range   TSH 0.766 0.350 - 4.500 uIU/mL    Comment: Performed by a 3rd Generation assay with a functional sensitivity of <=0.01 uIU/mL. Performed at [facility], [address] [place], [place] [address]   Magnesium     Status: None   Collection Time: 04/01/22  3:39 PM  Result Value Ref Range   Magnesium 2.1 1.7 - 2.4 mg/dL    Comment: Performed at [facility], [address] [place], [place] [address]  CBC     Status: Abnormal   Collection Time: 04/01/22  3:39 PM  Result Value Ref Range   WBC 5.8 4.0 - 10.5 K/uL   RBC 5.84 (H) 4.22 - 5.81 MIL/uL   Hemoglobin 18.1 (H) 13.0 - 17.0 g/dL   HCT 52.7 (H) 39.0 - 52.0 %   MCV 90.2 80.0 - 100.0 fL   MCH 31.0 26.0  - 34.0 pg   MCHC 34.3 30.0 - 36.0 g/dL   RDW 12.5 11.5 - 15.5 %   Platelets 178 150 - 400 K/uL   nRBC 0.0 0.0 - 0.2 %    Comment: Performed at [facility], [address], [place],  [address]  Basic metabolic panel     Status: Abnormal  Collection Time: 04/01/22  3:39 PM  Result Value Ref Range   Sodium 139 135 - 145 mmol/L   Potassium 3.5 3.5 - 5.1 mmol/L   Chloride 107 98 - 111 mmol/L   CO2 21 (L) 22 - 32 mmol/L   Glucose, Bld 129 (H) 70 - 99 mg/dL    Comment: Glucose reference range applies only to samples taken after fasting for at least 8 hours.   BUN 22 8 - 23 mg/dL   Creatinine, Ser 0.95 0.61 - 1.24 mg/dL   Calcium 9.4 8.9 - 10.3 mg/dL   GFR, Estimated >60 >60 mL/min    Comment: (NOTE) Calculated using the CKD-EPI Creatinine Equation (2021)    Anion gap 11 5 - 15    Comment: Performed at [facility], [address] [place], [place] [address]  Glucose, capillary     Status: Abnormal   Collection Time: 04/01/22  4:29 PM  Result Value Ref Range   Glucose-Capillary 171 (H) 70 - 99 mg/dL    Comment: Glucose reference range applies only to samples taken after fasting for at least 8 hours.  Glucose, capillary     Status: Abnormal   Collection Time: 04/01/22  8:55 PM  Result Value Ref Range   Glucose-Capillary 159 (H) 70 - 99 mg/dL    Comment: Glucose reference range applies only to samples taken after fasting for at least 8 hours.  Glucose, capillary     Status: Abnormal   Collection Time: 04/02/22  7:34 AM  Result Value Ref Range   Glucose-Capillary 123 (H) 70 - 99 mg/dL    Comment: Glucose reference range applies only to samples taken after fasting for at least 8 hours.  Glucose, capillary     Status: Abnormal   Collection Time: 04/02/22 11:39 AM  Result Value Ref Range   Glucose-Capillary 176 (H) 70 - 99 mg/dL    Comment: Glucose reference range applies only to samples taken after fasting for at least 8 hours.    Current  Facility-Administered Medications  Medication Dose Route Frequency Provider Last Rate Last Admin   acetaminophen (TYLENOL) tablet 650 mg  650 mg Oral Q6H PRN Ivor Costa, MD       atorvastatin (LIPITOR) tablet 10 mg  10 mg Oral Daily Theora Gianotti, NP   10 mg at 04/02/22 0803   enoxaparin (LOVENOX) injection 40 mg  40 mg Subcutaneous Q24H Ivor Costa, MD   40 mg at 04/01/22 2155   hydrALAZINE (APRESOLINE) injection 5 mg  5 mg Intravenous Q2H PRN Ivor Costa, MD   5 mg at 04/02/22 0059   insulin aspart (novoLOG) injection 0-5 Units  0-5 Units Subcutaneous QHS Ivor Costa, MD       insulin aspart (novoLOG) injection 0-9 Units  0-9 Units Subcutaneous TID WC Ivor Costa, MD   2 Units at 04/02/22 1207   LORazepam (ATIVAN) tablet 1 mg  1 mg Oral Q4H PRN Rachella Basden T, MD   1 mg at 04/02/22 1210   [START ON 04/03/2022] losartan (COZAAR) tablet 100 mg  100 mg Oral Daily Theora Gianotti, NP       metoprolol tartrate (LOPRESSOR) injection 2.5-5 mg  2.5-5 mg Intravenous Q2H PRN Ivor Costa, MD       ondansetron [facility]) injection 4 mg  4 mg Intravenous Q8H PRN Ivor Costa, MD        Musculoskeletal: Strength & Muscle Tone: cogwheel and increased Gait & Station: ataxic Patient leans: N/A  Psychiatric Specialty Exam:  Presentation  General Appearance: Appropriate for Environment  Eye Contact:Good  Speech:Clear and Coherent  Speech Volume:Normal  Handedness:No data recorded  Mood and Affect  Mood:Depressed  Affect:Congruent   Thought Process  Thought Processes:Coherent  Descriptions of Associations:Intact  Orientation:Full (Time, Place and Person)  Thought Content:Logical  History of Schizophrenia/Schizoaffective disorder:No  Duration of Psychotic Symptoms:No data recorded Hallucinations:No data recorded Ideas of Reference:None  Suicidal Thoughts:No data recorded Homicidal Thoughts:No data recorded  Sensorium  Memory:Immediate Good; Recent  Good; Remote Good  Judgment:No data recorded Insight:Good   Executive Functions  Concentration:Good  Attention Span:Good  [place]  Language:Good   Psychomotor Activity  Psychomotor Activity:No data recorded  Assets  Assets:Desire for Improvement; Armed forces logistics/support/administrative officer; Financial Resources/Insurance; Housing; Intimacy; Social Support; Resilience; Physical Health   Sleep  Sleep:No data recorded  Physical Exam: Physical Exam Vitals and nursing note reviewed.  Constitutional:      Appearance: Normal appearance.  HENT:     Head: Normocephalic and atraumatic.     Mouth/Throat:     Pharynx: Oropharynx is clear.  Eyes:     Pupils: Pupils are equal, round, and reactive to light.  Cardiovascular:     Rate and Rhythm: Normal rate and regular rhythm.  Pulmonary:     Effort: Pulmonary effort is normal.     Breath sounds: Normal breath sounds.  Abdominal:     General: Abdomen is flat.     Palpations: Abdomen is soft.  Musculoskeletal:        General: Normal range of motion.     Comments: Multiple tremors noted particularly in his jaw.  Has significant stiffness in his upper extremity muscles.  Movements are very slow.  Skin:    General: Skin is warm and dry.  Neurological:     General: No focal deficit present.     Mental Status: He is alert. Mental status is at baseline.  Psychiatric:        Attention and Perception: He is inattentive.        Mood and Affect: Affect is blunt.        Speech: He is noncommunicative. Speech is delayed.        Behavior: Behavior is withdrawn.        Cognition and Memory: Cognition is impaired. Memory is impaired.   Review of Systems  Unable to perform ROS: Acuity of condition  Blood pressure (!) 175/111, pulse 91, temperature 98.1 F (36.7 C), temperature source Oral, resp. rate 14, SpO2 98 %. There is no height or weight on file to calculate BMI.  Treatment Plan Summary: Medication management and Plan  very puzzling change in his mental state that looks a lot like an organic delirium.  Serotonin syndrome would be 1 possibility.  Not an obvious one since there had not been an acute increase in his dosage but the hypertension and the tremulousness the confusion and the flushing would all be consistent.  Much rare would be neuroleptic malignant syndrome which typically is not caused by serotonin reuptake inhibitors although there are case reports.  I have discontinued all of the venlafaxine.  Reviewed medications to get rid of anything else that could be an offending drug.  Spoke with nurse and supported the idea of using as needed benzodiazepines for now.  Also would suggest that he probably could use a one-to-one in his confused condition to keep him from falling.  We hope to be able to transfer him back to the psychiatry  service but at this point it does not appear medically stable enough.  We will continue to follow.  Disposition:  See above  [name], MD 04/02/2022 12:47 PM

## 2022-04-02 NOTE — Progress Notes (Signed)
PROGRESS NOTE    John [name]  OHY:[id number] DOB: 02/17/1941 DOA: 04/01/2022 PCP: Juluis Pitch, MD     Brief Narrative:   Hospitalized for severe depression on geri psych unit. ECT initiated. New a-fib diagnosed with psychiatry noted irregular heart rate. 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 Chads2vasc of 5. HR is controlled. Has a history of spontaneous SDH 10 years ago. TSH wnl. TTE pending - cardiology following. Deferring anticoagulation for now given bleeding risk - also deferring rate/rhythm control for now - advising outpt f/u, consideration of Watchman device - f/u TTE  # HTN Here bp elevated to 170s. Asymptomatic - losartan increased to 100 - hydral prn  # major depressive disorder, severe Transferred to our service from [facility] psych service where he had begun receiving ECT.  - psych to see today, recs pending, but advise can't take patient back to their service today given his BP  # Seizure disorder Doesn't appear to be on meds - seizure precautions  # OSA Not on cpap  # T2DM Glucose controlled - SSI  # Hx carotid artery dissection - aspirin, lipitor   DVT prophylaxis: lovenox Code Status: full Family Communication: none at bedside. No answer when wife called  Level of care: Telemetry Cardiac Status is: Observation, will discuss with UR    Consultants:  Psychiatry, cardiology  Procedures: none  Antimicrobials:  none    Subjective: This morning says mental health is poor. No chest pain or palpitations  Objective: Vitals:   04/02/22 0036 04/02/22 0340 04/02/22 0732 04/02/22 1123  BP: (!) 174/97 (!) 166/95 (!)  174/101 (!) 175/111  Pulse: 76 70 75 91  Resp: '19 18 20 14  '$ Temp: 98.5 F (36.9 C) 98.1 F (36.7 C) 98.2 F (36.8 C) 98.1 F (36.7 C)  TempSrc: Oral Oral Oral Oral  SpO2: 97% 97% 98% 98%    Intake/Output Summary (Last 24 hours) at 04/02/2022 1243 Last data filed at 04/02/2022 1022 Gross per 24 hour  Intake 480 ml  Output 0 ml  Net 480 ml   There were no vitals filed for this visit.  Examination:  General exam: appears depressed Respiratory system: Clear to auscultation. Respiratory effort normal. Cardiovascular system: S1 & S2 heard, RRR. No JVD, murmurs, rubs, gallops or clicks. No pedal edema. Gastrointestinal system: Abdomen is nondistended, soft and nontender. No organomegaly or masses felt. Normal bowel sounds heard. Central nervous system: Alert and oriented. No focal neurological deficits. Extremities: Symmetric 5 x 5 power. Skin: No rashes, lesions or ulcers Psychiatry: monosylabilic answers, appears depressed    Data Reviewed: I have personally reviewed following labs and imaging studies  CBC: Recent Labs  Lab 04/01/22 1539  WBC 5.8  HGB 18.1*  HCT 52.7*  MCV 90.2  PLT 948   Basic Metabolic Panel: Recent Labs  Lab 04/01/22 1539  NA 139  K 3.5  CL 107  CO2 21*  GLUCOSE 129*  BUN 22  CREATININE 0.95  CALCIUM 9.4  MG 2.1   GFR: Estimated Creatinine Clearance: 79.6 mL/min (by C-G formula based on SCr of 0.95 mg/dL). Liver Function Tests: No results for input(s): AST, ALT, ALKPHOS, BILITOT, PROT, ALBUMIN in the  last 168 hours. No results for input(s): LIPASE, AMYLASE in the last 168 hours. No results for input(s): AMMONIA in the last 168 hours. Coagulation Profile: No results for input(s): INR, PROTIME in the last 168 hours. Cardiac Enzymes: No results for input(s): CKTOTAL, CKMB, CKMBINDEX, TROPONINI in the last 168 hours. BNP (last 3 results) No results for input(s): PROBNP in the last 8760 hours. HbA1C: No results for input(s): HGBA1C in the  last 72 hours. CBG: Recent Labs  Lab 04/01/22 1339 04/01/22 1629 04/01/22 2055 04/02/22 0734 04/02/22 1139  GLUCAP 140* 171* 159* 123* 176*   Lipid Profile: No results for input(s): CHOL, HDL, LDLCALC, TRIG, CHOLHDL, LDLDIRECT in the last 72 hours. Thyroid Function Tests: Recent Labs    04/01/22 1539  TSH 0.766   Anemia Panel: No results for input(s): VITAMINB12, FOLATE, FERRITIN, TIBC, IRON, RETICCTPCT in the last 72 hours. Urine analysis:    Component Value Date/Time   COLORURINE YELLOW (A) 11/09/2021 1706   APPEARANCEUR CLEAR (A) 11/09/2021 1706   APPEARANCEUR Clear 10/02/2021 1346   LABSPEC 1.028 11/09/2021 1706   PHURINE 5.0 11/09/2021 1706   GLUCOSEU >=500 (A) 11/09/2021 1706   HGBUR NEGATIVE 11/09/2021 1706   BILIRUBINUR NEGATIVE 11/09/2021 1706   BILIRUBINUR Negative 10/02/2021 1346   KETONESUR 5 (A) 11/09/2021 1706   PROTEINUR NEGATIVE 11/09/2021 1706   NITRITE NEGATIVE 11/09/2021 1706   LEUKOCYTESUR NEGATIVE 11/09/2021 1706   Sepsis Labs: '@LABRCNTIP'$ (procalcitonin:4,lacticidven:4)  )No results found for this or any previous visit (from the past 240 hour(s)).       Radiology Studies: No results found.      Scheduled Meds:  atorvastatin  10 mg Oral Daily   enoxaparin (LOVENOX) injection  40 mg Subcutaneous Q24H   insulin aspart  0-5 Units Subcutaneous QHS   insulin aspart  0-9 Units Subcutaneous TID WC   [START ON 04/03/2022] losartan  100 mg Oral Daily   Continuous Infusions:   LOS: 0 days     Desma Maxim, MD [facility]   If 7PM-7AM, please contact night-coverage [email or web address] Password [facility] 04/02/2022, 12:43 PM

## 2022-04-02 NOTE — Progress Notes (Signed)
Patient is A & O x4. Able to make his needs known but slow to response. He has been very flaccid in his demeanor. Not really interested in talking and stares at the wall. Denies any suicidal thoughts. Provider notified.

## 2022-04-02 NOTE — Plan of Care (Signed)

## 2022-04-02 NOTE — Progress Notes (Signed)
Patient seems more agitated. He sitting on side of bed and rocking. Asked him if I can help with something no response.  1230 Psych in room to evaluate the patient. Reported patients symptoms since morning to them.Recommended patient may need to have a sitter for safety.

## 2022-04-02 NOTE — Anesthesia Postprocedure Evaluation (Signed)
Anesthesia Post Note  Patient: John [name]  Procedure(s) Performed: ECT TX  Patient location during evaluation: PACU Anesthesia Type: General Level of consciousness: awake and alert Pain management: pain level controlled Vital Signs Assessment: post-procedure vital signs reviewed and stable Respiratory status: spontaneous breathing, nonlabored ventilation and respiratory function stable Cardiovascular status: blood pressure returned to baseline and stable Postop Assessment: no apparent nausea or vomiting Anesthetic complications: no   No notable events documented.   Last Vitals:  Vitals:   04/01/22 1345 04/01/22 1400  BP: (!) 182/111 (!) 122/107  Pulse: 75 74  Resp: 15 14  Temp:    SpO2: 99% 99%    Last Pain:  Vitals:   04/01/22 1330  TempSrc:   PainSc: 0-No pain                 [place] Ouch

## 2022-04-02 NOTE — Evaluation (Signed)
Physical Therapy Evaluation Patient Details Name: John [name] MRN: [id number] DOB: [birth year] Today's Date: 04/02/2022  History of Present Illness  John [name] is a 81 y.o. male with medical history significant of depression, anxiety, hypertension, diabetes mellitus, OSA, prostate cancer, Horner syndrome, dissection of carotid artery, subdural hematoma, epidural hematoma, who was found to have abnormal EKG with atrial fibrillation.    Clinical Impression  Pt received in Semi-Fowler's position and agreeable to therapy.  Pt is able to perform all bed mobility with use of bed rails, but physically does not need any assistance.  Pt then able to transfer and ambulate good distance with mild unsteadiness.  Pt with flat affect during treatment session with all responses.  Although pt is able to answer questions and give history of prior living situation and functional mobility, he displayed little to no emotion throughout evaluation.  Pt does ambulate with narrow BOS and will be kept on caseload to assist with mobility.  Current discharge plans to home without PT are appropriate at this time.  Pt will continue to benefit from skilled therapy in order to address deficits listed below.      Recommendations for follow up therapy are one component of a multi-disciplinary discharge planning process, led by the attending physician.  Recommendations may be updated based on patient status, additional functional criteria and insurance authorization.  Follow Up Recommendations No PT follow up    Assistance Recommended at Discharge None  Patient can return home with the following       Equipment Recommendations None recommended by PT  Recommendations for Other Services       Functional Status Assessment Patient has had a recent decline in their functional status and demonstrates the ability to make significant improvements in function in a reasonable and predictable amount of time.     Precautions /  Restrictions Restrictions Weight Bearing Restrictions: No      Mobility  Bed Mobility Overal bed mobility: Modified Independent                  Transfers Overall transfer level: Modified independent Equipment used: None                    Ambulation/Gait Ambulation/Gait assistance: Modified independent (Device/Increase time) Gait Distance (Feet): 220 Feet Assistive device: None Gait Pattern/deviations: WFL(Within Functional Limits) Gait velocity: decreased.     General Gait Details: Pt ambulates with good technique, mild unsteadiness, but overall performs well.  Stairs            Wheelchair Mobility    Modified Rankin (Stroke Patients Only)       Balance Overall balance assessment: Modified Independent                                           Pertinent Vitals/Pain Pain Assessment Pain Assessment: No/denies pain    Home Living Family/patient expects to be discharged to:: Private residence Living Arrangements: Spouse/significant other Available Help at Discharge: Family;Available 24 hours/day Type of Home: House Home Access: Stairs to enter Entrance Stairs-Rails: Can reach both Entrance Stairs-Number of Steps: 5   Home Layout: One level Home Equipment: None      Prior Function Prior Level of Function : Independent/Modified Independent             Mobility Comments: PT notes that he was able to perform cooking/cleaning prior to being  admitted to the hospital.       Hand Dominance   Dominant Hand: Right    Extremity/Trunk Assessment   Upper Extremity Assessment Upper Extremity Assessment: Overall WFL for tasks assessed    Lower Extremity Assessment Lower Extremity Assessment: Overall WFL for tasks assessed       Communication   Communication: No difficulties  Cognition Arousal/Alertness: Awake/alert Behavior During Therapy: WFL for tasks assessed/performed Overall Cognitive Status: Within  Functional Limits for tasks assessed                                          General Comments      Exercises     Assessment/Plan    PT Assessment Patient needs continued PT services  PT Problem List Decreased balance       PT Treatment Interventions Gait training;Therapeutic activities;Therapeutic exercise;Functional mobility training;Balance training    PT Goals (Current goals can be found in the Care Plan section)  Acute Rehab PT Goals Patient Stated Goal: to go home PT Goal Formulation: With patient Time For Goal Achievement: 04/16/22 Potential to Achieve Goals: Good    Frequency Min 2X/week     Co-evaluation               AM-PAC PT "6 Clicks" Mobility  Outcome Measure Help needed turning from your back to your side while in a flat bed without using bedrails?: None Help needed moving from lying on your back to sitting on the side of a flat bed without using bedrails?: None Help needed moving to and from a bed to a chair (including a wheelchair)?: None Help needed standing up from a chair using your arms (e.g., wheelchair or bedside chair)?: None Help needed to walk in hospital room?: A Little Help needed climbing 3-5 steps with a railing? : A Little 6 Click Score: 22    End of Session Equipment Utilized During Treatment: Gait belt Activity Tolerance: Patient tolerated treatment well Patient left: in bed;with call bell/phone within reach;with nursing/sitter in room Nurse Communication: Mobility status PT Visit Diagnosis: Unsteadiness on feet (R26.81)    Time: 4580-9983 PT Time Calculation (min) (ACUTE ONLY): 25 min   Charges:   PT Evaluation $PT Eval Low Complexity: 1 Low PT Treatments $Gait Training: 8-22 mins        Gwenlyn Saran, PT, DPT 04/02/22, 5:49 PM   Christie Nottingham 04/02/2022, 5:42 PM

## 2022-04-02 NOTE — Progress Notes (Signed)
Cardiology Progress Note   Patient Name: John [name] Date of Encounter: 04/02/2022  Primary Cardiologist: Kathlyn Sacramento, MD  Subjective   No chest pain, sob, or palpitations.  Maintaining sinus rhythm on tele.  Inpatient Medications    Scheduled Meds:  atorvastatin  10 mg Oral Daily   enoxaparin (LOVENOX) injection  40 mg Subcutaneous Q24H   insulin aspart  0-5 Units Subcutaneous QHS   insulin aspart  0-9 Units Subcutaneous TID WC   losartan  50 mg Oral Daily   venlafaxine XR  37.5 mg Oral Q breakfast   Continuous Infusions:  PRN Meds: acetaminophen, hydrALAZINE, LORazepam, metoprolol tartrate, ondansetron (ZOFRAN) IV   Vital Signs    Vitals:   04/01/22 1928 04/02/22 0036 04/02/22 0340 04/02/22 0732  BP: (!) 157/99 (!) 174/97 (!) 166/95 (!) 174/101  Pulse: 88 76 70 75  Resp: '19 19 18 20  '$ Temp: 98.3 F (36.8 C) 98.5 F (36.9 C) 98.1 F (36.7 C) 98.2 F (36.8 C)  TempSrc: Oral Oral Oral Oral  SpO2: 97% 97% 97% 98%    Intake/Output Summary (Last 24 hours) at 04/02/2022 1004 Last data filed at 04/01/2022 2100 Gross per 24 hour  Intake 240 ml  Output 0 ml  Net 240 ml   There were no vitals filed for this visit.  Physical Exam   GEN: Well nourished, well developed, in no acute distress.  HEENT: Grossly normal.  Neck: Supple, no JVD, carotid bruits, or masses. Cardiac: RRR, no murmurs, rubs, or gallops. No clubbing, cyanosis, edema.  Radials 2+, DP/PT 2+ and equal bilaterally.  Respiratory:  Respirations regular and unlabored, clear to auscultation bilaterally. GI: Soft, nontender, nondistended, BS + x 4. MS: no deformity or atrophy. Skin: warm and dry, no rash. Neuro:  Strength and sensation are intact. Psych: AAOx3.  Flat affect.  Labs    Chemistry Recent Labs  Lab 03/26/22 1226 04/01/22 1539  NA 135 139  K 4.3 3.5  CL 100 107  CO2 27 21*  GLUCOSE 133* 129*  BUN 15 22  CREATININE 0.83 0.95  CALCIUM 9.6 9.4  PROT 7.1  --   ALBUMIN 4.1  --    AST 30  --   ALT 32  --   ALKPHOS 66  --   BILITOT 1.6*  --   GFRNONAA >60 >60  ANIONGAP 8 11     Hematology Recent Labs  Lab 04/01/22 1539  WBC 5.8  RBC 5.84*  HGB 18.1*  HCT 52.7*  MCV 90.2  MCH 31.0  MCHC 34.3  RDW 12.5  PLT 178   Lipids  Lab Results  Component Value Date   CHOL 131 03/19/2022   HDL 33 (L) 03/19/2022   LDLCALC 80 03/19/2022   TRIG 92 03/19/2022   CHOLHDL 4.0 03/19/2022    HbA1c  Lab Results  Component Value Date   HGBA1C 5.6 03/19/2022   Lab Results  Component Value Date   TSH 0.766 04/01/2022     Radiology    No results found.  Telemetry    RSR - Personally Reviewed  Cardiac Studies   2D Echocardiogram pending  Patient Profile     81 y.o. male with a history of anxiety, depression, DMII, HTN, obesity, OSA, carotid dzs (1-39% bilat)/R ICA dissection (2016), traumatic epidural hematoma s/p craniotomy at age 9, spontaneous SDH ~ 10 yrs ago, and prostate cancer, who has been hospitalized @ [facility] since mid-May and was found to have PAF prior to ECT on 04/01/2022.  Assessment & Plan    1.  PAF:  Pt noted to have PAF on 6/5 prior to ECT.  Per PACU nsg staff, PAF noted on previous trips through the PACU during recent [facility] admission.  Currently maintaining sinus.  Pt asymptomatic when in afib.  TSH nl.  K 3.5.  Mg 2.1.  Echo pending.  Rate controlled when in fib (80's) in the absence of AVN blocking agents.  CHA2DS2VASc = 5  No OAC 2/2 h/o spontaneous SDH.  We will refer to EP as outpt for consideration of Watchman (pt prev tolerated asa/plavix x 1 yr in 2016).  Await echo.  If no significant findings, can likely go back to [facility] from cardiac standpoint.  2.  Essential HTN:  Pressures elevated.  Titrating losartan to 100 mg daily.  3.  DMII:  A1c 5.6.  On glipizide @ home.  Statin added here.  Signed, Murray Hodgkins, NP  04/02/2022, 10:04 AM    For questions or updates, please contact   Please consult [email or web address] for contact info under  Cardiology/STEMI.

## 2022-04-03 DIAGNOSIS — E1169 Type 2 diabetes mellitus with other specified complication: Secondary | ICD-10-CM

## 2022-04-03 DIAGNOSIS — I480 Paroxysmal atrial fibrillation: Secondary | ICD-10-CM

## 2022-04-03 DIAGNOSIS — E785 Hyperlipidemia, unspecified: Secondary | ICD-10-CM

## 2022-04-03 LAB — BASIC METABOLIC PANEL
Anion gap: 5 (ref 5–15)
BUN: 21 mg/dL (ref 8–23)
CO2: 28 mmol/L (ref 22–32)
Calcium: 9.1 mg/dL (ref 8.9–10.3)
Chloride: 108 mmol/L (ref 98–111)
Creatinine, Ser: 0.97 mg/dL (ref 0.61–1.24)
GFR, Estimated: >60 mL/min (ref >60)
Glucose, Bld: 103 mg/dL — ABNORMAL HIGH (ref 70–99)
Potassium: 4.3 mmol/L (ref 3.5–5.1)
Sodium: 141 mmol/L (ref 135–145)

## 2022-04-03 LAB — GLUCOSE, CAPILLARY
Glucose-Capillary: 104 mg/dL — ABNORMAL HIGH (ref 70–99)
Glucose-Capillary: 109 mg/dL — ABNORMAL HIGH (ref 70–99)
Glucose-Capillary: 132 mg/dL — ABNORMAL HIGH (ref 70–99)
Glucose-Capillary: 138 mg/dL — ABNORMAL HIGH (ref 70–99)

## 2022-04-03 LAB — MAGNESIUM: Magnesium: 2.5 mg/dL — ABNORMAL HIGH (ref 1.7–2.4)

## 2022-04-03 MED ORDER — AMLODIPINE BESYLATE 5 MG PO TABS
5.0000 mg | ORAL_TABLET | Freq: Once | ORAL | Status: AC
  Administered 2022-04-03: 5 mg via ORAL
  Filled 2022-04-03: qty 1

## 2022-04-03 MED ORDER — AMLODIPINE BESYLATE 5 MG PO TABS
5.0000 mg | ORAL_TABLET | Freq: Every day | ORAL | Status: DC
  Administered 2022-04-03: 5 mg via ORAL
  Filled 2022-04-03: qty 1

## 2022-04-03 MED ORDER — AMLODIPINE BESYLATE 10 MG PO TABS
10.0000 mg | ORAL_TABLET | Freq: Every day | ORAL | Status: DC
  Administered 2022-04-04: 10 mg via ORAL
  Filled 2022-04-03: qty 1

## 2022-04-03 MED ORDER — SODIUM CHLORIDE 0.9 % IV BOLUS
1000.0000 mL | Freq: Once | INTRAVENOUS | Status: AC
  Administered 2022-04-03: 1000 mL via INTRAVENOUS

## 2022-04-03 NOTE — Progress Notes (Signed)
Mobility Specialist - Progress Note   04/03/22 1635  Mobility  Activity Ambulated independently in hallway  Level of Assistance Independent  Assistive Device None  Distance Ambulated (ft) 70 ft  Activity Response Tolerated well  $Mobility charge 1 Mobility     Pt lying in bed upon arrival, utilizing RA. Flat affect throughout session. Pt ambulated to sink for face-washing prior to continuation into hallway. Motivational cues throughout session for progressing distance. Pt returned to room and was left in bed with alarm set, needs in reach.    Kathee Delton Mobility Specialist 04/03/22, 4:46 PM

## 2022-04-03 NOTE — Progress Notes (Signed)
  Progress Note   Patient: John [name] MAY:[id number] DOB: Mar 14, 1941 DOA: 04/01/2022     1 DOS: the patient was seen and examined on 04/03/2022     Assessment and Plan: 1.  Essential hypertension Blood pressure high today.  Added Norvasc 5 mg this morning.  We will give another dose this afternoon.  Increase to 10 mg for tomorrow.  Losartan increased to 100 mg daily yesterday.  On metoprolol twice daily.  2.  Paroxysmal atrial fibrillation.  No blood thinner with previous fall and brain bleed.  Continue metoprolol.  3.  Major depressive disorder.  Patient was on the [facility] psych service where he began receiving ECT.  May have an element of serotonin syndrome.  Holding psych meds at this time as per Dr. Weber Cooks.  4.  Type 2 diabetes mellitus with hyperlipidemia.  Continue sliding scale insulin.  Hemoglobin A1c very good at 5.6 likely can stop glipizide..  Continue Lipitor          Subjective: Patient not feeling well but cannot elaborate.  Blood pressure high this morning.  Patient slow getting his words out.  Sent up from [place] floor for atrial fibrillation.  Physical Exam: Vitals:   04/03/22 0749 04/03/22 1141 04/03/22 1209 04/03/22 1343  BP: (!) 187/100 (!) 170/108 (!) 165/108 (!) 154/102  Pulse: 74 74 79 81  Resp: '18 18 18   '$ Temp: 97.6 F (36.4 C) 97.8 F (36.6 C)    TempSrc: Oral Oral    SpO2: 100% 98% 99%   Weight:      Height:       Physical Exam HENT:     Head: Normocephalic.     Mouth/Throat:     Pharynx: No oropharyngeal exudate.  Eyes:     General: Lids are normal.     Conjunctiva/sclera: Conjunctivae normal.  Cardiovascular:     Rate and Rhythm: Normal rate. Rhythm irregularly irregular.     Heart sounds: Normal heart sounds, S1 normal and S2 normal.  Pulmonary:     Breath sounds: No decreased breath sounds, wheezing, rhonchi or rales.  Abdominal:     Palpations: Abdomen is soft.     Tenderness: There is no abdominal tenderness.  Musculoskeletal:      Right lower leg: No swelling.     Left lower leg: No swelling.  Skin:    General: Skin is warm.     Findings: No rash.  Neurological:     Mental Status: He is alert and oriented to person, place, and time.    Data Reviewed: Last 2.1  Family Communication: spoke with patients wife on the phone  Disposition: Status is: Inpatient Remains inpatient appropriate because: Being treated for hypertension Planned Discharge Destination: Back to [facility] psych once blood pressure better controlled   Author: Loletha Grayer, MD 04/03/2022 4:31 PM  For on call review [email or web address].

## 2022-04-03 NOTE — Progress Notes (Signed)
  Transition of Care [facility]) Screening Note   Patient Details  Name: Helton Oleson Date of Birth: [birth year]   Transition of Care [facility]) CM/SW Contact:    Alberteen Sam, LCSW Phone Number: 04/03/2022, 11:18 AM  CSW notes patient admitted from [facility], discharge plan for patient to return to [facility] when medically cleared.  Transition of Care Department [facility]) has reviewed patient and no TOC needs have been identified at this time. We will continue to monitor patient advancement through interdisciplinary progression rounds. If new patient transition needs arise, please place a TOC consult.  [place], [place]

## 2022-04-03 NOTE — Progress Notes (Signed)
Cardiology Progress Note   Patient Name: John [name] Date of Encounter: 04/03/2022  Primary Cardiologist: John Sacramento, MD  Subjective   Patient seen on AM rounds. Nods head no when asked if he has had any chest pain or shortness of breath. Not very talkative. Currently sinus on telemetry.  Inpatient Medications    Scheduled Meds:  amLODipine  5 mg Oral Daily   aspirin EC  81 mg Oral Daily   atorvastatin  10 mg Oral Daily   enoxaparin (LOVENOX) injection  40 mg Subcutaneous Q24H   insulin aspart  0-5 Units Subcutaneous QHS   insulin aspart  0-9 Units Subcutaneous TID WC   losartan  100 mg Oral Daily   metoprolol tartrate  25 mg Oral BID   Continuous Infusions:  PRN Meds: acetaminophen, hydrALAZINE, LORazepam, metoprolol tartrate, ondansetron (ZOFRAN) IV   Vital Signs    Vitals:   04/02/22 1950 04/02/22 2331 04/03/22 0537 04/03/22 0749  BP: 111/88 134/77 (!) 154/98 (!) 187/100  Pulse: 86 66 72 74  Resp: '18 18 18 18  '$ Temp: 98.3 F (36.8 C) 98.2 F (36.8 C) 98.3 F (36.8 C) 97.6 F (36.4 C)  TempSrc: Oral   Oral  SpO2: 97% 99% 98% 100%  Weight:      Height:        Intake/Output Summary (Last 24 hours) at 04/03/2022 1114 Last data filed at 04/02/2022 1358 Gross per 24 hour  Intake 240 ml  Output --  Net 240 ml   Filed Weights   04/02/22 1833  Weight: 100 kg    Physical Exam   GEN: Well nourished, well developed, in no acute distress.  HEENT: Grossly normal.  Neck: Supple, no JVD, carotid bruits, or masses. Cardiac: RRR, no murmurs, rubs, or gallops. No clubbing, cyanosis, edema.  Radials 2+, DP/PT 2+ and equal bilaterally.  Respiratory:  Respirations regular and unlabored, clear to auscultation bilaterally. GI: Soft, nontender, nondistended, BS + x 4. MS: no deformity or atrophy. Skin: warm and dry, no rash. Neuro:  Strength and sensation are intact. Psych: AAOx3.  Flat affect.  Labs    Chemistry Recent Labs  Lab 04/01/22 1539 04/02/22 1323  04/03/22 0313  NA 139 139 141  K 3.5 3.6 4.3  CL 107 107 108  CO2 21* 21* 28  GLUCOSE 129* 110* 103*  BUN '22 19 21  '$ CREATININE 0.95 0.84 0.97  CALCIUM 9.4 9.4 9.1  GFRNONAA >60 >60 >60  ANIONGAP '11 11 5     '$ Hematology Recent Labs  Lab 04/01/22 1539  WBC 5.8  RBC 5.84*  HGB 18.1*  HCT 52.7*  MCV 90.2  MCH 31.0  MCHC 34.3  RDW 12.5  PLT 178    Cardiac Enzymes No results for input(s): TROPONINIHS in the last 720 hours.    BNP No results found for: BNP  ProBNP No results found for: PROBNP   DDimer No results for input(s): DDIMER in the last 168 hours.   Lipids  Lab Results  Component Value Date   CHOL 131 03/19/2022   HDL 33 (L) 03/19/2022   LDLCALC 80 03/19/2022   TRIG 92 03/19/2022   CHOLHDL 4.0 03/19/2022    HbA1c  Lab Results  Component Value Date   HGBA1C 5.6 03/19/2022    Radiology     Telemetry    SR with unifocal PVC's and PAC's- Personally Reviewed  ECG    No new tracings- Personally Reviewed  Cardiac Studies  Echocardiogram completed on 04/02/2022 1. Left  ventricular ejection fraction, by estimation, is >55%. The left  ventricle has normal function. Left ventricular endocardial border not  optimally defined to evaluate regional wall motion. There is moderate left  ventricular hypertrophy. Left  ventricular diastolic parameters are consistent with Grade I diastolic  dysfunction (impaired relaxation).   2. Right ventricular systolic function is normal. The right ventricular  size is normal. Mildly increased right ventricular wall thickness.   3. The mitral valve is grossly normal. No evidence of mitral valve  regurgitation. No evidence of mitral stenosis.   4. The aortic valve was not well visualized. Aortic valve regurgitation  is not visualized. No aortic stenosis is present.   5. Aortic dilatation noted. 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   Patient Profile     81 y.o. male with a history of anxiety, depression, DMII, essentila hypertension, obesity, OSA, carotid disease (1-39%)/RICA dissection (2016), traumatic epidural hematoma s/p craniotomy at the age of 88, spontaneous SDH, and prostate cancer, who had been hospitalized in [facility] since mid-May and was found to have PAF prior to ECT on 04/01/2022.  Assessment & Plan    PAF found prior to ECT on 04/01/2022 - Currently in SR -continue metoprolol tartrate 25 mg bid -EKG ordered for today to evaluated first degree AVB after starting beta blocker therapy -CHADSVASc 5 with no OAC 2/2 h/o spontaneous SDH -echocardiogram revealed EF >55%, moderate LVH, aortic dilatation noted with aortic root measuring 40 mm and ascending aorta measuring 45 mm -recommend outpatient follow up with EP for consideration of watchman -will need yearly follow up on aortic dilatation  -no finding on echocardiogram to prevent patient from returning to [facility]  2. Essential hypertension - blood pressure remains slightly elevated -continue losartan 100 mg daily, metoprolol tartrate 25 mg bid -start amlodipine 5 mg daily  Signed, John Lett, NP  04/03/2022, 11:14 AM    For questions or updates, please contact   Please consult [email or web address] for contact info under Cardiology/STEMI.

## 2022-04-03 NOTE — Progress Notes (Signed)
PT Cancellation Note  Patient Details Name: John [name] MRN: [id number] DOB: [birth year]   Cancelled Treatment:     PT attempted several times today however pt refused."NO" He has had elevated BP and per RN staff does not look/feel as well today as previous date. PT will continue to follow and progress as able per current POC.   Willette Pa 04/03/2022, 2:43 PM

## 2022-04-04 ENCOUNTER — Inpatient Hospital Stay
Admission: RE | Admit: 2022-04-04 | Discharge: 2022-04-06 | DRG: 885 | Disposition: A | Discharge status: Home / Self Care | Payer: Medicare Other | Source: Intra-hospital | Attending: Psychiatry | Admitting: Psychiatry

## 2022-04-04 ENCOUNTER — Encounter: Payer: Self-pay | Admitting: Psychiatry

## 2022-04-04 ENCOUNTER — Other Ambulatory Visit: Payer: Self-pay

## 2022-04-04 DIAGNOSIS — G902 Horner's syndrome: Secondary | ICD-10-CM | POA: Diagnosis present

## 2022-04-04 DIAGNOSIS — Z8546 Personal history of malignant neoplasm of prostate: Secondary | ICD-10-CM

## 2022-04-04 DIAGNOSIS — G4730 Sleep apnea, unspecified: Secondary | ICD-10-CM | POA: Diagnosis present

## 2022-04-04 DIAGNOSIS — E1169 Type 2 diabetes mellitus with other specified complication: Secondary | ICD-10-CM | POA: Diagnosis not present

## 2022-04-04 DIAGNOSIS — Z8249 Family history of ischemic heart disease and other diseases of the circulatory system: Secondary | ICD-10-CM

## 2022-04-04 DIAGNOSIS — W010XXA Fall on same level from slipping, tripping and stumbling without subsequent striking against object, initial encounter: Secondary | ICD-10-CM | POA: Diagnosis not present

## 2022-04-04 DIAGNOSIS — F419 Anxiety disorder, unspecified: Secondary | ICD-10-CM | POA: Diagnosis present

## 2022-04-04 DIAGNOSIS — D751 Secondary polycythemia: Secondary | ICD-10-CM | POA: Diagnosis not present

## 2022-04-04 DIAGNOSIS — Z79899 Other long term (current) drug therapy: Secondary | ICD-10-CM

## 2022-04-04 DIAGNOSIS — Z7982 Long term (current) use of aspirin: Secondary | ICD-10-CM | POA: Diagnosis not present

## 2022-04-04 DIAGNOSIS — E860 Dehydration: Secondary | ICD-10-CM | POA: Diagnosis present

## 2022-04-04 DIAGNOSIS — Y92230 Patient room in hospital as the place of occurrence of the external cause: Secondary | ICD-10-CM | POA: Diagnosis not present

## 2022-04-04 DIAGNOSIS — Z833 Family history of diabetes mellitus: Secondary | ICD-10-CM

## 2022-04-04 DIAGNOSIS — E119 Type 2 diabetes mellitus without complications: Secondary | ICD-10-CM | POA: Diagnosis present

## 2022-04-04 DIAGNOSIS — G4700 Insomnia, unspecified: Secondary | ICD-10-CM | POA: Diagnosis present

## 2022-04-04 DIAGNOSIS — F332 Major depressive disorder, recurrent severe without psychotic features: Secondary | ICD-10-CM | POA: Diagnosis present

## 2022-04-04 DIAGNOSIS — M25559 Pain in unspecified hip: Secondary | ICD-10-CM | POA: Diagnosis not present

## 2022-04-04 DIAGNOSIS — Z8042 Family history of malignant neoplasm of prostate: Secondary | ICD-10-CM

## 2022-04-04 DIAGNOSIS — Z9151 Personal history of suicidal behavior: Secondary | ICD-10-CM

## 2022-04-04 DIAGNOSIS — F418 Other specified anxiety disorders: Secondary | ICD-10-CM | POA: Diagnosis not present

## 2022-04-04 DIAGNOSIS — Z818 Family history of other mental and behavioral disorders: Secondary | ICD-10-CM

## 2022-04-04 DIAGNOSIS — E785 Hyperlipidemia, unspecified: Secondary | ICD-10-CM | POA: Diagnosis not present

## 2022-04-04 DIAGNOSIS — I10 Essential (primary) hypertension: Secondary | ICD-10-CM | POA: Diagnosis present

## 2022-04-04 DIAGNOSIS — I4891 Unspecified atrial fibrillation: Secondary | ICD-10-CM | POA: Diagnosis not present

## 2022-04-04 DIAGNOSIS — H9193 Unspecified hearing loss, bilateral: Secondary | ICD-10-CM | POA: Diagnosis present

## 2022-04-04 DIAGNOSIS — S72001A Fracture of unspecified part of neck of right femur, initial encounter for closed fracture: Secondary | ICD-10-CM | POA: Diagnosis not present

## 2022-04-04 DIAGNOSIS — I480 Paroxysmal atrial fibrillation: Secondary | ICD-10-CM | POA: Diagnosis not present

## 2022-04-04 LAB — BASIC METABOLIC PANEL
Anion gap: 9 (ref 5–15)
BUN: 20 mg/dL (ref 8–23)
CO2: 17 mmol/L — ABNORMAL LOW (ref 22–32)
Calcium: 9.1 mg/dL (ref 8.9–10.3)
Chloride: 113 mmol/L — ABNORMAL HIGH (ref 98–111)
Creatinine, Ser: 0.71 mg/dL (ref 0.61–1.24)
GFR, Estimated: >60 mL/min (ref >60)
Glucose, Bld: 115 mg/dL — ABNORMAL HIGH (ref 70–99)
Potassium: 3.5 mmol/L (ref 3.5–5.1)
Sodium: 139 mmol/L (ref 135–145)

## 2022-04-04 LAB — CBC
HCT: 54.8 % — ABNORMAL HIGH (ref 39.0–52.0)
Hemoglobin: 19.2 g/dL — ABNORMAL HIGH (ref 13.0–17.0)
MCH: 31.6 pg (ref 26.0–34.0)
MCHC: 35 g/dL (ref 30.0–36.0)
MCV: 90.1 fL (ref 80.0–100.0)
Platelets: 200 10*3/uL (ref 150–400)
RBC: 6.08 MIL/uL — ABNORMAL HIGH (ref 4.22–5.81)
RDW: 13.2 % (ref 11.5–15.5)
WBC: 7.2 10*3/uL (ref 4.0–10.5)
nRBC: 0 % (ref 0.0–0.2)

## 2022-04-04 LAB — GLUCOSE, CAPILLARY
Glucose-Capillary: 225 mg/dL — ABNORMAL HIGH (ref 70–99)
Glucose-Capillary: 108 mg/dL — ABNORMAL HIGH (ref 70–99)
Glucose-Capillary: 130 mg/dL — ABNORMAL HIGH (ref 70–99)
Glucose-Capillary: 144 mg/dL — ABNORMAL HIGH (ref 70–99)

## 2022-04-04 LAB — SARS CORONAVIRUS 2 BY RT PCR: SARS Coronavirus 2 by RT PCR: NEGATIVE

## 2022-04-04 MED ORDER — AMLODIPINE BESYLATE 5 MG PO TABS
10.0000 mg | ORAL_TABLET | Freq: Every day | ORAL | Status: DC
  Administered 2022-04-06: 10 mg via ORAL
  Filled 2022-04-04: qty 2

## 2022-04-04 MED ORDER — METOPROLOL TARTRATE 25 MG PO TABS
25.0000 mg | ORAL_TABLET | Freq: Two times a day (BID) | ORAL | Status: DC
  Administered 2022-04-05 – 2022-04-06 (×2): 25 mg via ORAL
  Filled 2022-04-04 (×2): qty 1

## 2022-04-04 MED ORDER — LORAZEPAM 1 MG PO TABS: 1.0000 mg | ORAL_TABLET | ORAL | 0 refills | Status: DC | PRN

## 2022-04-04 MED ORDER — MIDAZOLAM HCL 2 MG/2ML IJ SOLN: 2.0000 mg | Freq: Once | INTRAMUSCULAR | Status: DC

## 2022-04-04 MED ORDER — SODIUM CHLORIDE 0.9 % IV SOLN: INTRAVENOUS | Status: DC

## 2022-04-04 MED ORDER — ASPIRIN 81 MG PO TBEC
81.0000 mg | DELAYED_RELEASE_TABLET | Freq: Every day | ORAL | Status: DC
  Administered 2022-04-06: 81 mg via ORAL
  Filled 2022-04-04 (×2): qty 1

## 2022-04-04 MED ORDER — ALUM & MAG HYDROXIDE-SIMETH 200-200-20 MG/5ML PO SUSP: 30.0000 mL | ORAL | Status: DC | PRN

## 2022-04-04 MED ORDER — ATORVASTATIN CALCIUM 10 MG PO TABS: 10.0000 mg | ORAL_TABLET | Freq: Every day | ORAL | Status: DC

## 2022-04-04 MED ORDER — LOSARTAN POTASSIUM 100 MG PO TABS: 100.0000 mg | ORAL_TABLET | Freq: Every day | ORAL | Status: DC

## 2022-04-04 MED ORDER — ATORVASTATIN CALCIUM 10 MG PO TABS
10.0000 mg | ORAL_TABLET | Freq: Every day | ORAL | Status: DC
  Administered 2022-04-06: 10 mg via ORAL
  Filled 2022-04-04 (×2): qty 1

## 2022-04-04 MED ORDER — TRAZODONE HCL 100 MG PO TABS
100.0000 mg | ORAL_TABLET | Freq: Every evening | ORAL | Status: DC | PRN
  Administered 2022-04-05: 100 mg via ORAL
  Filled 2022-04-04: qty 1

## 2022-04-04 MED ORDER — LOSARTAN POTASSIUM 25 MG PO TABS
100.0000 mg | ORAL_TABLET | Freq: Every day | ORAL | Status: DC
  Administered 2022-04-06: 100 mg via ORAL
  Filled 2022-04-04 (×2): qty 4

## 2022-04-04 MED ORDER — ACETAMINOPHEN 325 MG PO TABS
650.0000 mg | ORAL_TABLET | Freq: Four times a day (QID) | ORAL | Status: DC | PRN
  Administered 2022-04-06: 650 mg via ORAL
  Filled 2022-04-04: qty 2

## 2022-04-04 MED ORDER — AMLODIPINE BESYLATE 10 MG PO TABS: 10.0000 mg | ORAL_TABLET | Freq: Every day | ORAL | Status: DC

## 2022-04-04 MED ORDER — MAGNESIUM HYDROXIDE 400 MG/5ML PO SUSP: 30.0000 mL | Freq: Every day | ORAL | Status: DC | PRN

## 2022-04-04 MED ORDER — LORAZEPAM 1 MG PO TABS: 1.0000 mg | ORAL_TABLET | ORAL | Status: DC | PRN

## 2022-04-04 MED ORDER — METOPROLOL TARTRATE 5 MG/5ML IV SOLN: 2.5000 mg | INTRAVENOUS | Status: DC | PRN

## 2022-04-04 NOTE — Progress Notes (Signed)
CSW notes that RN reported patient's spouse interested in call from [employer], [employer].   CSW had called patient's spouse x2 on 6/7 no answer, lvm. Patient's spouse called back today 6/8 and lvm with CSW to call her back.   CSW called patient's spouse back once again, she reports she has questions. States patient seems to have high BP but she spoke with doctor about that this morning. CSW inquired as to any discharge planning questions. Spouse reports patient needs more underwear, CSW suggested spouse bring some to hospital if needed and confirmed spouse has room number. Spouse reports she guesses she could bring the underwear..   She reports no further questions.   [place], [place]

## 2022-04-04 NOTE — Discharge Summary (Signed)
Physician Discharge Summary   Patient: John [name] MRN: [id number] DOB: Jun 16, 1941  Admit date:     04/01/2022  Discharge date:   Discharge Physician: Loletha Grayer   PCP: Juluis Pitch, MD   Recommendations at discharge:   Follow-up on Geri psych floor with psychiatry team  Discharge Diagnoses: Principal Problem:   Essential hypertension Active Problems:   Paroxysmal atrial fibrillation ([place])   Depression with anxiety   Apnea, sleep   History of seizure   Type 2 diabetes mellitus with hyperlipidemia [facility])   Erythrocytosis   Hospital Course: The patient was transferred from the [facility] psych floor to the medical floor on 04/01/2022 secondary to atrial fibrillation.  The patient was seen by cardiology and started on metoprolol.  Losartan and Norvasc also added for blood pressure control.  The patient's psychiatric medications were discontinued just in case this was serotonin syndrome.  The patient also had confusion over the last couple days.  Confusion improved on 04/04/2022.  Psychiatry team will take back to the psychiatry floor when bed available to continue ECT treatment.  Assessment and Plan: * Essential hypertension Blood pressure better controlled on Norvasc, losartan and metoprolol.  Paroxysmal atrial fibrillation (HCC) Continue metoprolol.  No anticoagulation with high risk of fall and previous brain hemorrhage.  Depression with anxiety Patient better getting his words out today than yesterday.  Faster with his responses.  ECT will be restarted tomorrow.  Potential discharge to the psychiatric unit tomorrow.  Erythrocytosis Case discussed with hematologist Dr. Rogue Bussing.  He recommended following up with the erythropoietin level.  If the erythropoietin level is low he would need phlebotomy.  Hemoglobin higher today at 19.2.   Erythropoietin and JAK2 testing pending.  IV fluids given yesterday.  Already on aspirin.  Type 2 diabetes mellitus with hyperlipidemia  (HCC) Hemoglobin A1c low at 5.6.  Likely can stop glipizide and just watch with checking sugars daily.  Continue Lipitor.         Consultants: Psychiatry Procedures performed: None Disposition: Geri psych floor Diet recommendation:  Cardiac and Carb modified diet DISCHARGE MEDICATION: Allergies as of 04/04/2022   No Known Allergies      Medication List     TAKE these medications    amLODipine 10 MG tablet Commonly known as: NORVASC Take 1 tablet (10 mg total) by mouth daily. Start taking on: April 05, 2022   aspirin EC 81 MG tablet Take 81 mg by mouth daily.   atorvastatin 10 MG tablet Commonly known as: LIPITOR Take 1 tablet (10 mg total) by mouth daily. Start taking on: April 05, 2022   LORazepam 1 MG tablet Commonly known as: ATIVAN Take 1 tablet (1 mg total) by mouth every 4 (four) hours as needed for anxiety, sedation or sleep. What changed:  medication strength how much to take when to take this reasons to take this   losartan 100 MG tablet Commonly known as: COZAAR Take 1 tablet (100 mg total) by mouth daily. Start taking on: April 05, 2022        Discharge Exam: Danley Danker Weights   04/02/22 1833  Weight: 100 kg   Physical Exam HENT:     Head: Normocephalic.     Mouth/Throat:     Pharynx: No oropharyngeal exudate.  Eyes:     General: Lids are normal.     Conjunctiva/sclera: Conjunctivae normal.  Cardiovascular:     Rate and Rhythm: Normal rate. Rhythm irregularly irregular.     Heart sounds: Normal heart sounds, S1 normal  and S2 normal.  Pulmonary:     Breath sounds: No decreased breath sounds, wheezing, rhonchi or rales.  Abdominal:     Palpations: Abdomen is soft.     Tenderness: There is no abdominal tenderness.  Musculoskeletal:     Right lower leg: No swelling.     Left lower leg: No swelling.  Skin:    General: Skin is warm.     Findings: No rash.  Neurological:     Mental Status: He is alert.     Comments: Answers questions  appropriately.  Able to talk quicker today than yesterday.      Condition at discharge: stable  The results of significant diagnostics from this hospitalization (including imaging, microbiology, ancillary and laboratory) are listed below for reference.   Imaging Studies: ECHOCARDIOGRAM COMPLETE  Result Date: 04/02/2022    ECHOCARDIOGRAM REPORT   Patient Name:   John [name] Date of Exam: 04/02/2022 Medical Rec #:  [id number]   Height:       74.0 in Accession #:    [id number]  Weight:       220.5 lb Date of Birth:  11-24-1940    BSA:          2.265 m Patient Age:    81 years    BP:           174/101 mmHg Patient Gender: M           HR:           80 bpm. Exam Location:  [facility] Procedure: 2D Echo, Cardiac Doppler and Color Doppler Indications:     I48.91 Atrial Fibrillation  History:         Patient has no prior history of Echocardiogram examinations.                  Arrythmias:Atrial Fibrillation; Risk Factors:Hypertension,                  Sleep Apnea and Diabetes.  Sonographer:     Rosalia Hammers Referring Phys:  [id number] Clance Boll BERGE Diagnosing Phys: Nelva Bush MD  Sonographer Comments: Technically challenging study due to limited acoustic windows. Image acquisition challenging due to uncooperative patient and Image acquisition challenging due to respiratory motion. IMPRESSIONS  1. Left ventricular ejection fraction, by estimation, is >55%. The left ventricle has normal function. Left ventricular endocardial border not optimally defined to evaluate regional wall motion. There is moderate left ventricular hypertrophy. Left ventricular diastolic parameters are consistent with Grade I diastolic dysfunction (impaired relaxation).  2. Right ventricular systolic function is normal. The right ventricular size is normal. Mildly increased right ventricular wall thickness.  3. The mitral valve is grossly normal. No evidence of mitral valve regurgitation. No evidence of mitral stenosis.  4. The aortic valve  was not well visualized. Aortic valve regurgitation is not visualized. No aortic stenosis is present.  5. Aortic dilatation noted. 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 FINDINGS  Left Ventricle: Left ventricular ejection fraction, by estimation, is >55%. The left ventricle has normal function. Left ventricular endocardial border not optimally defined to evaluate regional wall motion. The left ventricular internal cavity size was  normal in size. There is moderate left ventricular hypertrophy. Left ventricular diastolic parameters are consistent with Grade I diastolic dysfunction (impaired relaxation). Right Ventricle: The right ventricular size is normal. Mildly increased right ventricular wall thickness. Right ventricular systolic function is  normal. Left Atrium: Left atrial size was normal in size. Right Atrium: Right atrial size was normal in size. Pericardium: The pericardium was not well visualized. Mitral Valve: The mitral valve is grossly normal. No evidence of mitral valve regurgitation. No evidence of mitral valve stenosis. MV peak gradient, 3.1 mmHg. The mean mitral valve gradient is 1.0 mmHg. Tricuspid Valve: The tricuspid valve is not well visualized. Tricuspid valve regurgitation is not demonstrated. Aortic Valve: The aortic valve was not well visualized. Aortic valve regurgitation is not visualized. No aortic stenosis is present. Aortic valve mean gradient measures 3.0 mmHg. Aortic valve peak gradient measures 5.7 mmHg. Aortic valve area, by VTI measures 3.68 cm. Pulmonic Valve: The pulmonic valve was not well visualized. Pulmonic valve regurgitation is not visualized. No evidence of pulmonic stenosis. Aorta: Aortic dilatation noted. 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 Pulmonary Artery: The pulmonary artery is not  well seen. Venous: The inferior vena cava was not well visualized. IAS/Shunts: The interatrial septum was not well visualized.  LEFT VENTRICLE PLAX 2D LVIDd:         4.21 cm   Diastology LVIDs:         3.02 cm   LV e' medial:    6.31 cm/s LV PW:         1.60 cm   LV E/e' medial:  9.6 LV IVS:        1.57 cm   LV e' lateral:   8.70 cm/s LVOT diam:     2.20 cm   LV E/e' lateral: 6.9 LV SV:         81 LV SV Index:   36 LVOT Area:     3.80 cm  RIGHT VENTRICLE RV S prime:     14.70 cm/s LEFT ATRIUM             Index        RIGHT ATRIUM           Index LA diam:        4.50 cm 1.99 cm/m   RA Area:     16.80 cm LA Vol (A2C):   46.3 ml 20.44 ml/m  RA Volume:   37.50 ml  16.55 ml/m LA Vol (A4C):   53.2 ml 23.48 ml/m LA Biplane Vol: 50.5 ml 22.29 ml/m  AORTIC VALVE                    PULMONIC VALVE AV Area (Vmax):    3.55 cm     PV Vmax:       0.98 m/s AV Area (Vmean):   3.78 cm     PV Vmean:      66.000 cm/s AV Area (VTI):     3.68 cm     PV VTI:        0.159 m AV Vmax:           119.00 cm/s  PV Peak grad:  3.8 mmHg AV Vmean:          86.900 cm/s  PV Mean grad:  2.0 mmHg AV VTI:            0.221 m AV Peak Grad:      5.7 mmHg AV Mean Grad:      3.0 mmHg LVOT Vmax:         111.00 cm/s LVOT Vmean:        86.400  cm/s LVOT VTI:          0.214 m LVOT/AV VTI ratio: 0.97  AORTA Ao Root diam: 4.00 cm MITRAL VALVE MV Area (PHT): 3.01 cm    SHUNTS MV Area VTI:   3.75 cm    Systemic VTI:  0.21 m MV Peak grad:  3.1 mmHg    Systemic Diam: 2.20 cm MV Mean grad:  1.0 mmHg MV Vmax:       0.88 m/s MV Vmean:      55.2 cm/s MV Decel Time: 252 msec MV E velocity: 60.40 cm/s MV A velocity: 84.80 cm/s MV E/A ratio:  0.71 Harrell Gave End MD Electronically signed by Nelva Bush MD Signature Date/Time: 04/02/2022/1:04:45 PM    Final    CT HEAD WO CONTRAST (5MM)  Result Date: 03/25/2022 CLINICAL DATA:  Head trauma, minor (Age >= 65y).  Fall. EXAM: CT HEAD WITHOUT CONTRAST TECHNIQUE: Contiguous axial images were obtained from the base  of the skull through the vertex without intravenous contrast. RADIATION DOSE REDUCTION: This exam was performed according to the departmental dose-optimization program which includes automated exposure control, adjustment of the mA and/or kV according to patient size and/or use of iterative reconstruction technique. COMPARISON:  07/20/2015 FINDINGS: Brain: No acute intracranial abnormality. Specifically, no hemorrhage, hydrocephalus, mass lesion, acute infarction, or significant intracranial injury. Vascular: No hyperdense vessel or unexpected calcification. Skull: No acute calvarial abnormality. Postoperative changes in the left frontal bone, unchanged. Sinuses/Orbits: No acute findings Other: None IMPRESSION: No acute intracranial abnormality. Electronically Signed   By: Rolm Baptise M.D.   On: 03/25/2022 02:10    Microbiology: Results for orders placed or performed during the hospital encounter of 03/12/22  Resp Panel by RT-PCR (Flu A&B, Covid) Nasopharyngeal Swab     Status: None   Collection Time: 03/12/22  1:19 PM   Specimen: Nasopharyngeal Swab; Nasopharyngeal(NP) swabs in vial transport medium  Result Value Ref Range Status   SARS Coronavirus 2 by RT PCR NEGATIVE NEGATIVE Final    Comment: (NOTE) SARS-CoV-2 target nucleic acids are NOT DETECTED.  The SARS-CoV-2 RNA is generally detectable in upper respiratory specimens during the acute phase of infection. The lowest concentration of SARS-CoV-2 viral copies this assay can detect is 138 copies/mL. A negative result does not preclude SARS-Cov-2 infection and should not be used as the sole basis for treatment or other patient management decisions. A negative result may occur with  improper specimen collection/handling, submission of specimen other than nasopharyngeal swab, presence of viral mutation(s) within the areas targeted by this assay, and inadequate number of viral copies(<138 copies/mL). A negative result must be combined  with clinical observations, patient history, and epidemiological information. The expected result is Negative.  Fact Sheet for Patients:  EntrepreneurPulse.com.au  Fact Sheet for Healthcare Providers:  IncredibleEmployment.be  This test is no t yet approved or cleared by the [place] FDA and  has been authorized for detection and/or diagnosis of SARS-CoV-2 by FDA under an Emergency Use Authorization (EUA). This EUA will remain  in effect (meaning this test can be used) for the duration of the COVID-19 declaration under Section 564(b)(1) of the Act, 21 U.S.C.section 360bbb-3(b)(1), unless the authorization is terminated  or revoked sooner.       Influenza A by PCR NEGATIVE NEGATIVE Final   Influenza B by PCR NEGATIVE NEGATIVE Final    Comment: (NOTE) The Xpert Xpress SARS-CoV-2/FLU/RSV plus assay is intended as an aid in the diagnosis of influenza from Nasopharyngeal swab specimens and should not  be used as a sole basis for treatment. Nasal washings and aspirates are unacceptable for Xpert Xpress SARS-CoV-2/FLU/RSV testing.  Fact Sheet for Patients: EntrepreneurPulse.com.au  Fact Sheet for Healthcare Providers: IncredibleEmployment.be  This test is not yet approved or cleared by the [place] FDA and has been authorized for detection and/or diagnosis of SARS-CoV-2 by FDA under an Emergency Use Authorization (EUA). This EUA will remain in effect (meaning this test can be used) for the duration of the COVID-19 declaration under Section 564(b)(1) of the Act, 21 U.S.C. section 360bbb-3(b)(1), unless the authorization is terminated or revoked.  Performed at [facility], [address] [place], [place] [address]     Labs: CBC: Recent Labs  Lab 04/01/22 1539 04/04/22 0525  WBC 5.8 7.2  HGB 18.1* 19.2*  HCT 52.7* 54.8*  MCV 90.2 90.1  PLT 178 702   Basic Metabolic  Panel: Recent Labs  Lab 04/01/22 1539 04/02/22 1323 04/03/22 0313 04/04/22 0525  NA 139 139 141 139  K 3.5 3.6 4.3 3.5  CL 107 107 108 113*  CO2 21* 21* 28 17*  GLUCOSE 129* 110* 103* 115*  BUN '22 19 21 20  '$ CREATININE 0.95 0.84 0.97 0.71  CALCIUM 9.4 9.4 9.1 9.1  MG 2.1  --  2.5*  --     CBG: Recent Labs  Lab 04/03/22 1155 04/03/22 1651 04/03/22 2028 04/04/22 0901 04/04/22 1132  GLUCAP 109* 132* 138* 225* 108*    Discharge time spent: greater than 30 minutes.  Signed: Loletha Grayer, MD [facility] 04/04/2022

## 2022-04-04 NOTE — Progress Notes (Signed)
  Progress Note   Patient: John [name] OFH:[id number] DOB: 09-17-1941 DOA: 04/01/2022     2 DOS: the patient was seen and examined on 04/04/2022     Assessment and Plan: * Essential hypertension Blood pressure better controlled on Norvasc, losartan and metoprolol.  Paroxysmal atrial fibrillation (HCC) Continue metoprolol.  No anticoagulation with high risk of fall and previous brain hemorrhage.  Depression with anxiety Patient better getting his words out today than yesterday.  Faster with his responses.  ECT will be restarted tomorrow.  Potential discharge to the psychiatric unit tomorrow.  Erythrocytosis Elevation of hemoglobin.  Hemoglobin higher today at 19.2.  Will speak with hematology on whether or not therapeutic phlebotomy needed.  Erythropoietin and JAK2 testing pending.  Started on IV fluid.  Type 2 diabetes mellitus with hyperlipidemia (HCC) Hemoglobin A1c low at 5.6.  Likely can stop glipizide and just watch on sliding scale for now.  Continue Lipitor.        Subjective: Patient feels all right.  Able to talk quicker today than yesterday.  Physical Exam: Vitals:   04/04/22 0428 04/04/22 0636 04/04/22 0727 04/04/22 1133  BP: (!) 164/107 (!) 153/102 (!) 154/95 (!) 143/80  Pulse: 73  75 68  Resp: '17  17 18  '$ Temp: 97.6 F (36.4 C)  98.2 F (36.8 C) 98.3 F (36.8 C)  TempSrc: Oral  Oral Oral  SpO2: 99%  99% 99%  Weight:      Height:       Physical Exam HENT:     Head: Normocephalic.     Mouth/Throat:     Pharynx: No oropharyngeal exudate.  Eyes:     General: Lids are normal.     Conjunctiva/sclera: Conjunctivae normal.  Cardiovascular:     Rate and Rhythm: Normal rate. Rhythm irregularly irregular.     Heart sounds: Normal heart sounds, S1 normal and S2 normal.  Pulmonary:     Breath sounds: No decreased breath sounds, wheezing, rhonchi or rales.  Abdominal:     Palpations: Abdomen is soft.     Tenderness: There is no abdominal tenderness.   Musculoskeletal:     Right lower leg: No swelling.     Left lower leg: No swelling.  Skin:    General: Skin is warm.     Findings: No rash.  Neurological:     Mental Status: He is alert.     Comments: Answers questions appropriately.  Able to talk quicker today than yesterday.     Data Reviewed: Hemoglobin 19.2  Family Communication: Spoke with patient's wife on the phone  Disposition: Status is: Inpatient Remains inpatient appropriate because: Being treated for high blood pressure.  Psychiatry to restart ECT treatments. Planned Discharge Destination: Potential discharge back to [facility] psych floor tomorrow pending bed availability   Author: Loletha Grayer, MD 04/04/2022 1:39 PM  For on call review [email or web address].

## 2022-04-04 NOTE — Progress Notes (Signed)
Mobility Specialist - Progress Note   04/04/22 1112  Mobility  Activity Ambulated independently in room;Stood at bedside;Dangled on edge of bed  Level of Assistance Independent  Assistive Device None  Distance Ambulated (ft) 30 ft  Activity Response Tolerated well  $Mobility charge 1 Mobility     Pt lying in bed upon arrival, utilizing RA. Pt a little more communicable with Pryor Curia this date, but still has periods where it appears pt wants to say something but digresses. Pt ambulated in room and participated in ADLs at sink (face washing, body scrub, hair combing, teeth brushing). Bed linen changed while pt OOB. Pt returned semi-supine with alarm set.    Kathee Delton Mobility Specialist 04/04/22, 12:16 PM

## 2022-04-04 NOTE — Assessment & Plan Note (Addendum)
Hemoglobin A1c low at 5.6.  Likely can stop glipizide and just watch with checking sugars daily.  Continue Lipitor.

## 2022-04-04 NOTE — Assessment & Plan Note (Signed)
Continue metoprolol.  No anticoagulation with high risk of fall and previous brain hemorrhage.

## 2022-04-04 NOTE — Assessment & Plan Note (Addendum)
Case discussed with hematologist Dr. Rogue Bussing.  He recommended following up with the erythropoietin level.  If the erythropoietin level is low he would need phlebotomy.  Hemoglobin higher today at 19.2.   Erythropoietin and JAK2 testing pending.  IV fluids given yesterday.  Already on aspirin.

## 2022-04-04 NOTE — Care Management Important Message (Signed)
Important Message  Patient Details  Name: John [name] MRN: [id number] Date of Birth: Apr 17, 1941   Medicare Important Message Given:  N/A - LOS <3 / Initial given by admissions     Dannette Barbara 04/04/2022, 1:41 PM

## 2022-04-04 NOTE — Consult Note (Signed)
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 male patient admitted with "not much better".  HPI: Patient seen on rounds today.  I have been seeing him every day this week after his initial transfer to the medical service on Monday.  Although the patient says he is not aware of it, he is clearly much better today compared to yesterday.  Yesterday he was still disoriented and thought that he was in a police station.  Today he was able to clearly identify his surroundings.  His eye contact was much better.  Speech was much more clear.  He still says he is feeling bad and looks depressed anxious and sluggish.  He told me however that he was able to ambulate to the bathroom and back today.  Past Psychiatric History: Patient has a long history of recurrent severe depression and had recently started ECT.  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  06/2015 CT Head: abnl thickening of mid-dist R cervical ICA w/o narrowing - ? vasculitis and thrombosed/healing dissection; b. 01/2021 Carotid U/S: 1-39% bilat ICA stenoses.   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 Age 33 - struck in head w/ baseball --> s/p craniotomy.   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 Approx 2015 - eval in [place] - conservatively managed.    Past Surgical History:  Procedure Laterality Date   ANKLE ARTHROSCOPY Left    fracture   CHOLECYSTECTOMY  2007   CRANIOTOMY  1958   left frontal craniotomy for epidural hematoma   INSERTION OF MESH  01/02/2022   Procedure: INSERTION OF MESH;  Surgeon: Herbert Pun, MD;  Location: [facility] ORS;  Service: General;;   LIGAMENT REPAIR Right    thumb   RETINAL DETACHMENT SURGERY     TREATMENT FISTULA ANAL  2006   Family History:  Family History  Problem Relation Age of Onset  Osteoporosis Mother    Depression Mother    Prostate cancer Father    Hypertension Father    Diabetes Brother    Kidney disease Neg Hx    Family Psychiatric  History: See previous Social History:  Social History   Substance and Sexual Activity  Alcohol Use Not Currently     Social History   Substance and Sexual Activity  Drug Use No    Social History   Socioeconomic History   Marital status: Married    Spouse name: [name]   Number of children: 2   Years of education: Not on file   Highest education level: Not on file  Occupational History   Not on file  Tobacco Use   Smoking status: Never   Smokeless tobacco: Never  Vaping Use   Vaping Use: Never used  Substance and Sexual Activity    Alcohol use: Not Currently   Drug use: No   Sexual activity: Yes    Birth control/protection: None  Other Topics Concern   Not on file  Social History Narrative   Live at home with wife.    Social Determinants of Health   Financial Resource Strain: Not on file  Food Insecurity: Not on file  Transportation Needs: Not on file  Physical Activity: Not on file  Stress: Not on file  Social Connections: Not on file   Additional Social History:    Allergies:  No Known Allergies  Labs:  Results for orders placed or performed during the hospital encounter of 04/01/22 (from the past 48 hour(s))  CK     Status: None   Collection Time: 04/02/22  1:23 PM  Result Value Ref Range   Total CK 134 49 - 397 U/L    Comment: Performed at [facility], [address] [place], [place] [address]  Basic metabolic panel     Status: Abnormal   Collection Time: 04/02/22  1:23 PM  Result Value Ref Range   Sodium 139 135 - 145 mmol/L   Potassium 3.6 3.5 - 5.1 mmol/L   Chloride 107 98 - 111 mmol/L   CO2 21 (L) 22 - 32 mmol/L   Glucose, Bld 110 (H) 70 - 99 mg/dL    Comment: Glucose reference range applies only to samples taken after fasting for at least 8 hours.   BUN 19 8 - 23 mg/dL   Creatinine, Ser 0.84 0.61 - 1.24 mg/dL   Calcium 9.4 8.9 - 10.3 mg/dL   GFR, Estimated >60 >60 mL/min    Comment: (NOTE) Calculated using the CKD-EPI Creatinine Equation (2021)    Anion gap 11 5 - 15    Comment: Performed at [facility], [address] [place], [place] [address]  Glucose, capillary     Status: Abnormal   Collection Time: 04/02/22  4:20 PM  Result Value Ref Range   Glucose-Capillary 141 (H) 70 - 99 mg/dL    Comment: Glucose reference range applies only to samples taken after fasting for at least 8 hours.  Glucose, capillary     Status: Abnormal   Collection Time: 04/02/22  9:08 PM  Result Value Ref Range   Glucose-Capillary 101 (H) 70 - 99 mg/dL    Comment: Glucose  reference range applies only to samples taken after fasting for at least 8 hours.  Basic metabolic panel     Status: Abnormal   Collection Time: 04/03/22  3:13 AM  Result Value Ref Range   Sodium 141 135 - 145 mmol/L  Potassium 4.3 3.5 - 5.1 mmol/L   Chloride 108 98 - 111 mmol/L   CO2 28 22 - 32 mmol/L   Glucose, Bld 103 (H) 70 - 99 mg/dL    Comment: Glucose reference range applies only to samples taken after fasting for at least 8 hours.   BUN 21 8 - 23 mg/dL   Creatinine, Ser 0.97 0.61 - 1.24 mg/dL   Calcium 9.1 8.9 - 10.3 mg/dL   GFR, Estimated >60 >60 mL/min    Comment: (NOTE) Calculated using the CKD-EPI Creatinine Equation (2021)    Anion gap 5 5 - 15    Comment: Performed at [facility], [address] [place], [place] [address]  Magnesium     Status: Abnormal   Collection Time: 04/03/22  3:13 AM  Result Value Ref Range   Magnesium 2.5 (H) 1.7 - 2.4 mg/dL    Comment: Performed at [facility], [address] [place], [place] [address]  Glucose, capillary     Status: Abnormal   Collection Time: 04/03/22  7:50 AM  Result Value Ref Range   Glucose-Capillary 104 (H) 70 - 99 mg/dL    Comment: Glucose reference range applies only to samples taken after fasting for at least 8 hours.  Glucose, capillary     Status: Abnormal   Collection Time: 04/03/22 11:55 AM  Result Value Ref Range   Glucose-Capillary 109 (H) 70 - 99 mg/dL    Comment: Glucose reference range applies only to samples taken after fasting for at least 8 hours.  Glucose, capillary     Status: Abnormal   Collection Time: 04/03/22  4:51 PM  Result Value Ref Range   Glucose-Capillary 132 (H) 70 - 99 mg/dL    Comment: Glucose reference range applies only to samples taken after fasting for at least 8 hours.  Glucose, capillary     Status: Abnormal   Collection Time: 04/03/22  8:28 PM  Result Value Ref Range   Glucose-Capillary 138 (H) 70 - 99 mg/dL    Comment: Glucose reference range applies  only to samples taken after fasting for at least 8 hours.  CBC     Status: Abnormal   Collection Time: 04/04/22  5:25 AM  Result Value Ref Range   WBC 7.2 4.0 - 10.5 K/uL   RBC 6.08 (H) 4.22 - 5.81 MIL/uL   Hemoglobin 19.2 (H) 13.0 - 17.0 g/dL   HCT 54.8 (H) 39.0 - 52.0 %   MCV 90.1 80.0 - 100.0 fL   MCH 31.6 26.0 - 34.0 pg   MCHC 35.0 30.0 - 36.0 g/dL   RDW 13.2 11.5 - 15.5 %   Platelets 200 150 - 400 K/uL   nRBC 0.0 0.0 - 0.2 %    Comment: Performed at [facility], [address] [place], [place] [address]  Basic metabolic panel     Status: Abnormal   Collection Time: 04/04/22  5:25 AM  Result Value Ref Range   Sodium 139 135 - 145 mmol/L   Potassium 3.5 3.5 - 5.1 mmol/L   Chloride 113 (H) 98 - 111 mmol/L   CO2 17 (L) 22 - 32 mmol/L   Glucose, Bld 115 (H) 70 - 99 mg/dL    Comment: Glucose reference range applies only to samples taken after fasting for at least 8 hours.   BUN 20 8 - 23 mg/dL   Creatinine, Ser 0.71 0.61 - 1.24 mg/dL   Calcium 9.1 8.9 - 10.3 mg/dL   GFR,  Estimated >60 >60 mL/min    Comment: (NOTE) Calculated using the CKD-EPI Creatinine Equation (2021)    Anion gap 9 5 - 15    Comment: Performed at [facility], [address] [place], [place] [address]  Glucose, capillary     Status: Abnormal   Collection Time: 04/04/22  9:01 AM  Result Value Ref Range   Glucose-Capillary 225 (H) 70 - 99 mg/dL    Comment: Glucose reference range applies only to samples taken after fasting for at least 8 hours.  Glucose, capillary     Status: Abnormal   Collection Time: 04/04/22 11:32 AM  Result Value Ref Range   Glucose-Capillary 108 (H) 70 - 99 mg/dL    Comment: Glucose reference range applies only to samples taken after fasting for at least 8 hours.    Current Facility-Administered Medications  Medication Dose Route Frequency Provider Last Rate Last Admin   0.9 %  sodium chloride infusion   Intravenous Continuous Loletha Grayer, MD 50 mL/hr at  04/04/22 0829 New Bag at 04/04/22 0829   acetaminophen (TYLENOL) tablet 650 mg  650 mg Oral Q6H PRN Ivor Costa, MD   650 mg at 04/04/22 0844   amLODipine (NORVASC) tablet 10 mg  10 mg Oral Daily Loletha Grayer, MD   10 mg at 04/04/22 6195   aspirin EC tablet 81 mg  81 mg Oral Daily Gwynne Edinger, MD   81 mg at 04/04/22 0932   atorvastatin (LIPITOR) tablet 10 mg  10 mg Oral Daily Theora Gianotti, NP   10 mg at 04/04/22 0904   enoxaparin (LOVENOX) injection 40 mg  40 mg Subcutaneous Q24H Ivor Costa, MD   40 mg at 04/03/22 2131   hydrALAZINE (APRESOLINE) injection 5 mg  5 mg Intravenous Q2H PRN Ivor Costa, MD   5 mg at 04/04/22 6712   insulin aspart (novoLOG) injection 0-5 Units  0-5 Units Subcutaneous QHS Ivor Costa, MD       insulin aspart (novoLOG) injection 0-9 Units  0-9 Units Subcutaneous TID WC Ivor Costa, MD   3 Units at 04/04/22 0902   LORazepam (ATIVAN) tablet 1 mg  1 mg Oral Q4H PRN Jiali Linney, [name], MD   1 mg at 04/02/22 1210   losartan (COZAAR) tablet 100 mg  100 mg Oral Daily Theora Gianotti, NP   100 mg at 04/04/22 4580   metoprolol tartrate (LOPRESSOR) injection 2.5-5 mg  2.5-5 mg Intravenous Q2H PRN Ivor Costa, MD       metoprolol tartrate (LOPRESSOR) tablet 25 mg  25 mg Oral BID End, Christopher, MD   25 mg at 04/04/22 0904   ondansetron (ZOFRAN) injection 4 mg  4 mg Intravenous Q8H PRN Ivor Costa, MD        Musculoskeletal: Strength & Muscle Tone: within normal limits Gait & Station: normal Patient leans: N/A            Psychiatric Specialty Exam:  Presentation  General Appearance: Appropriate for Environment  Eye Contact:Good  Speech:Clear and Coherent  Speech Volume:Normal  Handedness:No data recorded  Mood and Affect  Mood:Depressed  Affect:Congruent   Thought Process  Thought Processes:Coherent  Descriptions of Associations:Intact  Orientation:Full (Time, Place and Person)  Thought Content:Logical  History of  Schizophrenia/Schizoaffective disorder:No  Duration of Psychotic Symptoms:No data recorded Hallucinations:No data recorded Ideas of Reference:None  Suicidal Thoughts:No data recorded Homicidal Thoughts:No data recorded  Sensorium  Memory:Immediate Good; Recent Good; Remote Good  Judgment:No data recorded Insight:Good   Executive  Functions  Concentration:Good  Attention Span:Good  Recall:Good  Fund of Knowledge:Good  Language:Good   Psychomotor Activity  Psychomotor Activity:No data recorded  Assets  Assets:Desire for Improvement; Communication Skills; Financial Resources/Insurance; Housing; Intimacy; Social Support; Resilience; Physical Health   Sleep  Sleep:No data recorded  Physical Exam: Physical Exam Vitals and nursing note reviewed.  Constitutional:      Appearance: Normal appearance.  HENT:     Head: Normocephalic and atraumatic.     Mouth/Throat:     Pharynx: Oropharynx is clear.  Eyes:     Pupils: Pupils are equal, round, and reactive to light.  Cardiovascular:     Rate and Rhythm: Normal rate and regular rhythm.  Pulmonary:     Effort: Pulmonary effort is normal.     Breath sounds: Normal breath sounds.  Abdominal:     General: Abdomen is flat.     Palpations: Abdomen is soft.  Musculoskeletal:        General: Normal range of motion.  Skin:    General: Skin is warm and dry.  Neurological:     General: No focal deficit present.     Mental Status: He is alert. Mental status is at baseline.  Psychiatric:        Attention and Perception: Attention normal.        Mood and Affect: Mood is depressed. Affect is blunt.        Speech: Speech is delayed.        Behavior: Behavior is slowed and withdrawn.        Thought Content: Thought content includes suicidal ideation. Thought content does not include suicidal plan.    Review of Systems  Constitutional: Negative.   HENT: Negative.    Eyes: Negative.   Respiratory: Negative.     Cardiovascular: Negative.   Gastrointestinal: Negative.   Musculoskeletal: Negative.   Skin: Negative.   Neurological: Negative.   Psychiatric/Behavioral:  Positive for depression and suicidal ideas. Negative for hallucinations and substance abuse. The patient is not nervous/anxious.    Blood pressure (!) 143/80, pulse 68, temperature 98.3 F (36.8 C), temperature source Oral, resp. rate 18, height '6\' 2"'$  (1.88 m), weight 100 kg, SpO2 99 %. Body mass index is 28.31 kg/m.  Treatment Plan Summary: Plan at this point I am not sure we are ever going to really know why he was so confused the last couple days.  As I have mentioned I do not think it was a result of the ECT because his last ECT treatment was on Friday and he was actually doing quite well over the weekend.  I am more inclined to think he may have had serotonin syndrome although there is no way to prove this for certain.  He certainly seems to have improved with just watchful waiting.  At this point I think that he is probably well enough to be safely managed on the geriatric unit and I think he still needs inpatient mental health treatment because of his depression and having as recently as yesterday still talked about suicidal thoughts.  I also would like to get him back on the ECT schedule particularly since were going to have some difficulty restarting antidepressants.  Patient is agreeable.  I will go ahead and order n.p.o. and let the ECT staff know.2  Disposition: Recommend psychiatric Inpatient admission when medically cleared.  [name], MD 04/04/2022 12:34 PM

## 2022-04-05 ENCOUNTER — Ambulatory Visit: Payer: Medicare Other | Attending: Psychiatry

## 2022-04-05 ENCOUNTER — Inpatient Hospital Stay: Payer: Medicare Other | Admitting: Anesthesiology

## 2022-04-05 ENCOUNTER — Other Ambulatory Visit: Payer: Self-pay | Admitting: Psychiatry

## 2022-04-05 DIAGNOSIS — F332 Major depressive disorder, recurrent severe without psychotic features: Secondary | ICD-10-CM

## 2022-04-05 DIAGNOSIS — E860 Dehydration: Secondary | ICD-10-CM

## 2022-04-05 LAB — BASIC METABOLIC PANEL
Anion gap: 13 (ref 5–15)
BUN: 26 mg/dL — ABNORMAL HIGH (ref 8–23)
CO2: 19 mmol/L — ABNORMAL LOW (ref 22–32)
Calcium: 9.1 mg/dL (ref 8.9–10.3)
Chloride: 108 mmol/L (ref 98–111)
Creatinine, Ser: 1.43 mg/dL — ABNORMAL HIGH (ref 0.61–1.24)
GFR, Estimated: 50 mL/min — ABNORMAL LOW (ref >60)
Glucose, Bld: 162 mg/dL — ABNORMAL HIGH (ref 70–99)
Potassium: 3.4 mmol/L — ABNORMAL LOW (ref 3.5–5.1)
Sodium: 140 mmol/L (ref 135–145)

## 2022-04-05 LAB — CBC
HCT: 58.8 % — ABNORMAL HIGH (ref 39.0–52.0)
Hemoglobin: 19.6 g/dL — ABNORMAL HIGH (ref 13.0–17.0)
MCH: 31.7 pg (ref 26.0–34.0)
MCHC: 33.3 g/dL (ref 30.0–36.0)
MCV: 95 fL (ref 80.0–100.0)
Platelets: 247 10*3/uL (ref 150–400)
RBC: 6.19 MIL/uL — ABNORMAL HIGH (ref 4.22–5.81)
RDW: 13.3 % (ref 11.5–15.5)
WBC: 7.7 10*3/uL (ref 4.0–10.5)
nRBC: 0 % (ref 0.0–0.2)

## 2022-04-05 LAB — ERYTHROPOIETIN: Erythropoietin: 2.9 m[IU]/mL (ref 2.6–18.5)

## 2022-04-05 MED ORDER — POTASSIUM CHLORIDE 20 MEQ PO PACK
20.0000 meq | PACK | Freq: Two times a day (BID) | ORAL | Status: AC
  Administered 2022-04-05 – 2022-04-06 (×3): 20 meq via ORAL
  Filled 2022-04-05 (×3): qty 1

## 2022-04-05 MED ORDER — SUCCINYLCHOLINE CHLORIDE 200 MG/10ML IV SOSY
PREFILLED_SYRINGE | INTRAVENOUS | Status: AC
  Filled 2022-04-05: qty 10

## 2022-04-05 MED ORDER — BUPROPION HCL ER (XL) 150 MG PO TB24
150.0000 mg | ORAL_TABLET | Freq: Every day | ORAL | Status: DC
  Administered 2022-04-05 – 2022-04-06 (×2): 150 mg via ORAL
  Filled 2022-04-05 (×2): qty 1

## 2022-04-05 MED ORDER — MIDAZOLAM HCL 2 MG/2ML IJ SOLN
INTRAMUSCULAR | Status: AC
  Filled 2022-04-05: qty 2

## 2022-04-05 MED ORDER — LORAZEPAM 2 MG/ML IJ SOLN
1.0000 mg | Freq: Four times a day (QID) | INTRAMUSCULAR | Status: DC
  Administered 2022-04-05 – 2022-04-06 (×3): 1 mg via INTRAMUSCULAR
  Filled 2022-04-05 (×3): qty 1

## 2022-04-05 NOTE — Progress Notes (Signed)
Patient returned to the unit. ECT rescheduled for Monday.

## 2022-04-05 NOTE — BH Assessment (Signed)
1930 Received patient sitting in day room in a geri-chair. He is visibly agitated and and attempting to maneuver the chair by taking off the breaks. He is clearly unsafe at this moment;  patient is being monitored closely. He is denying that he is suicidal, having homicidal thoughts, auditory or visual hallucinations. His mood is undescribable by himself. This Probation officer will continue to monitor patient for safety.  2050

## 2022-04-05 NOTE — Progress Notes (Addendum)
Patient initially hesitant to take ordered medications. This writer put oral medication in his mouth directly and told patient to swallow. Staff also required to hold cup/straw to patient's mouth. After receiving IM Ativan, patient appears to be more alert. He is observed in the dayroom eating dinner. Patient remains safe on the unit at this time.

## 2022-04-05 NOTE — Progress Notes (Signed)
Pt in room, awake. BP 100/68. Reported light headedness. Given Gatorade. Staff continue to monitor.

## 2022-04-05 NOTE — Progress Notes (Signed)
Recreation Therapy Notes    Date: 04/04/2022   Time: 10:45am   Location: Craft room    Behavioral response: N/A   Intervention Topic: Decision Making     Discussion/Intervention: Patient unable to attend group.   Clinical Observations/Feedback:  Patient unable to attend group.   Ayiana Winslett LRT/CTRS            Irbin Fines 04/05/2022 12:24 PM

## 2022-04-05 NOTE — H&P (Signed)
Psychiatric Admission Assessment Adult  Patient Identification: John [name] MRN:  [id number] Date of Evaluation:  04/05/2022 Chief Complaint:  Severe recurrent major depression without psychotic features ([place]) [F33.2] Principal Diagnosis: Severe recurrent major depression without psychotic features ([place]) Diagnosis:  Principal Problem:   Severe recurrent major depression without psychotic features ([place]) Active Problems:   BP (high blood pressure)   Dehydration  History of Present Illness: Patient seen and chart reviewed.  Patient was brought back down to the psychiatric ward from the medical service.  He had been there since Monday because of concern about an arrhythmia that was discovered prior to ECT on that day.  Yesterday he had seemed to be doing better mentally but today he is very withdrawn and seems worse.  Nursing reports he has been sitting in his chair with his eyes closed barely interacting all day.  Unable to eat unable to take oral medication.  On interview the patient is sitting with his eyes closed but rocking back and forth looking very anxious and also as though we were in pain.  Cannot answer any open-ended questions.  Was able to tell me his name.  When asked where he was said it was a jail but then was able to correct himself but not answer any other questions.  Labs today notable for his creatinine going up to 1.4 from 0.7 yesterday.  Continue with some other lab abnormalities.  Patient tells me he did not eat anything today because he is not hungry. Associated Signs/Symptoms: Depression Symptoms:  psychomotor retardation, anxiety, Duration of Depression Symptoms: Greater than two weeks  (Hypo) Manic Symptoms:   None Anxiety Symptoms:  Excessive Worry, Psychotic Symptoms:   None for sure PTSD Symptoms: Negative Total Time spent with patient: 1 hour  Past Psychiatric History: Patient has a history of past depression repeated episodes throughout his life.  1 prior suicide  attempt.  Was admitted to the hospital in mid May here with depression.  Was not showing significant improvement over time and was referred for ECT.  Patient received a few ECT treatments which generally appeared to be tolerated well.  This past weekend however he decompensated for unclear reasons at the very end of Sunday and prior to treatment on Monday was seen to be delirious with an arrhythmia.  Arrhythmia has stabilized.  I still think that it is possible that he had serotonin syndrome and had discontinued his antidepressants.  Today the patient seems much worse and almost catatonic.  Is the patient at risk to self? Yes.    Has the patient been a risk to self in the past 6 months? No.  Has the patient been a risk to self within the distant past? Yes.    Is the patient a risk to others? No.  Has the patient been a risk to others in the past 6 months? No.  Has the patient been a risk to others within the distant past? No.   Prior Inpatient Therapy:   Prior Outpatient Therapy:    Alcohol Screening: Patient refused Alcohol Screening Tool: Yes 1. How often do you have a drink containing alcohol?: Never 2. How many drinks containing alcohol do you have on a typical day when you are drinking?: 1 or 2 3. How often do you have six or more drinks on one occasion?: Never AUDIT-C Score: 0 4. How often during the last year have you found that you were not able to stop drinking once you had started?: Never 5. How often  during the last year have you failed to do what was normally expected from you because of drinking?: Never 6. How often during the last year have you needed a first drink in the morning to get yourself going after a heavy drinking session?: Never 7. How often during the last year have you had a feeling of guilt of remorse after drinking?: Never 8. How often during the last year have you been unable to remember what happened the night before because you had been drinking?: Never 9. Have  you or someone else been injured as a result of your drinking?: No 10. Has a relative or friend or a doctor or another health worker been concerned about your drinking or suggested you cut down?: No Alcohol Use Disorder Identification Test Final Score (AUDIT): 0 Substance Abuse History in the last 12 months:  No. 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 06/2015 CT Head: abnl thickening of mid-dist R cervical ICA w/o narrowing - ? vasculitis and thrombosed/healing dissection; b. 01/2021 Carotid U/S: 1-39% bilat ICA stenoses.   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 Age 55 - struck in head w/ baseball --> s/p craniotomy.   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 Approx 2015 - eval in [place] - conservatively managed.    Past Surgical History:  Procedure Laterality Date   ANKLE ARTHROSCOPY Left    fracture   CHOLECYSTECTOMY  2007   [place]   left frontal craniotomy for epidural hematoma   INSERTION OF MESH  01/02/2022   Procedure: INSERTION OF MESH;  Surgeon: Herbert Pun, MD;  Location: [facility] ORS;  Service: General;;   LIGAMENT REPAIR Right    thumb   RETINAL DETACHMENT SURGERY     TREATMENT FISTULA ANAL  2006   Family History:  Family History  Problem Relation Age of Onset   Osteoporosis Mother    Depression Mother    Prostate cancer Father     Hypertension Father    Diabetes Brother    Kidney disease Neg Hx    Family Psychiatric  History: See previous Tobacco Screening:   Social History:  Social History   Substance and Sexual Activity  Alcohol Use Not Currently     Social History   Substance and Sexual Activity  Drug Use No    Additional Social History:                           Allergies:  No Known Allergies Lab Results:  Results for orders placed or performed during the hospital encounter of 04/04/22 (from the past 48 hour(s))  Basic metabolic panel     Status: Abnormal  Collection Time: 04/05/22  7:24 AM  Result Value Ref Range   Sodium 140 135 - 145 mmol/L   Potassium 3.4 (L) 3.5 - 5.1 mmol/L   Chloride 108 98 - 111 mmol/L   CO2 19 (L) 22 - 32 mmol/L   Glucose, Bld 162 (H) 70 - 99 mg/dL    Comment: Glucose reference range applies only to samples taken after fasting for at least 8 hours.   BUN 26 (H) 8 - 23 mg/dL   Creatinine, Ser 1.43 (H) 0.61 - 1.24 mg/dL   Calcium 9.1 8.9 - 10.3 mg/dL   GFR, Estimated 50 (L) >60 mL/min    Comment: (NOTE) Calculated using the CKD-EPI Creatinine Equation (2021)    Anion gap 13 5 - 15    Comment: Performed at [facility], [address] [place], [place] [address]  CBC     Status: Abnormal   Collection Time: 04/05/22  7:24 AM  Result Value Ref Range   WBC 7.7 4.0 - 10.5 K/uL   RBC 6.19 (H) 4.22 - 5.81 MIL/uL   Hemoglobin 19.6 (H) 13.0 - 17.0 g/dL   HCT 58.8 (H) 39.0 - 52.0 %   MCV 95.0 80.0 - 100.0 fL   MCH 31.7 26.0 - 34.0 pg   MCHC 33.3 30.0 - 36.0 g/dL   RDW 13.3 11.5 - 15.5 %   Platelets 247 150 - 400 K/uL   nRBC 0.0 0.0 - 0.2 %    Comment: Performed at [facility], [address] [place], [place] [address]    Blood Alcohol level:  Lab Results  Component Value Date   [facility] <10 09/81/1914    Metabolic Disorder Labs:  Lab Results  Component Value Date   HGBA1C 5.6 03/19/2022   MPG 114.02 03/19/2022   No results found  for: "PROLACTIN" Lab Results  Component Value Date   CHOL 131 03/19/2022   TRIG 92 03/19/2022   HDL 33 (L) 03/19/2022   CHOLHDL 4.0 03/19/2022   VLDL 18 03/19/2022   LDLCALC 80 03/19/2022    Current Medications: Current Facility-Administered Medications  Medication Dose Route Frequency Provider Last Rate Last Admin   acetaminophen (TYLENOL) tablet 650 mg  650 mg Oral Q6H PRN Challis Crill, [name], MD       alum & mag hydroxide-simeth (MAALOX/MYLANTA) 200-200-20 MG/5ML suspension 30 mL  30 mL Oral Q4H PRN Londin Antone, [name], MD       amLODipine (NORVASC) tablet 10 mg  10 mg Oral Daily Ulus Hazen, [name], MD       aspirin EC tablet 81 mg  81 mg Oral Daily Santresa Levett T, MD       atorvastatin (LIPITOR) tablet 10 mg  10 mg Oral Daily Earsel Shouse T, MD       buPROPion (WELLBUTRIN XL) 24 hr tablet 150 mg  150 mg Oral Daily Tayveon Lombardo T, MD       LORazepam (ATIVAN) injection 1 mg  1 mg Intramuscular Q6H Aldonia Keeven T, MD       LORazepam (ATIVAN) tablet 1 mg  1 mg Oral Q4H PRN Virgilene Stryker, [name], MD       losartan (COZAAR) tablet 100 mg  100 mg Oral Daily Saryah Loper T, MD       magnesium hydroxide (MILK OF MAGNESIA) suspension 30 mL  30 mL Oral Daily PRN Xavius Spadafore T, MD       metoprolol tartrate (LOPRESSOR) injection 2.5-5 mg  2.5-5 mg Intravenous Q2H PRN Hernan Turnage,  [name], MD       metoprolol tartrate (LOPRESSOR) tablet 25 mg  25 mg Oral BID Maniah Nading, [name], MD       midazolam (VERSED) injection 2 mg  2 mg Intravenous Once Danene Montijo T, MD       potassium chloride (KLOR-CON) packet 20 mEq  20 mEq Oral BID Maila Dukes, [name], MD       traZODone (DESYREL) tablet 100 mg  100 mg Oral QHS PRN Auna Mikkelsen, [name], MD   100 mg at 04/05/22 0027   PTA Medications: Medications Prior to Admission  Medication Sig Dispense Refill Last Dose   amLODipine (NORVASC) 10 MG tablet Take 1 tablet (10 mg total) by mouth daily.      aspirin EC 81 MG tablet Take 81 mg by mouth daily.      atorvastatin (LIPITOR) 10 MG  tablet Take 1 tablet (10 mg total) by mouth daily.      LORazepam (ATIVAN) 1 MG tablet Take 1 tablet (1 mg total) by mouth every 4 (four) hours as needed for anxiety, sedation or sleep. 30 tablet 0    losartan (COZAAR) 100 MG tablet Take 1 tablet (100 mg total) by mouth daily.       Musculoskeletal: Strength & Muscle Tone: decreased Gait & Station: unable to stand Patient leans: N/A            Psychiatric Specialty Exam:  Presentation  General Appearance: Appropriate for Environment  Eye Contact:Good  Speech:Clear and Coherent  Speech Volume:Normal  Handedness:No data recorded  Mood and Affect  Mood:Depressed  Affect:Congruent   Thought Process  Thought Processes:Coherent  Duration of Psychotic Symptoms: No data recorded Past Diagnosis of Schizophrenia or Psychoactive disorder: No  Descriptions of Associations:Intact  Orientation:Full (Time, Place and Person)  Thought Content:Logical  Hallucinations:No data recorded Ideas of Reference:None  Suicidal Thoughts:No data recorded Homicidal Thoughts:No data recorded  Sensorium  Memory:Immediate Good; Recent Good; Remote Good  Judgment:No data recorded Insight:Good   Executive Functions  Concentration:Good  Attention Span:Good  [place]  Language:Good   Psychomotor Activity  Psychomotor Activity:No data recorded  Assets  Assets:Desire for Improvement; Armed forces logistics/support/administrative officer; Financial Resources/Insurance; Housing; Intimacy; Social Support; Resilience; Physical Health   Sleep  Sleep:No data recorded   Physical Exam: Physical Exam Vitals and nursing note reviewed.  Constitutional:      Appearance: He is ill-appearing.  HENT:     Head: Normocephalic and atraumatic.     Mouth/Throat:     Pharynx: Oropharynx is clear.  Eyes:     Pupils: Pupils are equal, round, and reactive to light.  Cardiovascular:     Rate and Rhythm: Normal rate and regular rhythm.   Pulmonary:     Effort: Pulmonary effort is normal.     Breath sounds: Normal breath sounds.  Abdominal:     General: Abdomen is flat.     Palpations: Abdomen is soft.  Musculoskeletal:        General: Normal range of motion.  Skin:    General: Skin is warm and dry.  Neurological:     General: No focal deficit present.     Mental Status: He is alert. Mental status is at baseline.     Comments: Unable to cooperate with neuro exam.  Some waxy flexibility observed  Psychiatric:        Attention and Perception: He is inattentive.        Mood and Affect: Mood is anxious.  Speech: He is noncommunicative.        Cognition and Memory: Cognition is impaired.    Review of Systems  Unable to perform ROS: Psychiatric disorder   Blood pressure (!) 142/110, pulse 87, temperature (!) 97.5 F (36.4 C), resp. rate 18, height '6\' 2"'$  (1.88 m), weight 86.4 kg, SpO2 100 %. Body mass index is 24.46 kg/m.  Treatment Plan Summary: Medication management and Plan patient seeming more physically unwell today.  Blood pressure as has often been the case is labile with low readings this morning and then elevated ones in the afternoon.  His low blood pressure this morning led nursing to administer 16.9 ounces of Gatorade despite him being listed as n.p.o.  As a result ECT had to be canceled.  He is on the schedule for ECT Monday.  Meanwhile I am going to restart an antidepressant with a nonserotonergic medicine using Wellbutrin at initial dose of 150.  Also ordering Ativan on a standing basis for a day and a half to see if it improves possible catatonia.  Strongly encourage patient to be eating and drinking.  Recheck labs tomorrow.  If he is not able to consume adequate food and drink may need to call medicine again.  Observation Level/Precautions:  15 minute checks  Laboratory:  Chemistry Profile  Psychotherapy:    Medications:    Consultations:    Discharge Concerns:    Estimated LOS:  Other:      Physician Treatment Plan for Primary Diagnosis: Severe recurrent major depression without psychotic features ([place]) Long Term Goal(s): Improvement in symptoms so as ready for discharge  Short Term Goals: Ability to verbalize feelings will improve and Ability to demonstrate self-control will improve  Physician Treatment Plan for Secondary Diagnosis: Principal Problem:   Severe recurrent major depression without psychotic features ([place]) Active Problems:   BP (high blood pressure)   Dehydration  Long Term Goal(s): Improvement in symptoms so as ready for discharge  Short Term Goals: Compliance with prescribed medications will improve  I certify that inpatient services furnished can reasonably be expected to improve the patient's condition.    [name], MD 6/9/20233:52 PM

## 2022-04-05 NOTE — Progress Notes (Signed)
Patient arrived to the unit with transporters. He is AAOx4. He has delayed responses. BP was elevated when he first arrived, but an hour later it was normal. He complained of feeling dizzy at 0000. Pt sat near the nurses station. He was given a front wheel walker. He denies SI, HI, and AVH at this time. He says he has some anxiety and depression. He complained of not being able to fall asleep. Trazodone 100 mg administered as per MAR orders. Pt is safe on the unit at this time. Q 15 minute safety checks in place.

## 2022-04-05 NOTE — Tx Team (Signed)
Initial Treatment Plan 04/05/2022 1:34 AM John [name] OEH:[id number]    PATIENT STRESSORS: Health problems     PATIENT STRENGTHS: Ability for insight  Supportive family/friends    PATIENT IDENTIFIED PROBLEMS: SI  Depression  Anxiety                 DISCHARGE CRITERIA:  Improved stabilization in mood, thinking, and/or behavior  PRELIMINARY DISCHARGE PLAN: Return to previous living arrangement  PATIENT/FAMILY INVOLVEMENT: This treatment plan has been presented to and reviewed with the patient, John [name], and/or family member.  The patient and family have been given the opportunity to ask questions and make suggestions.  Corwin Levins, RN 04/05/2022, 1:34 AM

## 2022-04-05 NOTE — BHH Counselor (Signed)
CSW made first attempt to complete PSA assessment with pt. Patient exhibited thought blocking and was unable to answer CSW's questions. CSW will make attempt to complete assessment at another time when pt is able.   Lyanna Blystone [place], MSW, LCSW-A 6/9/20234:06 PM

## 2022-04-05 NOTE — Progress Notes (Signed)
This writer assumed care of patient at 1500. Patient's eyes are closed and he is breathing heavily. Patient appears anxious and will not respond to assessment questions. Patient seen speaking to MD in the dayroom.

## 2022-04-05 NOTE — Anesthesia Preprocedure Evaluation (Signed)
Anesthesia Evaluation  Patient identified by MRN, date of birth, ID band Patient awake    Reviewed: Allergy & Precautions, H&P , NPO status , Patient's Chart, lab work & pertinent test results, reviewed documented beta blocker date and time   History of Anesthesia Complications (+) PONV and history of anesthetic complications  Airway Mallampati: II   Neck ROM: full    Dental  (+) Poor Dentition   Pulmonary sleep apnea ,    Pulmonary exam normal        Cardiovascular Exercise Tolerance: Poor hypertension, On Medications Normal cardiovascular exam Rhythm:regular Rate:Normal     Neuro/Psych  Headaches, PSYCHIATRIC DISORDERS Depression    GI/Hepatic negative GI ROS, Neg liver ROS,   Endo/Other  diabetes, Well Controlled, Type 2, Oral Hypoglycemic Agents  Renal/GU negative Renal ROS  negative genitourinary   Musculoskeletal   Abdominal   Peds  Hematology negative hematology ROS (+)   Anesthesia Other Findings Past Medical History: No date: Cancer ([place]) No date: Depression No date: Depression No date: Diabetes mellitus without complication (HCC)     Comment:  type 2 08/31/2015: Dissection of carotid artery (HCC) No date: Eczema No date: Elevated PSA No date: 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, congenital bilateral: Hearing loss No date: Hemorrhoids 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 No date: Hypertension No date: Migraine No date: Obesity No date: PONV (postoperative nausea and vomiting) 2020: Prostate cancer ([place]) No date: Rosacea No date: Sleep apnea Past Surgical History: No date: ANKLE ARTHROSCOPY; Left     Comment:  fracture 2007: CHOLECYSTECTOMY 1958: CRANIOTOMY     Comment:  left frontal craniotomy for epidural hematoma 01/02/2022: INSERTION OF MESH     Comment:  Procedure: INSERTION OF MESH;  Surgeon: Herbert Pun, MD;  Location: [facility] ORS;  Service: General;; No  date: LIGAMENT REPAIR; Right     Comment:  thumb No date: RETINAL DETACHMENT SURGERY 2006: TREATMENT FISTULA ANAL BMI    Body Mass Index: 28.31 kg/m     Reproductive/Obstetrics negative OB ROS                             Anesthesia Physical  Anesthesia Plan  ASA: 3  Anesthesia Plan: General   Post-op Pain Management:    Induction:   PONV Risk Score and Plan:   Airway Management Planned:   Additional Equipment:   Intra-op Plan:   Post-operative Plan:   Informed Consent:     Dental Advisory Given  Plan Discussed with: CRNA  Anesthesia Plan Comments:         Anesthesia Quick Evaluation

## 2022-04-05 NOTE — BHH Suicide Risk Assessment (Signed)
[facility] Admission Suicide Risk Assessment   Nursing information obtained from:  Patient Demographic factors:  Male, Age 81 or older, Caucasian Current Mental Status:  NA Loss Factors:  Decline in physical health Historical Factors:  Prior suicide attempts Risk Reduction Factors:  Positive social support, Living with another person, especially a relative  Total Time spent with patient: 1 hour Principal Problem: Severe recurrent major depression without psychotic features ([place]) Diagnosis:  Principal Problem:   Severe recurrent major depression without psychotic features ([place]) Active Problems:   BP (high blood pressure)   Dehydration  Subjective Data: Patient seen and chart reviewed.  Patient transferred back from medicine where he had been stabilized for a cardiac arrhythmia.  Yesterday he seemed a little better but today he is once again extremely withdrawn.  Eyes closed most of the time almost no eye contact.  Can only answer 1 or 2 questions and then goes back to looking like he is in pain from anxiety.  Not acting out but also not eating and not cooperating with staff.  Continued Clinical Symptoms:  Alcohol Use Disorder Identification Test Final Score (AUDIT): 0 The "Alcohol Use Disorders Identification Test", Guidelines for Use in Primary Care, Second Edition.  World Pharmacologist [facility]). Score between 0-7:  no or low risk or alcohol related problems. Score between 8-15:  moderate risk of alcohol related problems. Score between 16-19:  high risk of alcohol related problems. Score 20 or above:  warrants further diagnostic evaluation for alcohol dependence and treatment.   CLINICAL FACTORS:   Severe Anxiety and/or Agitation Depression:   Severe   Musculoskeletal: Strength & Muscle Tone: decreased Gait & Station: unsteady Patient leans: N/A  Psychiatric Specialty Exam:  Presentation  General Appearance: Appropriate for Environment  Eye Contact:Good  Speech:Clear and  Coherent  Speech Volume:Normal  Handedness:No data recorded  Mood and Affect  Mood:Depressed  Affect:Congruent   Thought Process  Thought Processes:Coherent  Descriptions of Associations:Intact  Orientation:Full (Time, Place and Person)  Thought Content:Logical  History of Schizophrenia/Schizoaffective disorder:No  Duration of Psychotic Symptoms:No data recorded Hallucinations:No data recorded Ideas of Reference:None  Suicidal Thoughts:No data recorded Homicidal Thoughts:No data recorded  Sensorium  Memory:Immediate Good; Recent Good; Remote Good  Judgment:No data recorded Insight:Good   Executive Functions  Concentration:Good  Attention Span:Good  Recall:Good  Fund of Knowledge:Good  Language:Good   Psychomotor Activity  Psychomotor Activity:No data recorded  Assets  Assets:Desire for Improvement; Armed forces logistics/support/administrative officer; Financial Resources/Insurance; Housing; Intimacy; Social Support; Resilience; Physical Health   Sleep  Sleep:No data recorded   Physical Exam: Physical Exam Vitals and nursing note reviewed.  Constitutional:      Appearance: He is ill-appearing.  HENT:     Head: Normocephalic and atraumatic.     Mouth/Throat:     Pharynx: Oropharynx is clear.  Eyes:     Pupils: Pupils are equal, round, and reactive to light.  Cardiovascular:     Rate and Rhythm: Normal rate and regular rhythm.  Pulmonary:     Effort: Pulmonary effort is normal.     Breath sounds: Normal breath sounds.  Abdominal:     General: Abdomen is flat.     Palpations: Abdomen is soft.  Musculoskeletal:        General: Normal range of motion.  Skin:    General: Skin is warm and dry.       Neurological:     General: No focal deficit present.     Mental Status: He is alert. Mental status is at  baseline.     Comments: Patient unable to cooperate with full exam.  Possibly some degree of waxy flexibility in his movements.  Decreased cognitive condition and not  able to really engage in conversation  Psychiatric:        Attention and Perception: He is inattentive.        Mood and Affect: Mood is anxious.        Speech: He is noncommunicative.        Cognition and Memory: Memory is impaired.    ROS Blood pressure (!) 142/110, pulse 87, temperature (!) 97.5 F (36.4 C), resp. rate 18, height '6\' 2"'$  (1.88 m), weight 86.4 kg, SpO2 100 %. Body mass index is 24.46 kg/m.   COGNITIVE FEATURES THAT CONTRIBUTE TO RISK:  Thought constriction (tunnel vision)    SUICIDE RISK:   Mild:  Suicidal ideation of limited frequency, intensity, duration, and specificity.  There are no identifiable plans, no associated intent, mild dysphoria and related symptoms, good self-control (both objective and subjective assessment), few other risk factors, and identifiable protective factors, including available and accessible social support.  PLAN OF CARE: Continue 15-minute checks or one-to-one if needed for ambulation at discretion of nursing.  Restart medication for depression.  Plan to continue ECT after the weekend.  Engage when possible in group and individual counseling.  Ongoing assessment of dangerousness before discharge  I certify that inpatient services furnished can reasonably be expected to improve the patient's condition.   [name], MD 04/05/2022, 3:48 PM

## 2022-04-05 NOTE — Plan of Care (Signed)
Patient remained anxious and restless this morning. BP 100/68. Meds held. Currently in ECT.

## 2022-04-05 NOTE — BH IP Treatment Plan (Signed)
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 [facility])  Secondary Diagnoses: Principal Problem:   Severe recurrent major depression without psychotic features ([place]) Active Problems:   BP (high blood pressure)   Dehydration   Current Medications:  Current Facility-Administered Medications  Medication Dose Route Frequency Provider Last Rate Last Admin   acetaminophen (TYLENOL) tablet 650 mg  650 mg Oral Q6H PRN Clapacs, [name], MD       alum & mag hydroxide-simeth (MAALOX/MYLANTA) 200-200-20 MG/5ML suspension 30 mL  30 mL Oral Q4H PRN Clapacs, [name], MD       amLODipine (NORVASC) tablet 10 mg  10 mg Oral Daily Clapacs, [name], MD       aspirin EC tablet 81 mg  81 mg Oral Daily Clapacs, John T, MD       atorvastatin (LIPITOR) tablet 10 mg  10 mg Oral Daily Clapacs, John T, MD       buPROPion (WELLBUTRIN XL) 24 hr tablet 150 mg  150 mg Oral Daily Clapacs, John T, MD       LORazepam (ATIVAN) injection 1 mg  1 mg Intramuscular Q6H Clapacs, John T, MD       LORazepam (ATIVAN) tablet 1 mg  1 mg Oral Q4H PRN Clapacs, [name], MD       losartan (COZAAR) tablet 100 mg  100 mg Oral Daily Clapacs, John T, MD       magnesium hydroxide (MILK OF MAGNESIA) suspension 30 mL  30 mL Oral Daily PRN Clapacs, [name], MD       metoprolol tartrate (LOPRESSOR) injection 2.5-5 mg  2.5-5 mg Intravenous Q2H PRN Clapacs, [name], MD       metoprolol tartrate (LOPRESSOR) tablet 25 mg  25 mg Oral BID Clapacs, John T, MD       midazolam (VERSED) injection 2 mg  2 mg Intravenous Once Clapacs, John T, MD       potassium chloride (KLOR-CON) packet 20 mEq  20 mEq Oral BID Clapacs, [name], MD       traZODone (DESYREL) tablet 100 mg  100 mg Oral QHS PRN Clapacs, John T, MD   100 mg at 04/05/22 0027   PTA Medications: Medications Prior to Admission  Medication Sig  Dispense Refill Last Dose   amLODipine (NORVASC) 10 MG tablet Take 1 tablet (10 mg total) by mouth daily.      aspirin EC 81 MG tablet Take 81 mg by mouth daily.      atorvastatin (LIPITOR) 10 MG tablet Take 1 tablet (10 mg total) by mouth daily.      LORazepam (ATIVAN) 1 MG tablet Take 1 tablet (1 mg total) by mouth every 4 (four) hours as needed for anxiety, sedation or sleep. 30 tablet 0    losartan (COZAAR) 100 MG tablet Take 1 tablet (100 mg total) by mouth daily.       Patient Stressors: Health problems    Patient Strengths: Ability for insight  Supportive family/friends   Treatment Modalities: Medication Management, Group therapy, Case management,  1 to 1 session with clinician, Psychoeducation, Recreational therapy.   Physician Treatment Plan for Primary Diagnosis: Severe recurrent major depression without psychotic features ([name]) Long Term Goal(s): Improvement in symptoms so as ready for discharge   Short Term Goals: Compliance with prescribed medications will improve Ability to verbalize feelings will improve Ability to demonstrate self-control will  improve  Medication Management: Evaluate patient's response, side effects, and tolerance of medication regimen.  Therapeutic Interventions: 1 to 1 sessions, Unit Group sessions and Medication administration.  Evaluation of Outcomes: Not Met  Physician Treatment Plan for Secondary Diagnosis: Principal Problem:   Severe recurrent major depression without psychotic features ([name]) Active Problems:   BP (high blood pressure)   Dehydration  Long Term Goal(s): Improvement in symptoms so as ready for discharge   Short Term Goals: Compliance with prescribed medications will improve Ability to verbalize feelings will improve Ability to demonstrate self-control will improve     Medication Management: Evaluate patient's response, side effects, and tolerance of medication regimen.  Therapeutic Interventions: 1 to 1 sessions, Unit  Group sessions and Medication administration.  Evaluation of Outcomes: Not Met   RN Treatment Plan for Primary Diagnosis: Severe recurrent major depression without psychotic features ([name]) Long Term Goal(s): Knowledge of disease and therapeutic regimen to maintain health will improve  Short Term Goals: Ability to remain free from injury will improve, Ability to demonstrate self-control, Ability to participate in decision making will improve, Ability to verbalize feelings will improve, Ability to identify and develop effective coping behaviors will improve, and Compliance with prescribed medications will improve  Medication Management: RN will administer medications as ordered by provider, will assess and evaluate patient's response and provide education to patient for prescribed medication. RN will report any adverse and/or side effects to prescribing provider.  Therapeutic Interventions: 1 on 1 counseling sessions, Psychoeducation, Medication administration, Evaluate responses to treatment, Monitor vital signs and CBGs as ordered, Perform/monitor CIWA, COWS, AIMS and Fall Risk screenings as ordered, Perform wound care treatments as ordered.  Evaluation of Outcomes: Not Met   LCSW Treatment Plan for Primary Diagnosis: Severe recurrent major depression without psychotic features ([name]) Long Term Goal(s): Safe transition to appropriate next level of care at discharge, Engage patient in therapeutic group addressing interpersonal concerns.  Short Term Goals: Engage patient in aftercare planning with referrals and resources, Increase social support, Increase ability to appropriately verbalize feelings, Increase emotional regulation, Facilitate acceptance of mental health diagnosis and concerns, and Increase skills for wellness and recovery  Therapeutic Interventions: Assess for all discharge needs, 1 to 1 time with Social worker, Explore available resources and support systems, Assess for adequacy in  community support network, Educate family and significant other(s) on suicide prevention, Complete Psychosocial Assessment, Interpersonal group therapy.  Evaluation of Outcomes: Not Met   Progress in Treatment: Attending groups: Yes. Participating in groups: Yes. Taking medication as prescribed: No. Toleration medication: No. Family/Significant other contact made: No, will contact:  when given permission Patient understands diagnosis: Yes. Discussing patient identified problems/goals with staff: No. Medical problems stabilized or resolved: Yes. Denies suicidal/homicidal ideation: Yes. Issues/concerns per patient self-inventory: No. Other: None  New problem(s) identified: No, Describe:  None  New Short Term/Long Term Goal(s): Patient to work towards detox,  medication management for mood stabilization; development of comprehensive mental wellness plan.   Patient Goals:  Pt was unable to participate in treatment team and did not give patient goals  Discharge Plan or Barriers: CSW will assist pt with development of appropriate discharge/aftercare plan. No psychosocial barriers identified preventing pt returning home.   Reason for Continuation of Hospitalization: Anxiety Medical Issues Medication stabilization  Estimated Length of Stay: TBD  Last 3 [place] Suicide Severity Risk Score: Flowsheet Row Admission (Current) from 04/04/2022 in [facility] Admission (Discharged) from 04/01/2022 in [facility] Admission (Discharged) from 03/13/2022  in [place] No Risk No Risk No Risk       Last PHQ 2/9 Scores:    12/21/2021    8:59 AM 04/22/2017   10:46 AM  Depression screen PHQ 2/9  Decreased Interest 0 0  Down, Depressed, Hopeless 0 0  PHQ - 2 Score 0 0    Scribe for Treatment Team: Goble Fudala A [place], Latanya Presser 04/05/2022 4:13 PM

## 2022-04-05 NOTE — Progress Notes (Signed)
Patient has remained lethargic and refusing to eat anything. He is becoming more and more confused, unable to express his needs and concerns. BP 142/110 just  now. Pulse 87. O2 sat 100%. MD notified.

## 2022-04-06 ENCOUNTER — Inpatient Hospital Stay: Payer: Medicare Other

## 2022-04-06 ENCOUNTER — Inpatient Hospital Stay (HOSPITAL_COMMUNITY)
Admission: RE | Admit: 2022-04-06 | Discharge: 2022-04-11 | DRG: 522 | Disposition: A | Discharge status: Skilled Nursing Facility | Payer: Medicare Other | Source: Ambulatory Visit | Attending: Internal Medicine | Admitting: Internal Medicine

## 2022-04-06 DIAGNOSIS — Z8249 Family history of ischemic heart disease and other diseases of the circulatory system: Secondary | ICD-10-CM | POA: Diagnosis not present

## 2022-04-06 DIAGNOSIS — Z833 Family history of diabetes mellitus: Secondary | ICD-10-CM

## 2022-04-06 DIAGNOSIS — S72009A Fracture of unspecified part of neck of unspecified femur, initial encounter for closed fracture: Secondary | ICD-10-CM | POA: Diagnosis present

## 2022-04-06 DIAGNOSIS — G902 Horner's syndrome: Secondary | ICD-10-CM | POA: Diagnosis present

## 2022-04-06 DIAGNOSIS — E119 Type 2 diabetes mellitus without complications: Secondary | ICD-10-CM | POA: Diagnosis present

## 2022-04-06 DIAGNOSIS — W010XXA Fall on same level from slipping, tripping and stumbling without subsequent striking against object, initial encounter: Secondary | ICD-10-CM | POA: Diagnosis present

## 2022-04-06 DIAGNOSIS — F332 Major depressive disorder, recurrent severe without psychotic features: Secondary | ICD-10-CM | POA: Diagnosis not present

## 2022-04-06 DIAGNOSIS — Z818 Family history of other mental and behavioral disorders: Secondary | ICD-10-CM

## 2022-04-06 DIAGNOSIS — R41843 Psychomotor deficit: Secondary | ICD-10-CM | POA: Diagnosis present

## 2022-04-06 DIAGNOSIS — G4730 Sleep apnea, unspecified: Secondary | ICD-10-CM | POA: Diagnosis present

## 2022-04-06 DIAGNOSIS — Z9049 Acquired absence of other specified parts of digestive tract: Secondary | ICD-10-CM | POA: Diagnosis not present

## 2022-04-06 DIAGNOSIS — Z96641 Presence of right artificial hip joint: Secondary | ICD-10-CM | POA: Diagnosis present

## 2022-04-06 DIAGNOSIS — G4700 Insomnia, unspecified: Secondary | ICD-10-CM | POA: Diagnosis present

## 2022-04-06 DIAGNOSIS — S72011A Unspecified intracapsular fracture of right femur, initial encounter for closed fracture: Principal | ICD-10-CM | POA: Diagnosis present

## 2022-04-06 DIAGNOSIS — E860 Dehydration: Secondary | ICD-10-CM | POA: Diagnosis not present

## 2022-04-06 DIAGNOSIS — Y92231 Patient bathroom in hospital as the place of occurrence of the external cause: Secondary | ICD-10-CM | POA: Diagnosis present

## 2022-04-06 DIAGNOSIS — Z8546 Personal history of malignant neoplasm of prostate: Secondary | ICD-10-CM

## 2022-04-06 DIAGNOSIS — Z7982 Long term (current) use of aspirin: Secondary | ICD-10-CM | POA: Diagnosis not present

## 2022-04-06 DIAGNOSIS — S72001A Fracture of unspecified part of neck of right femur, initial encounter for closed fracture: Secondary | ICD-10-CM | POA: Diagnosis not present

## 2022-04-06 DIAGNOSIS — Z79899 Other long term (current) drug therapy: Secondary | ICD-10-CM | POA: Diagnosis not present

## 2022-04-06 DIAGNOSIS — E1169 Type 2 diabetes mellitus with other specified complication: Secondary | ICD-10-CM | POA: Diagnosis not present

## 2022-04-06 DIAGNOSIS — G43909 Migraine, unspecified, not intractable, without status migrainosus: Secondary | ICD-10-CM | POA: Diagnosis present

## 2022-04-06 DIAGNOSIS — E785 Hyperlipidemia, unspecified: Secondary | ICD-10-CM | POA: Diagnosis not present

## 2022-04-06 DIAGNOSIS — I480 Paroxysmal atrial fibrillation: Secondary | ICD-10-CM | POA: Diagnosis present

## 2022-04-06 DIAGNOSIS — W1830XD Fall on same level, unspecified, subsequent encounter: Secondary | ICD-10-CM | POA: Diagnosis not present

## 2022-04-06 DIAGNOSIS — F419 Anxiety disorder, unspecified: Secondary | ICD-10-CM | POA: Diagnosis present

## 2022-04-06 DIAGNOSIS — H9193 Unspecified hearing loss, bilateral: Secondary | ICD-10-CM | POA: Diagnosis present

## 2022-04-06 DIAGNOSIS — Z8679 Personal history of other diseases of the circulatory system: Secondary | ICD-10-CM | POA: Diagnosis not present

## 2022-04-06 DIAGNOSIS — I10 Essential (primary) hypertension: Secondary | ICD-10-CM | POA: Diagnosis present

## 2022-04-06 DIAGNOSIS — S72141D Displaced intertrochanteric fracture of right femur, subsequent encounter for closed fracture with routine healing: Secondary | ICD-10-CM | POA: Diagnosis not present

## 2022-04-06 DIAGNOSIS — Z8042 Family history of malignant neoplasm of prostate: Secondary | ICD-10-CM

## 2022-04-06 DIAGNOSIS — N3941 Urge incontinence: Secondary | ICD-10-CM | POA: Diagnosis present

## 2022-04-06 DIAGNOSIS — Z9151 Personal history of suicidal behavior: Secondary | ICD-10-CM | POA: Diagnosis not present

## 2022-04-06 DIAGNOSIS — R350 Frequency of micturition: Secondary | ICD-10-CM | POA: Diagnosis present

## 2022-04-06 LAB — BASIC METABOLIC PANEL
Anion gap: 3 — ABNORMAL LOW (ref 5–15)
BUN: 38 mg/dL — ABNORMAL HIGH (ref 8–23)
CO2: 25 mmol/L (ref 22–32)
Calcium: 9.2 mg/dL (ref 8.9–10.3)
Chloride: 112 mmol/L — ABNORMAL HIGH (ref 98–111)
Creatinine, Ser: 1.12 mg/dL (ref 0.61–1.24)
GFR, Estimated: >60 mL/min (ref >60)
Glucose, Bld: 115 mg/dL — ABNORMAL HIGH (ref 70–99)
Potassium: 4.2 mmol/L (ref 3.5–5.1)
Sodium: 140 mmol/L (ref 135–145)

## 2022-04-06 MED ORDER — TRAZODONE HCL 100 MG PO TABS
100.0000 mg | ORAL_TABLET | Freq: Every evening | ORAL | 0 refills | Status: DC | PRN
Start: 2022-04-06 — End: 2022-04-26

## 2022-04-06 MED ORDER — TRANEXAMIC ACID-NACL 1000-0.7 MG/100ML-% IV SOLN
1000.0000 mg | INTRAVENOUS | Status: DC
  Filled 2022-04-06: qty 100

## 2022-04-06 MED ORDER — SODIUM CHLORIDE 0.9 % IV SOLN: INTRAVENOUS | Status: DC

## 2022-04-06 MED ORDER — METOPROLOL TARTRATE 25 MG PO TABS
25.0000 mg | ORAL_TABLET | Freq: Two times a day (BID) | ORAL | 0 refills | Status: DC
Start: 2022-04-06 — End: 2022-04-26

## 2022-04-06 MED ORDER — BUPROPION HCL ER (XL) 150 MG PO TB24: 150.0000 mg | ORAL_TABLET | Freq: Every day | ORAL | 0 refills | Status: DC

## 2022-04-06 MED ORDER — METOPROLOL TARTRATE 25 MG PO TABS
25.0000 mg | ORAL_TABLET | Freq: Two times a day (BID) | ORAL | Status: DC
  Administered 2022-04-06 – 2022-04-11 (×9): 25 mg via ORAL
  Filled 2022-04-06 (×10): qty 1

## 2022-04-06 MED ORDER — BUPROPION HCL ER (XL) 150 MG PO TB24
150.0000 mg | ORAL_TABLET | Freq: Every day | ORAL | Status: DC
  Administered 2022-04-08 – 2022-04-11 (×4): 150 mg via ORAL
  Filled 2022-04-06 (×4): qty 1

## 2022-04-06 MED ORDER — CEFAZOLIN SODIUM-DEXTROSE 1-4 GM/50ML-% IV SOLN
1.0000 g | INTRAVENOUS | Status: AC
  Administered 2022-04-07 (×2): 1 g via INTRAVENOUS
  Filled 2022-04-06: qty 50

## 2022-04-06 MED ORDER — CHLORHEXIDINE GLUCONATE 4 % EX LIQD: Freq: Once | CUTANEOUS | Status: AC

## 2022-04-06 MED ORDER — METHOCARBAMOL 500 MG PO TABS
500.0000 mg | ORAL_TABLET | Freq: Four times a day (QID) | ORAL | Status: DC | PRN
  Administered 2022-04-07: 500 mg via ORAL
  Filled 2022-04-06: qty 1

## 2022-04-06 MED ORDER — SENNA 8.6 MG PO TABS
1.0000 | ORAL_TABLET | Freq: Two times a day (BID) | ORAL | Status: DC
  Administered 2022-04-06 – 2022-04-11 (×8): 8.6 mg via ORAL
  Filled 2022-04-06 (×9): qty 1

## 2022-04-06 MED ORDER — MORPHINE SULFATE (PF) 2 MG/ML IV SOLN: 0.5000 mg | INTRAVENOUS | Status: DC | PRN

## 2022-04-06 MED ORDER — HYDROCODONE-ACETAMINOPHEN 5-325 MG PO TABS: 1.0000 | ORAL_TABLET | ORAL | Status: DC | PRN

## 2022-04-06 MED ORDER — MORPHINE SULFATE (PF) 2 MG/ML IV SOLN: 1.0000 mg | INTRAVENOUS | Status: DC | PRN

## 2022-04-06 MED ORDER — OXYCODONE-ACETAMINOPHEN 5-325 MG PO TABS
1.0000 | ORAL_TABLET | Freq: Four times a day (QID) | ORAL | Status: DC | PRN
  Administered 2022-04-06: 1 via ORAL
  Filled 2022-04-06: qty 1

## 2022-04-06 MED ORDER — METHOCARBAMOL 1000 MG/10ML IJ SOLN: 500.0000 mg | Freq: Four times a day (QID) | INTRAVENOUS | Status: DC | PRN

## 2022-04-06 MED ORDER — HYDROCODONE-ACETAMINOPHEN 5-325 MG PO TABS: 1.0000 | ORAL_TABLET | Freq: Four times a day (QID) | ORAL | Status: DC | PRN

## 2022-04-06 NOTE — BHH Counselor (Signed)
Adult Comprehensive Assessment  Patient ID: John [name], male   DOB: 09-Dec-1940, 81 y.o.   MRN: [id number]  Information Source: Information source: Patient  Current Stressors:  Patient states their primary concerns and needs for treatment are:: has been more depressed recently Patient states their goals for this hospitilization and ongoing recovery are:: wants continuing therapy, has had ECT in the past and it was helpful Employment / Job issues: retired Family Relationships: pt denies Museum/gallery curator / Lack of resources (include bankruptcy): pt denies Physical health (include injuries & life threatening diseases): pt fell recently, sore hip Social relationships: pt Substance abuse: pt denies Bereavement / Loss: pt denies  Living/Environment/Situation:  Living Arrangements: Spouse/significant other Living conditions (as described by patient or guardian): good conditions Who else lives in the home?: just wife How long has patient lived in current situation?: 5-6 years What is atmosphere in current home: Comfortable  Family History:  Marital status: Married Number of Years Married: 45 What types of issues is patient dealing with in the relationship?: "it seems to be working"  Pt reports he is not close to his wife and this is a "trying problem" Are you sexually active?: No What is your sexual orientation?: heterosexual Has your sexual activity been affected by drugs, alcohol, medication, or emotional stress?: na Does patient have children?: Yes How many children?: 4 How is patient's relationship with their children?: 2 sons, 2 daughters: "we are close"  Pt reports his children "just graduated"?  Childhood History:  By whom was/is the patient raised?: Both parents Additional childhood history information: pt reports he had a postive childhood Description of patient's relationship with caregiver when they were a child: got along well with both parents Patient's description of current  relationship with people who raised him/her: both parents decease How were you disciplined when you got in trouble as a child/adolescent?: "fairly harsh" discipline, physical discipline Does patient have siblings?: Yes Number of Siblings: 3 (2 brothers, 1 sister: one bother deceased) Description of patient's current relationship with siblings: gets along well with remaining brother and sister Did patient suffer any verbal/emotional/physical/sexual abuse as a child?: No Did patient suffer from severe childhood neglect?: No Has patient ever been sexually abused/assaulted/raped as an adolescent or adult?: No Was the patient ever a victim of a crime or a disaster?: No Witnessed domestic violence?: No Has patient been affected by domestic violence as an adult?: No  Education:  Highest grade of school patient has completed: Education officer, community, Estate manager/land agent Currently a student?: No Learning disability?: No  Employment/Work Situation:   Employment Situation: Retired Chartered loss adjuster is the [employer] Time Patient has Held a Job?: pt could not recall--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, theatre/television/film" Has Patient ever Been in the [employer]?: No  Financial Resources:   Museum/gallery curator resources: [employer] (Fish farm manager and retirement income) Does patient have a Programmer, applications or guardian?: No  Alcohol/Substance Abuse:   What has been your use of drugs/alcohol within the last 12 months?: pt denies alcohol or drug use Alcohol/Substance Abuse Treatment Hx: Denies past history Has alcohol/substance abuse ever caused legal problems?: No  Social Support System:   Astronomer System: wife, daughter and son in law Type of faith/religion: Producer, television/film/video How does patient's faith help to cope with current illness?: church attendance is helpful  Leisure/Recreation:   Leisure and Hobbies: Naval architect, electronic projects  Strengths/Needs:    What is the patient's perception of their strengths?: electronic projects Patient states these  barriers may affect/interfere with their treatment: no barriers Patient states these barriers may affect their return to the community: no barriers Other important information patient would like considered in planning for their treatment: no  Discharge Plan:   Currently receiving community mental health services: Yes (From Whom) John [name]-counseling) Patient states concerns and preferences for aftercare planning are: wants to [facility] with [facility] Does patient have access to transportation?: Yes Does patient have financial barriers related to discharge medications?: No Will patient be returning to same living situation after discharge?: Yes  Summary/Recommendations:   Summary and Recommendations (to be completed by the evaluator): Pt is 81 year old male readmitted from the medical floor due to several anxiety and depression.  Pt was initially admitted due to increased depression over the past 3 months.  Recommendations include crisis stabilizaiton, therapeutic milieu, encourage group participation and attendance, medication management for mood stabilization, and development of a comprehensive mental wellness plan.  John [name]. 04/06/2022

## 2022-04-06 NOTE — Progress Notes (Signed)
BP 127/71 (BP Location: Right Arm)   Pulse 68   Temp 98.3 F (36.8 C) (Oral)   Resp 18   Ht '6\' 2"'$  (1.88 m)   Wt 86.4 kg   SpO2 96%   BMI 24.46 kg/m   Vs reassessed and are stable. Pt A&Ox4. Patient given percocet po prn for R hip pain/fracture. Patient calmly sitting in day area awaiting transfer.

## 2022-04-06 NOTE — Assessment & Plan Note (Signed)
Continue consistent carbohydrate diet and check blood sugars with meals. Patient's last hemoglobin A1c was less than 6 We will place on sliding scale coverage if he develops hypoglycemia

## 2022-04-06 NOTE — Progress Notes (Signed)
Patient returned from x-ray. Currently in day area. A&Ox4. No current distress.

## 2022-04-06 NOTE — Progress Notes (Signed)
[facility] MD Progress Note  04/06/2022 12:12 PM John [name]  MRN:  [id number] Subjective:  Patient is cooperative and ate breakfast. Able to fill out menu this morning. Reviewed medications with patient and patient is med compliant. Patient verbalized this morning that he had a fall last night but did not tell anyone and mentioned that he tripped. Very unsteady gait noted this morning and patient has been assisted while ambulating. Pt stated R hip/groin area hurting and area was assessed. No bruises or swelling noted. Patient given tylenol po prn which provided some relief. Patient currently in dayroom resting in geri-chair. Denies SI/HI and A/V/H.   Patient has been sleeping in his recliner in dayroom today but is easily awakened. He reports ongoing depression and hip pain. He is denying current SI and has been sleeping well.  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 06/2015 CT Head: abnl thickening of mid-dist R cervical ICA w/o narrowing - ? vasculitis and thrombosed/healing dissection; b. 01/2021 Carotid U/S: 1-39% bilat ICA stenoses.   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 Age 41 - struck in head w/ baseball --> s/p craniotomy.   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 Approx 2015 - eval in [place] - conservatively managed.    Past Surgical History:  Procedure Laterality Date   ANKLE ARTHROSCOPY Left    fracture   CHOLECYSTECTOMY  2007   [place]   left frontal craniotomy for epidural hematoma   INSERTION OF MESH  01/02/2022   Procedure: INSERTION OF MESH;  Surgeon: Herbert Pun, MD;  Location: [facility] ORS;  Service: General;;   LIGAMENT REPAIR Right    thumb   RETINAL DETACHMENT SURGERY     TREATMENT FISTULA ANAL  2006   Family History:  Family History  Problem Relation Age of Onset   Osteoporosis Mother    Depression Mother    Prostate cancer Father    Hypertension Father    Diabetes Brother    Kidney disease Neg Hx    Family Psychiatric  History:  Social History:  Social History   Substance and Sexual Activity  Alcohol Use Not Currently     Social History   Substance and Sexual Activity  Drug Use No    Social History   Socioeconomic History   Marital status: Married    Spouse name: John [name]   Number of children: 2   Years of education: Not on file   Highest education level: Not on file  Occupational History  Not on file  Tobacco Use   Smoking status: Never   Smokeless tobacco: Never  Vaping Use   Vaping Use: Never used  Substance and Sexual Activity   Alcohol use: Not Currently   Drug use: No   Sexual activity: Yes    Birth control/protection: None  Other Topics Concern   Not on file  Social History Narrative   Live at home with wife.    Social Determinants of Health   Financial Resource Strain: Not on file  Food Insecurity: Not on file  Transportation Needs: Not on file  Physical Activity: Not on file  Stress: Not on file  Social Connections: Not on file   Additional Social History:                         Sleep: Good  Appetite:  Fair  Current Medications: Current  Facility-Administered Medications  Medication Dose Route Frequency Provider Last Rate Last Admin   acetaminophen (TYLENOL) tablet 650 mg  650 mg Oral Q6H PRN Clapacs, [name], MD   650 mg at 04/06/22 0934   alum & mag hydroxide-simeth (MAALOX/MYLANTA) 200-200-20 MG/5ML suspension 30 mL  30 mL Oral Q4H PRN Clapacs, [name], MD       amLODipine (NORVASC) tablet 10 mg  10 mg Oral Daily Clapacs, [name], MD   10 mg at 04/06/22 0160   aspirin EC tablet 81 mg  81 mg Oral Daily Clapacs, [name], MD   81 mg at 04/06/22 0935   atorvastatin (LIPITOR) tablet 10 mg  10 mg Oral Daily Clapacs, [name], MD   10 mg at 04/06/22 0934   buPROPion (WELLBUTRIN XL) 24 hr tablet 150 mg  150 mg Oral Daily Clapacs, [name], MD   150 mg at 04/06/22 0935   LORazepam (ATIVAN) injection 1 mg  1 mg Intramuscular Q6H Clapacs, John T, MD   1 mg at 04/06/22 0935   LORazepam (ATIVAN) tablet 1 mg  1 mg Oral Q4H PRN Clapacs, [name], MD       losartan (COZAAR) tablet 100 mg  100 mg Oral Daily Clapacs, John T, MD   100 mg at 04/06/22 0934   magnesium hydroxide (MILK OF MAGNESIA) suspension 30 mL  30 mL Oral Daily PRN Clapacs, [name], MD       metoprolol tartrate (LOPRESSOR) injection 2.5-5 mg  2.5-5 mg Intravenous Q2H PRN Clapacs, [name], MD       metoprolol tartrate (LOPRESSOR) tablet 25 mg  25 mg Oral BID Clapacs, [name], MD   25 mg at 04/06/22 0935   midazolam (VERSED) injection 2 mg  2 mg Intravenous Once Clapacs, [name], MD       traZODone (DESYREL) tablet 100 mg  100 mg Oral QHS PRN Clapacs, [name], MD   100 mg at 04/05/22 0027    Lab Results:  Results for orders placed or performed during the hospital encounter of 04/04/22 (from the past 48 hour(s))  Basic metabolic panel     Status: Abnormal   Collection Time: 04/05/22  7:24 AM  Result Value Ref Range   Sodium 140 135 - 145 mmol/L   Potassium 3.4 (L) 3.5 - 5.1 mmol/L   Chloride 108 98 - 111 mmol/L   CO2 19 (L) 22 - 32 mmol/L   Glucose, Bld 162 (H) 70 - 99 mg/dL    Comment:  Glucose reference range applies only to samples taken after fasting for  at least 8 hours.   BUN 26 (H) 8 - 23 mg/dL   Creatinine, Ser 1.43 (H) 0.61 - 1.24 mg/dL   Calcium 9.1 8.9 - 10.3 mg/dL   GFR, Estimated 50 (L) >60 mL/min    Comment: (NOTE) Calculated using the CKD-EPI Creatinine Equation (2021)    Anion gap 13 5 - 15    Comment: Performed at [facility], [address] [place], [place] [address]  CBC     Status: Abnormal   Collection Time: 04/05/22  7:24 AM  Result Value Ref Range   WBC 7.7 4.0 - 10.5 K/uL   RBC 6.19 (H) 4.22 - 5.81 MIL/uL   Hemoglobin 19.6 (H) 13.0 - 17.0 g/dL   HCT 58.8 (H) 39.0 - 52.0 %   MCV 95.0 80.0 - 100.0 fL   MCH 31.7 26.0 - 34.0 pg   MCHC 33.3 30.0 - 36.0 g/dL   RDW 13.3 11.5 - 15.5 %   Platelets 247 150 - 400 K/uL   nRBC 0.0 0.0 - 0.2 %    Comment: Performed at [facility], [address], [place], [place] [address]  Basic metabolic panel     Status: Abnormal   Collection Time: 04/06/22  7:36 AM  Result Value Ref Range   Sodium 140 135 - 145 mmol/L   Potassium 4.2 3.5 - 5.1 mmol/L   Chloride 112 (H) 98 - 111 mmol/L   CO2 25 22 - 32 mmol/L   Glucose, Bld 115 (H) 70 - 99 mg/dL    Comment: Glucose reference range applies only to samples taken after fasting for at least 8 hours.   BUN 38 (H) 8 - 23 mg/dL   Creatinine, Ser 1.12 0.61 - 1.24 mg/dL   Calcium 9.2 8.9 - 10.3 mg/dL   GFR, Estimated >60 >60 mL/min    Comment: (NOTE) Calculated using the CKD-EPI Creatinine Equation (2021)    Anion gap 3 (L) 5 - 15    Comment: Performed at [facility], [address] [place], [place] [address]    Blood Alcohol level:  Lab Results  Component Value Date   [facility] <10 88/41/6606    Metabolic Disorder Labs: Lab Results  Component Value Date   HGBA1C 5.6 03/19/2022   MPG 114.02 03/19/2022   No results found for: "PROLACTIN" Lab Results  Component Value Date   CHOL 131 03/19/2022   TRIG 92 03/19/2022   HDL 33  (L) 03/19/2022   CHOLHDL 4.0 03/19/2022   VLDL 18 03/19/2022   LDLCALC 80 03/19/2022    Physical Findings: AIMS:  , ,  ,  ,    CIWA:    COWS:     Musculoskeletal: Strength & Muscle Tone: decreased Gait & Station: unsteady Patient leans: Front  Psychiatric Specialty Exam:  Presentation  General Appearance: Appropriate for Environment  Eye Contact:Good  Speech:Clear and Coherent  Speech Volume:Normal  Handedness:No data recorded  Mood and Affect  Mood:Depressed  Affect:Congruent   Thought Process  Thought Processes:Coherent  Descriptions of Associations:Intact  Orientation:Full (Time, Place and Person)  Thought Content:Logical  History of Schizophrenia/Schizoaffective disorder:No  Duration of Psychotic Symptoms:No data recorded Hallucinations:No data recorded Ideas of Reference:None  Suicidal Thoughts:No data recorded Homicidal Thoughts:No data recorded  Sensorium  Memory:Immediate Good; Recent Good; Remote Good  Judgment:No data recorded Insight:Good   Executive Functions  Concentration:Good  Attention Span:Good  [place]  Language:Good   Psychomotor Activity  Psychomotor Activity:No data recorded  Assets  Assets:Desire  for Improvement; Communication Skills; Financial Resources/Insurance; Housing; Intimacy; Social Support; Resilience; Physical Health   Sleep  Sleep:No data recorded   Physical Exam: Physical Exam Vitals and nursing note reviewed.  Constitutional:      Appearance: Normal appearance. He is normal weight.  HENT:     Head: Normocephalic and atraumatic.     Right Ear: External ear normal.     Left Ear: External ear normal.     Nose: Nose normal.     Mouth/Throat:     Mouth: Mucous membranes are moist.     Pharynx: Oropharynx is clear.  Eyes:     Extraocular Movements: Extraocular movements intact.     Conjunctiva/sclera: Conjunctivae normal.     Pupils: Pupils are equal, round, and  reactive to light.  Cardiovascular:     Pulses: Normal pulses.     Heart sounds: Normal heart sounds.  Pulmonary:     Effort: Pulmonary effort is normal.     Breath sounds: Normal breath sounds.  Abdominal:     General: Abdomen is flat. Bowel sounds are normal.     Palpations: Abdomen is soft.  Musculoskeletal:        General: Normal range of motion.     Cervical back: Normal range of motion and neck supple.  Skin:    General: Skin is warm and dry.  Neurological:     General: No focal deficit present.     Mental Status: He is alert.    Review of Systems  Constitutional: Negative.   HENT: Negative.    Eyes: Negative.   Respiratory: Negative.    Cardiovascular: Negative.   Gastrointestinal: Negative.   Genitourinary: Negative.   Musculoskeletal:  Positive for joint pain.  Skin: Negative.   Neurological: Negative.   Endo/Heme/Allergies: Negative.   Psychiatric/Behavioral:  Positive for depression. The patient is nervous/anxious.    Blood pressure 118/75, pulse 62, temperature 97.8 F (36.6 C), resp. rate 18, height '6\' 2"'$  (1.88 m), weight 86.4 kg, SpO2 97 %. Body mass index is 24.46 kg/m.   Treatment Plan Summary: Daily contact with patient to assess and evaluate symptoms and progress in treatment, Medication management, and Plan : Continue current meds and treatment.   Barrington Worley 04/06/2022, 12:12 PM

## 2022-04-06 NOTE — Plan of Care (Signed)
  Problem: Safety: Goal: Ability to remain free from injury will improve Outcome: Progressing   Problem: Education: Goal: Verbalization of understanding the information provided will improve Outcome: Progressing   Problem: Health Behavior/Discharge Planning: Goal: Compliance with treatment plan for underlying cause of condition will improve Outcome: Progressing

## 2022-04-06 NOTE — Assessment & Plan Note (Signed)
We will hydrate patient with normal saline Encourage oral fluid intake

## 2022-04-06 NOTE — Assessment & Plan Note (Signed)
Blood pressure within goal. -Continue metoprolol

## 2022-04-06 NOTE — Plan of Care (Signed)

## 2022-04-06 NOTE — Anesthesia Preprocedure Evaluation (Addendum)
Anesthesia Evaluation  Patient identified by MRN, date of birth, ID band Patient awake    Reviewed: Allergy & Precautions, H&P , NPO status , Patient's Chart, lab work & pertinent test results, reviewed documented beta blocker date and time   History of Anesthesia Complications (+) PONV and history of anesthetic complications  Airway Mallampati: II   Neck ROM: full    Dental  (+) Poor Dentition   Pulmonary sleep apnea ,    Pulmonary exam normal        Cardiovascular Exercise Tolerance: Poor hypertension, On Medications Normal cardiovascular exam+ dysrhythmias (Paroxysmal atrial fibrillation)  Rhythm:regular Rate:Normal     Neuro/Psych  Headaches, PSYCHIATRIC DISORDERS Depression Severe recurrent major depression without psychotic featureshistory of subdural hematoma    GI/Hepatic negative GI ROS, Neg liver ROS,   Endo/Other  diabetes, Well Controlled, Type 2, Oral Hypoglycemic Agents  Renal/GU negative Renal ROS  negative genitourinary   Musculoskeletal   Abdominal   Peds  Hematology negative hematology ROS (+)   Anesthesia Other Findings displaced fracture in the neck of right femur after unwitnessed fall  Past Medical History: No date: Cancer ([place]) No date: Depression No date: Depression No date: Diabetes mellitus without complication (HCC)     Comment:  type 2 08/31/2015: Dissection of carotid artery (HCC) No date: Eczema No date: Elevated PSA No date: 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, congenital bilateral: Hearing loss No date: Hemorrhoids 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 No date: Hypertension No date: Migraine No date: Obesity No date: PONV (postoperative nausea and vomiting) 2020: Prostate cancer ([place]) No date: Rosacea No date: Sleep apnea Past Surgical History: No date: ANKLE ARTHROSCOPY; Left     Comment:  fracture 2007: CHOLECYSTECTOMY 1958: CRANIOTOMY     Comment:  left  frontal craniotomy for epidural hematoma 01/02/2022: INSERTION OF MESH     Comment:  Procedure: INSERTION OF MESH;  Surgeon: Herbert Pun, MD;  Location: [facility] ORS;  Service: General;; No date: LIGAMENT REPAIR; Right     Comment:  thumb No date: RETINAL DETACHMENT SURGERY 2006: TREATMENT FISTULA ANAL BMI    Body Mass Index: 28.31 kg/m     Reproductive/Obstetrics negative OB ROS                             Anesthesia Physical  Anesthesia Plan  ASA: 3  Anesthesia Plan: Spinal   Post-op Pain Management:    Induction: Intravenous  PONV Risk Score and Plan: 2 and Propofol infusion and Treatment may vary due to age or medical condition  Airway Management Planned: Natural Airway and Simple Face Mask  Additional Equipment:   Intra-op Plan:   Post-operative Plan:   Informed Consent: I have reviewed the patients History and Physical, chart, labs and discussed the procedure including the risks, benefits and alternatives for the proposed anesthesia with the patient or authorized representative who has indicated his/her understanding and acceptance.     Dental Advisory Given  Plan Discussed with: CRNA  Anesthesia Plan Comments:        Anesthesia Quick Evaluation

## 2022-04-06 NOTE — [facility] Suicide Risk Assessment (Addendum)
[facility] Discharge Suicide Risk Assessment   Principal Problem: Severe recurrent major depression without psychotic features [facility]) Discharge Diagnoses: Principal Problem:   Severe recurrent major depression without psychotic features ([place]) Active Problems:   BP (high blood pressure)   Dehydration   Total Time spent with patient: 45 minutes  Musculoskeletal: Strength & Muscle Tone: decreased Gait & Station: unable to stand Patient leans: N/A  Psychiatric Specialty Exam: Physical Exam Vitals and nursing note reviewed.  Constitutional:      Appearance: Normal appearance.  HENT:     Head: Normocephalic.     Nose: Nose normal.  Pulmonary:     Effort: Pulmonary effort is normal.  Musculoskeletal:     Cervical back: Normal range of motion.  Neurological:     General: No focal deficit present.     Mental Status: He is alert and oriented to person, place, and time.  Psychiatric:        Attention and Perception: Attention and perception normal.        Mood and Affect: Mood is anxious and depressed.        Speech: Speech normal.        Behavior: Behavior normal. Behavior is cooperative.        Thought Content: Thought content normal.        Cognition and Memory: Cognition and memory normal.        Judgment: Judgment normal.     Review of Systems  Musculoskeletal:        Hip pain  Psychiatric/Behavioral:  Positive for depression. The patient is nervous/anxious.   All other systems reviewed and are negative.   Blood pressure 127/71, pulse 68, temperature 98.3 F (36.8 C), temperature source Oral, resp. rate 18, height '6\' 2"'$  (1.88 m), weight 86.4 kg, SpO2 96 %.Body mass index is 24.46 kg/m.  General Appearance: Casual  Eye Contact:  Good  Speech:  Normal Rate  Volume:  Normal  Mood:  Anxious and Depressed  Affect:  Congruent  Thought Process:  Coherent and Descriptions of Associations: Intact  Orientation:  Full (Time, Place, and Person)  Thought Content:  WDL and Logical   Suicidal Thoughts:  No  Homicidal Thoughts:  No  Memory:  Immediate;   Good Recent;   Good Remote;   Good  Judgement:  Fair  Insight:  Fair  Psychomotor Activity:  Decreased  Concentration:  Concentration: Fair and Attention Span: Fair  Recall:  Good  Fund of Knowledge:  Good  Language:  Good  Akathisia:  No  Handed:  Right  AIMS (if indicated):     Assets:  Housing Leisure Time Resilience  ADL's:  Intact  Cognition:  WNL  Sleep:         Physical Exam: Physical Exam Vitals and nursing note reviewed.  Constitutional:      Appearance: Normal appearance.  HENT:     Head: Normocephalic.     Nose: Nose normal.  Pulmonary:     Effort: Pulmonary effort is normal.  Musculoskeletal:     Cervical back: Normal range of motion.  Neurological:     General: No focal deficit present.     Mental Status: He is alert and oriented to person, place, and time.  Psychiatric:        Attention and Perception: Attention and perception normal.        Mood and Affect: Mood is anxious and depressed.        Speech: Speech normal.  Behavior: Behavior normal. Behavior is cooperative.        Thought Content: Thought content normal.        Cognition and Memory: Cognition and memory normal.        Judgment: Judgment normal.    Review of Systems  Musculoskeletal:        Hip pain  Psychiatric/Behavioral:  Positive for depression. The patient is nervous/anxious.   All other systems reviewed and are negative.  Blood pressure 127/71, pulse 68, temperature 98.3 F (36.8 C), temperature source Oral, resp. rate 18, height '6\' 2"'$  (1.88 m), weight 86.4 kg, SpO2 96 %. Body mass index is 24.46 kg/m.  Mental Status Per Nursing Assessment::   On Admission:  NA  Demographic Factors:  Male, Age 53 or older, and Caucasian  Loss Factors: Decline in physical health  Historical Factors: NA  Risk Reduction Factors:   Sense of responsibility to family and Positive social support  Continued  Clinical Symptoms:  Depression and anxiety, moderate to high  Cognitive Features That Contribute To Risk:  None    Suicide Risk:  Minimal: No identifiable suicidal ideation.  Patients presenting with no risk factors but with morbid ruminations; may be classified as minimal risk based on the severity of the depressive symptoms    Plan Of Care/Follow-up recommendations:  Activity:  as tolerated Diet:  heart healthy diet Major depressive disorder, recurrent, severe without psychosis: Continue Wellbutrin 150 mg daily   Anxiety: Continue Ativan 1 mg every 4 hours PRN   Insomnia: Trazodone 100 mg daily at bedtime   Transfer to orthopedics due to fractured hip  Waylan Boga, NP 04/06/2022, 3:09 PM

## 2022-04-06 NOTE — Progress Notes (Signed)
Patient A&Ox4. Patient is cooperative and ate breakfast. Able to fill out menu this morning. Reviewed medications with patient and patient is med compliant. Patient verbalized this morning that he had a fall last night but did not tell anyone and mentioned that he tripped. Very unsteady gait noted this morning and patient has been assisted while ambulating. Pt stated R hip/groin area hurting and area was assessed. No bruises or swelling noted. Patient given tylenol po prn which provided some relief. Patient currently in dayroom resting in geri-chair. Denies SI/HI and A/V/H.

## 2022-04-06 NOTE — H&P (Signed)
History and Physical    Patient: John [name] CZY:[id number] DOB: 11/13/1940 DOA: 04/06/2022 DOS: the patient was seen and examined on 04/06/2022 PCP: Juluis Pitch, MD  Patient coming from: Home  Chief Complaint: No chief complaint on file.  HPI: John [name] is a 81 y.o. male with medical history significant for paroxysmal A-fib, depression, diabetes mellitus, history of subdural hematoma, hypertension who was admitted to the [facility] psych unit for management of depression for which she has been getting ECT. Patient states that he got up to use the bathroom last night and fell.  He denies feeling dizzy or lightheaded and states that he thinks he tripped and fell.  He got back in bed and this fall was unwitnessed.  This morning he started complaining of pain in the right hip and groin area which he rated a 7 x 10 in intensity at its worst.  He has been unable to bear weight on his right leg and was moved from his room in a recliner. X-ray showed displaced fracture in the neck of right femur. He denies having any chest pain, no shortness of breath, no nausea, no vomiting, no headache, no dizziness, no lightheadedness, no abdominal pain, no changes in his bowel habits, no fever, no urinary symptoms. He is unable to tell me if he hit his head when he fell.  Review of Systems: As mentioned in the history of present illness. All other systems reviewed and are negative. 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 06/2015 CT Head: abnl thickening of mid-dist R cervical ICA w/o narrowing - ? vasculitis and thrombosed/healing dissection; b. 01/2021 Carotid U/S: 1-39% bilat ICA stenoses.   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 Age 42 - struck in head w/ baseball --> s/p craniotomy.   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 Approx 2015 - eval in [place] - conservatively managed.   Past Surgical History:  Procedure Laterality Date   ANKLE ARTHROSCOPY Left    fracture   CHOLECYSTECTOMY  2007   CRANIOTOMY  1958   left frontal craniotomy for epidural hematoma   INSERTION OF MESH  01/02/2022   Procedure: INSERTION OF MESH;  Surgeon: Herbert Pun, MD;  Location: [facility] ORS;  Service: General;;   LIGAMENT REPAIR Right    thumb   RETINAL DETACHMENT SURGERY     TREATMENT FISTULA ANAL  2006   Social History:  reports that he has never smoked. He has never used smokeless tobacco. He reports that he does not currently use alcohol. He reports that he does not use drugs.  No Known Allergies  Family History  Problem Relation Age of Onset   Osteoporosis Mother    Depression Mother    Prostate cancer Father    Hypertension Father    Diabetes Brother    Kidney  disease Neg Hx     Prior to Admission medications   Medication Sig Start Date End Date Taking? Authorizing Provider  aspirin EC 81 MG tablet Take 81 mg by mouth daily.    [provider]  buPROPion (WELLBUTRIN XL) 150 MG 24 hr tablet Take 1 tablet (150 mg total) by mouth daily. 04/07/22   Patrecia Pour, NP  LORazepam (ATIVAN) 1 MG tablet Take 1 tablet (1 mg total) by mouth every 4 (four) hours as needed for anxiety, sedation or sleep. 04/04/22   Loletha Grayer, MD  metoprolol tartrate (LOPRESSOR) 25 MG tablet Take 1 tablet (25 mg total) by mouth 2 (two) times daily. 04/06/22 05/06/22  Patrecia Pour, NP  traZODone (DESYREL) 100 MG tablet Take 1 tablet (100 mg total) by mouth at bedtime as needed for sleep. 04/06/22 05/06/22  Patrecia Pour, NP     Physical Exam: There were no vitals filed for this visit. Physical Exam Vitals and nursing note reviewed.  Constitutional:      Appearance: Normal appearance.     Comments: Responds appropriately but slow  HENT:     Head: Normocephalic and atraumatic.     Nose: Nose normal.     Mouth/Throat:     Mouth: Mucous membranes are dry.  Eyes:     Pupils: Pupils are equal, round, and reactive to light.  Cardiovascular:     Rate and Rhythm: Normal rate.  Pulmonary:     Effort: Pulmonary effort is normal.     Breath sounds: Normal breath sounds.  Abdominal:     General: Abdomen is flat. Bowel sounds are normal.     Palpations: Abdomen is soft.  Musculoskeletal:     Cervical back: Normal range of motion and neck supple.     Comments: Decreased range of motion right hip  Skin:    General: Skin is warm and dry.  Neurological:     Mental Status: He is alert and oriented to person, place, and time.     Motor: Weakness present.  Psychiatric:     Comments: Depressed mood.  Flat affect.     Data Reviewed: Relevant notes from primary care and specialist visits, past discharge summaries as available in EHR, including Care Everywhere. Prior diagnostic testing as pertinent to current admission diagnoses Updated medications and problem lists for reconciliation ED course, including vitals, labs, imaging, treatment and response to treatment Triage notes, nursing and pharmacy notes and ED provider's notes Notable results as noted in HPI Labs reviewed.  Sodium 140, potassium 4.2, chloride 112, bicarb 25, glucose 115, BUN 38, creatinine 1.12, calcium 9.2, white count 7.7, hemoglobin 19.6, hematocrit 58.8, platelet count 247 Right hip x-ray shows There is displaced fracture in the neck of right femur. There are no new results to review at this time.  Assessment and Plan: * Fracture of femoral neck (HCC) Status post mechanical fall with displaced fracture of right femoral neck. Rule out  pathologic fracture since patient has a history of prostate cancer Immobilize right lower extremity Pain control Place patient on muscle relaxants Consult orthopedic surgery  Essential hypertension Blood pressure is stable Continue metoprolol  Paroxysmal atrial fibrillation (HCC) Currently in sinus rhythm Continue metoprolol for rate control Not a candidate for anticoagulation due to history of spontaneous subdural hematoma Hold aspirin for planned procedure  Severe recurrent major depression without psychotic features [facility]) Patient admitted to the [facility] psych unit for management of his depression and was receiving ECT therapy. Continue bupropion Consults psychiatry  Dehydration We will hydrate patient with normal saline Encourage oral fluid intake  Type 2 diabetes mellitus with hyperlipidemia (HCC) Continue consistent carbohydrate diet and check blood sugars with meals. Patient's last hemoglobin A1c was less than 6 We will place on sliding scale coverage if he develops hypoglycemia      Advance Care Planning:   Code Status: Full Code   Consults: Orthopedic surgery  Family Communication: Called and spoke to patient's wife Muath Hallam over the phone.  All questions and concerns have been addressed.  She verbalizes understanding and agrees with the plan.  Severity of Illness: The appropriate patient status for this patient is INPATIENT. Inpatient status is judged to be reasonable and necessary in order to provide the required intensity of service to ensure the patient's safety. The patient's presenting symptoms, physical exam findings, and initial radiographic and laboratory data in the context of their chronic comorbidities is felt to place them at high risk for further clinical deterioration. Furthermore, it is not anticipated that the patient will be medically stable for discharge from the hospital within 2 midnights of admission.   * I certify that at the point of  admission it is my clinical judgment that the patient will require inpatient hospital care spanning beyond 2 midnights from the point of admission due to high intensity of service, high risk for further deterioration and high frequency of surveillance required.*  Author: Collier Bullock, MD 04/06/2022 5:23 PM  For on call review [email or web address].

## 2022-04-06 NOTE — Assessment & Plan Note (Addendum)
Currently in sinus rhythm Continue metoprolol for rate control Not a candidate for anticoagulation due to history of spontaneous subdural hematoma -Can restart aspirin

## 2022-04-06 NOTE — Discharge Summary (Cosign Needed)
Physician Discharge Summary Note  Patient:  John [name] is an 81 y.o., male MRN:  [id number] DOB:  August 21, 1941 Patient phone:  [phone] (home)  Patient address:   [address] [place] [address],  Total Time spent with patient: 45 minutes  Date of Admission:  04/04/2022 Date of Discharge: 04/06/22  Reason for Admission:  depression, ECT  Principal Problem: Severe recurrent major depression without psychotic features [facility]) Discharge Diagnoses: Principal Problem:   Severe recurrent major depression without psychotic features ([place]) Active Problems:   BP (high blood pressure)   Dehydration   Past Psychiatric History: depression and anxiety  Past Medical History:  Past Medical History:  Diagnosis Date   Cancer ([place])    Depression    Depression    Diabetes mellitus without complication ([place])    type 2   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 06/2015 CT Head: abnl thickening of mid-dist R cervical ICA w/o narrowing - ? vasculitis and thrombosed/healing dissection; b. 01/2021 Carotid U/S: 1-39% bilat ICA stenoses.   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 Age 21 - struck in head w/ baseball --> s/p craniotomy.   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 Approx 2015 - eval in [place] - conservatively managed.    Past Surgical History:  Procedure Laterality Date   ANKLE ARTHROSCOPY Left    fracture   CHOLECYSTECTOMY  2007   [place]   left frontal craniotomy for epidural hematoma   INSERTION OF MESH  01/02/2022   Procedure: INSERTION OF MESH;  Surgeon: Herbert Pun, MD;  Location: [facility] ORS;  Service: General;;    LIGAMENT REPAIR Right    thumb   RETINAL DETACHMENT SURGERY     TREATMENT FISTULA ANAL  2006   Family History:  Family History  Problem Relation Age of Onset   Osteoporosis Mother    Depression Mother    Prostate cancer Father    Hypertension Father    Diabetes Brother    Kidney disease Neg Hx    Family Psychiatric  History: see above Social History:  Social History   Substance and Sexual Activity  Alcohol Use Not Currently     Social History   Substance and Sexual Activity  Drug Use No    Social History   Socioeconomic History   Marital status: Married    Spouse name: [name]   Number of children: 2   Years of education: Not on file   Highest education level: Not on file  Occupational History   Not on file  Tobacco Use   Smoking status: Never   Smokeless tobacco: Never  Vaping Use   Vaping Use: Never used  Substance and Sexual Activity   Alcohol use: Not Currently   Drug use: No   Sexual activity: Yes    Birth control/protection: None  Other Topics Concern   Not on file  Social History Narrative   Live at home with wife.    Social Determinants of Health  Financial Resource Strain: Not on file  Food Insecurity: Not on file  Transportation Needs: Not on file  Physical Activity: Not on file  Stress: Not on file  Social Connections: Not on file    Hospital Course:   On admission 04/05/22:: Patient seen and chart reviewed.  Patient was brought back down to the psychiatric ward from the medical service.  He had been there since Monday because of concern about an arrhythmia that was discovered prior to ECT on that day.  Yesterday he had seemed to be doing better mentally but today he is very withdrawn and seems worse.  Nursing reports he has been sitting in his chair with his eyes closed barely interacting all day.  Unable to eat unable to take oral medication.  On interview the patient is sitting with his eyes closed but rocking back and forth looking very  anxious and also as though we were in pain.  Cannot answer any open-ended questions.  Was able to tell me his name.  When asked where he was said [place] a jail but then was able to correct himself but not answer any other questions.  Labs today notable for his creatinine going up to 1.4 from 0.7 yesterday.  Continue with some other lab abnormalities.  Patient tells me he did not eat anything today because he is not hungry.  Medications: Wellbutrin 150 mg daily, Ativan 1 mg every 4 hours PRN, Trazodone 100 mg daily at bedtime PRN  04/06/22:  Patient is cooperative and ate breakfast. Able to fill out menu this morning. Reviewed medications with patient and patient is med compliant. Patient verbalized this morning that he had a fall last night but did not tell anyone and mentioned that he tripped. Very unsteady gait noted this morning and patient has been assisted while ambulating. Pt stated R hip/groin area hurting and area was assessed. No bruises or swelling noted. Patient given tylenol po prn which provided some relief. Patient currently in dayroom resting in geri-chair. Denies SI/HI and A/V/H. Patient has been sleeping in his recliner in dayroom today but is easily awakened. He reports ongoing depression and hip pain. He is denying current SI and has been sleeping well.   Client complained of hip pain, sent to xray, fractured hip results, transferred to orthopedics for care.    Musculoskeletal: Strength & Muscle Tone: decreased Gait & Station: unable to stand Patient leans: N/A  Psychiatric Specialty Exam: Physical Exam Vitals and nursing note reviewed.  Constitutional:      Appearance: Normal appearance.  HENT:     Head: Normocephalic.     Nose: Nose normal.  Pulmonary:     Effort: Pulmonary effort is normal.  Musculoskeletal:     Cervical back: Normal range of motion.  Neurological:     General: No focal deficit present.     Mental Status: He is alert and oriented to person, place,  and time.  Psychiatric:        Attention and Perception: Attention and perception normal.        Mood and Affect: Mood is anxious and depressed.        Speech: Speech normal.        Behavior: Behavior normal. Behavior is cooperative.        Thought Content: Thought content normal.        Cognition and Memory: Cognition and memory normal.        Judgment: Judgment normal.     Review of Systems  Musculoskeletal:        Hip pain   Psychiatric/Behavioral:  Positive for depression. The patient is nervous/anxious.   All other systems reviewed and are negative.   Blood pressure 127/71, pulse 68, temperature 98.3 F (36.8 C), temperature source Oral, resp. rate 18, height '6\' 2"'$  (1.88 m), weight 86.4 kg, SpO2 96 %.Body mass index is 24.46 kg/m.  General Appearance: Casual  Eye Contact:  Good  Speech:  Normal Rate  Volume:  Normal  Mood:  Anxious and Depressed  Affect:  Congruent  Thought Process:  Coherent and Descriptions of Associations: Intact  Orientation:  Full (Time, Place, and Person)  Thought Content:  Logical  Suicidal Thoughts:  No  Homicidal Thoughts:  No  Memory:  Immediate;   Fair Recent;   Fair Remote;   Fair  Judgement:  Fair  Insight:  Fair  Psychomotor Activity:  Decreased  Concentration:  Concentration: Fair and Attention Span: Fair  Recall:  [employer] of Knowledge:  Fair  Language:  Good  Akathisia:  No  Handed:  Right  AIMS (if indicated):     Assets:  Leisure Time Resilience  ADL's:  Intact  Cognition:  WNL  Sleep:          Physical Exam: Physical Exam Vitals and nursing note reviewed.  Constitutional:      Appearance: Normal appearance.  HENT:     Head: Normocephalic.     Nose: Nose normal.  Pulmonary:     Effort: Pulmonary effort is normal.  Musculoskeletal:     Cervical back: Normal range of motion.  Neurological:     General: No focal deficit present.     Mental Status: He is alert and oriented to person, place, and time.   Psychiatric:        Attention and Perception: Attention and perception normal.        Mood and Affect: Mood is anxious and depressed.        Speech: Speech normal.        Behavior: Behavior normal. Behavior is cooperative.        Thought Content: Thought content normal.        Cognition and Memory: Cognition and memory normal.        Judgment: Judgment normal.    Review of Systems  Musculoskeletal:        Hip pain   Psychiatric/Behavioral:  Positive for depression. The patient is nervous/anxious.   All other systems reviewed and are negative.  Blood pressure 127/71, pulse 68, temperature 98.3 F (36.8 C), temperature source Oral, resp. rate 18, height '6\' 2"'$  (1.88 m), weight 86.4 kg, SpO2 96 %. Body mass index is 24.46 kg/m.   Social History   Tobacco Use  Smoking Status Never  Smokeless Tobacco Never   Tobacco Cessation:  N/A, patient does not currently use tobacco products   Blood Alcohol level:  Lab Results  Component Value Date   ETH <10 70/26/3785    Metabolic Disorder Labs:  Lab Results  Component Value Date   HGBA1C 5.6 03/19/2022   MPG 114.02 03/19/2022   No results found for: "PROLACTIN" Lab Results  Component Value Date   CHOL 131 03/19/2022   TRIG 92 03/19/2022   HDL 33 (L) 03/19/2022   CHOLHDL 4.0 03/19/2022   VLDL 18 03/19/2022   [place] 80 03/19/2022    See Psychiatric Specialty Exam and Suicide Risk Assessment completed by Attending Physician prior to discharge.  Discharge destination:  Other:  ortho  floor  Is patient on multiple antipsychotic therapies at discharge:  No   Has Patient had three or more failed trials of antipsychotic monotherapy by history:  No  Recommended Plan for Multiple Antipsychotic Therapies: NA  Discharge Instructions     Diet - low sodium heart healthy   Complete by: As directed    Increase activity slowly   Complete by: As directed         Follow-up recommendations:  Activity:  as tolerated Diet:   heart healthy diet Major depressive disorder, recurrent, severe without psychosis: Continue Wellbutrin 150 mg daily  Anxiety: Continue Ativan 1 mg every 4 hours PRN  Insomnia: Trazodone 100 mg daily at bedtime PRN  Comments:  transfer to orthopedics for hip issues  Signed: Waylan Boga, NP 04/06/2022, 2:53 PM

## 2022-04-06 NOTE — Progress Notes (Signed)
Report given to Mardene Celeste RN at [facility]. Patient ate dinner and is now being transferred to 1A unit. Pt A&Ox4. No s/s of current distress. All valuables/belongings transferred with patient.

## 2022-04-06 NOTE — Assessment & Plan Note (Signed)
Patient admitted to the [facility] psych unit for management of his depression and was receiving ECT therapy. Continue bupropion Consults psychiatry

## 2022-04-06 NOTE — Progress Notes (Signed)
Patient left unit at 1:23 pm with nurse tech for a right hip x ray

## 2022-04-06 NOTE — Assessment & Plan Note (Addendum)
Status post mechanical fall with displaced fracture of right femoral neck. Rule out pathologic fracture since patient has a history of prostate cancer Immobilize right lower extremity S/p right hip arthroplasty by orthopedic surgery on 6/11, PT is recommending SNF. -Continue with pain management. -Continue with supportive care

## 2022-04-07 ENCOUNTER — Other Ambulatory Visit: Payer: Self-pay

## 2022-04-07 ENCOUNTER — Encounter
Admission: RE | Disposition: A | Discharge status: Skilled Nursing Facility | Payer: Self-pay | Source: Ambulatory Visit | Attending: Internal Medicine

## 2022-04-07 ENCOUNTER — Encounter: Payer: Self-pay | Admitting: Anesthesiology

## 2022-04-07 ENCOUNTER — Inpatient Hospital Stay: Payer: Medicare Other

## 2022-04-07 ENCOUNTER — Inpatient Hospital Stay: Payer: Self-pay | Admitting: Anesthesiology

## 2022-04-07 DIAGNOSIS — E860 Dehydration: Secondary | ICD-10-CM

## 2022-04-07 DIAGNOSIS — S72001A Fracture of unspecified part of neck of right femur, initial encounter for closed fracture: Secondary | ICD-10-CM | POA: Diagnosis not present

## 2022-04-07 DIAGNOSIS — I10 Essential (primary) hypertension: Secondary | ICD-10-CM | POA: Diagnosis not present

## 2022-04-07 DIAGNOSIS — I480 Paroxysmal atrial fibrillation: Secondary | ICD-10-CM | POA: Diagnosis not present

## 2022-04-07 HISTORY — PX: HIP ARTHROPLASTY: SHX981

## 2022-04-07 LAB — TYPE AND SCREEN
ABO/RH(D): O NEG
Antibody Screen: NEGATIVE

## 2022-04-07 LAB — CBC
HCT: 48.2 % (ref 39.0–52.0)
Hemoglobin: 15.8 g/dL (ref 13.0–17.0)
MCH: 31.4 pg (ref 26.0–34.0)
MCHC: 32.8 g/dL (ref 30.0–36.0)
MCV: 95.8 fL (ref 80.0–100.0)
Platelets: 149 10*3/uL — ABNORMAL LOW (ref 150–400)
RBC: 5.03 MIL/uL (ref 4.22–5.81)
RDW: 13.1 % (ref 11.5–15.5)
WBC: 12.8 10*3/uL — ABNORMAL HIGH (ref 4.0–10.5)
nRBC: 0 % (ref 0.0–0.2)

## 2022-04-07 LAB — CREATININE, SERUM
Creatinine, Ser: 0.81 mg/dL (ref 0.61–1.24)
GFR, Estimated: >60 mL/min (ref >60)

## 2022-04-07 LAB — GLUCOSE, CAPILLARY
Glucose-Capillary: 131 mg/dL — ABNORMAL HIGH (ref 70–99)
Glucose-Capillary: 182 mg/dL — ABNORMAL HIGH (ref 70–99)

## 2022-04-07 LAB — ABO/RH: ABO/RH(D): O NEG

## 2022-04-07 SURGERY — HEMIARTHROPLASTY, HIP, DIRECT ANTERIOR APPROACH, FOR FRACTURE
Anesthesia: General | Site: Hip | Laterality: Right

## 2022-04-07 MED ORDER — ACETAMINOPHEN 325 MG PO TABS: 325.0000 mg | ORAL_TABLET | Freq: Four times a day (QID) | ORAL | Status: DC | PRN

## 2022-04-07 MED ORDER — FENTANYL CITRATE (PF) 100 MCG/2ML IJ SOLN: 25.0000 ug | INTRAMUSCULAR | Status: DC | PRN

## 2022-04-07 MED ORDER — SODIUM CHLORIDE 0.9 % IV SOLN: INTRAVENOUS | Status: DC | PRN

## 2022-04-07 MED ORDER — FENTANYL CITRATE (PF) 100 MCG/2ML IJ SOLN
INTRAMUSCULAR | Status: AC
  Filled 2022-04-07: qty 2

## 2022-04-07 MED ORDER — PHENYLEPHRINE HCL-NACL 20-0.9 MG/250ML-% IV SOLN
INTRAVENOUS | Status: DC | PRN
  Administered 2022-04-07: 25 ug/min via INTRAVENOUS

## 2022-04-07 MED ORDER — MIDAZOLAM HCL 5 MG/5ML IJ SOLN
INTRAMUSCULAR | Status: DC | PRN
  Administered 2022-04-07 (×2): 1 mg via INTRAVENOUS

## 2022-04-07 MED ORDER — PROMETHAZINE HCL 25 MG/ML IJ SOLN: 6.2500 mg | INTRAMUSCULAR | Status: DC | PRN

## 2022-04-07 MED ORDER — MORPHINE SULFATE (PF) 2 MG/ML IV SOLN: 0.5000 mg | INTRAVENOUS | Status: DC | PRN

## 2022-04-07 MED ORDER — CEFAZOLIN SODIUM 1 G IJ SOLR
INTRAMUSCULAR | Status: AC
  Filled 2022-04-07: qty 10

## 2022-04-07 MED ORDER — NEOMYCIN-POLYMYXIN B GU 40-200000 IR SOLN
Status: AC
  Filled 2022-04-07: qty 20

## 2022-04-07 MED ORDER — HYDROCODONE-ACETAMINOPHEN 5-325 MG PO TABS
1.0000 | ORAL_TABLET | ORAL | Status: DC | PRN
  Administered 2022-04-07 (×2): 1 via ORAL
  Administered 2022-04-08: 2 via ORAL
  Administered 2022-04-08 – 2022-04-11 (×3): 1 via ORAL
  Filled 2022-04-07 (×3): qty 1
  Filled 2022-04-07: qty 2
  Filled 2022-04-07 (×2): qty 1

## 2022-04-07 MED ORDER — BUPIVACAINE-EPINEPHRINE (PF) 0.5% -1:200000 IJ SOLN
INTRAMUSCULAR | Status: AC
  Filled 2022-04-07: qty 30

## 2022-04-07 MED ORDER — PHENYLEPHRINE HCL (PRESSORS) 10 MG/ML IV SOLN
INTRAVENOUS | Status: AC
  Filled 2022-04-07: qty 1

## 2022-04-07 MED ORDER — PROPOFOL 500 MG/50ML IV EMUL
INTRAVENOUS | Status: DC | PRN
  Administered 2022-04-07: 50 ug/kg/min via INTRAVENOUS

## 2022-04-07 MED ORDER — ACETAMINOPHEN 10 MG/ML IV SOLN
INTRAVENOUS | Status: AC
  Filled 2022-04-07: qty 100

## 2022-04-07 MED ORDER — BUPIVACAINE-EPINEPHRINE (PF) 0.25% -1:200000 IJ SOLN
INTRAMUSCULAR | Status: AC
  Filled 2022-04-07: qty 30

## 2022-04-07 MED ORDER — GLYCOPYRROLATE 0.2 MG/ML IJ SOLN
INTRAMUSCULAR | Status: DC | PRN
  Administered 2022-04-07: .2 mg via INTRAVENOUS

## 2022-04-07 MED ORDER — BUPIVACAINE LIPOSOME 1.3 % IJ SUSP
INTRAMUSCULAR | Status: AC
  Filled 2022-04-07: qty 20

## 2022-04-07 MED ORDER — EPHEDRINE 5 MG/ML INJ
INTRAVENOUS | Status: AC
  Filled 2022-04-07: qty 5

## 2022-04-07 MED ORDER — ZOLPIDEM TARTRATE 5 MG PO TABS
5.0000 mg | ORAL_TABLET | Freq: Every evening | ORAL | Status: DC | PRN
  Administered 2022-04-07: 5 mg via ORAL
  Filled 2022-04-07: qty 1

## 2022-04-07 MED ORDER — ONDANSETRON HCL 4 MG/2ML IJ SOLN: 4.0000 mg | Freq: Four times a day (QID) | INTRAMUSCULAR | Status: DC | PRN

## 2022-04-07 MED ORDER — SODIUM CHLORIDE 0.45 % IV SOLN: INTRAVENOUS | Status: DC

## 2022-04-07 MED ORDER — ACETAMINOPHEN 10 MG/ML IV SOLN
1000.0000 mg | Freq: Once | INTRAVENOUS | Status: DC | PRN
Start: 2022-04-07 — End: 2022-04-07

## 2022-04-07 MED ORDER — ONDANSETRON HCL 4 MG PO TABS: 4.0000 mg | ORAL_TABLET | Freq: Four times a day (QID) | ORAL | Status: DC | PRN

## 2022-04-07 MED ORDER — ENOXAPARIN SODIUM 30 MG/0.3ML IJ SOSY
30.0000 mg | PREFILLED_SYRINGE | INTRAMUSCULAR | Status: DC
  Administered 2022-04-08 – 2022-04-11 (×4): 30 mg via SUBCUTANEOUS
  Filled 2022-04-07 (×4): qty 0.3

## 2022-04-07 MED ORDER — OXYCODONE HCL 5 MG/5ML PO SOLN: 5.0000 mg | Freq: Once | ORAL | Status: DC | PRN

## 2022-04-07 MED ORDER — PROPOFOL 1000 MG/100ML IV EMUL
INTRAVENOUS | Status: AC
  Filled 2022-04-07: qty 100

## 2022-04-07 MED ORDER — BUPIVACAINE-EPINEPHRINE 0.5% -1:200000 IJ SOLN
INTRAMUSCULAR | Status: DC | PRN
  Administered 2022-04-07: 30 mL

## 2022-04-07 MED ORDER — KETAMINE HCL 10 MG/ML IJ SOLN
INTRAMUSCULAR | Status: DC | PRN
  Administered 2022-04-07: 10 mg via INTRAVENOUS
  Administered 2022-04-07 (×2): 20 mg via INTRAVENOUS

## 2022-04-07 MED ORDER — FLEET ENEMA 7-19 GM/118ML RE ENEM: 1.0000 | ENEMA | Freq: Once | RECTAL | Status: DC | PRN

## 2022-04-07 MED ORDER — ACETAMINOPHEN 10 MG/ML IV SOLN
INTRAVENOUS | Status: DC | PRN
  Administered 2022-04-07: 1000 mg via INTRAVENOUS

## 2022-04-07 MED ORDER — TRANEXAMIC ACID-NACL 1000-0.7 MG/100ML-% IV SOLN
INTRAVENOUS | Status: AC
  Administered 2022-04-07: 1000 mg via INTRAVENOUS
  Filled 2022-04-07: qty 100

## 2022-04-07 MED ORDER — KETAMINE HCL 50 MG/5ML IJ SOSY
PREFILLED_SYRINGE | INTRAMUSCULAR | Status: AC
  Filled 2022-04-07: qty 5

## 2022-04-07 MED ORDER — FERROUS SULFATE 325 (65 FE) MG PO TABS
325.0000 mg | ORAL_TABLET | Freq: Every day | ORAL | Status: DC
  Administered 2022-04-08 – 2022-04-11 (×4): 325 mg via ORAL
  Filled 2022-04-07 (×5): qty 1

## 2022-04-07 MED ORDER — BISACODYL 10 MG RE SUPP
10.0000 mg | Freq: Every day | RECTAL | Status: DC | PRN
  Administered 2022-04-09: 10 mg via RECTAL
  Filled 2022-04-07: qty 1

## 2022-04-07 MED ORDER — BUPIVACAINE HCL (PF) 0.5 % IJ SOLN
INTRAMUSCULAR | Status: DC | PRN
  Administered 2022-04-07: 2.8 mL via INTRATHECAL

## 2022-04-07 MED ORDER — MIDAZOLAM HCL 2 MG/2ML IJ SOLN
INTRAMUSCULAR | Status: AC
  Filled 2022-04-07: qty 2

## 2022-04-07 MED ORDER — METOCLOPRAMIDE HCL 5 MG/ML IJ SOLN: 5.0000 mg | Freq: Three times a day (TID) | INTRAMUSCULAR | Status: DC | PRN

## 2022-04-07 MED ORDER — ALUM & MAG HYDROXIDE-SIMETH 200-200-20 MG/5ML PO SUSP: 30.0000 mL | ORAL | Status: DC | PRN

## 2022-04-07 MED ORDER — PHENOL 1.4 % MT LIQD: 1.0000 | OROMUCOSAL | Status: DC | PRN

## 2022-04-07 MED ORDER — SEVOFLURANE IN SOLN
RESPIRATORY_TRACT | Status: AC
  Filled 2022-04-07: qty 250

## 2022-04-07 MED ORDER — DOCUSATE SODIUM 100 MG PO CAPS
100.0000 mg | ORAL_CAPSULE | Freq: Two times a day (BID) | ORAL | Status: DC
  Administered 2022-04-07 – 2022-04-11 (×7): 100 mg via ORAL
  Filled 2022-04-07 (×9): qty 1

## 2022-04-07 MED ORDER — EPHEDRINE SULFATE (PRESSORS) 50 MG/ML IJ SOLN
INTRAMUSCULAR | Status: DC | PRN
  Administered 2022-04-07: 10 mg via INTRAVENOUS
  Administered 2022-04-07: 5 mg via INTRAVENOUS
  Administered 2022-04-07: 10 mg via INTRAVENOUS

## 2022-04-07 MED ORDER — BUPIVACAINE HCL (PF) 0.5 % IJ SOLN
INTRAMUSCULAR | Status: AC
  Filled 2022-04-07: qty 10

## 2022-04-07 MED ORDER — OXYCODONE HCL 5 MG PO TABS: 5.0000 mg | ORAL_TABLET | Freq: Once | ORAL | Status: DC | PRN

## 2022-04-07 MED ORDER — FENTANYL CITRATE (PF) 100 MCG/2ML IJ SOLN
INTRAMUSCULAR | Status: DC | PRN
Start: 2022-04-07 — End: 2022-04-07
  Administered 2022-04-07: 25 ug via INTRAVENOUS

## 2022-04-07 MED ORDER — CEFAZOLIN SODIUM-DEXTROSE 2-4 GM/100ML-% IV SOLN
2.0000 g | Freq: Three times a day (TID) | INTRAVENOUS | Status: AC
  Administered 2022-04-07 – 2022-04-08 (×3): 2 g via INTRAVENOUS
  Filled 2022-04-07 (×3): qty 100

## 2022-04-07 MED ORDER — 0.9 % SODIUM CHLORIDE (POUR BTL) OPTIME
TOPICAL | Status: DC | PRN
  Administered 2022-04-07: 1000 mL

## 2022-04-07 MED ORDER — TRANEXAMIC ACID-NACL 1000-0.7 MG/100ML-% IV SOLN: 1000.0000 mg | Freq: Once | INTRAVENOUS | Status: AC

## 2022-04-07 MED ORDER — MENTHOL 3 MG MT LOZG: 1.0000 | LOZENGE | OROMUCOSAL | Status: DC | PRN

## 2022-04-07 MED ORDER — METOCLOPRAMIDE HCL 5 MG PO TABS: 5.0000 mg | ORAL_TABLET | Freq: Three times a day (TID) | ORAL | Status: DC | PRN

## 2022-04-07 SURGICAL SUPPLY — 54 items
APL PRP STRL LF DISP 70% ISPRP (MISCELLANEOUS) ×2
BLADE SAGITTAL WIDE XTHICK NO (BLADE) ×2 IMPLANT
CHLORAPREP W/TINT 26 (MISCELLANEOUS) ×4 IMPLANT
COVER BACK TABLE REUSABLE LG (DRAPES) ×2 IMPLANT
DRAPE INCISE IOBAN 66X60 STRL (DRAPES) ×4 IMPLANT
DRSG MEPILEX SACRM 8.7X9.8 (GAUZE/BANDAGES/DRESSINGS) ×1 IMPLANT
ELECT CAUTERY BLADE 6.4 (BLADE) ×2 IMPLANT
ELECT REM PT RETURN 9FT ADLT (ELECTROSURGICAL) ×2
ELECTRODE REM PT RTRN 9FT ADLT (ELECTROSURGICAL) ×1 IMPLANT
GAUZE 4X4 16PLY ~~LOC~~+RFID DBL (SPONGE) ×2 IMPLANT
GAUZE SPONGE 4X4 12PLY STRL (GAUZE/BANDAGES/DRESSINGS) ×4 IMPLANT
GAUZE XEROFORM 1X8 LF (GAUZE/BANDAGES/DRESSINGS) ×4 IMPLANT
GLOVE SURG ORTHO 8.5 STRL (GLOVE) ×2 IMPLANT
GLOVE SURG UNDER LTX SZ8 (GLOVE) ×2 IMPLANT
GOWN STRL REUS W/ TWL LRG LVL3 (GOWN DISPOSABLE) ×2 IMPLANT
GOWN STRL REUS W/TWL LRG LVL3 (GOWN DISPOSABLE) ×4
GOWN STRL REUS W/TWL LRG LVL4 (GOWN DISPOSABLE) ×2 IMPLANT
HEAD MODULAR ENDO (Orthopedic Implant) ×2 IMPLANT
HEAD UNPLR 55XMDLR STRL HIP (Orthopedic Implant) IMPLANT
HEMOVAC 400CC 10FR (MISCELLANEOUS) ×2 IMPLANT
HOLSTER ELECTROSUGICAL PENCIL (MISCELLANEOUS) ×2 IMPLANT
IV NS 1000ML (IV SOLUTION) ×2
IV NS 1000ML BAXH (IV SOLUTION) ×1 IMPLANT
KIT TURNOVER KIT A (KITS) ×2 IMPLANT
MANIFOLD NEPTUNE II (INSTRUMENTS) ×2 IMPLANT
NDL FILTER BLUNT 18X1 1/2 (NEEDLE) ×1 IMPLANT
NDL SPNL 18GX3.5 QUINCKE PK (NEEDLE) ×2 IMPLANT
NEEDLE FILTER BLUNT 18X 1/2SAF (NEEDLE) ×1
NEEDLE FILTER BLUNT 18X1 1/2 (NEEDLE) ×1 IMPLANT
NEEDLE SPNL 18GX3.5 QUINCKE PK (NEEDLE) ×4 IMPLANT
NS IRRIG 1000ML POUR BTL (IV SOLUTION) ×2 IMPLANT
PACK HIP PROSTHESIS (MISCELLANEOUS) ×2 IMPLANT
PAD ABD DERMACEA PRESS 5X9 (GAUZE/BANDAGES/DRESSINGS) IMPLANT
PILLOW ABDUCTION MEDIUM (MISCELLANEOUS) ×2 IMPLANT
PULSAVAC PLUS IRRIG FAN TIP (DISPOSABLE) ×2
SLEEVE UNITRAX V40 (Orthopedic Implant) ×2 IMPLANT
SLEEVE UNITRAX V40 +4 (Orthopedic Implant) IMPLANT
SOL PREP PVP 2OZ (MISCELLANEOUS) ×2
SOLUTION PREP PVP 2OZ (MISCELLANEOUS) ×1 IMPLANT
SPONGE T-LAP 18X18 ~~LOC~~+RFID (SPONGE) ×8 IMPLANT
STAPLER SKIN PROX 35W (STAPLE) ×2 IMPLANT
STEM HIP 127 DEG (Stem) ×1 IMPLANT
SUT DVC 2 QUILL PDO  T11 36X36 (SUTURE) ×2
SUT DVC 2 QUILL PDO T11 36X36 (SUTURE) ×2 IMPLANT
SUT QUILL PDO 0 36 36 VIOLET (SUTURE) ×2 IMPLANT
SUT TICRON 2-0 30IN 311381 (SUTURE) ×5 IMPLANT
SYR 10ML LL (SYRINGE) ×2 IMPLANT
SYR 30ML LL (SYRINGE) ×2 IMPLANT
SYR 50ML LL SCALE MARK (SYRINGE) ×2 IMPLANT
TAPE MICROFOAM 4IN (TAPE) ×2 IMPLANT
TIP FAN IRRIG PULSAVAC PLUS (DISPOSABLE) ×1 IMPLANT
TUBE SUCT KAM VAC (TUBING) ×2 IMPLANT
WATER STERILE IRR 1000ML POUR (IV SOLUTION) ×2 IMPLANT
WATER STERILE IRR 500ML POUR (IV SOLUTION) ×2 IMPLANT

## 2022-04-07 NOTE — Anesthesia Postprocedure Evaluation (Signed)
Anesthesia Post Note  Patient: John [name]  Procedure(s) Performed: ARTHROPLASTY BIPOLAR HIP (HEMIARTHROPLASTY) (Right: Hip)  Patient location during evaluation: PACU Anesthesia Type: Spinal Level of consciousness: awake and alert Pain management: pain level controlled Vital Signs Assessment: post-procedure vital signs reviewed and stable Respiratory status: spontaneous breathing, nonlabored ventilation and respiratory function stable Cardiovascular status: blood pressure returned to baseline and stable Postop Assessment: no apparent nausea or vomiting Anesthetic complications: no   No notable events documented.   Last Vitals:  Vitals:   04/07/22 1139 04/07/22 1549  BP: 113/86 109/76  Pulse: (!) 57 64  Resp: 17 16  Temp: 36.4 C 36.5 C  SpO2: 99% 99%    Last Pain:  Vitals:   04/07/22 1217  TempSrc: Oral  PainSc: 0-No pain                 [place] Ouch

## 2022-04-07 NOTE — Plan of Care (Signed)
  Problem: Coping: Goal: Level of anxiety will decrease Outcome: Progressing   Problem: Pain Managment: Goal: General experience of comfort will improve Outcome: Progressing   

## 2022-04-07 NOTE — Transfer of Care (Signed)
Immediate Anesthesia Transfer of Care Note  Patient: John [name]  Procedure(s) Performed: ARTHROPLASTY BIPOLAR HIP (HEMIARTHROPLASTY) (Right: Hip)  Patient Location: PACU  Anesthesia Type:General and Spinal  Level of Consciousness: drowsy  Airway & Oxygen Therapy: Patient Spontanous Breathing  Post-op Assessment: Report given to RN and Post -op Vital signs reviewed and stable  Post vital signs: Reviewed and stable  Last Vitals:  Vitals Value Taken Time  BP 118/89 04/07/22 1034  Temp    Pulse 67 04/07/22 1038  Resp 15 04/07/22 1038  SpO2 91 % 04/07/22 1038  Vitals shown include unvalidated device data.  Last Pain:  Vitals:   04/07/22 0412  TempSrc: Oral  PainSc:          Complications: No notable events documented.

## 2022-04-07 NOTE — Op Note (Signed)
04/06/2022 - 04/07/2022  10:37 AM  PATIENT:  John [name]   MRN: [id number]  PRE-OPERATIVE DIAGNOSIS:  Displaced Subcapital fracture right hip   POST-OPERATIVE DIAGNOSIS: Same  PROCEDURE:  right   hip hemiarthroplasty with Stryker Accolade prosthesis  PREOPERATIVE INDICATIONS:  John [name] is an 81 y.o. male who was admitted 04/06/2022 with a diagnosis of displaced subcapital fracture of the hip and elected for surgical management.  The risks benefits and alternatives were discussed with the patient including but not limited to the risks of nonoperative treatment, versus surgical intervention including infection, bleeding, nerve injury, periprosthetic fracture, the need for revision surgery, dislocation, leg length discrepancy, blood clots, cardiopulmonary complications, morbidity, mortality, among others, and they were willing to proceed.  Predicted outcome is good, although there will be at least a six to nine month expected recovery. I discussed this with his wife as well pre operatively.    SURGEON:  Earnestine Leys, MD  ASST:Maurice Ronnald Ramp [facility]    ANESTHESIA: spinal    COMPLICATIONS:  None.   EBL:  50 cc    COMPONENTS:  Stryker Accolade Femoral Fracture stem size # 5  ,   and a size   55 mm  fracture head unipolar hip ball with    +4 mm  neck length.    PROCEDURE IN DETAIL: The patient was met in the holding area and identified.  The appropriate hip  was marked at the operative site. The patient was then transported to the OR and  placed under general anesthesia.  At that point, the patient was  placed in the lateral decubitus position with the operative side up and  secured to the operating room table and all bony prominences padded.     The operative lower extremity was prepped from the iliac crest to the toes.  Sterile draping was performed.  Time out was performed prior to incision.      A routine posterolateral approach was utilized via sharp dissection  carried down to the  subcutaneous tissue.  Gross bleeders were Bovie  coagulated.  The iliotibial band was identified and incised  along the length of the skin incision.  Self-retaining retractors were  inserted.  With the hip internally rotated, the short external rotators  were identified. The piriformis was tagged and the hip capsule released in a T-type fashion.  The femoral neck was exposed, and I resected the femoral neck using the appropriate jig. This was performed at approximately a thumb's breadth above the lesser trochanter.    I then exposed the deep acetabulum, cleared out any tissue including the ligamentum teres.    I then prepared the proximal femur using the cookie-cutter, the lateralizing reamer, and then sequentially broached.  A trial stem   was  utilized along with a unipolar head and neck.  I reduced the hip and it was found to have excellent stability with functional range of motion. Leg lengths were equal.  The trial components were then removed.   The same size Accolade femoral stem was then inserted and was very stable.  The Unitrax head and neck as trialed were inserted as well.     The hip was then reduced and taken through functional range of motion and found to have excellent stability. Leg lengths were restored.     I closed the T in the capsule with #2 Ticron as well as the short external rotators. A hemovac was inserted.    I then irrigated the hip copiously again with pulse  lavage, and repaired the fascia with #2 Quill and the subcutaneous layer with #0 Quill. Sponge and needle counts were correct. Dry sterile Aquacell was applied.   The patient was then awakened and returned to PACU in stable and satisfactory condition. There were no complications.  Park Breed, MD Orthopedic Surgeon [phone]   04/07/2022 10:37 AM

## 2022-04-07 NOTE — Progress Notes (Signed)
PROGRESS NOTE    John [name]  QBH:[id number] DOB: 1941-01-31 DOA: 04/06/2022 PCP: Juluis Pitch, MD    Brief Narrative:   John [name] is a 81 y.o. male with medical history significant for paroxysmal A-fib, depression, diabetes mellitus, history of subdural hematoma, hypertension who was admitted to the [facility] psych unit for management of depression for which she has been getting ECT. Patient states that he got up to use the bathroom last night and fell.  He denies feeling dizzy or lightheaded and states that he thinks he tripped and fell.  He got back in bed and this fall was unwitnessed.  This morning he started complaining of pain in the right hip and groin area which he rated a 7 x 10 in intensity at its worst.  He has been unable to bear weight on his right leg and was moved from his room in a recliner. X-ray showed displaced fracture in the neck of right femur. He denies having any chest pain, no shortness of breath, no nausea, no vomiting, no headache, no dizziness, no lightheadedness, no abdominal pain, no changes in his bowel habits, no fever, no urinary symptoms. He is unable to tell me if he hit his head when he fell.    Consultants:  orthopedics  Procedures:   Antimicrobials:      Subjective: Confused, as I saw him just after surgery . Denies sob or cp. No pain yet  Objective: Vitals:   04/07/22 1045 04/07/22 1100 04/07/22 1115 04/07/22 1139  BP: (!) 137/97 136/87 132/88 113/86  Pulse: 63 61 62 (!) 57  Resp: '13 12 17 17  '$ Temp:   98 F (36.7 C) 97.6 F (36.4 C)  TempSrc:      SpO2: 95% 97% 94% 99%    Intake/Output Summary (Last 24 hours) at 04/07/2022 1359 Last data filed at 04/07/2022 1114 Gross per 24 hour  Intake 850 ml  Output 1080 ml  Net -230 ml   There were no vitals filed for this visit.  Examination: Calm, NAD Cta no w/r Reg s1/s2 no gallop Soft benign +bs No edema Aaoxox3  Mood and affect appropriate in current setting     Data Reviewed:  I have personally reviewed following labs and imaging studies  CBC: Recent Labs  Lab 04/01/22 1539 04/04/22 0525 04/05/22 0724  WBC 5.8 7.2 7.7  HGB 18.1* 19.2* 19.6*  HCT 52.7* 54.8* 58.8*  MCV 90.2 90.1 95.0  PLT 178 200 097   Basic Metabolic Panel: Recent Labs  Lab 04/01/22 1539 04/02/22 1323 04/03/22 0313 04/04/22 0525 04/05/22 0724 04/06/22 0736  NA 139 139 141 139 140 140  K 3.5 3.6 4.3 3.5 3.4* 4.2  CL 107 107 108 113* 108 112*  CO2 21* 21* 28 17* 19* 25  GLUCOSE 129* 110* 103* 115* 162* 115*  BUN '22 19 21 20 '$ 26* 38*  CREATININE 0.95 0.84 0.97 0.71 1.43* 1.12  CALCIUM 9.4 9.4 9.1 9.1 9.1 9.2  MG 2.1  --  2.5*  --   --   --    GFR: Estimated Creatinine Clearance: 62.2 mL/min (by C-G formula based on SCr of 1.12 mg/dL). Liver Function Tests: No results for input(s): "AST", "ALT", "ALKPHOS", "BILITOT", "PROT", "ALBUMIN" in the last 168 hours. No results for input(s): "LIPASE", "AMYLASE" in the last 168 hours. No results for input(s): "AMMONIA" in the last 168 hours. Coagulation Profile: No results for input(s): "INR", "PROTIME" in the last 168 hours. Cardiac Enzymes: Recent Labs  Lab 04/02/22  1323  CKTOTAL 134   BNP (last 3 results) No results for input(s): "PROBNP" in the last 8760 hours. HbA1C: No results for input(s): "HGBA1C" in the last 72 hours. CBG: Recent Labs  Lab 04/04/22 0901 04/04/22 1132 04/04/22 1622 04/04/22 2115 04/07/22 1144  GLUCAP 225* 108* 130* 144* 131*   Lipid Profile: No results for input(s): "CHOL", "HDL", "LDLCALC", "TRIG", "CHOLHDL", "LDLDIRECT" in the last 72 hours. Thyroid Function Tests: No results for input(s): "TSH", "T4TOTAL", "FREET4", "T3FREE", "THYROIDAB" in the last 72 hours. Anemia Panel: No results for input(s): "VITAMINB12", "FOLATE", "FERRITIN", "TIBC", "IRON", "RETICCTPCT" in the last 72 hours. Sepsis Labs: No results for input(s): "PROCALCITON", "LATICACIDVEN" in the last 168 hours.  Recent Results  (from the past 240 hour(s))  SARS Coronavirus 2 by RT PCR (hospital order, performed in [facility] hospital lab) *cepheid single result test* Anterior Nasal Swab     Status: None   Collection Time: 04/04/22  3:08 PM   Specimen: Anterior Nasal Swab  Result Value Ref Range Status   SARS Coronavirus 2 by RT PCR NEGATIVE NEGATIVE Final    Comment: (NOTE) SARS-CoV-2 target nucleic acids are NOT DETECTED.  The SARS-CoV-2 RNA is generally detectable in upper and lower respiratory specimens during the acute phase of infection. The lowest concentration of SARS-CoV-2 viral copies this assay can detect is 250 copies / mL. A negative result does not preclude SARS-CoV-2 infection and should not be used as the sole basis for treatment or other patient management decisions.  A negative result may occur with improper specimen collection / handling, submission of specimen other than nasopharyngeal swab, presence of viral mutation(s) within the areas targeted by this assay, and inadequate number of viral copies (<250 copies / mL). A negative result must be combined with clinical observations, patient history, and epidemiological information.  Fact Sheet for Patients:   https://www.patel.info/  Fact Sheet for Healthcare Providers: https://hall.com/  This test is not yet approved or  cleared by the [place] FDA and has been authorized for detection and/or diagnosis of SARS-CoV-2 by FDA under an Emergency Use Authorization (EUA).  This EUA will remain in effect (meaning this test can be used) for the duration of the COVID-19 declaration under Section 564(b)(1) of the Act, 21 U.S.C. section 360bbb-3(b)(1), unless the authorization is terminated or revoked sooner.  Performed at [facility], [address] [place], [place] [address]          Radiology Studies: DG Hip Port Unilat With Pelvis 1V Right  Result Date: 04/07/2022 CLINICAL  DATA:  Postop right hemiarthroplasty. EXAM: DG HIP (WITH OR WITHOUT PELVIS) 1V PORT RIGHT COMPARISON:  04/06/2022. FINDINGS: Right hip hemiarthroplasty appears well seated and well-positioned. No acute fracture or evidence of an operative complication. IMPRESSION: Well-positioned right hip hemiarthroplasty. Electronically Signed   By: Lajean Manes M.D.   On: 04/07/2022 11:13   CT HEAD WO CONTRAST (5MM)  Result Date: 04/06/2022 CLINICAL DATA:  Status post trauma, altered mental status. EXAM: CT HEAD WITHOUT CONTRAST TECHNIQUE: Contiguous axial images were obtained from the base of the skull through the vertex without intravenous contrast. RADIATION DOSE REDUCTION: This exam was performed according to the departmental dose-optimization program which includes automated exposure control, adjustment of the mA and/or kV according to patient size and/or use of iterative reconstruction technique. COMPARISON:  Mar 25, 2022 FINDINGS: Brain: There is mild cerebral atrophy with widening of the extra-axial spaces and ventricular dilatation. There are areas of decreased attenuation within the white matter tracts  of the supratentorial brain, consistent with microvascular disease changes. Vascular: No hyperdense vessel or unexpected calcification. Skull: Normal. Negative for fracture or focal lesion. Sinuses/Orbits: A left frontal burr hole and partial craniotomy defect is seen. Other: None. IMPRESSION: 1. No acute intracranial abnormality. 2. Generalized cerebral atrophy with chronic white matter small vessel ischemic changes. Electronically Signed   By: Virgina Norfolk M.D.   On: 04/06/2022 18:25   Chest Portable 1 View  Result Date: 04/06/2022 CLINICAL DATA:  Short of breath. EXAM: PORTABLE CHEST 1 VIEW COMPARISON:  11/09/2021. FINDINGS: Cardiac silhouette normal in size.  No mediastinal or hilar masses. Clear lungs.  No pleural effusion or pneumothorax. Skeletal structures are grossly intact. IMPRESSION: No active  disease. Electronically Signed   By: Lajean Manes M.D.   On: 04/06/2022 18:05   DG HIP UNILAT WITH PELVIS 2-3 VIEWS RIGHT  Result Date: 04/06/2022 CLINICAL DATA:  Trauma, fall EXAM: DG HIP (WITH OR WITHOUT PELVIS) 2-3V RIGHT COMPARISON:  None Available. FINDINGS: Acute fracture is seen in the neck of right femur. There is 9 mm offset in the alignment of medial cortical margin of neck. There is no dislocation. Degenerative changes are noted in the visualized lower lumbar spine. Surgical clips are seen in the region of prostate. IMPRESSION: There is displaced fracture in the neck of right femur. Electronically Signed   By: Elmer Picker M.D.   On: 04/06/2022 13:50        Scheduled Meds:  buPROPion  150 mg Oral Daily   docusate sodium  100 mg Oral BID   [START ON 04/08/2022] enoxaparin (LOVENOX) injection  30 mg Subcutaneous Q24H   [START ON 04/08/2022] ferrous sulfate  325 mg Oral Q breakfast   metoprolol tartrate  25 mg Oral BID   senna  1 tablet Oral BID   Continuous Infusions:  sodium chloride 75 mL/hr at 04/07/22 1258   sodium chloride 75 mL/hr at 04/07/22 0819    ceFAZolin (ANCEF) IV     methocarbamol (ROBAXIN) IV      Assessment & Plan:   Principal Problem:   Fracture of femoral neck (HCC) Active Problems:   Essential hypertension   Paroxysmal atrial fibrillation (HCC)   Severe recurrent major depression without psychotic features ([place])   Type 2 diabetes mellitus with hyperlipidemia (HCC)   Dehydration  Fracture of femoral neck (HCC) Status post mechanical fall with displaced fracture of right femoral neck. Rule out pathologic fracture since patient has a history of prostate cancer 6/11Status post right hip arthroplasty  Bowel regimen Pain mx PT when cleared by orthopedics Ck am labs    Essential hypertension Stable continue beta-blockers   Paroxysmal atrial fibrillation (HCC) Currently in sinus rhythm Not a candidate for anticoagulation due to history of  spontaneous subdural hematoma Hold aspirin for planned procedure 6/11 continue with beta-blockers   Severe recurrent major depression without psychotic features [facility]) Patient admitted to the [facility] psych unit for management of his depression and was receiving ECT therapy. Continue bupropion 6/11 psychiatry was consulted-pending    Dehydration Received Ivf    Type 2 diabetes mellitus with hyperlipidemia ([place]) Continue consistent carbohydrate diet and check blood sugars with meals. Patient's last hemoglobin A1c was less than 6 We will place on sliding scale coverage if he develops hypoglycemia          DVT prophylaxis: Lovenox Code Status: Full Family Communication: None at bedside Disposition Plan:  Status is: Inpatient Remains inpatient appropriate because: Status post surgery today.  Needs PT  evaluation.  Likely may need SNF        LOS: 1 day   Time spent: 35 minutes    Nolberto Hanlon, MD [facility] Pager [phone]  If 7PM-7AM, please contact night-coverage 04/07/2022, 1:59 PM

## 2022-04-07 NOTE — H&P (Signed)
ORTHOPAEDIC CONSULTATION  REQUESTING PHYSICIAN: Nolberto Hanlon, MD  Chief Complaint: Right hip pain  HPI: John [name] is a 81 y.o. male who complains of right hip pain after a fall in the bathroom 2 nights ago.  The patient has been a inpatient on psychiatry for depression and has been getting ECT treatments the last week.  He fell in the bathroom the evening of 04/05/2022 and injured his right hip.  He was able to get back to bed and did not mention the incident until yesterday.  He was x-rayed and it was found that he had a displaced subcapital fracture of the right hip.  He was scheduled for surgery and cleared by the medical service.  I have spoken to his wife this morning and explained the procedure in detail and she agrees with proceeding with surgery.  Risks and benefits and postop protocol were discussed with the patient as well.  I have recommended a hemiarthroplasty since the subcapital fracture is displaced.  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 06/2015 CT Head: abnl thickening of mid-dist R cervical ICA w/o narrowing - ? vasculitis and thrombosed/healing dissection; b. 01/2021 Carotid U/S: 1-39% bilat ICA stenoses.   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 Age 14 - struck in head w/ baseball --> s/p craniotomy.   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 Approx 2015 - eval in [place] - conservatively managed.   Past Surgical History:  Procedure Laterality Date   ANKLE ARTHROSCOPY Left    fracture    CHOLECYSTECTOMY  2007   [place]   left frontal craniotomy for epidural hematoma   INSERTION OF MESH  01/02/2022   Procedure: INSERTION OF MESH;  Surgeon: Herbert Pun, MD;  Location: [facility] ORS;  Service: General;;   LIGAMENT REPAIR Right    thumb   RETINAL DETACHMENT SURGERY     TREATMENT FISTULA ANAL  2006   Social History   Socioeconomic History   Marital status: Married    Spouse name: [name]   Number of children: 2   Years of education: Not on file   Highest education level: Not on file  Occupational History   Not on file  Tobacco Use   Smoking status: Never   Smokeless tobacco: Never  Vaping Use   Vaping Use: Never used  Substance and Sexual Activity   Alcohol use: Not Currently   Drug use: No   Sexual activity: Yes    Birth control/protection: None  Other Topics Concern   Not on file  Social History Narrative   Live at home with wife.    Social Determinants of Health   Financial Resource Strain: Not on file  Food Insecurity: Not on file  Transportation Needs: Not on file  Physical Activity: Not on file  Stress: Not on  file  Social Connections: Not on file   Family History  Problem Relation Age of Onset   Osteoporosis Mother    Depression Mother    Prostate cancer Father    Hypertension Father    Diabetes Brother    Kidney disease Neg Hx    No Known Allergies Prior to Admission medications   Medication Sig Start Date End Date Taking? Authorizing Provider  aspirin EC 81 MG tablet Take 81 mg by mouth daily.    [provider]  buPROPion (WELLBUTRIN XL) 150 MG 24 hr tablet Take 1 tablet (150 mg total) by mouth daily. 04/07/22   Patrecia Pour, NP  LORazepam (ATIVAN) 1 MG tablet Take 1 tablet (1 mg total) by mouth every 4 (four) hours as needed for anxiety, sedation or sleep. 04/04/22   Loletha Grayer, MD  metoprolol tartrate (LOPRESSOR) 25 MG tablet Take 1 tablet (25 mg total) by mouth 2 (two) times daily. 04/06/22 05/06/22  Patrecia Pour, NP  traZODone (DESYREL) 100 MG tablet Take 1 tablet (100 mg total) by mouth at bedtime as needed for sleep. 04/06/22 05/06/22  Patrecia Pour, NP   CT HEAD WO CONTRAST (5MM)  Result Date: 04/06/2022 CLINICAL DATA:  Status post trauma, altered mental status. EXAM: CT HEAD WITHOUT CONTRAST TECHNIQUE: Contiguous axial images were obtained from the base of the skull through the vertex without intravenous contrast. RADIATION DOSE REDUCTION: This exam was performed according to the departmental dose-optimization program which includes automated exposure control, adjustment of the mA and/or kV according to patient size and/or use of iterative reconstruction technique. COMPARISON:  Mar 25, 2022 FINDINGS: Brain: There is mild cerebral atrophy with widening of the extra-axial spaces and ventricular dilatation. There are areas of decreased attenuation within the white matter tracts of the supratentorial brain, consistent with microvascular disease changes. Vascular: No hyperdense vessel or unexpected calcification. Skull: Normal. Negative for fracture or focal lesion. Sinuses/Orbits: A left frontal burr hole and partial craniotomy defect is seen. Other: None. IMPRESSION: 1. No acute intracranial abnormality. 2. Generalized cerebral atrophy with chronic white matter small vessel ischemic changes. Electronically Signed   By: Virgina Norfolk M.D.   On: 04/06/2022 18:25   Chest Portable 1 View  Result Date: 04/06/2022 CLINICAL DATA:  Short of breath. EXAM: PORTABLE CHEST 1 VIEW COMPARISON:  11/09/2021. FINDINGS: Cardiac silhouette normal in size.  No mediastinal or hilar masses. Clear lungs.  No pleural effusion or pneumothorax. Skeletal structures are grossly intact. IMPRESSION: No active disease. Electronically Signed   By: Lajean Manes M.D.   On: 04/06/2022 18:05   DG HIP UNILAT WITH PELVIS 2-3 VIEWS RIGHT  Result Date: 04/06/2022 CLINICAL DATA:  Trauma, fall EXAM: DG HIP (WITH OR WITHOUT PELVIS) 2-3V  RIGHT COMPARISON:  None Available. FINDINGS: Acute fracture is seen in the neck of right femur. There is 9 mm offset in the alignment of medial cortical margin of neck. There is no dislocation. Degenerative changes are noted in the visualized lower lumbar spine. Surgical clips are seen in the region of prostate. IMPRESSION: There is displaced fracture in the neck of right femur. Electronically Signed   By: Elmer Picker M.D.   On: 04/06/2022 13:50    Positive ROS: All other systems have been reviewed and were otherwise negative with the exception of those mentioned in the HPI and as above.  Physical Exam: General: Alert, no acute distress Cardiovascular: No pedal edema Respiratory: No cyanosis, no use of accessory musculature GI: No organomegaly,  abdomen is soft and non-tender Skin: No lesions in the area of chief complaint Neurologic: Sensation intact distally Psychiatric: Patient is competent for consent with normal mood and affect Lymphatic: No axillary or cervical lymphadenopathy  MUSCULOSKELETAL: Patient is awake and alert.  He seems oriented.  He is lying in his hospital bed.  The right leg is slightly shortened and rotated.  There is pain with movement of the hip.  Neurovascular status is good.  The skin is intact.  Left lower extremity is normal.  Both upper extremities and the spine are normal.  Assessment: Displaced subcapital fracture right hip  Plan: Right hip hemiarthroplasty    Park Breed, MD [phone]   04/07/2022 8:10 AM

## 2022-04-07 NOTE — Anesthesia Procedure Notes (Addendum)
Spinal  Patient location during procedure: OR Start time: 04/07/2022 8:21 AM End time: 04/07/2022 8:29 AM Reason for block: surgical anesthesia Staffing Performed: anesthesiologist  Anesthesiologist: Iran Ouch, MD Performed by: Lia Foyer, CRNA Authorized by: Iran Ouch, MD   Preanesthetic Checklist Completed: patient identified, IV checked, site marked, risks and benefits discussed, surgical consent, monitors and equipment checked, pre-op evaluation and timeout performed Spinal Block Patient position: left lateral decubitus Prep: DuraPrep Patient monitoring: heart rate, cardiac monitor, continuous pulse ox and blood pressure Approach: midline Location: L3-4 Injection technique: single-shot Needle Needle type: Pencan  Needle gauge: 24 G Needle length: 9 cm Assessment Sensory level: T10 Events: CSF return

## 2022-04-07 NOTE — H&P (Signed)
THE PATIENT WAS SEEN PRIOR TO SURGERY TODAY.  HISTORY, ALLERGIES, HOME MEDICATIONS AND OPERATIVE PROCEDURE WERE REVIEWED. RISKS AND BENEFITS OF SURGERY DISCUSSED WITH PATIENT AGAIN.  NO CHANGES FROM INITIAL HISTORY AND PHYSICAL NOTED.    

## 2022-04-08 ENCOUNTER — Encounter: Payer: Self-pay | Admitting: Anesthesiology

## 2022-04-08 ENCOUNTER — Encounter: Payer: Self-pay | Admitting: Specialist

## 2022-04-08 ENCOUNTER — Other Ambulatory Visit: Payer: Self-pay

## 2022-04-08 DIAGNOSIS — I480 Paroxysmal atrial fibrillation: Secondary | ICD-10-CM | POA: Diagnosis not present

## 2022-04-08 DIAGNOSIS — F332 Major depressive disorder, recurrent severe without psychotic features: Secondary | ICD-10-CM

## 2022-04-08 DIAGNOSIS — E860 Dehydration: Secondary | ICD-10-CM | POA: Diagnosis not present

## 2022-04-08 DIAGNOSIS — I10 Essential (primary) hypertension: Secondary | ICD-10-CM | POA: Diagnosis not present

## 2022-04-08 DIAGNOSIS — S72001A Fracture of unspecified part of neck of right femur, initial encounter for closed fracture: Secondary | ICD-10-CM | POA: Diagnosis not present

## 2022-04-08 LAB — CBC
HCT: 46.8 % (ref 39.0–52.0)
Hemoglobin: 16 g/dL (ref 13.0–17.0)
MCH: 31.5 pg (ref 26.0–34.0)
MCHC: 34.2 g/dL (ref 30.0–36.0)
MCV: 92.1 fL (ref 80.0–100.0)
Platelets: 154 10*3/uL (ref 150–400)
RBC: 5.08 MIL/uL (ref 4.22–5.81)
RDW: 13 % (ref 11.5–15.5)
WBC: 10 10*3/uL (ref 4.0–10.5)
nRBC: 0 % (ref 0.0–0.2)

## 2022-04-08 LAB — GLUCOSE, CAPILLARY
Glucose-Capillary: 107 mg/dL — ABNORMAL HIGH (ref 70–99)
Glucose-Capillary: 134 mg/dL — ABNORMAL HIGH (ref 70–99)
Glucose-Capillary: 161 mg/dL — ABNORMAL HIGH (ref 70–99)

## 2022-04-08 LAB — BASIC METABOLIC PANEL
Anion gap: 6 (ref 5–15)
BUN: 17 mg/dL (ref 8–23)
CO2: 20 mmol/L — ABNORMAL LOW (ref 22–32)
Calcium: 8.4 mg/dL — ABNORMAL LOW (ref 8.9–10.3)
Chloride: 110 mmol/L (ref 98–111)
Creatinine, Ser: 0.81 mg/dL (ref 0.61–1.24)
GFR, Estimated: >60 mL/min (ref >60)
Glucose, Bld: 145 mg/dL — ABNORMAL HIGH (ref 70–99)
Potassium: 3.7 mmol/L (ref 3.5–5.1)
Sodium: 136 mmol/L (ref 135–145)

## 2022-04-08 MED ORDER — SODIUM BICARBONATE 650 MG PO TABS
650.0000 mg | ORAL_TABLET | Freq: Once | ORAL | Status: AC
Start: 2022-04-08 — End: 2022-04-08
  Administered 2022-04-08: 650 mg via ORAL
  Filled 2022-04-08: qty 1

## 2022-04-08 NOTE — Plan of Care (Signed)

## 2022-04-08 NOTE — Progress Notes (Signed)
Subjective: 1 Day Post-Op Procedure(s) (LRB): ARTHROPLASTY BIPOLAR HIP (HEMIARTHROPLASTY) (Right)   Patient is awake and sitting up in bed this afternoon.  He was out of bed for 2 hours in a chair earlier.  He is much more alert and oriented today.  Pain in the hip is better.  Hemoglobin is 16 .0. Patient reports pain as mild.  Objective:   VITALS:   Vitals:   04/08/22 0734 04/08/22 1207  BP: 118/74 120/71  Pulse: 80 77  Resp: 18 20  Temp: 98.5 F (36.9 C) 98.7 F (37.1 C)  SpO2: 100% 100%    Neurologically intact Incision: dressing C/D/I  LABS Recent Labs    04/07/22 1434 04/08/22 0440  HGB 15.8 16.0  HCT 48.2 46.8  WBC 12.8* 10.0  PLT 149* 154    Recent Labs    04/06/22 0736 04/07/22 1434 04/08/22 0440  NA 140  --  136  K 4.2  --  3.7  BUN 38*  --  17  CREATININE 1.12 0.81 0.81  GLUCOSE 115*  --  145*    No results for input(s): "LABPT", "INR" in the last 72 hours.   Assessment/Plan: 1 Day Post-Op Procedure(s) (LRB): ARTHROPLASTY BIPOLAR HIP (HEMIARTHROPLASTY) (Right)   Advance diet Up with therapy Discharge to SNF or psych ward when medically stable  Partial weightbearing right leg with walker  Coated aspirin 81 mg twice daily when he is discharged  Follow-up in my office 2 weeks for exam and x-ray

## 2022-04-08 NOTE — Progress Notes (Signed)
Nutrition Brief Note  RD consulted for assessment of nutritional requirements/ status.   Wt Readings from Last 15 Encounters:  04/02/22 100 kg  02/07/22 100 kg  01/31/22 99.6 kg  01/24/22 98.9 kg  01/02/22 102.1 kg  12/26/21 102.1 kg  12/21/21 101.9 kg  12/03/21 (P) 102 kg  11/09/21 108 kg  10/02/21 106.6 kg  09/06/21 106.6 kg  06/05/21 107.5 kg  02/23/21 107.8 kg  11/15/20 108.5 kg  02/24/20 109.7 kg   Pt with medical history significant for paroxysmal A-fib, depression, diabetes mellitus, history of subdural hematoma, hypertension who was admitted to the [facility] psych unit for management of depression for which he has been getting ECT.  Pt admitted with rt hip fracture.   6/11- s/p right   hip hemiarthroplasty with [employer] Accolade prosthesis  Reviewed I/O's: -5.3 ml x 24 hours and -830 ml since admission  UOP: 2.1 L x 24 hours  Spoke with pt and sitter at bedside. Pt reports feeling well. He just finished lunch and consumed 100% of his meal. Observed him eating mac and cheese without difficulty.   Per pt, he ate "regularly" at home. Pt shares he usually consumes 3 meals per day. He denies any weight loss.   Discussed importance of good meal intake to promote healing.   Reviewed wt hx.   Nutrition-Focused physical exam completed. Findings are no fat depletion, no muscle depletion, and no edema.    Current diet order is regular, patient is consuming approximately 100% of meals at this time. Labs and medications reviewed.   No nutrition interventions warranted at this time. If nutrition issues arise, please consult RD.   Loistine Chance, RD, LDN, [place] Registered Dietitian II Certified Diabetes Care and Education Specialist Please refer to [facility] for RD and/or RD on-call/weekend/after hours pager

## 2022-04-08 NOTE — Progress Notes (Signed)
Physical Therapy Evaluation Patient Details Name: John [name] MRN: [id number] DOB: July 08, 1941 Today's Date: 04/08/2022  History of Present Illness  Pt is a 81 yo male with PMH of depression, anxiety, HTN, DM, OSA, prostate cancer, Horner syndrome, dissection of carotid artery, subdural hematoma, epidural hematoma. Previously hospitalized from 6/5-6/8 for severe depression and found to have abnormal EKG with A-fib. Pt receiving ECT for management of depression. Pt admitted 6/10 due to R femoral neck fx due to a fall in the  bathroom on the evening of 6/9. Pt underwent R hip hemiarthroplasty on 6/11.   Clinical Impression  Pt is a 81 year old male who was admitted for R hip hemiarthroplasty on 6/11 due to R femoral neck fx after a fall on 6/9. Pt precaution education provided prior to mobility with additional review with pt seated EOB. Pt performs bed mobility with min A for R LE management and step pivot transfer from bed to chair with min Ax2 for safety. Pt required max verbal cues for maintaining R posterior hip precautions during mobility, especially with sit-to-stand and stand-to-sit transfers. Pt demonstrates deficits with R LE strength with inability to complete SLR in supine, balance, endurance and activity tolerance, and decreased safety awareness with hip precautions. Would benefit from skilled PT to address above deficits and promote optimal return to PLOF. Currently recommend SNF at discharge due to current assistance needed with mobility, decreased activity tolerance, and safety with difficulty following precautions.      Recommendations for follow up therapy are one component of a multi-disciplinary discharge planning process, led by the attending physician.  Recommendations may be updated based on patient status, additional functional criteria and insurance authorization.  Follow Up Recommendations Skilled nursing-short term rehab (<3 hours/day)    Assistance Recommended at Discharge  Frequent or constant Supervision/Assistance  Patient can return home with the following  A lot of help with walking and/or transfers;Assistance with cooking/housework;Assist for transportation;Help with stairs or ramp for entrance;A lot of help with bathing/dressing/bathroom    Equipment Recommendations Rolling walker (2 wheels);BSC/3in1  Recommendations for Other Services  OT consult    Functional Status Assessment Patient has had a recent decline in their functional status and demonstrates the ability to make significant improvements in function in a reasonable and predictable amount of time.     Precautions / Restrictions Precautions Precautions: Posterior Hip;Fall Precaution Booklet Issued: No Required Braces or Orthoses: Other Brace (Hip ABD pillow) Restrictions Weight Bearing Restrictions: Yes RLE Weight Bearing: Partial weight bearing RLE Partial Weight Bearing Percentage or Pounds: 25%      Mobility  Bed Mobility Overal bed mobility: Needs Assistance Bed Mobility: Supine to Sit     Supine to sit: Min assist     General bed mobility comments: Min A for R LE management when transferring from supine to sit EOB    Transfers Overall transfer level: Needs assistance Equipment used: Rolling walker (2 wheels) Transfers: Sit to/from Stand, Bed to chair/wheelchair/BSC Sit to Stand: Min assist, +2 physical assistance, +2 safety/equipment   Step pivot transfers: Min assist       General transfer comment: Pt required heavy verbal cues for UE/LE placement for standing/sitting and maintaining precautions including PWB R LE    Ambulation/Gait Ambulation/Gait assistance: Min assist, +2 physical assistance Gait Distance (Feet): 2 Feet Assistive device: Rolling walker (2 wheels)   Gait velocity: decreased.     General Gait Details: Only step pivot transfer from bed to chair completed today due to patient fatigue and safety.  Stairs            Wheelchair  Mobility    Modified Rankin (Stroke Patients Only)       Balance Overall balance assessment: Needs assistance, History of Falls Sitting-balance support: Feet supported, No upper extremity supported Sitting balance-Leahy Scale: Fair Sitting balance - Comments: Able to reach for RW with bilat UE without LOB in sitting EOB   Standing balance support: Bilateral upper extremity supported, Reliant on assistive device for balance Standing balance-Leahy Scale: Fair Standing balance comment: Pt required RW with bilat UE support to maintain balance in standing due to [facility] precautions of R LE                             Pertinent Vitals/Pain Pain Assessment Pain Assessment: 0-10 Pain Score: 5  Pain Location: R hip Pain Descriptors / Indicators: Operative site guarding, Discomfort Pain Intervention(s): Limited activity within patient's tolerance, Monitored during session, Repositioned    Home Living Family/patient expects to be discharged to:: Private residence Living Arrangements: Spouse/significant other Available Help at Discharge: Family Type of Home: House Home Access: Stairs to enter Entrance Stairs-Rails: Can reach both Entrance Stairs-Number of Steps: 5   Home Layout: One level Home Equipment: None      Prior Function Prior Level of Function : Independent/Modified Independent             Mobility Comments: Pt previously ambulated without AD. ADLs Comments: Independent with bathing, dressing, feeding, driving     Hand Dominance   Dominant Hand: Right    Extremity/Trunk Assessment   Upper Extremity Assessment Upper Extremity Assessment: Overall WFL for tasks assessed    Lower Extremity Assessment Lower Extremity Assessment: RLE deficits/detail RLE Deficits / Details: Pt unable to complete R SLR in supine. Pt required assist for R LE management from supine to sitting EOB.       Communication   Communication: No difficulties  Cognition  Arousal/Alertness: Awake/alert Behavior During Therapy: Flat affect Overall Cognitive Status: Within Functional Limits for tasks assessed                                 General Comments: Pt A&Ox3 with pt providing year of 1923.        General Comments      Exercises Other Exercises Other Exercises: Supine ther-ex of R LE included AROM AP and SAQs x10 reps ea. Attempted R LE SLR with pt unable to complete. (Pt very lethargic, falling asleep during supine exercises) Other Exercises: Seated ther-ex R LE included LAQs x10 reps, AAROM   Assessment/Plan    PT Assessment Patient needs continued PT services  PT Problem List Decreased strength;Decreased range of motion;Decreased activity tolerance;Decreased balance;Decreased safety awareness;Decreased knowledge of use of DME;Decreased knowledge of precautions;Pain;Decreased mobility       PT Treatment Interventions Gait training;Therapeutic activities;Therapeutic exercise;DME instruction;Stair training;Patient/family education    PT Goals (Current goals can be found in the Care Plan section)  Acute Rehab PT Goals Patient Stated Goal: to go home PT Goal Formulation: With patient Time For Goal Achievement: 04/22/22 Potential to Achieve Goals: Good Additional Goals Additional Goal #1: Pt will complete bed mobility and transfers (sit-to-stand/to BSC/to chair) with SBA to demonstrate independence with functional tasks.    Frequency BID     Co-evaluation               AM-PAC PT "  6 Clicks" Mobility  Outcome Measure Help needed turning from your back to your side while in a flat bed without using bedrails?: A Little Help needed moving from lying on your back to sitting on the side of a flat bed without using bedrails?: A Little Help needed moving to and from a bed to a chair (including a wheelchair)?: A Lot Help needed standing up from a chair using your arms (e.g., wheelchair or bedside chair)?: A Lot Help needed  to walk in hospital room?: A Lot Help needed climbing 3-5 steps with a railing? : A Lot 6 Click Score: 14    End of Session Equipment Utilized During Treatment: Gait belt Activity Tolerance: Patient limited by lethargy (Pt falling asleep during supine ther-ex during today's session) Patient left: in chair;with call bell/phone within reach;with chair alarm set;with nursing/sitter in room;with SCD's reapplied;Other (comment) (with bilat pillows under knees/between LEs for maintaining posterior hip add/abd precautions) Nurse Communication: Mobility status PT Visit Diagnosis: Muscle weakness (generalized) (M62.81);History of falling (Z91.81);Pain Pain - Right/Left: Right Pain - part of body: Hip    Time: 7902-4097 PT Time Calculation (min) (ACUTE ONLY): 34 min   Charges:   PT Evaluation $PT Eval Moderate Complexity: 1 Mod PT Treatments $Therapeutic Exercise: 8-22 mins        Keenya Matera, SPT   Shakiya Mcneary 04/08/2022, 1:15 PM

## 2022-04-08 NOTE — Progress Notes (Signed)
PROGRESS NOTE    John [name]  IRW:[id number] DOB: 03/18/1941 DOA: 04/06/2022 PCP: Juluis Pitch, MD    Brief Narrative:   John [name] is a 81 y.o. male with medical history significant for paroxysmal A-fib, depression, diabetes mellitus, history of subdural hematoma, hypertension who was admitted to the [facility] psych unit for management of depression for which she has been getting ECT. Patient states that he got up to use the bathroom last night and fell.  He denies feeling dizzy or lightheaded and states that he thinks he tripped and fell.  He got back in bed and this fall was unwitnessed.  This morning he started complaining of pain in the right hip and groin area which he rated a 7 x 10 in intensity at its worst.  He has been unable to bear weight on his right leg and was moved from his room in a recliner. X-ray showed displaced fracture in the neck of right femur. He denies having any chest pain, no shortness of breath, no nausea, no vomiting, no headache, no dizziness, no lightheadedness, no abdominal pain, no changes in his bowel habits, no fever, no urinary symptoms. He is unable to tell me if he hit his head when he fell.  6/12 status post right hip arthroplasty by Dr. Sabra Heck on 6/11    Consultants:  orthopedics  Procedures:   Antimicrobials:      Subjective: Complaining of some hip pain of right no other complaints  Objective: Vitals:   04/07/22 1549 04/07/22 2003 04/08/22 0426 04/08/22 0734  BP: 109/76 (!) 146/83 125/86 118/74  Pulse: 64 79 78 80  Resp: '16 20 20 18  '$ Temp: 97.7 F (36.5 C) 97.7 F (36.5 C) 97.6 F (36.4 C) 98.5 F (36.9 C)  TempSrc:      SpO2: 99% 99% 96% 100%    Intake/Output Summary (Last 24 hours) at 04/08/2022 0846 Last data filed at 04/08/2022 0839 Gross per 24 hour  Intake 2049.69 ml  Output 2135 ml  Net -85.31 ml   There were no vitals filed for this visit.  Examination: Calm, NAD Cta no w/r Reg s1/s2 no gallop Soft benign  +bs No edema Aaoxox3  Mood and affect appropriate in current setting     Data Reviewed: I have personally reviewed following labs and imaging studies  CBC: Recent Labs  Lab 04/01/22 1539 04/04/22 0525 04/05/22 0724 04/07/22 1434 04/08/22 0440  WBC 5.8 7.2 7.7 12.8* 10.0  HGB 18.1* 19.2* 19.6* 15.8 16.0  HCT 52.7* 54.8* 58.8* 48.2 46.8  MCV 90.2 90.1 95.0 95.8 92.1  PLT 178 200 247 149* 761   Basic Metabolic Panel: Recent Labs  Lab 04/01/22 1539 04/02/22 1323 04/03/22 0313 04/04/22 0525 04/05/22 0724 04/06/22 0736 04/07/22 1434 04/08/22 0440  NA 139   < > 141 139 140 140  --  136  K 3.5   < > 4.3 3.5 3.4* 4.2  --  3.7  CL 107   < > 108 113* 108 112*  --  110  CO2 21*   < > 28 17* 19* 25  --  20*  GLUCOSE 129*   < > 103* 115* 162* 115*  --  145*  BUN 22   < > 21 20 26* 38*  --  17  CREATININE 0.95   < > 0.97 0.71 1.43* 1.12 0.81 0.81  CALCIUM 9.4   < > 9.1 9.1 9.1 9.2  --  8.4*  MG 2.1  --  2.5*  --   --   --   --   --    < > =  values in this interval not displayed.   GFR: Estimated Creatinine Clearance: 86 mL/min (by C-G formula based on SCr of 0.81 mg/dL). Liver Function Tests: No results for input(s): "AST", "ALT", "ALKPHOS", "BILITOT", "PROT", "ALBUMIN" in the last 168 hours. No results for input(s): "LIPASE", "AMYLASE" in the last 168 hours. No results for input(s): "AMMONIA" in the last 168 hours. Coagulation Profile: No results for input(s): "INR", "PROTIME" in the last 168 hours. Cardiac Enzymes: Recent Labs  Lab 04/02/22 1323  CKTOTAL 134   BNP (last 3 results) No results for input(s): "PROBNP" in the last 8760 hours. HbA1C: No results for input(s): "HGBA1C" in the last 72 hours. CBG: Recent Labs  Lab 04/04/22 2115 04/07/22 1106 04/07/22 1144 04/07/22 1551 04/08/22 0757  GLUCAP 144* 107* 131* 182* 134*   Lipid Profile: No results for input(s): "CHOL", "HDL", "LDLCALC", "TRIG", "CHOLHDL", "LDLDIRECT" in the last 72 hours. Thyroid  Function Tests: No results for input(s): "TSH", "T4TOTAL", "FREET4", "T3FREE", "THYROIDAB" in the last 72 hours. Anemia Panel: No results for input(s): "VITAMINB12", "FOLATE", "FERRITIN", "TIBC", "IRON", "RETICCTPCT" in the last 72 hours. Sepsis Labs: No results for input(s): "PROCALCITON", "LATICACIDVEN" in the last 168 hours.  Recent Results (from the past 240 hour(s))  SARS Coronavirus 2 by RT PCR (hospital order, performed in [facility] hospital lab) *cepheid single result test* Anterior Nasal Swab     Status: None   Collection Time: 04/04/22  3:08 PM   Specimen: Anterior Nasal Swab  Result Value Ref Range Status   SARS Coronavirus 2 by RT PCR NEGATIVE NEGATIVE Final    Comment: (NOTE) SARS-CoV-2 target nucleic acids are NOT DETECTED.  The SARS-CoV-2 RNA is generally detectable in upper and lower respiratory specimens during the acute phase of infection. The lowest concentration of SARS-CoV-2 viral copies this assay can detect is 250 copies / mL. A negative result does not preclude SARS-CoV-2 infection and should not be used as the sole basis for treatment or other patient management decisions.  A negative result may occur with improper specimen collection / handling, submission of specimen other than nasopharyngeal swab, presence of viral mutation(s) within the areas targeted by this assay, and inadequate number of viral copies (<250 copies / mL). A negative result must be combined with clinical observations, patient history, and epidemiological information.  Fact Sheet for Patients:   https://www.patel.info/  Fact Sheet for Healthcare Providers: https://hall.com/  This test is not yet approved or  cleared by the [place] FDA and has been authorized for detection and/or diagnosis of SARS-CoV-2 by FDA under an Emergency Use Authorization (EUA).  This EUA will remain in effect (meaning this test can be used) for the duration of  the COVID-19 declaration under Section 564(b)(1) of the Act, 21 U.S.C. section 360bbb-3(b)(1), unless the authorization is terminated or revoked sooner.  Performed at [facility], [address] [place], [place] [address]          Radiology Studies: DG Hip Port Unilat With Pelvis 1V Right  Result Date: 04/07/2022 CLINICAL DATA:  Postop right hemiarthroplasty. EXAM: DG HIP (WITH OR WITHOUT PELVIS) 1V PORT RIGHT COMPARISON:  04/06/2022. FINDINGS: Right hip hemiarthroplasty appears well seated and well-positioned. No acute fracture or evidence of an operative complication. IMPRESSION: Well-positioned right hip hemiarthroplasty. Electronically Signed   By: Lajean Manes M.D.   On: 04/07/2022 11:13   CT HEAD WO CONTRAST (5MM)  Result Date: 04/06/2022 CLINICAL DATA:  Status post trauma, altered mental status. EXAM: CT HEAD WITHOUT CONTRAST TECHNIQUE: Contiguous  axial images were obtained from the base of the skull through the vertex without intravenous contrast. RADIATION DOSE REDUCTION: This exam was performed according to the departmental dose-optimization program which includes automated exposure control, adjustment of the mA and/or kV according to patient size and/or use of iterative reconstruction technique. COMPARISON:  Mar 25, 2022 FINDINGS: Brain: There is mild cerebral atrophy with widening of the extra-axial spaces and ventricular dilatation. There are areas of decreased attenuation within the white matter tracts of the supratentorial brain, consistent with microvascular disease changes. Vascular: No hyperdense vessel or unexpected calcification. Skull: Normal. Negative for fracture or focal lesion. Sinuses/Orbits: A left frontal burr hole and partial craniotomy defect is seen. Other: None. IMPRESSION: 1. No acute intracranial abnormality. 2. Generalized cerebral atrophy with chronic white matter small vessel ischemic changes. Electronically Signed   By: Virgina Norfolk M.D.    On: 04/06/2022 18:25   Chest Portable 1 View  Result Date: 04/06/2022 CLINICAL DATA:  Short of breath. EXAM: PORTABLE CHEST 1 VIEW COMPARISON:  11/09/2021. FINDINGS: Cardiac silhouette normal in size.  No mediastinal or hilar masses. Clear lungs.  No pleural effusion or pneumothorax. Skeletal structures are grossly intact. IMPRESSION: No active disease. Electronically Signed   By: Lajean Manes M.D.   On: 04/06/2022 18:05   DG HIP UNILAT WITH PELVIS 2-3 VIEWS RIGHT  Result Date: 04/06/2022 CLINICAL DATA:  Trauma, fall EXAM: DG HIP (WITH OR WITHOUT PELVIS) 2-3V RIGHT COMPARISON:  None Available. FINDINGS: Acute fracture is seen in the neck of right femur. There is 9 mm offset in the alignment of medial cortical margin of neck. There is no dislocation. Degenerative changes are noted in the visualized lower lumbar spine. Surgical clips are seen in the region of prostate. IMPRESSION: There is displaced fracture in the neck of right femur. Electronically Signed   By: Elmer Picker M.D.   On: 04/06/2022 13:50        Scheduled Meds:  buPROPion  150 mg Oral Daily   docusate sodium  100 mg Oral BID   enoxaparin (LOVENOX) injection  30 mg Subcutaneous Q24H   ferrous sulfate  325 mg Oral Q breakfast   metoprolol tartrate  25 mg Oral BID   senna  1 tablet Oral BID   Continuous Infusions:  sodium chloride 75 mL/hr at 04/08/22 0002   sodium chloride 75 mL/hr at 04/07/22 0819    ceFAZolin (ANCEF) IV 2 g (04/08/22 0002)   methocarbamol (ROBAXIN) IV      Assessment & Plan:   Principal Problem:   Fracture of femoral neck (HCC) Active Problems:   Essential hypertension   Paroxysmal atrial fibrillation (HCC)   Severe recurrent major depression without psychotic features ([place])   Type 2 diabetes mellitus with hyperlipidemia (HCC)   Dehydration  Fracture of femoral neck (HCC) Status post mechanical fall with displaced fracture of right femoral neck. Rule out pathologic fracture since patient  has a history of prostate cancer 6/11Status post right hip arthroplasty  6/12 PT Bowel regimen Pain control    Essential hypertension Stable Continue beta-blockers   Paroxysmal atrial fibrillation (HCC) Currently in sinus rhythm Not a candidate for anticoagulation due to history of spontaneous subdural hematoma Hold aspirin for planned procedure 6/12 continue beta-blockers      Severe recurrent major depression without psychotic features [facility]) Patient admitted to the [facility] psych unit for management of his depression and was receiving ECT therapy. Continue bupropion 6/12 psychiatry consulted input pending    Dehydration Continue ivf for  now.    Type 2 diabetes mellitus with hyperlipidemia ([place]) Continue consistent carbohydrate diet and check blood sugars with meals. Patient's last hemoglobin A1c was less than 6 6/12 bg stable. Continue on riss           DVT prophylaxis: Lovenox Code Status: Full Family Communication: None at bedside Disposition Plan:  Status is: Inpatient Remains inpatient appropriate because: Status post surgery needs PT evaluation. IV treatment.likely will need snf       LOS: 2 days   Time spent: 35 minutes    Nolberto Hanlon, MD [facility] Pager [phone]  If 7PM-7AM, please contact night-coverage 04/08/2022, 8:46 AM

## 2022-04-08 NOTE — TOC Initial Note (Signed)
Transition of Care [facility]) - Initial/Assessment Note    Patient Details  Name: John [name] MRN: [id number] Date of Birth: Jun 17, 1941  Transition of Care [facility]) CM/SW Contact:    Eileen Stanford, LCSW Phone Number: 04/08/2022, 3:02 PM  Clinical Narrative:   CSW spoke with pt at bedside to discuss PT rec for SNF. Pt is unsure if he wants to go to SNF or dc home with spouse with HH. Both options were presented to pt. Pt is going to take tonight to think about it and CSW to follow up tomorrow.                Expected Discharge Plan: Skilled Nursing Facility Barriers to Discharge: Continued Medical Work up   Patient Goals and CMS Choice Patient states their goals for this hospitalization and ongoing recovery are:: to get better   Choice offered to / list presented to : Patient  Expected Discharge Plan and Services Expected Discharge Plan: [place] In-house Referral: Clinical Social Work   Post Acute Care Choice:  (TBD) Living arrangements for the past 2 months: Single Family Home                                      Prior Living Arrangements/Services Living arrangements for the past 2 months: Single Family Home Lives with:: Spouse Patient language and need for interpreter reviewed:: Yes Do you feel safe going back to the place where you live?: Yes      Need for Family Participation in Patient Care: Yes (Comment) Care giver support system in place?: Yes (comment)   Criminal Activity/Legal Involvement Pertinent to Current Situation/Hospitalization: No - Comment as needed  Activities of Daily Living Home Assistive Devices/Equipment: None ADL Screening (condition at time of admission) Patient's cognitive ability adequate to safely complete daily activities?: No Is the patient deaf or have difficulty hearing?: Yes Does the patient have difficulty seeing, even when wearing glasses/contacts?: No Does the patient have difficulty concentrating, remembering, or  making decisions?: Yes Patient able to express need for assistance with ADLs?: Yes Does the patient have difficulty dressing or bathing?: Yes Independently performs ADLs?: No Communication: Independent Dressing (OT): Needs assistance Is this a change from baseline?: Change from baseline, expected to last <3days Grooming: Independent Feeding: Independent Bathing: Needs assistance Is this a change from baseline?: Change from baseline, expected to last <3 days Toileting: Needs assistance Is this a change from baseline?: Change from baseline, expected to last <3 days In/Out Bed: Needs assistance Is this a change from baseline?: Change from baseline, expected to last <3 days Walks in Home: Needs assistance Is this a change from baseline?: Change from baseline, expected to last <3 days Does the patient have difficulty walking or climbing stairs?: Yes Weakness of Legs: Right Weakness of Arms/Hands: None  Permission Sought/Granted Permission sought to share information with : Family Supports Permission granted to share information with : Yes, Release of Information Signed  Share Information with NAME: carol     Permission granted to share info w Relationship: spouse     Emotional Assessment Appearance:: Appears stated age Attitude/Demeanor/Rapport: Avoidant Affect (typically observed): Accepting Orientation: : Oriented to Self, Oriented to Place, Oriented to  Time, Oriented to Situation Alcohol / Substance Use: Not Applicable Psych Involvement: No (comment)  Admission diagnosis:  Femoral neck fracture ([place]) [S72.009A] Fracture of femoral neck ([place]) [S72.009A] Patient Active Problem List   Diagnosis  Date Noted   Fracture of femoral neck ([place]) 04/06/2022   Dehydration 04/05/2022   Erythrocytosis 04/04/2022   Severe recurrent major depression without psychotic features ([place]) 04/04/2022   Type 2 diabetes mellitus with hyperlipidemia ([facility])    Paroxysmal atrial fibrillation ([place])  04/02/2022   Depression with anxiety 04/01/2022   Essential hypertension    MDD (major depressive disorder) 03/13/2022   History of seizure 06/21/2016   Headache, migraine 12/22/2015   Malignant neoplasm of prostate ([place]) 06/19/2015   Prostate cancer ([employer]) 06/04/2015   Clinical depression 06/13/2014   BP (high blood pressure) 06/13/2014   Apnea, sleep 06/13/2014   Recurrent major depressive disorder, in partial remission ([place]) 06/13/2014   PCP:  Juluis Pitch, MD Pharmacy:   [id number] [place], [place] [id number]: [phone]: [phone]    Social Determinants of Health (SDOH) Interventions    Readmission Risk Interventions     No data to display

## 2022-04-08 NOTE — Progress Notes (Signed)
Physical Therapy Treatment Patient Details Name: John [name] MRN: [id number] DOB: Mar 14, 1941 Today's Date: 04/08/2022   History of Present Illness Pt is a 81 yo male with PMH of depression, anxiety, HTN, DM, OSA, prostate cancer, Horner syndrome, dissection of carotid artery, subdural hematoma, epidural hematoma. Previously hospitalized from 6/5-6/8 for severe depression and found to have abnormal EKG with A-fib. Pt receiving ECT for management of depression. Pt admitted 6/10 due to R femoral neck fx due to a fall in the  bathroom on the evening of 6/9. Pt underwent R hip hemiarthroplasty on 6/11.    PT Comments    Pt sitting in chair eating lunch at start of PM tx session. Pt reported 10/10 pain in R hip while sitting in recliner, and requested to get back into bed. Pt required mod Ax1 for sit-to-stand out of chair and min A for ambulating 8 feet prior to lying back down in bed. Max verbal cues required for UE/LE placement to maintain precautions during transfers and ambulation. Pt educated at start and end of session on posterior hip precautions with verbal instruction and demonstration. Pt would benefit from continued education on precautions due to assist required for pt to verbalize precautions at end of session. Continue to recommend SNF at discharge.   Recommendations for follow up therapy are one component of a multi-disciplinary discharge planning process, led by the attending physician.  Recommendations may be updated based on patient status, additional functional criteria and insurance authorization.  Follow Up Recommendations  Skilled nursing-short term rehab (<3 hours/day)     Assistance Recommended at Discharge Frequent or constant Supervision/Assistance  Patient can return home with the following A lot of help with walking and/or transfers;Assistance with cooking/housework;Assist for transportation;Help with stairs or ramp for entrance;A lot of help with bathing/dressing/bathroom    Equipment Recommendations  Rolling walker (2 wheels);BSC/3in1    Recommendations for Other Services OT consult     Precautions / Restrictions Precautions Precautions: Posterior Hip;Fall Precaution Booklet Issued: Yes (comment) Required Braces or Orthoses: Other Brace Other Brace: Hip ABDuction pillow in supine Restrictions Weight Bearing Restrictions: Yes RLE Weight Bearing: Partial weight bearing RLE Partial Weight Bearing Percentage or Pounds: 25%     Mobility  Bed Mobility Overal bed mobility: Needs Assistance Bed Mobility: Sit to Supine     Sit to supine: Min assist   General bed mobility comments: Min A for R LE management when transferring from sitting EOB to supine. Verbal cues for scooting R hip back while lying down on L side. Pt Min A with verbal cues for using bilat UEs on bed rail to assist in scooting toward HOB in supine    Transfers Overall transfer level: Needs assistance Equipment used: Rolling walker (2 wheels) Transfers: Sit to/from Stand Sit to Stand: Mod assist         General transfer comment: Pt required heavy verbal cues for UE/LE placement for standing/sitting and maintaining precautions including PWB R LE. PT demonstrated proper precautions with how to complete sit-to-stand transfer and ambulation with RW prior to mobility this session.    Ambulation/Gait Ambulation/Gait assistance: Min assist Gait Distance (Feet): 8 Feet Assistive device: Rolling walker (2 wheels) Gait Pattern/deviations: Antalgic, Decreased step length - right, Decreased step length - left, Decreased stride length, Shuffle Gait velocity: decreased.   Pre-gait activities: Attempted to step in place prior to ambulating forward. Pt took 1 step in place and utilized UE support on RW appropriately prior to ambulation forward. General Gait Details: Ambulated from recliner  to end of bed before turning around to ambulate toward the bed. Pt takes small, cautious steps. Utilized  more UE support on RW this session to offload R LE, but continues to be inconsistent without max verbal cues for precautions.   Stairs             Wheelchair Mobility    Modified Rankin (Stroke Patients Only)       Balance Overall balance assessment: Needs assistance, History of Falls Sitting-balance support: Feet supported, No upper extremity supported Sitting balance-Leahy Scale: Fair Sitting balance - Comments: Able to reach for RW with bilat UE without LOB in sitting in recliner with back unsupported. Pt able to complete LAQs while seated at edge of recliner.   Standing balance support: Bilateral upper extremity supported, Reliant on assistive device for balance Standing balance-Leahy Scale: Fair Standing balance comment: Pt required RW with bilat UE support to maintain balance in standing due to PWB precautions of R LE                            Cognition Arousal/Alertness: Awake/alert Behavior During Therapy: Flat affect Overall Cognitive Status: Within Functional Limits for tasks assessed                                 General Comments: Pt oriented to name and date of birth        Exercises Other Exercises Other Exercises: Seated ther-ex of R LE included LAQs, AAROM, x10 reps (Pt stated "it just doesn't want to go" and also reported pain with LAQs.)    General Comments        Pertinent Vitals/Pain Pain Assessment Pain Assessment: 0-10 Pain Score: 10-Worst pain ever Pain Location: R hip Pain Descriptors / Indicators: Operative site guarding, Other (Comment) (excruciating) Pain Intervention(s): Limited activity within patient's tolerance, Monitored during session, Repositioned, Premedicated before session    Home Living Family/patient expects to be discharged to:: Private residence Living Arrangements: Spouse/significant other Available Help at Discharge: Family Type of Home: House Home Access: Stairs to enter Entrance  Stairs-Rails: Can reach both Entrance Stairs-Number of Steps: 5   Home Layout: One level Home Equipment: None      Prior Function            PT Goals (current goals can now be found in the care plan section) Acute Rehab PT Goals Patient Stated Goal: to go home PT Goal Formulation: With patient Time For Goal Achievement: 04/22/22 Potential to Achieve Goals: Good Additional Goals Additional Goal #1: Pt will complete bed mobility and transfers (sit-to-stand/to BSC/to chair) with SBA to demonstrate independence with functional tasks. Progress towards PT goals: Progressing toward goals    Frequency    BID      PT Plan Current plan remains appropriate    Co-evaluation              AM-PAC PT "6 Clicks" Mobility   Outcome Measure  Help needed turning from your back to your side while in a flat bed without using bedrails?: A Little Help needed moving from lying on your back to sitting on the side of a flat bed without using bedrails?: A Little Help needed moving to and from a bed to a chair (including a wheelchair)?: A Lot Help needed standing up from a chair using your arms (e.g., wheelchair or bedside chair)?: A Lot Help needed to  walk in hospital room?: A Lot Help needed climbing 3-5 steps with a railing? : A Lot 6 Click Score: 14    End of Session Equipment Utilized During Treatment: Gait belt Activity Tolerance: Patient limited by pain Patient left: in bed;with call bell/phone within reach;with bed alarm set;with nursing/sitter in room (with hip ABDuction pillow placed) Nurse Communication: Mobility status PT Visit Diagnosis: Muscle weakness (generalized) (M62.81);History of falling (Z91.81);Pain Pain - Right/Left: Right Pain - part of body: Hip     Time: 2671-2458 PT Time Calculation (min) (ACUTE ONLY): 32 min  Charges:                      John [name], SPT   John [name] 04/08/2022, 3:02 PM

## 2022-04-08 NOTE — Consult Note (Signed)
[facility] Consult   Reason for Consult: Suicide precautions Referring Physician: Amery Patient Identification: John [name] MRN:  [id number] Principal Diagnosis: Fracture of femoral neck ([place]) Diagnosis:  Principal Problem:   Fracture of femoral neck ([place]) Active Problems:   Essential hypertension   Paroxysmal atrial fibrillation (HCC)   Type 2 diabetes mellitus with hyperlipidemia (HCC)   Severe recurrent major depression without psychotic features ([place])   Dehydration   Total Time spent with patient: 20 minutes  [name] is a 81 y.o. male patient admitted with patient was originally on the geriatric psychiatry inpatient unit, when he fell and broke his hip on 04/06/2021.  He is currently on the medical unit after hemiarthroplasty.  HPI: Patient was seen in consult after he fell on the geriatric inpatient psychiatry unit and was referred to ortho on 04/06/2022.patient had hemiarthroplasty of the left hip, on 04/07/2022. On evaluation, patient was sleepy, on IV pain medication.  He is alert enough to answer brief questions and is oriented x4.  He denies suicidal ideation.  States that he is feeling "about the same" in his mood.  No aggressive or unsafe behaviors noted.  Patient has a sitter at bedside who states that he has been "in and out" of sleep, but has not had any expression of suicidal thoughts or unsafe behavior. Patient's estimated date of discharge from the medical unit is admitted as 04/10/2021.  He may not be a candidate to return to the geriatric inpatient psychiatry floor due to his medical needs and rehabilitation.  He had been getting ECT prior to fall. We will follow up with patient tomorrow.  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 06/2015 CT Head: abnl thickening of mid-dist R cervical ICA w/o narrowing - ? vasculitis and thrombosed/healing dissection; b. 01/2021 Carotid U/S: 1-39% bilat ICA stenoses.   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 Age 20 - struck in head w/ baseball --> s/p craniotomy.   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 Approx 2015 - eval in [place] - conservatively managed.    Past Surgical History:  Procedure Laterality Date   ANKLE ARTHROSCOPY Left    fracture   CHOLECYSTECTOMY  2007   [place]   left frontal craniotomy for epidural hematoma   HIP ARTHROPLASTY Right 04/07/2022   Procedure: ARTHROPLASTY BIPOLAR HIP (HEMIARTHROPLASTY);  Surgeon: Earnestine Leys, MD;  Location: [facility] ORS;  Service: Orthopedics;  Laterality: Right;   INSERTION OF MESH  01/02/2022   Procedure: INSERTION OF MESH;  Surgeon: Herbert Pun, MD;  Location: [facility] ORS;  Service: General;;   LIGAMENT REPAIR Right  thumb   RETINAL DETACHMENT SURGERY     TREATMENT FISTULA ANAL  2006   Family History:  Family History  Problem Relation Age of Onset   Osteoporosis Mother    Depression Mother    Prostate cancer Father    Hypertension Father    Diabetes Brother    Kidney disease Neg Hx    Family Psychiatric  History: See previous Social History:  Social History   Substance and Sexual Activity  Alcohol Use Not Currently     Social History   Substance and Sexual Activity  Drug Use No    Social History   Socioeconomic History   Marital status: Married    Spouse  name: [name]   Number of children: 2   Years of education: Not on file   Highest education level: Not on file  Occupational History   Not on file  Tobacco Use   Smoking status: Never   Smokeless tobacco: Never  Vaping Use   Vaping Use: Never used  Substance and Sexual Activity   Alcohol use: Not Currently   Drug use: No   Sexual activity: Yes    Birth control/protection: None  Other Topics Concern   Not on file  Social History Narrative   Live at home with wife.    Social Determinants of Health   Financial Resource Strain: Not on file  Food Insecurity: Not on file  Transportation Needs: Not on file  Physical Activity: Not on file  Stress: Not on file  Social Connections: Not on file   Additional Social History:    Allergies:  No Known Allergies  Labs:  Results for orders placed or performed during the hospital encounter of 04/06/22 (from the past 48 hour(s))  Type and screen [place]     Status: None   Collection Time: 04/07/22  8:05 AM  Result Value Ref Range   ABO/RH(D) O NEG    Antibody Screen NEG    Sample Expiration      04/10/2022,2359 Performed at [facility], [address] [place], [place] [address]   ABO/Rh     Status: None (Preliminary result)   Collection Time: 04/07/22  8:06 AM  Result Value Ref Range   ABO/RH(D) PENDING   Glucose, capillary     Status: Abnormal   Collection Time: 04/07/22 11:06 AM  Result Value Ref Range   Glucose-Capillary 107 (H) 70 - 99 mg/dL    Comment: Glucose reference range applies only to samples taken after fasting for at least 8 hours.  Glucose, capillary     Status: Abnormal   Collection Time: 04/07/22 11:44 AM  Result Value Ref Range   Glucose-Capillary 131 (H) 70 - 99 mg/dL    Comment: Glucose reference range applies only to samples taken after fasting for at least 8 hours.   Comment 1 Notify RN    Comment 2 Document in Chart   CBC     Status: Abnormal   Collection Time:  04/07/22  2:34 PM  Result Value Ref Range   WBC 12.8 (H) 4.0 - 10.5 K/uL   RBC 5.03 4.22 - 5.81 MIL/uL   Hemoglobin 15.8 13.0 - 17.0 g/dL   HCT 48.2 39.0 - 52.0 %   MCV 95.8 80.0 - 100.0 fL   MCH 31.4 26.0 - 34.0 pg   MCHC 32.8 30.0 - 36.0 g/dL   RDW 13.1 11.5 - 15.5 %   Platelets 149 (L) 150 - 400 K/uL   nRBC  0.0 0.0 - 0.2 %    Comment: Performed at [facility], [address] [place], [place] [address]  Creatinine, serum     Status: None   Collection Time: 04/07/22  2:34 PM  Result Value Ref Range   Creatinine, Ser 0.81 0.61 - 1.24 mg/dL   GFR, Estimated >60 >60 mL/min    Comment: (NOTE) Calculated using the CKD-EPI Creatinine Equation (2021) Performed at [facility], [address] [place], [place] [address]   Glucose, capillary     Status: Abnormal   Collection Time: 04/07/22  3:51 PM  Result Value Ref Range   Glucose-Capillary 182 (H) 70 - 99 mg/dL    Comment: Glucose reference range applies only to samples taken after fasting for at least 8 hours.  CBC     Status: None   Collection Time: 04/08/22  4:40 AM  Result Value Ref Range   WBC 10.0 4.0 - 10.5 K/uL   RBC 5.08 4.22 - 5.81 MIL/uL   Hemoglobin 16.0 13.0 - 17.0 g/dL   HCT 46.8 39.0 - 52.0 %   MCV 92.1 80.0 - 100.0 fL   MCH 31.5 26.0 - 34.0 pg   MCHC 34.2 30.0 - 36.0 g/dL   RDW 13.0 11.5 - 15.5 %   Platelets 154 150 - 400 K/uL   nRBC 0.0 0.0 - 0.2 %    Comment: Performed at [facility], [address], [place], [place] [address]  Basic metabolic panel     Status: Abnormal   Collection Time: 04/08/22  4:40 AM  Result Value Ref Range   Sodium 136 135 - 145 mmol/L   Potassium 3.7 3.5 - 5.1 mmol/L   Chloride 110 98 - 111 mmol/L   CO2 20 (L) 22 - 32 mmol/L   Glucose, Bld 145 (H) 70 - 99 mg/dL    Comment: Glucose reference range applies only to samples taken after fasting for at least 8 hours.   BUN 17 8 - 23 mg/dL   Creatinine, Ser 0.81 0.61 - 1.24 mg/dL   Calcium 8.4 (L) 8.9  - 10.3 mg/dL   GFR, Estimated >60 >60 mL/min    Comment: (NOTE) Calculated using the CKD-EPI Creatinine Equation (2021)    Anion gap 6 5 - 15    Comment: Performed at [facility], [address] [place], [place] [address]  Glucose, capillary     Status: Abnormal   Collection Time: 04/08/22  7:57 AM  Result Value Ref Range   Glucose-Capillary 134 (H) 70 - 99 mg/dL    Comment: Glucose reference range applies only to samples taken after fasting for at least 8 hours.   Comment 1 QC Due     Current Facility-Administered Medications  Medication Dose Route Frequency Provider Last Rate Last Admin   acetaminophen (TYLENOL) tablet 325-650 mg  325-650 mg Oral Q6H PRN Earnestine Leys, MD       alum & mag hydroxide-simeth (MAALOX/MYLANTA) 200-200-20 MG/5ML suspension 30 mL  30 mL Oral Q4H PRN Earnestine Leys, MD       bisacodyl (DULCOLAX) suppository 10 mg  10 mg Rectal Daily PRN Earnestine Leys, MD       buPROPion (WELLBUTRIN XL) 24 hr tablet 150 mg  150 mg Oral Daily Agbata, Tochukwu, MD   150 mg at 04/08/22 0915   docusate sodium (COLACE) capsule 100 mg  100 mg Oral BID Earnestine Leys, MD   100 mg at 04/08/22 0914   enoxaparin (LOVENOX) injection 30 mg  30 mg Subcutaneous Q24H Earnestine Leys, MD   30 mg at 04/08/22 6568   ferrous sulfate tablet 325 mg  325 mg Oral Q breakfast Earnestine Leys, MD   325 mg at 04/08/22 0914   HYDROcodone-acetaminophen (NORCO/VICODIN) 5-325 MG per tablet 1-2 tablet  1-2 tablet Oral Q4H PRN Earnestine Leys, MD   2 tablet at 04/08/22 1325   menthol-cetylpyridinium (CEPACOL) lozenge 3 mg  1 lozenge Oral PRN Earnestine Leys, MD       Or   phenol (CHLORASEPTIC) mouth spray 1 spray  1 spray Mouth/Throat PRN Earnestine Leys, MD       methocarbamol (ROBAXIN) tablet 500 mg  500 mg Oral Q6H PRN Agbata, Tochukwu, MD   500 mg at 04/07/22 2111   Or   methocarbamol (ROBAXIN) 500 mg in dextrose 5 % 50 mL IVPB  500 mg Intravenous Q6H PRN Agbata, Tochukwu, MD        metoCLOPramide (REGLAN) tablet 5-10 mg  5-10 mg Oral Q8H PRN Earnestine Leys, MD       Or   metoCLOPramide (REGLAN) injection 5-10 mg  5-10 mg Intravenous Q8H PRN Earnestine Leys, MD       metoprolol tartrate (LOPRESSOR) tablet 25 mg  25 mg Oral BID Agbata, Tochukwu, MD   25 mg at 04/08/22 0915   morphine (PF) 2 MG/ML injection 0.5-1 mg  0.5-1 mg Intravenous Q2H PRN Earnestine Leys, MD       ondansetron [facility]) tablet 4 mg  4 mg Oral Q6H PRN Earnestine Leys, MD       Or   ondansetron [facility]) injection 4 mg  4 mg Intravenous Q6H PRN Earnestine Leys, MD       senna (SENOKOT) tablet 8.6 mg  1 tablet Oral BID Agbata, Tochukwu, MD   8.6 mg at 04/08/22 0914   sodium phosphate (FLEET) 7-19 GM/118ML enema 1 enema  1 enema Rectal Once PRN Earnestine Leys, MD       zolpidem Lorrin Mais) tablet 5 mg  5 mg Oral QHS PRN Earnestine Leys, MD   5 mg at 04/07/22 2213    Musculoskeletal: Strength & Muscle Tone: decreased Gait & Station:  Did not observe Patient leans: N/A            Psychiatric Specialty Exam:  Presentation  General Appearance: Appropriate for Environment  Eye Contact:Good  Speech:Clear and Coherent  Speech Volume:Normal  Handedness:Right   Mood and Affect  Mood:Dysphoric (in pain)  Affect:Blunt; Congruent   Thought Process  Thought Processes:Coherent  Descriptions of Associations:Intact  Orientation:Full (Time, Place and Person)  Thought Content:Logical  History of Schizophrenia/Schizoaffective disorder:No  Duration of Psychotic Symptoms:No data recorded Hallucinations:Hallucinations: None  Ideas of Reference:None  Suicidal Thoughts:Suicidal Thoughts: No  Homicidal Thoughts:Homicidal Thoughts: No   Sensorium  Memory:Immediate Good  Judgment:Good  Insight:Good   Executive Functions  Concentration:Fair  Attention Span:Fair  [place] of Knowledge:Good  Language:Good   Psychomotor Activity  Psychomotor Activity:Psychomotor  Activity: Normal  Assets  Assets:Communication Skills; Desire for Improvement; Financial Resources/Insurance; Housing; Intimacy; Social Support; Resilience   Sleep  Sleep:Sleep: Good  Physical Exam: Physical Exam Vitals and nursing note reviewed.  HENT:     Head: Normocephalic.     Nose: No congestion or rhinorrhea.  Eyes:     General:        Right eye: No discharge.        Left eye: No discharge.  Cardiovascular:     Rate and Rhythm: Normal rate.  Pulmonary:  Effort: Pulmonary effort is normal.  Musculoskeletal:        General: Normal range of motion.     Cervical back: Normal range of motion.  Skin:    General: Skin is dry.  Neurological:     Mental Status: He is oriented to person, place, and time.  Psychiatric:        Attention and Perception: Attention and perception normal.        Mood and Affect: Affect is blunt.        Speech: Speech normal.        Behavior: Behavior normal. Behavior is not agitated, aggressive or hyperactive. Behavior is cooperative.        Thought Content: Thought content normal. Thought content is not paranoid or delusional. Thought content does not include homicidal or suicidal ideation.     Comments:  Sleepy, but able to attend to questions    Review of Systems  Musculoskeletal:  Positive for joint pain.   Blood pressure 111/71, pulse 78, temperature 98.2 F (36.8 C), temperature source Oral, resp. rate 20, SpO2 95 %. There is no height or weight on file to calculate BMI.  Treatment Plan Summary: Daily contact with patient to assess and evaluate symptoms and progress in treatment, Medication management, and Plan follow-up tomorrow .   Disposition:  Undetermined at this time  Sherlon Handing, NP 04/08/2022 6:43 PM

## 2022-04-09 DIAGNOSIS — S72001A Fracture of unspecified part of neck of right femur, initial encounter for closed fracture: Secondary | ICD-10-CM | POA: Diagnosis not present

## 2022-04-09 DIAGNOSIS — F332 Major depressive disorder, recurrent severe without psychotic features: Secondary | ICD-10-CM | POA: Diagnosis not present

## 2022-04-09 DIAGNOSIS — I10 Essential (primary) hypertension: Secondary | ICD-10-CM | POA: Diagnosis not present

## 2022-04-09 DIAGNOSIS — I480 Paroxysmal atrial fibrillation: Secondary | ICD-10-CM | POA: Diagnosis not present

## 2022-04-09 DIAGNOSIS — E860 Dehydration: Secondary | ICD-10-CM | POA: Diagnosis not present

## 2022-04-09 LAB — CBC
HCT: 42.4 % (ref 39.0–52.0)
Hemoglobin: 14.4 g/dL (ref 13.0–17.0)
MCH: 31.6 pg (ref 26.0–34.0)
MCHC: 34 g/dL (ref 30.0–36.0)
MCV: 93.2 fL (ref 80.0–100.0)
Platelets: 116 10*3/uL — ABNORMAL LOW (ref 150–400)
RBC: 4.55 MIL/uL (ref 4.22–5.81)
RDW: 13.2 % (ref 11.5–15.5)
WBC: 9.4 10*3/uL (ref 4.0–10.5)
nRBC: 0 % (ref 0.0–0.2)

## 2022-04-09 LAB — BASIC METABOLIC PANEL
Anion gap: 4 — ABNORMAL LOW (ref 5–15)
BUN: 18 mg/dL (ref 8–23)
CO2: 24 mmol/L (ref 22–32)
Calcium: 8.2 mg/dL — ABNORMAL LOW (ref 8.9–10.3)
Chloride: 107 mmol/L (ref 98–111)
Creatinine, Ser: 0.82 mg/dL (ref 0.61–1.24)
GFR, Estimated: >60 mL/min (ref >60)
Glucose, Bld: 123 mg/dL — ABNORMAL HIGH (ref 70–99)
Potassium: 3.8 mmol/L (ref 3.5–5.1)
Sodium: 135 mmol/L (ref 135–145)

## 2022-04-09 LAB — GLUCOSE, CAPILLARY
Glucose-Capillary: 139 mg/dL — ABNORMAL HIGH (ref 70–99)
Glucose-Capillary: 210 mg/dL — ABNORMAL HIGH (ref 70–99)
Glucose-Capillary: 161 mg/dL — ABNORMAL HIGH (ref 70–99)
Glucose-Capillary: 184 mg/dL — ABNORMAL HIGH (ref 70–99)

## 2022-04-09 LAB — SURGICAL PATHOLOGY

## 2022-04-09 MED ORDER — INSULIN ASPART 100 UNIT/ML IJ SOLN
0.0000 [IU] | Freq: Three times a day (TID) | INTRAMUSCULAR | Status: DC
  Administered 2022-04-09: 2 [IU] via SUBCUTANEOUS
  Administered 2022-04-10: 1 [IU] via SUBCUTANEOUS
  Administered 2022-04-10 (×2): 3 [IU] via SUBCUTANEOUS
  Administered 2022-04-11: 1 [IU] via SUBCUTANEOUS
  Administered 2022-04-11: 2 [IU] via SUBCUTANEOUS
  Filled 2022-04-09 (×6): qty 1

## 2022-04-09 MED ORDER — POLYETHYLENE GLYCOL 3350 17 G PO PACK
17.0000 g | PACK | Freq: Every day | ORAL | Status: DC
  Administered 2022-04-10 – 2022-04-11 (×2): 17 g via ORAL
  Filled 2022-04-09 (×3): qty 1

## 2022-04-09 MED ORDER — ASPIRIN 81 MG PO TBEC
81.0000 mg | DELAYED_RELEASE_TABLET | Freq: Every day | ORAL | Status: DC
  Administered 2022-04-09 – 2022-04-11 (×3): 81 mg via ORAL
  Filled 2022-04-09 (×3): qty 1

## 2022-04-09 MED ORDER — INSULIN ASPART 100 UNIT/ML IJ SOLN: 0.0000 [IU] | INTRAMUSCULAR | Status: DC

## 2022-04-09 NOTE — Evaluation (Signed)
Occupational Therapy Evaluation Patient Details Name: John [name] MRN: [id number] DOB: 10/28/1941 Today's Date: 04/09/2022   History of Present Illness Pt is a 81 yo male with PMH of depression, anxiety, HTN, DM, OSA, prostate cancer, Horner syndrome, dissection of carotid artery, subdural hematoma, epidural hematoma. Previously hospitalized from 6/5-6/8 for severe depression and found to have abnormal EKG with A-fib. Pt receiving ECT for management of depression. Pt admitted 6/10 due to R femoral neck fx due to a fall in the  bathroom on the evening of 6/9. Pt underwent R hip hemiarthroplasty on 6/11.   Clinical Impression   Pt seen for OT evaluation this date, POD#1 from above surgery. Pt was independent in all ADLs prior to surgery, however was receiving treatment for depression/ECT in geriatric-psych unit prior to hip fx. Today pt requires Min/Mod A for bed mobility, transfers, toileting. Pt able to recall posterior total hip precautions, requires assistance to verbalize how to implement during ADL and mobility. Pt reports 5/10 R hip pain supine in bed and 7/10 pain at end of session, following multiple transfers/movement. Pt will benefit from ongoing OT to increase self care skills/fxl mobility and techniques to help maintain precautions. Pt also reports low mood, feelings of "confusion," and verbalizes that he recognizes he needs to continue addressing his mental health needs. Recommend DC to geri-psych unit with ongoing OT/PT provided there to continue rehab for R THA. Pt will require RW; TBD whether he will need BSC and/or shower chair post DC.       Recommendations for follow up therapy are one component of a multi-disciplinary discharge planning process, led by the attending physician.  Recommendations may be updated based on patient status, additional functional criteria and insurance authorization.   Follow Up Recommendations  Other (comment) (Ongoing OT/PT within geri-psych unit)     Assistance Recommended at Discharge    Patient can return home with the following A little help with walking and/or transfers;A little help with bathing/dressing/bathroom;Assistance with cooking/housework;Assist for transportation;Help with stairs or ramp for entrance    Functional Status Assessment  Patient has had a recent decline in their functional status and demonstrates the ability to make significant improvements in function in a reasonable and predictable amount of time.  Equipment Recommendations  BSC/3in1;Tub/shower seat;Other (comment) (RW)    Recommendations for Other Services Other (comment) (Resumption of geri--psych services)     Precautions / Restrictions Precautions Precautions: Posterior Hip;Fall Required Braces or Orthoses: Other Brace Other Brace: Hip ABDuction pillow in supine Restrictions Weight Bearing Restrictions: Yes RLE Weight Bearing: Partial weight bearing RLE Partial Weight Bearing Percentage or Pounds: 25      Mobility Bed Mobility Overal bed mobility: Needs Assistance Bed Mobility: Supine to Sit     Supine to sit: Min assist     General bed mobility comments: Min A for RLE lateral movement    Transfers Overall transfer level: Needs assistance Equipment used: Rolling walker (2 wheels) Transfers: Sit to/from Stand Sit to Stand: Mod assist     Step pivot transfers: Mod assist            Balance Overall balance assessment: Needs assistance Sitting-balance support: Feet supported, No upper extremity supported Sitting balance-Leahy Scale: Good     Standing balance support: Bilateral upper extremity supported, Reliant on assistive device for balance Standing balance-Leahy Scale: Fair Standing balance comment: Pt required RW with bilat UE support to maintain balance in standing due to [facility] precautions of R LE  ADL either performed or assessed with clinical judgement   ADL Overall ADL's : Needs  assistance/impaired Eating/Feeding: Modified independent                       Toilet Transfer: Moderate assistance;Rolling walker (2 wheels);BSC/3in1 Toilet Transfer Details (indicate cue type and reason): Mod A for sit<>stand on BSC, cueing for safe use of RW Toileting- Clothing Manipulation and Hygiene: Moderate assistance Toileting - Clothing Manipulation Details (indicate cue type and reason): Mod A for peri hygenie             Vision Patient Visual Report: No change from baseline       Perception     Praxis      Pertinent Vitals/Pain Pain Assessment Pain Assessment: 0-10 Pain Score: 7  Pain Location: R hip Pain Descriptors / Indicators: Operative site guarding, Discomfort, Grimacing Pain Intervention(s): Limited activity within patient's tolerance, Monitored during session, Repositioned     Hand Dominance Right   Extremity/Trunk Assessment Upper Extremity Assessment Upper Extremity Assessment: Overall WFL for tasks assessed   Lower Extremity Assessment Lower Extremity Assessment: RLE deficits/detail RLE Deficits / Details: requires assistance for elevation and lateral movement of RLE from supine RLE Sensation: WNL   Cervical / Trunk Assessment Cervical / Trunk Assessment: Normal   Communication Communication Communication: No difficulties   Cognition Arousal/Alertness: Awake/alert Behavior During Therapy: Flat affect Overall Cognitive Status: Within Functional Limits for tasks assessed                                 General Comments: Oriented x 4, however reports feeling "confused" and "foggy."     General Comments       Exercises Other Exercises Other Exercises: Educ re: posterior hip precautions, PoC, discharge recs   Shoulder Instructions      Home Living Family/patient expects to be discharged to:: Private residence Living Arrangements: Spouse/significant other Available Help at Discharge: Family Type of Home:  House Home Access: Stairs to enter Technical brewer of Steps: 5 Entrance Stairs-Rails: Can reach both Home Layout: One level     Bathroom Shower/Tub: Other (comment) (chart notes state pt has tub/shower unit; today pt reports he has a walk-in shower)   Bathroom Toilet: Standard     Home Equipment: None          Prior Functioning/Environment Prior Level of Function : Independent/Modified Independent             Mobility Comments: Pt previously ambulated without AD. ADLs Comments: Independent with bathing, dressing, feeding, driving        OT Problem List: Decreased strength;Decreased range of motion;Decreased activity tolerance;Impaired balance (sitting and/or standing);Pain      OT Treatment/Interventions: Self-care/ADL training;Therapeutic exercise;Patient/family education;Balance training;Energy conservation;Therapeutic activities;DME and/or AE instruction    OT Goals(Current goals can be found in the care plan section) Acute Rehab OT Goals Patient Stated Goal: to feel better OT Goal Formulation: With patient Time For Goal Achievement: 04/23/22 Potential to Achieve Goals: Good ADL Goals Pt Will Perform Lower Body Dressing: with modified independence;sit to/from stand Pt Will Transfer to Toilet: with modified independence;regular height toilet (using LRAD) Pt Will Perform Tub/Shower Transfer: with modified independence;Shower transfer;Stand pivot transfer;rolling walker;shower seat  OT Frequency: Min 2X/week    Co-evaluation              AM-PAC OT "6 Clicks" Daily Activity     Outcome Measure Help  from another person eating meals?: None Help from another person taking care of personal grooming?: A Little Help from another person toileting, which includes using toliet, bedpan, or urinal?: A Lot Help from another person bathing (including washing, rinsing, drying)?: A Lot Help from another person to put on and taking off regular upper body clothing?: A  Little Help from another person to put on and taking off regular lower body clothing?: A Lot 6 Click Score: 16   End of Session Equipment Utilized During Treatment: Rolling walker (2 wheels)  Activity Tolerance: Patient tolerated treatment well Patient left: in chair;with nursing/sitter in room  OT Visit Diagnosis: Unsteadiness on feet (R26.81);Muscle weakness (generalized) (M62.81);Pain Pain - Right/Left: Right Pain - part of body: Hand                Time: 7169-6789 OT Time Calculation (min): 45 min Charges:  OT General Charges $OT Visit: 1 Visit OT Evaluation $OT Eval Moderate Complexity: 1 Mod OT Treatments $Self Care/Home Management : 38-52 mins Josiah Lobo, PhD, MS, OTR/L 04/09/22, 10:04 AM

## 2022-04-09 NOTE — Progress Notes (Signed)
Physical Therapy Treatment Patient Details Name: John [name] MRN: [id number] DOB: [birth year] Today's Date: 04/09/2022   History of Present Illness Pt is a 81 yo male with PMH of depression, anxiety, HTN, DM, OSA, prostate cancer, Horner syndrome, dissection of carotid artery, subdural hematoma, epidural hematoma. Previously hospitalized from 6/5-6/8 for severe depression and found to have abnormal EKG with A-fib. Pt receiving ECT for management of depression. Pt admitted 6/10 due to R femoral neck fx due to a fall in the  bathroom on the evening of 6/9. Pt underwent R hip hemiarthroplasty on 6/11.    PT Comments    Pt is making good progress towards goals with ability to recall 2/3 hip precautions. Still requires heavy cues for sequencing and maintaining hip precautions with functional mobility. Hand over hand cues for bed mobility/transfers. Able to reposition in bed with supervision. Recommending return to geripsych unit once medically cleared. Can continue to progress towards goals.   Recommendations for follow up therapy are one component of a multi-disciplinary discharge planning process, led by the attending physician.  Recommendations may be updated based on patient status, additional functional criteria and insurance authorization.  Follow Up Recommendations   (return to [facility])     Assistance Recommended at Discharge Frequent or constant Supervision/Assistance  Patient can return home with the following A little help with walking and/or transfers;A little help with bathing/dressing/bathroom   Equipment Recommendations  Rolling walker (2 wheels);BSC/3in1    Recommendations for Other Services       Precautions / Restrictions Precautions Precautions: Posterior Hip;Fall Precaution Booklet Issued: Yes (comment) Required Braces or Orthoses: Other Brace Other Brace: Hip ABDuction pillow in supine Restrictions Weight Bearing Restrictions: Yes RLE Weight Bearing: Partial weight  bearing RLE Partial Weight Bearing Percentage or Pounds: 50     Mobility  Bed Mobility Overal bed mobility: Needs Assistance Bed Mobility: Supine to Sit, Sit to Supine     Supine to sit: Min assist Sit to supine: Min assist   General bed mobility comments: cues for sequencing. Once seated at EOB, upright posture noted. Cues for maintaining hip precautions    Transfers Overall transfer level: Needs assistance Equipment used: Rolling walker (2 wheels) Transfers: Sit to/from Stand Sit to Stand: Min assist   Step pivot transfers: Min assist       General transfer comment: cues for hand placement. Bed slightly elevated. RW used    Ambulation/Gait Ambulation/Gait assistance: Counsellor (Feet): 80 Feet Assistive device: Rolling walker (2 wheels) Gait Pattern/deviations: Step-to pattern       General Gait Details: ambulated with improved technique, educated on PWB status upgraded to 50%. RW used with slow gait pattern. Needs heavy cues for R turns due to hip precautions.   Stairs             Wheelchair Mobility    Modified Rankin (Stroke Patients Only)       Balance Overall balance assessment: Needs assistance Sitting-balance support: Feet supported, No upper extremity supported Sitting balance-Leahy Scale: Good     Standing balance support: Bilateral upper extremity supported, Reliant on assistive device for balance Standing balance-Leahy Scale: Fair                              Cognition Arousal/Alertness: Awake/alert Behavior During Therapy: WFL for tasks assessed/performed Overall Cognitive Status: Within Functional Limits for tasks assessed  General Comments: sleepy        Exercises Other Exercises Other Exercises: supine ther-ex performed on R LE including AP, quad sets, hip abd/add, SLRs, and LAQ. 12 reps with cga Other Exercises: pt educated on post hip precautions and  WBing status    General Comments        Pertinent Vitals/Pain Pain Assessment Pain Assessment: 0-10 Pain Score: 3  Pain Location: R hip Pain Descriptors / Indicators: Operative site guarding, Discomfort, Grimacing Pain Intervention(s): Limited activity within patient's tolerance    Home Living                          Prior Function            PT Goals (current goals can now be found in the care plan section) Acute Rehab PT Goals Patient Stated Goal: to go home PT Goal Formulation: With patient Time For Goal Achievement: 04/22/22 Potential to Achieve Goals: Good Progress towards PT goals: Progressing toward goals    Frequency    BID      PT Plan Discharge plan needs to be updated    Co-evaluation              AM-PAC PT "6 Clicks" Mobility   Outcome Measure  Help needed turning from your back to your side while in a flat bed without using bedrails?: A Little Help needed moving from lying on your back to sitting on the side of a flat bed without using bedrails?: A Little Help needed moving to and from a bed to a chair (including a wheelchair)?: A Little Help needed standing up from a chair using your arms (e.g., wheelchair or bedside chair)?: A Little Help needed to walk in hospital room?: A Little Help needed climbing 3-5 steps with a railing? : Total 6 Click Score: 16    End of Session Equipment Utilized During Treatment: Gait belt Activity Tolerance: Patient limited by pain Patient left: in bed;with nursing/sitter in room;with SCD's reapplied Nurse Communication: Mobility status PT Visit Diagnosis: Muscle weakness (generalized) (M62.81);History of falling (Z91.81);Pain Pain - Right/Left: Right Pain - part of body: Hip     Time: 2458-0998 PT Time Calculation (min) (ACUTE ONLY): 23 min  Charges:  $Gait Training: 8-22 mins $Therapeutic Exercise: 8-22 mins                     John [name], PT, DPT,  GCS [phone]    John [name] 04/09/2022, 2:34 PM

## 2022-04-09 NOTE — Progress Notes (Signed)
Physical Therapy Treatment Patient Details Name: John [name] MRN: [id number] DOB: 1941/03/13 Today's Date: 04/09/2022   History of Present Illness Pt is a 81 yo male with PMH of depression, anxiety, HTN, DM, OSA, prostate cancer, Horner syndrome, dissection of carotid artery, subdural hematoma, epidural hematoma. Previously hospitalized from 6/5-6/8 for severe depression and found to have abnormal EKG with A-fib. Pt receiving ECT for management of depression. Pt admitted 6/10 due to R femoral neck fx due to a fall in the  bathroom on the evening of 6/9. Pt underwent R hip hemiarthroplasty on 6/11.    PT Comments    Pt is making good progress towards goals with ability to tolerate further mobility distance this date and demonstrates improved safety awareness. Pt with improved affect and can recall 2/3 hip precautions. Reviewed and completed there-ex, will continue to progress. Continue to recommend SNF at this time due to frequent need for [facility] reminders and frequent cues for mobility.    Recommendations for follow up therapy are one component of a multi-disciplinary discharge planning process, led by the attending physician.  Recommendations may be updated based on patient status, additional functional criteria and insurance authorization.  Follow Up Recommendations  Skilled nursing-short term rehab (<3 hours/day)     Assistance Recommended at Discharge Frequent or constant Supervision/Assistance  Patient can return home with the following A little help with walking and/or transfers;A little help with bathing/dressing/bathroom   Equipment Recommendations  Rolling walker (2 wheels);BSC/3in1    Recommendations for Other Services       Precautions / Restrictions Precautions Precautions: Posterior Hip;Fall Precaution Booklet Issued: Yes (comment) Required Braces or Orthoses: Other Brace Other Brace: Hip ABDuction pillow in supine Restrictions Weight Bearing Restrictions: Yes RLE Weight  Bearing: Partial weight bearing RLE Partial Weight Bearing Percentage or Pounds: 25     Mobility  Bed Mobility               General bed mobility comments: NT- received in recliner    Transfers Overall transfer level: Needs assistance Equipment used: Rolling walker (2 wheels) Transfers: Sit to/from Stand Sit to Stand: Min assist   Step pivot transfers: Min assist       General transfer comment: needs assist for hand/leg placement prior to transfer. Min assist for transfer with heavy cues for sequencing. Once standing, able to maintain upright posture and proper WBing status    Ambulation/Gait Ambulation/Gait assistance: Min assist Gait Distance (Feet): 40 Feet Assistive device: Rolling walker (2 wheels) Gait Pattern/deviations: Antalgic, Decreased step length - right, Decreased step length - left, Decreased stride length, Shuffle       General Gait Details: ambulated in room with frequent cues for WBing status and assist with turns to maintain hip precautions. RW used   Marine scientist Rankin (Stroke Patients Only)       Balance Overall balance assessment: Needs assistance Sitting-balance support: Feet supported, No upper extremity supported Sitting balance-Leahy Scale: Good     Standing balance support: Bilateral upper extremity supported, Reliant on assistive device for balance Standing balance-Leahy Scale: Fair                              Cognition Arousal/Alertness: Awake/alert Behavior During Therapy: Flat affect Overall Cognitive Status: Within Functional Limits for tasks assessed  General Comments: alert and pleasant during session, improved affect compared to yesterday        Exercises Other Exercises Other Exercises: seated ther-ex on R LE including AP, quad sets, glut sets, hip abd/add, and LAQ. 12 reps with cga/min assist Other  Exercises: pt educated on post hip precautions and WBing status    General Comments        Pertinent Vitals/Pain Pain Assessment Pain Assessment: 0-10 Pain Score: 6  Pain Location: R hip Pain Descriptors / Indicators: Operative site guarding, Discomfort, Grimacing Pain Intervention(s): Limited activity within patient's tolerance, Repositioned    Home Living Family/patient expects to be discharged to:: Private residence Living Arrangements: Spouse/significant other Available Help at Discharge: Family Type of Home: House Home Access: Stairs to enter Entrance Stairs-Rails: Can reach both Entrance Stairs-Number of Steps: 5   Home Layout: One level Home Equipment: None      Prior Function            PT Goals (current goals can now be found in the care plan section) Acute Rehab PT Goals Patient Stated Goal: to go home PT Goal Formulation: With patient Time For Goal Achievement: 04/22/22 Potential to Achieve Goals: Good Progress towards PT goals: Progressing toward goals    Frequency    BID      PT Plan Current plan remains appropriate    Co-evaluation              AM-PAC PT "6 Clicks" Mobility   Outcome Measure  Help needed turning from your back to your side while in a flat bed without using bedrails?: A Little Help needed moving from lying on your back to sitting on the side of a flat bed without using bedrails?: A Little Help needed moving to and from a bed to a chair (including a wheelchair)?: A Little Help needed standing up from a chair using your arms (e.g., wheelchair or bedside chair)?: A Little Help needed to walk in hospital room?: A Lot Help needed climbing 3-5 steps with a railing? : Total 6 Click Score: 15    End of Session Equipment Utilized During Treatment: Gait belt Activity Tolerance: Patient limited by pain Patient left: in chair (with sitter present) Nurse Communication: Mobility status PT Visit Diagnosis: Muscle weakness  (generalized) (M62.81);History of falling (Z91.81);Pain Pain - Right/Left: Right Pain - part of body: Hip     Time: 1191-4782 PT Time Calculation (min) (ACUTE ONLY): 27 min  Charges:  $Gait Training: 8-22 mins $Therapeutic Exercise: 8-22 mins                     [name], PT, DPT, GCS [phone]    [name] 04/09/2022, 12:28 PM

## 2022-04-09 NOTE — Consult Note (Signed)
[facility] Consult   Reason for Consult:  follow up Referring Physician:  Amery Patient Identification: John [name] MRN:  [id number] Principal Diagnosis: Severe recurrent major depression without psychotic features ([place]) Diagnosis:  Principal Problem:   Severe recurrent major depression without psychotic features ([place]) Active Problems:   Essential hypertension   Paroxysmal atrial fibrillation (HCC)   Type 2 diabetes mellitus with hyperlipidemia ([place])   Dehydration   Fracture of femoral neck ([place])   Total Time spent with patient: 20 minutes  Subjective: "I'm a little anxious"   John [name] is a 81 y.o. male patient admitted with see previous note .  HPI:  Patient states he is a little anxious, recalls some of what brought him to the hospital in the first place. Realizes he had a fall and broke hip. After explaining circumstances, patient was able to recall pieces of events.  Patient speaks in clear, coherent sentences. He is not sleepy, is alert and oriented x 4,  attending to questions appropriately. Affect appears to be somewhat flat, but at baseline. He reports that his pain is under control    Patient is calm and cooperative. He is able to state that he knows he is going to rehab to complete rehab for his hip. He denies suicidal thoughts. He has had no unsafe behaviors. Writer assesses that patient does not need a 1:1 sitter for psychiatric safety and order was d/c'd at request of attending. Dr. Kurtis Bushman. Reviewed with Dr. Weber Cooks.   As requested by Dr. Kurtis Bushman: Writer called patient's wife, who asked me to call patient's daughter Eustaquio Maize, [phone]), as she felt John [name] could understand things better. Writer advised John [name] that patient's expected discharge date from the medical unit is 6/15 and  that patient will not be returning to geriatric psychiatry unit  at this time. John [name] agreed that patient needs to go to rehab for his hip, as patient's wife would not be able to care for  him at home. We discussed finding outpatient providers that patient feels he can relate to and they will be researching this. Advised family that patient can return to ED as indicated as necessary after he has been discharged from rehab for his hip. Encouraged finding outpatient providers.    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 06/2015 CT Head: abnl thickening of mid-dist R cervical ICA w/o narrowing - ? vasculitis and thrombosed/healing dissection; b. 01/2021 Carotid U/S: 1-39% bilat ICA stenoses.   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 Age 54 - struck in head w/ baseball --> s/p craniotomy.   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 Approx 2015 -  eval in [place] - conservatively managed.    Past Surgical History:  Procedure Laterality Date   ANKLE ARTHROSCOPY Left    fracture   CHOLECYSTECTOMY  2007   [place]   left frontal craniotomy for epidural hematoma   HIP ARTHROPLASTY Right 04/07/2022   Procedure: ARTHROPLASTY BIPOLAR HIP (HEMIARTHROPLASTY);  Surgeon: Earnestine Leys, MD;  Location: [facility] ORS;  Service: Orthopedics;  Laterality: Right;   INSERTION OF MESH  01/02/2022   Procedure: INSERTION OF MESH;  Surgeon: Herbert Pun, MD;  Location: [facility] ORS;  Service: General;;   LIGAMENT REPAIR Right    thumb   RETINAL DETACHMENT  SURGERY     TREATMENT FISTULA ANAL  2006   Family History:  Family History  Problem Relation Age of Onset   Osteoporosis Mother    Depression Mother    Prostate cancer Father    Hypertension Father    Diabetes Brother    Kidney disease Neg Hx    Family Psychiatric  History:  Social History:  Social History   Substance and Sexual Activity  Alcohol Use Not Currently     Social History   Substance and Sexual Activity  Drug Use No    Social History   Socioeconomic History   Marital status: Married    Spouse name: [name]   Number of children: 2   Years of education: Not on file   Highest education level: Not on file  Occupational History   Not on file  Tobacco Use   Smoking status: Never   Smokeless tobacco: Never  Vaping Use   Vaping Use: Never used  Substance and Sexual Activity   Alcohol use: Not Currently   Drug use: No   Sexual activity: Yes    Birth control/protection: None  Other Topics Concern   Not on file  Social History Narrative   Live at home with wife.    Social Determinants of Health   Financial Resource Strain: Not on file  Food Insecurity: Not on file  Transportation Needs: Not on file  Physical Activity: Not on file  Stress: Not on file  Social Connections: Not on file   Additional Social History:    Allergies:  No Known Allergies  Labs:  Results for orders placed or performed during the hospital encounter of 04/06/22 (from the past 48 hour(s))  CBC     Status: None   Collection Time: 04/08/22  4:40 AM  Result Value Ref Range   WBC 10.0 4.0 - 10.5 K/uL   RBC 5.08 4.22 - 5.81 MIL/uL   Hemoglobin 16.0 13.0 - 17.0 g/dL   HCT 46.8 39.0 - 52.0 %   MCV 92.1 80.0 - 100.0 fL   MCH 31.5 26.0 - 34.0 pg   MCHC 34.2 30.0 - 36.0 g/dL   RDW 13.0 11.5 - 15.5 %   Platelets 154 150 - 400 K/uL   nRBC 0.0 0.0 - 0.2 %    Comment: Performed at [facility], [address] [place], [place] [address]  Basic metabolic panel     Status:  Abnormal   Collection Time: 04/08/22  4:40 AM  Result Value Ref Range   Sodium 136 135 - 145 mmol/L   Potassium 3.7 3.5 - 5.1 mmol/L   Chloride 110 98 - 111 mmol/L   CO2 20 (L) 22 - 32 mmol/L   Glucose, Bld 145 (H) 70 - 99 mg/dL    Comment: Glucose reference range applies only to samples taken after fasting for  at least 8 hours.   BUN 17 8 - 23 mg/dL   Creatinine, Ser 0.81 0.61 - 1.24 mg/dL   Calcium 8.4 (L) 8.9 - 10.3 mg/dL   GFR, Estimated >60 >60 mL/min    Comment: (NOTE) Calculated using the CKD-EPI Creatinine Equation (2021)    Anion gap 6 5 - 15    Comment: Performed at [facility], [address] [place], [place] [address]  Glucose, capillary     Status: Abnormal   Collection Time: 04/08/22  7:57 AM  Result Value Ref Range   Glucose-Capillary 134 (H) 70 - 99 mg/dL    Comment: Glucose reference range applies only to samples taken after fasting for at least 8 hours.   Comment 1 QC Due   Glucose, capillary     Status: Abnormal   Collection Time: 04/08/22 10:12 PM  Result Value Ref Range   Glucose-Capillary 161 (H) 70 - 99 mg/dL    Comment: Glucose reference range applies only to samples taken after fasting for at least 8 hours.   Comment 1 Notify RN   CBC     Status: Abnormal   Collection Time: 04/09/22  4:04 AM  Result Value Ref Range   WBC 9.4 4.0 - 10.5 K/uL   RBC 4.55 4.22 - 5.81 MIL/uL   Hemoglobin 14.4 13.0 - 17.0 g/dL   HCT 42.4 39.0 - 52.0 %   MCV 93.2 80.0 - 100.0 fL   MCH 31.6 26.0 - 34.0 pg   MCHC 34.0 30.0 - 36.0 g/dL   RDW 13.2 11.5 - 15.5 %   Platelets 116 (L) 150 - 400 K/uL    Comment: REPEATED TO VERIFY   nRBC 0.0 0.0 - 0.2 %    Comment: Performed at [facility], [address] [place], [place] [address]  Basic metabolic panel     Status: Abnormal   Collection Time: 04/09/22  4:04 AM  Result Value Ref Range   Sodium 135 135 - 145 mmol/L   Potassium 3.8 3.5 - 5.1 mmol/L   Chloride 107 98 - 111 mmol/L   CO2 24 22 - 32 mmol/L    Glucose, Bld 123 (H) 70 - 99 mg/dL    Comment: Glucose reference range applies only to samples taken after fasting for at least 8 hours.   BUN 18 8 - 23 mg/dL   Creatinine, Ser 0.82 0.61 - 1.24 mg/dL   Calcium 8.2 (L) 8.9 - 10.3 mg/dL   GFR, Estimated >60 >60 mL/min    Comment: (NOTE) Calculated using the CKD-EPI Creatinine Equation (2021)    Anion gap 4 (L) 5 - 15    Comment: Performed at [facility], [address] [place],  [address]  Glucose, capillary     Status: Abnormal   Collection Time: 04/09/22  8:09 AM  Result Value Ref Range   Glucose-Capillary 139 (H) 70 - 99 mg/dL    Comment: Glucose reference range applies only to samples taken after fasting for at least 8 hours.  Glucose, capillary     Status: Abnormal   Collection Time: 04/09/22 11:16 AM  Result Value Ref Range   Glucose-Capillary 210 (H) 70 - 99 mg/dL    Comment: Glucose reference range applies only to samples taken after fasting for at least 8 hours.  Glucose, capillary     Status: Abnormal   Collection Time: 04/09/22  5:01 PM  Result Value Ref Range   Glucose-Capillary 161 (H) 70 - 99 mg/dL  Comment: Glucose reference range applies only to samples taken after fasting for at least 8 hours.    Current Facility-Administered Medications  Medication Dose Route Frequency Provider Last Rate Last Admin   acetaminophen (TYLENOL) tablet 325-650 mg  325-650 mg Oral Q6H PRN Earnestine Leys, MD       alum & mag hydroxide-simeth (MAALOX/MYLANTA) 200-200-20 MG/5ML suspension 30 mL  30 mL Oral Q4H PRN Earnestine Leys, MD       aspirin EC tablet 81 mg  81 mg Oral Daily Nolberto Hanlon, MD   81 mg at 04/09/22 1701   bisacodyl (DULCOLAX) suppository 10 mg  10 mg Rectal Daily PRN Earnestine Leys, MD   10 mg at 04/09/22 0446   buPROPion (WELLBUTRIN XL) 24 hr tablet 150 mg  150 mg Oral Daily Agbata, Tochukwu, MD   150 mg at 04/09/22 1032   docusate sodium (COLACE) capsule 100 mg  100 mg Oral BID Earnestine Leys, MD    100 mg at 04/09/22 1031   enoxaparin (LOVENOX) injection 30 mg  30 mg Subcutaneous Q24H Earnestine Leys, MD   30 mg at 04/09/22 1610   ferrous sulfate tablet 325 mg  325 mg Oral Q breakfast Earnestine Leys, MD   325 mg at 04/09/22 0819   HYDROcodone-acetaminophen (NORCO/VICODIN) 5-325 MG per tablet 1-2 tablet  1-2 tablet Oral Q4H PRN Earnestine Leys, MD   1 tablet at 04/08/22 2254   insulin aspart (novoLOG) injection 0-9 Units  0-9 Units Subcutaneous TID WC Nolberto Hanlon, MD   2 Units at 04/09/22 1707   menthol-cetylpyridinium (CEPACOL) lozenge 3 mg  1 lozenge Oral PRN Earnestine Leys, MD       Or   phenol (CHLORASEPTIC) mouth spray 1 spray  1 spray Mouth/Throat PRN Earnestine Leys, MD       methocarbamol (ROBAXIN) tablet 500 mg  500 mg Oral Q6H PRN Agbata, Tochukwu, MD   500 mg at 04/07/22 2111   Or   methocarbamol (ROBAXIN) 500 mg in dextrose 5 % 50 mL IVPB  500 mg Intravenous Q6H PRN Agbata, Tochukwu, MD       metoCLOPramide (REGLAN) tablet 5-10 mg  5-10 mg Oral Q8H PRN Earnestine Leys, MD       Or   metoCLOPramide (REGLAN) injection 5-10 mg  5-10 mg Intravenous Q8H PRN Earnestine Leys, MD       metoprolol tartrate (LOPRESSOR) tablet 25 mg  25 mg Oral BID Agbata, Tochukwu, MD   25 mg at 04/09/22 1031   morphine (PF) 2 MG/ML injection 0.5-1 mg  0.5-1 mg Intravenous Q2H PRN Earnestine Leys, MD       ondansetron [facility]) tablet 4 mg  4 mg Oral Q6H PRN Earnestine Leys, MD       Or   ondansetron [facility]) injection 4 mg  4 mg Intravenous Q6H PRN Earnestine Leys, MD       polyethylene glycol (MIRALAX / GLYCOLAX) packet 17 g  17 g Oral Daily Nolberto Hanlon, MD       senna (SENOKOT) tablet 8.6 mg  1 tablet Oral BID Agbata, Tochukwu, MD   8.6 mg at 04/09/22 1032   sodium phosphate (FLEET) 7-19 GM/118ML enema 1 enema  1 enema Rectal Once PRN Earnestine Leys, MD       zolpidem Lorrin Mais) tablet 5 mg  5 mg Oral QHS PRN Earnestine Leys, MD   5 mg at 04/07/22 2213    Musculoskeletal: Strength & Muscle Tone:  did not  assess Gait & Station:  did not  assess Patient leans: N/A  Psychiatric Specialty Exam:  Presentation  General Appearance: Appropriate for Environment  Eye Contact:Good  Speech:Clear and Coherent  Speech Volume:Normal  Handedness:Right   Mood and Affect  Mood:Anxious (stated "a little anxious")  Affect:Congruent   Thought Process  Thought Processes:Linear; Coherent; Goal Directed  Descriptions of Associations:Intact  Orientation:Full (Time, Place and Person)  Thought Content:Logical; WDL  History of Schizophrenia/Schizoaffective disorder:No  Duration of Psychotic Symptoms:No data recorded Hallucinations:Hallucinations: None  Ideas of Reference:None  Suicidal Thoughts:Suicidal Thoughts: No  Homicidal Thoughts:Homicidal Thoughts: No   Sensorium  Memory:Immediate Good  Judgment:Good  Insight:Good   Executive Functions  Concentration:Good  Attention Span:Good  [place] of Knowledge:Good  Language:Good   Psychomotor Activity  Psychomotor Activity:Psychomotor Activity: Normal   Assets  Assets:Housing; Catering manager; Desire for Improvement; Communication Skills; Resilience; Social Support   Sleep  Sleep:Sleep: Good   Physical Exam: Physical Exam Vitals and nursing note reviewed.  HENT:     Head: Normocephalic.     Nose: No congestion or rhinorrhea.  Eyes:     General:        Right eye: No discharge.        Left eye: No discharge.  Cardiovascular:     Rate and Rhythm: Normal rate.  Musculoskeletal:        General: Normal range of motion.     Cervical back: Normal range of motion.  Skin:    General: Skin is dry.  Neurological:     Mental Status: He is alert and oriented to person, place, and time.  Psychiatric:        Attention and Perception: Attention normal.        Mood and Affect: Mood normal. Affect is blunt (stable/at baselline).        Behavior: Behavior normal.        Thought Content: Thought  content is not paranoid or delusional. Thought content does not include homicidal or suicidal ideation.        Cognition and Memory: Cognition normal.        Judgment: Judgment normal.    Review of Systems  Musculoskeletal:        Recovering from hip  hemiarthroplasty  Psychiatric/Behavioral:  Positive for depression (stable). Negative for hallucinations, substance abuse and suicidal ideas. The patient is nervous/anxious (stable). The patient does not have insomnia.    Blood pressure 119/82, pulse 78, temperature 99 F (37.2 C), temperature source Oral, resp. rate 18, SpO2 93 %. There is no height or weight on file to calculate BMI.  Treatment Plan Summary: Plan Patient has expected discharge date from the medical floor for on 04/11/22 to go to short term rehab to recover from femoral head fracture. He is not expected to return to geriatric psychiatry unit at this time. Patient to follow up outpatient. Reviewed with Dr. Naida Sleight and Dr. Weber Cooks.    Disposition: No evidence of imminent risk to self or others at present.   Supportive therapy provided about ongoing stressors.  Sherlon Handing, NP 04/09/2022 5:36 PM

## 2022-04-09 NOTE — Plan of Care (Signed)

## 2022-04-09 NOTE — TOC Progression Note (Signed)
Transition of Care [facility]) - Progression Note    Patient Details  Name: Elisha Cooksey MRN: [id number] Date of Birth: 21-Apr-1941  Transition of Care [facility]) CM/SW [place], RN Phone Number: 04/09/2022, 3:50 PM  Clinical Narrative:    PASSR is pending, [place] sent, Psych has cleared the patient to go to [facility] SNF   Expected Discharge Plan: [place] Barriers to Discharge: Continued Medical Work up  Expected Discharge Plan and Services Expected Discharge Plan: [place] In-house Referral: Clinical Social Work   Post Acute Care Choice:  (TBD) Living arrangements for the past 2 months: Single Family Home                                       Social Determinants of Health (SDOH) Interventions    Readmission Risk Interventions     No data to display

## 2022-04-09 NOTE — NC FL2 (Signed)
[place] LEVEL OF CARE SCREENING TOOL     IDENTIFICATION  Patient Name: John [name] Birthdate: 1940-12-01 Sex: male Admission Date (Current Location): 04/06/2022  [facility] and [place] Number:  Engineering geologist and Address:  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   Severe recurrent major depression without psychotic features ([place]) 04/04/2022   Type 2 diabetes mellitus with hyperlipidemia (HCC)    Paroxysmal atrial fibrillation ([place]) 04/02/2022   Depression with anxiety 04/01/2022   Essential hypertension    MDD (major depressive disorder) 03/13/2022   History of seizure 06/21/2016   Headache, migraine 12/22/2015   Malignant neoplasm of prostate ([place]) 06/19/2015   Prostate cancer ([place]) 06/04/2015   Clinical depression 06/13/2014   BP (high blood pressure) 06/13/2014   Apnea, sleep 06/13/2014   Recurrent major depressive disorder, in partial remission ([place]) 06/13/2014    Orientation RESPIRATION BLADDER Height & Weight     Self, Time, Situation, Place  Normal Continent Weight:   Height:     BEHAVIORAL SYMPTOMS/MOOD NEUROLOGICAL BOWEL NUTRITION STATUS      Continent Diet (see DC summary)  AMBULATORY STATUS COMMUNICATION OF NEEDS Skin   Extensive Assist Verbally Normal                       Personal Care Assistance Level of Assistance  Bathing, Feeding, Dressing Bathing Assistance: Limited assistance Feeding  assistance: Independent Dressing Assistance: Limited assistance     Functional Limitations Info             SPECIAL CARE FACTORS FREQUENCY  PT (By licensed PT), OT (By licensed OT)     PT Frequency: 5 times per week OT Frequency: 5 times per week            Contractures Contractures Info: Not present    Additional Factors Info  Code Status, Allergies Code Status Info: full cide Allergies Info: NKDA           Current Medications (04/09/2022):  This is the current hospital active medication list Current Facility-Administered Medications  Medication Dose Route Frequency Provider Last Rate Last Admin   acetaminophen (TYLENOL) tablet 325-650 mg  325-650 mg Oral Q6H PRN Earnestine Leys, MD       alum & mag hydroxide-simeth (MAALOX/MYLANTA) 200-200-20 MG/5ML suspension 30 mL  30 mL Oral Q4H PRN Earnestine Leys, MD       aspirin EC tablet 81 mg  81 mg Oral Daily Nolberto Hanlon, MD       bisacodyl (DULCOLAX) suppository 10 mg  10 mg Rectal Daily PRN Earnestine Leys, MD   10 mg at 04/09/22 0446   buPROPion (WELLBUTRIN XL) 24 hr tablet 150 mg  150 mg Oral Daily Agbata, Tochukwu, MD   150 mg at 04/09/22 1032   docusate sodium (COLACE) capsule 100 mg  100 mg Oral BID Earnestine Leys, MD   100 mg at 04/09/22 1031   enoxaparin (LOVENOX) injection 30 mg  30 mg Subcutaneous Q24H Earnestine Leys, MD   30 mg at 04/09/22 2585   ferrous sulfate tablet 325 mg  325 mg Oral Q breakfast Earnestine Leys, MD   325 mg at 04/09/22 0819   HYDROcodone-acetaminophen (NORCO/VICODIN) 5-325 MG per tablet 1-2 tablet  1-2 tablet Oral Q4H PRN Earnestine Leys, MD   1 tablet at 04/08/22 2254   insulin aspart (novoLOG) injection 0-9 Units  0-9 Units Subcutaneous TID WC Nolberto Hanlon, MD       menthol-cetylpyridinium (CEPACOL) lozenge 3 mg  1 lozenge Oral PRN Earnestine Leys, MD       Or   phenol (CHLORASEPTIC) mouth spray 1 spray  1 spray Mouth/Throat PRN Earnestine Leys, MD       methocarbamol (ROBAXIN) tablet 500 mg   500 mg Oral Q6H PRN Agbata, Tochukwu, MD   500 mg at 04/07/22 2111   Or   methocarbamol (ROBAXIN) 500 mg in dextrose 5 % 50 mL IVPB  500 mg Intravenous Q6H PRN Agbata, Tochukwu, MD       metoCLOPramide (REGLAN) tablet 5-10 mg  5-10 mg Oral Q8H PRN Earnestine Leys, MD       Or   metoCLOPramide (REGLAN) injection 5-10 mg  5-10 mg Intravenous Q8H PRN Earnestine Leys, MD       metoprolol tartrate (LOPRESSOR) tablet 25 mg  25 mg Oral BID Agbata, Tochukwu, MD   25 mg at 04/09/22 1031   morphine (PF) 2 MG/ML injection 0.5-1 mg  0.5-1 mg Intravenous Q2H PRN Earnestine Leys, MD       ondansetron [facility]) tablet 4 mg  4 mg Oral Q6H PRN Earnestine Leys, MD       Or   ondansetron [facility]) injection 4 mg  4 mg Intravenous Q6H PRN Earnestine Leys, MD       polyethylene glycol (MIRALAX / GLYCOLAX) packet 17 g  17 g Oral Daily Nolberto Hanlon, MD       senna (SENOKOT) tablet 8.6 mg  1 tablet Oral BID Agbata, Tochukwu, MD   8.6 mg at 04/09/22 1032   sodium phosphate (FLEET) 7-19 GM/118ML enema 1 enema  1 enema Rectal Once PRN Earnestine Leys, MD       zolpidem Lorrin Mais) tablet 5 mg  5 mg Oral QHS PRN Earnestine Leys, MD   5 mg at 04/07/22 2213     Discharge Medications: Please see discharge summary for a list of discharge medications.  Relevant Imaging Results:  Relevant Lab Results:   Additional Information SS# [id number]  Conception Oms, RN

## 2022-04-09 NOTE — TOC Progression Note (Signed)
Transition of Care [facility]) - Progression Note    Patient Details  Name: John [name] MRN: [id number] Date of Birth: 07/27/1941  Transition of Care [facility]) CM/SW [place], RN Phone Number: 04/09/2022, 4:11 PM  Clinical Narrative:    [place]oydene Flock sent, PASSR pending; Patient is agreeable to go to [facility] SNF   Expected Discharge Plan: [place] Barriers to Discharge: Continued Medical Work up  Expected Discharge Plan and Services Expected Discharge Plan: [place] In-house Referral: Clinical Social Work   Post Acute Care Choice:  (TBD) Living arrangements for the past 2 months: Single Family Home                                       Social Determinants of Health (SDOH) Interventions    Readmission Risk Interventions     No data to display

## 2022-04-09 NOTE — Progress Notes (Signed)
PROGRESS NOTE    John [name]  ZOX:[id number] DOB: [birth year] DOA: 04/06/2022 PCP: Juluis Pitch, MD    Brief Narrative:   John [name] is a 81 y.o. male with medical history significant for paroxysmal A-fib, depression, diabetes mellitus, history of subdural hematoma, hypertension who was admitted to the [facility] psych unit for management of depression for which she has been getting ECT. Patient states that he got up to use the bathroom last night and fell.  He denies feeling dizzy or lightheaded and states that he thinks he tripped and fell.  He got back in bed and this fall was unwitnessed.  This morning he started complaining of pain in the right hip and groin area which he rated a 7 x 10 in intensity at its worst.  He has been unable to bear weight on his right leg and was moved from his room in a recliner. X-ray showed displaced fracture in the neck of right femur.   6/12 status post right hip arthroplasty by Dr. Sabra Heck on 6/11 6/13 PT rec. SNF. Will need clearance from psychiatry for discharge to snf. Currently with sitter    Consultants:  orthopedics  Procedures:   Antimicrobials:      Subjective: Hip pain managed. Small bm today. No new complaints.  Objective: Vitals:   04/08/22 2254 04/09/22 0410 04/09/22 0741 04/09/22 1132  BP:  135/82 128/86 122/76  Pulse: 80 67 72 89  Resp:  '16 15 16  '$ Temp:   98 F (36.7 C) 98.6 F (37 C)  TempSrc:   Oral Oral  SpO2:  98% 98% 97%    Intake/Output Summary (Last 24 hours) at 04/09/2022 1227 Last data filed at 04/09/2022 0730 Gross per 24 hour  Intake 986.95 ml  Output 450 ml  Net 536.95 ml   There were no vitals filed for this visit.  Examination: Calm, NAD Cta no w/r Reg s1/s2 no gallop Soft benign +bs No edema Aaoxox3  Mood and affect appropriate in current setting     Data Reviewed: I have personally reviewed following labs and imaging studies  CBC: Recent Labs  Lab 04/04/22 0525 04/05/22 0724  04/07/22 1434 04/08/22 0440 04/09/22 0404  WBC 7.2 7.7 12.8* 10.0 9.4  HGB 19.2* 19.6* 15.8 16.0 14.4  HCT 54.8* 58.8* 48.2 46.8 42.4  MCV 90.1 95.0 95.8 92.1 93.2  PLT 200 247 149* 154 811*   Basic Metabolic Panel: Recent Labs  Lab 04/03/22 0313 04/04/22 0525 04/05/22 0724 04/06/22 0736 04/07/22 1434 04/08/22 0440 04/09/22 0404  NA 141 139 140 140  --  136 135  K 4.3 3.5 3.4* 4.2  --  3.7 3.8  CL 108 113* 108 112*  --  110 107  CO2 28 17* 19* 25  --  20* 24  GLUCOSE 103* 115* 162* 115*  --  145* 123*  BUN 21 20 26* 38*  --  17 18  CREATININE 0.97 0.71 1.43* 1.12 0.81 0.81 0.82  CALCIUM 9.1 9.1 9.1 9.2  --  8.4* 8.2*  MG 2.5*  --   --   --   --   --   --    GFR: Estimated Creatinine Clearance: 84.9 mL/min (by C-G formula based on SCr of 0.82 mg/dL). Liver Function Tests: No results for input(s): "AST", "ALT", "ALKPHOS", "BILITOT", "PROT", "ALBUMIN" in the last 168 hours. No results for input(s): "LIPASE", "AMYLASE" in the last 168 hours. No results for input(s): "AMMONIA" in the last 168 hours. Coagulation Profile: No results for  input(s): "INR", "PROTIME" in the last 168 hours. Cardiac Enzymes: Recent Labs  Lab 04/02/22 1323  CKTOTAL 134   BNP (last 3 results) No results for input(s): "PROBNP" in the last 8760 hours. HbA1C: No results for input(s): "HGBA1C" in the last 72 hours. CBG: Recent Labs  Lab 04/07/22 1551 04/08/22 0757 04/08/22 2212 04/09/22 0809 04/09/22 1116  GLUCAP 182* 134* 161* 139* 210*   Lipid Profile: No results for input(s): "CHOL", "HDL", "LDLCALC", "TRIG", "CHOLHDL", "LDLDIRECT" in the last 72 hours. Thyroid Function Tests: No results for input(s): "TSH", "T4TOTAL", "FREET4", "T3FREE", "THYROIDAB" in the last 72 hours. Anemia Panel: No results for input(s): "VITAMINB12", "FOLATE", "FERRITIN", "TIBC", "IRON", "RETICCTPCT" in the last 72 hours. Sepsis Labs: No results for input(s): "PROCALCITON", "LATICACIDVEN" in the last 168  hours.  Recent Results (from the past 240 hour(s))  SARS Coronavirus 2 by RT PCR (hospital order, performed in [facility] hospital lab) *cepheid single result test* Anterior Nasal Swab     Status: None   Collection Time: 04/04/22  3:08 PM   Specimen: Anterior Nasal Swab  Result Value Ref Range Status   SARS Coronavirus 2 by RT PCR NEGATIVE NEGATIVE Final    Comment: (NOTE) SARS-CoV-2 target nucleic acids are NOT DETECTED.  The SARS-CoV-2 RNA is generally detectable in upper and lower respiratory specimens during the acute phase of infection. The lowest concentration of SARS-CoV-2 viral copies this assay can detect is 250 copies / mL. A negative result does not preclude SARS-CoV-2 infection and should not be used as the sole basis for treatment or other patient management decisions.  A negative result may occur with improper specimen collection / handling, submission of specimen other than nasopharyngeal swab, presence of viral mutation(s) within the areas targeted by this assay, and inadequate number of viral copies (<250 copies / mL). A negative result must be combined with clinical observations, patient history, and epidemiological information.  Fact Sheet for Patients:   https://www.patel.info/  Fact Sheet for Healthcare Providers: https://hall.com/  This test is not yet approved or  cleared by the [place] FDA and has been authorized for detection and/or diagnosis of SARS-CoV-2 by FDA under an Emergency Use Authorization (EUA).  This EUA will remain in effect (meaning this test can be used) for the duration of the COVID-19 declaration under Section 564(b)(1) of the Act, 21 U.S.C. section 360bbb-3(b)(1), unless the authorization is terminated or revoked sooner.  Performed at [facility], [address], [place], [place] [address]          Radiology Studies: No results found.      Scheduled Meds:   buPROPion  150 mg Oral Daily   docusate sodium  100 mg Oral BID   enoxaparin (LOVENOX) injection  30 mg Subcutaneous Q24H   ferrous sulfate  325 mg Oral Q breakfast   metoprolol tartrate  25 mg Oral BID   senna  1 tablet Oral BID   Continuous Infusions:  methocarbamol (ROBAXIN) IV      Assessment & Plan:   Principal Problem:   Fracture of femoral neck (HCC) Active Problems:   Essential hypertension   Paroxysmal atrial fibrillation (HCC)   Severe recurrent major depression without psychotic features (HCC)   Type 2 diabetes mellitus with hyperlipidemia (HCC)   Dehydration  Fracture of femoral neck (HCC) Status post mechanical fall with displaced fracture of right femoral neck. Rule out pathologic fracture since patient has a history of prostate cancer 6/11Status post right hip arthroplasty  6/13 PT rec.  SNF...will need psych to clear him as he is currently on 1:1 sit Bowel regimen Pain control     Essential hypertension Stable continue beta-blockers   Paroxysmal atrial fibrillation (HCC) Currently in sinus rhythm Not a candidate for anticoagulation due to history of spontaneous subdural hematoma Hold aspirin for planned procedure 6/13 aspirin held for planned surgery, will ask Ortho if cleared to resume Continue beta-blockers      Severe recurrent major depression without psychotic features [facility]) Patient admitted to [facility] for management of his depression and was receiving ECT therapy. Continue bupropion 6/13 psychiatrist input was appreciated-he may not be a candidate to return to the geriatric inpatient psychiatry floor due to his medical needs and rehabilitation.  He had been getting ECT prior to fall.  They will follow-up today for further evaluation.  They will need to clear patient for SNF    Dehydration Hydrated with IVF , and now will d/c   Type 2 diabetes mellitus with hyperlipidemia ([place]) Continue consistent carbohydrate diet and check blood  sugars with meals. Patient's last hemoglobin A1c was less than 6 6/13 BG variable. Place on  RISS           DVT prophylaxis: Lovenox Code Status: Full Family Communication: updated wife , all questions answered. Please call her daily for uptdates Disposition Plan:  Status is: Inpatient Remains inpatient appropriate because: IV treatment. On 1:1 sit, needs psychiatry clearance. SNF pending.       LOS: 3 days   Time spent: 55 minutes    Nolberto Hanlon, MD [facility] Pager [phone]  If 7PM-7AM, please contact night-coverage 04/09/2022, 12:27 PM

## 2022-04-09 NOTE — Progress Notes (Signed)
Subjective: 2 Days Post-Op Procedure(s) (LRB): ARTHROPLASTY BIPOLAR HIP (HEMIARTHROPLASTY) (Right) Patient is out of bed in a chair and alert today.  He converses fairly normally.  Pain is minimal.  Hemoglobin 16.0.  Dressing is dry.  Hip is stable.  Patient reports pain as mild.  Objective:   VITALS:   Vitals:   04/09/22 0741 04/09/22 1132  BP: 128/86 122/76  Pulse: 72 89  Resp: 15 16  Temp: 98 F (36.7 C) 98.6 F (37 C)  SpO2: 98% 97%    Neurologically intact Incision: dressing C/D/I  LABS Recent Labs    04/07/22 1434 04/08/22 0440 04/09/22 0404  HGB 15.8 16.0 14.4  HCT 48.2 46.8 42.4  WBC 12.8* 10.0 9.4  PLT 149* 154 116*    Recent Labs    04/07/22 1434 04/08/22 0440 04/09/22 0404  NA  --  136 135  K  --  3.7 3.8  BUN  --  17 18  CREATININE 0.81 0.81 0.82  GLUCOSE  --  145* 123*    No results for input(s): "LABPT", "INR" in the last 72 hours.   Assessment/Plan: 2 Days Post-Op Procedure(s) (LRB): ARTHROPLASTY BIPOLAR HIP (HEMIARTHROPLASTY) (Right)   Advance diet Up with therapy Discharge to SNF or psych unit as appropriate  Enteric-coated aspirin 81 mg twice daily upon leaving the hospital setting  Partial weightbearing 50% right leg with walker  Return to office [facility] in 2 weeks for stitch removal and x-ray

## 2022-04-09 NOTE — Progress Notes (Signed)
The above named patient is recommended to go to Short Term Rehab for strengthening and gait training for balance.  It is expected that the Short Term Rehab stay will be less than 30 days.  The patient is expected to return home after Rehab.  ?

## 2022-04-10 ENCOUNTER — Encounter: Payer: Self-pay | Admitting: Internal Medicine

## 2022-04-10 DIAGNOSIS — I480 Paroxysmal atrial fibrillation: Secondary | ICD-10-CM

## 2022-04-10 DIAGNOSIS — E1169 Type 2 diabetes mellitus with other specified complication: Secondary | ICD-10-CM

## 2022-04-10 DIAGNOSIS — I10 Essential (primary) hypertension: Secondary | ICD-10-CM

## 2022-04-10 DIAGNOSIS — S72001A Fracture of unspecified part of neck of right femur, initial encounter for closed fracture: Secondary | ICD-10-CM

## 2022-04-10 DIAGNOSIS — F332 Major depressive disorder, recurrent severe without psychotic features: Secondary | ICD-10-CM | POA: Diagnosis not present

## 2022-04-10 DIAGNOSIS — E785 Hyperlipidemia, unspecified: Secondary | ICD-10-CM

## 2022-04-10 LAB — CBC
HCT: 40.5 % (ref 39.0–52.0)
Hemoglobin: 14.3 g/dL (ref 13.0–17.0)
MCH: 32.1 pg (ref 26.0–34.0)
MCHC: 35.3 g/dL (ref 30.0–36.0)
MCV: 90.8 fL (ref 80.0–100.0)
Platelets: 157 10*3/uL (ref 150–400)
RBC: 4.46 MIL/uL (ref 4.22–5.81)
RDW: 13 % (ref 11.5–15.5)
WBC: 8.7 10*3/uL (ref 4.0–10.5)
nRBC: 0 % (ref 0.0–0.2)

## 2022-04-10 LAB — GLUCOSE, CAPILLARY
Glucose-Capillary: 125 mg/dL — ABNORMAL HIGH (ref 70–99)
Glucose-Capillary: 121 mg/dL — ABNORMAL HIGH (ref 70–99)
Glucose-Capillary: 236 mg/dL — ABNORMAL HIGH (ref 70–99)
Glucose-Capillary: 230 mg/dL — ABNORMAL HIGH (ref 70–99)
Glucose-Capillary: 204 mg/dL — ABNORMAL HIGH (ref 70–99)

## 2022-04-10 LAB — BASIC METABOLIC PANEL
Anion gap: 7 (ref 5–15)
BUN: 19 mg/dL (ref 8–23)
CO2: 21 mmol/L — ABNORMAL LOW (ref 22–32)
Calcium: 8.6 mg/dL — ABNORMAL LOW (ref 8.9–10.3)
Chloride: 108 mmol/L (ref 98–111)
Creatinine, Ser: 0.65 mg/dL (ref 0.61–1.24)
GFR, Estimated: >60 mL/min (ref >60)
Glucose, Bld: 142 mg/dL — ABNORMAL HIGH (ref 70–99)
Potassium: 3.9 mmol/L (ref 3.5–5.1)
Sodium: 136 mmol/L (ref 135–145)

## 2022-04-10 MED ORDER — INSULIN GLARGINE-YFGN 100 UNIT/ML ~~LOC~~ SOLN
5.0000 [IU] | Freq: Two times a day (BID) | SUBCUTANEOUS | Status: DC
  Administered 2022-04-10 – 2022-04-11 (×2): 5 [IU] via SUBCUTANEOUS
  Filled 2022-04-10 (×3): qty 0.05

## 2022-04-10 NOTE — TOC Progression Note (Signed)
Transition of Care [facility]) - Progression Note    Patient Details  Name: Mattis Featherly MRN: [id number] Date of Birth: [birth year]  Transition of Care [facility]) CM/SW [place], RN Phone Number: 04/10/2022, 1:35 PM  Clinical Narrative:     PASSR number obtained [id number] E  expires 05/10/22  Expected Discharge Plan: [place] Barriers to Discharge: Continued Medical Work up  Expected Discharge Plan and Services Expected Discharge Plan: [place] In-house Referral: Clinical Social Work   Post Acute Care Choice:  (TBD) Living arrangements for the past 2 months: Single Family Home                                       Social Determinants of Health (SDOH) Interventions    Readmission Risk Interventions     No data to display

## 2022-04-10 NOTE — TOC Progression Note (Signed)
Transition of Care [facility]) - Progression Note    Patient Details  Name: John [name] MRN: [id number] Date of Birth: November 23, 1940  Transition of Care [facility]) CM/SW [place], RN Phone Number: 04/10/2022, 9:57 AM  Clinical Narrative:    Uploaded the clinical documentation to [place] MUST, PASSR stillpending   Expected Discharge Plan: [place] Barriers to Discharge: Continued Medical Work up  Expected Discharge Plan and Services Expected Discharge Plan: [place] In-house Referral: Clinical Social Work   Post Acute Care Choice:  (TBD) Living arrangements for the past 2 months: Single Family Home                                       Social Determinants of Health (SDOH) Interventions    Readmission Risk Interventions     No data to display

## 2022-04-10 NOTE — Progress Notes (Addendum)
Progress Note   Patient: John [name] NFA:[id number] DOB: [birth year] DOA: 04/06/2022     4 DOS: the patient was seen and examined on 04/10/2022   Brief hospital course: Taken from prior notes.   John [name] is a 81 y.o. male with medical history significant for paroxysmal A-fib, depression, diabetes mellitus, history of subdural hematoma, hypertension who was admitted to the [facility] psych unit for management of depression for which he has been getting ECT. Patient states that he got up to use the bathroom last night and fell.  He denies feeling dizzy or lightheaded and states that he thinks he tripped and fell.  He got back in bed and this fall was unwitnessed.  This morning he started complaining of pain in the right hip and groin area which he rated a 7 x 10 in intensity at its worst.  He has been unable to bear weight on his right leg and was moved from his room in a recliner. X-ray showed displaced fracture in the neck of right femur. Patient was taken to the OR for right hip arthroplasty.   6/12 status post right hip arthroplasty by Dr. Sabra Heck on 6/11 6/13 PT rec. SNF. Will need clearance from psychiatry for discharge to snf. Currently with sitter.  6/14: Patient remained stable.  Denies any suicidal ideations.  Continued to have some depression.  No sign of any agitation.  No sitter required. Will appreciate psych help so we can discharge him to SNF for rehab. Dr. Weber Cooks will see and placed his recommendations today.  Psych might take him back tomorrow to geropsych bed for completion of depression treatment. Assessment and Plan: * Fracture of femoral neck (HCC) Status post mechanical fall with displaced fracture of right femoral neck. Rule out pathologic fracture since patient has a history of prostate cancer Immobilize right lower extremity S/p right hip arthroplasty by orthopedic surgery on 6/11, PT is recommending SNF. -Continue with pain management. -Continue with supportive  care  Severe recurrent major depression without psychotic features [facility]) Patient admitted to the [facility] psych unit for management of his depression and was receiving ECT therapy.  Denies any suicidal thoughts. -Continue bupropion -Consults psychiatry-will appreciate their recommendations -No need for sitter  Paroxysmal atrial fibrillation (HCC) Currently in sinus rhythm Continue metoprolol for rate control Not a candidate for anticoagulation due to history of spontaneous subdural hematoma -Can restart aspirin  Essential hypertension Blood pressure within goal. -Continue metoprolol  Type 2 diabetes mellitus with hyperlipidemia (HCC) CBG elevated above 200.  A1c of 5.6 on 03/19/2022. -Add Semglee 5 units twice daily -Continue with SSI -Continue to monitor  Dehydration Patient received normal saline on admission which has been not discontinued. -Encourage p.o. hydration   Subjective: Patient was seen and examined today.  Having 4/10 hip pain.  Sitting in chair.  Denies any suicidal thoughts.  Still feel little depressed.  Physical Exam: Vitals:   04/10/22 0401 04/10/22 0740 04/10/22 1128 04/10/22 1608  BP: (!) 140/98 (!) 154/96 128/85 120/82  Pulse: 70 73 77 75  Resp: '16 18 18 16  '$ Temp: 97.6 F (36.4 C) 98 F (36.7 C) 97.8 F (36.6 C) 98.2 F (36.8 C)  TempSrc:    Oral  SpO2: 98% 100% 99% 97%   General.  Well-developed gentleman, in no acute distress. Pulmonary.  Lungs clear bilaterally, normal respiratory effort. CV.  Regular rate and rhythm, no JVD, rub or murmur. Abdomen.  Soft, nontender, nondistended, BS positive. CNS.  Alert and oriented .  No focal  neurologic deficit. Extremities.  No edema, no cyanosis, pulses intact and symmetrical. Psychiatry.  Judgment and insight appears normal.  Data Reviewed: Prior notes, labs and images reviewed  Family Communication: Discussed with patient  Disposition: Status is: Inpatient Remains inpatient appropriate because:  Severity of illness   Planned Discharge Destination: Skilled nursing facility  DVT prophylaxis.  Lovenox Time spent: 45 minutes  This record has been created using Systems analyst. Errors have been sought and corrected,but may not always be located. Such creation errors do not reflect on the standard of care.  Author: Lorella Nimrod, MD 04/10/2022 4:30 PM  For on call review [email or web address].

## 2022-04-10 NOTE — Progress Notes (Signed)
Occupational Therapy Treatment Patient Details Name: John [name] MRN: [id number] DOB: 1940-12-15 Today's Date: 04/10/2022   History of present illness Pt is a 81 yo male with PMH of depression, anxiety, HTN, DM, OSA, prostate cancer, Horner syndrome, dissection of carotid artery, subdural hematoma, epidural hematoma. Previously hospitalized from 6/5-6/8 for severe depression and found to have abnormal EKG with A-fib. Pt receiving ECT for management of depression. Pt admitted 6/10 due to R femoral neck fx due to a fall in the  bathroom on the evening of 6/9. Pt underwent R hip hemiarthroplasty on 6/11.   OT comments  John [name] demonstrated good progress today, with less pain and greater strength and ROM in R hip, as well as increased alertness and conversational ability, compared to yesterday's session. He was able to complete bed mobility with SBA, perform sit<>stand transfers with Min A, and step-pivot transfers to [facility] and recliner with SBA. Pt independently recalled and described all WB restrictions and posterior hip precautions. He needed initial verbal cueing for hand placement on RW during transfers but was subsequently able to remember and follow these instructions. He ambulated to/from bathroom with stand-by assist and RW, expressing fatigue and demonstrating mild leg tremors by end of task. He endorsed 4/10 pain in R LE, concentrated primarily in lateral aspect of upper thigh, and denied any increased pain with movement or ambulation. 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 His overall affect, however, remains flat and he endorses that the mental health concerns that initially brought him to [facility] remain. John [name] continues to demonstrate rehabilitation needs related to R hip fx, as well as psychiatric needs; therefore, this therapist recommends DC to a geri-psych unit where pt can continue therapy to address both of these needs synchronously.   Recommendations for follow up therapy are one component of a multi-disciplinary discharge planning process, led by the attending physician.  Recommendations may be updated based on patient status, additional functional criteria and insurance authorization.    Follow Up Recommendations  Other (comment) (ongoing OT/PT within geri-psych unit)    Assistance Recommended at Discharge    Patient can return home with the following  A little help with walking and/or transfers;A little help with bathing/dressing/bathroom;Assistance with cooking/housework;Assist for transportation;Help with stairs or ramp for entrance   Equipment Recommendations  Other (comment) (RW)    Recommendations for Other Services Other (comment) (resumption of geri-pysch services)    Precautions / Restrictions Precautions Precautions: Posterior Hip;Fall Other Brace: Hip ABDuction pillow in supine Restrictions Weight Bearing Restrictions: Yes RLE Weight Bearing: Partial weight bearing RLE Partial Weight Bearing Percentage or Pounds: 50       Mobility Bed Mobility Overal bed mobility: Needs Assistance Bed Mobility: Supine to Sit     Supine to sit: Min guard     General bed mobility comments: Pt able to transfer to EOB sitting without physical assistance from therapist. Uses AROM and AAROM for lateral movement of RLE  Transfers Overall transfer level: Needs assistance Equipment used: Rolling walker (2 wheels) Transfers: Sit to/from Stand, Bed to  chair/wheelchair/BSC Sit to Stand: Min assist     Step pivot transfers: Min guard     General transfer comment: cues for hand placement, safe use of RW     Balance Overall balance assessment: Needs assistance Sitting-balance support: Feet supported, No upper extremity supported Sitting balance-Leahy Scale: Good     Standing balance support: Bilateral upper extremity supported, Reliant on assistive device for balance Standing balance-Leahy Scale: Fair Standing balance comment: Pt able to maintain PWB precaution with UE support on RW. Good balance initially during fxl mobility, but pt tiring quickly with ambulation.                           ADL either performed or assessed with clinical judgement   ADL                                              Extremity/Trunk Assessment Upper Extremity Assessment Upper Extremity Assessment: Overall WFL for tasks assessed   Lower Extremity Assessment Lower Extremity Assessment: RLE deficits/detail RLE Deficits / Details: improved active movement and decreased pain in R hip & thigh today compared to yesterday's session RLE Sensation: WNL   Cervical / Trunk Assessment Cervical / Trunk Assessment: Normal    Vision       Perception     Praxis      Cognition Arousal/Alertness: Awake/alert Behavior During Therapy: WFL for tasks assessed/performed Overall Cognitive Status: Within Functional Limits for tasks assessed                                 General Comments: more alert and conversational than yesterday, although still expressing feeling a bit "confused" and "not knowing what to do"        Exercises Other Exercises Other Exercises: Educ re: posterior hip precautions, WBing status, safe use of RW, importance of engagement in meaningful occupations    Shoulder Instructions       General Comments Pt continues to endorse depression and low mood.    Pertinent Vitals/ Pain        Pain Assessment Pain Assessment: 0-10 Pain Score: 4  Pain Location: R hip Pain Descriptors / Indicators: Operative site guarding, Discomfort, Grimacing Pain Intervention(s): Limited activity within patient's tolerance, Repositioned, Monitored during session  Home Living                                          Prior Functioning/Environment              Frequency  Min 2X/week        Progress Toward Goals  OT Goals(current goals can now be found in the care plan section)  Progress towards OT goals: Progressing toward goals  Acute Rehab OT Goals OT Goal Formulation: With patient Time For Goal Achievement: 04/23/22 Potential to Achieve Goals: Good  Plan      Co-evaluation                 AM-PAC OT "6 Clicks" Daily Activity     Outcome Measure   Help from  another person eating meals?: None Help from another person taking care of personal grooming?: A Little Help from another person toileting, which includes using toliet, bedpan, or urinal?: A Lot Help from another person bathing (including washing, rinsing, drying)?: A Lot Help from another person to put on and taking off regular upper body clothing?: A Little Help from another person to put on and taking off regular lower body clothing?: A Lot 6 Click Score: 16    End of Session Equipment Utilized During Treatment: Rolling walker (2 wheels)  OT Visit Diagnosis: Unsteadiness on feet (R26.81);Muscle weakness (generalized) (M62.81);Pain Pain - Right/Left: Right Pain - part of body: Hip   Activity Tolerance Patient tolerated treatment well   Patient Left in chair;with nursing/sitter in room   Nurse Communication          Time: 9417-4081 OT Time Calculation (min): 33 min  Charges: OT General Charges $OT Visit: 1 Visit OT Treatments $Self Care/Home Management : 23-37 mins  Josiah Lobo, PhD, MS, OTR/L 04/10/22, 10:40 AM

## 2022-04-10 NOTE — Plan of Care (Signed)

## 2022-04-10 NOTE — TOC Progression Note (Signed)
Transition of Care [facility]) - Progression Note    Patient Details  Name: John [name] MRN: [id number] Date of Birth: 01-11-1941  Transition of Care [facility]) CM/SW [place], RN Phone Number: 04/10/2022, 10:42 AM  Clinical Narrative:     Annia Friendly obtained, [phone]  Expected Discharge Plan: [place] Barriers to Discharge: Continued Medical Work up  Expected Discharge Plan and Services Expected Discharge Plan: [place] In-house Referral: Clinical Social Work   Post Acute Care Choice:  (TBD) Living arrangements for the past 2 months: Single Family Home                                       Social Determinants of Health (SDOH) Interventions    Readmission Risk Interventions     No data to display

## 2022-04-10 NOTE — Progress Notes (Signed)
Physical Therapy Treatment Patient Details Name: John [name] MRN: [id number] DOB: 1941-08-30 Today's Date: 04/10/2022   History of Present Illness Pt is a 81 yo male with PMH of depression, anxiety, HTN, DM, OSA, prostate cancer, Horner syndrome, dissection of carotid artery, subdural hematoma, epidural hematoma. Previously hospitalized from 6/5-6/8 for severe depression and found to have abnormal EKG with A-fib. Pt receiving ECT for management of depression. Pt admitted 6/10 due to R femoral neck fx due to a fall in the  bathroom on the evening of 6/9. Pt underwent R hip hemiarthroplasty on 6/11.    PT Comments    Pt is making good progress towards goals with ability to increase ambulation distance this session with improved cadence and reciprocal gait pattern. Able to maintain WBing status, however still only able to recall 1/3 hip precautions reliably. Still requires heavy cues for sequencing for hand placement during transfers. Very pleasant and eager to participate. Currently recommend further PT in next venue of care. SNF vs geripsych depending on MD clearance. Will continue to progress.   Recommendations for follow up therapy are one component of a multi-disciplinary discharge planning process, led by the attending physician.  Recommendations may be updated based on patient status, additional functional criteria and insurance authorization.  Follow Up Recommendations  Follow physician's recommendations for discharge plan and follow up therapies     Assistance Recommended at Discharge Frequent or constant Supervision/Assistance  Patient can return home with the following A little help with walking and/or transfers;A little help with bathing/dressing/bathroom   Equipment Recommendations  Rolling walker (2 wheels);BSC/3in1    Recommendations for Other Services       Precautions / Restrictions Precautions Precautions: Posterior Hip;Fall Precaution Booklet Issued: Yes (comment) Other  Brace: Hip ABDuction pillow in supine Restrictions Weight Bearing Restrictions: Yes RLE Weight Bearing: Weight bearing as tolerated RLE Partial Weight Bearing Percentage or Pounds: 50     Mobility  Bed Mobility               General bed mobility comments: not performed as received up in recliner    Transfers Overall transfer level: Needs assistance Equipment used: Rolling walker (2 wheels) Transfers: Sit to/from Stand, Bed to chair/wheelchair/BSC Sit to Stand: Min assist           General transfer comment: Still requires cues for sequencing including maintaining hip precautions with mobility and hand placement.    Ambulation/Gait Ambulation/Gait assistance: Min guard Gait Distance (Feet): 110 Feet Assistive device: Rolling walker (2 wheels) Gait Pattern/deviations: Step-through pattern       General Gait Details: improved step length and reciprocal gait pattern with cues for RW guidance. Needs heavy cues for R turns and cues for WBing status. Reports increased fatigue this date   Stairs             Wheelchair Mobility    Modified Rankin (Stroke Patients Only)       Balance Overall balance assessment: Needs assistance Sitting-balance support: Feet supported, No upper extremity supported Sitting balance-Leahy Scale: Good     Standing balance support: Bilateral upper extremity supported, Reliant on assistive device for balance Standing balance-Leahy Scale: Fair                              Cognition Arousal/Alertness: Awake/alert Behavior During Therapy: WFL for tasks assessed/performed Overall Cognitive Status: Within Functional Limits for tasks assessed  General Comments: remains slightly confused and poor retention of precautions        Exercises Other Exercises Other Exercises: Seated ther-ex performed including LAQ x 12 reps with cga    General Comments General comments (skin  integrity, edema, etc.): Pt continues to endorse depression and low mood.      Pertinent Vitals/Pain Pain Assessment Pain Assessment: Faces Faces Pain Scale: Hurts a little bit Pain Location: R hip Pain Descriptors / Indicators: Operative site guarding, Discomfort, Grimacing Pain Intervention(s): Limited activity within patient's tolerance    Home Living                          Prior Function            PT Goals (current goals can now be found in the care plan section) Acute Rehab PT Goals Patient Stated Goal: to go home PT Goal Formulation: With patient Time For Goal Achievement: 04/22/22 Potential to Achieve Goals: Good Progress towards PT goals: Progressing toward goals    Frequency    BID      PT Plan Discharge plan needs to be updated    Co-evaluation              AM-PAC PT "6 Clicks" Mobility   Outcome Measure  Help needed turning from your back to your side while in a flat bed without using bedrails?: A Little Help needed moving from lying on your back to sitting on the side of a flat bed without using bedrails?: A Little Help needed moving to and from a bed to a chair (including a wheelchair)?: A Little Help needed standing up from a chair using your arms (e.g., wheelchair or bedside chair)?: A Little Help needed to walk in hospital room?: A Little Help needed climbing 3-5 steps with a railing? : A Lot 6 Click Score: 17    End of Session Equipment Utilized During Treatment: Gait belt Activity Tolerance: Patient limited by fatigue Patient left: in chair;with nursing/sitter in room Nurse Communication: Mobility status PT Visit Diagnosis: Muscle weakness (generalized) (M62.81);History of falling (Z91.81);Pain Pain - Right/Left: Right Pain - part of body: Hip     Time: 4081-4481 PT Time Calculation (min) (ACUTE ONLY): 29 min  Charges:  $Gait Training: 8-22 mins $Therapeutic Exercise: 8-22 mins                     [name],  PT, DPT, [facility] [phone]    Bhavana Kady 04/10/2022, 1:35 PM

## 2022-04-10 NOTE — Hospital Course (Addendum)
Taken from prior notes.   John [name] is a 81 y.o. male with medical history significant for paroxysmal A-fib, depression, diabetes mellitus, history of subdural hematoma, hypertension who was admitted to the [facility] psych unit for management of depression for which he has been getting ECT. Patient states that he got up to use the bathroom last night and fell.  He denies feeling dizzy or lightheaded and states that he thinks he tripped and fell.  He got back in bed and this fall was unwitnessed.  This morning he started complaining of pain in the right hip and groin area which he rated a 7 x 10 in intensity at its worst.  He has been unable to bear weight on his right leg and was moved from his room in a recliner. X-ray showed displaced fracture in the neck of right femur. Patient was taken to the OR for right hip arthroplasty.   6/12 status post right hip arthroplasty by Dr. Sabra Heck on 6/11 6/13 PT rec. SNF. Will need clearance from psychiatry for discharge to snf. Currently with sitter.  6/14: Patient remained stable.  Denies any suicidal ideations.  Continued to have some depression.  No sign of any agitation.  No sitter required. Will appreciate psych help so we can discharge him to SNF for rehab. Dr. Weber Cooks will see and placed his recommendations today.  6/15: Patient remained medically stable.  Behavioral health is going to readmit him to complete ECT. Patient is being discharged back to behavioral health.  He was placed on baby aspirin by orthopedics for DVT prophylaxis.  He was also given Norco along with bowel regimen to be used as needed for pain and constipation. Atenolol was discontinued and he will continue with metoprolol. Patient need to have a follow-up with orthopedic surgery in 2 weeks  Patient will continue with current medications and follow-up with his providers.

## 2022-04-10 NOTE — Plan of Care (Signed)

## 2022-04-10 NOTE — Progress Notes (Signed)
Physical Therapy Treatment Patient Details Name: John [name] MRN: [id number] DOB: 1940-10-29 Today's Date: 04/10/2022   History of Present Illness Pt is a 81 yo male with PMH of depression, anxiety, HTN, DM, OSA, prostate cancer, Horner syndrome, dissection of carotid artery, subdural hematoma, epidural hematoma. Previously hospitalized from 6/5-6/8 for severe depression and found to have abnormal EKG with A-fib. Pt receiving ECT for management of depression. Pt admitted 6/10 due to R femoral neck fx due to a fall in the  bathroom on the evening of 6/9. Pt underwent R hip hemiarthroplasty on 6/11.    PT Comments    Pt progressing toward PT goals. Pt knew and was able to recite most of his R hip precautions except for adduction at start of PM session. Pt had been seated in the recliner since AM session and was ready to get back into bed. Pt completed step-pivot transfer with verbal cues for proper technique for transfers and using RW with hip precautions. Pt required min A for R LE management sit to supine, but was able to pull himself toward the [facility] using bed rail once in supine. Pt completed there-ex in supine with PT verbal cues and utilized HEP booklet images and instructions with pt actively engaged in exercises. Currently recommend further PT in next venue of care. SNF vs geripsych depending on MD clearance.   Recommendations for follow up therapy are one component of a multi-disciplinary discharge planning process, led by the attending physician.  Recommendations may be updated based on patient status, additional functional criteria and insurance authorization.  Follow Up Recommendations  Follow physician's recommendations for discharge plan and follow up therapies     Assistance Recommended at Discharge Frequent or constant Supervision/Assistance  Patient can return home with the following A little help with walking and/or transfers;A little help with bathing/dressing/bathroom   Equipment  Recommendations  Rolling walker (2 wheels);BSC/3in1    Recommendations for Other Services       Precautions / Restrictions Precautions Precautions: Posterior Hip;Fall Precaution Booklet Issued: Yes (comment) Restrictions Weight Bearing Restrictions: Yes RLE Weight Bearing: Weight bearing as tolerated RLE Partial Weight Bearing Percentage or Pounds: 50     Mobility  Bed Mobility Overal bed mobility: Needs Assistance Bed Mobility: Sit to Supine       Sit to supine: Min assist   General bed mobility comments: Min A for R LE management during sit to supine. Pt able to pull himself up toward [facility] using bed rail    Transfers Overall transfer level: Needs assistance Equipment used: Rolling walker (2 wheels) Transfers: Sit to/from Stand, Bed to chair/wheelchair/BSC Sit to Stand: Min assist   Step pivot transfers: Min guard       General transfer comment: Continues to require verbal cues for UE/LE placement during sit-to-stand and proper step/amb technique with RW    Ambulation/Gait         Gait velocity: decreased.     General Gait Details: Completed step pivot transfer from recliner to bed with CGA and verbal cues for proper technique with RW   Stairs             Wheelchair Mobility    Modified Rankin (Stroke Patients Only)       Balance Overall balance assessment: Needs assistance Sitting-balance support: Feet supported, No upper extremity supported Sitting balance-Leahy Scale: Good Sitting balance - Comments: Sitting in recliner at start of session with back unsupported, no UE support, and feet on floor. Able to reach for RW without  LOB.   Standing balance support: Reliant on assistive device for balance, Bilateral upper extremity supported Standing balance-Leahy Scale: Fair Standing balance comment: Pt able to maintain PWB precautions bilat UEs on RW                            Cognition Arousal/Alertness: Awake/alert Behavior  During Therapy: WFL for tasks assessed/performed Overall Cognitive Status: Within Functional Limits for tasks assessed                                 General Comments: Pt able to recall all precautions except for no adduction past midline without PT assist at start of session        Exercises Total Joint Exercises Ankle Circles/Pumps: AROM, Both, 10 reps, Supine Quad Sets: AROM, Strengthening, Right, 10 reps, Supine Gluteal Sets: AROM, Both, 10 reps, Supine Short Arc Quad: AROM, Strengthening, Right, 10 reps, Supine Straight Leg Raises: AROM, Strengthening, Right, 10 reps, Supine Long Arc Quad: AROM, Strengthening, Right, 10 reps, Seated    General Comments        Pertinent Vitals/Pain Pain Assessment Pain Assessment: 0-10 Pain Score: 4  (Pt reported 2/10 when seated and 4/10 while completing ther-ex of LAQs) Pain Location: R hip Pain Descriptors / Indicators: Operative site guarding, Discomfort Pain Intervention(s): Limited activity within patient's tolerance, Monitored during session, Repositioned    Home Living                          Prior Function            PT Goals (current goals can now be found in the care plan section) Acute Rehab PT Goals Patient Stated Goal: to go home PT Goal Formulation: With patient Time For Goal Achievement: 04/22/22 Potential to Achieve Goals: Good Progress towards PT goals: Progressing toward goals    Frequency    BID      PT Plan Current plan remains appropriate    Co-evaluation              AM-PAC PT "6 Clicks" Mobility   Outcome Measure  Help needed turning from your back to your side while in a flat bed without using bedrails?: A Little Help needed moving from lying on your back to sitting on the side of a flat bed without using bedrails?: A Little Help needed moving to and from a bed to a chair (including a wheelchair)?: A Little Help needed standing up from a chair using your arms  (e.g., wheelchair or bedside chair)?: A Little Help needed to walk in hospital room?: A Little Help needed climbing 3-5 steps with a railing? : A Lot 6 Click Score: 17    End of Session Equipment Utilized During Treatment: Gait belt Activity Tolerance: Patient tolerated treatment well Patient left: in bed;with call bell/phone within reach;with bed alarm set Nurse Communication: Mobility status PT Visit Diagnosis: Muscle weakness (generalized) (M62.81);History of falling (Z91.81);Pain Pain - Right/Left: Right Pain - part of body: Hip     Time: [id number] PT Time Calculation (min) (ACUTE ONLY): 23 min  Charges:                      Rella Larve, SPT  Lola Czerwonka 04/10/2022, 4:04 PM

## 2022-04-10 NOTE — Consult Note (Signed)
[facility] Consult   Reason for Consult: Follow-up consult patient with depression who was transferred upstairs because of a broken hip Referring Physician:  Reesa Chew Patient Identification: John [name] MRN:  [id number] Principal Diagnosis: Fracture of femoral neck ([place]) Diagnosis:  Principal Problem:   Fracture of femoral neck ([place]) Active Problems:   Essential hypertension   Paroxysmal atrial fibrillation (HCC)   Type 2 diabetes mellitus with hyperlipidemia ([place])   Severe recurrent major depression without psychotic features ([place])   Dehydration   Total Time spent with patient: 30 minutes  [name] is a 81 y.o. male patient admitted with "I am struggling with my mood but the surgery went fine".  HPI: Patient has been upstairs on the medical service surgical service after breaking his hip and having a repair.  As far as the surgery he is starting to be able to ambulate with a walker and says he is trying to work hard on doing the physical therapy.  His mood remains depressed.  No suicidal intent or plan but has negativistic suicidal like thoughts and hopelessness.  Looks sad about him.  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 06/2015 CT Head: abnl thickening of mid-dist R cervical ICA w/o narrowing - ? vasculitis and thrombosed/healing dissection; b. 01/2021 Carotid U/S: 1-39% bilat ICA stenoses.   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 Age 34 - struck in head w/ baseball --> s/p craniotomy.   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 Approx 2015 - eval in [place] - conservatively managed.    Past Surgical History:  Procedure Laterality Date   ANKLE ARTHROSCOPY Left    fracture   CHOLECYSTECTOMY  2007   [place]   left frontal craniotomy for epidural hematoma   HIP ARTHROPLASTY Right 04/07/2022   Procedure: ARTHROPLASTY BIPOLAR HIP (HEMIARTHROPLASTY);  Surgeon: Earnestine Leys, MD;  Location: [facility] ORS;  Service: Orthopedics;  Laterality: Right;   INSERTION OF MESH  01/02/2022   Procedure: INSERTION OF MESH;  Surgeon: Herbert Pun, MD;  Location: [facility] ORS;  Service: General;;   LIGAMENT REPAIR Right    thumb   RETINAL DETACHMENT SURGERY     TREATMENT FISTULA ANAL  2006   Family History:  Family History  Problem Relation Age of Onset   Osteoporosis Mother    Depression Mother    Prostate cancer Father    Hypertension Father    Diabetes Brother    Kidney disease Neg Hx    Family Psychiatric  History: See previous Social History:  Social History  Substance and Sexual Activity  Alcohol Use Not Currently     Social History   Substance and Sexual Activity  Drug Use No    Social History   Socioeconomic History   Marital status: Married    Spouse name: [name]   Number of children: 2   Years of education: Not on file   Highest education level: Not on file  Occupational History   Not on file  Tobacco Use   Smoking status: Never   Smokeless tobacco: Never  Vaping Use   Vaping Use: Never used  Substance and Sexual Activity   Alcohol use: Not Currently   Drug use: No   Sexual activity: Yes    Birth control/protection: None  Other Topics Concern   Not on file  Social History Narrative    Live at home with wife.    Social Determinants of Health   Financial Resource Strain: Not on file  Food Insecurity: Not on file  Transportation Needs: Not on file  Physical Activity: Not on file  Stress: Not on file  Social Connections: Not on file   Additional Social History:    Allergies:  No Known Allergies  Labs:  Results for orders placed or performed during the hospital encounter of 04/06/22 (from the past 48 hour(s))  Glucose, capillary     Status: Abnormal   Collection Time: 04/08/22 10:12 PM  Result Value Ref Range   Glucose-Capillary 161 (H) 70 - 99 mg/dL    Comment: Glucose reference range applies only to samples taken after fasting for at least 8 hours.   Comment 1 Notify RN   CBC     Status: Abnormal   Collection Time: 04/09/22  4:04 AM  Result Value Ref Range   WBC 9.4 4.0 - 10.5 K/uL   RBC 4.55 4.22 - 5.81 MIL/uL   Hemoglobin 14.4 13.0 - 17.0 g/dL   HCT 42.4 39.0 - 52.0 %   MCV 93.2 80.0 - 100.0 fL   MCH 31.6 26.0 - 34.0 pg   MCHC 34.0 30.0 - 36.0 g/dL   RDW 13.2 11.5 - 15.5 %   Platelets 116 (L) 150 - 400 K/uL    Comment: REPEATED TO VERIFY   nRBC 0.0 0.0 - 0.2 %    Comment: Performed at [facility], [address], [place], [place] [address]  Basic metabolic panel     Status: Abnormal   Collection Time: 04/09/22  4:04 AM  Result Value Ref Range   Sodium 135 135 - 145 mmol/L   Potassium 3.8 3.5 - 5.1 mmol/L   Chloride 107 98 - 111 mmol/L   CO2 24 22 - 32 mmol/L   Glucose, Bld 123 (H) 70 - 99 mg/dL    Comment: Glucose reference range applies only to samples taken after fasting for at least 8 hours.   BUN 18 8 - 23 mg/dL   Creatinine, Ser 0.82 0.61 - 1.24 mg/dL   Calcium 8.2 (L) 8.9 - 10.3 mg/dL   GFR, Estimated >60 >60 mL/min    Comment: (NOTE) Calculated using the CKD-EPI Creatinine Equation (2021)    Anion gap 4 (L) 5 - 15    Comment: Performed at [facility], [address] [place], [place] [address]  Glucose, capillary      Status: Abnormal   Collection Time: 04/09/22  8:09 AM  Result Value Ref Range   Glucose-Capillary 139 (H) 70 - 99 mg/dL    Comment: Glucose reference range applies only to  samples taken after fasting for at least 8 hours.  Glucose, capillary     Status: Abnormal   Collection Time: 04/09/22 11:16 AM  Result Value Ref Range   Glucose-Capillary 210 (H) 70 - 99 mg/dL    Comment: Glucose reference range applies only to samples taken after fasting for at least 8 hours.  Glucose, capillary     Status: Abnormal   Collection Time: 04/09/22  5:01 PM  Result Value Ref Range   Glucose-Capillary 161 (H) 70 - 99 mg/dL    Comment: Glucose reference range applies only to samples taken after fasting for at least 8 hours.  Glucose, capillary     Status: Abnormal   Collection Time: 04/09/22  8:27 PM  Result Value Ref Range   Glucose-Capillary 184 (H) 70 - 99 mg/dL    Comment: Glucose reference range applies only to samples taken after fasting for at least 8 hours.  Glucose, capillary     Status: Abnormal   Collection Time: 04/10/22  4:18 AM  Result Value Ref Range   Glucose-Capillary 125 (H) 70 - 99 mg/dL    Comment: Glucose reference range applies only to samples taken after fasting for at least 8 hours.  CBC     Status: None   Collection Time: 04/10/22  4:51 AM  Result Value Ref Range   WBC 8.7 4.0 - 10.5 K/uL   RBC 4.46 4.22 - 5.81 MIL/uL   Hemoglobin 14.3 13.0 - 17.0 g/dL   HCT 40.5 39.0 - 52.0 %   MCV 90.8 80.0 - 100.0 fL   MCH 32.1 26.0 - 34.0 pg   MCHC 35.3 30.0 - 36.0 g/dL   RDW 13.0 11.5 - 15.5 %   Platelets 157 150 - 400 K/uL   nRBC 0.0 0.0 - 0.2 %    Comment: Performed at [facility], [address] [place], [place] [address]  Basic metabolic panel     Status: Abnormal   Collection Time: 04/10/22  4:51 AM  Result Value Ref Range   Sodium 136 135 - 145 mmol/L   Potassium 3.9 3.5 - 5.1 mmol/L   Chloride 108 98 - 111 mmol/L   CO2 21 (L) 22 - 32 mmol/L   Glucose, Bld  142 (H) 70 - 99 mg/dL    Comment: Glucose reference range applies only to samples taken after fasting for at least 8 hours.   BUN 19 8 - 23 mg/dL   Creatinine, Ser 0.65 0.61 - 1.24 mg/dL   Calcium 8.6 (L) 8.9 - 10.3 mg/dL   GFR, Estimated >60 >60 mL/min    Comment: (NOTE) Calculated using the CKD-EPI Creatinine Equation (2021)    Anion gap 7 5 - 15    Comment: Performed at [facility], [address] [place], [place] [address]  Glucose, capillary     Status: Abnormal   Collection Time: 04/10/22  7:38 AM  Result Value Ref Range   Glucose-Capillary 121 (H) 70 - 99 mg/dL    Comment: Glucose reference range applies only to samples taken after fasting for at least 8 hours.  Glucose, capillary     Status: Abnormal   Collection Time: 04/10/22 11:30 AM  Result Value Ref Range   Glucose-Capillary 236 (H) 70 - 99 mg/dL    Comment: Glucose reference range applies only to samples taken after fasting for at least 8 hours.  Glucose, capillary     Status: Abnormal   Collection Time: 04/10/22  4:13 PM  Result Value  Ref Range   Glucose-Capillary 230 (H) 70 - 99 mg/dL    Comment: Glucose reference range applies only to samples taken after fasting for at least 8 hours.    Current Facility-Administered Medications  Medication Dose Route Frequency Provider Last Rate Last Admin   acetaminophen (TYLENOL) tablet 325-650 mg  325-650 mg Oral Q6H PRN Earnestine Leys, MD       alum & mag hydroxide-simeth (MAALOX/MYLANTA) 200-200-20 MG/5ML suspension 30 mL  30 mL Oral Q4H PRN Earnestine Leys, MD       aspirin EC tablet 81 mg  81 mg Oral Daily Nolberto Hanlon, MD   81 mg at 04/10/22 5462   bisacodyl (DULCOLAX) suppository 10 mg  10 mg Rectal Daily PRN Earnestine Leys, MD   10 mg at 04/09/22 0446   buPROPion (WELLBUTRIN XL) 24 hr tablet 150 mg  150 mg Oral Daily Agbata, Tochukwu, MD   150 mg at 04/10/22 0924   docusate sodium (COLACE) capsule 100 mg  100 mg Oral BID Earnestine Leys, MD   100 mg at 04/10/22  0924   enoxaparin (LOVENOX) injection 30 mg  30 mg Subcutaneous Q24H Earnestine Leys, MD   30 mg at 04/10/22 7035   ferrous sulfate tablet 325 mg  325 mg Oral Q breakfast Earnestine Leys, MD   325 mg at 04/10/22 0924   HYDROcodone-acetaminophen (NORCO/VICODIN) 5-325 MG per tablet 1-2 tablet  1-2 tablet Oral Q4H PRN Earnestine Leys, MD   1 tablet at 04/08/22 2254   insulin aspart (novoLOG) injection 0-9 Units  0-9 Units Subcutaneous TID WC Nolberto Hanlon, MD   3 Units at 04/10/22 1614   menthol-cetylpyridinium (CEPACOL) lozenge 3 mg  1 lozenge Oral PRN Earnestine Leys, MD       Or   phenol (CHLORASEPTIC) mouth spray 1 spray  1 spray Mouth/Throat PRN Earnestine Leys, MD       methocarbamol (ROBAXIN) tablet 500 mg  500 mg Oral Q6H PRN Agbata, Tochukwu, MD   500 mg at 04/07/22 2111   Or   methocarbamol (ROBAXIN) 500 mg in dextrose 5 % 50 mL IVPB  500 mg Intravenous Q6H PRN Agbata, Tochukwu, MD       metoCLOPramide (REGLAN) tablet 5-10 mg  5-10 mg Oral Q8H PRN Earnestine Leys, MD       Or   metoCLOPramide (REGLAN) injection 5-10 mg  5-10 mg Intravenous Q8H PRN Earnestine Leys, MD       metoprolol tartrate (LOPRESSOR) tablet 25 mg  25 mg Oral BID Agbata, Tochukwu, MD   25 mg at 04/10/22 0924   morphine (PF) 2 MG/ML injection 0.5-1 mg  0.5-1 mg Intravenous Q2H PRN Earnestine Leys, MD       ondansetron [facility]) tablet 4 mg  4 mg Oral Q6H PRN Earnestine Leys, MD       Or   ondansetron [facility]) injection 4 mg  4 mg Intravenous Q6H PRN Earnestine Leys, MD       polyethylene glycol (MIRALAX / GLYCOLAX) packet 17 g  17 g Oral Daily Nolberto Hanlon, MD   17 g at 04/10/22 0093   senna (SENOKOT) tablet 8.6 mg  1 tablet Oral BID Agbata, Tochukwu, MD   8.6 mg at 04/10/22 0924   sodium phosphate (FLEET) 7-19 GM/118ML enema 1 enema  1 enema Rectal Once PRN Earnestine Leys, MD       zolpidem Lorrin Mais) tablet 5 mg  5 mg Oral QHS PRN Earnestine Leys, MD   5 mg at 04/07/22 2213  Musculoskeletal: Strength & Muscle Tone: within  normal limits Gait & Station: unsteady Patient leans: N/A            Psychiatric Specialty Exam:  Presentation  General Appearance: Appropriate for Environment  Eye Contact:Good  Speech:Clear and Coherent  Speech Volume:Normal  Handedness:Right   Mood and Affect  Mood:Anxious (stated "a little anxious")  Affect:Congruent   Thought Process  Thought Processes:Linear; Coherent; Goal Directed  Descriptions of Associations:Intact  Orientation:Full (Time, Place and Person)  Thought Content:Logical; WDL  History of Schizophrenia/Schizoaffective disorder:No  Duration of Psychotic Symptoms:No data recorded Hallucinations:Hallucinations: None  Ideas of Reference:None  Suicidal Thoughts:Suicidal Thoughts: No  Homicidal Thoughts:Homicidal Thoughts: No   Sensorium  Memory:Immediate Good  Judgment:Good  Insight:Good   Executive Functions  Concentration:Good  Attention Span:Good  [place] of Knowledge:Good  Language:Good   Psychomotor Activity  Psychomotor Activity:Psychomotor Activity: Normal   Assets  Assets:Housing; Catering manager; Desire for Improvement; Communication Skills; Resilience; Social Support   Sleep  Sleep:Sleep: Good   Physical Exam: Physical Exam Vitals and nursing note reviewed.  Constitutional:      Appearance: Normal appearance.  HENT:     Head: Normocephalic and atraumatic.     Mouth/Throat:     Pharynx: Oropharynx is clear.  Eyes:     Pupils: Pupils are equal, round, and reactive to light.  Cardiovascular:     Rate and Rhythm: Normal rate and regular rhythm.  Pulmonary:     Effort: Pulmonary effort is normal.     Breath sounds: Normal breath sounds.  Abdominal:     General: Abdomen is flat.     Palpations: Abdomen is soft.  Musculoskeletal:        General: Normal range of motion.  Skin:    General: Skin is warm and dry.  Neurological:     General: No focal deficit present.      Mental Status: He is alert. Mental status is at baseline.  Psychiatric:        Attention and Perception: Attention normal.        Mood and Affect: Mood is depressed.        Speech: Speech is delayed.        Behavior: Behavior is slowed.        Thought Content: Thought content includes suicidal ideation. Thought content does not include suicidal plan.        Cognition and Memory: Cognition normal.    Review of Systems  Constitutional: Negative.   HENT: Negative.    Eyes: Negative.   Respiratory: Negative.    Cardiovascular: Negative.   Gastrointestinal: Negative.   Musculoskeletal: Negative.   Skin: Negative.   Neurological: Negative.   Psychiatric/Behavioral:  Positive for depression and suicidal ideas.    Blood pressure 120/82, pulse 75, temperature 98.2 F (36.8 C), temperature source Oral, resp. rate 16, SpO2 97 %. There is no height or weight on file to calculate BMI.  Treatment Plan Summary: Plan discussed the pros and cons of various options with the patient.  We decided that he can still get physical therapy on the geriatric ward but we could continue the ECT or try to find medicine that he will tolerate better.  Patient is agreeable to a plan for voluntary transfer to the geriatric unit and we will anticipate a likely transfer tomorrow hoping that a bed will be available.  Disposition:  See note above.  Readmit to geriatric service tomorrow.  [name], MD 04/10/2022 4:27 PM

## 2022-04-10 NOTE — Plan of Care (Signed)
  Problem: Pain Managment: Goal: General experience of comfort will improve Outcome: Progressing   

## 2022-04-10 NOTE — Progress Notes (Signed)
1254 Verbal order with readback from [place] d/c sitter 1:1

## 2022-04-11 ENCOUNTER — Inpatient Hospital Stay
Admission: AD | Admit: 2022-04-11 | Discharge: 2022-04-26 | DRG: 885 | Disposition: A | Discharge status: Home / Self Care | Payer: Medicare Other | Source: Intra-hospital | Attending: Psychiatry | Admitting: Psychiatry

## 2022-04-11 ENCOUNTER — Encounter: Payer: Self-pay | Admitting: Psychiatry

## 2022-04-11 DIAGNOSIS — F332 Major depressive disorder, recurrent severe without psychotic features: Secondary | ICD-10-CM | POA: Diagnosis present

## 2022-04-11 DIAGNOSIS — R350 Frequency of micturition: Secondary | ICD-10-CM | POA: Diagnosis present

## 2022-04-11 DIAGNOSIS — S72001A Fracture of unspecified part of neck of right femur, initial encounter for closed fracture: Secondary | ICD-10-CM | POA: Diagnosis not present

## 2022-04-11 DIAGNOSIS — S72141A Displaced intertrochanteric fracture of right femur, initial encounter for closed fracture: Secondary | ICD-10-CM

## 2022-04-11 DIAGNOSIS — N3941 Urge incontinence: Secondary | ICD-10-CM | POA: Diagnosis present

## 2022-04-11 DIAGNOSIS — I10 Essential (primary) hypertension: Secondary | ICD-10-CM | POA: Diagnosis present

## 2022-04-11 DIAGNOSIS — Z8679 Personal history of other diseases of the circulatory system: Secondary | ICD-10-CM

## 2022-04-11 DIAGNOSIS — G43909 Migraine, unspecified, not intractable, without status migrainosus: Secondary | ICD-10-CM | POA: Diagnosis present

## 2022-04-11 DIAGNOSIS — R41843 Psychomotor deficit: Secondary | ICD-10-CM | POA: Diagnosis present

## 2022-04-11 DIAGNOSIS — W1830XD Fall on same level, unspecified, subsequent encounter: Secondary | ICD-10-CM | POA: Diagnosis not present

## 2022-04-11 DIAGNOSIS — Z9049 Acquired absence of other specified parts of digestive tract: Secondary | ICD-10-CM

## 2022-04-11 DIAGNOSIS — Z79899 Other long term (current) drug therapy: Secondary | ICD-10-CM

## 2022-04-11 DIAGNOSIS — F419 Anxiety disorder, unspecified: Secondary | ICD-10-CM | POA: Diagnosis present

## 2022-04-11 DIAGNOSIS — Z9151 Personal history of suicidal behavior: Secondary | ICD-10-CM | POA: Diagnosis not present

## 2022-04-11 DIAGNOSIS — I480 Paroxysmal atrial fibrillation: Secondary | ICD-10-CM | POA: Diagnosis present

## 2022-04-11 DIAGNOSIS — G902 Horner's syndrome: Secondary | ICD-10-CM | POA: Diagnosis present

## 2022-04-11 DIAGNOSIS — E119 Type 2 diabetes mellitus without complications: Secondary | ICD-10-CM | POA: Diagnosis present

## 2022-04-11 DIAGNOSIS — Z8249 Family history of ischemic heart disease and other diseases of the circulatory system: Secondary | ICD-10-CM | POA: Diagnosis not present

## 2022-04-11 DIAGNOSIS — F418 Other specified anxiety disorders: Secondary | ICD-10-CM | POA: Diagnosis not present

## 2022-04-11 DIAGNOSIS — Z96641 Presence of right artificial hip joint: Secondary | ICD-10-CM | POA: Diagnosis present

## 2022-04-11 DIAGNOSIS — S72141D Displaced intertrochanteric fracture of right femur, subsequent encounter for closed fracture with routine healing: Secondary | ICD-10-CM

## 2022-04-11 DIAGNOSIS — H9193 Unspecified hearing loss, bilateral: Secondary | ICD-10-CM | POA: Diagnosis present

## 2022-04-11 DIAGNOSIS — Z8546 Personal history of malignant neoplasm of prostate: Secondary | ICD-10-CM | POA: Diagnosis not present

## 2022-04-11 DIAGNOSIS — Z7984 Long term (current) use of oral hypoglycemic drugs: Secondary | ICD-10-CM | POA: Diagnosis present

## 2022-04-11 DIAGNOSIS — G4700 Insomnia, unspecified: Secondary | ICD-10-CM | POA: Diagnosis present

## 2022-04-11 DIAGNOSIS — Z818 Family history of other mental and behavioral disorders: Secondary | ICD-10-CM | POA: Diagnosis not present

## 2022-04-11 DIAGNOSIS — Z8042 Family history of malignant neoplasm of prostate: Secondary | ICD-10-CM

## 2022-04-11 DIAGNOSIS — Z833 Family history of diabetes mellitus: Secondary | ICD-10-CM

## 2022-04-11 DIAGNOSIS — G4730 Sleep apnea, unspecified: Secondary | ICD-10-CM | POA: Diagnosis present

## 2022-04-11 DIAGNOSIS — Z7982 Long term (current) use of aspirin: Secondary | ICD-10-CM

## 2022-04-11 DIAGNOSIS — Z794 Long term (current) use of insulin: Secondary | ICD-10-CM

## 2022-04-11 LAB — GLUCOSE, CAPILLARY
Glucose-Capillary: 175 mg/dL — ABNORMAL HIGH (ref 70–99)
Glucose-Capillary: 137 mg/dL — ABNORMAL HIGH (ref 70–99)
Glucose-Capillary: 160 mg/dL — ABNORMAL HIGH (ref 70–99)
Glucose-Capillary: 120 mg/dL — ABNORMAL HIGH (ref 70–99)
Glucose-Capillary: 146 mg/dL — ABNORMAL HIGH (ref 70–99)

## 2022-04-11 MED ORDER — ALUM & MAG HYDROXIDE-SIMETH 200-200-20 MG/5ML PO SUSP: 30.0000 mL | ORAL | Status: DC | PRN

## 2022-04-11 MED ORDER — HYDROCODONE-ACETAMINOPHEN 5-325 MG PO TABS: 1.0000 | ORAL_TABLET | ORAL | 0 refills | Status: DC | PRN

## 2022-04-11 MED ORDER — POLYETHYLENE GLYCOL 3350 17 G PO PACK
17.0000 g | PACK | Freq: Every day | ORAL | Status: DC
  Administered 2022-04-12 – 2022-04-25 (×7): 17 g via ORAL
  Filled 2022-04-11 (×11): qty 1

## 2022-04-11 MED ORDER — DOCUSATE SODIUM 100 MG PO CAPS: 100.0000 mg | ORAL_CAPSULE | Freq: Two times a day (BID) | ORAL | 0 refills | Status: DC

## 2022-04-11 MED ORDER — METHOCARBAMOL 500 MG PO TABS: 500.0000 mg | ORAL_TABLET | Freq: Four times a day (QID) | ORAL | Status: DC | PRN

## 2022-04-11 MED ORDER — BUPROPION HCL ER (XL) 150 MG PO TB24
150.0000 mg | ORAL_TABLET | Freq: Every day | ORAL | Status: DC
  Administered 2022-04-12: 150 mg via ORAL
  Filled 2022-04-11: qty 1

## 2022-04-11 MED ORDER — BISACODYL 10 MG RE SUPP: 10.0000 mg | Freq: Every day | RECTAL | Status: DC | PRN

## 2022-04-11 MED ORDER — HYDROCODONE-ACETAMINOPHEN 5-325 MG PO TABS
1.0000 | ORAL_TABLET | ORAL | Status: DC | PRN
  Administered 2022-04-12: 1 via ORAL
  Administered 2022-04-18: 2 via ORAL
  Administered 2022-04-24: 1 via ORAL
  Filled 2022-04-11: qty 2
  Filled 2022-04-11 (×4): qty 1

## 2022-04-11 MED ORDER — ASPIRIN 81 MG PO TBEC
81.0000 mg | DELAYED_RELEASE_TABLET | Freq: Every day | ORAL | Status: DC
  Administered 2022-04-12 – 2022-04-26 (×14): 81 mg via ORAL
  Filled 2022-04-11 (×14): qty 1

## 2022-04-11 MED ORDER — PHENOL 1.4 % MT LIQD: 1.0000 | OROMUCOSAL | Status: DC | PRN

## 2022-04-11 MED ORDER — FERROUS SULFATE 325 (65 FE) MG PO TABS
325.0000 mg | ORAL_TABLET | Freq: Every day | ORAL | Status: DC
  Administered 2022-04-12 – 2022-04-26 (×14): 325 mg via ORAL
  Filled 2022-04-11 (×14): qty 1

## 2022-04-11 MED ORDER — FLEET ENEMA 7-19 GM/118ML RE ENEM: 1.0000 | ENEMA | Freq: Once | RECTAL | Status: DC | PRN

## 2022-04-11 MED ORDER — MENTHOL 3 MG MT LOZG: 1.0000 | LOZENGE | OROMUCOSAL | Status: DC | PRN

## 2022-04-11 MED ORDER — METOPROLOL TARTRATE 25 MG PO TABS
25.0000 mg | ORAL_TABLET | Freq: Two times a day (BID) | ORAL | Status: DC
  Administered 2022-04-11 – 2022-04-26 (×27): 25 mg via ORAL
  Filled 2022-04-11 (×28): qty 1

## 2022-04-11 MED ORDER — ACETAMINOPHEN 325 MG PO TABS
325.0000 mg | ORAL_TABLET | Freq: Four times a day (QID) | ORAL | Status: DC | PRN
  Administered 2022-04-16 – 2022-04-21 (×4): 650 mg via ORAL
  Filled 2022-04-11: qty 2
  Filled 2022-04-11: qty 1
  Filled 2022-04-11 (×3): qty 2

## 2022-04-11 MED ORDER — ZOLPIDEM TARTRATE 5 MG PO TABS: 5.0000 mg | ORAL_TABLET | Freq: Every evening | ORAL | Status: DC | PRN

## 2022-04-11 MED ORDER — INSULIN ASPART 100 UNIT/ML IJ SOLN: 0.0000 [IU] | Freq: Three times a day (TID) | INTRAMUSCULAR | 11 refills | Status: DC

## 2022-04-11 MED ORDER — POLYETHYLENE GLYCOL 3350 17 G PO PACK: 17.0000 g | PACK | Freq: Every day | ORAL | 0 refills | Status: DC | PRN

## 2022-04-11 MED ORDER — METHOCARBAMOL 1000 MG/10ML IJ SOLN: 500.0000 mg | Freq: Four times a day (QID) | INTRAVENOUS | Status: DC | PRN

## 2022-04-11 MED ORDER — ALUM & MAG HYDROXIDE-SIMETH 200-200-20 MG/5ML PO SUSP
30.0000 mL | ORAL | 0 refills | Status: DC | PRN
Start: 2022-04-11 — End: 2022-04-26

## 2022-04-11 MED ORDER — DOCUSATE SODIUM 100 MG PO CAPS
100.0000 mg | ORAL_CAPSULE | Freq: Two times a day (BID) | ORAL | Status: DC
  Administered 2022-04-11 – 2022-04-26 (×24): 100 mg via ORAL
  Filled 2022-04-11 (×29): qty 1

## 2022-04-11 MED ORDER — FERROUS SULFATE 325 (65 FE) MG PO TABS: 325.0000 mg | ORAL_TABLET | Freq: Every day | ORAL | 3 refills | Status: DC

## 2022-04-11 MED ORDER — INSULIN GLARGINE-YFGN 100 UNIT/ML ~~LOC~~ SOLN: 8.0000 [IU] | Freq: Two times a day (BID) | SUBCUTANEOUS | 11 refills | Status: DC

## 2022-04-11 MED ORDER — SENNA 8.6 MG PO TABS
1.0000 | ORAL_TABLET | Freq: Two times a day (BID) | ORAL | Status: DC
  Administered 2022-04-11 – 2022-04-26 (×21): 8.6 mg via ORAL
  Filled 2022-04-11 (×30): qty 1

## 2022-04-11 MED ORDER — INSULIN GLARGINE-YFGN 100 UNIT/ML ~~LOC~~ SOLN
5.0000 [IU] | Freq: Two times a day (BID) | SUBCUTANEOUS | Status: DC
Start: 2022-04-11 — End: 2022-04-26
  Administered 2022-04-11 – 2022-04-26 (×26): 5 [IU] via SUBCUTANEOUS
  Filled 2022-04-11 (×31): qty 0.05

## 2022-04-11 NOTE — Progress Notes (Signed)
PT Cancellation Note  Patient Details Name: John [name] MRN: [id number] DOB: [birth year]   Cancelled Treatment:  PT attempt. Pt currently working with OT. Will continue to follow and progress as able per current POC      Willette Pa 04/11/2022, 1:37 PM

## 2022-04-11 NOTE — TOC Progression Note (Signed)
Transition of Care [facility]) - Progression Note    Patient Details  Name: John [name] MRN: [id number] Date of Birth: [birth year]  [facility] CM/SW [place], RN Phone Number: 04/11/2022, 10:18 AM  Clinical Narrative:    Per Dr Ronny Flurry Psych will accept once they have a bed   Expected Discharge Plan: [place] Barriers to Discharge: Continued Medical Work up  Expected Discharge Plan and Services Expected Discharge Plan: [place] In-house Referral: Clinical Social Work   Post Acute Care Choice:  (TBD) Living arrangements for the past 2 months: Single Family Home                                       Social Determinants of Health (SDOH) Interventions    Readmission Risk Interventions     No data to display

## 2022-04-11 NOTE — Discharge Summary (Signed)
Physician Discharge Summary   Patient: John [name] MRN: [id number] DOB: 25-Mar-1941  Admit date:     04/06/2022  Discharge date: 04/11/22  Discharge Physician: Lorella Nimrod   PCP: Juluis Pitch, MD   Recommendations at discharge:  Please obtain CBC and BMP in 1 week Follow-up with orthopedic surgery in 2 weeks  Discharge Diagnoses: Principal Problem:   Fracture of femoral neck ([place]) Active Problems:   Severe recurrent major depression without psychotic features ([place])   Paroxysmal atrial fibrillation ([place])   Essential hypertension   Type 2 diabetes mellitus with hyperlipidemia ([place])   Dehydration   Hospital Course: Taken from prior notes.   John [name] is a 81 y.o. male with medical history significant for paroxysmal A-fib, depression, diabetes mellitus, history of subdural hematoma, hypertension who was admitted to the [facility] psych unit for management of depression for which he has been getting ECT. Patient states that he got up to use the bathroom last night and fell.  He denies feeling dizzy or lightheaded and states that he thinks he tripped and fell.  He got back in bed and this fall was unwitnessed.  This morning he started complaining of pain in the right hip and groin area which he rated a 7 x 10 in intensity at its worst.  He has been unable to bear weight on his right leg and was moved from his room in a recliner. X-ray showed displaced fracture in the neck of right femur. Patient was taken to the OR for right hip arthroplasty.   6/12 status post right hip arthroplasty by Dr. Sabra Heck on 6/11 6/13 PT rec. SNF. Will need clearance from psychiatry for discharge to snf. Currently with sitter.  6/14: Patient remained stable.  Denies any suicidal ideations.  Continued to have some depression.  No sign of any agitation.  No sitter required. Will appreciate psych help so we can discharge him to SNF for rehab. Dr. Weber Cooks will see and placed his recommendations today.  6/15:  Patient remained medically stable.  Behavioral health is going to readmit him to complete ECT. Patient is being discharged back to behavioral health.  He was placed on baby aspirin by orthopedics for DVT prophylaxis.  He was also given Norco along with bowel regimen to be used as needed for pain and constipation. Atenolol was discontinued and he will continue with metoprolol. Patient need to have a follow-up with orthopedic surgery in 2 weeks  Patient will continue with current medications and follow-up with his providers.  Assessment and Plan: * Fracture of femoral neck (HCC) Status post mechanical fall with displaced fracture of right femoral neck. Rule out pathologic fracture since patient has a history of prostate cancer Immobilize right lower extremity S/p right hip arthroplasty by orthopedic surgery on 6/11, PT is recommending SNF. -Continue with pain management. -Continue with supportive care  Severe recurrent major depression without psychotic features [facility]) Patient admitted to the [facility] psych unit for management of his depression and was receiving ECT therapy.  Denies any suicidal thoughts. -Continue bupropion -Consults psychiatry-will appreciate their recommendations -No need for sitter  Paroxysmal atrial fibrillation (HCC) Currently in sinus rhythm Continue metoprolol for rate control Not a candidate for anticoagulation due to history of spontaneous subdural hematoma -Can restart aspirin  Essential hypertension Blood pressure within goal. -Continue metoprolol  Type 2 diabetes mellitus with hyperlipidemia (HCC) CBG elevated above 200.  A1c of 5.6 on 03/19/2022. -Add Semglee 5 units twice daily -Continue with SSI -Continue to monitor  Dehydration Patient  received normal saline on admission which has been not discontinued. -Encourage p.o. hydration   Pain control - [place] Controlled Substance Reporting System database was reviewed. and patient was instructed,  not to drive, operate heavy machinery, perform activities at heights, swimming or participation in water activities or provide baby-sitting services while on Pain, Sleep and Anxiety Medications; until their outpatient Physician has advised to do so again. Also recommended to not to take more than prescribed Pain, Sleep and Anxiety Medications.  Consultants: Orthopedic surgery, psychiatry Procedures performed: Right hemiarthroplasty Disposition:  Behavioral health Diet recommendation:  Discharge Diet Orders (From admission, onward)     Start     Ordered   04/11/22 0000  Diet - low sodium heart healthy        04/11/22 1351           Cardiac and Carb modified diet DISCHARGE MEDICATION: Allergies as of 04/11/2022   No Known Allergies      Medication List     STOP taking these medications    atenolol 50 MG tablet Commonly known as: TENORMIN       TAKE these medications    alum & mag hydroxide-simeth 200-200-20 MG/5ML suspension Commonly known as: MAALOX/MYLANTA Take 30 mLs by mouth every 4 (four) hours as needed for indigestion.   aspirin EC 81 MG tablet Take 81 mg by mouth daily.   buPROPion 150 MG 24 hr tablet Commonly known as: WELLBUTRIN XL Take 1 tablet (150 mg total) by mouth daily.   docusate sodium 100 MG capsule Commonly known as: COLACE Take 1 capsule (100 mg total) by mouth 2 (two) times daily.   ferrous sulfate 325 (65 FE) MG tablet Take 1 tablet (325 mg total) by mouth daily with breakfast. Start taking on: April 12, 2022   HYDROcodone-acetaminophen 5-325 MG tablet Commonly known as: NORCO/VICODIN Take 1-2 tablets by mouth every 4 (four) hours as needed for moderate pain (pain score 4-6).   insulin aspart 100 UNIT/ML injection Commonly known as: novoLOG Inject 0-9 Units into the skin 3 (three) times daily with meals.   insulin glargine-yfgn 100 UNIT/ML injection Commonly known as: SEMGLEE Inject 0.08 mLs (8 Units total) into the skin 2 (two)  times daily.   LORazepam 1 MG tablet Commonly known as: ATIVAN Take 1 tablet (1 mg total) by mouth every 4 (four) hours as needed for anxiety, sedation or sleep.   methocarbamol 500 MG tablet Commonly known as: ROBAXIN Take 1 tablet (500 mg total) by mouth every 6 (six) hours as needed for muscle spasms.   metoprolol tartrate 25 MG tablet Commonly known as: LOPRESSOR Take 1 tablet (25 mg total) by mouth 2 (two) times daily.   polyethylene glycol 17 g packet Commonly known as: MIRALAX / GLYCOLAX Take 17 g by mouth daily as needed.   traZODone 100 MG tablet Commonly known as: DESYREL Take 1 tablet (100 mg total) by mouth at bedtime as needed for sleep.               Discharge Care Instructions  (From admission, onward)           Start     Ordered   04/11/22 0000  Leave dressing on - Keep it clean, dry, and intact until clinic visit        04/11/22 1351            Follow-up Information     Earnestine Leys, MD. Go on 04/25/2022.   Specialty: Orthopedic Surgery Why: For  wound re-check Appt @ 3:00 pm Contact information: [place] [phone]                Discharge Exam: Danley Danker Weights   04/10/22 1653  Weight: 90.5 kg   General.     In no acute distress. Pulmonary.  Lungs clear bilaterally, normal respiratory effort. CV.  Regular rate and rhythm, no JVD, rub or murmur. Abdomen.  Soft, nontender, nondistended, BS positive. CNS.  Alert and oriented .  No focal neurologic deficit. Extremities.  No edema, no cyanosis, pulses intact and symmetrical. Psychiatry.  Judgment and insight appears normal.   Condition at discharge: stable  The results of significant diagnostics from this hospitalization (including imaging, microbiology, ancillary and laboratory) are listed below for reference.   Imaging Studies: DG Hip Port Unilat With Pelvis 1V Right  Result Date: 04/07/2022 CLINICAL DATA:  Postop right  hemiarthroplasty. EXAM: DG HIP (WITH OR WITHOUT PELVIS) 1V PORT RIGHT COMPARISON:  04/06/2022. FINDINGS: Right hip hemiarthroplasty appears well seated and well-positioned. No acute fracture or evidence of an operative complication. IMPRESSION: Well-positioned right hip hemiarthroplasty. Electronically Signed   By: Lajean Manes M.D.   On: 04/07/2022 11:13   CT HEAD WO CONTRAST (5MM)  Result Date: 04/06/2022 CLINICAL DATA:  Status post trauma, altered mental status. EXAM: CT HEAD WITHOUT CONTRAST TECHNIQUE: Contiguous axial images were obtained from the base of the skull through the vertex without intravenous contrast. RADIATION DOSE REDUCTION: This exam was performed according to the departmental dose-optimization program which includes automated exposure control, adjustment of the mA and/or kV according to patient size and/or use of iterative reconstruction technique. COMPARISON:  Mar 25, 2022 FINDINGS: Brain: There is mild cerebral atrophy with widening of the extra-axial spaces and ventricular dilatation. There are areas of decreased attenuation within the white matter tracts of the supratentorial brain, consistent with microvascular disease changes. Vascular: No hyperdense vessel or unexpected calcification. Skull: Normal. Negative for fracture or focal lesion. Sinuses/Orbits: A left frontal burr hole and partial craniotomy defect is seen. Other: None. IMPRESSION: 1. No acute intracranial abnormality. 2. Generalized cerebral atrophy with chronic white matter small vessel ischemic changes. Electronically Signed   By: Virgina Norfolk M.D.   On: 04/06/2022 18:25   Chest Portable 1 View  Result Date: 04/06/2022 CLINICAL DATA:  Short of breath. EXAM: PORTABLE CHEST 1 VIEW COMPARISON:  11/09/2021. FINDINGS: Cardiac silhouette normal in size.  No mediastinal or hilar masses. Clear lungs.  No pleural effusion or pneumothorax. Skeletal structures are grossly intact. IMPRESSION: No active disease.  Electronically Signed   By: Lajean Manes M.D.   On: 04/06/2022 18:05   DG HIP UNILAT WITH PELVIS 2-3 VIEWS RIGHT  Result Date: 04/06/2022 CLINICAL DATA:  Trauma, fall EXAM: DG HIP (WITH OR WITHOUT PELVIS) 2-3V RIGHT COMPARISON:  None Available. FINDINGS: Acute fracture is seen in the neck of right femur. There is 9 mm offset in the alignment of medial cortical margin of neck. There is no dislocation. Degenerative changes are noted in the visualized lower lumbar spine. Surgical clips are seen in the region of prostate. IMPRESSION: There is displaced fracture in the neck of right femur. Electronically Signed   By: Elmer Picker M.D.   On: 04/06/2022 13:50   ECHOCARDIOGRAM COMPLETE  Result Date: 04/02/2022    ECHOCARDIOGRAM REPORT   Patient Name:   SHRAGA CUSTARD Date of Exam: 04/02/2022 Medical Rec #:  [id number]   Height:       74.0 in  Accession #:    [id number]  Weight:       220.5 lb Date of Birth:  Mar 01, 1941    BSA:          2.265 m Patient Age:    81 years    BP:           174/101 mmHg Patient Gender: M           HR:           80 bpm. Exam Location:  [facility] Procedure: 2D Echo, Cardiac Doppler and Color Doppler Indications:     I48.91 Atrial Fibrillation  History:         Patient has no prior history of Echocardiogram examinations.                  Arrythmias:Atrial Fibrillation; Risk Factors:Hypertension,                  Sleep Apnea and Diabetes.  Sonographer:     Rosalia Hammers Referring Phys:  [id number] Clance Boll BERGE Diagnosing Phys: Nelva Bush MD  Sonographer Comments: Technically challenging study due to limited acoustic windows. Image acquisition challenging due to uncooperative patient and Image acquisition challenging due to respiratory motion. IMPRESSIONS  1. Left ventricular ejection fraction, by estimation, is >55%. The left ventricle has normal function. Left ventricular endocardial border not optimally defined to evaluate regional wall motion. 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 Left ventricular diastolic parameters are consistent with Grade I diastolic dysfunction (impaired relaxation).  2. Right ventricular systolic function is normal. The right ventricular size is normal. Mildly increased right ventricular wall thickness.  3. The mitral valve is grossly normal. No evidence of mitral valve regurgitation. No evidence of mitral stenosis.  4. The aortic valve was not well visualized. Aortic valve regurgitation is not visualized. No aortic stenosis is present.  5. Aortic dilatation noted. 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 FINDINGS  Left Ventricle: Left ventricular ejection fraction, by estimation, is >55%. The left ventricle has normal function. Left ventricular endocardial border not optimally defined to evaluate regional wall motion. The left ventricular internal cavity size was  normal in size. There is moderate left ventricular hypertrophy. Left ventricular diastolic parameters are consistent with Grade I diastolic dysfunction (impaired relaxation). Right Ventricle: The right ventricular size is normal. Mildly increased right ventricular wall thickness. Right ventricular systolic function is normal. Left Atrium: Left atrial size was normal in size. Right Atrium: Right atrial size was normal in size. Pericardium: The pericardium was not well visualized. Mitral Valve: The mitral valve is grossly normal. No evidence of mitral valve regurgitation. No evidence of mitral valve stenosis. MV peak gradient, 3.1 mmHg. The mean mitral valve gradient is 1.0 mmHg. Tricuspid Valve: The tricuspid valve is not well visualized. Tricuspid valve regurgitation is not demonstrated. Aortic Valve: The aortic valve was not well visualized. Aortic valve regurgitation is not visualized. No aortic stenosis is present. Aortic valve mean gradient measures 3.0 mmHg. Aortic valve peak gradient measures 5.7  mmHg. Aortic valve area, by VTI measures 3.68 cm. Pulmonic Valve: The pulmonic valve was not well visualized. Pulmonic valve regurgitation is not visualized. No evidence of pulmonic stenosis. Aorta: Aortic dilatation noted. 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 Pulmonary Artery: The pulmonary artery is not well seen. Venous: The inferior  vena cava was not well visualized. IAS/Shunts: The interatrial septum was not well visualized.  LEFT VENTRICLE PLAX 2D LVIDd:         4.21 cm   Diastology LVIDs:         3.02 cm   LV e' medial:    6.31 cm/s LV PW:         1.60 cm   LV E/e' medial:  9.6 LV IVS:        1.57 cm   LV e' lateral:   8.70 cm/s LVOT diam:     2.20 cm   LV E/e' lateral: 6.9 LV SV:         81 LV SV Index:   36 LVOT Area:     3.80 cm  RIGHT VENTRICLE RV S prime:     14.70 cm/s LEFT ATRIUM             Index        RIGHT ATRIUM           Index LA diam:        4.50 cm 1.99 cm/m   RA Area:     16.80 cm LA Vol (A2C):   46.3 ml 20.44 ml/m  RA Volume:   37.50 ml  16.55 ml/m LA Vol (A4C):   53.2 ml 23.48 ml/m LA Biplane Vol: 50.5 ml 22.29 ml/m  AORTIC VALVE                    PULMONIC VALVE AV Area (Vmax):    3.55 cm     PV Vmax:       0.98 m/s AV Area (Vmean):   3.78 cm     PV Vmean:      66.000 cm/s AV Area (VTI):     3.68 cm     PV VTI:        0.159 m AV Vmax:           119.00 cm/s  PV Peak grad:  3.8 mmHg AV Vmean:          86.900 cm/s  PV Mean grad:  2.0 mmHg AV VTI:            0.221 m AV Peak Grad:      5.7 mmHg AV Mean Grad:      3.0 mmHg LVOT Vmax:         111.00 cm/s LVOT Vmean:        86.400 cm/s LVOT VTI:          0.214 m LVOT/AV VTI ratio: 0.97  AORTA Ao Root diam: 4.00 cm MITRAL VALVE MV Area (PHT): 3.01 cm    SHUNTS MV Area VTI:   3.75 cm    Systemic VTI:  0.21 m MV Peak grad:  3.1 mmHg    Systemic Diam: 2.20 cm MV Mean grad:  1.0 mmHg MV Vmax:       0.88 m/s MV Vmean:      55.2 cm/s MV Decel Time:  252 msec MV E velocity: 60.40 cm/s MV A velocity: 84.80 cm/s MV E/A ratio:  0.71 Harrell Gave End MD Electronically signed by Nelva Bush MD Signature Date/Time: 04/02/2022/1:04:45 PM    Final    CT HEAD WO CONTRAST (5MM)  Result Date: 03/25/2022 CLINICAL DATA:  Head trauma, minor (Age >= 65y).  Fall. EXAM: CT HEAD WITHOUT CONTRAST TECHNIQUE: Contiguous axial images were obtained from the base of the skull through the vertex without intravenous contrast. RADIATION DOSE  REDUCTION: This exam was performed according to the departmental dose-optimization program which includes automated exposure control, adjustment of the mA and/or kV according to patient size and/or use of iterative reconstruction technique. COMPARISON:  07/20/2015 FINDINGS: Brain: No acute intracranial abnormality. Specifically, no hemorrhage, hydrocephalus, mass lesion, acute infarction, or significant intracranial injury. Vascular: No hyperdense vessel or unexpected calcification. Skull: No acute calvarial abnormality. Postoperative changes in the left frontal bone, unchanged. Sinuses/Orbits: No acute findings Other: None IMPRESSION: No acute intracranial abnormality. Electronically Signed   By: Rolm Baptise M.D.   On: 03/25/2022 02:10    Microbiology: Results for orders placed or performed during the hospital encounter of 04/01/22  SARS Coronavirus 2 by RT PCR (hospital order, performed in [facility] hospital lab) *cepheid single result test* Anterior Nasal Swab     Status: None   Collection Time: 04/04/22  3:08 PM   Specimen: Anterior Nasal Swab  Result Value Ref Range Status   SARS Coronavirus 2 by RT PCR NEGATIVE NEGATIVE Final    Comment: (NOTE) SARS-CoV-2 target nucleic acids are NOT DETECTED.  The SARS-CoV-2 RNA is generally detectable in upper and lower respiratory specimens during the acute phase of infection. The lowest concentration of SARS-CoV-2 viral copies this assay can detect is 250 copies / mL. A negative result  does not preclude SARS-CoV-2 infection and should not be used as the sole basis for treatment or other patient management decisions.  A negative result may occur with improper specimen collection / handling, submission of specimen other than nasopharyngeal swab, presence of viral mutation(s) within the areas targeted by this assay, and inadequate number of viral copies (<250 copies / mL). A negative result must be combined with clinical observations, patient history, and epidemiological information.  Fact Sheet for Patients:   https://www.patel.info/  Fact Sheet for Healthcare Providers: https://hall.com/  This test is not yet approved or  cleared by the [place] FDA and has been authorized for detection and/or diagnosis of SARS-CoV-2 by FDA under an Emergency Use Authorization (EUA).  This EUA will remain in effect (meaning this test can be used) for the duration of the COVID-19 declaration under Section 564(b)(1) of the Act, 21 U.S.C. section 360bbb-3(b)(1), unless the authorization is terminated or revoked sooner.  Performed at [facility], [address] [place],  [address]     Labs: CBC: Recent Labs  Lab 04/05/22 0724 04/07/22 1434 04/08/22 0440 04/09/22 0404 04/10/22 0451  WBC 7.7 12.8* 10.0 9.4 8.7  HGB 19.6* 15.8 16.0 14.4 14.3  HCT 58.8* 48.2 46.8 42.4 40.5  MCV 95.0 95.8 92.1 93.2 90.8  PLT 247 149* 154 116* 097   Basic Metabolic Panel: Recent Labs  Lab 04/05/22 0724 04/06/22 0736 04/07/22 1434 04/08/22 0440 04/09/22 0404 04/10/22 0451  NA 140 140  --  136 135 136  K 3.4* 4.2  --  3.7 3.8 3.9  CL 108 112*  --  110 107 108  CO2 19* 25  --  20* 24 21*  GLUCOSE 162* 115*  --  145* 123* 142*  BUN 26* 38*  --  '17 18 19  '$ CREATININE 1.43* 1.12 0.81 0.81 0.82 0.65  CALCIUM 9.1 9.2  --  8.4* 8.2* 8.6*   Liver Function Tests: No results for input(s): "AST", "ALT", "ALKPHOS", "BILITOT",  "PROT", "ALBUMIN" in the last 168 hours. CBG: Recent Labs  Lab 04/10/22 1613 04/10/22 2012 04/11/22 0521 04/11/22 0805 04/11/22 1157  GLUCAP 230* 204* 175* 137* 160*    Discharge time  spent: greater than 30 minutes.  This record has been created using Systems analyst. Errors have been sought and corrected,but may not always be located. Such creation errors do not reflect on the standard of care.   Signed: Lorella Nimrod, MD [facility] 04/11/2022

## 2022-04-11 NOTE — Progress Notes (Signed)
Occupational Therapy Treatment Patient Details Name: John [name] MRN: [id number] DOB: 09-28-1941 Today's Date: 04/11/2022   History of present illness Pt is a 81 yo male with PMH of depression, anxiety, HTN, DM, OSA, prostate cancer, Horner syndrome, dissection of carotid artery, subdural hematoma, epidural hematoma. Previously hospitalized from 6/5-6/8 for severe depression and found to have abnormal EKG with A-fib. Pt receiving ECT for management of depression. Pt admitted 6/10 due to R femoral neck fx due to a fall in the  bathroom on the evening of 6/9. Pt underwent R hip hemiarthroplasty on 6/11.   OT comments  John [name] continues to make good progress in terms of functional mobility. Today he describes his pain as 2/10 at beginning of session and 3/10 at end, after standing/ambulating for 20+ minutes. He performed grooming in sitting and standing, toileting, transfers, all with SUPV-contact guard, while adhering to WB precautions. Pt is able to recall/describe hip precautions. He displayed better memory and alertness this day, was more conversational; continued to describe his mood as "bleary" and "down." He is able to say that he is pleased with decision to DC to geri-psych rather than SNF, whereas in earlier sessions he has said he felt "overwhelmed" when asked to make any decision.   Recommendations for follow up therapy are one component of a multi-disciplinary discharge planning process, led by the attending physician.  Recommendations may be updated based on patient status, additional functional criteria and insurance authorization.    Follow Up Recommendations  Other (comment) (geri-psych unit)    Assistance Recommended at Discharge Intermittent Supervision/Assistance  Patient can return home with the following  A little help with walking and/or transfers;A little help with bathing/dressing/bathroom;Assistance with cooking/housework;Assist for transportation;Help with stairs or ramp for  entrance   Equipment Recommendations  Other (comment) (RW)    Recommendations for Other Services      Precautions / Restrictions Precautions Precautions: Posterior Hip;Fall Precaution Booklet Issued: Yes (comment) Required Braces or Orthoses: Other Brace Other Brace: Hip ABDuction pillow in supine Restrictions Weight Bearing Restrictions: Yes RLE Weight Bearing: Partial weight bearing RLE Partial Weight Bearing Percentage or Pounds: 50       Mobility Bed Mobility               General bed mobility comments: in recliner pre/post session    Transfers Overall transfer level: Needs assistance Equipment used: Rolling walker (2 wheels) Transfers: Sit to/from Stand Sit to Stand: Supervision     Step pivot transfers: Min guard     General transfer comment: Able to stand from recliner & toilet w/o physical assist     Balance Overall balance assessment: Needs assistance Sitting-balance support: Feet supported, No upper extremity supported Sitting balance-Leahy Scale: Good Sitting balance - Comments: Sitting in recliner at start of session with back unsupported, no UE support, and feet on floor. Able to reach for RW without LOB.   Standing balance support: Reliant on assistive device for balance, Bilateral upper extremity supported, Single extremity supported Standing balance-Leahy Scale: Fair Standing balance comment: Able to reach beyond BOS while maintaining balance and WBing precautions                           ADL either performed or assessed with clinical judgement   ADL Overall ADL's : Needs assistance/impaired     Grooming: Wash/dry hands;Wash/dry face;Oral care;Applying deodorant;Brushing hair;Sitting;Standing;Supervision/safety Grooming Details (indicate cue type and reason): completed partially in sitting, partially in standing at sink,  demonstrating good ability to adhere to WB precautions Upper Body Bathing: Supervision/ safety;Standing        Upper Body Dressing : Supervision/safety;Standing       Toilet Transfer: Rolling walker (2 wheels);Comfort height toilet;Min guard Toilet Transfer Details (indicate cue type and reason): Able to ambulate to bathroom, perform toileting with close SUPV for comfort but no physical assistance required Toileting- Clothing Manipulation and Hygiene: Supervision/safety              Extremity/Trunk Assessment Upper Extremity Assessment Upper Extremity Assessment: Overall WFL for tasks assessed   Lower Extremity Assessment Lower Extremity Assessment: RLE deficits/detail RLE Deficits / Details: improved active movement and decreased pain in R hip & thigh today compared to yesterday's session RLE Sensation: WNL   Cervical / Trunk Assessment Cervical / Trunk Assessment: Normal    Vision       Perception     Praxis      Cognition Arousal/Alertness: Awake/alert Behavior During Therapy: WFL for tasks assessed/performed, Flat affect Overall Cognitive Status: Within Functional Limits for tasks assessed                                 General Comments: A&Ox4, more conversational and engaged than during previous session, but with continued flat affect; says he feels "bleary."        Exercises Other Exercises Other Exercises: transfers, grooming, toileting, ambulation. Educ re: WB precautions, safety awareness, balance, falls prevention    Shoulder Instructions       General Comments      Pertinent Vitals/ Pain       Pain Assessment Pain Assessment: 0-10 Pain Score: 3  Pain Location: R hip Pain Descriptors / Indicators: Operative site guarding, Discomfort Pain Intervention(s): Limited activity within patient's tolerance, Monitored during session, Repositioned  Home Living                                          Prior Functioning/Environment              Frequency  Min 2X/week        Progress Toward Goals  OT Goals(current  goals can now be found in the care plan section)  Progress towards OT goals: Progressing toward goals  Acute Rehab OT Goals OT Goal Formulation: With patient Time For Goal Achievement: 04/23/22 Potential to Achieve Goals: Good  Plan Discharge plan remains appropriate;Frequency remains appropriate    Co-evaluation                 AM-PAC OT "6 Clicks" Daily Activity     Outcome Measure   Help from another person eating meals?: None Help from another person taking care of personal grooming?: A Little Help from another person toileting, which includes using toliet, bedpan, or urinal?: A Little Help from another person bathing (including washing, rinsing, drying)?: A Little Help from another person to put on and taking off regular upper body clothing?: A Little Help from another person to put on and taking off regular lower body clothing?: A Lot 6 Click Score: 18    End of Session Equipment Utilized During Treatment: Rolling walker (2 wheels)  OT Visit Diagnosis: Unsteadiness on feet (R26.81);Muscle weakness (generalized) (M62.81);Pain Pain - Right/Left: Right Pain - part of body: Hip   Activity Tolerance Patient tolerated treatment well  Patient Left in chair;with call bell/phone within reach;with chair alarm set   Nurse Communication          Time: 7353-2992 OT Time Calculation (min): 43 min  Charges: OT General Charges $OT Visit: 1 Visit OT Treatments $Self Care/Home Management : 38-52 mins  Josiah Lobo, PhD, MS, OTR/L 04/11/22, 2:24 PM

## 2022-04-11 NOTE — Plan of Care (Signed)
  Problem: Activity: Goal: Risk for activity intolerance will decrease Outcome: Progressing   Problem: Skin Integrity: Goal: Risk for impaired skin integrity will decrease Outcome: Progressing   

## 2022-04-11 NOTE — Group Note (Deleted)
[place] LCSW Group Therapy Note   Group Date: 04/11/2022 Start Time:  2:15 PM End Time:  3:20 PM   Type of Therapy/Topic:  Group Therapy:  Balance in Life  Participation Level:  {B[facility] PARTICIPATION TLXBW:62035}   Description of Group:    This group will address the concept of balance and how it feels and looks when one is unbalanced. Patients will be encouraged to process areas in their lives that are out of balance, and identify reasons for remaining unbalanced. Facilitators will guide patients utilizing problem- solving interventions to address and correct the stressor making their life unbalanced. Understanding and applying boundaries will be explored and addressed for obtaining  and maintaining a balanced life. Patients will be encouraged to explore ways to assertively make their unbalanced needs known to significant others in their lives, using other group members and facilitator for support and feedback.  Therapeutic Goals: 1. Patient will identify two or more emotions or situations they have that consume much of in their lives. 2. Patient will identify signs/triggers that life has become out of balance:  3. Patient will identify two ways to set boundaries in order to achieve balance in their lives:  4. Patient will demonstrate ability to communicate their needs through discussion and/or role plays  Summary of Patient Progress:    ***    Therapeutic Modalities:   Cognitive Behavioral Therapy Solution-Focused Therapy Assertiveness Training   [place] A [place], [place]

## 2022-04-11 NOTE — Progress Notes (Signed)
Pt admitted to [facility], S/P right hip surgery post fall on prior admission to [facility]. Pt presents with flat affect, depressed mood, concrete with logical and soft speech on interactions. Denies SI, HI, AVH and reported his right hip pain 2/10 and tolerable on initial assessment. Ambulatory with walker and needs standby assist. Denies all forms of abuse (verbal, physical and emotional) as well as substance. Skin assessed, ecchymosis noted to right hip, post surgical dressing intact as well. Belongings searched and items deemed contraband secured in assigned locker. Pt ambulatory to unit via walker, tolerated activity well. Required stand by assist for ADLs. Unit orientation done (room, bathroom & call lights), routines discussed, care plan reviewed with pt and admission documents signed. Safety checks initiated at Q 15 minutes intervals without self harm gestures / outburst. Fall precaution initiated without incident. Assistive devices (glasses & walker) at bed side. Pt encouraged to use call light for assistance for mobility to and from bathroom.Emotional support and reassurance provided to pt. CBG done and documented. Vitals WNL.Pt tolerated supper well. Observed resting in bed.

## 2022-04-11 NOTE — Progress Notes (Signed)
IV removed, report given to [place], Therapist, sports. See MAR.  Discharge paperwork in package.

## 2022-04-11 NOTE — Tx Team (Signed)
Initial Treatment Plan 04/11/2022 7:40 PM John [name] [id number]:[id number]    PATIENT STRESSORS: Health problems   Loss of full ambulatory function prior to surgery   Medication change or noncompliance     PATIENT STRENGTHS: Communication skills  Motivation for treatment/growth  Supportive family/friends    PATIENT IDENTIFIED PROBLEMS: Alterations in mood "I'm depressed, sad about all of this happening to me, the fall and the surgery".    Limited mobility R/T Rt. Hip surgery    Impaired judgement (confusion)             DISCHARGE CRITERIA:  Improved stabilization in mood, thinking, and/or behavior Safe-care adequate arrangements made Verbal commitment to aftercare and medication compliance  PRELIMINARY DISCHARGE PLAN: Outpatient therapy Return to previous living arrangement  PATIENT/FAMILY INVOLVEMENT: This treatment plan has been presented to and reviewed with the patient, John [name].  The patient have been given the opportunity to ask questions and make suggestions.  Keane Police, RN 04/11/2022, 7:40 PM

## 2022-04-11 NOTE — Care Management Important Message (Signed)
Important Message  Patient Details  Name: John [name] MRN: [id number] Date of Birth: [birth year]   Medicare Important Message Given:  Yes     Juliann Pulse A Sydnie Sigmund 04/11/2022, 12:10 PM

## 2022-04-11 NOTE — Progress Notes (Signed)
Physical Therapy Treatment Patient Details Name: John [name] MRN: [id number] DOB: [birth year] Today's Date: 04/11/2022   History of Present Illness Pt is a 81 yo male with PMH of depression, anxiety, HTN, DM, OSA, prostate cancer, Horner syndrome, dissection of carotid artery, subdural hematoma, epidural hematoma. Previously hospitalized from 6/5-6/8 for severe depression and found to have abnormal EKG with A-fib. Pt receiving ECT for management of depression. Pt admitted 6/10 due to R femoral neck fx due to a fall in the  bathroom on the evening of 6/9. Pt underwent R hip hemiarthroplasty on 6/11.    PT Comments    Pt was sitting in recliner upon arriving. Continues to present with flat affect however much improved from 1 week prior (last time author saw pt). He is cooperative and pleasant. Requested to use BSC for BM however only able to pass gas. He was able to stand and ambulate with RW + Vcs. Pt is PWB and needs cueing for adhering to proper PWB. Overall pt is progressing well. Recommend continued skilled PT to maximize independence while decreasing caregiver  burden.      Recommendations for follow up therapy are one component of a multi-disciplinary discharge planning process, led by the attending physician.  Recommendations may be updated based on patient status, additional functional criteria and insurance authorization.  Follow Up Recommendations  Other (comment) (pt planning to DC to geri psych)     Assistance Recommended at Discharge Frequent or constant Supervision/Assistance  Patient can return home with the following A little help with walking and/or transfers;A little help with bathing/dressing/bathroom   Equipment Recommendations  Rolling walker (2 wheels);BSC/3in1       Precautions / Restrictions Precautions Precautions: Posterior Hip;Fall Precaution Booklet Issued: Yes (comment) Other Brace: Hip ABDuction pillow in supine Restrictions Weight Bearing Restrictions:  Yes RLE Weight Bearing: Partial weight bearing RLE Partial Weight Bearing Percentage or Pounds: 50     Mobility  Bed Mobility  General bed mobility comments: in recliner pre/post session    Transfers Overall transfer level: Needs assistance Equipment used: Rolling walker (2 wheels) Transfers: Sit to/from Stand Sit to Stand: Supervision    General transfer comment: pt was able to stand from recliner, BSC, and EOB without physical assistance. vcs for technique and sequencing improvements    Ambulation/Gait Ambulation/Gait assistance: Supervision, Min guard Gait Distance (Feet): 70 Feet Assistive device: Rolling walker (2 wheels) Gait Pattern/deviations: Step-to pattern, Antalgic Gait velocity: decreased.     General Gait Details: pt requires VCs for proper sequencing and increased UE support to decrease wt off RLE to adhere to [facility]. overall pt tolerated well.     Balance Overall balance assessment: Needs assistance Sitting-balance support: Feet supported, No upper extremity supported Sitting balance-Leahy Scale: Good     Standing balance support: Reliant on assistive device for balance, Bilateral upper extremity supported Standing balance-Leahy Scale: Fair     Cognition Arousal/Alertness: Awake/alert Behavior During Therapy: WFL for tasks assessed/performed Overall Cognitive Status: Within Functional Limits for tasks assessed    General Comments: pt is alert, still presents with flat affect however much imporved from previously observed by author. Pt did know he was PWB but needs vcs throguhout to properly adhere.               Pertinent Vitals/Pain Pain Assessment Pain Assessment: No/denies pain Pain Score: 4  Faces Pain Scale: Hurts a little bit Pain Location: R hip Pain Descriptors / Indicators: Operative site guarding, Discomfort Pain Intervention(s): Limited activity within patient's tolerance,  Monitored during session, Premedicated before session,  Repositioned, Ice applied     PT Goals (current goals can now be found in the care plan section) Acute Rehab PT Goals Patient Stated Goal: to go home Progress towards PT goals: Progressing toward goals    Frequency    BID      PT Plan Current plan remains appropriate       AM-PAC PT "6 Clicks" Mobility   Outcome Measure  Help needed turning from your back to your side while in a flat bed without using bedrails?: A Little Help needed moving from lying on your back to sitting on the side of a flat bed without using bedrails?: A Little Help needed moving to and from a bed to a chair (including a wheelchair)?: A Little Help needed standing up from a chair using your arms (e.g., wheelchair or bedside chair)?: A Little Help needed to walk in hospital room?: A Little Help needed climbing 3-5 steps with a railing? : A Lot 6 Click Score: 17    End of Session   Activity Tolerance: Patient tolerated treatment well Patient left: in chair;with call bell/phone within reach;with chair alarm set Nurse Communication: Mobility status PT Visit Diagnosis: Muscle weakness (generalized) (M62.81);History of falling (Z91.81);Pain Pain - Right/Left: Right Pain - part of body: Hip     Time: 7510-2585 PT Time Calculation (min) (ACUTE ONLY): 24 min  Charges:  $Gait Training: 8-22 mins $Therapeutic Activity: 8-22 mins                     Julaine Fusi PTA 04/11/22, 11:22 AM

## 2022-04-12 DIAGNOSIS — F332 Major depressive disorder, recurrent severe without psychotic features: Secondary | ICD-10-CM | POA: Diagnosis not present

## 2022-04-12 DIAGNOSIS — S72141A Displaced intertrochanteric fracture of right femur, initial encounter for closed fracture: Secondary | ICD-10-CM

## 2022-04-12 LAB — GLUCOSE, CAPILLARY
Glucose-Capillary: 122 mg/dL — ABNORMAL HIGH (ref 70–99)
Glucose-Capillary: 120 mg/dL — ABNORMAL HIGH (ref 70–99)
Glucose-Capillary: 179 mg/dL — ABNORMAL HIGH (ref 70–99)
Glucose-Capillary: 163 mg/dL — ABNORMAL HIGH (ref 70–99)

## 2022-04-12 LAB — CALR +MPL + E12-E15  (REFLEX)

## 2022-04-12 LAB — JAK2 V617F RFX CALR/MPL/E12-15

## 2022-04-12 MED ORDER — BUPROPION HCL ER (XL) 300 MG PO TB24
300.0000 mg | ORAL_TABLET | Freq: Every day | ORAL | Status: DC
  Administered 2022-04-13 – 2022-04-26 (×13): 300 mg via ORAL
  Filled 2022-04-12 (×13): qty 1

## 2022-04-12 MED ORDER — TRAZODONE HCL 50 MG PO TABS
50.0000 mg | ORAL_TABLET | Freq: Every evening | ORAL | Status: DC | PRN
  Administered 2022-04-14 – 2022-04-21 (×4): 50 mg via ORAL
  Filled 2022-04-12 (×4): qty 1

## 2022-04-12 MED ORDER — PRAZOSIN HCL 1 MG PO CAPS
1.0000 mg | ORAL_CAPSULE | Freq: Every day | ORAL | Status: DC
Start: 2022-04-12 — End: 2022-04-26
  Administered 2022-04-12 – 2022-04-25 (×14): 1 mg via ORAL
  Filled 2022-04-12 (×14): qty 1

## 2022-04-12 NOTE — Plan of Care (Signed)
Patient remain alert with flat affect and depressed mood. Remain calm and cooperative during assessment. Patient denies SI, HI, and AVH, but endorsed anxiety and depression. Denies pain with initial assessment. Right hip observed with clean dry dressing and bruised. Ambulatory with front wheel walker one person physical assist. Patient reintroduce to call bell and encouraged to call for help and not to attempt to get out of bed without calling for help. Patient verbalized understanding. Compliant with all due medication. Assisted to toilet x3, patient had a large bowel movement this shift. Patient is currently in bed resting in no apparent distress at this time. Q15 minute safety check continue. Patient remain safe on the unit.  Problem: Education: Goal: Knowledge of General Education information will improve Description: Including pain rating scale, medication(s)/side effects and non-pharmacologic comfort measures Outcome: Progressing   Problem: Health Behavior/Discharge Planning: Goal: Ability to manage health-related needs will improve Outcome: Progressing   Problem: Clinical Measurements: Goal: Ability to maintain clinical measurements within normal limits will improve Outcome: Progressing Goal: Will remain free from infection Outcome: Progressing Goal: Diagnostic test results will improve Outcome: Progressing Goal: Respiratory complications will improve Outcome: Progressing Goal: Cardiovascular complication will be avoided Outcome: Progressing   Problem: Activity: Goal: Risk for activity intolerance will decrease Outcome: Progressing   Problem: Nutrition: Goal: Adequate nutrition will be maintained Outcome: Progressing   Problem: Coping: Goal: Level of anxiety will decrease Outcome: Progressing   Problem: Elimination: Goal: Will not experience complications related to bowel motility Outcome: Progressing Goal: Will not experience complications related to urinary  retention Outcome: Progressing   Problem: Pain Managment: Goal: General experience of comfort will improve Outcome: Progressing   Problem: Safety: Goal: Ability to remain free from injury will improve Outcome: Progressing   Problem: Skin Integrity: Goal: Risk for impaired skin integrity will decrease Outcome: Progressing   Problem: Education: Goal: Knowledge of [place] General Education information/materials will improve Outcome: Progressing Goal: Emotional status will improve Outcome: Progressing Goal: Mental status will improve Outcome: Progressing Goal: Verbalization of understanding the information provided will improve Outcome: Progressing   Problem: Activity: Goal: Interest or engagement in activities will improve Outcome: Progressing Goal: Sleeping patterns will improve Outcome: Progressing   Problem: Coping: Goal: Ability to verbalize frustrations and anger appropriately will improve Outcome: Progressing Goal: Ability to demonstrate self-control will improve Outcome: Progressing   Problem: Health Behavior/Discharge Planning: Goal: Identification of resources available to assist in meeting health care needs will improve Outcome: Progressing Goal: Compliance with treatment plan for underlying cause of condition will improve Outcome: Progressing   Problem: Physical Regulation: Goal: Ability to maintain clinical measurements within normal limits will improve Outcome: Progressing   Problem: Safety: Goal: Periods of time without injury will increase Outcome: Progressing   Problem: Education: Goal: Utilization of techniques to improve thought processes will improve Outcome: Progressing Goal: Knowledge of the prescribed therapeutic regimen will improve Outcome: Progressing   Problem: Activity: Goal: Interest or engagement in leisure activities will improve Outcome: Progressing Goal: Imbalance in normal sleep/wake cycle will improve Outcome:  Progressing   Problem: Health Behavior/Discharge Planning: Goal: Ability to make decisions will improve Outcome: Progressing Goal: Compliance with therapeutic regimen will improve Outcome: Progressing   Problem: Safety: Goal: Ability to disclose and discuss suicidal ideas will improve Outcome: Progressing Goal: Ability to identify and utilize support systems that promote safety will improve Outcome: Progressing   Problem: Self-Concept: Goal: Will verbalize positive feelings about self Outcome: Progressing Goal: Level of anxiety will decrease Outcome: Progressing

## 2022-04-12 NOTE — BHH Counselor (Signed)
Adult Comprehensive Assessment  Patient ID: John [name], male   DOB: Jan 05, 1941, 81 y.o.   MRN: [id number]  Information Source: Information source: Patient (Partial assessment completed by PSA from 03/14/22)  Current Stressors:  Patient states their primary concerns and needs for treatment are:: "still feel anxious" Patient states their goals for this hospitilization and ongoing recovery are:: Pt states he wants to be able to feel better about himself Educational / Learning stressors: Pt denies. Employment / Job issues: Pt denies. Family Relationships: Pt denies. Financial / Lack of resources (include bankruptcy): Pt denies. Housing / Lack of housing: Pt denies. Physical health (include injuries & life threatening diseases): "diabetes, hernia surgery, prostate cancer hisotry" Social relationships: Pt denies. Substance abuse: Pt denies. Bereavement / Loss: "of a friend recently"   Living/Environment/Situation:  Living Arrangements: Spouse/significant other Who else lives in the home?: "wife" How long has patient lived in current situation?: "7 years" What is atmosphere in current home: Other (Comment) ("calm, peaceful")   Family History:  Marital status: Married Number of Years Married: 26 What types of issues is patient dealing with in the relationship?: "long-term issues we haven't been real close for many years, we live together congenially but pretty much go about life separately" Does patient have children?: Yes How many children?: 2 How is patient's relationship with their children?: "good"   Childhood History:  By whom was/is the patient raised?: Both parents Description of patient's relationship with caregiver when they were a child: "it was a good and caring relationship" Patient's description of current relationship with people who raised him/her: Pt reports that his parents are deceased. How were you disciplined when you got in trouble as a child/adolescent?: "with a  rod" Does patient have siblings?: Yes Number of Siblings: 3 Description of patient's current relationship with siblings: "excellent" Did patient suffer any verbal/emotional/physical/sexual abuse as a child?: No Did patient suffer from severe childhood neglect?: No Has patient ever been sexually abused/assaulted/raped as an adolescent or adult?: No Was the patient ever a victim of a crime or a disaster?: No Witnessed domestic violence?: No Has patient been affected by domestic violence as an adult?: No   Education:  Highest grade of school patient has completed: "Masters in Estate manager/land agent" Currently a Ship broker?: No Learning disability?: No   Employment/Work Situation:   Employment Situation: Retired Chartered loss adjuster is the [employer] Time Patient has Held a Job?: "30 years" 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 If attempted suicide, did drugs/alcohol play a role in this?: No Alcohol/Substance Abuse Treatment Hx: Denies past history Has alcohol/substance abuse ever caused legal problems?: No   Social Support System:   Patient's Community Support System: Good Describe Community Support System: "wife, church people, children" Type of faith/religion: "Producer, television/film/video" How does patient's faith help to cope with current illness?: "very active in church for several years"   Leisure/Recreation:   Do You Have Hobbies?: Yes Leisure and Hobbies: "I operate my radio with morse code, watch TV, YouTube, music, singing in the choir"   Strengths/Needs:   What is the patient's perception of their strengths?: "I enjoy being with people, helping, talk to people" Patient states they can use these personal  strengths during their treatment to contribute  to their recovery: Pt denies. Patient states these barriers may affect/interfere with their treatment: Pt denies.   Discharge Plan:   Currently receiving community mental health services: Yes, Unice Bailey, therapist, [facility], psychiatry Patient states concerns and preferences for aftercare planning are: Pt report that he has a current therapist and is considering getting a new one. Patient states they will know when they are safe and ready for discharge when: "if I was not feeling anxious about my ability to function with the extreme debilitating anxiety" Does patient have access to transportation?: Yes Does patient have financial barriers related to discharge medications?: No Will patient be returning to same living situation after discharge?: Per discharge note from medical floor, pt is recommended to go to [place] with less than 30 days stay. Pt is expected to return home after rehab.   Summary/Recommendations:   Summary and Recommendations (to be completed by the evaluator): Patient is a 81 year old male, married, from [place], [place] [facility] He reports that he receives [employer] retirement and is currently retired. Patient has been readmitted to the [facility] unit from the medical floor, due to fall injury on Geropsych, resulting in hip surgery. Patient was admitted to [facility] unit voluntarily from 5/17 to 6/5 and 6/8 to 6/10. He presents to the hosptial following depressive symptoms and anxious thoughts. Recent stressor include reports of "feeling trapped in the head" and intensive depressive symptoms for 3 months. He has a primary diagnosis of Severe recurrent major depression without psychotic features. Patient has has community mental health therapist and psychiatry aftercare established.  Recommendations include: crisis stabilization, therapeutic milieu, encourage group attendance and participation, medication management for mood  stabilization and development of comprehensive mental wellness plan.  Kyndra Condron A [place]. 04/12/2022

## 2022-04-12 NOTE — BHH Suicide Risk Assessment (Signed)
[place] INPATIENT:  Family/Significant Other Suicide Prevention Education  Suicide Prevention Education:  Contact Attempts: Criss Pallone, [phone] has been identified by the patient as the family member/significant other with whom the patient will be residing, and identified as the person(s) who will aid the patient in the event of a mental health crisis.  With written consent from the patient, two attempts were made to provide suicide prevention education, prior to and/or following the patient's discharge.  We were unsuccessful in providing suicide prevention education.  A suicide education pamphlet was given to the patient to share with family/significant other.  Date and time of first attempt:04/12/22/3:30 PM Date and time of second attempt: 04/15/22: 9:33AM  Jahyra Sukup A [place] 04/12/2022, 3:29 PM

## 2022-04-12 NOTE — H&P (Signed)
Psychiatric Admission Assessment Adult  Patient Identification: John [name] MRN:  [id number] Date of Evaluation:  04/12/2022 Chief Complaint:  Severe recurrent major depression without psychotic features ([place]) [F33.2] Principal Diagnosis: Severe recurrent major depression without psychotic features ([place]) Diagnosis:  Principal Problem:   Severe recurrent major depression without psychotic features ([place]) Active Problems:   Paroxysmal atrial fibrillation (HCC)   Intertrochanteric fracture of right hip ([place])  History of Present Illness: Patient seen and chart reviewed.  Patient known from previous encounters.  81 year old man who had previously been on the geriatric psychiatry ward but was transferred to surgery for hip repair he is now back on the geriatric psychiatry ward.  Hip repair seems to have gone well.  Minimal pain.  Strength improving.  As far as his mood he continues to be depressed and down much of the time.  Mildly hopeless.  Very anxious sometimes without specific things to worry about.  He denies any suicidal thoughts.  He does say that he is troubled by bad dreams at night and having difficulty sleeping. Associated Signs/Symptoms: Depression Symptoms:  depressed mood, insomnia, psychomotor retardation, anxiety, Duration of Depression Symptoms: Greater than two weeks  (Hypo) Manic Symptoms:   None reported Anxiety Symptoms:  Excessive Worry, Psychotic Symptoms:   None evident PTSD Symptoms: Negative Total Time spent with patient: 1 hour  Past Psychiatric History: Patient has a long history of recurrent episodes of depression.  Recently had been on the geriatric psychiatry ward with depression unresponsive to medication.  ECT was being initiated when he had an episode of delirium.  In retrospect I think the most likely cause of it was serotonin syndrome.  That condition seems to have completely resolved.  He had an hip fracture by an accidental fall which has now been repaired.   He had tolerated his 2 ECT treatments well.  Is the patient at risk to self? Yes.    Has the patient been a risk to self in the past 6 months? Yes.    Has the patient been a risk to self within the distant past? Yes.    Is the patient a risk to others? No.  Has the patient been a risk to others in the past 6 months? No.  Has the patient been a risk to others within the distant past? No.   Prior Inpatient Therapy:   Prior Outpatient Therapy:    Alcohol Screening:   Substance Abuse History in the last 12 months:  No. 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 06/2015 CT Head: abnl thickening of mid-dist R cervical ICA w/o narrowing - ? vasculitis and thrombosed/healing dissection; b. 01/2021 Carotid U/S: 1-39% bilat ICA stenoses.   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 Age 60 - struck in head w/ baseball --> s/p craniotomy.   Epidural hematoma (HCC)    H/O Osgood-Schlatter disease    Hand deformity, congenital    Hearing loss bilateral   Hemorrhoids    Horner syndrome    Hypertension    Migraine    Obesity    PONV (postoperative nausea and vomiting)    Prostate cancer (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 Approx 2015 - eval in [place] - conservatively managed.    Past Surgical History:  Procedure Laterality Date   ANKLE ARTHROSCOPY Left    fracture   CHOLECYSTECTOMY  2007   [place]   left frontal craniotomy for epidural hematoma   HIP ARTHROPLASTY Right 04/07/2022   Procedure: ARTHROPLASTY BIPOLAR HIP (HEMIARTHROPLASTY);  Surgeon: Earnestine Leys, MD;  Location: [facility] ORS;  Service: Orthopedics;   Laterality: Right;   INSERTION OF MESH  01/02/2022   Procedure: INSERTION OF MESH;  Surgeon: Herbert Pun, MD;  Location: [facility] ORS;  Service: General;;   LIGAMENT REPAIR Right    thumb   RETINAL DETACHMENT SURGERY     TREATMENT FISTULA ANAL  2006   Family History:  Family History  Problem Relation Age of Onset   Osteoporosis Mother    Depression Mother    Prostate cancer Father    Hypertension Father    Diabetes Brother    Kidney disease Neg Hx    Family Psychiatric  History: See previous.  Depression and anxiety. Tobacco Screening:   Social History:  Social History   Substance and Sexual Activity  Alcohol Use Not Currently     Social History   Substance and Sexual Activity  Drug Use No    Additional Social History:                           Allergies:  No Known Allergies Lab Results:  Results for orders placed or performed during the hospital encounter of 04/11/22 (from the past 48 hour(s))  Glucose, capillary     Status: Abnormal   Collection Time: 04/11/22  5:21 PM  Result Value Ref Range   Glucose-Capillary 120 (H) 70 - 99 mg/dL    Comment: Glucose reference range applies only to samples taken after fasting for at least 8 hours.  Glucose, capillary     Status: Abnormal   Collection Time: 04/11/22  8:40 PM  Result Value Ref Range   Glucose-Capillary 146 (H) 70 - 99 mg/dL    Comment: Glucose reference range applies only to samples taken after fasting for at least 8 hours.  Glucose, capillary     Status: Abnormal   Collection Time: 04/12/22  7:43 AM  Result Value Ref Range   Glucose-Capillary 122 (H) 70 - 99 mg/dL    Comment: Glucose reference range applies only to samples taken after fasting for at least 8 hours.  Glucose, capillary     Status: Abnormal   Collection Time: 04/12/22 12:02 PM  Result Value Ref Range   Glucose-Capillary 120 (H) 70 - 99 mg/dL    Comment: Glucose reference range applies only to samples taken after fasting for at  least 8 hours.  Glucose, capillary     Status: Abnormal   Collection Time: 04/12/22  4:02 PM  Result Value Ref Range   Glucose-Capillary 179 (H) 70 - 99 mg/dL    Comment: Glucose reference range applies only to samples taken after fasting for at least 8 hours.   Comment 1 Notify RN     Blood Alcohol level:  Lab Results  Component Value Date   ETH <10 40/07/2724    Metabolic Disorder Labs:  Lab Results  Component Value Date   HGBA1C 5.6 03/19/2022   MPG 114.02 03/19/2022   No results  found for: "PROLACTIN" Lab Results  Component Value Date   CHOL 131 03/19/2022   TRIG 92 03/19/2022   HDL 33 (L) 03/19/2022   CHOLHDL 4.0 03/19/2022   VLDL 18 03/19/2022   LDLCALC 80 03/19/2022    Current Medications: Current Facility-Administered Medications  Medication Dose Route Frequency Provider Last Rate Last Admin   acetaminophen (TYLENOL) tablet 325-650 mg  325-650 mg Oral Q6H PRN Baruc Tugwell, [name], MD       alum & mag hydroxide-simeth (MAALOX/MYLANTA) 200-200-20 MG/5ML suspension 30 mL  30 mL Oral Q4H PRN Yerlin Gasparyan, [name], MD       aspirin EC tablet 81 mg  81 mg Oral Daily Wen Merced, [name], MD   81 mg at 04/12/22 1015   bisacodyl (DULCOLAX) suppository 10 mg  10 mg Rectal Daily PRN Damyah Gugel, [name], MD       [START ON 04/13/2022] buPROPion (WELLBUTRIN XL) 24 hr tablet 300 mg  300 mg Oral Daily Paraskevi Funez T, MD       docusate sodium (COLACE) capsule 100 mg  100 mg Oral BID Janne Faulk T, MD   100 mg at 04/12/22 1015   ferrous sulfate tablet 325 mg  325 mg Oral Q breakfast Hero Mccathern, [name], MD   325 mg at 04/12/22 0804   HYDROcodone-acetaminophen (NORCO/VICODIN) 5-325 MG per tablet 1-2 tablet  1-2 tablet Oral Q4H PRN Jolonda Gomm, [name], MD   1 tablet at 04/12/22 0238   insulin glargine-yfgn (SEMGLEE) injection 5 Units  5 Units Subcutaneous BID Lyssa Hackley, [name], MD   5 Units at 04/12/22 1016   menthol-cetylpyridinium (CEPACOL) lozenge 3 mg  1 lozenge Oral PRN Margarito Dehaas, [name], MD       Or    phenol (CHLORASEPTIC) mouth spray 1 spray  1 spray Mouth/Throat PRN Macari Zalesky, [name], MD       methocarbamol (ROBAXIN) tablet 500 mg  500 mg Oral Q6H PRN Elch[place]on Maxson, [name], MD       Or   methocarbamol (ROBAXIN) 500 mg in dextrose 5 % 50 mL IVPB  500 mg Intravenous Q6H PRN Kerman Pfost T, MD       metoprolol tartrate (LOPRESSOR) tablet 25 mg  25 mg Oral BID Makinna Andy T, MD   25 mg at 04/12/22 1015   polyethylene glycol (MIRALAX / GLYCOLAX) packet 17 g  17 g Oral Daily Yumiko Alkins T, MD   17 g at 04/12/22 1016   senna (SENOKOT) tablet 8.6 mg  1 tablet Oral BID Jourdyn Ferrin T, MD   8.6 mg at 04/12/22 1016   sodium phosphate (FLEET) 7-19 GM/118ML enema 1 enema  1 enema Rectal Once PRN Husein Guedes, [name], MD       zolpidem (AMBIEN) tablet 5 mg  5 mg Oral QHS PRN Winthrop Shannahan, [name], MD       PTA Medications: Medications Prior to Admission  Medication Sig Dispense Refill Last Dose   alum & mag hydroxide-simeth (MAALOX/MYLANTA) 200-200-20 MG/5ML suspension Take 30 mLs by mouth every 4 (four) hours as needed for indigestion. 355 mL 0    aspirin EC 81 MG tablet Take 81 mg by mouth daily.      buPROPion (WELLBUTRIN XL) 150 MG 24 hr tablet Take 1 tablet (150 mg total) by mouth daily. 30 tablet 0    docusate sodium (COLACE) 100 MG capsule Take 1 capsule (100 mg total) by mouth 2 (two) times daily. 10 capsule 0    ferrous sulfate 325 (65 FE)  MG tablet Take 1 tablet (325 mg total) by mouth daily with breakfast.  3    HYDROcodone-acetaminophen (NORCO/VICODIN) 5-325 MG tablet Take 1-2 tablets by mouth every 4 (four) hours as needed for moderate pain (pain score 4-6). 30 tablet 0    insulin aspart (NOVOLOG) 100 UNIT/ML injection Inject 0-9 Units into the skin 3 (three) times daily with meals. 10 mL 11    insulin glargine-yfgn (SEMGLEE) 100 UNIT/ML injection Inject 0.08 mLs (8 Units total) into the skin 2 (two) times daily. 10 mL 11    LORazepam (ATIVAN) 1 MG tablet Take 1 tablet (1 mg total) by mouth every 4  (four) hours as needed for anxiety, sedation or sleep. 30 tablet 0    methocarbamol (ROBAXIN) 500 MG tablet Take 1 tablet (500 mg total) by mouth every 6 (six) hours as needed for muscle spasms.      metoprolol tartrate (LOPRESSOR) 25 MG tablet Take 1 tablet (25 mg total) by mouth 2 (two) times daily. 60 tablet 0    polyethylene glycol (MIRALAX / GLYCOLAX) 17 g packet Take 17 g by mouth daily as needed. 14 each 0    traZODone (DESYREL) 100 MG tablet Take 1 tablet (100 mg total) by mouth at bedtime as needed for sleep. 30 tablet 0     Musculoskeletal: Strength & Muscle Tone: within normal limits Gait & Station: ataxic Patient leans: N/A            Psychiatric Specialty Exam:  Presentation  General Appearance: Appropriate for Environment  Eye Contact:Good  Speech:Clear and Coherent  Speech Volume:Normal  Handedness:Right   Mood and Affect  Mood:Anxious (stated "a little anxious")  Affect:Congruent   Thought Process  Thought Processes:Linear; Coherent; Goal Directed  Duration of Psychotic Symptoms: No data recorded Past Diagnosis of Schizophrenia or Psychoactive disorder: No  Descriptions of Associations:Intact  Orientation:Full (Time, Place and Person)  Thought Content:Logical; WDL  Hallucinations:No data recorded Ideas of Reference:None  Suicidal Thoughts:No data recorded Homicidal Thoughts:No data recorded  Sensorium  Memory:Immediate Good  Judgment:Good  Insight:Good   Executive Functions  Concentration:Good  Attention Span:Good  Recall:Good  Fund of Knowledge:Good  Language:Good   Psychomotor Activity  Psychomotor Activity:No data recorded  Assets  Assets:Housing; Catering manager; Desire for Improvement; Communication Skills; Resilience; Social Support   Sleep  Sleep:No data recorded   Physical Exam: Physical Exam Vitals and nursing note reviewed.  Constitutional:      Appearance: Normal appearance.  HENT:      Head: Normocephalic and atraumatic.     Mouth/Throat:     Pharynx: Oropharynx is clear.  Eyes:     Pupils: Pupils are equal, round, and reactive to light.  Cardiovascular:     Rate and Rhythm: Normal rate and regular rhythm.  Pulmonary:     Effort: Pulmonary effort is normal.     Breath sounds: Normal breath sounds.  Abdominal:     General: Abdomen is flat.     Palpations: Abdomen is soft.  Musculoskeletal:        General: Normal range of motion.  Skin:    General: Skin is warm and dry.  Neurological:     General: No focal deficit present.     Mental Status: He is alert. Mental status is at baseline.  Psychiatric:        Attention and Perception: Attention normal. He is attentive.        Mood and Affect: Mood is anxious and depressed.  Speech: Speech is delayed.        Behavior: Behavior is cooperative.        Thought Content: Thought content normal.        Cognition and Memory: Cognition normal.        Judgment: Judgment normal.    Review of Systems  Constitutional: Negative.   HENT: Negative.    Eyes: Negative.   Respiratory: Negative.    Cardiovascular: Negative.   Gastrointestinal: Negative.   Musculoskeletal: Negative.   Skin: Negative.   Neurological: Negative.   Psychiatric/Behavioral:  Positive for depression. Negative for hallucinations, substance abuse and suicidal ideas. The patient is nervous/anxious and has insomnia.    Blood pressure 122/84, pulse 70, temperature (!) 97 F (36.1 C), resp. rate 18, height '6\' 2"'$  (1.88 m), weight 87.8 kg, SpO2 99 %. Body mass index is 24.84 kg/m.  Treatment Plan Summary: Daily contact with patient to assess and evaluate symptoms and progress in treatment, Medication management, and Plan patient had been starting ECT treatment and tolerating it well.  His episode of delirium I do not think was related to ECT but was probably serotonin syndrome.  He seems to be recovered from that but is now back to being depressed  again.  I have continued to suggest ECT and we will tentatively plan on that continuing on Monday.  Meanwhile I am increasing the Wellbutrin to 300 mg.  It is possible that that may be contributing to his nightmares.  We will start a very low dose of prazosin and as needed sleep medicine to see if that helps with that.  Encourage group attendance.  Physical therapy will be ordered as well.  Observation Level/Precautions:  15 minute checks  Laboratory:  Chemistry Profile  Psychotherapy:    Medications:    Consultations:    Discharge Concerns:    Estimated LOS:  Other:     Physician Treatment Plan for Primary Diagnosis: Severe recurrent major depression without psychotic features ([place]) Long Term Goal(s): Improvement in symptoms so as ready for discharge  Short Term Goals: Ability to verbalize feelings will improve, Ability to disclose and discuss suicidal ideas, and Ability to demonstrate self-control will improve  Physician Treatment Plan for Secondary Diagnosis: Principal Problem:   Severe recurrent major depression without psychotic features ([place]) Active Problems:   Paroxysmal atrial fibrillation ([place])   Intertrochanteric fracture of right hip ([place])  Long Term Goal(s): Improvement in symptoms so as ready for discharge  Short Term Goals: Ability to identify and develop effective coping behaviors will improve and Ability to maintain clinical measurements within normal limits will improve  I certify that inpatient services furnished can reasonably be expected to improve the patient's condition.    [name], MD 6/16/20234:52 PM

## 2022-04-12 NOTE — Progress Notes (Signed)
Patient remain alert and oriented, with unsteady gait. Continue to ambulate with front wheel walker with one assist. Medicated with PRN Vicodin x1 with good effect. Patient slept through the night. No further complain of pain or discomfort voiced at this time. Patient remain safe on the unit with Q15 minute safety checks.

## 2022-04-12 NOTE — [facility] Suicide Risk Assessment (Signed)
[facility] Admission Suicide Risk Assessment   Nursing information obtained from:    Demographic factors:    Current Mental Status:    Loss Factors:    Historical Factors:    Risk Reduction Factors:     Total Time spent with patient: 1 hour Principal Problem: Severe recurrent major depression without psychotic features ([place]) Diagnosis:  Principal Problem:   Severe recurrent major depression without psychotic features ([place]) Active Problems:   Paroxysmal atrial fibrillation (HCC)   Intertrochanteric fracture of right hip (HCC)  Subjective Data: Patient seen and chart reviewed.  Patient transferred back to the psychiatric unit after repair of hip fracture.  Continues to report depressed mood and anxiety some degree of hopelessness.  Denies suicidal ideation or intent.  Indicates desire to be cooperative with treatment  Continued Clinical Symptoms:    The "Alcohol Use Disorders Identification Test", Guidelines for Use in Primary Care, Second Edition.  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 Score between 0-7:  no or low risk or alcohol related problems. Score between 8-15:  moderate risk of alcohol related problems. Score between 16-19:  high risk of alcohol related problems. Score 20 or above:  warrants further diagnostic evaluation for alcohol dependence and treatment.   CLINICAL FACTORS:   Depression:   Insomnia   Musculoskeletal: Strength & Muscle Tone: decreased Gait & Station:  He just had a hip fracture with replacement and is limping on the right side Patient leans: Right  Psychiatric Specialty Exam:  Presentation  General Appearance: Appropriate for Environment  Eye Contact:Good  Speech:Clear and Coherent  Speech Volume:Normal  Handedness:Right   Mood and Affect  Mood:Anxious (stated "a little anxious")  Affect:Congruent   Thought Process  Thought Processes:Linear; Coherent; Goal Directed  Descriptions of Associations:Intact  Orientation:Full (Time, Place and  Person)  Thought Content:Logical; WDL  History of Schizophrenia/Schizoaffective disorder:No  Duration of Psychotic Symptoms:No data recorded Hallucinations:No data recorded Ideas of Reference:None  Suicidal Thoughts:No data recorded Homicidal Thoughts:No data recorded  Sensorium  Memory:Immediate Good  Judgment:Good  Insight:Good   Executive Functions  Concentration:Good  Attention Span:Good  Recall:Good  Fund of Knowledge:Good  Language:Good   Psychomotor Activity  Psychomotor Activity:No data recorded  Assets  Assets:Housing; Catering manager; Desire for Improvement; Communication Skills; Resilience; Social Support   Sleep  Sleep:No data recorded   Physical Exam: Physical Exam Vitals and nursing note reviewed.  Constitutional:      Appearance: Normal appearance.  HENT:     Head: Normocephalic and atraumatic.     Mouth/Throat:     Pharynx: Oropharynx is clear.  Eyes:     Pupils: Pupils are equal, round, and reactive to light.  Cardiovascular:     Rate and Rhythm: Normal rate and regular rhythm.  Pulmonary:     Effort: Pulmonary effort is normal.     Breath sounds: Normal breath sounds.  Abdominal:     General: Abdomen is flat.     Palpations: Abdomen is soft.  Musculoskeletal:        General: Normal range of motion.     Comments: Limping slightly and walking slowly due to recent repair of hip fracture on the right side  Skin:    General: Skin is warm and dry.  Neurological:     General: No focal deficit present.     Mental Status: He is alert. Mental status is at baseline.  Psychiatric:        Attention and Perception: Attention normal.        Mood and Affect: Mood  is depressed. Affect is blunt.        Speech: Speech is delayed.        Behavior: Behavior is slowed.        Thought Content: Thought content normal. Thought content does not include suicidal ideation.        Cognition and Memory: Cognition normal.        Judgment:  Judgment normal.    Review of Systems  Constitutional: Negative.   HENT: Negative.    Eyes: Negative.   Respiratory: Negative.    Cardiovascular: Negative.   Gastrointestinal: Negative.   Musculoskeletal: Negative.   Skin: Negative.   Neurological: Negative.   Psychiatric/Behavioral:  Positive for depression. Negative for hallucinations, substance abuse and suicidal ideas. The patient is nervous/anxious.    Blood pressure 122/84, pulse 70, temperature (!) 97 F (36.1 C), resp. rate 18, height '6\' 2"'$  (1.88 m), weight 87.8 kg, SpO2 99 %. Body mass index is 24.84 kg/m.   COGNITIVE FEATURES THAT CONTRIBUTE TO RISK:  None    SUICIDE RISK:   Minimal: No identifiable suicidal ideation.  Patients presenting with no risk factors but with morbid ruminations; may be classified as minimal risk based on the severity of the depressive symptoms  PLAN OF CARE: Continue 15-minute checks.  Increase Wellbutrin.  Engage in daily assessment.  Tentative plan for ECT for Monday if he is still depressed and is agreeable  I certify that inpatient services furnished can reasonably be expected to improve the patient's condition.   [name], MD 04/12/2022, 4:50 PM

## 2022-04-12 NOTE — BH IP Treatment Plan (Signed)
Interdisciplinary Treatment and Diagnostic Plan Update  04/12/2022 Time of Session: 10:00AM John [name] MRN: [id number]  Principal Diagnosis: Severe recurrent major depression without psychotic features [facility])  Secondary Diagnoses: Principal Problem:   Severe recurrent major depression without psychotic features ([place])   Current Medications:  Current Facility-Administered Medications  Medication Dose Route Frequency Provider Last Rate Last Admin   acetaminophen (TYLENOL) tablet 325-650 mg  325-650 mg Oral Q6H PRN Clapacs, [name], MD       alum & mag hydroxide-simeth (MAALOX/MYLANTA) 200-200-20 MG/5ML suspension 30 mL  30 mL Oral Q4H PRN Clapacs, [name], MD       aspirin EC tablet 81 mg  81 mg Oral Daily Clapacs, [name], MD   81 mg at 04/12/22 1015   bisacodyl (DULCOLAX) suppository 10 mg  10 mg Rectal Daily PRN Clapacs, [name], MD       buPROPion (WELLBUTRIN XL) 24 hr tablet 150 mg  150 mg Oral Daily Clapacs, John T, MD   150 mg at 04/12/22 1015   docusate sodium (COLACE) capsule 100 mg  100 mg Oral BID Clapacs, John T, MD   100 mg at 04/12/22 1015   ferrous sulfate tablet 325 mg  325 mg Oral Q breakfast Clapacs, [name], MD   325 mg at 04/12/22 0804   HYDROcodone-acetaminophen (NORCO/VICODIN) 5-325 MG per tablet 1-2 tablet  1-2 tablet Oral Q4H PRN Clapacs, [name], MD   1 tablet at 04/12/22 0238   insulin glargine-yfgn (SEMGLEE) injection 5 Units  5 Units Subcutaneous BID Clapacs, [name], MD   5 Units at 04/12/22 1016   menthol-cetylpyridinium (CEPACOL) lozenge 3 mg  1 lozenge Oral PRN Clapacs, [name], MD       Or   phenol (CHLORASEPTIC) mouth spray 1 spray  1 spray Mouth/Throat PRN Clapacs, [name], MD       methocarbamol (ROBAXIN) tablet 500 mg  500 mg Oral Q6H PRN Clapacs, [name], MD       Or   methocarbamol (ROBAXIN) 500 mg in dextrose 5 % 50 mL IVPB  500 mg Intravenous Q6H PRN Clapacs, John T, MD       metoprolol tartrate (LOPRESSOR) tablet 25 mg  25 mg Oral BID Clapacs, John T, MD   25  mg at 04/12/22 1015   polyethylene glycol (MIRALAX / GLYCOLAX) packet 17 g  17 g Oral Daily Clapacs, John T, MD   17 g at 04/12/22 1016   senna (SENOKOT) tablet 8.6 mg  1 tablet Oral BID Clapacs, John T, MD   8.6 mg at 04/12/22 1016   sodium phosphate (FLEET) 7-19 GM/118ML enema 1 enema  1 enema Rectal Once PRN Clapacs, [name], MD       zolpidem (AMBIEN) tablet 5 mg  5 mg Oral QHS PRN Clapacs, [name], MD       PTA Medications: Medications Prior to Admission  Medication Sig Dispense Refill Last Dose   alum & mag hydroxide-simeth (MAALOX/MYLANTA) 200-200-20 MG/5ML suspension Take 30 mLs by mouth every 4 (four) hours as needed for indigestion. 355 mL 0    aspirin EC 81 MG tablet Take 81 mg by mouth daily.      buPROPion (WELLBUTRIN XL) 150 MG 24 hr tablet Take 1 tablet (150 mg total) by mouth daily. 30 tablet 0    docusate sodium (COLACE) 100 MG capsule Take 1 capsule (100 mg total) by mouth 2 (two) times daily. 10 capsule 0    ferrous sulfate 325 (  65 FE) MG tablet Take 1 tablet (325 mg total) by mouth daily with breakfast.  3    HYDROcodone-acetaminophen (NORCO/VICODIN) 5-325 MG tablet Take 1-2 tablets by mouth every 4 (four) hours as needed for moderate pain (pain score 4-6). 30 tablet 0    insulin aspart (NOVOLOG) 100 UNIT/ML injection Inject 0-9 Units into the skin 3 (three) times daily with meals. 10 mL 11    insulin glargine-yfgn (SEMGLEE) 100 UNIT/ML injection Inject 0.08 mLs (8 Units total) into the skin 2 (two) times daily. 10 mL 11    LORazepam (ATIVAN) 1 MG tablet Take 1 tablet (1 mg total) by mouth every 4 (four) hours as needed for anxiety, sedation or sleep. 30 tablet 0    methocarbamol (ROBAXIN) 500 MG tablet Take 1 tablet (500 mg total) by mouth every 6 (six) hours as needed for muscle spasms.      metoprolol tartrate (LOPRESSOR) 25 MG tablet Take 1 tablet (25 mg total) by mouth 2 (two) times daily. 60 tablet 0    polyethylene glycol (MIRALAX / GLYCOLAX) 17 g packet Take 17 g by  mouth daily as needed. 14 each 0    traZODone (DESYREL) 100 MG tablet Take 1 tablet (100 mg total) by mouth at bedtime as needed for sleep. 30 tablet 0     Patient Stressors: Health problems   Loss of full ambulatory function prior to surgery   Medication change or noncompliance    Patient Strengths: Hydrographic surveyor for treatment/growth  Supportive family/friends   Treatment Modalities: Medication Management, Group therapy, Case management,  1 to 1 session with clinician, Psychoeducation, Recreational therapy.   Physician Treatment Plan for Primary Diagnosis: Severe recurrent major depression without psychotic features ([place]) Long Term Goal(s):     Short Term Goals:    Medication Management: Evaluate patient's response, side effects, and tolerance of medication regimen.  Therapeutic Interventions: 1 to 1 sessions, Unit Group sessions and Medication administration.  Evaluation of Outcomes: Not Met  Physician Treatment Plan for Secondary Diagnosis: Principal Problem:   Severe recurrent major depression without psychotic features ([place])  Long Term Goal(s):     Short Term Goals:       Medication Management: Evaluate patient's response, side effects, and tolerance of medication regimen.  Therapeutic Interventions: 1 to 1 sessions, Unit Group sessions and Medication administration.  Evaluation of Outcomes: Not Met   RN Treatment Plan for Primary Diagnosis: Severe recurrent major depression without psychotic features ([place]) Long Term Goal(s): Knowledge of disease and therapeutic regimen to maintain health will improve  Short Term Goals: Ability to remain free from injury will improve, Ability to verbalize frustration and anger appropriately will improve, Ability to demonstrate self-control, Ability to participate in decision making will improve, Ability to verbalize feelings will improve, Ability to identify and develop effective coping behaviors will improve, and  Compliance with prescribed medications will improve  Medication Management: RN will administer medications as ordered by provider, will assess and evaluate patient's response and provide education to patient for prescribed medication. RN will report any adverse and/or side effects to prescribing provider.  Therapeutic Interventions: 1 on 1 counseling sessions, Psychoeducation, Medication administration, Evaluate responses to treatment, Monitor vital signs and CBGs as ordered, Perform/monitor CIWA, COWS, AIMS and Fall Risk screenings as ordered, Perform wound care treatments as ordered.  Evaluation of Outcomes: Not Met   LCSW Treatment Plan for Primary Diagnosis: Severe recurrent major depression without psychotic features ([place]) Long Term Goal(s): Safe transition to appropriate next level  of care at discharge, Engage patient in therapeutic group addressing interpersonal concerns.  Short Term Goals: Engage patient in aftercare planning with referrals and resources, Increase social support, Increase ability to appropriately verbalize feelings, Increase emotional regulation, Facilitate acceptance of mental health diagnosis and concerns, Identify triggers associated with mental health/substance abuse issues, and Increase skills for wellness and recovery  Therapeutic Interventions: Assess for all discharge needs, 1 to 1 time with Social worker, Explore available resources and support systems, Assess for adequacy in community support network, Educate family and significant other(s) on suicide prevention, Complete Psychosocial Assessment, Interpersonal group therapy.  Evaluation of Outcomes: Not Met   Progress in Treatment: Attending groups: No. Participating in groups: No. Taking medication as prescribed: Yes. Toleration medication: Yes. Family/Significant other contact made: No, will contact:  when permission is given Patient understands diagnosis: Yes. Discussing patient identified  problems/goals with staff: Yes. Medical problems stabilized or resolved: Yes. Denies suicidal/homicidal ideation: Yes. Issues/concerns per patient self-inventory: No. Other: None  New problem(s) identified: No, Describe:  None  New Short Term/Long Term Goal(s): Patient to work towards medication management for mood stabilization;  development of comprehensive mental wellness plan.   Patient Goals:  "work on my self-esteem"   Discharge Plan or Barriers: CSW will assist pt with development of appropriate discharge/aftercare plan.   Reason for Continuation of Hospitalization: Anxiety Depression Medication stabilization  Estimated Length of Stay: TBD  Last [place] Suicide Severity Risk Score: [place] Admission (Current) from 04/11/2022 in [place] Admission (Discharged) from 04/06/2022 in [place] (1A) Admission (Discharged) from 04/04/2022 in [place] No Risk No Risk No Risk       Last PHQ 2/9 Scores:    12/21/2021    8:59 AM 04/22/2017   10:46 AM  Depression screen PHQ 2/9  Decreased Interest 0 0  Down, Depressed, Hopeless 0 0  PHQ - 2 Score 0 0    Scribe for Treatment Team: Durward Matranga A [place], Latanya Presser 04/12/2022 10:52 AM

## 2022-04-12 NOTE — Plan of Care (Signed)
Patient presents Flat but Cooperative during assessment. Pt denies SI/HI/AV/H and contracts for safety.  Pt did verbalize 4/10 Anxiety/Depression.  Encouragement and support offered. Pt did have minimal participation on therapeutic unit.  Pt did comply with scheduled medications.  Pt denies pain. Pt did state he and restful sleep.  Pt did have adequate intake and fluids.  Q 15 minute safety checks in place.  Will continue plan of care.   Problem: Education: Goal: Knowledge of General Education information will improve Description: Including pain rating scale, medication(s)/side effects and non-pharmacologic comfort measures Outcome: Progressing   Problem: Health Behavior/Discharge Planning: Goal: Ability to manage health-related needs will improve Outcome: Progressing   Problem: Clinical Measurements: Goal: Will remain free from infection Outcome: Progressing   Problem: Nutrition: Goal: Adequate nutrition will be maintained Outcome: Progressing

## 2022-04-13 DIAGNOSIS — F332 Major depressive disorder, recurrent severe without psychotic features: Secondary | ICD-10-CM | POA: Diagnosis not present

## 2022-04-13 LAB — GLUCOSE, CAPILLARY
Glucose-Capillary: 139 mg/dL — ABNORMAL HIGH (ref 70–99)
Glucose-Capillary: 136 mg/dL — ABNORMAL HIGH (ref 70–99)
Glucose-Capillary: 217 mg/dL — ABNORMAL HIGH (ref 70–99)

## 2022-04-13 NOTE — Progress Notes (Signed)
Patient remain alert, calm and cooperative. Currently sitting in geri chair in his room. Patient had 3 incontinent episode this shift assisted by writer to get clean up and change into clean scrubs. Surgical site dressing remain dry and intact. Remain safe on the unit with Q15 minute safety checks.

## 2022-04-13 NOTE — Group Note (Signed)
LCSW Group Therapy Note  Group Date: 04/13/2022 Start Time: 1300 End Time: 1400   Type of Therapy and Topic:  Group Therapy - Healthy vs Unhealthy Coping Skills  Participation Level:  Did Not Attend   Description of Group The focus of this group was to determine what unhealthy coping techniques typically are used by group members and what healthy coping techniques would be helpful in coping with various problems. Patients were guided in becoming aware of the differences between healthy and unhealthy coping techniques. Patients were asked to identify 2-3 healthy coping skills they would like to learn to use more effectively.  Therapeutic Goals Patients learned that coping is what human beings do all day long to deal with various situations in their lives Patients defined and discussed healthy vs unhealthy coping techniques Patients identified their preferred coping techniques and identified whether these were healthy or unhealthy Patients determined 2-3 healthy coping skills they would like to become more familiar with and use more often. Patients provided support and ideas to each other   Summary of Patient Progress:    Patient did not attend group despite encouraged participation.    Therapeutic Modalities Cognitive Behavioral Therapy Motivational Interviewing  Larose Kells 04/13/2022  2:24 PM

## 2022-04-13 NOTE — Plan of Care (Signed)
Ate the beginning of the shift patient observed in the day room with visitor by his side talking. After visiting hour patient ambulate from the day room to his with front wheel walker. Patient assisted to sit in geri chair. During assessment patient denies SI, HI, and AVH. Endorsed anxiety and depression. Compliant with all due medications and tolerated well. Denies pain or discomfort at this time. Dressing remain dry and intact to right hip surgical site area remain bruised. Patient observed ambulating without front wheel walker and had to be reminded to use his walker when ambulating. And to call for help before ambulating. Patient is currently in bed resting in no apparent distress at this time. Remain safe on the unit with Q15 minute safety checks. Problem: Education: Goal: Knowledge of General Education information will improve Description: Including pain rating scale, medication(s)/side effects and non-pharmacologic comfort measures Outcome: Progressing   Problem: Health Behavior/Discharge Planning: Goal: Ability to manage health-related needs will improve Outcome: Progressing   Problem: Clinical Measurements: Goal: Ability to maintain clinical measurements within normal limits will improve Outcome: Progressing Goal: Will remain free from infection Outcome: Progressing Goal: Diagnostic test results will improve Outcome: Progressing Goal: Respiratory complications will improve Outcome: Progressing Goal: Cardiovascular complication will be avoided Outcome: Progressing   Problem: Activity: Goal: Risk for activity intolerance will decrease Outcome: Progressing   Problem: Nutrition: Goal: Adequate nutrition will be maintained Outcome: Progressing   Problem: Coping: Goal: Level of anxiety will decrease Outcome: Progressing   Problem: Elimination: Goal: Will not experience complications related to bowel motility Outcome: Progressing Goal: Will not experience complications related  to urinary retention Outcome: Progressing   Problem: Pain Managment: Goal: General experience of comfort will improve Outcome: Progressing   Problem: Safety: Goal: Ability to remain free from injury will improve Outcome: Progressing   Problem: Skin Integrity: Goal: Risk for impaired skin integrity will decrease Outcome: Progressing   Problem: Education: Goal: Knowledge of [place] [facility] Education information/materials will improve Outcome: Progressing Goal: Emotional status will improve Outcome: Progressing Goal: Mental status will improve Outcome: Progressing Goal: Verbalization of understanding the information provided will improve Outcome: Progressing   Problem: Activity: Goal: Interest or engagement in activities will improve Outcome: Progressing Goal: Sleeping patterns will improve Outcome: Progressing   Problem: Coping: Goal: Ability to verbalize frustrations and anger appropriately will improve Outcome: Progressing Goal: Ability to demonstrate self-control will improve Outcome: Progressing   Problem: Health Behavior/Discharge Planning: Goal: Identification of resources available to assist in meeting health care needs will improve Outcome: Progressing Goal: Compliance with treatment plan for underlying cause of condition will improve Outcome: Progressing   Problem: Physical Regulation: Goal: Ability to maintain clinical measurements within normal limits will improve Outcome: Progressing   Problem: Safety: Goal: Periods of time without injury will increase Outcome: Progressing   Problem: Education: Goal: Utilization of techniques to improve thought processes will improve Outcome: Progressing Goal: Knowledge of the prescribed therapeutic regimen will improve Outcome: Progressing   Problem: Activity: Goal: Interest or engagement in leisure activities will improve Outcome: Progressing Goal: Imbalance in normal sleep/wake cycle will improve Outcome:  Progressing   Problem: Health Behavior/Discharge Planning: Goal: Ability to make decisions will improve Outcome: Progressing Goal: Compliance with therapeutic regimen will improve Outcome: Progressing   Problem: Safety: Goal: Ability to disclose and discuss suicidal ideas will improve Outcome: Progressing Goal: Ability to identify and utilize support systems that promote safety will improve Outcome: Progressing   Problem: Self-Concept: Goal: Will verbalize positive feelings about self Outcome: Progressing  Goal: Level of anxiety will decrease Outcome: Progressing

## 2022-04-13 NOTE — Progress Notes (Signed)
[facility] MD Progress Note  04/13/2022 10:05 PM John [name]  MRN:  [id number] Subjective:  Pt seen and chart reviewed.  John [name] is calm and cooperative, and worked without Physical Therapy team.  Agreed to ask for assist when John [name] needs to ambulate.  Sitter requested per PT recommendation.  Otherwise, doing ok, fair moods. No SI.   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 06/2015 CT Head: abnl thickening of mid-dist R cervical ICA w/o narrowing - ? vasculitis and thrombosed/healing dissection; b. 01/2021 Carotid U/S: 1-39% bilat ICA stenoses.   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 Age 30 - struck in head w/ baseball --> s/p craniotomy.   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 Approx 2015 - eval in [place] - conservatively managed.    Past Surgical History:  Procedure Laterality Date   ANKLE ARTHROSCOPY Left    fracture   CHOLECYSTECTOMY  2007   [place]   left frontal craniotomy for epidural hematoma   HIP ARTHROPLASTY Right 04/07/2022   Procedure: ARTHROPLASTY BIPOLAR HIP (HEMIARTHROPLASTY);  Surgeon: Earnestine Leys, MD;   Location: [facility] ORS;  Service: Orthopedics;  Laterality: Right;   INSERTION OF MESH  01/02/2022   Procedure: INSERTION OF MESH;  Surgeon: Herbert Pun, MD;  Location: [facility] ORS;  Service: General;;   LIGAMENT REPAIR Right    thumb   RETINAL DETACHMENT SURGERY     TREATMENT FISTULA ANAL  2006   Family History:  Family History  Problem Relation Age of Onset   Osteoporosis Mother    Depression Mother    Prostate cancer Father    Hypertension Father    Diabetes Brother    Kidney disease Neg Hx    Family Psychiatric  History: Patient has a long history of recurrent episodes of depression.  Recently had been on the geriatric psychiatry ward with depression unresponsive to medication.  ECT was being initiated when John [name] had an episode of delirium.  In retrospect I think the most likely cause of it was serotonin syndrome.  That condition seems to have completely resolved.  John [name] had an hip fracture by an accidental fall which has now been repaired.  John [name] had tolerated his 2 ECT treatments well. Social History:  Social History   Substance and Sexual Activity  Alcohol Use Not Currently     Social History   Substance and Sexual Activity  Drug Use No    Social History   Socioeconomic History   Marital status:  Married    Spouse name: [name]   Number of children: 2   Years of education: Not on file   Highest education level: Not on file  Occupational History   Not on file  Tobacco Use   Smoking status: Never   Smokeless tobacco: Never  Vaping Use   Vaping Use: Never used  Substance and Sexual Activity   Alcohol use: Not Currently   Drug use: No   Sexual activity: Yes    Birth control/protection: None  Other Topics Concern   Not on file  Social History Narrative   Live at home with wife.    Social Determinants of Health   Financial Resource Strain: Not on file  Food Insecurity: Not on file  Transportation Needs: Not on file  Physical Activity: Not on file  Stress: Not on  file  Social Connections: Not on file   Additional Social History:    Sleep: Fair  Appetite:  Fair  Current Medications: Current Facility-Administered Medications  Medication Dose Route Frequency Provider Last Rate Last Admin   acetaminophen (TYLENOL) tablet 325-650 mg  325-650 mg Oral Q6H PRN Clapacs, John T, MD       alum & mag hydroxide-simeth (MAALOX/MYLANTA) 200-200-20 MG/5ML suspension 30 mL  30 mL Oral Q4H PRN Clapacs, [name], MD       aspirin EC tablet 81 mg  81 mg Oral Daily Clapacs, [name], MD   81 mg at 04/13/22 0912   bisacodyl (DULCOLAX) suppository 10 mg  10 mg Rectal Daily PRN Clapacs, John T, MD       buPROPion (WELLBUTRIN XL) 24 hr tablet 300 mg  300 mg Oral Daily Clapacs, John T, MD   300 mg at 04/13/22 0912   docusate sodium (COLACE) capsule 100 mg  100 mg Oral BID Clapacs, John T, MD   100 mg at 04/12/22 2150   ferrous sulfate tablet 325 mg  325 mg Oral Q breakfast Clapacs, [name], MD   325 mg at 04/13/22 0817   HYDROcodone-acetaminophen (NORCO/VICODIN) 5-325 MG per tablet 1-2 tablet  1-2 tablet Oral Q4H PRN Clapacs, [name], MD   1 tablet at 04/12/22 0238   insulin glargine-yfgn (SEMGLEE) injection 5 Units  5 Units Subcutaneous BID Clapacs, [name], MD   5 Units at 04/13/22 2139   menthol-cetylpyridinium (CEPACOL) lozenge 3 mg  1 lozenge Oral PRN Clapacs, [name], MD       Or   phenol (CHLORASEPTIC) mouth spray 1 spray  1 spray Mouth/Throat PRN Clapacs, [name], MD       methocarbamol (ROBAXIN) tablet 500 mg  500 mg Oral Q6H PRN Clapacs, [name], MD       Or   methocarbamol (ROBAXIN) 500 mg in dextrose 5 % 50 mL IVPB  500 mg Intravenous Q6H PRN Clapacs, John T, MD       metoprolol tartrate (LOPRESSOR) tablet 25 mg  25 mg Oral BID Clapacs, John T, MD   25 mg at 04/13/22 2141   polyethylene glycol (MIRALAX / GLYCOLAX) packet 17 g  17 g Oral Daily Clapacs, John T, MD   17 g at 04/12/22 1016   prazosin (MINIPRESS) capsule 1 mg  1 mg Oral QHS Clapacs, John T, MD   1 mg at  04/13/22 2141   senna (SENOKOT) tablet 8.6 mg  1 tablet Oral BID Clapacs, John T, MD   8.6 mg at 04/12/22 2150   sodium phosphate (FLEET) 7-19 GM/118ML enema 1 enema  1 enema Rectal Once PRN Clapacs, [name], MD       traZODone (DESYREL) tablet 50 mg  50 mg Oral QHS PRN Clapacs, [name], MD       zolpidem (AMBIEN) tablet 5 mg  5 mg Oral QHS PRN Clapacs, [name], MD        Lab Results:  Results for orders placed or performed during the hospital encounter of 04/11/22 (from the past 48 hour(s))  Glucose, capillary     Status: Abnormal   Collection Time: 04/12/22  7:43 AM  Result Value Ref Range   Glucose-Capillary 122 (H) 70 - 99 mg/dL    Comment: Glucose reference range applies only to samples taken after fasting for at least 8 hours.  Glucose, capillary     Status: Abnormal   Collection Time: 04/12/22 12:02 PM  Result Value Ref Range   Glucose-Capillary 120 (H) 70 - 99 mg/dL    Comment: Glucose reference range applies only to samples taken after fasting for at least 8 hours.  Glucose, capillary     Status: Abnormal   Collection Time: 04/12/22  4:02 PM  Result Value Ref Range   Glucose-Capillary 179 (H) 70 - 99 mg/dL    Comment: Glucose reference range applies only to samples taken after fasting for at least 8 hours.   Comment 1 Notify RN   Glucose, capillary     Status: Abnormal   Collection Time: 04/12/22  7:55 PM  Result Value Ref Range   Glucose-Capillary 163 (H) 70 - 99 mg/dL    Comment: Glucose reference range applies only to samples taken after fasting for at least 8 hours.  Glucose, capillary     Status: Abnormal   Collection Time: 04/13/22  8:05 AM  Result Value Ref Range   Glucose-Capillary 139 (H) 70 - 99 mg/dL    Comment: Glucose reference range applies only to samples taken after fasting for at least 8 hours.  Glucose, capillary     Status: Abnormal   Collection Time: 04/13/22  4:10 PM  Result Value Ref Range   Glucose-Capillary 136 (H) 70 - 99 mg/dL    Comment: Glucose  reference range applies only to samples taken after fasting for at least 8 hours.  Glucose, capillary     Status: Abnormal   Collection Time: 04/13/22  9:38 PM  Result Value Ref Range   Glucose-Capillary 217 (H) 70 - 99 mg/dL    Comment: Glucose reference range applies only to samples taken after fasting for at least 8 hours.    Blood Alcohol level:  Lab Results  Component Value Date   ETH <10 12/87/8676    Metabolic Disorder Labs: Lab Results  Component Value Date   HGBA1C 5.6 03/19/2022   MPG 114.02 03/19/2022   No results found for: "PROLACTIN" Lab Results  Component Value Date   CHOL 131 03/19/2022   TRIG 92 03/19/2022   HDL 33 (L) 03/19/2022   CHOLHDL 4.0 03/19/2022   VLDL 18 03/19/2022   LDLCALC 80 03/19/2022    Physical Findings: AIMS: Facial and Oral Movements Muscles of Facial Expression: None, normal Lips and Perioral Area: None, normal Jaw: None, normal Tongue: None, normal,Extremity Movements Upper (arms, wrists, hands, fingers): None, normal Lower (legs, knees, ankles, toes): Moderate (Rt. leg & hip pain. S/P Rt. hip surgery), Trunk Movements Neck, shoulders, hips: None, normal, Overall Severity Severity of abnormal movements (highest score from questions above): Moderate (R/T to Rt. hip surgery) Incapacitation due to abnormal movements: None,  normal Patient's awareness of abnormal movements (rate only patient's report): No Awareness, Dental Status Current problems with teeth and/or dentures?: No Does patient usually wear dentures?: No  CIWA:    COWS:     Musculoskeletal: Strength & Muscle Tone: abnormal Gait & Station: ataxic Patient leans: N/A  Psychiatric Specialty Exam:  Presentation  General Appearance: Appropriate for Environment  Eye Contact:Good  Speech:Clear and Coherent  Speech Volume:Normal  Handedness:Right   Mood and Affect  Mood:Anxious (stated "a little anxious")  Affect:Congruent   Thought Process  Thought  Processes:Linear; Coherent; Goal Directed  Descriptions of Associations:Intact  Orientation:Full (Time, Place and Person)  Thought Content:Logical; WDL  History of Schizophrenia/Schizoaffective disorder:No  Duration of Psychotic Symptoms:No data recorded Hallucinations:No data recorded Ideas of Reference:None  Suicidal Thoughts:No data recorded Homicidal Thoughts:No data recorded  Sensorium  Memory:Immediate Good  Judgment:Good  Insight:Good   Executive Functions  Concentration:Good  Attention Span:Good  Recall:Good  Fund of Knowledge:Good  Language:Good   Psychomotor Activity  Psychomotor Activity:No data recorded  Assets  Assets:Housing; Catering manager; Desire for Improvement; Communication Skills; Resilience; Social Support   Sleep  Sleep:No data recorded   Physical Exam: Physical Exam Psychiatric:        Attention and Perception: Attention normal.        Mood and Affect: Mood is anxious and depressed.        Speech: Speech normal.        Behavior: Behavior is cooperative.        Thought Content: Thought content normal.    Review of Systems  Psychiatric/Behavioral:  Positive for depression.    Blood pressure 138/81, pulse 64, temperature 98.1 F (36.7 C), resp. rate 18, height '6\' 2"'$  (1.88 m), weight 87.8 kg, SpO2 99 %. Body mass index is 24.84 kg/m.   Treatment Plan Summary: Daily contact with patient to assess and evaluate symptoms and progress in treatment and Medication management  MDD, severe , recurrent -- continue ECT -- continue wellbutrin XL '300mg'$   -- continue Prazosine '1mg'$  qhs.   Delirium, resloved  Medically:   -- continue to work with PT -- order 1:1 sitter for fall risks per PT recommendation.    Starsky Nanna, MD 04/13/2022, 10:05 PM

## 2022-04-13 NOTE — Progress Notes (Signed)
Pt sitting on edge of bed; calm, cooperative. Pt states "I feel fairly well." He c/o hip pain, which he describes as "sore" and rates 3/10; however, he declines pain medication. He currently denies SI/HI/AVH. He reports that he has been sleeping "fairly well" and describes his appetite as "fair". He expresses anxiety about his treatment progress and depression regarding his "body and mental faculties". No acute distress noted.

## 2022-04-13 NOTE — Evaluation (Signed)
Physical Therapy Evaluation Patient Details Name: John [name] MRN: [id number] DOB: 1941-06-23 Today's Date: 04/13/2022  History of Present Illness  Pt is a 81 yo male with PMH of depression, anxiety, HTN, DM, OSA, prostate cancer, Horner syndrome, dissection of carotid artery, subdural hematoma, epidural hematoma. Previously hospitalized from 6/5-6/8 for severe depression and found to have abnormal EKG with A-fib. Pt receiving ECT for management of depression. Pt admitted 6/10 due to R femoral neck fx due to a fall in the  bathroom on the evening of 6/9. Pt underwent R hip hemiarthroplasty on 6/11. Pt discharged from inpatient unit and now admitted to [facility] psych unit. Evaluation performed 04/13/22 to resume therapy services.  Clinical Impression  Pt is a pleasant 81 year old male who was admitted for MDD and underwent R hip hemi on 6/11. Needs post hip precautions/PWB still requires cues for sequencing. Pt performs bed mobility with min assist, transfers with cga, and ambulation with cga and RW. Pt demonstrates deficits with strength/mobility/balance/endurance. Would benefit from skilled PT to address above deficits and promote optimal return to PLOF. Recommend to continue working with patient to progress therapy potential.      Recommendations for follow up therapy are one component of a multi-disciplinary discharge planning process, led by the attending physician.  Recommendations may be updated based on patient status, additional functional criteria and insurance authorization.  Follow Up Recommendations Follow physician's recommendations for discharge plan and follow up therapies    Assistance Recommended at Discharge Frequent or constant Supervision/Assistance  Patient can return home with the following  A little help with walking and/or transfers;A little help with bathing/dressing/bathroom    Equipment Recommendations Rolling walker (2 wheels);BSC/3in1  Recommendations for Other Services        Functional Status Assessment Patient has had a recent decline in their functional status and demonstrates the ability to make significant improvements in function in a reasonable and predictable amount of time.     Precautions / Restrictions Precautions Precautions: Posterior Hip;Fall Precaution Booklet Issued: No Restrictions Weight Bearing Restrictions: Yes RLE Weight Bearing: Partial weight bearing RLE Partial Weight Bearing Percentage or Pounds: 50      Mobility  Bed Mobility Overal bed mobility: Needs Assistance Bed Mobility: Supine to Sit     Supine to sit: Min assist     General bed mobility comments: needs assist for maintaining hip precautions throughout transition to EOB. Once seated, upright posture noted    Transfers Overall transfer level: Needs assistance Equipment used: Rolling walker (2 wheels) Transfers: Sit to/from Stand Sit to Stand: Min guard           General transfer comment: cues for sequencing and hand placement. Upright posture.    Ambulation/Gait Ambulation/Gait assistance: Min guard Gait Distance (Feet): 30 Feet Assistive device: Rolling walker (2 wheels) Gait Pattern/deviations: Step-to pattern       General Gait Details: cues for sequencing and WBing status. Has trouble initiating movements with physical assist to propel RW and cues for safe distancing.  Stairs            Wheelchair Mobility    Modified Rankin (Stroke Patients Only)       Balance Overall balance assessment: Needs assistance Sitting-balance support: Feet supported, No upper extremity supported Sitting balance-Leahy Scale: Good     Standing balance support: Reliant on assistive device for balance, Bilateral upper extremity supported, Single extremity supported  Pertinent Vitals/Pain Pain Assessment Pain Assessment: No/denies pain    Home Living Family/patient expects to be discharged to:: Private  residence Living Arrangements: Spouse/significant other Available Help at Discharge: Family Type of Home: House Home Access: Stairs to enter Entrance Stairs-Rails: Can reach both Entrance Stairs-Number of Steps: 5   Home Layout: One level Home Equipment: None      Prior Function Prior Level of Function : Independent/Modified Independent             Mobility Comments: Pt previously ambulated without AD. ADLs Comments: Independent with bathing, dressing, feeding, driving     Hand Dominance   Dominant Hand: Right    Extremity/Trunk Assessment   Upper Extremity Assessment Upper Extremity Assessment: Overall WFL for tasks assessed    Lower Extremity Assessment Lower Extremity Assessment: Generalized weakness (R LE grossly 3/5; L LE grossly 5/5)       Communication   Communication: No difficulties  Cognition Arousal/Alertness: Awake/alert Behavior During Therapy: WFL for tasks assessed/performed, Flat affect Overall Cognitive Status: Within Functional Limits for tasks assessed                                 General Comments: alert and oriented, however tends to get easily distracted and low carry over with cues.        General Comments      Exercises Other Exercises Other Exercises: seated ther-ex performed including LAQ and LAQ + hip ab. Cues for sequencing. ~10 reps with cga and cues for sequencing Other Exercises: ambulated to bathroom with cga and cues for sequencing. Heavy cues for transfer to [facility]. Needs max assist for donning/doffing clothes Other Exercises: pt educated on post hip precautions and WBing status   Assessment/Plan    PT Assessment Patient needs continued PT services  PT Problem List Decreased strength;Decreased activity tolerance;Decreased balance;Decreased mobility;Decreased knowledge of use of DME;Decreased safety awareness       PT Treatment Interventions Gait training;Therapeutic activities;Therapeutic exercise;DME  instruction;Stair training;Patient/family education    PT Goals (Current goals can be found in the Care Plan section)  Acute Rehab PT Goals Patient Stated Goal: to go home PT Goal Formulation: With patient Time For Goal Achievement: 04/27/22 Potential to Achieve Goals: Good    Frequency 7X/week     Co-evaluation               AM-PAC PT "6 Clicks" Mobility  Outcome Measure Help needed turning from your back to your side while in a flat bed without using bedrails?: A Little Help needed moving from lying on your back to sitting on the side of a flat bed without using bedrails?: A Little Help needed moving to and from a bed to a chair (including a wheelchair)?: A Little Help needed standing up from a chair using your arms (e.g., wheelchair or bedside chair)?: A Little Help needed to walk in hospital room?: A Little Help needed climbing 3-5 steps with a railing? : A Lot 6 Click Score: 17    End of Session Equipment Utilized During Treatment: Gait belt Activity Tolerance: Patient tolerated treatment well Patient left: in chair (educated and pt demonstrated on how to call RN for assistance) Nurse Communication: Mobility status PT Visit Diagnosis: Muscle weakness (generalized) (M62.81);Difficulty in walking, not elsewhere classified (R26.2);Unsteadiness on feet (R26.81) Pain - Right/Left: Right Pain - part of body: Hip    Time: 1017-5102 PT Time Calculation (min) (ACUTE ONLY): 31 min  Charges:   PT Evaluation $PT Eval Low Complexity: 1 Low PT Treatments $Therapeutic Activity: 8-22 mins        [name], PT, DPT, G[facility] [phone]   Jayshawn Colston 04/13/2022, 12:42 PM

## 2022-04-14 DIAGNOSIS — F332 Major depressive disorder, recurrent severe without psychotic features: Secondary | ICD-10-CM | POA: Diagnosis not present

## 2022-04-14 LAB — GLUCOSE, CAPILLARY
Glucose-Capillary: 108 mg/dL — ABNORMAL HIGH (ref 70–99)
Glucose-Capillary: 110 mg/dL — ABNORMAL HIGH (ref 70–99)
Glucose-Capillary: 113 mg/dL — ABNORMAL HIGH (ref 70–99)
Glucose-Capillary: 132 mg/dL — ABNORMAL HIGH (ref 70–99)

## 2022-04-14 NOTE — Progress Notes (Signed)
Patient Flat but Cooperative.  Pt denies SI/HI/AV/H and contracts for safety. 1:1 safety attendant at bedside.  Pt did verbalize 4/10 Anxiety and Depression.  Pt did comply with scheduled medications.  Pt denies pain.  Q 15 minute safety check in place.  Will continue plan of care.

## 2022-04-14 NOTE — Progress Notes (Signed)
[facility] MD Progress Note  04/14/2022 12:32 PM John [name]  MRN:  [id number] Subjective:  Pt seen and chart reviewed.  John [name] is calm and cooperative,  with sitter for fall risks.  John [name] reports feeing better and doing well. No SI or Hi.   Sitter reports that pt has been well, and has been trying to do as much as John [name] can on his own for his ADLs.    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 06/2015 CT Head: abnl thickening of mid-dist R cervical ICA w/o narrowing - ? vasculitis and thrombosed/healing dissection; b. 01/2021 Carotid U/S: 1-39% bilat ICA stenoses.   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 Age 62 - struck in head w/ baseball --> s/p craniotomy.   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 Approx 2015 - eval in [place] - conservatively managed.    Past Surgical History:  Procedure Laterality Date   ANKLE ARTHROSCOPY Left    fracture   CHOLECYSTECTOMY  2007   [place]   left frontal craniotomy for epidural hematoma   HIP ARTHROPLASTY Right 04/07/2022   Procedure: ARTHROPLASTY BIPOLAR HIP (HEMIARTHROPLASTY);  Surgeon:  Earnestine Leys, MD;  Location: [facility] ORS;  Service: Orthopedics;  Laterality: Right;   INSERTION OF MESH  01/02/2022   Procedure: INSERTION OF MESH;  Surgeon: Herbert Pun, MD;  Location: [facility] ORS;  Service: General;;   LIGAMENT REPAIR Right    thumb   RETINAL DETACHMENT SURGERY     TREATMENT FISTULA ANAL  2006   Family History:  Family History  Problem Relation Age of Onset   Osteoporosis Mother    Depression Mother    Prostate cancer Father    Hypertension Father    Diabetes Brother    Kidney disease Neg Hx    Family Psychiatric  History: Patient has a long history of recurrent episodes of depression.  Recently had been on the geriatric psychiatry ward with depression unresponsive to medication.  ECT was being initiated when John [name] had an episode of delirium.  In retrospect I think the most likely cause of it was serotonin syndrome.  That condition seems to have completely resolved.  John [name] had an hip fracture by an accidental fall which has now been repaired.  John [name] had tolerated his 2 ECT treatments well. Social History:  Social History   Substance and Sexual Activity  Alcohol Use Not Currently     Social History   Substance and Sexual Activity  Drug Use  No    Social History   Socioeconomic History   Marital status: Married    Spouse name: [name]   Number of children: 2   Years of education: Not on file   Highest education level: Not on file  Occupational History   Not on file  Tobacco Use   Smoking status: Never   Smokeless tobacco: Never  Vaping Use   Vaping Use: Never used  Substance and Sexual Activity   Alcohol use: Not Currently   Drug use: No   Sexual activity: Yes    Birth control/protection: None  Other Topics Concern   Not on file  Social History Narrative   Live at home with wife.    Social Determinants of Health   Financial Resource Strain: Not on file  Food Insecurity: Not on file  Transportation Needs: Not on file  Physical Activity: Not on file   Stress: Not on file  Social Connections: Not on file   Additional Social History:    Sleep: Fair  Appetite:  Fair  Current Medications: Current Facility-Administered Medications  Medication Dose Route Frequency Provider Last Rate Last Admin   acetaminophen (TYLENOL) tablet 325-650 mg  325-650 mg Oral Q6H PRN Clapacs, John T, MD       alum & mag hydroxide-simeth (MAALOX/MYLANTA) 200-200-20 MG/5ML suspension 30 mL  30 mL Oral Q4H PRN Clapacs, [name], MD       aspirin EC tablet 81 mg  81 mg Oral Daily Clapacs, John T, MD   81 mg at 04/14/22 0944   bisacodyl (DULCOLAX) suppository 10 mg  10 mg Rectal Daily PRN Clapacs, John T, MD       buPROPion (WELLBUTRIN XL) 24 hr tablet 300 mg  300 mg Oral Daily Clapacs, John T, MD   300 mg at 04/14/22 0945   docusate sodium (COLACE) capsule 100 mg  100 mg Oral BID Clapacs, John T, MD   100 mg at 04/14/22 4098   ferrous sulfate tablet 325 mg  325 mg Oral Q breakfast Clapacs, John T, MD   325 mg at 04/14/22 1191   HYDROcodone-acetaminophen (NORCO/VICODIN) 5-325 MG per tablet 1-2 tablet  1-2 tablet Oral Q4H PRN Clapacs, [name], MD   1 tablet at 04/12/22 0238   insulin glargine-yfgn (SEMGLEE) injection 5 Units  5 Units Subcutaneous BID Clapacs, [name], MD   5 Units at 04/14/22 0951   menthol-cetylpyridinium (CEPACOL) lozenge 3 mg  1 lozenge Oral PRN Clapacs, [name], MD       Or   phenol (CHLORASEPTIC) mouth spray 1 spray  1 spray Mouth/Throat PRN Clapacs, [name], MD       methocarbamol (ROBAXIN) tablet 500 mg  500 mg Oral Q6H PRN Clapacs, [name], MD       Or   methocarbamol (ROBAXIN) 500 mg in dextrose 5 % 50 mL IVPB  500 mg Intravenous Q6H PRN Clapacs, John T, MD       metoprolol tartrate (LOPRESSOR) tablet 25 mg  25 mg Oral BID Clapacs, John T, MD   25 mg at 04/14/22 0944   polyethylene glycol (MIRALAX / GLYCOLAX) packet 17 g  17 g Oral Daily Clapacs, John T, MD   17 g at 04/12/22 1016   prazosin (MINIPRESS) capsule 1 mg  1 mg Oral QHS Clapacs, John T,  MD   1 mg at 04/13/22 2141   senna (SENOKOT) tablet 8.6 mg  1 tablet Oral BID Clapacs, [name], MD   8.6 mg  at 04/12/22 2150   sodium phosphate (FLEET) 7-19 GM/118ML enema 1 enema  1 enema Rectal Once PRN Clapacs, [name], MD       traZODone (DESYREL) tablet 50 mg  50 mg Oral QHS PRN Clapacs, [name], MD       zolpidem (AMBIEN) tablet 5 mg  5 mg Oral QHS PRN Clapacs, [name], MD        Lab Results:  Results for orders placed or performed during the hospital encounter of 04/11/22 (from the past 48 hour(s))  Glucose, capillary     Status: Abnormal   Collection Time: 04/12/22  4:02 PM  Result Value Ref Range   Glucose-Capillary 179 (H) 70 - 99 mg/dL    Comment: Glucose reference range applies only to samples taken after fasting for at least 8 hours.   Comment 1 Notify RN   Glucose, capillary     Status: Abnormal   Collection Time: 04/12/22  7:55 PM  Result Value Ref Range   Glucose-Capillary 163 (H) 70 - 99 mg/dL    Comment: Glucose reference range applies only to samples taken after fasting for at least 8 hours.  Glucose, capillary     Status: Abnormal   Collection Time: 04/13/22  8:05 AM  Result Value Ref Range   Glucose-Capillary 139 (H) 70 - 99 mg/dL    Comment: Glucose reference range applies only to samples taken after fasting for at least 8 hours.  Glucose, capillary     Status: Abnormal   Collection Time: 04/13/22  4:10 PM  Result Value Ref Range   Glucose-Capillary 136 (H) 70 - 99 mg/dL    Comment: Glucose reference range applies only to samples taken after fasting for at least 8 hours.  Glucose, capillary     Status: Abnormal   Collection Time: 04/13/22  9:38 PM  Result Value Ref Range   Glucose-Capillary 217 (H) 70 - 99 mg/dL    Comment: Glucose reference range applies only to samples taken after fasting for at least 8 hours.  Glucose, capillary     Status: Abnormal   Collection Time: 04/14/22  8:12 AM  Result Value Ref Range   Glucose-Capillary 108 (H) 70 - 99 mg/dL     Comment: Glucose reference range applies only to samples taken after fasting for at least 8 hours.  Glucose, capillary     Status: Abnormal   Collection Time: 04/14/22 11:53 AM  Result Value Ref Range   Glucose-Capillary 110 (H) 70 - 99 mg/dL    Comment: Glucose reference range applies only to samples taken after fasting for at least 8 hours.    Blood Alcohol level:  Lab Results  Component Value Date   ETH <10 71/03/2693    Metabolic Disorder Labs: Lab Results  Component Value Date   HGBA1C 5.6 03/19/2022   MPG 114.02 03/19/2022   No results found for: "PROLACTIN" Lab Results  Component Value Date   CHOL 131 03/19/2022   TRIG 92 03/19/2022   HDL 33 (L) 03/19/2022   CHOLHDL 4.0 03/19/2022   VLDL 18 03/19/2022   LDLCALC 80 03/19/2022    Physical Findings: AIMS: Facial and Oral Movements Muscles of Facial Expression: None, normal Lips and Perioral Area: None, normal Jaw: None, normal Tongue: None, normal,Extremity Movements Upper (arms, wrists, hands, fingers): None, normal Lower (legs, knees, ankles, toes): Moderate (Rt. leg & hip pain. S/P Rt. hip surgery), Trunk Movements Neck, shoulders, hips: None, normal, Overall Severity Severity of abnormal movements (highest score from  questions above): Moderate (R/T to Rt. hip surgery) Incapacitation due to abnormal movements: None, normal Patient's awareness of abnormal movements (rate only patient's report): No Awareness, Dental Status Current problems with teeth and/or dentures?: No Does patient usually wear dentures?: No  CIWA:    COWS:     Musculoskeletal: Strength & Muscle Tone: abnormal Gait & Station: ataxic Patient leans: N/A  Psychiatric Specialty Exam:  Presentation  General Appearance: Appropriate for Environment  Eye Contact:Good  Speech:Clear and Coherent  Speech Volume:Normal  Handedness:Right   Mood and Affect  Mood:Anxious (stated "a little anxious")  Affect:Congruent   Thought Process   Thought Processes:Linear; Coherent; Goal Directed  Descriptions of Associations:Intact  Orientation:Full (Time, Place and Person)  Thought Content:Logical; WDL  History of Schizophrenia/Schizoaffective disorder:No  Duration of Psychotic Symptoms:No data recorded Hallucinations:No data recorded Ideas of Reference:None  Suicidal Thoughts:No data recorded Homicidal Thoughts:No data recorded  Sensorium  Memory:Immediate Good  Judgment:Good  Insight:Good   Executive Functions  Concentration:Good  Attention Span:Good  Recall:Good  Fund of Knowledge:Good  Language:Good   Psychomotor Activity  Psychomotor Activity:No data recorded  Assets  Assets:Housing; Catering manager; Desire for Improvement; Communication Skills; Resilience; Social Support   Sleep  Sleep:No data recorded   Physical Exam: Physical Exam Psychiatric:        Attention and Perception: Attention normal.        Mood and Affect: Mood is anxious and depressed.        Speech: Speech normal.        Behavior: Behavior is cooperative.        Thought Content: Thought content normal.    Review of Systems  Psychiatric/Behavioral:  Positive for depression.    Blood pressure 129/75, pulse 61, temperature 97.9 F (36.6 C), resp. rate 18, height '6\' 2"'$  (1.88 m), weight 87.8 kg, SpO2 100 %. Body mass index is 24.84 kg/m.   Treatment Plan Summary: Daily contact with patient to assess and evaluate symptoms and progress in treatment and Medication management  MDD, severe , recurrent -- continue ECT -- continue wellbutrin XL '300mg'$   -- continue Prazosine '1mg'$  qhs.   Delirium, resloved  Medically:   -- continue to work with PT -- order 1:1 sitter for fall risks per PT recommendation.  -- will need post-op team to check his surgical incision, defer to primary team.    Amar Keenum, MD 04/14/2022, 12:32 PM

## 2022-04-14 NOTE — Progress Notes (Signed)
Physical Therapy Treatment Patient Details Name: John [name] MRN: [id number] DOB: May 07, 1941 Today's Date: 04/14/2022   History of Present Illness Pt is a 81 yo male with PMH of depression, anxiety, HTN, DM, OSA, prostate cancer, Horner syndrome, dissection of carotid artery, subdural hematoma, epidural hematoma. Previously hospitalized from 6/5-6/8 for severe depression and found to have abnormal EKG with A-fib. Pt receiving ECT for management of depression. Pt admitted 6/10 due to R femoral neck fx due to a fall in the  bathroom on the evening of 6/9. Pt underwent R hip hemiarthroplasty on 6/11. Pt discharged from inpatient unit and now admitted to [facility] psych unit. Evaluation performed 04/13/22 to resume therapy services.    PT Comments    Pt progressing toward PT goals. Pt was able to recite all precautions at start of session. Pt ambulated 120 feet today with RW and CGA, and required verbal cueing for technique with RW and maintaining R hip precautions. Pt then completed 360 degree turn in his room to practice turning right with RW without breaking hip IR precaution. Heavy verbal cues required to maintain hip in neutral/ER. Follow physician's recommendations for discharge plan.   Recommendations for follow up therapy are one component of a multi-disciplinary discharge planning process, led by the attending physician.  Recommendations may be updated based on patient status, additional functional criteria and insurance authorization.  Follow Up Recommendations  Follow physician's recommendations for discharge plan and follow up therapies     Assistance Recommended at Discharge Frequent or constant Supervision/Assistance  Patient can return home with the following A little help with walking and/or transfers;A little help with bathing/dressing/bathroom   Equipment Recommendations  Rolling walker (2 wheels);BSC/3in1    Recommendations for Other Services       Precautions / Restrictions  Precautions Precautions: Posterior Hip;Fall Precaution Booklet Issued: No Restrictions Weight Bearing Restrictions: Yes RLE Weight Bearing: Partial weight bearing RLE Partial Weight Bearing Percentage or Pounds: 50     Mobility  Bed Mobility Overal bed mobility: Needs Assistance Bed Mobility: Supine to Sit     Supine to sit: Supervision     General bed mobility comments: pt sat up EOB upon SPT entering room; increased time and effort noted for moving R LE; Supervision for maintaining precautions.    Transfers Overall transfer level: Needs assistance Equipment used: Rolling walker (2 wheels) Transfers: Sit to/from Stand Sit to Stand: Min guard           General transfer comment: cues for sequencing sit-to-stand and hand placement after reviewing hip precautions    Ambulation/Gait Ambulation/Gait assistance: Min guard Gait Distance (Feet): 120 Feet Assistive device: Rolling walker (2 wheels) Gait Pattern/deviations: Step-through pattern, Narrow base of support, Decreased weight shift to right, Decreased stance time - right Gait velocity: decreased.     General Gait Details: verbal cues required for sequencing with RW; cues to put some pressure on R LE without breaking 50% PWB precautions to avoid pts R LE slipping due to lack of WB for safety   Stairs             Wheelchair Mobility    Modified Rankin (Stroke Patients Only)       Balance Overall balance assessment: Needs assistance Sitting-balance support: Feet supported, No upper extremity supported Sitting balance-Leahy Scale: Good Sitting balance - Comments: Sat EOB back unsupported, no UE support and feet on floor. Reaching for RW without LOB   Standing balance support: Reliant on assistive device for balance, Bilateral upper extremity supported,  Single extremity supported Standing balance-Leahy Scale: Fair Standing balance comment: Pt able to stand with 1 UE supported on RW and 1 UE putting on  glasses. No LOB.                            Cognition Arousal/Alertness: Awake/alert Behavior During Therapy: WFL for tasks assessed/performed, Flat affect Overall Cognitive Status: Within Functional Limits for tasks assessed                                 General Comments: decreased carryover with cues during mobility tasks        Exercises Total Joint Exercises Long Arc Quad: AROM, Strengthening, Right, 10 reps, Seated, Other (comment) (LAQs with hip ABD motion added) Other Exercises Other Exercises: Pt requested to practice 360 degree turn to the R with RW and CGA; heavy verbal cues required to maintain hip rotation precautions.    General Comments        Pertinent Vitals/Pain Pain Assessment Pain Assessment: 0-10 Pain Score: 2  Pain Location: R hip Pain Descriptors / Indicators: Operative site guarding, Discomfort Pain Intervention(s): Monitored during session, Repositioned, Limited activity within patient's tolerance    Home Living                          Prior Function            PT Goals (current goals can now be found in the care plan section) Acute Rehab PT Goals Patient Stated Goal: to go home PT Goal Formulation: With patient Time For Goal Achievement: 04/27/22 Potential to Achieve Goals: Good Progress towards PT goals: Progressing toward goals    Frequency    7X/week      PT Plan Current plan remains appropriate    Co-evaluation              AM-PAC PT "6 Clicks" Mobility   Outcome Measure  Help needed turning from your back to your side while in a flat bed without using bedrails?: A Little Help needed moving from lying on your back to sitting on the side of a flat bed without using bedrails?: A Little Help needed moving to and from a bed to a chair (including a wheelchair)?: A Little Help needed standing up from a chair using your arms (e.g., wheelchair or bedside chair)?: A Little Help needed to  walk in hospital room?: A Little Help needed climbing 3-5 steps with a railing? : A Lot 6 Click Score: 17    End of Session Equipment Utilized During Treatment: Gait belt Activity Tolerance: Patient tolerated treatment well Patient left: in bed;with nursing/sitter in room (seated EOB with sitter present in room) Nurse Communication: Mobility status PT Visit Diagnosis: Muscle weakness (generalized) (M62.81);Difficulty in walking, not elsewhere classified (R26.2);Unsteadiness on feet (R26.81) Pain - Right/Left: Right Pain - part of body: Hip     Time: 4098-1191 PT Time Calculation (min) (ACUTE ONLY): 24 min  Charges:  $Gait Training: 23-37 mins                     Jaedyn Lard, SPT  Jayveion Stalling 04/14/2022, 12:49 PM

## 2022-04-14 NOTE — Evaluation (Signed)
Occupational Therapy Evaluation Patient Details Name: John [name] MRN: [id number] DOB: 1941-04-17 Today's Date: 04/14/2022   History of Present Illness Pt is a 81 yo male with PMH of depression, anxiety, HTN, DM, OSA, prostate cancer, Horner syndrome, dissection of carotid artery, subdural hematoma, epidural hematoma. Previously hospitalized from 6/5-6/8 for severe depression and found to have abnormal EKG with A-fib. Pt receiving ECT for management of depression. Pt admitted 6/10 due to R femoral neck fx due to a fall in the  bathroom on the evening of 6/9. Pt underwent R hip hemiarthroplasty on 6/11. Pt discharged from inpatient unit and now admitted to [facility] psych unit. Evaluation performed 04/14/22 to resume OT services.   Clinical Impression   John [name] presents with generalized weakness, decreased strength and ROM in R hip, mild pain in R hip, fatigue, and low mood. He continues to make progress in fxl mobility following a hip fx on 04/05/2022 and now reports pain as 2/10 and intermittent. He is improving in his ability to safely use RW and to adhere consistently to posterior hip and weight-bearing precautions. He continues to report his mood as "down" and has difficulty with making decisions, with speech often slightly delayed and clipped. John [name] scored 12/28 on the [place]ister "Insomnia Severity Index," in which a score of 8-14 indicates "near the threshold for clinical insomnia." On the Patient Health Questionnaire (PHQ-9), which is scored on a scale of 0 (no depression) to 27 (highest depression), John [name] scored 17/27, falling into the "high depression" severity range. One the General Anxiety Disorder (GAD-7), which is scored from 0 (no anxiety) to 21 (highest level of anxiety), John [name] scored 14/21, which falls into the "medium anxiety" severity range. Asked if he had found anything that lessened his depression, pt reports that he had a "dramatic increase in mood" immediately  following his first ECT treatment and that he is hopeful subsequent ECT sessions will have a similar effect. We will continue to provide OT while pt is hospitalized, recommend DC to home and with ongoing outpatient mental health therapy to support improved mood, engagement, and decreased anxiety, insomnia, and depression.    Recommendations for follow up therapy are one component of a multi-disciplinary discharge planning process, led by the attending physician.  Recommendations may be updated based on patient status, additional functional criteria and insurance authorization.   Follow Up Recommendations  Other (comment) (outpt mental health services)    Assistance Recommended at Discharge Intermittent Supervision/Assistance  Patient can return home with the following A little help with walking and/or transfers;A little help with bathing/dressing/bathroom;Assistance with cooking/housework;Assist for transportation;Help with stairs or ramp for entrance    Functional Status Assessment  Patient has had a recent decline in their functional status and demonstrates the ability to make significant improvements in function in a reasonable and predictable amount of time.  Equipment Recommendations  Other (comment) (RW)    Recommendations for Other Services       Precautions / Restrictions Precautions Precautions: Posterior Hip;Fall Precaution Booklet Issued: No Restrictions Weight Bearing Restrictions: Yes RLE Weight Bearing: Partial weight bearing RLE Partial Weight Bearing Percentage or Pounds: 50      Mobility Bed Mobility Overal bed mobility: Needs Assistance             General bed mobility comments: received, left in recliners. Pt reports he is now able to perform all bed mobility w/o physical assistance    Transfers Overall transfer level: Needs assistance Equipment used: Rolling walker (2  wheels) Transfers: Sit to/from Stand Sit to Stand: Supervision                   Balance Overall balance assessment: Needs assistance Sitting-balance support: Feet supported, No upper extremity supported Sitting balance-Leahy Scale: Good     Standing balance support: Reliant on assistive device for balance, Bilateral upper extremity supported, Single extremity supported Standing balance-Leahy Scale: Fair Standing balance comment: No LOB while ambulating with RW and adhering to WB precautions; good safety awarenss                           ADL either performed or assessed with clinical judgement   ADL                                               Vision         Perception     Praxis      Pertinent Vitals/Pain Pain Assessment Pain Assessment: No/denies pain Pain Score: 1  Pain Location: R hip Pain Descriptors / Indicators: Discomfort Pain Intervention(s): Repositioned, Monitored during session     Hand Dominance Right   Extremity/Trunk Assessment Upper Extremity Assessment Upper Extremity Assessment: Overall WFL for tasks assessed   Lower Extremity Assessment Lower Extremity Assessment: Generalized weakness;RLE deficits/detail RLE Deficits / Details: decreased pain, improved strength and ROM in R hip each day RLE Sensation: WNL   Cervical / Trunk Assessment Cervical / Trunk Assessment: Normal   Communication Communication Communication: No difficulties   Cognition Arousal/Alertness: Awake/alert Behavior During Therapy: WFL for tasks assessed/performed, Flat affect Overall Cognitive Status: Within Functional Limits for tasks assessed                                       General Comments       Exercises Other Exercises Other Exercises: Reinforcement of safe use of DME, WB precautions.   Shoulder Instructions      Home Living Family/patient expects to be discharged to:: Private residence Living Arrangements: Spouse/significant other Available Help at Discharge: Family Type of  Home: House Home Access: Stairs to enter Technical brewer of Steps: 5 Entrance Stairs-Rails: Can reach both Home Layout: One level     Bathroom Shower/Tub: Teacher, early years/pre: Standard     Home Equipment: None          Prior Functioning/Environment Prior Level of Function : Independent/Modified Independent             Mobility Comments: Pt previously ambulated without AD. ADLs Comments: IND in ADL, IADL prior to hip fx        OT Problem List: Decreased strength;Decreased range of motion;Decreased activity tolerance;Impaired balance (sitting and/or standing);Pain;Other (comment) (depression, anxiety, insomnia)      OT Treatment/Interventions:      OT Goals(Current goals can be found in the care plan section) Acute Rehab OT Goals Patient Stated Goal: to have improved mood OT Goal Formulation: With patient Time For Goal Achievement: 04/28/22 Potential to Achieve Goals: Good ADL Goals Pt Will Perform Tub/Shower Transfer: Shower transfer;with modified independence;shower seat;Stand pivot transfer;rolling walker (while adhering to WB precautions) Pt/caregiver will Perform Home Exercise Program: Independently (as a tool for managing depression, anxiety, and insomnia) Additional ADL Goal #1:  pt will be able to ID activities that he enjoys doing and will engage in 1+ each day.  OT Frequency:      Co-evaluation              AM-PAC OT "6 Clicks" Daily Activity     Outcome Measure Help from another person eating meals?: None Help from another person taking care of personal grooming?: A Little Help from another person toileting, which includes using toliet, bedpan, or urinal?: A Little Help from another person bathing (including washing, rinsing, drying)?: A Little Help from another person to put on and taking off regular upper body clothing?: A Little Help from another person to put on and taking off regular lower body clothing?: A Little 6 Click  Score: 19   End of Session Equipment Utilized During Treatment: Rolling walker (2 wheels)  Activity Tolerance: Patient tolerated treatment well Patient left: in chair;with nursing/sitter in room  OT Visit Diagnosis: Unsteadiness on feet (R26.81);Muscle weakness (generalized) (M62.81)                Time: 7026-3785 OT Time Calculation (min): 26 min Charges:  OT General Charges $OT Visit: 1 Visit OT Evaluation $OT Eval Moderate Complexity: 1 Mod OT Treatments $Self Care/Home Management : 23-37 mins Josiah Lobo, PhD, MS, OTR/L 04/14/22, 3:34 PM

## 2022-04-14 NOTE — Plan of Care (Signed)

## 2022-04-15 DIAGNOSIS — F332 Major depressive disorder, recurrent severe without psychotic features: Secondary | ICD-10-CM | POA: Diagnosis not present

## 2022-04-15 LAB — GLUCOSE, CAPILLARY
Glucose-Capillary: 126 mg/dL — ABNORMAL HIGH (ref 70–99)
Glucose-Capillary: 187 mg/dL — ABNORMAL HIGH (ref 70–99)
Glucose-Capillary: 235 mg/dL — ABNORMAL HIGH (ref 70–99)

## 2022-04-15 NOTE — Consult Note (Signed)
[name]   Reason for Consult: Follow-up ECT consult 81 year old man with severe depression Referring Physician: Louis [name] Patient Identification: John [name] MRN:  [id number] Principal Diagnosis: Severe recurrent major depression without psychotic features ([place]) Diagnosis:  Principal Problem:   Severe recurrent major depression without psychotic features ([place]) Active Problems:   Paroxysmal atrial fibrillation ([place])   Intertrochanteric fracture of right hip ([place])   Total Time spent with patient: 20 minutes  [name] is a 81 y.o. male patient admitted with "not so good".  HPI: Patient had been receiving ECT but there have been several interruptions from medical problems.  Reviewed with patient that he remains very depressed and negative down very slowed with suicidal ideation although no intent or plan.  No clear psychosis although mental slowing is very apparent.  Patient continues to be agreeable to ECT treatment  Past Psychiatric History: Longstanding episodes of chronic recurrent depression  Risk to Self:   Risk to Others:   Prior Inpatient Therapy:   Prior Outpatient Therapy:    Past Medical History:  Past Medical History:  Diagnosis Date   Cancer ([place])    Depression    Depression    Diabetes mellitus without complication ([place])    type 2   Dissection of carotid artery ([place])    a. 06/2015 CT Head: abnl thickening of mid-dist R cervical ICA w/o narrowing - ? vasculitis and thrombosed/healing dissection; b. 01/2021 Carotid U/S: 1-39% bilat ICA stenoses.   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 Age 26 - struck in head w/ baseball --> s/p craniotomy.   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 Approx 2015 - eval in [place] - conservatively managed.    Past Surgical History:  Procedure Laterality Date   ANKLE ARTHROSCOPY Left    fracture   CHOLECYSTECTOMY  2007   [place]   left frontal craniotomy for epidural hematoma   HIP ARTHROPLASTY Right 04/07/2022   Procedure: ARTHROPLASTY BIPOLAR HIP (HEMIARTHROPLASTY);  Surgeon: Earnestine Leys, MD;  Location: [facility] ORS;  Service: Orthopedics;  Laterality: Right;   INSERTION OF MESH  01/02/2022   Procedure: INSERTION OF MESH;  Surgeon: Herbert Pun, MD;  Location: [facility] ORS;  Service: General;;   LIGAMENT REPAIR Right    thumb   RETINAL DETACHMENT SURGERY     TREATMENT FISTULA ANAL  2006   Family History:  Family History  Problem Relation Age of Onset   Osteoporosis Mother    Depression Mother    Prostate cancer Father    Hypertension Father    Diabetes Brother    Kidney disease Neg Hx    Family Psychiatric  History: See previous Social History:  Social History   Substance and Sexual Activity  Alcohol Use Not Currently     Social History   Substance and Sexual Activity  Drug Use No    Social History   Socioeconomic History   Marital status: Married    Spouse name: [name]   Number of  children: 2   Years of education: Not on file   Highest education level: Not on file  Occupational History   Not on file  Tobacco Use   Smoking status: Never   Smokeless tobacco: Never  Vaping Use   Vaping Use: Never used  Substance and Sexual Activity   Alcohol use: Not Currently   Drug use: No   Sexual activity: Yes    Birth control/protection: None  Other Topics Concern   Not on file  Social History Narrative   Live at home with wife.    Social Determinants of Health   Financial Resource Strain: Not on file  Food Insecurity: Not on file  Transportation Needs: Not on file  Physical Activity: Not on file   Stress: Not on file  Social Connections: Not on file   Additional Social History:    Allergies:  No Known Allergies  Labs:  Results for orders placed or performed during the hospital encounter of 04/11/22 (from the past 48 hour(s))  Glucose, capillary     Status: Abnormal   Collection Time: 04/13/22  4:10 PM  Result Value Ref Range   Glucose-Capillary 136 (H) 70 - 99 mg/dL    Comment: Glucose reference range applies only to samples taken after fasting for at least 8 hours.  Glucose, capillary     Status: Abnormal   Collection Time: 04/13/22  9:38 PM  Result Value Ref Range   Glucose-Capillary 217 (H) 70 - 99 mg/dL    Comment: Glucose reference range applies only to samples taken after fasting for at least 8 hours.  Glucose, capillary     Status: Abnormal   Collection Time: 04/14/22  8:12 AM  Result Value Ref Range   Glucose-Capillary 108 (H) 70 - 99 mg/dL    Comment: Glucose reference range applies only to samples taken after fasting for at least 8 hours.  Glucose, capillary     Status: Abnormal   Collection Time: 04/14/22 11:53 AM  Result Value Ref Range   Glucose-Capillary 110 (H) 70 - 99 mg/dL    Comment: Glucose reference range applies only to samples taken after fasting for at least 8 hours.  Glucose, capillary     Status: Abnormal   Collection Time: 04/14/22  4:45 PM  Result Value Ref Range   Glucose-Capillary 113 (H) 70 - 99 mg/dL    Comment: Glucose reference range applies only to samples taken after fasting for at least 8 hours.   Comment 1 Notify RN   Glucose, capillary     Status: Abnormal   Collection Time: 04/14/22  8:18 PM  Result Value Ref Range   Glucose-Capillary 132 (H) 70 - 99 mg/dL    Comment: Glucose reference range applies only to samples taken after fasting for at least 8 hours.  Glucose, capillary     Status: Abnormal   Collection Time: 04/15/22  7:23 AM  Result Value Ref Range   Glucose-Capillary 126 (H) 70 - 99 mg/dL    Comment: Glucose  reference range applies only to samples taken after fasting for at least 8 hours.    Current Facility-Administered Medications  Medication Dose Route Frequency Provider Last Rate Last Admin   acetaminophen (TYLENOL) tablet 325-650 mg  325-650 mg Oral Q6H PRN Darinda [place] T, MD       alum & mag hydroxide-simeth (MAALOX/MYLANTA) 200-200-20 MG/5ML suspension 30 mL  30 mL Oral Q4H PRN Curtis Cain, [name], MD       aspirin EC tablet 81 mg  81 mg Oral Daily Baila Rouse, [name], MD   81 mg at 04/15/22 0932   bisacodyl (DULCOLAX) suppository 10 mg  10 mg Rectal Daily PRN Tylah Mancillas, [name], MD       buPROPion (WELLBUTRIN XL) 24 hr tablet 300 mg  300 mg Oral Daily Alastor Kneale,  T, MD   300 mg at 04/15/22 0932   docusate sodium (COLACE) capsule 100 mg  100 mg Oral BID Nisreen Guise,  T, MD   100 mg at 04/15/22 7824   ferrous sulfate tablet 325 mg  325 mg Oral Q breakfast Arvon Schreiner, [name], MD   325 mg at 04/15/22 0817   HYDROcodone-acetaminophen (NORCO/VICODIN) 5-325 MG per tablet 1-2 tablet  1-2 tablet Oral Q4H PRN Massie Cogliano, [name], MD   1 tablet at 04/12/22 0238   insulin glargine-yfgn (SEMGLEE) injection 5 Units  5 Units Subcutaneous BID Shephanie Romas, [name], MD   5 Units at 04/15/22 0930   menthol-cetylpyridinium (CEPACOL) lozenge 3 mg  1 lozenge Oral PRN Chelsea Nusz, [name], MD       Or   phenol (CHLORASEPTIC) mouth spray 1 spray  1 spray Mouth/Throat PRN Chavela Justiniano, [name], MD       methocarbamol (ROBAXIN) tablet 500 mg  500 mg Oral Q6H PRN [place] Dani, [name], MD       Or   methocarbamol (ROBAXIN) 500 mg in dextrose 5 % 50 mL IVPB  500 mg Intravenous Q6H PRN Joachim Carton T, MD       metoprolol tartrate (LOPRESSOR) tablet 25 mg  25 mg Oral BID Syrianna Schillaci T, MD   25 mg at 04/15/22 0933   polyethylene glycol (MIRALAX / GLYCOLAX) packet 17 g  17 g Oral Daily Carnel Stegman T, MD   17 g at 04/12/22 1016   prazosin (MINIPRESS) capsule 1 mg  1 mg Oral QHS Wendi Lastra T, MD   1 mg at 04/14/22 2102   senna (SENOKOT) tablet 8.6 mg   1 tablet Oral BID Wenda Vanschaick T, MD   8.6 mg at 04/15/22 0933   sodium phosphate (FLEET) 7-19 GM/118ML enema 1 enema  1 enema Rectal Once PRN Malayshia All, [name], MD       traZODone (DESYREL) tablet 50 mg  50 mg Oral QHS PRN , [name], MD   50 mg at 04/14/22 2102   zolpidem (AMBIEN) tablet 5 mg  5 mg Oral QHS PRN , [name], MD        Musculoskeletal: Strength & Muscle Tone: within normal limits Gait & Station: normal Patient leans: N/A            Psychiatric Specialty Exam:  Presentation  General Appearance: Appropriate for Environment  Eye Contact:Good  Speech:Clear and Coherent  Speech Volume:Normal  Handedness:Right   Mood and Affect  Mood:Anxious (stated "a little anxious")  Affect:Congruent   Thought Process  Thought Processes:Linear; Coherent; Goal Directed  Descriptions of Associations:Intact  Orientation:Full (Time, Place and Person)  Thought Content:Logical; WDL  History of Schizophrenia/Schizoaffective disorder:No  Duration of Psychotic Symptoms:No data recorded Hallucinations:No data recorded Ideas of Reference:None  Suicidal Thoughts:No data recorded Homicidal Thoughts:No data recorded  Sensorium  Memory:Immediate Good  Judgment:Good  Insight:Good   Executive Functions  Concentration:Good  Attention Span:Good  Recall:Good  Fund of Knowledge:Good  Language:Good   Psychomotor Activity  Psychomotor Activity:No data recorded  Assets  Assets:Housing; Catering manager; Desire for Improvement; Communication Skills; Resilience; Social Support   Sleep  Sleep:No data recorded  Physical Exam: Physical Exam Vitals  and nursing note reviewed.  Constitutional:      Appearance: Normal appearance.  HENT:     Head: Normocephalic and atraumatic.     Mouth/Throat:     Pharynx: Oropharynx is clear.  Eyes:     Pupils: Pupils are equal, round, and reactive to light.  Cardiovascular:     Rate and Rhythm:  Normal rate and regular rhythm.  Pulmonary:     Effort: Pulmonary effort is normal.     Breath sounds: Normal breath sounds.  Abdominal:     General: Abdomen is flat.     Palpations: Abdomen is soft.  Musculoskeletal:        General: Normal range of motion.  Skin:    General: Skin is warm and dry.  Neurological:     General: No focal deficit present.     Mental Status: He is alert. Mental status is at baseline.  Psychiatric:        Attention and Perception: He is inattentive.        Mood and Affect: Mood is depressed. Affect is blunt.        Speech: Speech is delayed.        Behavior: Behavior is slowed.        Thought Content: Thought content normal.    Review of Systems  Constitutional: Negative.   HENT: Negative.    Eyes: Negative.   Respiratory: Negative.    Cardiovascular: Negative.   Gastrointestinal: Negative.   Musculoskeletal: Negative.   Skin: Negative.   Neurological: Negative.   Psychiatric/Behavioral:  Positive for depression. The patient is nervous/anxious.    Blood pressure (!) 165/78, pulse 61, temperature (!) 97.5 F (36.4 C), resp. rate 20, height '6\' 2"'$  (1.88 m), weight 87.8 kg, SpO2 99 %. Body mass index is 24.84 kg/m.  Treatment Plan Summary: Plan no change to medication.  ECT is scheduled for Wednesday.  Patient agrees to plan  Disposition:  See above  [name], MD 04/15/2022 2:39 PM

## 2022-04-15 NOTE — Progress Notes (Signed)
[facility] MD Progress Note  04/15/2022 1:59 PM John [name]  MRN:  [id number] Subjective: John [name] unfortunately broke his hip while I was on vacation.  This is my first meeting with him since my return.  He is doing well with Wellbutrin and did well with ECT but also had some cardiac issues.  Still depressed and anxious but his affect is not as bad as it was before I left.  Dr. Weber Cooks tells me that he is going to resume ECT so I told Hulan that I will not make any changes at this point in time.  Principal Problem: Severe recurrent major depression without psychotic features ([place]) Diagnosis: Principal Problem:   Severe recurrent major depression without psychotic features ([place]) Active Problems:   Paroxysmal atrial fibrillation (HCC)   Intertrochanteric fracture of right hip ([place])  Total Time spent with patient: 15 minutes  Past Psychiatric History:  He describes numerous psychiatric hospitalizations for depression and anxiety between 1978 and 1990.  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 06/2015 CT Head: abnl thickening of mid-dist R cervical ICA w/o narrowing - ? vasculitis and thrombosed/healing dissection; b. 01/2021 Carotid U/S: 1-39% bilat ICA stenoses.   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 Age 26 - struck in head w/ baseball --> s/p craniotomy.   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 Approx 2015 - eval in [place] - conservatively managed.    Past Surgical History:  Procedure  Laterality Date   ANKLE ARTHROSCOPY Left    fracture   CHOLECYSTECTOMY  2007   [place]   left frontal craniotomy for epidural hematoma   HIP ARTHROPLASTY Right 04/07/2022   Procedure: ARTHROPLASTY BIPOLAR HIP (HEMIARTHROPLASTY);  Surgeon: Earnestine Leys, MD;  Location: [facility] ORS;  Service: Orthopedics;  Laterality: Right;   INSERTION OF MESH  01/02/2022   Procedure: INSERTION OF MESH;  Surgeon: Herbert Pun, MD;  Location: [facility] ORS;  Service: General;;   LIGAMENT REPAIR Right    thumb   RETINAL DETACHMENT SURGERY     TREATMENT FISTULA ANAL  2006   Family History:  Family History  Problem Relation Age of Onset   Osteoporosis Mother    Depression Mother    Prostate cancer Father    Hypertension Father    Diabetes Brother    Kidney disease Neg Hx     Social History:  Social History   Substance and Sexual Activity  Alcohol Use Not Currently     Social History   Substance and Sexual Activity  Drug Use No    Social History   Socioeconomic History   Marital status: Married    Spouse name: [name]   Number of children: 2   Years of education: Not on file   Highest education level: Not on file  Occupational  History   Not on file  Tobacco Use   Smoking status: Never   Smokeless tobacco: Never  Vaping Use   Vaping Use: Never used  Substance and Sexual Activity   Alcohol use: Not Currently   Drug use: No   Sexual activity: Yes    Birth control/protection: None  Other Topics Concern   Not on file  Social History Narrative   Live at home with wife.    Social Determinants of Health   Financial Resource Strain: Not on file  Food Insecurity: Not on file  Transportation Needs: Not on file  Physical Activity: Not on file  Stress: Not on file  Social Connections: Not on file   Additional Social History:                         Sleep: Fair  Appetite:  Good  Current Medications: Current Facility-Administered Medications  Medication Dose  Route Frequency Provider Last Rate Last Admin   acetaminophen (TYLENOL) tablet 325-650 mg  325-650 mg Oral Q6H PRN Clapacs, John T, MD       alum & mag hydroxide-simeth (MAALOX/MYLANTA) 200-200-20 MG/5ML suspension 30 mL  30 mL Oral Q4H PRN Clapacs, [name], MD       aspirin EC tablet 81 mg  81 mg Oral Daily Clapacs, [name], MD   81 mg at 04/15/22 0932   bisacodyl (DULCOLAX) suppository 10 mg  10 mg Rectal Daily PRN Clapacs, John T, MD       buPROPion (WELLBUTRIN XL) 24 hr tablet 300 mg  300 mg Oral Daily Clapacs, John T, MD   300 mg at 04/15/22 0932   docusate sodium (COLACE) capsule 100 mg  100 mg Oral BID Clapacs, John T, MD   100 mg at 04/15/22 9379   ferrous sulfate tablet 325 mg  325 mg Oral Q breakfast Clapacs, John T, MD   325 mg at 04/15/22 0817   HYDROcodone-acetaminophen (NORCO/VICODIN) 5-325 MG per tablet 1-2 tablet  1-2 tablet Oral Q4H PRN Clapacs, [name], MD   1 tablet at 04/12/22 0238   insulin glargine-yfgn (SEMGLEE) injection 5 Units  5 Units Subcutaneous BID Clapacs, [name], MD   5 Units at 04/15/22 0930   menthol-cetylpyridinium (CEPACOL) lozenge 3 mg  1 lozenge Oral PRN Clapacs, [name], MD       Or   phenol (CHLORASEPTIC) mouth spray 1 spray  1 spray Mouth/Throat PRN Clapacs, [name], MD       methocarbamol (ROBAXIN) tablet 500 mg  500 mg Oral Q6H PRN Clapacs, [name], MD       Or   methocarbamol (ROBAXIN) 500 mg in dextrose 5 % 50 mL IVPB  500 mg Intravenous Q6H PRN Clapacs, John T, MD       metoprolol tartrate (LOPRESSOR) tablet 25 mg  25 mg Oral BID Clapacs, John T, MD   25 mg at 04/15/22 0933   polyethylene glycol (MIRALAX / GLYCOLAX) packet 17 g  17 g Oral Daily Clapacs, John T, MD   17 g at 04/12/22 1016   prazosin (MINIPRESS) capsule 1 mg  1 mg Oral QHS Clapacs, John T, MD   1 mg at 04/14/22 2102   senna (SENOKOT) tablet 8.6 mg  1 tablet Oral BID Clapacs, John T, MD   8.6 mg at 04/15/22 0933   sodium phosphate (FLEET) 7-19 GM/118ML enema 1 enema  1 enema Rectal Once PRN  Clapacs, [name], MD  traZODone (DESYREL) tablet 50 mg  50 mg Oral QHS PRN Clapacs, [name], MD   50 mg at 04/14/22 2102   zolpidem (AMBIEN) tablet 5 mg  5 mg Oral QHS PRN Clapacs, [name], MD        Lab Results:  Results for orders placed or performed during the hospital encounter of 04/11/22 (from the past 48 hour(s))  Glucose, capillary     Status: Abnormal   Collection Time: 04/13/22  4:10 PM  Result Value Ref Range   Glucose-Capillary 136 (H) 70 - 99 mg/dL    Comment: Glucose reference range applies only to samples taken after fasting for at least 8 hours.  Glucose, capillary     Status: Abnormal   Collection Time: 04/13/22  9:38 PM  Result Value Ref Range   Glucose-Capillary 217 (H) 70 - 99 mg/dL    Comment: Glucose reference range applies only to samples taken after fasting for at least 8 hours.  Glucose, capillary     Status: Abnormal   Collection Time: 04/14/22  8:12 AM  Result Value Ref Range   Glucose-Capillary 108 (H) 70 - 99 mg/dL    Comment: Glucose reference range applies only to samples taken after fasting for at least 8 hours.  Glucose, capillary     Status: Abnormal   Collection Time: 04/14/22 11:53 AM  Result Value Ref Range   Glucose-Capillary 110 (H) 70 - 99 mg/dL    Comment: Glucose reference range applies only to samples taken after fasting for at least 8 hours.  Glucose, capillary     Status: Abnormal   Collection Time: 04/14/22  4:45 PM  Result Value Ref Range   Glucose-Capillary 113 (H) 70 - 99 mg/dL    Comment: Glucose reference range applies only to samples taken after fasting for at least 8 hours.   Comment 1 Notify RN   Glucose, capillary     Status: Abnormal   Collection Time: 04/14/22  8:18 PM  Result Value Ref Range   Glucose-Capillary 132 (H) 70 - 99 mg/dL    Comment: Glucose reference range applies only to samples taken after fasting for at least 8 hours.  Glucose, capillary     Status: Abnormal   Collection Time: 04/15/22  7:23 AM  Result  Value Ref Range   Glucose-Capillary 126 (H) 70 - 99 mg/dL    Comment: Glucose reference range applies only to samples taken after fasting for at least 8 hours.    Blood Alcohol level:  Lab Results  Component Value Date   ETH <10 54/06/8118    Metabolic Disorder Labs: Lab Results  Component Value Date   HGBA1C 5.6 03/19/2022   MPG 114.02 03/19/2022   No results found for: "PROLACTIN" Lab Results  Component Value Date   CHOL 131 03/19/2022   TRIG 92 03/19/2022   HDL 33 (L) 03/19/2022   CHOLHDL 4.0 03/19/2022   VLDL 18 03/19/2022   LDLCALC 80 03/19/2022    Physical Findings: AIMS: Facial and Oral Movements Muscles of Facial Expression: None, normal Lips and Perioral Area: None, normal Jaw: None, normal Tongue: None, normal,Extremity Movements Upper (arms, wrists, hands, fingers): None, normal Lower (legs, knees, ankles, toes): Moderate (Rt. leg & hip pain. S/P Rt. hip surgery), Trunk Movements Neck, shoulders, hips: None, normal, Overall Severity Severity of abnormal movements (highest score from questions above): Moderate (R/T to Rt. hip surgery) Incapacitation due to abnormal movements: None, normal Patient's awareness of abnormal movements (rate only patient's report): No Awareness,  Dental Status Current problems with teeth and/or dentures?: No Does patient usually wear dentures?: No  CIWA:    COWS:     Musculoskeletal: Strength & Muscle Tone: within normal limits Gait & Station: unsteady Patient leans: N/A  Psychiatric Specialty Exam:  Presentation  General Appearance: Appropriate for Environment  Eye Contact:Good  Speech:Clear and Coherent  Speech Volume:Normal  Handedness:Right   Mood and Affect  Mood:Anxious (stated "a little anxious")  Affect:Congruent   Thought Process  Thought Processes:Linear; Coherent; Goal Directed  Descriptions of Associations:Intact  Orientation:Full (Time, Place and Person)  Thought Content:Logical;  WDL  History of Schizophrenia/Schizoaffective disorder:No  Duration of Psychotic Symptoms:No data recorded Hallucinations:No data recorded Ideas of Reference:None  Suicidal Thoughts:No data recorded Homicidal Thoughts:No data recorded  Sensorium  Memory:Immediate Good  Judgment:Good  Insight:Good   Executive Functions  Concentration:Good  Attention Span:Good  Recall:Good  Fund of Knowledge:Good  Language:Good   Psychomotor Activity  Psychomotor Activity:No data recorded  Assets  Assets:Housing; Catering manager; Desire for Improvement; Communication Skills; Resilience; Social Support   Sleep  Sleep:No data recorded   Physical Exam: Physical Exam Vitals and nursing note reviewed.  Constitutional:      Appearance: Normal appearance. He is normal weight.  Neurological:     General: No focal deficit present.     Mental Status: He is alert and oriented to person, place, and time.  Psychiatric:        Attention and Perception: Attention and perception normal.        Mood and Affect: Mood is anxious and depressed.        Speech: Speech normal.        Behavior: Behavior normal. Behavior is cooperative.        Thought Content: Thought content normal.        Cognition and Memory: Cognition and memory normal.        Judgment: Judgment normal.    Review of Systems  Constitutional: Negative.   HENT: Negative.    Eyes: Negative.   Respiratory: Negative.    Cardiovascular: Negative.   Gastrointestinal: Negative.   Genitourinary: Negative.   Musculoskeletal: Negative.   Skin: Negative.   Neurological: Negative.   Endo/Heme/Allergies: Negative.   Psychiatric/Behavioral:  Positive for depression.    Blood pressure (!) 165/78, pulse 61, temperature (!) 97.5 F (36.4 C), resp. rate 20, height '6\' 2"'$  (1.88 m), weight 87.8 kg, SpO2 99 %. Body mass index is 24.84 kg/m.   Treatment Plan Summary: Daily contact with patient to assess and evaluate  symptoms and progress in treatment, Medication management, and Plan continue current medications.  Parks Ranger, DO 04/15/2022, 1:59 PM

## 2022-04-15 NOTE — Progress Notes (Signed)
Physical Therapy Treatment Patient Details Name: John [name] MRN: [id number] DOB: 1940-11-06 Today's Date: 04/15/2022   History of Present Illness Pt is a 81 yo male with PMH of depression, anxiety, HTN, DM, OSA, prostate cancer, Horner syndrome, dissection of carotid artery, subdural hematoma, epidural hematoma. Previously hospitalized from 6/5-6/8 for severe depression and found to have abnormal EKG with A-fib. Pt receiving ECT for management of depression. Pt admitted 6/10 due to R femoral neck fx due to a fall in the  bathroom on the evening of 6/9. Pt underwent R hip hemiarthroplasty on 6/11. Pt discharged from inpatient unit and now admitted to [facility] psych unit. Evaluation performed 04/14/22 to resume OT services.    PT Comments    Pt sitting in day room on arrival. Pt was able to recite all precautions and WB status without assistance. Pt agreeable to PT. Ambulated 2x50 feet from day room to pts room at start of session and back to day room at end of session. Pt required verbal cues for maintaining RW at appropriate distance as pt tends to get too close to RW when stepping forward with L LE. Pt requires supervision for bed mobility and transfers for maintaining hip precautions. PT provided pt with new HEP booklet and reviewed and completed all exercises. Pt reported 1/10 pain in R hip at rest and 8/10 with SLR. Hip ABD/ADD in supine was modified so that pt wasn't completing a SLR and causing pain to elevate so significantly. Pt did not appear to be in distress with any exercises this date.    Recommendations for follow up therapy are one component of a multi-disciplinary discharge planning process, led by the attending physician.  Recommendations may be updated based on patient status, additional functional criteria and insurance authorization.  Follow Up Recommendations  Follow physician's recommendations for discharge plan and follow up therapies     Assistance Recommended at Discharge  Frequent or constant Supervision/Assistance  Patient can return home with the following A little help with walking and/or transfers;A little help with bathing/dressing/bathroom   Equipment Recommendations  Rolling walker (2 wheels);BSC/3in1    Recommendations for Other Services       Precautions / Restrictions Precautions Precautions: Posterior Hip;Fall Precaution Booklet Issued: Yes (comment) (recommend highlighting group of AM/PM exercises in packet to help pt remember) Restrictions Weight Bearing Restrictions: Yes RLE Weight Bearing: Partial weight bearing RLE Partial Weight Bearing Percentage or Pounds: 50     Mobility  Bed Mobility Overal bed mobility: Needs Assistance Bed Mobility: Supine to Sit, Sit to Supine     Supine to sit: Supervision Sit to supine: Supervision   General bed mobility comments: Pt performed all bed mobility tasks today without physical assistance. Supervision for maintaining precautions.    Transfers Overall transfer level: Needs assistance Equipment used: Rolling walker (2 wheels) Transfers: Sit to/from Stand Sit to Stand: Supervision           General transfer comment: pt able to complete sit-to-stands without physical assistance. Supervision for maintaining precautions and verbal cues to kick R LE out prior to sitting down.    Ambulation/Gait Ambulation/Gait assistance: Min guard Gait Distance (Feet): 100 Feet (2 bouts of 50 ft ea walking to and from day room to pt room) Assistive device: Rolling walker (2 wheels) Gait Pattern/deviations: Step-through pattern, Narrow base of support, Decreased weight shift to right, Decreased stance time - right Gait velocity: decreased.     General Gait Details: verbal cues required for sequencing with RW and maintaining RW  at appropriate distance; pt takes long strides and gets too close to RW when stepping fwd with L LE   Stairs             Wheelchair Mobility    Modified Rankin  (Stroke Patients Only)       Balance Overall balance assessment: Needs assistance Sitting-balance support: Feet supported, No upper extremity supported Sitting balance-Leahy Scale: Good Sitting balance - Comments: Sat EOB back unsupported, no UE support and feet on floor. Reaching for RW, completing LAQs without LOB.   Standing balance support: Reliant on assistive device for balance, Bilateral upper extremity supported, Single extremity supported Standing balance-Leahy Scale: Fair Standing balance comment: No LOB ambulating with RW. Pants were starting to fall down standing at bedside and pt was able to take 1 UE off RW to readjust clothing prior to sitting down.                            Cognition Arousal/Alertness: Awake/alert Behavior During Therapy: WFL for tasks assessed/performed, Flat affect Overall Cognitive Status: Within Functional Limits for tasks assessed                                 General Comments: decreased carryover with cues during mobility tasks and HEP        Exercises Total Joint Exercises Ankle Circles/Pumps: AROM, Both, 10 reps, Supine Quad Sets: AROM, Strengthening, Right, 10 reps, Supine Gluteal Sets: AROM, Both, 10 reps, Supine Towel Squeeze: AROM, Strengthening, Both, 10 reps, Supine Short Arc Quad: AROM, Strengthening, 10 reps, Supine, Right Heel Slides: AROM, Strengthening, Right, 10 reps, Supine Hip ABduction/ADduction: AROM, Strengthening, Right, 10 reps, Supine, Other (comment) (pt reported increased pain to 8/10 in R lateral hip; modified exercise to slide across bed rather than complete SLR and ABD.) Straight Leg Raises: AROM, Strengthening, Right, Other reps (comment) (2 reps; pt reporting increased pain level at 8/10 in R lateral hip) Long Arc Quad: AROM, Strengthening, Right, Other reps (comment) (2x10 reps)    General Comments        Pertinent Vitals/Pain Pain Assessment Pain Assessment: 0-10 Pain Score: 8   (pt reported 1/10 pain at rest; 8/10 with hip abd in supine (pt education on pain and modification to exercises if severe pain)) Pain Location: R hip Pain Descriptors / Indicators: Discomfort Pain Intervention(s): Limited activity within patient's tolerance, Monitored during session, Repositioned    Home Living                          Prior Function            PT Goals (current goals can now be found in the care plan section) Acute Rehab PT Goals Patient Stated Goal: to go home PT Goal Formulation: With patient Time For Goal Achievement: 04/27/22 Potential to Achieve Goals: Good Progress towards PT goals: Progressing toward goals    Frequency    7X/week      PT Plan Current plan remains appropriate    Co-evaluation              AM-PAC PT "6 Clicks" Mobility   Outcome Measure  Help needed turning from your back to your side while in a flat bed without using bedrails?: A Little Help needed moving from lying on your back to sitting on the side of a flat bed without using  bedrails?: A Little Help needed moving to and from a bed to a chair (including a wheelchair)?: A Little Help needed standing up from a chair using your arms (e.g., wheelchair or bedside chair)?: A Little Help needed to walk in hospital room?: A Little Help needed climbing 3-5 steps with a railing? : A Lot 6 Click Score: 17    End of Session Equipment Utilized During Treatment: Gait belt Activity Tolerance: Patient tolerated treatment well Patient left: Other (comment) (pt left seated in [facility] day room) Nurse Communication: Mobility status PT Visit Diagnosis: Muscle weakness (generalized) (M62.81);Difficulty in walking, not elsewhere classified (R26.2);Unsteadiness on feet (R26.81) Pain - Right/Left: Right Pain - part of body: Hip     Time: 3825-0539 PT Time Calculation (min) (ACUTE ONLY): 32 min  Charges:                        Rella Larve, SPT  Jashua Knaak 04/15/2022,  3:39 PM

## 2022-04-15 NOTE — Plan of Care (Signed)
Patient presents anxious with thought blocking.  Pt has delayed response but denies SI/HI/AV/H and contracts for safety. Pt did rate Anxiety/Depression 4/10. Continues to request ECT Tx.  MD aware. Pt did comply with scheduled medications.  Pt c/o incontinence and request incontinent pad.  Pt did have minimal participation on therapeutic unit.  Q 15 minute safety checks in place.  Will continue plan of care.     Problem: Nutrition: Goal: Adequate nutrition will be maintained Outcome: Progressing   Problem: Coping: Goal: Level of anxiety will decrease Outcome: Not Progressing   Problem: Elimination: Goal: Will not experience complications related to bowel motility Outcome: Not Progressing   Problem: Education: Goal: Mental status will improve Outcome: Not Progressing

## 2022-04-16 ENCOUNTER — Other Ambulatory Visit: Payer: Self-pay | Admitting: Psychiatry

## 2022-04-16 DIAGNOSIS — F332 Major depressive disorder, recurrent severe without psychotic features: Secondary | ICD-10-CM | POA: Diagnosis not present

## 2022-04-16 LAB — GLUCOSE, CAPILLARY
Glucose-Capillary: 118 mg/dL — ABNORMAL HIGH (ref 70–99)
Glucose-Capillary: 105 mg/dL — ABNORMAL HIGH (ref 70–99)
Glucose-Capillary: 126 mg/dL — ABNORMAL HIGH (ref 70–99)

## 2022-04-16 NOTE — Progress Notes (Signed)
Pt lying in bed with eyes open and sitter at bedside; pt calm, cooperative. Pt states "I'm feeling good." He c/o "steady" R hip pain which he rates 5/10; however, he declines pain medication at this time. He currently denies SI/HI/AVH. He describes his sleep as "pretty well" and his appetite as "good". He currently denies anxiety but states that he feels "a little bit" depressed due to "still being here"; he rates his depression 4/10. No acute distress noted.

## 2022-04-16 NOTE — Group Note (Signed)
LCSW Group Therapy Note   Group Date: 04/16/2022 Start Time: 1430 End Time: 1535   Due to the acuity and complex discharge plans, group was not held. Patient was provided therapeutic worksheets and asked to meet with CSW as needed.  Leza Apsey A [place], LCSWA 04/16/2022  3:49 PM

## 2022-04-16 NOTE — Plan of Care (Signed)
Patient remain alert, calm, cooperative, weak with unsteady gait. Remain ambulatory with front wheel walker. Flat affect, anxious and depressed mood. Delayed speech. Patient denies SI, HI, and AVH. Endorsed anxiety of 4/10 stated his anxiety is due to his situation but did not provide any other detail. Endorsed depression of 5/10. Toiled x1 by Probation officer. Patient is currently in his room in bed resting in no apparent distress at this time. Tele sitter in place. Q15 minute safety check continue.   Problem: Education: Goal: Knowledge of General Education information will improve Description: Including pain rating scale, medication(s)/side effects and non-pharmacologic comfort measures Outcome: Progressing   Problem: Health Behavior/Discharge Planning: Goal: Ability to manage health-related needs will improve Outcome: Progressing   Problem: Clinical Measurements: Goal: Ability to maintain clinical measurements within normal limits will improve Outcome: Progressing Goal: Will remain free from infection Outcome: Progressing Goal: Diagnostic test results will improve Outcome: Progressing Goal: Respiratory complications will improve Outcome: Progressing Goal: Cardiovascular complication will be avoided Outcome: Progressing   Problem: Activity: Goal: Risk for activity intolerance will decrease Outcome: Progressing   Problem: Nutrition: Goal: Adequate nutrition will be maintained Outcome: Progressing   Problem: Coping: Goal: Level of anxiety will decrease Outcome: Progressing   Problem: Elimination: Goal: Will not experience complications related to bowel motility Outcome: Progressing Goal: Will not experience complications related to urinary retention Outcome: Progressing   Problem: Pain Managment: Goal: General experience of comfort will improve Outcome: Progressing   Problem: Safety: Goal: Ability to remain free from injury will improve Outcome: Progressing   Problem: Skin  Integrity: Goal: Risk for impaired skin integrity will decrease Outcome: Progressing   Problem: Education: Goal: Knowledge of [facility] Education information/materials will improve Outcome: Progressing Goal: Emotional status will improve Outcome: Progressing Goal: Mental status will improve Outcome: Progressing Goal: Verbalization of understanding the information provided will improve Outcome: Progressing   Problem: Activity: Goal: Interest or engagement in activities will improve Outcome: Progressing Goal: Sleeping patterns will improve Outcome: Progressing   Problem: Coping: Goal: Ability to verbalize frustrations and anger appropriately will improve Outcome: Progressing Goal: Ability to demonstrate self-control will improve Outcome: Progressing   Problem: Health Behavior/Discharge Planning: Goal: Identification of resources available to assist in meeting health care needs will improve Outcome: Progressing Goal: Compliance with treatment plan for underlying cause of condition will improve Outcome: Progressing   Problem: Physical Regulation: Goal: Ability to maintain clinical measurements within normal limits will improve Outcome: Progressing   Problem: Safety: Goal: Periods of time without injury will increase Outcome: Progressing   Problem: Education: Goal: Utilization of techniques to improve thought processes will improve Outcome: Progressing Goal: Knowledge of the prescribed therapeutic regimen will improve Outcome: Progressing   Problem: Activity: Goal: Interest or engagement in leisure activities will improve Outcome: Progressing Goal: Imbalance in normal sleep/wake cycle will improve Outcome: Progressing   Problem: Health Behavior/Discharge Planning: Goal: Ability to make decisions will improve Outcome: Progressing Goal: Compliance with therapeutic regimen will improve Outcome: Progressing   Problem: Safety: Goal: Ability to disclose and  discuss suicidal ideas will improve Outcome: Progressing Goal: Ability to identify and utilize support systems that promote safety will improve Outcome: Progressing   Problem: Self-Concept: Goal: Will verbalize positive feelings about self Outcome: Progressing Goal: Level of anxiety will decrease Outcome: Progressing

## 2022-04-16 NOTE — Progress Notes (Signed)
[facility] MD Progress Note  04/16/2022 11:53 AM John [name]  MRN:  [id number] Subjective: John [name] that was doing a little bit better today.  Today he is little bit more flat and withdrawn.  His speech is a little delayed and he looks like he is in pain but he says he is not.  Physical therapy is working with him.  Dr. Weber Cooks plans on restarting his ECT tomorrow.  He denies any suicidal ideation.  Principal Problem: Severe recurrent major depression without psychotic features ([place]) Diagnosis: Principal Problem:   Severe recurrent major depression without psychotic features ([place]) Active Problems:   Paroxysmal atrial fibrillation (HCC)   Intertrochanteric fracture of right hip (HCC)  Total Time spent with patient: 15 minutes  Past Psychiatric History: Chronic recurrent major depression.  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 06/2015 CT Head: abnl thickening of mid-dist R cervical ICA w/o narrowing - ? vasculitis and thrombosed/healing dissection; b. 01/2021 Carotid U/S: 1-39% bilat ICA stenoses.   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 Age 18 - struck in head w/ baseball --> s/p craniotomy.   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 Approx 2015 - eval in [place] - conservatively managed.    Past Surgical History:  Procedure Laterality Date   ANKLE ARTHROSCOPY Left    fracture   CHOLECYSTECTOMY  2007   [place]   left frontal craniotomy for epidural hematoma   HIP ARTHROPLASTY  Right 04/07/2022   Procedure: ARTHROPLASTY BIPOLAR HIP (HEMIARTHROPLASTY);  Surgeon: Earnestine Leys, MD;  Location: [facility] ORS;  Service: Orthopedics;  Laterality: Right;   INSERTION OF MESH  01/02/2022   Procedure: INSERTION OF MESH;  Surgeon: Herbert Pun, MD;  Location: [facility] ORS;  Service: General;;   LIGAMENT REPAIR Right    thumb   RETINAL DETACHMENT SURGERY     TREATMENT FISTULA ANAL  2006   Family History:  Family History  Problem Relation Age of Onset   Osteoporosis Mother    Depression Mother    Prostate cancer Father    Hypertension Father    Diabetes Brother    Kidney disease Neg Hx    Family Psychiatric  History:  Social History:  Social History   Substance and Sexual Activity  Alcohol Use Not Currently     Social History   Substance and Sexual Activity  Drug Use No    Social History   Socioeconomic History   Marital status: Married    Spouse name: John [name]   Number of children: 2   Years of education: Not on file   Highest education level: Not on file  Occupational History   Not on file  Tobacco Use   Smoking status: Never   Smokeless tobacco: Never  Vaping Use   Vaping Use: Never used  Substance and Sexual Activity   Alcohol use: Not Currently   Drug use: No   Sexual activity: Yes    Birth control/protection: None  Other Topics Concern   Not on file  Social History Narrative   Live at home with wife.    Social Determinants of Health   Financial Resource Strain: Not on file  Food Insecurity: Not on file  Transportation Needs: Not on file  Physical Activity: Not on file  Stress: Not on file  Social Connections: Not on file   Additional Social History:                         Sleep: Good  Appetite:  Good  Current Medications: Current Facility-Administered Medications  Medication Dose Route Frequency Provider Last Rate Last Admin   acetaminophen (TYLENOL) tablet 325-650 mg  325-650 mg Oral Q6H PRN Clapacs, [name], MD        alum & mag hydroxide-simeth (MAALOX/MYLANTA) 200-200-20 MG/5ML suspension 30 mL  30 mL Oral Q4H PRN Clapacs, [name], MD       aspirin EC tablet 81 mg  81 mg Oral Daily Clapacs, [name], MD   81 mg at 04/16/22 1448   bisacodyl (DULCOLAX) suppository 10 mg  10 mg Rectal Daily PRN Clapacs, [name], MD       buPROPion (WELLBUTRIN XL) 24 hr tablet 300 mg  300 mg Oral Daily Clapacs, John T, MD   300 mg at 04/16/22 1856   docusate sodium (COLACE) capsule 100 mg  100 mg Oral BID Clapacs, John T, MD   100 mg at 04/16/22 3149   ferrous sulfate tablet 325 mg  325 mg Oral Q breakfast Clapacs, [name], MD   325 mg at 04/16/22 7026   HYDROcodone-acetaminophen (NORCO/VICODIN) 5-325 MG per tablet 1-2 tablet  1-2 tablet Oral Q4H PRN Clapacs, [name], MD   1 tablet at 04/12/22 0238   insulin glargine-yfgn (SEMGLEE) injection 5 Units  5 Units Subcutaneous BID Clapacs, [name], MD   5 Units at 04/16/22 0843   menthol-cetylpyridinium (CEPACOL) lozenge 3 mg  1 lozenge Oral PRN Clapacs, [name], MD       Or   phenol (CHLORASEPTIC) mouth spray 1 spray  1 spray Mouth/Throat PRN Clapacs, [name], MD       methocarbamol (ROBAXIN) tablet 500 mg  500 mg Oral Q6H PRN Clapacs, [name], MD       Or   methocarbamol (ROBAXIN) 500 mg in dextrose 5 % 50 mL IVPB  500 mg Intravenous Q6H PRN Clapacs, John T, MD       metoprolol tartrate (LOPRESSOR) tablet 25 mg  25 mg Oral BID Clapacs, John T, MD   25 mg at 04/16/22 0925   polyethylene glycol (MIRALAX / GLYCOLAX) packet 17 g  17 g Oral Daily Clapacs, John T, MD   17 g at 04/12/22 1016   prazosin (MINIPRESS) capsule 1 mg  1 mg Oral QHS Clapacs, John T, MD   1 mg at 04/15/22 2159   senna (SENOKOT) tablet 8.6 mg  1 tablet Oral BID Clapacs, John T, MD   8.6 mg at 04/16/22 3785   sodium phosphate (FLEET) 7-19 GM/118ML enema 1 enema  1 enema Rectal Once PRN Clapacs, [name], MD       traZODone (DESYREL) tablet 50 mg  50 mg Oral QHS PRN Clapacs, [name], MD   50 mg at 04/14/22 2102   zolpidem  (  AMBIEN) tablet 5 mg  5 mg Oral QHS PRN Clapacs, [name], MD        Lab Results:  Results for orders placed or performed during the hospital encounter of 04/11/22 (from the past 48 hour(s))  Glucose, capillary     Status: Abnormal   Collection Time: 04/14/22  4:45 PM  Result Value Ref Range   Glucose-Capillary 113 (H) 70 - 99 mg/dL    Comment: Glucose reference range applies only to samples taken after fasting for at least 8 hours.   Comment 1 Notify RN   Glucose, capillary     Status: Abnormal   Collection Time: 04/14/22  8:18 PM  Result Value Ref Range   Glucose-Capillary 132 (H) 70 - 99 mg/dL    Comment: Glucose reference range applies only to samples taken after fasting for at least 8 hours.  Glucose, capillary     Status: Abnormal   Collection Time: 04/15/22  7:23 AM  Result Value Ref Range   Glucose-Capillary 126 (H) 70 - 99 mg/dL    Comment: Glucose reference range applies only to samples taken after fasting for at least 8 hours.  Glucose, capillary     Status: Abnormal   Collection Time: 04/15/22  3:23 PM  Result Value Ref Range   Glucose-Capillary 187 (H) 70 - 99 mg/dL    Comment: Glucose reference range applies only to samples taken after fasting for at least 8 hours.  Glucose, capillary     Status: Abnormal   Collection Time: 04/15/22  9:55 PM  Result Value Ref Range   Glucose-Capillary 235 (H) 70 - 99 mg/dL    Comment: Glucose reference range applies only to samples taken after fasting for at least 8 hours.  Glucose, capillary     Status: Abnormal   Collection Time: 04/16/22  8:23 AM  Result Value Ref Range   Glucose-Capillary 118 (H) 70 - 99 mg/dL    Comment: Glucose reference range applies only to samples taken after fasting for at least 8 hours.  Glucose, capillary     Status: Abnormal   Collection Time: 04/16/22 11:13 AM  Result Value Ref Range   Glucose-Capillary 105 (H) 70 - 99 mg/dL    Comment: Glucose reference range applies only to samples taken after  fasting for at least 8 hours.    Blood Alcohol level:  Lab Results  Component Value Date   ETH <10 42/70/6237    Metabolic Disorder Labs: Lab Results  Component Value Date   HGBA1C 5.6 03/19/2022   MPG 114.02 03/19/2022   No results found for: "PROLACTIN" Lab Results  Component Value Date   CHOL 131 03/19/2022   TRIG 92 03/19/2022   HDL 33 (L) 03/19/2022   CHOLHDL 4.0 03/19/2022   VLDL 18 03/19/2022   LDLCALC 80 03/19/2022    Physical Findings: AIMS: Facial and Oral Movements Muscles of Facial Expression: None, normal Lips and Perioral Area: None, normal Jaw: None, normal Tongue: None, normal,Extremity Movements Upper (arms, wrists, hands, fingers): None, normal Lower (legs, knees, ankles, toes): Moderate (Rt. leg & hip pain. S/P Rt. hip surgery), Trunk Movements Neck, shoulders, hips: None, normal, Overall Severity Severity of abnormal movements (highest score from questions above): Moderate (R/T to Rt. hip surgery) Incapacitation due to abnormal movements: None, normal Patient's awareness of abnormal movements (rate only patient's report): No Awareness, Dental Status Current problems with teeth and/or dentures?: No Does patient usually wear dentures?: No  CIWA:    COWS:  Musculoskeletal: Strength & Muscle Tone: within normal limits Gait & Station: unsteady Patient leans: N/A  Psychiatric Specialty Exam:  Presentation  General Appearance: Appropriate for Environment  Eye Contact:Good  Speech:Clear and Coherent  Speech Volume:Normal  Handedness:Right   Mood and Affect  Mood:Anxious (stated "a little anxious")  Affect:Congruent   Thought Process  Thought Processes:Linear; Coherent; Goal Directed  Descriptions of Associations:Intact  Orientation:Full (Time, Place and Person)  Thought Content:Logical; WDL  History of Schizophrenia/Schizoaffective disorder:No  Duration of Psychotic Symptoms:No data recorded Hallucinations:No data  recorded Ideas of Reference:None  Suicidal Thoughts:No data recorded Homicidal Thoughts:No data recorded  Sensorium  Memory:Immediate Good  Judgment:Good  Insight:Good   Executive Functions  Concentration:Good  Attention Span:Good  Recall:Good  Fund of Knowledge:Good  Language:Good   Psychomotor Activity  Psychomotor Activity:No data recorded  Assets  Assets:Housing; Financial Resources/Insurance; Desire for Improvement; Communication Skills; Resilience; Social Support   Sleep  Sleep:No data recorded   Physical Exam: Physical Exam Vitals and nursing note reviewed.  Constitutional:      Appearance: Normal appearance. He is normal weight.  Neurological:     General: No focal deficit present.     Mental Status: He is alert and oriented to person, place, and time.  Psychiatric:        Attention and Perception: Attention and perception normal.        Mood and Affect: Mood is anxious and depressed. Affect is flat.        Speech: Speech is delayed.        Behavior: Behavior is agitated.        Thought Content: Thought content normal.        Cognition and Memory: Cognition and memory normal.        Judgment: Judgment normal.    Review of Systems  Constitutional: Negative.   HENT: Negative.    Eyes: Negative.   Respiratory: Negative.    Cardiovascular: Negative.   Gastrointestinal: Negative.   Genitourinary: Negative.   Musculoskeletal: Negative.   Skin: Negative.   Neurological: Negative.   Endo/Heme/Allergies: Negative.   Psychiatric/Behavioral:  Positive for depression.    Blood pressure (!) 175/86, pulse (!) 57, temperature 97.7 F (36.5 C), resp. rate 17, height '6\' 2"'$  (1.88 m), weight 87.8 kg, SpO2 100 %. Body mass index is 24.84 kg/m.   Treatment Plan Summary: Daily contact with patient to assess and evaluate symptoms and progress in treatment, Medication management, and Plan consider Abilify or Latuda as an adjunct to his Wellbutrin.  I will  wait to see how he does with ECT first.  Parks Ranger, DO 04/16/2022, 11:53 AM

## 2022-04-16 NOTE — Progress Notes (Addendum)
Physical Therapy Treatment Patient Details Name: John [name] MRN: [id number] DOB: 1940/11/07 Today's Date: 04/16/2022   History of Present Illness Pt is a 81 yo male with PMH of depression, anxiety, HTN, DM, OSA, prostate cancer, Horner syndrome, dissection of carotid artery, subdural hematoma, epidural hematoma. Previously hospitalized from 6/5-6/8 for severe depression and found to have abnormal EKG with A-fib. Pt receiving ECT for management of depression. Pt admitted 6/10 due to R femoral neck fx due to a fall in the  bathroom on the evening of 6/9. Pt underwent R hip hemiarthroplasty on 6/11. Pt discharged from inpatient unit and now admitted to [facility] psych unit. Evaluation performed 04/14/22 to resume OT services.   PT Comments    Patient is agreeable to PT. He is making progress towards meeting PT goals. He continues to require occasional cues for technique with transfers to maintain hip precautions. He ambulated in hallway with cues for rolling walker placement and technique for off loading RLE to maintain weight bearing restrictions. He participated with LE exercises for strengthening. Recommend to continue PT to maximize independence and facilitate return to prior level of function.    Recommendations for follow up therapy are one component of a multi-disciplinary discharge planning process, led by the attending physician.  Recommendations may be updated based on patient status, additional functional criteria and insurance authorization.  Follow Up Recommendations  Follow physician's recommendations for discharge plan and follow up therapies     Assistance Recommended at Discharge Frequent or constant Supervision/Assistance  Patient can return home with the following A little help with walking and/or transfers;A little help with bathing/dressing/bathroom   Equipment Recommendations  Rolling walker (2 wheels);BSC/3in1    Recommendations for Other Services       Precautions /  Restrictions Precautions Precautions: Posterior Hip;Fall Restrictions Weight Bearing Restrictions: Yes RLE Weight Bearing: Partial weight bearing RLE Partial Weight Bearing Percentage or Pounds: 50     Mobility  Bed Mobility Overal bed mobility: Needs Assistance         Sit to supine: Supervision   General bed mobility comments: increased time and effort required.    Transfers Overall transfer level: Needs assistance Equipment used: Rolling walker (2 wheels) Transfers: Sit to/from Stand Sit to Stand: Min guard           General transfer comment: verbal cues for RLE positioning with transfers to maintain hip precuations    Ambulation/Gait Ambulation/Gait assistance: Min guard Gait Distance (Feet): 110 Feet (2 bouts of walking, 30f and then 68f Assistive device: Rolling walker (2 wheels) Gait Pattern/deviations: Decreased stance time - right, Decreased stride length, Step-to pattern, Step-through pattern Gait velocity: decreased     General Gait Details: step to pattern initially progressing to step through. cues to stand closer to rolling walker and technique to maintain 50% weight bearing  Stairs             Wheelchair Mobility    Modified Rankin (Stroke Patients Only)       Balance Overall balance assessment: Needs assistance Sitting-balance support: Feet supported, No upper extremity supported Sitting balance-Leahy Scale: Good     Standing balance support: Reliant on assistive device for balance, Bilateral upper extremity supported Standing balance-Leahy Scale: Fair Standing balance comment: no external support required to maintain standing balance with UE supported on rolling walker                            Cognition Arousal/Alertness: Awake/alert Behavior  During Therapy: WFL for tasks assessed/performed, Flat affect Overall Cognitive Status: Within Functional Limits for tasks assessed                                  General Comments: patient able to follow single step commands consistently        Exercises Total Joint Exercises Ankle Circles/Pumps: AROM, Strengthening, Right, 10 reps Quad Sets: AROM, Strengthening, Right, 10 reps, Seated Hip ABduction/ADduction: AAROM, Strengthening, Right, 10 reps, Seated Long Arc Quad: AAROM, Strengthening, Right, 5 reps, Seated Other Exercises Other Exercises: verbal and visual cues for exercise technique for strengthening    General Comments        Pertinent Vitals/Pain Pain Assessment Pain Assessment: 0-10 Pain Score: 2  Pain Location: R hip Pain Descriptors / Indicators: Discomfort Pain Intervention(s): Limited activity within patient's tolerance, Monitored during session, Repositioned    Home Living                          Prior Function            PT Goals (current goals can now be found in the care plan section) Acute Rehab PT Goals Patient Stated Goal: to go home PT Goal Formulation: With patient Time For Goal Achievement: 04/27/22 Potential to Achieve Goals: Good Progress towards PT goals: Progressing toward goals    Frequency    7X/week      PT Plan Current plan remains appropriate    Co-evaluation              AM-PAC PT "6 Clicks" Mobility   Outcome Measure  Help needed turning from your back to your side while in a flat bed without using bedrails?: A Little Help needed moving from lying on your back to sitting on the side of a flat bed without using bedrails?: A Little Help needed moving to and from a bed to a chair (including a wheelchair)?: A Little Help needed standing up from a chair using your arms (e.g., wheelchair or bedside chair)?: A Little Help needed to walk in hospital room?: A Little Help needed climbing 3-5 steps with a railing? : A Lot 6 Click Score: 17    End of Session Equipment Utilized During Treatment: Gait belt Activity Tolerance: Patient tolerated treatment well Patient  left: in bed;with call bell/phone within reach;with bed alarm set Nurse Communication: Mobility status PT Visit Diagnosis: Muscle weakness (generalized) (M62.81);Difficulty in walking, not elsewhere classified (R26.2);Unsteadiness on feet (R26.81) Pain - Right/Left: Right Pain - part of body: Hip     Time: 0762-2633 PT Time Calculation (min) (ACUTE ONLY): 20 min  Charges:  $Therapeutic Exercise: 8-22 mins                    Minna Merritts, PT, MPT    Percell Locus 04/16/2022, 2:31 PM

## 2022-04-17 ENCOUNTER — Encounter: Payer: Self-pay | Admitting: Psychiatry

## 2022-04-17 ENCOUNTER — Inpatient Hospital Stay: Payer: Medicare Other | Admitting: Anesthesiology

## 2022-04-17 ENCOUNTER — Ambulatory Visit: Payer: Medicare Other | Attending: Psychiatry

## 2022-04-17 DIAGNOSIS — F418 Other specified anxiety disorders: Secondary | ICD-10-CM | POA: Diagnosis not present

## 2022-04-17 DIAGNOSIS — Z7984 Long term (current) use of oral hypoglycemic drugs: Secondary | ICD-10-CM | POA: Insufficient documentation

## 2022-04-17 DIAGNOSIS — F332 Major depressive disorder, recurrent severe without psychotic features: Secondary | ICD-10-CM | POA: Diagnosis not present

## 2022-04-17 LAB — GLUCOSE, CAPILLARY
Glucose-Capillary: 99 mg/dL (ref 70–99)
Glucose-Capillary: 179 mg/dL — ABNORMAL HIGH (ref 70–99)

## 2022-04-17 MED ORDER — METHOHEXITAL SODIUM 100 MG/10ML IV SOSY
PREFILLED_SYRINGE | INTRAVENOUS | Status: DC | PRN
  Administered 2022-04-17: 90 mg via INTRAVENOUS

## 2022-04-17 MED ORDER — MIDAZOLAM HCL 2 MG/2ML IJ SOLN
INTRAMUSCULAR | Status: AC
  Filled 2022-04-17: qty 2

## 2022-04-17 MED ORDER — MIDAZOLAM HCL 2 MG/2ML IJ SOLN
INTRAMUSCULAR | Status: DC | PRN
  Administered 2022-04-17: 2 mg via INTRAVENOUS

## 2022-04-17 MED ORDER — FENTANYL CITRATE PF 50 MCG/ML IJ SOSY: 25.0000 ug | PREFILLED_SYRINGE | INTRAMUSCULAR | Status: DC | PRN

## 2022-04-17 MED ORDER — SUCCINYLCHOLINE CHLORIDE 200 MG/10ML IV SOSY
PREFILLED_SYRINGE | INTRAVENOUS | Status: AC
  Filled 2022-04-17: qty 10

## 2022-04-17 MED ORDER — SODIUM CHLORIDE 0.9 % IV SOLN: INTRAVENOUS | Status: DC | PRN

## 2022-04-17 MED ORDER — SUCCINYLCHOLINE CHLORIDE 200 MG/10ML IV SOSY
PREFILLED_SYRINGE | INTRAVENOUS | Status: DC | PRN
  Administered 2022-04-17: 100 mg via INTRAVENOUS

## 2022-04-17 MED ORDER — ONDANSETRON HCL 4 MG/2ML IJ SOLN: 4.0000 mg | Freq: Once | INTRAMUSCULAR | Status: DC | PRN

## 2022-04-17 NOTE — Progress Notes (Signed)
Patient observed in room resting and assisted to bathroom this morning. Patient washed face and laid back down while awaiting ECT. Patient denies SI, HI, and A/V/H with no plan/intent but still states feeling depressed. Patient states sleeping well last night and is hoping ECT helps with his depression. Patient appears to thought block but does well with redirection/guidance. Appropriate gait with walker and assistance. Patient resting in bed with tele sitter by bedside.

## 2022-04-17 NOTE — H&P (Signed)
John [name] is an 81 y.o. male.   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 06/2015 CT Head: abnl thickening of mid-dist R cervical ICA w/o narrowing - ? vasculitis and thrombosed/healing dissection; b. 01/2021 Carotid U/S: 1-39% bilat ICA stenoses.   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 Age 60 - struck in head w/ baseball --> s/p craniotomy.   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 Approx 2015 - eval in [place] - conservatively managed.    Past Surgical History:  Procedure Laterality Date   ANKLE ARTHROSCOPY Left    fracture   CHOLECYSTECTOMY  2007   [place]   left frontal craniotomy for epidural hematoma   HIP ARTHROPLASTY Right 04/07/2022   Procedure: ARTHROPLASTY BIPOLAR HIP (HEMIARTHROPLASTY);  Surgeon: Earnestine Leys, MD;  Location: [facility] ORS;  Service: Orthopedics;  Laterality: Right;   INSERTION OF MESH  01/02/2022   Procedure: INSERTION OF MESH;  Surgeon: Herbert Pun, MD;  Location: [facility] ORS;  Service: General;;   LIGAMENT REPAIR Right    thumb   RETINAL DETACHMENT SURGERY     TREATMENT FISTULA ANAL  2006    Family History  Problem Relation Age of Onset   Osteoporosis Mother    Depression Mother    Prostate cancer Father    Hypertension Father    Diabetes Brother    Kidney disease Neg Hx    Social History:  reports that he has never smoked. He has never used smokeless tobacco. He reports that he does not  currently use alcohol. He reports that he does not use drugs.  Allergies: No Known Allergies  Medications Prior to Admission  Medication Sig Dispense Refill   alum & mag hydroxide-simeth (MAALOX/MYLANTA) 200-200-20 MG/5ML suspension Take 30 mLs by mouth every 4 (four) hours as needed for indigestion. 355 mL 0   aspirin EC 81 MG tablet Take 81 mg by mouth daily.     buPROPion (WELLBUTRIN XL) 150 MG 24 hr tablet Take 1 tablet (150 mg total) by mouth daily. 30 tablet 0   docusate sodium (COLACE) 100 MG capsule Take 1 capsule (100 mg total) by mouth 2 (two) times daily. 10 capsule 0   ferrous sulfate 325 (65 FE) MG tablet Take 1 tablet (325 mg total) by mouth daily with breakfast.  3   HYDROcodone-acetaminophen (NORCO/VICODIN) 5-325 MG tablet Take 1-2 tablets by mouth every 4 (four) hours as needed for moderate pain (pain score 4-6). 30 tablet 0   insulin aspart (NOVOLOG) 100 UNIT/ML injection Inject 0-9 Units into the skin 3 (three) times daily with meals. 10 mL 11   insulin glargine-yfgn (SEMGLEE) 100 UNIT/ML injection Inject 0.08 mLs (8 Units total) into the skin 2 (two) times daily. 10 mL 11   LORazepam (ATIVAN)  1 MG tablet Take 1 tablet (1 mg total) by mouth every 4 (four) hours as needed for anxiety, sedation or sleep. 30 tablet 0   methocarbamol (ROBAXIN) 500 MG tablet Take 1 tablet (500 mg total) by mouth every 6 (six) hours as needed for muscle spasms.     metoprolol tartrate (LOPRESSOR) 25 MG tablet Take 1 tablet (25 mg total) by mouth 2 (two) times daily. 60 tablet 0   polyethylene glycol (MIRALAX / GLYCOLAX) 17 g packet Take 17 g by mouth daily as needed. 14 each 0   traZODone (DESYREL) 100 MG tablet Take 1 tablet (100 mg total) by mouth at bedtime as needed for sleep. 30 tablet 0    Results for orders placed or performed during the hospital encounter of 04/11/22 (from the past 48 hour(s))  Glucose, capillary     Status: Abnormal   Collection Time: 04/15/22  3:23 PM  Result Value Ref  Range   Glucose-Capillary 187 (H) 70 - 99 mg/dL    Comment: Glucose reference range applies only to samples taken after fasting for at least 8 hours.  Glucose, capillary     Status: Abnormal   Collection Time: 04/15/22  9:55 PM  Result Value Ref Range   Glucose-Capillary 235 (H) 70 - 99 mg/dL    Comment: Glucose reference range applies only to samples taken after fasting for at least 8 hours.  Glucose, capillary     Status: Abnormal   Collection Time: 04/16/22  8:23 AM  Result Value Ref Range   Glucose-Capillary 118 (H) 70 - 99 mg/dL    Comment: Glucose reference range applies only to samples taken after fasting for at least 8 hours.  Glucose, capillary     Status: Abnormal   Collection Time: 04/16/22 11:13 AM  Result Value Ref Range   Glucose-Capillary 105 (H) 70 - 99 mg/dL    Comment: Glucose reference range applies only to samples taken after fasting for at least 8 hours.  Glucose, capillary     Status: Abnormal   Collection Time: 04/16/22  9:31 PM  Result Value Ref Range   Glucose-Capillary 126 (H) 70 - 99 mg/dL    Comment: Glucose reference range applies only to samples taken after fasting for at least 8 hours.   No results found.  Review of Systems  Constitutional: Negative.   HENT: Negative.    Eyes: Negative.   Respiratory: Negative.    Cardiovascular: Negative.   Gastrointestinal: Negative.   Musculoskeletal: Negative.   Skin: Negative.   Neurological: Negative.   Psychiatric/Behavioral:  Positive for dysphoric mood. The patient is nervous/anxious.   All other systems reviewed and are negative.   Blood pressure (!) 144/94, pulse 70, temperature 98.3 F (36.8 C), resp. rate 18, height '6\' 2"'$  (1.88 m), weight 87.8 kg, SpO2 97 %. Physical Exam Vitals and nursing note reviewed.  Constitutional:      Appearance: He is well-developed.  HENT:     Head: Normocephalic and atraumatic.  Eyes:     Conjunctiva/sclera: Conjunctivae normal.     Pupils: Pupils are equal,  round, and reactive to light.  Cardiovascular:     Heart sounds: Normal heart sounds.  Pulmonary:     Effort: Pulmonary effort is normal.  Abdominal:     Palpations: Abdomen is soft.  Musculoskeletal:        General: Normal range of motion.     Cervical back: Normal range of motion.  Skin:    General: Skin is warm and  dry.  Neurological:     General: No focal deficit present.     Mental Status: He is alert.  Psychiatric:        Attention and Perception: Attention normal.        Mood and Affect: Mood is anxious and depressed.        Behavior: Behavior is slowed.      Assessment/Plan [place] ect  [name], MD 04/17/2022, 12:01 PM

## 2022-04-17 NOTE — Plan of Care (Signed)
  Problem: Safety: Goal: Ability to remain free from injury will improve Outcome: Progressing   Problem: Activity: Goal: Sleeping patterns will improve Outcome: Progressing   

## 2022-04-17 NOTE — Progress Notes (Signed)
Patient off unit for ECT. No current distress.

## 2022-04-17 NOTE — Anesthesia Preprocedure Evaluation (Signed)
Anesthesia Evaluation  Patient identified by MRN, date of birth, ID band Patient awake    Reviewed: Allergy & Precautions, H&P , NPO status , Patient's Chart, lab work & pertinent test results, reviewed documented beta blocker date and time   History of Anesthesia Complications (+) PONV and history of anesthetic complications  Airway Mallampati: II   Neck ROM: full    Dental  (+) Poor Dentition   Pulmonary sleep apnea and Continuous Positive Airway Pressure Ventilation ,    Pulmonary exam normal        Cardiovascular Exercise Tolerance: Poor hypertension, On Medications negative cardio ROS Normal cardiovascular exam Rhythm:regular Rate:Normal     Neuro/Psych  Headaches, PSYCHIATRIC DISORDERS Depression    GI/Hepatic negative GI ROS, Neg liver ROS,   Endo/Other  negative endocrine ROSdiabetes  Renal/GU negative Renal ROS  negative genitourinary   Musculoskeletal   Abdominal   Peds  Hematology negative hematology ROS (+)   Anesthesia Other Findings Past Medical History: No date: Cancer ([place]) No date: Depression No date: Depression No date: Diabetes mellitus without complication (HCC)     Comment:  type 2 No date: Dissection of carotid artery (HCC)     Comment:  a. 06/2015 CT Head: abnl thickening of mid-dist R               cervical ICA w/o narrowing - ? vasculitis and               thrombosed/healing dissection; b. 01/2021 Carotid U/S:               1-39% bilat ICA stenoses. No date: Eczema No date: Elevated PSA No date: Epidural hematoma (HCC)     Comment:  a. Age 81 - struck in head w/ baseball --> s/p               craniotomy. No date: Epidural hematoma (HCC) No date: 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, congenital bilateral: Hearing loss No date: Hemorrhoids 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 No date: Hypertension No date: Migraine No date: Obesity No date: PONV (postoperative nausea and  vomiting) 2020: Prostate cancer ([place]) No date: Rosacea No date: SDH (subdural hematoma) (HCC) No date: Sleep apnea No date: Spontaneous Subdural hematoma (HCC)     Comment:  a. Approx 2015 - eval in [place] - conservatively managed. Past Surgical History: No date: ANKLE ARTHROSCOPY; Left     Comment:  fracture 2007: CHOLECYSTECTOMY 1958: CRANIOTOMY     Comment:  left frontal craniotomy for epidural hematoma 04/07/2022: HIP ARTHROPLASTY; Right     Comment:  Procedure: ARTHROPLASTY BIPOLAR HIP (HEMIARTHROPLASTY);               Surgeon: Earnestine Leys, MD;  Location: [facility] ORS;                Service: Orthopedics;  Laterality: Right; 01/02/2022: INSERTION OF MESH     Comment:  Procedure: INSERTION OF MESH;  Surgeon: Herbert Pun, MD;  Location: [facility] ORS;  Service: General;; No date: LIGAMENT REPAIR; Right     Comment:  thumb No date: RETINAL DETACHMENT SURGERY 2006: TREATMENT FISTULA ANAL BMI    Body Mass Index: 24.84 kg/m     Reproductive/Obstetrics negative OB ROS                             Anesthesia Physical Anesthesia Plan  ASA: 3  Anesthesia Plan: General   Post-op Pain Management:    Induction:   PONV Risk Score and Plan:   Airway Management Planned:   Additional Equipment:   Intra-op Plan:   Post-operative Plan:   Informed Consent: I have reviewed the patients History and Physical, chart, labs and discussed the procedure including the risks, benefits and alternatives for the proposed anesthesia with the patient or authorized representative who has indicated his/her understanding and acceptance.     Dental Advisory Given  Plan Discussed with: CRNA  Anesthesia Plan Comments:         Anesthesia Quick Evaluation

## 2022-04-17 NOTE — Progress Notes (Signed)
[facility] MD Progress Note  04/17/2022 11:03 AM John [name]  MRN:  [id number] Subjective: I saw John [name] this morning before ECT.  Seemed to be in a much better mood today.  When I asked how he was feeling he said so-so.  I told him that we will consider an adjunct therapy such as Abilify or Latuda after his ECT and possibly starting it tomorrow.  Principal Problem: Severe recurrent major depression without psychotic features ([place]) Diagnosis: Principal Problem:   Severe recurrent major depression without psychotic features ([place]) Active Problems:   Paroxysmal atrial fibrillation (HCC)   Intertrochanteric fracture of right hip (HCC)  Total Time spent with patient: 15 minutes  Past Psychiatric History: Chronic recurrent major depression.  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 06/2015 CT Head: abnl thickening of mid-dist R cervical ICA w/o narrowing - ? vasculitis and thrombosed/healing dissection; b. 01/2021 Carotid U/S: 1-39% bilat ICA stenoses.   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 Age 78 - struck in head w/ baseball --> s/p craniotomy.   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 Approx 2015 - eval in [place] - conservatively managed.    Past Surgical History:  Procedure Laterality Date   ANKLE ARTHROSCOPY Left    fracture   CHOLECYSTECTOMY  2007   [place]   left frontal craniotomy for epidural hematoma   HIP ARTHROPLASTY Right 04/07/2022   Procedure: ARTHROPLASTY BIPOLAR HIP  (HEMIARTHROPLASTY);  Surgeon: Earnestine Leys, MD;  Location: [facility] ORS;  Service: Orthopedics;  Laterality: Right;   INSERTION OF MESH  01/02/2022   Procedure: INSERTION OF MESH;  Surgeon: Herbert Pun, MD;  Location: [facility] ORS;  Service: General;;   LIGAMENT REPAIR Right    thumb   RETINAL DETACHMENT SURGERY     TREATMENT FISTULA ANAL  2006   Family History:  Family History  Problem Relation Age of Onset   Osteoporosis Mother    Depression Mother    Prostate cancer Father    Hypertension Father    Diabetes Brother    Kidney disease Neg Hx     Social History:  Social History   Substance and Sexual Activity  Alcohol Use Not Currently     Social History   Substance and Sexual Activity  Drug Use No    Social History   Socioeconomic History   Marital status: Married    Spouse name: [name]   Number of children: 2   Years of education: Not on file   Highest education level: Not on file  Occupational History   Not on file  Tobacco Use   Smoking status: Never   Smokeless tobacco: Never  Vaping Use   Vaping Use: Never used  Substance and Sexual Activity   Alcohol use: Not Currently   Drug use:  No   Sexual activity: Yes    Birth control/protection: None  Other Topics Concern   Not on file  Social History Narrative   Live at home with wife.    Social Determinants of Health   Financial Resource Strain: Not on file  Food Insecurity: Not on file  Transportation Needs: Not on file  Physical Activity: Not on file  Stress: Not on file  Social Connections: Not on file   Additional Social History:                         Sleep: Fair  Appetite:  Good  Current Medications: Current Facility-Administered Medications  Medication Dose Route Frequency Provider Last Rate Last Admin   acetaminophen (TYLENOL) tablet 325-650 mg  325-650 mg Oral Q6H PRN Clapacs, [name], MD   650 mg at 04/16/22 1255   alum & mag hydroxide-simeth (MAALOX/MYLANTA) 200-200-20  MG/5ML suspension 30 mL  30 mL Oral Q4H PRN Clapacs, [name], MD       aspirin EC tablet 81 mg  81 mg Oral Daily Clapacs, [name], MD   81 mg at 04/16/22 4081   bisacodyl (DULCOLAX) suppository 10 mg  10 mg Rectal Daily PRN Clapacs, [name], MD       buPROPion (WELLBUTRIN XL) 24 hr tablet 300 mg  300 mg Oral Daily Clapacs, John T, MD   300 mg at 04/16/22 4481   docusate sodium (COLACE) capsule 100 mg  100 mg Oral BID Clapacs, John T, MD   100 mg at 04/16/22 2134   ferrous sulfate tablet 325 mg  325 mg Oral Q breakfast Clapacs, [name], MD   325 mg at 04/16/22 0902   HYDROcodone-acetaminophen (NORCO/VICODIN) 5-325 MG per tablet 1-2 tablet  1-2 tablet Oral Q4H PRN Clapacs, [name], MD   1 tablet at 04/12/22 0238   insulin glargine-yfgn (SEMGLEE) injection 5 Units  5 Units Subcutaneous BID Clapacs, [name], MD   5 Units at 04/16/22 2137   menthol-cetylpyridinium (CEPACOL) lozenge 3 mg  1 lozenge Oral PRN Clapacs, [name], MD       Or   phenol (CHLORASEPTIC) mouth spray 1 spray  1 spray Mouth/Throat PRN Clapacs, [name], MD       methocarbamol (ROBAXIN) tablet 500 mg  500 mg Oral Q6H PRN Clapacs, [name], MD       Or   methocarbamol (ROBAXIN) 500 mg in dextrose 5 % 50 mL IVPB  500 mg Intravenous Q6H PRN Clapacs, John T, MD       metoprolol tartrate (LOPRESSOR) tablet 25 mg  25 mg Oral BID Clapacs, John T, MD   25 mg at 04/16/22 2134   polyethylene glycol (MIRALAX / GLYCOLAX) packet 17 g  17 g Oral Daily Clapacs, John T, MD   17 g at 04/12/22 1016   prazosin (MINIPRESS) capsule 1 mg  1 mg Oral QHS Clapacs, John T, MD   1 mg at 04/16/22 2134   senna (SENOKOT) tablet 8.6 mg  1 tablet Oral BID Clapacs, John T, MD   8.6 mg at 04/16/22 2134   sodium phosphate (FLEET) 7-19 GM/118ML enema 1 enema  1 enema Rectal Once PRN Clapacs, [name], MD       traZODone (DESYREL) tablet 50 mg  50 mg Oral QHS PRN Clapacs, [name], MD   50 mg at 04/17/22 0122   zolpidem (AMBIEN) tablet 5 mg  5 mg Oral QHS PRN Clapacs,  [name], MD         Lab Results:  Results for orders placed or performed during the hospital encounter of 04/11/22 (from the past 48 hour(s))  Glucose, capillary     Status: Abnormal   Collection Time: 04/15/22  3:23 PM  Result Value Ref Range   Glucose-Capillary 187 (H) 70 - 99 mg/dL    Comment: Glucose reference range applies only to samples taken after fasting for at least 8 hours.  Glucose, capillary     Status: Abnormal   Collection Time: 04/15/22  9:55 PM  Result Value Ref Range   Glucose-Capillary 235 (H) 70 - 99 mg/dL    Comment: Glucose reference range applies only to samples taken after fasting for at least 8 hours.  Glucose, capillary     Status: Abnormal   Collection Time: 04/16/22  8:23 AM  Result Value Ref Range   Glucose-Capillary 118 (H) 70 - 99 mg/dL    Comment: Glucose reference range applies only to samples taken after fasting for at least 8 hours.  Glucose, capillary     Status: Abnormal   Collection Time: 04/16/22 11:13 AM  Result Value Ref Range   Glucose-Capillary 105 (H) 70 - 99 mg/dL    Comment: Glucose reference range applies only to samples taken after fasting for at least 8 hours.  Glucose, capillary     Status: Abnormal   Collection Time: 04/16/22  9:31 PM  Result Value Ref Range   Glucose-Capillary 126 (H) 70 - 99 mg/dL    Comment: Glucose reference range applies only to samples taken after fasting for at least 8 hours.    Blood Alcohol level:  Lab Results  Component Value Date   ETH <10 09/38/1829    Metabolic Disorder Labs: Lab Results  Component Value Date   HGBA1C 5.6 03/19/2022   MPG 114.02 03/19/2022   No results found for: "PROLACTIN" Lab Results  Component Value Date   CHOL 131 03/19/2022   TRIG 92 03/19/2022   HDL 33 (L) 03/19/2022   CHOLHDL 4.0 03/19/2022   VLDL 18 03/19/2022   LDLCALC 80 03/19/2022    Physical Findings: AIMS: Facial and Oral Movements Muscles of Facial Expression: None, normal Lips and Perioral Area: None, normal Jaw:  None, normal Tongue: None, normal,Extremity Movements Upper (arms, wrists, hands, fingers): None, normal Lower (legs, knees, ankles, toes): Moderate (Rt. leg & hip pain. S/P Rt. hip surgery), Trunk Movements Neck, shoulders, hips: None, normal, Overall Severity Severity of abnormal movements (highest score from questions above): Moderate (R/T to Rt. hip surgery) Incapacitation due to abnormal movements: None, normal Patient's awareness of abnormal movements (rate only patient's report): No Awareness, Dental Status Current problems with teeth and/or dentures?: No Does patient usually wear dentures?: No  CIWA:    COWS:     Musculoskeletal: Strength & Muscle Tone: within normal limits Gait & Station: unsteady Patient leans: N/A  Psychiatric Specialty Exam:  Presentation  General Appearance: Appropriate for Environment  Eye Contact:Good  Speech:Clear and Coherent  Speech Volume:Normal  Handedness:Right   Mood and Affect  Mood:Anxious (stated "a little anxious")  Affect:Congruent   Thought Process  Thought Processes:Linear; Coherent; Goal Directed  Descriptions of Associations:Intact  Orientation:Full (Time, Place and Person)  Thought Content:Logical; WDL  History of Schizophrenia/Schizoaffective disorder:No  Duration of Psychotic Symptoms:No data recorded Hallucinations:No data recorded Ideas of Reference:None  Suicidal Thoughts:No data recorded Homicidal Thoughts:No data recorded  Sensorium  Memory:Immediate Good  Judgment:Good  Insight:Good   Executive Functions  Concentration:Good  Attention Span:Good  Recall:Good  Fund of Knowledge:Good  Language:Good   Psychomotor Activity  Psychomotor Activity:No data recorded  Assets  Assets:Housing; Financial Resources/Insurance; Desire for Improvement; Communication Skills; Resilience; Social Support   Sleep  Sleep:No data recorded   Physical Exam: Physical Exam Vitals and nursing note  reviewed.  Constitutional:      Appearance: Normal appearance. He is normal weight.  Neurological:     General: No focal deficit present.     Mental Status: He is alert and oriented to person, place, and time.  Psychiatric:        Attention and Perception: Attention and perception normal.        Mood and Affect: Mood is depressed. Affect is flat.        Speech: Speech normal.        Behavior: Behavior normal. Behavior is cooperative.        Thought Content: Thought content normal.        Cognition and Memory: Cognition and memory normal.        Judgment: Judgment normal.    Review of Systems  Constitutional: Negative.   HENT: Negative.    Eyes: Negative.   Respiratory: Negative.    Cardiovascular: Negative.   Gastrointestinal: Negative.   Genitourinary: Negative.   Musculoskeletal: Negative.   Skin: Negative.   Neurological: Negative.   Endo/Heme/Allergies: Negative.   Psychiatric/Behavioral:  Positive for depression. The patient is nervous/anxious.    Blood pressure (!) 144/94, pulse 70, temperature 98.3 F (36.8 C), resp. rate 18, height '6\' 2"'$  (1.88 m), weight 87.8 kg, SpO2 97 %. Body mass index is 24.84 kg/m.   Treatment Plan Summary: Daily contact with patient to assess and evaluate symptoms and progress in treatment, Medication management, and Plan ECT this morning and consider adjunct antidepressant therapy starting tomorrow.  [place], DO 04/17/2022, 11:03 AM

## 2022-04-17 NOTE — Transfer of Care (Signed)
Immediate Anesthesia Transfer of Care Note  Patient: John [name]  Procedure(s) Performed: ECT TX  Patient Location: PACU  Anesthesia Type:General  Level of Consciousness: drowsy  Airway & Oxygen Therapy: Patient Spontanous Breathing and Patient connected to face mask oxygen  Post-op Assessment: Report given to RN and Post -op Vital signs reviewed and stable  Post vital signs: Reviewed and stable  Last Vitals:  Vitals Value Taken Time  BP    Temp    Pulse    Resp    SpO2      Last Pain:  Vitals:   04/17/22 0800  TempSrc:   PainSc: 0-No pain         Complications: No notable events documented.

## 2022-04-17 NOTE — Progress Notes (Signed)
Occupational Therapy Treatment Patient Details Name: John [name] MRN: [id number] DOB: 12/17/1940 Today's Date: 04/17/2022   History of present illness Pt is a 81 yo male with PMH of depression, anxiety, HTN, DM, OSA, prostate cancer, Horner syndrome, dissection of carotid artery, subdural hematoma, epidural hematoma. Previously hospitalized from 6/5-6/8 for severe depression and found to have abnormal EKG with A-fib. Pt receiving ECT for management of depression. Pt admitted 6/10 due to R femoral neck fx due to a fall in the  bathroom on the evening of 6/9. Pt underwent R hip hemiarthroplasty on 6/11. Pt discharged from inpatient unit and now admitted to [facility] psych unit. Evaluation performed 04/14/22 to resume OT services.   OT comments  John [name] demonstrates a remarkable improvement in engagement and interest since last session and following today's ECT tx. In previous sessions he has demonstrated delayed speech, usually in short, clipped sentences, with little introspection and great difficulty in making decisions. During today's session, in contrast, John [name] showed a high degree of introspection, was quite talkative. Asked to list activities he enjoyed doing (this therapist had had asked the pt a similar question last week, and he had considerable difficulty thinking of anything that brought him pleasure), John [name] described in great detail his involvement in musical activities (singing in two community choirs, playing the trombone), social/outdoor activities (participating in a monthly [place] group, taking an annual multi-day trip to the [place] to observe wildlife, tracking and tending to 50 bluebird nests in the area), and amateur radio activities (building/repairing radios, communicating via [place] code w/ other enthusiasts), etc. Pt stated that his sleep has improved recently and that he has been having "vivid" dreams, although said that some of these dreams were "gruesome." For the first  time since working with this therapist, pt voiced hopeful visions of his life post DC. His physical abilities also continue to improve, with decreased pain, better standing balance, and great ROM in R hip. Will continue to follow PoC.    Recommendations for follow up therapy are one component of a multi-disciplinary discharge planning process, led by the attending physician.  Recommendations may be updated based on patient status, additional functional criteria and insurance authorization.    Follow Up Recommendations  No OT follow up    Assistance Recommended at Discharge Intermittent Supervision/Assistance  Patient can return home with the following  A little help with walking and/or transfers;A little help with bathing/dressing/bathroom   Equipment Recommendations       Recommendations for Other Services Other (comment) (regular outpt therapy for mental health concerns)    Precautions / Restrictions Precautions Precautions: Posterior Hip;Fall Restrictions Weight Bearing Restrictions: Yes RLE Weight Bearing: Partial weight bearing RLE Partial Weight Bearing Percentage or Pounds: 50       Mobility Bed Mobility Overal bed mobility: Needs Assistance Bed Mobility: Supine to Sit       Sit to supine: Supervision   General bed mobility comments: increased time and effort required    Transfers                         Balance                                           ADL either performed or assessed with clinical judgement   ADL  Extremity/Trunk Assessment Upper Extremity Assessment Upper Extremity Assessment: Overall WFL for tasks assessed   Lower Extremity Assessment Lower Extremity Assessment: RLE deficits/detail RLE Deficits / Details: s/p mechanical fall and R hip fx last week RLE Sensation: WNL   Cervical / Trunk Assessment Cervical / Trunk Assessment: Normal     Vision       Perception     Praxis      Cognition Arousal/Alertness: Awake/alert Behavior During Therapy: WFL for tasks assessed/performed, Flat affect Overall Cognitive Status: Within Functional Limits for tasks assessed                                 General Comments: much more alert and engaged this day        Exercises Other Exercises Other Exercises: Educ re: importance of engagement in meaningful occupations, acceptance of limitations, medication mgmt    Shoulder Instructions       General Comments      Pertinent Vitals/ Pain       Pain Assessment Pain Assessment: 0-10 Pain Score: 2  Faces Pain Scale: Hurts a little bit Pain Intervention(s): Monitored during session  Home Living                                          Prior Functioning/Environment              Frequency  Min 2X/week        Progress Toward Goals  OT Goals(current goals can now be found in the care plan section)  Progress towards OT goals: Progressing toward goals  Acute Rehab OT Goals OT Goal Formulation: With patient Time For Goal Achievement: 04/28/22 Potential to Achieve Goals: Good  Plan Discharge plan remains appropriate;Frequency remains appropriate    Co-evaluation                 AM-PAC OT "6 Clicks" Daily Activity     Outcome Measure   Help from another person eating meals?: None Help from another person taking care of personal grooming?: None Help from another person toileting, which includes using toliet, bedpan, or urinal?: A Little Help from another person bathing (including washing, rinsing, drying)?: A Little Help from another person to put on and taking off regular upper body clothing?: None Help from another person to put on and taking off regular lower body clothing?: A Little 6 Click Score: 21    End of Session Equipment Utilized During Treatment: Rolling walker (2 wheels)  OT Visit Diagnosis:  Unsteadiness on feet (R26.81);Muscle weakness (generalized) (M62.81) Pain - Right/Left: Right Pain - part of body: Hip   Activity Tolerance Patient tolerated treatment well   Patient Left in bed;with call bell/phone within reach   Nurse Communication          Time: 7564-3329 OT Time Calculation (min): 55 min  Charges: OT General Charges $OT Visit: 1 Visit OT Treatments $Self Care/Home Management : 53-67 mins  Josiah Lobo, PhD, MS, OTR/L 04/17/22, 4:27 PM

## 2022-04-17 NOTE — BH IP Treatment Plan (Signed)
Interdisciplinary Treatment and Diagnostic Plan Update  04/17/2022 Time of Session: 9:30AM John [name] MRN: [id number]  Principal Diagnosis: Severe recurrent major depression without psychotic features [facility])  Secondary Diagnoses: Principal Problem:   Severe recurrent major depression without psychotic features ([place]) Active Problems:   Paroxysmal atrial fibrillation (HCC)   Intertrochanteric fracture of right hip (HCC)   Current Medications:  Current Facility-Administered Medications  Medication Dose Route Frequency Provider Last Rate Last Admin   acetaminophen (TYLENOL) tablet 325-650 mg  325-650 mg Oral Q6H PRN Clapacs, [name], MD   650 mg at 04/16/22 1255   alum & mag hydroxide-simeth (MAALOX/MYLANTA) 200-200-20 MG/5ML suspension 30 mL  30 mL Oral Q4H PRN Clapacs, [name], MD       aspirin EC tablet 81 mg  81 mg Oral Daily Clapacs, [name], MD   81 mg at 04/16/22 3016   bisacodyl (DULCOLAX) suppository 10 mg  10 mg Rectal Daily PRN Clapacs, [name], MD       buPROPion (WELLBUTRIN XL) 24 hr tablet 300 mg  300 mg Oral Daily Clapacs, John T, MD   300 mg at 04/16/22 0109   docusate sodium (COLACE) capsule 100 mg  100 mg Oral BID Clapacs, John T, MD   100 mg at 04/16/22 2134   ferrous sulfate tablet 325 mg  325 mg Oral Q breakfast Clapacs, [name], MD   325 mg at 04/16/22 3235   HYDROcodone-acetaminophen (NORCO/VICODIN) 5-325 MG per tablet 1-2 tablet  1-2 tablet Oral Q4H PRN Clapacs, [name], MD   1 tablet at 04/12/22 0238   insulin glargine-yfgn (SEMGLEE) injection 5 Units  5 Units Subcutaneous BID Clapacs, [name], MD   5 Units at 04/16/22 2137   menthol-cetylpyridinium (CEPACOL) lozenge 3 mg  1 lozenge Oral PRN Clapacs, [name], MD       Or   phenol (CHLORASEPTIC) mouth spray 1 spray  1 spray Mouth/Throat PRN Clapacs, [name], MD       methocarbamol (ROBAXIN) tablet 500 mg  500 mg Oral Q6H PRN Clapacs, [name], MD       Or   methocarbamol (ROBAXIN) 500 mg in dextrose 5 % 50 mL IVPB  500 mg  Intravenous Q6H PRN Clapacs, John T, MD       metoprolol tartrate (LOPRESSOR) tablet 25 mg  25 mg Oral BID Clapacs, John T, MD   25 mg at 04/16/22 2134   polyethylene glycol (MIRALAX / GLYCOLAX) packet 17 g  17 g Oral Daily Clapacs, John T, MD   17 g at 04/12/22 1016   prazosin (MINIPRESS) capsule 1 mg  1 mg Oral QHS Clapacs, John T, MD   1 mg at 04/16/22 2134   senna (SENOKOT) tablet 8.6 mg  1 tablet Oral BID Clapacs, John T, MD   8.6 mg at 04/16/22 2134   sodium phosphate (FLEET) 7-19 GM/118ML enema 1 enema  1 enema Rectal Once PRN Clapacs, [name], MD       traZODone (DESYREL) tablet 50 mg  50 mg Oral QHS PRN Clapacs, John T, MD   50 mg at 04/17/22 0122   zolpidem (AMBIEN) tablet 5 mg  5 mg Oral QHS PRN Clapacs, [name], MD       PTA Medications: Medications Prior to Admission  Medication Sig Dispense Refill Last Dose   alum & mag hydroxide-simeth (MAALOX/MYLANTA) 200-200-20 MG/5ML suspension Take 30 mLs by mouth every 4 (four) hours as needed for indigestion. 355 mL 0  aspirin EC 81 MG tablet Take 81 mg by mouth daily.      buPROPion (WELLBUTRIN XL) 150 MG 24 hr tablet Take 1 tablet (150 mg total) by mouth daily. 30 tablet 0    docusate sodium (COLACE) 100 MG capsule Take 1 capsule (100 mg total) by mouth 2 (two) times daily. 10 capsule 0    ferrous sulfate 325 (65 FE) MG tablet Take 1 tablet (325 mg total) by mouth daily with breakfast.  3    HYDROcodone-acetaminophen (NORCO/VICODIN) 5-325 MG tablet Take 1-2 tablets by mouth every 4 (four) hours as needed for moderate pain (pain score 4-6). 30 tablet 0    insulin aspart (NOVOLOG) 100 UNIT/ML injection Inject 0-9 Units into the skin 3 (three) times daily with meals. 10 mL 11    insulin glargine-yfgn (SEMGLEE) 100 UNIT/ML injection Inject 0.08 mLs (8 Units total) into the skin 2 (two) times daily. 10 mL 11    LORazepam (ATIVAN) 1 MG tablet Take 1 tablet (1 mg total) by mouth every 4 (four) hours as needed for anxiety, sedation or sleep. 30  tablet 0    methocarbamol (ROBAXIN) 500 MG tablet Take 1 tablet (500 mg total) by mouth every 6 (six) hours as needed for muscle spasms.      metoprolol tartrate (LOPRESSOR) 25 MG tablet Take 1 tablet (25 mg total) by mouth 2 (two) times daily. 60 tablet 0    polyethylene glycol (MIRALAX / GLYCOLAX) 17 g packet Take 17 g by mouth daily as needed. 14 each 0    traZODone (DESYREL) 100 MG tablet Take 1 tablet (100 mg total) by mouth at bedtime as needed for sleep. 30 tablet 0     Patient Stressors: Health problems   Loss of full ambulatory function prior to surgery   Medication change or noncompliance    Patient Strengths: Hydrographic surveyor for treatment/growth  Supportive family/friends   Treatment Modalities: Medication Management, Group therapy, Case management,  1 to 1 session with clinician, Psychoeducation, Recreational therapy.   Physician Treatment Plan for Primary Diagnosis: Severe recurrent major depression without psychotic features ([place]) Long Term Goal(s): Improvement in symptoms so as ready for discharge   Short Term Goals: Ability to identify and develop effective coping behaviors will improve Ability to maintain clinical measurements within normal limits will improve Ability to verbalize feelings will improve Ability to disclose and discuss suicidal ideas Ability to demonstrate self-control will improve  Medication Management: Evaluate patient's response, side effects, and tolerance of medication regimen.  Therapeutic Interventions: 1 to 1 sessions, Unit Group sessions and Medication administration.  Evaluation of Outcomes: Not Progressing  Physician Treatment Plan for Secondary Diagnosis: Principal Problem:   Severe recurrent major depression without psychotic features ([place]) Active Problems:   Paroxysmal atrial fibrillation (HCC)   Intertrochanteric fracture of right hip (HCC)  Long Term Goal(s): Improvement in symptoms so as ready for discharge    Short Term Goals: Ability to identify and develop effective coping behaviors will improve Ability to maintain clinical measurements within normal limits will improve Ability to verbalize feelings will improve Ability to disclose and discuss suicidal ideas Ability to demonstrate self-control will improve     Medication Management: Evaluate patient's response, side effects, and tolerance of medication regimen.  Therapeutic Interventions: 1 to 1 sessions, Unit Group sessions and Medication administration.  Evaluation of Outcomes: Not Progressing   RN Treatment Plan for Primary Diagnosis: Severe recurrent major depression without psychotic features ([place]) Long Term Goal(s): Knowledge of disease and  therapeutic regimen to maintain health will improve  Short Term Goals: Ability to remain free from injury will improve, Ability to verbalize frustration and anger appropriately will improve, Ability to demonstrate self-control, Ability to participate in decision making will improve, Ability to verbalize feelings will improve, Ability to identify and develop effective coping behaviors will improve, and Compliance with prescribed medications will improve  Medication Management: RN will administer medications as ordered by provider, will assess and evaluate patient's response and provide education to patient for prescribed medication. RN will report any adverse and/or side effects to prescribing provider.  Therapeutic Interventions: 1 on 1 counseling sessions, Psychoeducation, Medication administration, Evaluate responses to treatment, Monitor vital signs and CBGs as ordered, Perform/monitor CIWA, COWS, AIMS and Fall Risk screenings as ordered, Perform wound care treatments as ordered.  Evaluation of Outcomes: Not Progressing   LCSW Treatment Plan for Primary Diagnosis: Severe recurrent major depression without psychotic features ([place]) Long Term Goal(s): Safe transition to appropriate next level of  care at discharge, Engage patient in therapeutic group addressing interpersonal concerns.  Short Term Goals: Engage patient in aftercare planning with referrals and resources, Increase social support, Increase ability to appropriately verbalize feelings, Increase emotional regulation, Identify triggers associated with mental health/substance abuse issues, and Increase skills for wellness and recovery  Therapeutic Interventions: Assess for all discharge needs, 1 to 1 time with Social worker, Explore available resources and support systems, Assess for adequacy in community support network, Educate family and significant other(s) on suicide prevention, Complete Psychosocial Assessment, Interpersonal group therapy.  Evaluation of Outcomes: Not Progressing   Progress in Treatment: Attending groups: No. Participating in groups: No. Taking medication as prescribed: Yes. Toleration medication: Yes. Family/Significant other contact made: No, will contact:  SPE completed with pt. CSW made attempts for SPE with pt's partner, Lorin Gawron Patient understands diagnosis: Yes. Discussing patient identified problems/goals with staff: Yes. Medical problems stabilized or resolved: Yes. Denies suicidal/homicidal ideation: Yes. Issues/concerns per patient self-inventory: No. Other: None  New problem(s) identified: No, Describe:  None  New Short Term/Long Term Goal(s): Patient to work towards medication management for mood stabilization;  development of comprehensive mental wellness plan. Update 04/17/22: No changes at this time.      Patient Goals:  "work on my self-esteem" Update 04/17/22: No changes at this time.      Discharge Plan or Barriers: CSW will assist pt with development of appropriate discharge/aftercare plan. Update 04/17/22: No changes at this time.    Reason for Continuation of Hospitalization: Anxiety Depression Medication stabilization   Estimated Length of Stay: TBD  Last [place]  Suicide Severity Risk Score: [place] Admission (Current) from 04/11/2022 in [place] Admission (Discharged) from 04/06/2022 in [place] (1A) Admission (Discharged) from 04/04/2022 in [place] Error: Question 6 not populated No Risk No Risk       Last PHQ 2/9 Scores:    12/21/2021    8:59 AM 04/22/2017   10:46 AM  Depression screen PHQ 2/9  Decreased Interest 0 0  Down, Depressed, Hopeless 0 0  PHQ - 2 Score 0 0    Scribe for Treatment Team: Semisi Biela A [place], [place] 04/17/2022 11:10 AM

## 2022-04-17 NOTE — Anesthesia Procedure Notes (Signed)
Procedure Name: General with mask airway Date/Time: 04/17/2022 1:46 PM  Performed by: Tollie Eth, CRNAPre-anesthesia Checklist: Patient identified, Emergency Drugs available, Suction available and Patient being monitored Patient Re-evaluated:Patient Re-evaluated prior to induction Oxygen Delivery Method: Circle system utilized Preoxygenation: Pre-oxygenation with 100% oxygen Induction Type: IV induction Ventilation: Mask ventilation without difficulty and Mask ventilation throughout procedure Airway Equipment and Method: Bite block Placement Confirmation: positive ETCO2 Dental Injury: Teeth and Oropharynx as per pre-operative assessment

## 2022-04-17 NOTE — Progress Notes (Signed)
Patient assisted with showering. Patient moderate assist due to R hip surgery. Patient absent from injuries/falls.

## 2022-04-17 NOTE — Progress Notes (Signed)
BP 134/79 (BP Location: Right Arm)   Pulse 64   Temp (!) 97.4 F (36.3 C) (Oral)   Resp 20   Ht '6\' 2"'$  (1.88 m)   Wt 87.8 kg   SpO2 98%   BMI 24.84 kg/m    Patient A&O. Patient currently denies any discomfort with the exception of his R hip. Patient given scheduled medications along with tylenol po prn. Patient provided with snack and drinks while awaiting lunch tray. No s/s of current distress.

## 2022-04-17 NOTE — Procedures (Signed)
ECT SERVICES Physician's Interval Evaluation & Treatment Note  Patient Identification: John [name] MRN:  [id number] Date of Evaluation:  04/17/2022 TX #: [id number]  MADRS:   MMSE:   P.E. Findings:  No change to physical exam  Psychiatric Interval Note:  Continues to be depressed and withdrawn  Subjective:  Patient is a 81 y.o. male seen for evaluation for Electroconvulsive Therapy. No psychotic symptoms depressed but no active suicidal thoughts  Treatment Summary:   '[x]'$   Right Unilateral             '[]'$  Bilateral   % Energy : 0.3 ms 90%   Impedance: 1680 ohms  Seizure Energy Index: 3370 V squared  Postictal Suppression Index: 41%  Seizure Concordance Index: 78%  Medications  Pre Shock: Robinul 0.1 mg Toradol 30 mg labetalol 10 mg Brevital 90 mg succinylcholine 100 mg  Post Shock: Versed 2 mg  Seizure Duration: 10 seconds EMG 26 seconds EEG   Comments: Next treatment Friday go up to 100 mg  Lungs:  '[x]'$   Clear to auscultation               '[]'$  Other:   Heart:    '[x]'$   Regular rhythm             '[]'$  irregular rhythm    '[x]'$   Previous H&P reviewed, patient examined and there are NO CHANGES                 '[]'$   Previous H&P reviewed, patient examined and there are changes noted.   [name], MD 6/21/20235:15 PM

## 2022-04-17 NOTE — Progress Notes (Signed)
Physical Therapy Treatment Patient Details Name: John [name] MRN: [id number] DOB: [birth year] Today's Date: 04/17/2022   History of Present Illness Pt is a 81 yo male with PMH of depression, anxiety, HTN, DM, OSA, prostate cancer, Horner syndrome, dissection of carotid artery, subdural hematoma, epidural hematoma. Previously hospitalized from 6/5-6/8 for severe depression and found to have abnormal EKG with A-fib. Pt receiving ECT for management of depression. Pt admitted 6/10 due to R femoral neck fx due to a fall in the  bathroom on the evening of 6/9. Pt underwent R hip hemiarthroplasty on 6/11. Pt discharged from inpatient unit and now admitted to [facility] psych unit. Evaluation performed 04/14/22 to resume OT services.    PT Comments    Pt making progress toward PT goals. Pt reported he has not been completing exercises between PT sessions. Pt provided with HEP booklet with AM/PM exercises highlighted and pt encouraged to complete AM exercises when he wakes up in the morning and PM exercises prior to sleeping. Pt ambulated 110 feet with RW and CGA with verbal cues required for proper WB status and for maintaining the RW at appropriate distance. Pt was able to correct RW placement this date, but may benefit from use of a metronome to improve gait speed. Recommend continued PT to maximize independence and function through strengthening and facilitation of gait mechanics.   Recommendations for follow up therapy are one component of a multi-disciplinary discharge planning process, led by the attending physician.  Recommendations may be updated based on patient status, additional functional criteria and insurance authorization.  Follow Up Recommendations  Follow physician's recommendations for discharge plan and follow up therapies     Assistance Recommended at Discharge Frequent or constant Supervision/Assistance  Patient can return home with the following A little help with walking and/or transfers;A  little help with bathing/dressing/bathroom   Equipment Recommendations  Rolling walker (2 wheels);BSC/3in1    Recommendations for Other Services       Precautions / Restrictions Precautions Precautions: Posterior Hip;Fall Precaution Booklet Issued: Yes (comment) (highlighted AM/PM exercises to ease pt decision making) Restrictions Weight Bearing Restrictions: Yes RLE Weight Bearing: Partial weight bearing RLE Partial Weight Bearing Percentage or Pounds: 50     Mobility  Bed Mobility Overal bed mobility: Needs Assistance Bed Mobility: Supine to Sit     Supine to sit: Supervision     General bed mobility comments: increased time and effort required    Transfers Overall transfer level: Needs assistance Equipment used: Rolling walker (2 wheels) Transfers: Sit to/from Stand Sit to Stand: Supervision           General transfer comment: verbal cues required for UE placement this morning as pt reached for RW with bilat UEs initially    Ambulation/Gait Ambulation/Gait assistance: Min guard Gait Distance (Feet): 110 Feet Assistive device: Rolling walker (2 wheels) Gait Pattern/deviations: Decreased stance time - right, Decreased stride length, Step-to pattern, Step-through pattern, Decreased weight shift to right Gait velocity: decreased     General Gait Details: initially step to pattern with no WB through R LE; verbal cues required for [facility] with RW at appropriate distance; pt able to correct with verbal cues and maintained continuous gait pattern at appropriate distance, however, decreased gait speed   Stairs             Wheelchair Mobility    Modified Rankin (Stroke Patients Only)       Balance Overall balance assessment: Needs assistance Sitting-balance support: Feet supported, No upper extremity supported Sitting  balance-Leahy Scale: Good Sitting balance - Comments: Sat EOB back unsupported, no UE support and feet on floor. Reaching for RW; completed  LAQs sitting EOB without LOB.   Standing balance support: Reliant on assistive device for balance, Bilateral upper extremity supported Standing balance-Leahy Scale: Fair Standing balance comment: Pt ambulated with RW. Able to take 1 UE off RW in standing without LOB.                            Cognition Arousal/Alertness: Awake/alert Behavior During Therapy: WFL for tasks assessed/performed, Flat affect Overall Cognitive Status: Within Functional Limits for tasks assessed                                 General Comments: pt followed single step commands, but slow to speak and process; pt required assistance with reciting precautions today, forgot ADD past midline precaution.        Exercises Total Joint Exercises Ankle Circles/Pumps: AROM, Strengthening, Right, 10 reps Gluteal Sets: AROM, Both, Supine, Other reps (comment), 10 reps (10x3" hold) Short Arc Quad: AROM, Strengthening, Supine, Right, Other reps (comment) (2x10) Heel Slides: AROM, Strengthening, Right, Supine, 15 reps Long Arc Quad: AROM, Strengthening, Right, Other reps (comment), Seated (2x10 reps seated EOB, bilat UE support on bed)    General Comments        Pertinent Vitals/Pain Pain Assessment Pain Assessment: 0-10 Pain Score: 1  Pain Location: R hip Pain Descriptors / Indicators: Discomfort Pain Intervention(s): Limited activity within patient's tolerance, Monitored during session, Repositioned    Home Living                          Prior Function            PT Goals (current goals can now be found in the care plan section) Acute Rehab PT Goals Patient Stated Goal: to go home PT Goal Formulation: With patient Time For Goal Achievement: 04/27/22 Potential to Achieve Goals: Good Progress towards PT goals: Progressing toward goals    Frequency    7X/week      PT Plan Current plan remains appropriate    Co-evaluation              AM-PAC PT "6  Clicks" Mobility   Outcome Measure  Help needed turning from your back to your side while in a flat bed without using bedrails?: A Little Help needed moving from lying on your back to sitting on the side of a flat bed without using bedrails?: A Little Help needed moving to and from a bed to a chair (including a wheelchair)?: A Little Help needed standing up from a chair using your arms (e.g., wheelchair or bedside chair)?: A Little Help needed to walk in hospital room?: A Little Help needed climbing 3-5 steps with a railing? : A Lot 6 Click Score: 17    End of Session Equipment Utilized During Treatment: Gait belt Activity Tolerance: Patient tolerated treatment well Patient left: in bed;with nursing/sitter in room;with call bell/phone within reach (telesitter in pt room) Nurse Communication: Mobility status PT Visit Diagnosis: Muscle weakness (generalized) (M62.81);Difficulty in walking, not elsewhere classified (R26.2);Unsteadiness on feet (R26.81) Pain - Right/Left: Right Pain - part of body: Hip     Time: 9518-8416 PT Time Calculation (min) (ACUTE ONLY): 24 min  Charges:  Saleem Coccia, SPT  Ashely Goosby 04/17/2022, 10:59 AM

## 2022-04-18 ENCOUNTER — Other Ambulatory Visit: Payer: Self-pay | Admitting: Psychiatry

## 2022-04-18 ENCOUNTER — Ambulatory Visit: Payer: Medicare Other | Admitting: Psychiatry

## 2022-04-18 DIAGNOSIS — F332 Major depressive disorder, recurrent severe without psychotic features: Secondary | ICD-10-CM | POA: Diagnosis not present

## 2022-04-18 LAB — GLUCOSE, CAPILLARY
Glucose-Capillary: 125 mg/dL — ABNORMAL HIGH (ref 70–99)
Glucose-Capillary: 167 mg/dL — ABNORMAL HIGH (ref 70–99)

## 2022-04-18 MED ORDER — ARIPIPRAZOLE 2 MG PO TABS
2.0000 mg | ORAL_TABLET | Freq: Every day | ORAL | Status: DC
Start: 2022-04-19 — End: 2022-04-23
  Administered 2022-04-19 – 2022-04-23 (×5): 2 mg via ORAL
  Filled 2022-04-18 (×5): qty 1

## 2022-04-18 NOTE — Progress Notes (Signed)
Pt slow to respond/ reports headache pain (7) - req pain meds. Compliant of feeling dizzy when stands up . / mouth was parched (dehydrated) , gave Pt water/gatorade. Notified RN

## 2022-04-18 NOTE — Plan of Care (Signed)
  Problem: Safety: Goal: Ability to remain free from injury will improve Outcome: Progressing   Problem: Health Behavior/Discharge Planning: Goal: Compliance with treatment plan for underlying cause of condition will improve Outcome: Progressing   Problem: Health Behavior/Discharge Planning: Goal: Ability to make decisions will improve Outcome: Progressing

## 2022-04-18 NOTE — Plan of Care (Signed)
Patient is alert and oriented, calm and cooperative during assessment. Patient denies SI, HI, and AVH. Patient denies depression and anxiety. Complain of right hip pain and medicated with PRN Tylenol. Toileted x1 and assisted back to bed. With tele sitter in place. Patient is currently in bed resting in no apparent distress at this time. Q15 minute safety check continue.  Problem: Education: Goal: Knowledge of General Education information will improve Description: Including pain rating scale, medication(s)/side effects and non-pharmacologic comfort measures Outcome: Progressing   Problem: Health Behavior/Discharge Planning: Goal: Ability to manage health-related needs will improve Outcome: Progressing   Problem: Clinical Measurements: Goal: Ability to maintain clinical measurements within normal limits will improve Outcome: Progressing   Problem: Clinical Measurements: Goal: Will remain free from infection Outcome: Progressing   Problem: Activity: Goal: Risk for activity intolerance will decrease Outcome: Progressing   Problem: Safety: Goal: Ability to remain free from injury will improve Outcome: Progressing   Problem: Education: Goal: Emotional status will improve Outcome: Progressing

## 2022-04-18 NOTE — Progress Notes (Signed)
Patient with flat affect and lower energy than yesterday today. Patient denies SI,HI, and A/V/H but clearly still depressed. Patient more sociable yesterday than today. Patient with tele-sitter by bedside when in room but could have more benefit with an actual in person sitter by bedside. Patient requires assistance throughout day when changing clothes, going to bathroom, getting in and out of bed, and showering due to R hip surgery. Patient uses call bell adequately for assistance. Patient cooperative and med compliant. Patient verbalized pain this morning 7 out of 10 and offered hydrocodone po prn for his pain but patient refused pain medication and continued to refuse pain medication throughout day. Patient out of room for meals and now resting back in bed. Wound assessed and dressing changed. Clean, dry, intact. No s/s of current distress.

## 2022-04-18 NOTE — Consult Note (Signed)
  Follow-up 81 year old man for ECT.  Patient seen today.  Told me he was feeling very bad.  Said he felt like he wanted to "not continue".  Tried to clarify with him what he meant from this but he seems so depressed I think it is clear he means he is having suicidal thoughts.  Very blunted and down and anxious in his affect.  Plan is to continue ECT next treatment tomorrow.

## 2022-04-19 ENCOUNTER — Inpatient Hospital Stay: Payer: Medicare Other | Admitting: Certified Registered"

## 2022-04-19 ENCOUNTER — Encounter: Payer: Self-pay | Admitting: Psychiatry

## 2022-04-19 DIAGNOSIS — F418 Other specified anxiety disorders: Secondary | ICD-10-CM | POA: Diagnosis not present

## 2022-04-19 DIAGNOSIS — F332 Major depressive disorder, recurrent severe without psychotic features: Secondary | ICD-10-CM | POA: Diagnosis not present

## 2022-04-19 LAB — GLUCOSE, CAPILLARY
Glucose-Capillary: 93 mg/dL (ref 70–99)
Glucose-Capillary: 122 mg/dL — ABNORMAL HIGH (ref 70–99)

## 2022-04-19 MED ORDER — KETOROLAC TROMETHAMINE 30 MG/ML IJ SOLN
30.0000 mg | Freq: Once | INTRAMUSCULAR | Status: AC
  Administered 2022-04-19: 30 mg via INTRAVENOUS

## 2022-04-19 MED ORDER — METHOHEXITAL SODIUM 100 MG/10ML IV SOSY
PREFILLED_SYRINGE | INTRAVENOUS | Status: DC | PRN
  Administered 2022-04-19: 90 mg via INTRAVENOUS

## 2022-04-19 MED ORDER — KETOROLAC TROMETHAMINE 30 MG/ML IJ SOLN
INTRAMUSCULAR | Status: AC
  Filled 2022-04-19: qty 1

## 2022-04-19 MED ORDER — ONDANSETRON HCL 4 MG/2ML IJ SOLN: 4.0000 mg | Freq: Once | INTRAMUSCULAR | Status: DC | PRN

## 2022-04-19 MED ORDER — CEFAZOLIN SODIUM-DEXTROSE 2-4 GM/100ML-% IV SOLN
INTRAVENOUS | Status: AC
  Filled 2022-04-19: qty 100

## 2022-04-19 MED ORDER — MIDAZOLAM HCL 2 MG/2ML IJ SOLN
2.0000 mg | Freq: Once | INTRAMUSCULAR | Status: AC
  Administered 2022-04-19: 2 mg via INTRAVENOUS

## 2022-04-19 MED ORDER — METHOHEXITAL SODIUM 0.5 G IJ SOLR
INTRAMUSCULAR | Status: AC
  Filled 2022-04-19: qty 500

## 2022-04-19 MED ORDER — SUCCINYLCHOLINE CHLORIDE 200 MG/10ML IV SOSY
PREFILLED_SYRINGE | INTRAVENOUS | Status: DC | PRN
  Administered 2022-04-19: 100 mg via INTRAVENOUS

## 2022-04-19 MED ORDER — FENTANYL CITRATE PF 50 MCG/ML IJ SOSY: 25.0000 ug | PREFILLED_SYRINGE | INTRAMUSCULAR | Status: DC | PRN

## 2022-04-19 MED ORDER — SODIUM CHLORIDE 0.9 % IV SOLN
500.0000 mL | Freq: Once | INTRAVENOUS | Status: AC
  Administered 2022-04-19: 500 mL via INTRAVENOUS

## 2022-04-19 MED ORDER — MIDAZOLAM HCL 2 MG/2ML IJ SOLN
INTRAMUSCULAR | Status: AC
  Filled 2022-04-19: qty 2

## 2022-04-19 MED ORDER — SODIUM CHLORIDE 0.9 % IV SOLN: INTRAVENOUS | Status: DC | PRN

## 2022-04-19 NOTE — Anesthesia Postprocedure Evaluation (Signed)
Anesthesia Post Note  Patient: John [name]  Procedure(s) Performed: ECT TX  Patient location during evaluation: PACU Anesthesia Type: General Level of consciousness: awake Pain management: pain level controlled Vital Signs Assessment: post-procedure vital signs reviewed and stable Respiratory status: respiratory function stable and nonlabored ventilation Cardiovascular status: stable Anesthetic complications: no   No notable events documented.   Last Vitals:  Vitals:   04/19/22 1320 04/19/22 1330  BP: (!) 126/94   Pulse: 68 68  Resp: (!) 22 18  Temp:  36.7 C  SpO2: 100% 100%    Last Pain:  Vitals:   04/19/22 1330  TempSrc:   PainSc: 0-No pain                 VAN STAVEREN,Dairon Procter

## 2022-04-19 NOTE — Plan of Care (Signed)
Currently in ECT. Cooperative and calm on the unit. Appears flat and depressed, pensive. Denied SI/HI. Safety monitored per department protocol.

## 2022-04-20 DIAGNOSIS — F332 Major depressive disorder, recurrent severe without psychotic features: Secondary | ICD-10-CM | POA: Diagnosis not present

## 2022-04-20 LAB — GLUCOSE, CAPILLARY
Glucose-Capillary: 123 mg/dL — ABNORMAL HIGH (ref 70–99)
Glucose-Capillary: 112 mg/dL — ABNORMAL HIGH (ref 70–99)
Glucose-Capillary: 114 mg/dL — ABNORMAL HIGH (ref 70–99)
Glucose-Capillary: 125 mg/dL — ABNORMAL HIGH (ref 70–99)

## 2022-04-20 NOTE — Progress Notes (Signed)
Patient assisted to the bathroom and then back to bed.

## 2022-04-21 DIAGNOSIS — F332 Major depressive disorder, recurrent severe without psychotic features: Secondary | ICD-10-CM | POA: Diagnosis not present

## 2022-04-21 LAB — GLUCOSE, CAPILLARY
Glucose-Capillary: 109 mg/dL — ABNORMAL HIGH (ref 70–99)
Glucose-Capillary: 167 mg/dL — ABNORMAL HIGH (ref 70–99)

## 2022-04-21 NOTE — Progress Notes (Signed)
Patient had an incontinent episode. Incontinent care rendered. Patient assisted into clean dry scrub and then back to bed.

## 2022-04-21 NOTE — Plan of Care (Signed)
  Problem: Education: Goal: Knowledge of General Education information will improve Description: Including pain rating scale, medication(s)/side effects and non-pharmacologic comfort measures Outcome: Progressing   Problem: Health Behavior/Discharge Planning: Goal: Ability to manage health-related needs will improve Outcome: Progressing   Problem: Clinical Measurements: Goal: Ability to maintain clinical measurements within normal limits will improve Outcome: Progressing Goal: Will remain free from infection Outcome: Progressing Goal: Diagnostic test results will improve Outcome: Progressing Goal: Respiratory complications will improve Outcome: Progressing Goal: Cardiovascular complication will be avoided Outcome: Progressing   Problem: Activity: Goal: Risk for activity intolerance will decrease Outcome: Progressing   Problem: Nutrition: Goal: Adequate nutrition will be maintained Outcome: Progressing   Problem: Coping: Goal: Level of anxiety will decrease Outcome: Progressing   Problem: Elimination: Goal: Will not experience complications related to bowel motility Outcome: Progressing Goal: Will not experience complications related to urinary retention Outcome: Progressing   Problem: Pain Managment: Goal: General experience of comfort will improve Outcome: Progressing   Problem: Safety: Goal: Ability to remain free from injury will improve Outcome: Progressing   Problem: Skin Integrity: Goal: Risk for impaired skin integrity will decrease Outcome: Progressing   Problem: Education: Goal: Knowledge of [facility] Education information/materials will improve Outcome: Progressing Goal: Emotional status will improve Outcome: Progressing Goal: Mental status will improve Outcome: Progressing Goal: Verbalization of understanding the information provided will improve Outcome: Progressing   Problem: Activity: Goal: Interest or engagement in activities will  improve Outcome: Progressing Goal: Sleeping patterns will improve Outcome: Progressing   Problem: Coping: Goal: Ability to verbalize frustrations and anger appropriately will improve Outcome: Progressing Goal: Ability to demonstrate self-control will improve Outcome: Progressing   Problem: Health Behavior/Discharge Planning: Goal: Identification of resources available to assist in meeting health care needs will improve Outcome: Progressing Goal: Compliance with treatment plan for underlying cause of condition will improve Outcome: Progressing   Problem: Physical Regulation: Goal: Ability to maintain clinical measurements within normal limits will improve Outcome: Progressing   Problem: Safety: Goal: Periods of time without injury will increase Outcome: Progressing   Problem: Education: Goal: Utilization of techniques to improve thought processes will improve Outcome: Progressing Goal: Knowledge of the prescribed therapeutic regimen will improve Outcome: Progressing   Problem: Activity: Goal: Interest or engagement in leisure activities will improve Outcome: Progressing Goal: Imbalance in normal sleep/wake cycle will improve Outcome: Progressing   Problem: Health Behavior/Discharge Planning: Goal: Ability to make decisions will improve Outcome: Progressing Goal: Compliance with therapeutic regimen will improve Outcome: Progressing   Problem: Safety: Goal: Ability to disclose and discuss suicidal ideas will improve Outcome: Progressing Goal: Ability to identify and utilize support systems that promote safety will improve Outcome: Progressing   Problem: Self-Concept: Goal: Will verbalize positive feelings about self Outcome: Progressing Goal: Level of anxiety will decrease Outcome: Progressing

## 2022-04-22 ENCOUNTER — Other Ambulatory Visit: Payer: Self-pay | Admitting: Psychiatry

## 2022-04-22 ENCOUNTER — Encounter: Payer: Self-pay | Admitting: Psychiatry

## 2022-04-22 ENCOUNTER — Inpatient Hospital Stay: Payer: Medicare Other | Admitting: Anesthesiology

## 2022-04-22 ENCOUNTER — Ambulatory Visit: Payer: Medicare Other | Attending: Psychiatry

## 2022-04-22 LAB — GLUCOSE, CAPILLARY
Glucose-Capillary: 90 mg/dL (ref 70–99)
Glucose-Capillary: 162 mg/dL — ABNORMAL HIGH (ref 70–99)
Glucose-Capillary: 141 mg/dL — ABNORMAL HIGH (ref 70–99)

## 2022-04-22 MED ORDER — MIDAZOLAM HCL 2 MG/2ML IJ SOLN
INTRAMUSCULAR | Status: AC
  Filled 2022-04-22: qty 2

## 2022-04-22 MED ORDER — SODIUM CHLORIDE 0.9 % IV SOLN: INTRAVENOUS | Status: DC | PRN

## 2022-04-22 MED ORDER — MIDAZOLAM HCL 2 MG/2ML IJ SOLN
INTRAMUSCULAR | Status: DC | PRN
  Administered 2022-04-22: 2 mg via INTRAVENOUS

## 2022-04-22 MED ORDER — GLYCOPYRROLATE 0.2 MG/ML IJ SOLN
INTRAMUSCULAR | Status: AC
  Filled 2022-04-22: qty 1

## 2022-04-22 MED ORDER — MIDAZOLAM HCL 2 MG/2ML IJ SOLN: 2.0000 mg | Freq: Once | INTRAMUSCULAR | Status: DC

## 2022-04-22 MED ORDER — GLYCOPYRROLATE 0.2 MG/ML IJ SOLN
0.1000 mg | Freq: Once | INTRAMUSCULAR | Status: AC
  Administered 2022-04-22: 0.2 mg via INTRAVENOUS
  Filled 2022-04-22: qty 0.5

## 2022-04-22 MED ORDER — KETOROLAC TROMETHAMINE 30 MG/ML IJ SOLN
30.0000 mg | Freq: Once | INTRAMUSCULAR | Status: AC
  Administered 2022-04-22: 30 mg via INTRAVENOUS

## 2022-04-22 MED ORDER — SUCCINYLCHOLINE CHLORIDE 200 MG/10ML IV SOSY
PREFILLED_SYRINGE | INTRAVENOUS | Status: DC | PRN
  Administered 2022-04-22: 100 mg via INTRAVENOUS

## 2022-04-22 MED ORDER — KETOROLAC TROMETHAMINE 30 MG/ML IJ SOLN
INTRAMUSCULAR | Status: AC
  Filled 2022-04-22: qty 1

## 2022-04-22 MED ORDER — METHOHEXITAL SODIUM 100 MG/10ML IV SOSY
PREFILLED_SYRINGE | INTRAVENOUS | Status: DC | PRN
  Administered 2022-04-22: 90 mg via INTRAVENOUS

## 2022-04-22 MED ORDER — SODIUM CHLORIDE 0.9 % IV SOLN
500.0000 mL | Freq: Once | INTRAVENOUS | Status: AC
  Administered 2022-04-22: 500 mL via INTRAVENOUS

## 2022-04-22 NOTE — BH IP Treatment Plan (Signed)
Interdisciplinary Treatment and Diagnostic Plan Update  04/22/2022 Time of Session: 9:00AM John [name] MRN: [id number]  Principal Diagnosis: Severe recurrent major depression without psychotic features [facility])  Secondary Diagnoses: Principal Problem:   Severe recurrent major depression without psychotic features [facility]) Active Problems:   Paroxysmal atrial fibrillation (HCC)   Intertrochanteric fracture of right hip (HCC)   Current Medications:  Current Facility-Administered Medications  Medication Dose Route Frequency Provider Last Rate Last Admin   acetaminophen (TYLENOL) tablet 325-650 mg  325-650 mg Oral Q6H PRN Clapacs, Jackquline Denmark, MD   650 mg at 04/21/22 0844   alum & mag hydroxide-simeth (MAALOX/MYLANTA) 200-200-20 MG/5ML suspension 30 mL  30 mL Oral Q4H PRN Clapacs, Jackquline Denmark, MD       ARIPiprazole (ABILIFY) tablet 2 mg  2 mg Oral QPC breakfast Sarina Ill, DO   2 mg at 04/21/22 9563   aspirin EC tablet 81 mg  81 mg Oral Daily Clapacs, Jackquline Denmark, MD   81 mg at 04/21/22 0845   bisacodyl (DULCOLAX) suppository 10 mg  10 mg Rectal Daily PRN Clapacs, Jackquline Denmark, MD       buPROPion (WELLBUTRIN XL) 24 hr tablet 300 mg  300 mg Oral Daily Clapacs, John T, MD   300 mg at 04/21/22 0846   docusate sodium (COLACE) capsule 100 mg  100 mg Oral BID Clapacs, John T, MD   100 mg at 04/21/22 2132   fentaNYL (SUBLIMAZE) injection 25 mcg  25 mcg Intravenous Q5 min PRN Darleene Cleaver, Gerrit Heck, MD       ferrous sulfate tablet 325 mg  325 mg Oral Q breakfast Clapacs, Jackquline Denmark, MD   325 mg at 04/21/22 0845   HYDROcodone-acetaminophen (NORCO/VICODIN) 5-325 MG per tablet 1-2 tablet  1-2 tablet Oral Q4H PRN Clapacs, Jackquline Denmark, MD   2 tablet at 04/18/22 0200   insulin glargine-yfgn (SEMGLEE) injection 5 Units  5 Units Subcutaneous BID Clapacs, Jackquline Denmark, MD   5 Units at 04/21/22 2134   menthol-cetylpyridinium (CEPACOL) lozenge 3 mg  1 lozenge Oral PRN Clapacs, Jackquline Denmark, MD       Or   phenol (CHLORASEPTIC) mouth spray 1  spray  1 spray Mouth/Throat PRN Clapacs, Jackquline Denmark, MD       methocarbamol (ROBAXIN) tablet 500 mg  500 mg Oral Q6H PRN Clapacs, Jackquline Denmark, MD       Or   methocarbamol (ROBAXIN) 500 mg in dextrose 5 % 50 mL IVPB  500 mg Intravenous Q6H PRN Clapacs, John T, MD       metoprolol tartrate (LOPRESSOR) tablet 25 mg  25 mg Oral BID Clapacs, John T, MD   25 mg at 04/21/22 2132   ondansetron (ZOFRAN) injection 4 mg  4 mg Intravenous Once PRN Darleene Cleaver, Gijsbertus F, MD       polyethylene glycol (MIRALAX / GLYCOLAX) packet 17 g  17 g Oral Daily Clapacs, Jackquline Denmark, MD   17 g at 04/21/22 0842   prazosin (MINIPRESS) capsule 1 mg  1 mg Oral QHS Clapacs, John T, MD   1 mg at 04/21/22 2132   senna (SENOKOT) tablet 8.6 mg  1 tablet Oral BID Clapacs, John T, MD   8.6 mg at 04/21/22 2132   sodium phosphate (FLEET) 7-19 GM/118ML enema 1 enema  1 enema Rectal Once PRN Clapacs, Jackquline Denmark, MD       traZODone (DESYREL) tablet 50 mg  50 mg Oral QHS PRN Clapacs, Jackquline Denmark, MD   50  mg at 04/21/22 2132   zolpidem (AMBIEN) tablet 5 mg  5 mg Oral QHS PRN Clapacs, Jackquline Denmark, MD       PTA Medications: Medications Prior to Admission  Medication Sig Dispense Refill Last Dose   alum & mag hydroxide-simeth (MAALOX/MYLANTA) 200-200-20 MG/5ML suspension Take 30 mLs by mouth every 4 (four) hours as needed for indigestion. 355 mL 0 04/18/2022   aspirin EC 81 MG tablet Take 81 mg by mouth daily.   04/18/2022   buPROPion (WELLBUTRIN XL) 150 MG 24 hr tablet Take 1 tablet (150 mg total) by mouth daily. 30 tablet 0 04/18/2022   docusate sodium (COLACE) 100 MG capsule Take 1 capsule (100 mg total) by mouth 2 (two) times daily. 10 capsule 0 04/18/2022   ferrous sulfate 325 (65 FE) MG tablet Take 1 tablet (325 mg total) by mouth daily with breakfast.  3 04/18/2022   HYDROcodone-acetaminophen (NORCO/VICODIN) 5-325 MG tablet Take 1-2 tablets by mouth every 4 (four) hours as needed for moderate pain (pain score 4-6). 30 tablet 0 04/18/2022   insulin aspart  (NOVOLOG) 100 UNIT/ML injection Inject 0-9 Units into the skin 3 (three) times daily with meals. 10 mL 11 04/18/2022   insulin glargine-yfgn (SEMGLEE) 100 UNIT/ML injection Inject 0.08 mLs (8 Units total) into the skin 2 (two) times daily. 10 mL 11 04/18/2022   LORazepam (ATIVAN) 1 MG tablet Take 1 tablet (1 mg total) by mouth every 4 (four) hours as needed for anxiety, sedation or sleep. 30 tablet 0 04/18/2022   methocarbamol (ROBAXIN) 500 MG tablet Take 1 tablet (500 mg total) by mouth every 6 (six) hours as needed for muscle spasms.   04/18/2022   metoprolol tartrate (LOPRESSOR) 25 MG tablet Take 1 tablet (25 mg total) by mouth 2 (two) times daily. 60 tablet 0 04/18/2022   polyethylene glycol (MIRALAX / GLYCOLAX) 17 g packet Take 17 g by mouth daily as needed. 14 each 0 04/18/2022   traZODone (DESYREL) 100 MG tablet Take 1 tablet (100 mg total) by mouth at bedtime as needed for sleep. 30 tablet 0 04/18/2022    Patient Stressors: Health problems   Loss of full ambulatory function prior to surgery   Medication change or noncompliance    Patient Strengths: Printmaker for treatment/growth  Supportive family/friends   Treatment Modalities: Medication Management, Group therapy, Case management,  1 to 1 session with clinician, Psychoeducation, Recreational therapy.   Physician Treatment Plan for Primary Diagnosis: Severe recurrent major depression without psychotic features (HCC) Long Term Goal(s): Improvement in symptoms so as ready for discharge   Short Term Goals: Ability to identify and develop effective coping behaviors will improve Ability to maintain clinical measurements within normal limits will improve Ability to verbalize feelings will improve Ability to disclose and discuss suicidal ideas Ability to demonstrate self-control will improve  Medication Management: Evaluate patient's response, side effects, and tolerance of medication regimen.  Therapeutic  Interventions: 1 to 1 sessions, Unit Group sessions and Medication administration.  Evaluation of Outcomes: Progressing  Physician Treatment Plan for Secondary Diagnosis: Principal Problem:   Severe recurrent major depression without psychotic features (HCC) Active Problems:   Paroxysmal atrial fibrillation (HCC)   Intertrochanteric fracture of right hip (HCC)  Long Term Goal(s): Improvement in symptoms so as ready for discharge   Short Term Goals: Ability to identify and develop effective coping behaviors will improve Ability to maintain clinical measurements within normal limits will improve Ability to verbalize feelings will improve Ability to disclose  and discuss suicidal ideas Ability to demonstrate self-control will improve     Medication Management: Evaluate patient's response, side effects, and tolerance of medication regimen.  Therapeutic Interventions: 1 to 1 sessions, Unit Group sessions and Medication administration.  Evaluation of Outcomes: Progressing   RN Treatment Plan for Primary Diagnosis: Severe recurrent major depression without psychotic features (HCC) Long Term Goal(s): Knowledge of disease and therapeutic regimen to maintain health will improve  Short Term Goals: Ability to remain free from injury will improve, Ability to demonstrate self-control, Ability to participate in decision making will improve, Ability to verbalize feelings will improve, Ability to identify and develop effective coping behaviors will improve, and Compliance with prescribed medications will improve  Medication Management: RN will administer medications as ordered by provider, will assess and evaluate patient's response and provide education to patient for prescribed medication. RN will report any adverse and/or side effects to prescribing provider.  Therapeutic Interventions: 1 on 1 counseling sessions, Psychoeducation, Medication administration, Evaluate responses to treatment, Monitor  vital signs and CBGs as ordered, Perform/monitor CIWA, COWS, AIMS and Fall Risk screenings as ordered, Perform wound care treatments as ordered.  Evaluation of Outcomes: Progressing   LCSW Treatment Plan for Primary Diagnosis: Severe recurrent major depression without psychotic features (HCC) Long Term Goal(s): Safe transition to appropriate next level of care at discharge, Engage patient in therapeutic group addressing interpersonal concerns.  Short Term Goals: Engage patient in aftercare planning with referrals and resources, Increase social support, Increase ability to appropriately verbalize feelings, Increase emotional regulation, Facilitate acceptance of mental health diagnosis and concerns, Identify triggers associated with mental health/substance abuse issues, and Increase skills for wellness and recovery  Therapeutic Interventions: Assess for all discharge needs, 1 to 1 time with Social worker, Explore available resources and support systems, Assess for adequacy in community support network, Educate family and significant other(s) on suicide prevention, Complete Psychosocial Assessment, Interpersonal group therapy.  Evaluation of Outcomes: Progressing   Progress in Treatment: Attending groups: Yes. Participating in groups: Yes. Taking medication as prescribed: Yes. Toleration medication: Yes. Family/Significant other contact made: Yes, individual(s) contacted:  Contact attempts made with pt's wife, John [name]. SPE completed with pt Patient understands diagnosis: Yes. Discussing patient identified problems/goals with staff: Yes. Medical problems stabilized or resolved: Yes. Denies suicidal/homicidal ideation: Yes. Issues/concerns per patient self-inventory: No. Other: None  New problem(s) identified: No, Describe:  None  New Short Term/Long Term Goal(s): Patient to work towards medication management for mood stabilization;  development of comprehensive mental wellness plan.  Update 04/17/22: No changes at this time. Update 04/22/22: No changes at this time.      Patient Goals:  "work on my self-esteem" Update 04/17/22: No changes at this time. Update 04/22/22: No changes at this time.        Discharge Plan or Barriers: CSW will assist pt with development of appropriate discharge/aftercare plan. Update 04/17/22: No changes at this time. Update 04/22/22: No changes at this time.      Reason for Continuation of Hospitalization: Anxiety Depression Medication stabilization   Estimated Length of Stay: TBD   Last 3 [place] Suicide Severity Risk Score: Flowsheet Row Admission (Current) from 04/11/2022 in [facility] BEHAVIORAL MEDICINE Admission (Discharged) from 04/06/2022 in [facility] (1A) Admission (Discharged) from 04/04/2022 in [facility]AL MEDIC[facility]  C-SSRS RISK CATEGORY No Risk No Risk No Risk       Last PHQ 2/9 Scores:    12/21/2021    8:59 AM 04/22/2017  10:46 AM  Depression screen PHQ 2/9  Decreased Interest 0 0  Down, Depressed, Hopeless 0 0  PHQ - 2 Score 0 0    Scribe for Treatment Team: Kegan Shepardson A [place], Theresia Majors 04/22/2022 9:35 AM

## 2022-04-22 NOTE — Anesthesia Procedure Notes (Signed)
Date/Time: 04/22/2022 12:40 PM  Performed by: Irving Burton, CRNAPre-anesthesia Checklist: Patient identified, Emergency Drugs available, Suction available and Patient being monitored Patient Re-evaluated:Patient Re-evaluated prior to induction Oxygen Delivery Method: Circle system utilized Preoxygenation: Pre-oxygenation with 100% oxygen Induction Type: IV induction Ventilation: Mask ventilation without difficulty and Mask ventilation throughout procedure Airway Equipment and Method: Bite block Placement Confirmation: positive ETCO2 Dental Injury: Teeth and Oropharynx as per pre-operative assessment

## 2022-04-23 ENCOUNTER — Other Ambulatory Visit: Payer: Self-pay | Admitting: Psychiatry

## 2022-04-23 DIAGNOSIS — F332 Major depressive disorder, recurrent severe without psychotic features: Secondary | ICD-10-CM | POA: Diagnosis not present

## 2022-04-23 LAB — GLUCOSE, CAPILLARY
Glucose-Capillary: 172 mg/dL — ABNORMAL HIGH (ref 70–99)
Glucose-Capillary: 159 mg/dL — ABNORMAL HIGH (ref 70–99)

## 2022-04-23 MED ORDER — PROPOFOL 1000 MG/100ML IV EMUL
INTRAVENOUS | Status: AC
  Filled 2022-04-23: qty 100

## 2022-04-23 MED ORDER — PHENYLEPHRINE HCL (PRESSORS) 10 MG/ML IV SOLN
INTRAVENOUS | Status: AC
  Filled 2022-04-23: qty 1

## 2022-04-23 MED ORDER — ARIPIPRAZOLE 2 MG PO TABS
4.0000 mg | ORAL_TABLET | Freq: Every day | ORAL | Status: DC
  Administered 2022-04-25: 4 mg via ORAL
  Filled 2022-04-23: qty 2

## 2022-04-23 MED ORDER — TAMSULOSIN HCL 0.4 MG PO CAPS
0.4000 mg | ORAL_CAPSULE | Freq: Every day | ORAL | Status: DC
  Administered 2022-04-23 – 2022-04-25 (×3): 0.4 mg via ORAL
  Filled 2022-04-23 (×3): qty 1

## 2022-04-23 NOTE — Progress Notes (Signed)
[facility] MD Progress Note  04/23/2022 10:47 AM John [name]  MRN:  [id number] Subjective: John [name] continues to struggle with depression.  His affect is actually a little bit better.  Dr. Toni Amend agrees with me.  He continues with ECT without any problems.  I looked at his wound from his arthroplasty and it looks pretty good but there is a spot at the bottom that nurses want ortho to look at.  Does not look infected to me.  He is still very avolitional, but denies any suicidal ideation.  He is tolerating the medications including Abilify without any problems so I would slowly go up on it.  Principal Problem: Severe recurrent major depression without psychotic features (HCC) Diagnosis: Principal Problem:   Severe recurrent major depression without psychotic features (HCC) Active Problems:   Paroxysmal atrial fibrillation (HCC)   Intertrochanteric fracture of right hip (HCC)  Total Time spent with patient: 15 minutes  Past Psychiatric History: Chronic and recurrent major depression  Past Medical History:  Past Medical History:  Diagnosis Date   Cancer (HCC)    Depression    Depression    Diabetes mellitus without complication (HCC)    type 2   Dissection of carotid artery (HCC)    a. 06/2015 CT Head: abnl thickening of mid-dist R cervical ICA w/o narrowing - ? vasculitis and thrombosed/healing dissection; b. 01/2021 Carotid U/S: 1-39% bilat ICA stenoses.   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 Age 81 - struck in head w/ baseball --> s/p craniotomy.   Epidural hematoma (HCC)    H/O Osgood-Schlatter disease    Hand deformity, congenital    Hearing loss bilateral   Hemorrhoids    Horner syndrome    Hypertension    Migraine    Obesity    PONV (postoperative nausea and vomiting)    Prostate cancer (HCC) 2020   Rosacea    SDH (subdural hematoma) (HCC)    Sleep apnea    Spontaneous Subdural hematoma (HCC)    a. Approx 2015 - eval in [place] - conservatively managed.    Past  Surgical History:  Procedure Laterality Date   ANKLE ARTHROSCOPY Left    fracture   CHOLECYSTECTOMY  2007   CRANIOTOMY  1958   left frontal craniotomy for epidural hematoma   HIP ARTHROPLASTY Right 04/07/2022   Procedure: ARTHROPLASTY BIPOLAR HIP (HEMIARTHROPLASTY);  Surgeon: Deeann Saint, MD;  Location: [facility] ORS;  Service: Orthopedics;  Laterality: Right;   INSERTION OF MESH  01/02/2022   Procedure: INSERTION OF MESH;  Surgeon: Carolan Shiver, MD;  Location: [facility] ORS;  Service: General;;   LIGAMENT REPAIR Right    thumb   RETINAL DETACHMENT SURGERY     TREATMENT FISTULA ANAL  2006   Family History:  Family History  Problem Relation Age of Onset   Osteoporosis Mother    Depression Mother    Prostate cancer Father    Hypertension Father    Diabetes Brother    Kidney disease Neg Hx     Social History:  Social History   Substance and Sexual Activity  Alcohol Use Not Currently     Social History   Substance and Sexual Activity  Drug Use No    Social History   Socioeconomic History   Marital status: Married    Spouse name: Okey Regal   Number of children: 2   Years of education: Not on file   Highest education level: Not on file  Occupational History   Not on file  Tobacco Use   Smoking status: Never   Smokeless tobacco: Never  Vaping Use   Vaping Use: Never used  Substance and Sexual Activity   Alcohol use: Not Currently   Drug use: No   Sexual activity: Yes    Birth control/protection: None  Other Topics Concern   Not on file  Social History Narrative   Live at home with wife.    Social Determinants of Health   Financial Resource Strain: Not on file  Food Insecurity: Not on file  Transportation Needs: Not on file  Physical Activity: Not on file  Stress: Not on file  Social Connections: Not on file   Additional Social History:                         Sleep: Good  Appetite:  Good  Current Medications: Current Facility-Administered  Medications  Medication Dose Route Frequency Provider Last Rate Last Admin   acetaminophen (TYLENOL) tablet 325-650 mg  325-650 mg Oral Q6H PRN Clapacs, Jackquline Denmark, MD   650 mg at 04/21/22 0844   alum & mag hydroxide-simeth (MAALOX/MYLANTA) 200-200-20 MG/5ML suspension 30 mL  30 mL Oral Q4H PRN Clapacs, Jackquline Denmark, MD       [START ON 04/24/2022] ARIPiprazole (ABILIFY) tablet 4 mg  4 mg Oral QPC breakfast Sarina Ill, DO       aspirin EC tablet 81 mg  81 mg Oral Daily Clapacs, Jackquline Denmark, MD   81 mg at 04/23/22 7829   bisacodyl (DULCOLAX) suppository 10 mg  10 mg Rectal Daily PRN Clapacs, Jackquline Denmark, MD       buPROPion (WELLBUTRIN XL) 24 hr tablet 300 mg  300 mg Oral Daily Clapacs, John T, MD   300 mg at 04/23/22 0926   docusate sodium (COLACE) capsule 100 mg  100 mg Oral BID Clapacs, John T, MD   100 mg at 04/23/22 0926   fentaNYL (SUBLIMAZE) injection 25 mcg  25 mcg Intravenous Q5 min PRN Darleene Cleaver, Gijsbertus F, MD       ferrous sulfate tablet 325 mg  325 mg Oral Q breakfast Clapacs, Jackquline Denmark, MD   325 mg at 04/23/22 5621   HYDROcodone-acetaminophen (NORCO/VICODIN) 5-325 MG per tablet 1-2 tablet  1-2 tablet Oral Q4H PRN Clapacs, Jackquline Denmark, MD   2 tablet at 04/18/22 0200   insulin glargine-yfgn (SEMGLEE) injection 5 Units  5 Units Subcutaneous BID Clapacs, Jackquline Denmark, MD   5 Units at 04/23/22 3086   menthol-cetylpyridinium (CEPACOL) lozenge 3 mg  1 lozenge Oral PRN Clapacs, Jackquline Denmark, MD       Or   phenol (CHLORASEPTIC) mouth spray 1 spray  1 spray Mouth/Throat PRN Clapacs, Jackquline Denmark, MD       methocarbamol (ROBAXIN) tablet 500 mg  500 mg Oral Q6H PRN Clapacs, Jackquline Denmark, MD       Or   methocarbamol (ROBAXIN) 500 mg in dextrose 5 % 50 mL IVPB  500 mg Intravenous Q6H PRN Clapacs, John T, MD       metoprolol tartrate (LOPRESSOR) tablet 25 mg  25 mg Oral BID Clapacs, Jackquline Denmark, MD   25 mg at 04/23/22 0926   midazolam (VERSED) injection 2 mg  2 mg Intravenous Once Clapacs, John T, MD       ondansetron (ZOFRAN) injection 4  mg  4 mg Intravenous Once PRN Darleene Cleaver, Gerrit Heck, MD  polyethylene glycol (MIRALAX / GLYCOLAX) packet 17 g  17 g Oral Daily Clapacs, Jackquline Denmark, MD   17 g at 04/23/22 0981   prazosin (MINIPRESS) capsule 1 mg  1 mg Oral QHS Clapacs, John T, MD   1 mg at 04/22/22 2211   senna (SENOKOT) tablet 8.6 mg  1 tablet Oral BID Clapacs, John T, MD   8.6 mg at 04/23/22 1914   sodium phosphate (FLEET) 7-19 GM/118ML enema 1 enema  1 enema Rectal Once PRN Clapacs, Jackquline Denmark, MD       traZODone (DESYREL) tablet 50 mg  50 mg Oral QHS PRN Clapacs, Jackquline Denmark, MD   50 mg at 04/21/22 2132   zolpidem (AMBIEN) tablet 5 mg  5 mg Oral QHS PRN Clapacs, Jackquline Denmark, MD        Lab Results:  Results for orders placed or performed during the hospital encounter of 04/11/22 (from the past 48 hour(s))  Glucose, capillary     Status: Abnormal   Collection Time: 04/21/22  8:14 PM  Result Value Ref Range   Glucose-Capillary 167 (H) 70 - 99 mg/dL    Comment: Glucose reference range applies only to samples taken after fasting for at least 8 hours.  Glucose, capillary     Status: None   Collection Time: 04/22/22  7:59 AM  Result Value Ref Range   Glucose-Capillary 90 70 - 99 mg/dL    Comment: Glucose reference range applies only to samples taken after fasting for at least 8 hours.   Comment 1 Notify RN   Glucose, capillary     Status: Abnormal   Collection Time: 04/22/22  4:00 PM  Result Value Ref Range   Glucose-Capillary 162 (H) 70 - 99 mg/dL    Comment: Glucose reference range applies only to samples taken after fasting for at least 8 hours.   Comment 1 Notify RN   Glucose, capillary     Status: Abnormal   Collection Time: 04/22/22 10:08 PM  Result Value Ref Range   Glucose-Capillary 141 (H) 70 - 99 mg/dL    Comment: Glucose reference range applies only to samples taken after fasting for at least 8 hours.  Glucose, capillary     Status: Abnormal   Collection Time: 04/23/22  9:21 AM  Result Value Ref Range    Glucose-Capillary 172 (H) 70 - 99 mg/dL    Comment: Glucose reference range applies only to samples taken after fasting for at least 8 hours.   Comment 1 Notify RN     Blood Alcohol level:  Lab Results  Component Value Date   ETH <10 03/12/2022    Metabolic Disorder Labs: Lab Results  Component Value Date   HGBA1C 5.6 03/19/2022   MPG 114.02 03/19/2022   No results found for: "PROLACTIN" Lab Results  Component Value Date   CHOL 131 03/19/2022   TRIG 92 03/19/2022   HDL 33 (L) 03/19/2022   CHOLHDL 4.0 03/19/2022   VLDL 18 03/19/2022   LDLCALC 80 03/19/2022    Physical Findings: AIMS: Facial and Oral Movements Muscles of Facial Expression: None, normal Lips and Perioral Area: None, normal Jaw: None, normal Tongue: None, normal,Extremity Movements Upper (arms, wrists, hands, fingers): None, normal Lower (legs, knees, ankles, toes): Moderate (Rt. leg & hip pain. S/P Rt. hip surgery), Trunk Movements Neck, shoulders, hips: None, normal, Overall Severity Severity of abnormal movements (highest score from questions above): Moderate (R/T to Rt. hip surgery) Incapacitation due to abnormal movements: None, normal Patient's awareness  of abnormal movements (rate only patient's report): No Awareness, Dental Status Current problems with teeth and/or dentures?: No Does patient usually wear dentures?: No  CIWA:    COWS:     Musculoskeletal: Strength & Muscle Tone: within normal limits Gait & Station: unsteady Patient leans: N/A  Psychiatric Specialty Exam:  Presentation  General Appearance: Appropriate for Environment  Eye Contact:Good  Speech:Clear and Coherent  Speech Volume:Normal  Handedness:Right   Mood and Affect  Mood:Anxious (stated "a little anxious")  Affect:Congruent   Thought Process  Thought Processes:Linear; Coherent; Goal Directed  Descriptions of Associations:Intact  Orientation:Full (Time, Place and Person)  Thought Content:Logical;  WDL  History of Schizophrenia/Schizoaffective disorder:No  Duration of Psychotic Symptoms:No data recorded Hallucinations:No data recorded Ideas of Reference:None  Suicidal Thoughts:No data recorded Homicidal Thoughts:No data recorded  Sensorium  Memory:Immediate Good  Judgment:Good  Insight:Good   Executive Functions  Concentration:Good  Attention Span:Good  Recall:Good  Fund of Knowledge:Good  Language:Good   Psychomotor Activity  Psychomotor Activity:No data recorded  Assets  Assets:Housing; Health and safety inspector; Desire for Improvement; Communication Skills; Resilience; Social Support   Sleep  Sleep:No data recorded   Physical Exam: Physical Exam Vitals and nursing note reviewed.  Constitutional:      Appearance: Normal appearance. He is normal weight.  Neurological:     General: No focal deficit present.     Mental Status: He is alert and oriented to person, place, and time.  Psychiatric:        Attention and Perception: Attention and perception normal.        Mood and Affect: Mood is depressed. Affect is flat.        Speech: Speech normal.        Behavior: Behavior normal. Behavior is cooperative.        Thought Content: Thought content normal.        Cognition and Memory: Cognition and memory normal.        Judgment: Judgment normal.    Review of Systems  Constitutional: Negative.   HENT: Negative.    Eyes: Negative.   Respiratory: Negative.    Cardiovascular: Negative.   Gastrointestinal: Negative.   Genitourinary: Negative.   Musculoskeletal: Negative.   Skin: Negative.   Neurological: Negative.   Endo/Heme/Allergies: Negative.   Psychiatric/Behavioral:  Positive for depression. The patient is nervous/anxious.    Blood pressure 132/89, pulse 61, temperature 98 F (36.7 C), resp. rate 18, height 6\' 2"  (1.88 m), weight 87.8 kg, SpO2 100 %. Body mass index is 24.84 kg/m.   Treatment Plan Summary: Daily contact with patient  to assess and evaluate symptoms and progress in treatment, Medication management, and Plan continue PT and ECT.  Increase Abilify to 4 mg every morning starting tomorrow.  Sarina Ill, DO 04/23/2022, 10:47 AM

## 2022-04-23 NOTE — Progress Notes (Signed)
Pt is seen in the dayroom at the beginning of the shift. He denies SI, HI, AVH, anxiety, and depression. Pt is slow to respond to prompts but is AAOx4. He is calm and cooperative. Medication complaint. Pt in no current distress. Tele sitter in place. Q 15 min safety checks in place.

## 2022-04-23 NOTE — Progress Notes (Signed)
Pt standing at nurses' station; calm, cooperative. Pt states "I'm having a bit of..what do you call it when you can't control your urine?" Pt confirmed that he had an incontinent episode. He currently denies pain and SI/HI/AVH. He reports that he is sleeping "moderately well" and describes his appetite as "pretty good". He states that he has "moderate" anxiety and says that it is "just general anxiety" when asked the reason for his anxiety. He expresses "not much" depression and says that it is "just general depression" when asked what he thinks is the reason for his depression. No acute distress noted.

## 2022-04-23 NOTE — Anesthesia Postprocedure Evaluation (Signed)
Anesthesia Post Note  Patient: John [name]  Procedure(s) Performed: ECT TX  Patient location during evaluation: PACU Anesthesia Type: General Level of consciousness: awake and alert Pain management: pain level controlled Vital Signs Assessment: post-procedure vital signs reviewed and stable Respiratory status: spontaneous breathing, nonlabored ventilation, respiratory function stable and patient connected to nasal cannula oxygen Cardiovascular status: blood pressure returned to baseline and stable Postop Assessment: no apparent nausea or vomiting Anesthetic complications: no   No notable events documented.   Last Vitals:  Vitals:   04/22/22 2024 04/23/22 0754  BP: 133/88 132/89  Pulse: 77 61  Resp:  18  Temp: 36.9 C 36.7 C  SpO2: 98% 100%    Last Pain:  Vitals:   04/22/22 2100  TempSrc:   PainSc: 0-No pain                 Lenard Simmer

## 2022-04-23 NOTE — Progress Notes (Signed)
Occupational Therapy Treatment Patient Details Name: John [name] MRN: [id number] DOB: [birth year] Today's Date: 04/23/2022   History of present illness Pt is a 81 yo male with PMH of depression, anxiety, HTN, DM, OSA, prostate cancer, Horner syndrome, dissection of carotid artery, subdural hematoma, epidural hematoma. Previously hospitalized from 6/5-6/8 for severe depression and found to have abnormal EKG with A-fib. Pt receiving ECT for management of depression. Pt admitted 6/10 due to R femoral neck fx due to a fall in the  bathroom on the evening of 6/9. Pt underwent R hip hemiarthroplasty on 6/11. Pt discharged from inpatient unit and now admitted to [facility] psych unit.   OT comments  John [name] continues to make improvements. He displays improved standing balance and is able to engage in transfers, bed mobility, toileting, grooming, all with Mod I-SUPV. He states his mood is "a bit" better, he now expresses anxiety about his upcoming discharge, worrying about how he will do getting back into a routine and life outside the hospital. He says, "I'd like to get things more settled." Discussed how he could make a schedule for himself, with pt able to provide a number of suggestions. Verbalized that he would like to have appt with recommended therapist already on the calendar by the time he leaves [facility]. Will continue to follow up with these ideas and work on improving fxl mobility in the week ahead.   Recommendations for follow up therapy are one component of a multi-disciplinary discharge planning process, led by the attending physician.  Recommendations may be updated based on patient status, additional functional criteria and insurance authorization.    Follow Up Recommendations  No OT follow up    Assistance Recommended at Discharge Intermittent Supervision/Assistance  Patient can return home with the following  A little help with walking and/or transfers;A little help with  bathing/dressing/bathroom   Equipment Recommendations       Recommendations for Other Services Other (comment) (outpt mental health services)    Precautions / Restrictions Precautions Precautions: Posterior Hip;Fall Restrictions Weight Bearing Restrictions: Yes RLE Weight Bearing: Weight bearing as tolerated       Mobility Bed Mobility Overal bed mobility: Modified Independent Bed Mobility: Sit to Supine       Sit to supine: Modified independent (Device/Increase time)        Transfers Overall transfer level: Needs assistance Equipment used: Rolling walker (2 wheels) Transfers: Sit to/from Stand, Bed to chair/wheelchair/BSC Sit to Stand: Supervision     Step pivot transfers: Supervision     General transfer comment: comes into standing with RLE forward, LLE back.     Balance Overall balance assessment: Needs assistance Sitting-balance support: Feet supported Sitting balance-Leahy Scale: Good     Standing balance support: Single extremity supported, During functional activity, Bilateral upper extremity supported, No upper extremity supported Standing balance-Leahy Scale: Good Standing balance comment: Able to perform grooming standing at sink with no UE support, no LOB                           ADL either performed or assessed with clinical judgement   ADL Overall ADL's : Needs assistance/impaired     Grooming: Wash/dry hands;Wash/dry face;Standing;Modified independent                   Statistician: Regular Toilet;Rolling walker (2 wheels);Supervision/safety Toilet Transfer Details (indicate cue type and reason): ambulates to bathroom w/ RW, maneuvers to toilet w/o using RW, no physical assistance  required Toileting- Clothing Manipulation and Hygiene: Modified independent       Functional mobility during ADLs: Supervision/safety      Extremity/Trunk Assessment     Lower Extremity Assessment Lower Extremity Assessment: RLE  deficits/detail RLE Deficits / Details: s/p mechanical fall and R hip fx 2 weeks ago RLE Sensation: WNL   Cervical / Trunk Assessment Cervical / Trunk Assessment: Normal    Vision       Perception     Praxis      Cognition Arousal/Alertness: Awake/alert Behavior During Therapy: Flat affect Overall Cognitive Status: No family/caregiver present to determine baseline cognitive functioning                                 General Comments: somewhat flat today; expresses anxiety re: discharging, getting back to his usual life        Exercises Other Exercises Other Exercises: Provided educ re: preparing for discharge,  importance of engagement in meaningful occupations, setting up outpt therapy    Shoulder Instructions       General Comments      Pertinent Vitals/ Pain       Pain Assessment Pain Assessment: 0-10  Home Living                                          Prior Functioning/Environment              Frequency  Min 2X/week        Progress Toward Goals  OT Goals(current goals can now be found in the care plan section)  Progress towards OT goals: Progressing toward goals  Acute Rehab OT Goals OT Goal Formulation: With patient Time For Goal Achievement: 04/28/22 Potential to Achieve Goals: Good  Plan Discharge plan remains appropriate;Frequency remains appropriate    Co-evaluation                 AM-PAC OT "6 Clicks" Daily Activity     Outcome Measure   Help from another person eating meals?: None Help from another person taking care of personal grooming?: None Help from another person toileting, which includes using toliet, bedpan, or urinal?: A Little Help from another person bathing (including washing, rinsing, drying)?: A Little Help from another person to put on and taking off regular upper body clothing?: None Help from another person to put on and taking off regular lower body clothing?: A  Little 6 Click Score: 21    End of Session Equipment Utilized During Treatment: Rolling walker (2 wheels)  OT Visit Diagnosis: Unsteadiness on feet (R26.81);Muscle weakness (generalized) (M62.81)   Activity Tolerance Patient tolerated treatment well   Patient Left in bed;with call bell/phone within reach   Nurse Communication Mobility status        Time: 1610-9604 OT Time Calculation (min): 37 min  Charges: OT General Charges $OT Visit: 1 Visit OT Treatments $Self Care/Home Management : 23-37 mins  Latina Craver, PhD, MS, OTR/L 04/23/22, 3:14 PM

## 2022-04-23 NOTE — Progress Notes (Signed)
Pt visible on the unit, ambulating on the unit with his front wheel walker. Pt denies SI/HI and AVH. Rt hip wound/post op surgical site assesses and dressed. He does his ADLs with minimal assistance. He took a shower and got the surgical site wet. Tegaderm removed, site cleaned with water and ABD gauze applied to cover. Pt denies SI/HI and AVH. No reports of anxiety or depression. Dr. Deeann Saint ortho surgeon informed the pt's surgical site needed to be assesses the wound/staples. I informed him one staple was loose at the end of the wound. Dr. Marlou Porch has seen the surgical site and he told me to inform the ortho surgeon to look at it as well.Pt quiet, slow to respond to  RN at times, because it takes him a little time to process communication. He has been up in the dayroom and outside with the group. He ambulates well with the front wheel walker. The tele sitter machine is intact in his room.

## 2022-04-23 NOTE — Progress Notes (Signed)
Physical Therapy Treatment Patient Details Name: John [name] MRN: [id number] DOB: May 17, 1941 Today's Date: 04/23/2022   History of Present Illness Pt is a 81 yo male with PMH of depression, anxiety, HTN, DM, OSA, prostate cancer, Horner syndrome, dissection of carotid artery, subdural hematoma, epidural hematoma. Previously hospitalized from 6/5-6/8 for severe depression and found to have abnormal EKG with A-fib. Pt receiving ECT for management of depression. Pt admitted 6/10 due to R femoral neck fx due to a fall in the  bathroom on the evening of 6/9. Pt underwent R hip hemiarthroplasty on 6/11. Pt discharged from inpatient unit and now admitted to [facility] psych unit.    PT Comments    Pt agreeable to PT and more engaging with conversation during today's session. Pt asked questions throughout session, but maintains flat affect. Pt able to vocalize 2/3 hip precautions with verbal cueing required for hip add. Pt ambulated >200 feet with RW and Supervision with a continuous step-through gait pattern which has improved significantly since pt is WBAT. Pt ascended/descended 12 steps with bilat rails step-to pattern and CGA with cues for sequencing. Pt reported no difficulty with steps as long as he has both railings. Continue to recommend [facility] PT at discharge.    Recommendations for follow up therapy are one component of a multi-disciplinary discharge planning process, led by the attending physician.  Recommendations may be updated based on patient status, additional functional criteria and insurance authorization.  Follow Up Recommendations  Home health PT     Assistance Recommended at Discharge Frequent or constant Supervision/Assistance  Patient can return home with the following Assist for transportation;Help with stairs or ramp for entrance;Assistance with cooking/housework   Equipment Recommendations  Rolling walker (2 wheels);BSC/3in1    Recommendations for Other Services       Precautions /  Restrictions Precautions Precautions: Posterior Hip;Fall Precaution Booklet Issued: No Restrictions Weight Bearing Restrictions: Yes RLE Weight Bearing: Weight bearing as tolerated (must use RW at all times)     Mobility  Bed Mobility Overal bed mobility: Modified Independent Bed Mobility: Supine to Sit     Supine to sit: Modified independent (Device/Increase time)     General bed mobility comments: mod I for supine to sit with increased time required    Transfers Overall transfer level: Needs assistance Equipment used: Rolling walker (2 wheels) Transfers: Sit to/from Stand Sit to Stand: Supervision           General transfer comment: verbal cues for maintaining posterior hip precaution of flexion by extending LE prior to sitting/standing    Ambulation/Gait Ambulation/Gait assistance: Supervision Gait Distance (Feet): 200 Feet Assistive device: Rolling walker (2 wheels) Gait Pattern/deviations: Step-through pattern Gait velocity: decreased     General Gait Details: Pt performed continuous gait pattern safely with RW now that WBAT through R LE.Continue to work on gait speed, with verbal cues provided for increasing speed   Stairs Stairs: Yes Stairs assistance: Min guard Stair Management: Two rails, Step to pattern, Forwards Number of Stairs: 12 General stair comments: Pt ascended/descended 12 steps with step-to gait pattern and bilat rails with CGA. Pt required verbal cueing for sequencing initially, and then was able to self-correct   Wheelchair Mobility    Modified Rankin (Stroke Patients Only)       Balance Overall balance assessment: Needs assistance Sitting-balance support: Feet supported Sitting balance-Leahy Scale: Good Sitting balance - Comments: Able to sit EOB completing dynamic sitting balance including LAQs without UE support. No LOB   Standing balance support:  Single extremity supported, During functional activity Standing balance-Leahy  Scale: Good Standing balance comment: Pt able to stand at [facility] with 1 UE support and the other receiving blood glucose check/insulin shot by nursing. No LOB                            Cognition Arousal/Alertness: Awake/alert Behavior During Therapy: Flat affect Overall Cognitive Status: No family/caregiver present to determine baseline cognitive functioning                                 General Comments: increased time to respond to questions, able to vocalize 2/3 hip precautions with verbal cueing required to remember adduction precaution; more engaged in conversation this date asking questions        Exercises Total Joint Exercises Heel Slides: AROM, Strengthening, Right, Supine, 15 reps Long Arc Quad: AROM, Right, 15 reps, Seated, Strengthening Other Exercises Other Exercises: During ambulation with RW and Supervision, pt stopped to receive blood glucose test and insulin shot from nursing.    General Comments        Pertinent Vitals/Pain Pain Assessment Pain Assessment: No/denies pain    Home Living                          Prior Function            PT Goals (current goals can now be found in the care plan section) Acute Rehab PT Goals Patient Stated Goal: to feel better PT Goal Formulation: With patient Time For Goal Achievement: 04/27/22 Potential to Achieve Goals: Good Progress towards PT goals: Progressing toward goals    Frequency    Min 2X/week      PT Plan Current plan remains appropriate    Co-evaluation              AM-PAC PT "6 Clicks" Mobility   Outcome Measure  Help needed turning from your back to your side while in a flat bed without using bedrails?: None Help needed moving from lying on your back to sitting on the side of a flat bed without using bedrails?: None Help needed moving to and from a bed to a chair (including a wheelchair)?: None Help needed standing up from a chair using your arms  (e.g., wheelchair or bedside chair)?: A Little Help needed to walk in hospital room?: A Little Help needed climbing 3-5 steps with a railing? : A Little 6 Click Score: 21    End of Session Equipment Utilized During Treatment: Gait belt Activity Tolerance: Patient tolerated treatment well Patient left: in chair;with call bell/phone within reach;with nursing/sitter in room Nurse Communication: Mobility status;Other (comment) (Patient request for assistance with showering) PT Visit Diagnosis: Muscle weakness (generalized) (M62.81);Difficulty in walking, not elsewhere classified (R26.2);Unsteadiness on feet (R26.81);Other abnormalities of gait and mobility (R26.89) Pain - Right/Left: Right Pain - part of body: Hip     Time: 5784-6962 PT Time Calculation (min) (ACUTE ONLY): 25 min  Charges:                        Phuong Hillary, SPT   Niley Helbig 04/23/2022, 11:14 AM

## 2022-04-24 ENCOUNTER — Inpatient Hospital Stay: Payer: Medicare Other | Admitting: Anesthesiology

## 2022-04-24 ENCOUNTER — Ambulatory Visit: Payer: Medicare Other | Attending: Psychiatry

## 2022-04-24 ENCOUNTER — Encounter: Payer: Self-pay | Admitting: Psychiatry

## 2022-04-24 DIAGNOSIS — F332 Major depressive disorder, recurrent severe without psychotic features: Secondary | ICD-10-CM | POA: Diagnosis not present

## 2022-04-24 LAB — GLUCOSE, CAPILLARY
Glucose-Capillary: 111 mg/dL — ABNORMAL HIGH (ref 70–99)
Glucose-Capillary: 123 mg/dL — ABNORMAL HIGH (ref 70–99)
Glucose-Capillary: 131 mg/dL — ABNORMAL HIGH (ref 70–99)

## 2022-04-24 MED ORDER — SUCCINYLCHOLINE CHLORIDE 200 MG/10ML IV SOSY
PREFILLED_SYRINGE | INTRAVENOUS | Status: DC | PRN
  Administered 2022-04-24: 100 mg via INTRAVENOUS

## 2022-04-24 MED ORDER — KETAMINE HCL 50 MG/5ML IJ SOSY
PREFILLED_SYRINGE | INTRAMUSCULAR | Status: AC
  Filled 2022-04-24: qty 5

## 2022-04-24 MED ORDER — SODIUM CHLORIDE 0.9 % IV SOLN: INTRAVENOUS | Status: DC | PRN

## 2022-04-24 MED ORDER — SODIUM CHLORIDE 0.9 % IV SOLN
500.0000 mL | Freq: Once | INTRAVENOUS | Status: AC
Start: 2022-04-24 — End: 2022-04-24
  Administered 2022-04-24: 500 mL via INTRAVENOUS

## 2022-04-24 MED ORDER — MIDAZOLAM HCL 2 MG/2ML IJ SOLN
INTRAMUSCULAR | Status: AC
  Filled 2022-04-24: qty 2

## 2022-04-24 MED ORDER — MIDAZOLAM HCL 2 MG/2ML IJ SOLN
2.0000 mg | Freq: Once | INTRAMUSCULAR | Status: DC
  Filled 2022-04-24: qty 2

## 2022-04-24 MED ORDER — KETAMINE HCL 10 MG/ML IJ SOLN
INTRAMUSCULAR | Status: DC | PRN
  Administered 2022-04-24: 100 mg via INTRAVENOUS

## 2022-04-24 MED ORDER — MIDAZOLAM HCL 2 MG/2ML IJ SOLN
INTRAMUSCULAR | Status: DC | PRN
  Administered 2022-04-24: 2 mg via INTRAVENOUS

## 2022-04-24 NOTE — Progress Notes (Signed)
PT Cancellation Note  Patient Details Name: John [name] MRN: [id number] DOB: 1941-01-22   Cancelled Treatment: Reason Eval/Treat Not Completed: Other (comment) Attempted to see pt this PM. Pt seated in day room upon arrival and reported dizziness. Nursing also stated that pt has been very dizzy and had been lying in bed for the past several hours and had just gotten up 10 minutes ago. Pt politely declined PT this date due to dizziness and wanting to eat something prior to exercising since he had not eaten since yesterday. Will re-attempt another day, as he is available.  Kolbi Tofte, SPT  Luismiguel Lamere 04/24/2022, 4:09 PM

## 2022-04-24 NOTE — H&P (Signed)
John [name] is an 81 y.o. male.   Chief Complaint: Chief complaint continues to be anxiety and worry about discharge.  Not reporting suicidal thought. HPI: Recurrent depression seems to shown some improvement possibly approaching her at baseline or at least plateau of improvement from ECT  Past Medical History:  Diagnosis Date   Cancer ([place])    Depression    Depression    Diabetes mellitus without complication ([place])    type 2   Dissection of carotid artery ([place])    a. 06/2015 CT Head: abnl thickening of mid-dist R cervical ICA w/o narrowing - ? vasculitis and thrombosed/healing dissection; b. 01/2021 Carotid U/S: 1-39% bilat ICA stenoses.   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 Age 14 - struck in head w/ baseball --> s/p craniotomy.   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 Approx 2015 - eval in [place] - conservatively managed.    Past Surgical History:  Procedure Laterality Date   ANKLE ARTHROSCOPY Left    fracture   CHOLECYSTECTOMY  2007   [place]   left frontal craniotomy for epidural hematoma   HIP ARTHROPLASTY Right 04/07/2022   Procedure: ARTHROPLASTY BIPOLAR HIP (HEMIARTHROPLASTY);  Surgeon: Earnestine Leys, MD;  Location: [facility] ORS;  Service: Orthopedics;  Laterality: Right;   INSERTION OF MESH  01/02/2022   Procedure: INSERTION OF MESH;  Surgeon: Herbert Pun, MD;  Location: [facility] ORS;  Service: General;;   LIGAMENT REPAIR Right    thumb   RETINAL DETACHMENT SURGERY     TREATMENT FISTULA ANAL  2006    Family History  Problem Relation Age of Onset   Osteoporosis Mother    Depression Mother    Prostate cancer Father     Hypertension Father    Diabetes Brother    Kidney disease Neg Hx    Social History:  reports that he has never smoked. He has never used smokeless tobacco. He reports that he does not currently use alcohol. He reports that he does not use drugs.  Allergies: No Known Allergies  Medications Prior to Admission  Medication Sig Dispense Refill   alum & mag hydroxide-simeth (MAALOX/MYLANTA) 200-200-20 MG/5ML suspension Take 30 mLs by mouth every 4 (four) hours as needed for indigestion. 355 mL 0   aspirin EC 81 MG tablet Take 81 mg by mouth daily.     buPROPion (WELLBUTRIN XL) 150 MG 24 hr tablet Take 1 tablet (150 mg total) by mouth daily. 30 tablet 0   docusate sodium (COLACE) 100 MG capsule Take 1 capsule (100 mg total) by mouth 2 (two) times daily. 10 capsule 0   ferrous sulfate 325 (65 FE) MG tablet Take 1 tablet (325 mg total) by mouth daily with breakfast.  3   HYDROcodone-acetaminophen (NORCO/VICODIN) 5-325 MG tablet Take 1-2 tablets by mouth every 4 (four) hours as needed for moderate pain (pain score 4-6). 30 tablet 0   insulin aspart (NOVOLOG) 100 UNIT/ML injection Inject 0-9 Units into the skin 3 (three) times daily with meals.  10 mL 11   insulin glargine-yfgn (SEMGLEE) 100 UNIT/ML injection Inject 0.08 mLs (8 Units total) into the skin 2 (two) times daily. 10 mL 11   LORazepam (ATIVAN) 1 MG tablet Take 1 tablet (1 mg total) by mouth every 4 (four) hours as needed for anxiety, sedation or sleep. 30 tablet 0   methocarbamol (ROBAXIN) 500 MG tablet Take 1 tablet (500 mg total) by mouth every 6 (six) hours as needed for muscle spasms.     metoprolol tartrate (LOPRESSOR) 25 MG tablet Take 1 tablet (25 mg total) by mouth 2 (two) times daily. 60 tablet 0   polyethylene glycol (MIRALAX / GLYCOLAX) 17 g packet Take 17 g by mouth daily as needed. 14 each 0   traZODone (DESYREL) 100 MG tablet Take 1 tablet (100 mg total) by mouth at bedtime as needed for sleep. 30 tablet 0    Results for  orders placed or performed during the hospital encounter of 04/11/22 (from the past 48 hour(s))  Glucose, capillary     Status: Abnormal   Collection Time: 04/22/22 10:08 PM  Result Value Ref Range   Glucose-Capillary 141 (H) 70 - 99 mg/dL    Comment: Glucose reference range applies only to samples taken after fasting for at least 8 hours.  Glucose, capillary     Status: Abnormal   Collection Time: 04/23/22  9:21 AM  Result Value Ref Range   Glucose-Capillary 172 (H) 70 - 99 mg/dL    Comment: Glucose reference range applies only to samples taken after fasting for at least 8 hours.   Comment 1 Notify RN   Glucose, capillary     Status: Abnormal   Collection Time: 04/23/22  9:11 PM  Result Value Ref Range   Glucose-Capillary 159 (H) 70 - 99 mg/dL    Comment: Glucose reference range applies only to samples taken after fasting for at least 8 hours.  Glucose, capillary     Status: Abnormal   Collection Time: 04/24/22  7:49 AM  Result Value Ref Range   Glucose-Capillary 111 (H) 70 - 99 mg/dL    Comment: Glucose reference range applies only to samples taken after fasting for at least 8 hours.  Glucose, capillary     Status: Abnormal   Collection Time: 04/24/22  2:13 PM  Result Value Ref Range   Glucose-Capillary 123 (H) 70 - 99 mg/dL    Comment: Glucose reference range applies only to samples taken after fasting for at least 8 hours.   No results found.  Review of Systems  Constitutional: Negative.   HENT: Negative.    Eyes: Negative.   Respiratory: Negative.    Cardiovascular: Negative.   Gastrointestinal: Negative.   Musculoskeletal: Negative.   Skin: Negative.   Neurological: Negative.   Psychiatric/Behavioral:  The patient is nervous/anxious.   All other systems reviewed and are negative.   Blood pressure (!) 147/94, pulse 71, temperature 98.1 F (36.7 C), temperature source Oral, resp. rate 18, height '6\' 2"'$  (1.88 m), weight 87.8 kg, SpO2 96 %. Physical Exam Vitals  reviewed.  Constitutional:      Appearance: He is well-developed.  HENT:     Head: Normocephalic and atraumatic.  Eyes:     Conjunctiva/sclera: Conjunctivae normal.     Pupils: Pupils are equal, round, and reactive to light.  Cardiovascular:     Heart sounds: Normal heart sounds.  Pulmonary:     Effort: Pulmonary effort is normal.  Abdominal:     Palpations: Abdomen is soft.  Musculoskeletal:        General: Normal range of motion.     Cervical back: Normal range of motion.  Skin:    General: Skin is warm and dry.  Neurological:     General: No focal deficit present.     Mental Status: He is alert.  Psychiatric:        Mood and Affect: Mood is anxious.      Assessment/Plan Reevaluate tomorrow to see if we need to do treatment Friday  [name], MD 04/24/2022, 5:09 PM

## 2022-04-24 NOTE — Progress Notes (Signed)
Pt seen in room laying in bed.He is cooperative and pleasant, denies SI/HI/AVH. He rated his anxiety 2/10 and depressed mood as 2/10. He reports sleeping better last night. Appetite is good. Pt did not receive AM meds due to ECT this AM. Continue to have telesitter for fall risk.

## 2022-04-24 NOTE — Progress Notes (Signed)
Patient returned from ECT with staff. He is lethargic, dizzy, and mildly confused. Will continue to monitor for changes.

## 2022-04-24 NOTE — Progress Notes (Signed)
Patient left unit with staff for ECT.

## 2022-04-24 NOTE — Progress Notes (Signed)
OT Cancellation Note  Patient Details Name: Cloud Graham MRN: [id number] DOB: 1940-11-10   Cancelled Treatment:    Reason Eval/Treat Not Completed: Patient at procedure or test/ unavailable. Made 2 attempts to see pt this day. In AM, pt was receiving ECT treatment. Went back to see pt in afternoon, but he was in bed, reporting he was very tired after ECT, and declined therapy. Will see pt another day, as he is available.   Josiah Lobo 04/24/2022, 3:18 PM

## 2022-04-24 NOTE — Transfer of Care (Signed)
Immediate Anesthesia Transfer of Care Note  Patient: Horrace Hanak  Procedure(s) Performed: ECT TX  Patient Location: PACU  Anesthesia Type:General  Level of Consciousness: drowsy  Airway & Oxygen Therapy: Patient Spontanous Breathing and Patient connected to nasal cannula oxygen  Post-op Assessment: Report given to RN and Post -op Vital signs reviewed and stable  Post vital signs: Reviewed and stable  Last Vitals:  Vitals Value Taken Time  BP 135/81 04/24/22 1408  Temp 36.8 C 04/24/22 1408  Pulse 69 04/24/22 1418  Resp 17 04/24/22 1417  SpO2 99 % 04/24/22 1418  Vitals shown include unvalidated device data.  Last Pain:  Vitals:   04/24/22 1408  TempSrc:   PainSc: Asleep         Complications: No notable events documented.

## 2022-04-24 NOTE — Progress Notes (Signed)
[facility] MD Progress Note  04/24/2022 11:03 AM John [name]  MRN:  [id number] Subjective: John [name] is seen on rounds today.  He is doing better.  He states that he slept well and his appetite is good.  Nursing is contacting ortho to take a look at his staples and possible removal.  I spoke with his wife yesterday and told her that physical therapy and I think that Kaci can go home by this weekend.  Alain is a little reluctant but I think he is doing well enough to be discharged soon.  The meantime, he has ECT today and we are going to titrate up on his Abilify.  He is still depressed but not suicidal.  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 06/2015 CT Head: abnl thickening of mid-dist R cervical ICA w/o narrowing - ? vasculitis and thrombosed/healing dissection; b. 01/2021 Carotid U/S: 1-39% bilat ICA stenoses.   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 Age 62 - struck in head w/ baseball --> s/p craniotomy.   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 Approx 2015 - eval in [place] - conservatively  managed.    Past Surgical History:  Procedure Laterality Date   ANKLE ARTHROSCOPY Left    fracture   CHOLECYSTECTOMY  2007   [place]   left frontal craniotomy for epidural hematoma   HIP ARTHROPLASTY Right 04/07/2022   Procedure: ARTHROPLASTY BIPOLAR HIP (HEMIARTHROPLASTY);  Surgeon: Earnestine Leys, MD;  Location: [facility] ORS;  Service: Orthopedics;  Laterality: Right;   INSERTION OF MESH  01/02/2022   Procedure: INSERTION OF MESH;  Surgeon: Herbert Pun, MD;  Location: [facility] ORS;  Service: General;;   LIGAMENT REPAIR Right    thumb   RETINAL DETACHMENT SURGERY     TREATMENT FISTULA ANAL  2006   Family History:  Family History  Problem Relation Age of Onset   Osteoporosis Mother    Depression Mother    Prostate cancer Father    Hypertension Father    Diabetes Brother    Kidney disease Neg Hx    Social History:  Social History   Substance and Sexual Activity  Alcohol Use Not Currently     Social History   Substance and Sexual Activity  Drug Use No    Social History   Socioeconomic History   Marital status: Married    Spouse name: [name]   Number of children: 2   Years of education: Not  on file   Highest education level: Not on file  Occupational History   Not on file  Tobacco Use   Smoking status: Never   Smokeless tobacco: Never  Vaping Use   Vaping Use: Never used  Substance and Sexual Activity   Alcohol use: Not Currently   Drug use: No   Sexual activity: Yes    Birth control/protection: None  Other Topics Concern   Not on file  Social History Narrative   Live at home with wife.    Social Determinants of Health   Financial Resource Strain: Not on file  Food Insecurity: Not on file  Transportation Needs: Not on file  Physical Activity: Not on file  Stress: Not on file  Social Connections: Not on file   Additional Social History:                         Sleep: Good  Appetite:  Good  Current Medications: Current  Facility-Administered Medications  Medication Dose Route Frequency Provider Last Rate Last Admin   acetaminophen (TYLENOL) tablet 325-650 mg  325-650 mg Oral Q6H PRN Clapacs, [name], MD   650 mg at 04/21/22 0844   alum & mag hydroxide-simeth (MAALOX/MYLANTA) 200-200-20 MG/5ML suspension 30 mL  30 mL Oral Q4H PRN Clapacs, [name], MD       ARIPiprazole (ABILIFY) tablet 4 mg  4 mg Oral QPC breakfast Parks Ranger, DO       aspirin EC tablet 81 mg  81 mg Oral Daily Clapacs, [name], MD   81 mg at 04/23/22 1937   bisacodyl (DULCOLAX) suppository 10 mg  10 mg Rectal Daily PRN Clapacs, [name], MD       buPROPion (WELLBUTRIN XL) 24 hr tablet 300 mg  300 mg Oral Daily Clapacs, John T, MD   300 mg at 04/23/22 9024   docusate sodium (COLACE) capsule 100 mg  100 mg Oral BID Clapacs, John T, MD   100 mg at 04/23/22 2122   fentaNYL (SUBLIMAZE) injection 25 mcg  25 mcg Intravenous Q5 min PRN [place] Service, Gijsbertus F, MD       ferrous sulfate tablet 325 mg  325 mg Oral Q breakfast Clapacs, [name], MD   325 mg at 04/23/22 0973   HYDROcodone-acetaminophen (NORCO/VICODIN) 5-325 MG per tablet 1-2 tablet  1-2 tablet Oral Q4H PRN Clapacs, [name], MD   2 tablet at 04/18/22 0200   insulin glargine-yfgn (SEMGLEE) injection 5 Units  5 Units Subcutaneous BID Clapacs, [name], MD   5 Units at 04/23/22 2124   menthol-cetylpyridinium (CEPACOL) lozenge 3 mg  1 lozenge Oral PRN Clapacs, [name], MD       Or   phenol (CHLORASEPTIC) mouth spray 1 spray  1 spray Mouth/Throat PRN Clapacs, [name], MD       methocarbamol (ROBAXIN) tablet 500 mg  500 mg Oral Q6H PRN Clapacs, [name], MD       Or   methocarbamol (ROBAXIN) 500 mg in dextrose 5 % 50 mL IVPB  500 mg Intravenous Q6H PRN Clapacs, John T, MD       metoprolol tartrate (LOPRESSOR) tablet 25 mg  25 mg Oral BID Clapacs, John T, MD   25 mg at 04/23/22 2122   midazolam (VERSED) injection 2 mg  2 mg Intravenous Once Clapacs, [name], MD       midazolam (VERSED) injection 2  mg  2 mg Intravenous Once Clapacs, John  T, MD       ondansetron (ZOFRAN) injection 4 mg  4 mg Intravenous Once PRN [place] Service, Gijsbertus F, MD       polyethylene glycol (MIRALAX / GLYCOLAX) packet 17 g  17 g Oral Daily Clapacs, [name], MD   17 g at 04/23/22 4010   prazosin (MINIPRESS) capsule 1 mg  1 mg Oral QHS Clapacs, John T, MD   1 mg at 04/23/22 2122   senna (SENOKOT) tablet 8.6 mg  1 tablet Oral BID Clapacs, John T, MD   8.6 mg at 04/23/22 2122   sodium phosphate (FLEET) 7-19 GM/118ML enema 1 enema  1 enema Rectal Once PRN Clapacs, [name], MD       tamsulosin (FLOMAX) capsule 0.4 mg  0.4 mg Oral QPC supper Clapacs, John T, MD   0.4 mg at 04/23/22 1801   traZODone (DESYREL) tablet 50 mg  50 mg Oral QHS PRN Clapacs, [name], MD   50 mg at 04/21/22 2132   zolpidem (AMBIEN) tablet 5 mg  5 mg Oral QHS PRN Clapacs, [name], MD        Lab Results:  Results for orders placed or performed during the hospital encounter of 04/11/22 (from the past 48 hour(s))  Glucose, capillary     Status: Abnormal   Collection Time: 04/22/22  4:00 PM  Result Value Ref Range   Glucose-Capillary 162 (H) 70 - 99 mg/dL    Comment: Glucose reference range applies only to samples taken after fasting for at least 8 hours.   Comment 1 Notify RN   Glucose, capillary     Status: Abnormal   Collection Time: 04/22/22 10:08 PM  Result Value Ref Range   Glucose-Capillary 141 (H) 70 - 99 mg/dL    Comment: Glucose reference range applies only to samples taken after fasting for at least 8 hours.  Glucose, capillary     Status: Abnormal   Collection Time: 04/23/22  9:21 AM  Result Value Ref Range   Glucose-Capillary 172 (H) 70 - 99 mg/dL    Comment: Glucose reference range applies only to samples taken after fasting for at least 8 hours.   Comment 1 Notify RN   Glucose, capillary     Status: Abnormal   Collection Time: 04/23/22  9:11 PM  Result Value Ref Range   Glucose-Capillary 159 (H) 70 - 99 mg/dL    Comment:  Glucose reference range applies only to samples taken after fasting for at least 8 hours.  Glucose, capillary     Status: Abnormal   Collection Time: 04/24/22  7:49 AM  Result Value Ref Range   Glucose-Capillary 111 (H) 70 - 99 mg/dL    Comment: Glucose reference range applies only to samples taken after fasting for at least 8 hours.    Blood Alcohol level:  Lab Results  Component Value Date   ETH <10 27/25/3664    Metabolic Disorder Labs: Lab Results  Component Value Date   HGBA1C 5.6 03/19/2022   MPG 114.02 03/19/2022   No results found for: "PROLACTIN" Lab Results  Component Value Date   CHOL 131 03/19/2022   TRIG 92 03/19/2022   HDL 33 (L) 03/19/2022   CHOLHDL 4.0 03/19/2022   VLDL 18 03/19/2022   LDLCALC 80 03/19/2022    Physical Findings: AIMS: Facial and Oral Movements Muscles of Facial Expression: None, normal Lips and Perioral Area: None, normal Jaw: None, normal Tongue: None, normal,Extremity Movements Upper (arms, wrists, hands, fingers): None, normal  Lower (legs, knees, ankles, toes): Moderate (Rt. leg & hip pain. S/P Rt. hip surgery), Trunk Movements Neck, shoulders, hips: None, normal, Overall Severity Severity of abnormal movements (highest score from questions above): Moderate (R/T to Rt. hip surgery) Incapacitation due to abnormal movements: None, normal Patient's awareness of abnormal movements (rate only patient's report): No Awareness, Dental Status Current problems with teeth and/or dentures?: No Does patient usually wear dentures?: No  CIWA:    COWS:     Musculoskeletal: Strength & Muscle Tone: within normal limits Gait & Station: unsteady Patient leans: N/A  Psychiatric Specialty Exam:  Presentation  General Appearance: Appropriate for Environment  Eye Contact:Good  Speech:Clear and Coherent  Speech Volume:Normal  Handedness:Right   Mood and Affect  Mood:Anxious (stated "a little anxious")  Affect:Congruent   Thought  Process  Thought Processes:Linear; Coherent; Goal Directed  Descriptions of Associations:Intact  Orientation:Full (Time, Place and Person)  Thought Content:Logical; WDL  History of Schizophrenia/Schizoaffective disorder:No  Duration of Psychotic Symptoms:No data recorded Hallucinations:No data recorded Ideas of Reference:None  Suicidal Thoughts:No data recorded Homicidal Thoughts:No data recorded  Sensorium  Memory:Immediate Good  Judgment:Good  Insight:Good   Executive Functions  Concentration:Good  Attention Span:Good  Recall:Good  Fund of Knowledge:Good  Language:Good   Psychomotor Activity  Psychomotor Activity:No data recorded  Assets  Assets:Housing; Catering manager; Desire for Improvement; Communication Skills; Resilience; Social Support   Sleep  Sleep:No data recorded   Physical Exam: Physical Exam Vitals and nursing note reviewed.  Constitutional:      Appearance: Normal appearance. He is normal weight.  Neurological:     General: No focal deficit present.     Mental Status: He is alert and oriented to person, place, and time.  Psychiatric:        Attention and Perception: Attention and perception normal.        Mood and Affect: Mood is anxious and depressed.        Speech: Speech normal.        Behavior: Behavior normal. Behavior is cooperative.        Thought Content: Thought content normal.        Cognition and Memory: Cognition and memory normal.        Judgment: Judgment normal.    Review of Systems  Constitutional: Negative.   HENT: Negative.    Eyes: Negative.   Respiratory: Negative.    Cardiovascular: Negative.   Gastrointestinal: Negative.   Genitourinary: Negative.   Musculoskeletal: Negative.   Skin: Negative.   Neurological: Negative.   Endo/Heme/Allergies: Negative.   Psychiatric/Behavioral:  Positive for depression.    Blood pressure (!) 144/88, pulse 64, temperature 97.9 F (36.6 C), temperature  source Oral, resp. rate 18, height '6\' 2"'$  (1.88 m), weight 87.8 kg, SpO2 98 %. Body mass index is 24.84 kg/m.   Treatment Plan Summary: Daily contact with patient to assess and evaluate symptoms and progress in treatment, Medication management, and Plan increase Abilify to 4 mg/day.  [place], DO 04/24/2022, 11:03 AM

## 2022-04-24 NOTE — Progress Notes (Signed)
34 staples removed from right hip without complication. Area clean, dried, and covered with a dry dressing. Will monitor for changes.

## 2022-04-24 NOTE — Progress Notes (Signed)
Pt is observed in the dayroom at the beginning of the shift. He seems tired and says he wants to go to his room to lay down. He is calm and cooperative. AAOx4. He denies SI, HI, AVH, and depression. He rates is anxiety a 5/10. Pt stated that his anxiety is centered around discharge. He is medication compliant. He is in no current distress. Tele sitter in place for fall risk. Q 15 min safety checks in place.

## 2022-04-24 NOTE — Anesthesia Preprocedure Evaluation (Signed)
Anesthesia Evaluation  Patient identified by MRN, date of birth, ID band Patient awake    Reviewed: Allergy & Precautions, H&P , NPO status , Patient's Chart, lab work & pertinent test results, reviewed documented beta blocker date and time   History of Anesthesia Complications (+) PONV and history of anesthetic complications  Airway Mallampati: II  TM Distance: <3 FB Neck ROM: full    Dental  (+) Poor Dentition, Chipped   Pulmonary sleep apnea and Continuous Positive Airway Pressure Ventilation ,    Pulmonary exam normal        Cardiovascular Exercise Tolerance: Poor hypertension, On Medications negative cardio ROS Normal cardiovascular exam     Neuro/Psych  Headaches, PSYCHIATRIC DISORDERS Depression    GI/Hepatic negative GI ROS, Neg liver ROS,   Endo/Other  negative endocrine ROSdiabetes  Renal/GU negative Renal ROS  negative genitourinary   Musculoskeletal   Abdominal   Peds  Hematology negative hematology ROS (+)   Anesthesia Other Findings Past Medical History: No date: Cancer ([place]) No date: Depression No date: Depression No date: Diabetes mellitus without complication (HCC)     Comment:  type 2 No date: 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 06/2015 CT Head: abnl thickening of mid-dist R               cervical ICA w/o narrowing - ? vasculitis and               thrombosed/healing dissection; b. 01/2021 Carotid U/S:               1-39% bilat ICA stenoses. No date: Eczema No date: Elevated PSA No date: Epidural hematoma (HCC)     Comment:  a. Age 56 - struck in head w/ baseball --> s/p               craniotomy. No date: Epidural hematoma (HCC) No date: 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, congenital bilateral: Hearing loss No date: Hemorrhoids 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 No date: Hypertension No date: Migraine No date: Obesity No date: PONV (postoperative nausea and  vomiting) 2020: Prostate cancer ([place]) No date: Rosacea No date: SDH (subdural hematoma) (HCC) No date: Sleep apnea No date: Spontaneous Subdural hematoma (HCC)     Comment:  a. Approx 2015 - eval [place] - conservatively managed. Past Surgical History: No date: ANKLE ARTHROSCOPY; Left     Comment:  fracture 2007: CHOLECYSTECTOMY 1958: CRANIOTOMY     Comment:  left frontal craniotomy for epidural hematoma 04/07/2022: HIP ARTHROPLASTY; Right     Comment:  Procedure: ARTHROPLASTY BIPOLAR HIP (HEMIARTHROPLASTY);               Surgeon: Earnestine Leys, MD;  Location: [facility] ORS;                Service: Orthopedics;  Laterality: Right; 01/02/2022: INSERTION OF MESH     Comment:  Procedure: INSERTION OF MESH;  Surgeon: Herbert Pun, MD;  Location: [facility] ORS;  Service: General;; No date: LIGAMENT REPAIR; Right     Comment:  thumb No date: RETINAL DETACHMENT SURGERY 2006: TREATMENT FISTULA ANAL BMI    Body Mass Index: 24.84 kg/m     Reproductive/Obstetrics negative OB ROS                             Anesthesia Physical  Anesthesia Plan  ASA: 3  Anesthesia Plan: General   Post-op Pain Management:    Induction: Intravenous  PONV Risk Score and Plan: TIVA  Airway Management Planned: Mask  Additional Equipment:   Intra-op Plan:   Post-operative Plan:   Informed Consent: I have reviewed the patients History and Physical, chart, labs and discussed the procedure including the risks, benefits and alternatives for the proposed anesthesia with the patient or authorized representative who has indicated his/her understanding and acceptance.     Dental Advisory Given  Plan Discussed with: CRNA  Anesthesia Plan Comments: (Patient consented for risks of anesthesia including but not limited to:  - adverse reactions to medications - risk of airway placement if required - damage to eyes, teeth, lips or other oral mucosa - nerve damage due to  positioning  - sore throat or hoarseness - Damage to heart, brain, nerves, lungs, other parts of body or loss of life  Patient voiced understanding.)        Anesthesia Quick Evaluation

## 2022-04-24 NOTE — Procedures (Signed)
ECT SERVICES Physician's Interval Evaluation & Treatment Note  Patient Identification: John [name] MRN:  [id number] Date of Evaluation:  04/24/2022 TX #: [id number]  MADRS:   MMSE:   P.E. Findings:  No change to physical exam  Psychiatric Interval Note:  Still anxious  Subjective:  Patient is a 81 y.o. male seen for evaluation for Electroconvulsive Therapy. Anxious but not suicidal.  Some memory impairment.  Treatment Summary:   '[x]'$   Right Unilateral             '[]'$  Bilateral   % Energy : 0.3 ms 100%   Impedance: 2400 ohms  Seizure Energy Index: 3056 V squared  Postictal Suppression Index: 43%  Seizure Concordance Index: 97%  Medications  Pre Shock: Robinul 0.1 mg Toradol 30 mg labetalol 10 mg ketamine 100 mg succinylcholine 100 mg  Post Shock:    Seizure Duration: 32 seconds EMG 63 seconds EEG   Comments: Tolerated ketamine fine and had much better seizure.  Next treatment probably Friday depending on his appearance tomorrow  Lungs:  '[x]'$   Clear to auscultation               '[]'$  Other:   Heart:    '[x]'$   Regular rhythm             '[]'$  irregular rhythm    '[x]'$   Previous H&P reviewed, patient examined and there are NO CHANGES                 '[]'$   Previous H&P reviewed, patient examined and there are changes noted.   [name], MD 6/28/20235:27 PM

## 2022-04-24 NOTE — B[facility] Counselor (Signed)
CSW contacted pt's wife [name] to discuss discharge planning. CSW informed her that would be scheduled for discharge Friday. She stated she was excited that pt would be returning home. She stated that she would be renting the recommended PT equipment. CSW informed pt that she would assist with outpatient PT referral.   Tarah Buboltz [place], MSW, LCSW-A 6/28/20233:57 PM

## 2022-04-25 DIAGNOSIS — F332 Major depressive disorder, recurrent severe without psychotic features: Secondary | ICD-10-CM | POA: Diagnosis not present

## 2022-04-25 LAB — GLUCOSE, CAPILLARY
Glucose-Capillary: 346 mg/dL — ABNORMAL HIGH (ref 70–99)
Glucose-Capillary: 213 mg/dL — ABNORMAL HIGH (ref 70–99)

## 2022-04-25 MED ORDER — ARIPIPRAZOLE 5 MG PO TABS
5.0000 mg | ORAL_TABLET | Freq: Every day | ORAL | Status: DC
  Administered 2022-04-26: 5 mg via ORAL
  Filled 2022-04-25: qty 1

## 2022-04-25 NOTE — Progress Notes (Signed)
Physical Therapy Treatment Patient Details Name: John [name] MRN: [id number] DOB: 26-Feb-1941 Today's Date: 04/25/2022   History of Present Illness Pt is a 81 yo male with PMH of depression, anxiety, HTN, DM, OSA, prostate cancer, Horner syndrome, dissection of carotid artery, subdural hematoma, epidural hematoma. Previously hospitalized from 6/5-6/8 for severe depression and found to have abnormal EKG with A-fib. Pt receiving ECT for management of depression. Pt admitted 6/10 due to R femoral neck fx due to a fall in the  bathroom on the evening of 6/9. Pt underwent R hip hemiarthroplasty on 6/11. Pt discharged from inpatient unit and now admitted to [facility] psych unit.    PT Comments    Pt has met all PT goals for discharge from physical therapy this date. Pt was agreeable to PT and plan for discharging orders. Pt was engaged in conversation and asking questions today. PT answered all of pt questions with regard to mobility concerns such as showering safely, sleeping positions while maintaining precautions, and sequencing stairs. Pt requested to complete stairs again today and was able to ascend with bilat rails and step-to pattern and descend with bilat rails and reciprocal pattern. Pt reported no increase in pain with mobility today. Pt also completed standing there-ex of mini squats and 3-way hip while standing at bathroom sink with bilat UE support. Pt required verbal cues for technique, but was able to correct and complete exercises safely with SBA. Recommend HH PT at discharge to continue to work on strengthening, balance, and endurance.   Recommendations for follow up therapy are one component of a multi-disciplinary discharge planning process, led by the attending physician.  Recommendations may be updated based on patient status, additional functional criteria and insurance authorization.  Follow Up Recommendations  Home health PT     Assistance Recommended at Discharge Intermittent  Supervision/Assistance  Patient can return home with the following Help with stairs or ramp for entrance;Assist for transportation;Assistance with cooking/housework;A little help with bathing/dressing/bathroom   Equipment Recommendations  Rolling walker (2 wheels);BSC/3in1    Recommendations for Other Services       Precautions / Restrictions Precautions Precautions: Posterior Hip;Fall Precaution Booklet Issued: No Restrictions Weight Bearing Restrictions: Yes RLE Weight Bearing: Weight bearing as tolerated     Mobility  Bed Mobility               General bed mobility comments: Pt received in day room and left seated in room    Transfers Overall transfer level: Needs assistance Equipment used: Rolling walker (2 wheels) Transfers: Sit to/from Stand Sit to Stand: Supervision           General transfer comment: pt stands safely with R LE forward and utilizing bilat UEs to push from chair    Ambulation/Gait Ambulation/Gait assistance: Supervision Gait Distance (Feet): 150 Feet Assistive device: Rolling walker (2 wheels) Gait Pattern/deviations: Step-through pattern       General Gait Details: Pt performed continuous gait pattern safely with RW.   Stairs Stairs: Yes Stairs assistance: Supervision Stair Management: Two rails, Step to pattern, Forwards, Alternating pattern Number of Stairs: 8 General stair comments: Pt ascended/descended 8 steps ascending with step-to gait pattern and descending with reciprocal pattern and bilat rails with Supervision. Pt required verbal cueing for sequencing initially, and then was able to self-correct.   Wheelchair Mobility    Modified Rankin (Stroke Patients Only)       Balance Overall balance assessment: Needs assistance Sitting-balance support: Feet supported Sitting balance-Leahy Scale: Good Sitting balance -  Comments: No LOB or UE support in sitting   Standing balance support: During functional activity,  Bilateral upper extremity supported, No upper extremity supported Standing balance-Leahy Scale: Good Standing balance comment: Able to stand at [facility] without utilizing UE support when static standing; utilizes bilat UE support on RW to ambulate; no LOB                            Cognition Arousal/Alertness: Awake/alert Behavior During Therapy: Flat affect Overall Cognitive Status: No family/caregiver present to determine baseline cognitive functioning                                 General Comments: flat affect but participating in conversation today and asking questions about PT discharge        Exercises Other Exercises Other Exercises: Physical therapy discharge conversation and pt questions answers with pt education on sidelying sleeping, showering safely, sequencing steps. Other Exercises: Pt completed ther-ex standing at bathroom sink with bilat UE support: mini squats x15 reps, standing hip abd/ext/flex x15 reps ea. Verbal cues required for exercise technique.    General Comments        Pertinent Vitals/Pain Pain Assessment Pain Assessment: No/denies pain    Home Living                          Prior Function            PT Goals (current goals can now be found in the care plan section) Acute Rehab PT Goals Patient Stated Goal: to feel better PT Goal Formulation: With patient Time For Goal Achievement: 04/25/22 Potential to Achieve Goals: Good Progress towards PT goals: Goals met/education completed, patient discharged from PT    Frequency           PT Plan Current plan remains appropriate    Co-evaluation              AM-PAC PT "6 Clicks" Mobility   Outcome Measure  Help needed turning from your back to your side while in a flat bed without using bedrails?: None Help needed moving from lying on your back to sitting on the side of a flat bed without using bedrails?: None Help needed moving to and from a bed to a  chair (including a wheelchair)?: None Help needed standing up from a chair using your arms (e.g., wheelchair or bedside chair)?: A Little Help needed to walk in hospital room?: A Little Help needed climbing 3-5 steps with a railing? : A Little 6 Click Score: 21    End of Session Equipment Utilized During Treatment: Gait belt Activity Tolerance: Patient tolerated treatment well Patient left: in chair;with call bell/phone within reach Nurse Communication: Mobility status PT Visit Diagnosis: Muscle weakness (generalized) (M62.81);Difficulty in walking, not elsewhere classified (R26.2);Unsteadiness on feet (R26.81);Other abnormalities of gait and mobility (R26.89) Pain - Right/Left: Right Pain - part of body: Hip     Time: 5859-2924 PT Time Calculation (min) (ACUTE ONLY): 31 min  Charges:                       John [name], SPT  John [name] 04/25/2022, 1:14 PM

## 2022-04-25 NOTE — Progress Notes (Signed)
Occupational Therapy Treatment Patient Details Name: John [name] MRN: [id number] DOB: 1941/08/05 Today's Date: 04/25/2022   History of present illness Pt is a 81 yo male with PMH of depression, anxiety, HTN, DM, OSA, prostate cancer, Horner syndrome, dissection of carotid artery, subdural hematoma, epidural hematoma. Previously hospitalized from 6/5-6/8 for severe depression and found to have abnormal EKG with A-fib. Pt receiving ECT for management of depression. Pt admitted 6/10 due to R femoral neck fx due to a fall in the  bathroom on the evening of 6/9. Pt underwent R hip hemiarthroplasty on 6/11. Pt discharged from inpatient unit and now admitted to [facility] psych unit.   OT comments  Chart reviewed, RN cleared pt for participation for OT tx session. Pt greeted in his room, flat affect, agreeable to OT tx session. Pt performed standing ADL with MOD I-supervision, amb in room and hallway with RW with supervision. Pt asking thoughtful questions regarding home set up for safe ADL completion, all questions answered as able. Pt is making progress towards all goals, will continue to benefit from ongoing OT to address functional deficits. Discharge recommendation remains appropriate.    Recommendations for follow up therapy are one component of a multi-disciplinary discharge planning process, led by the attending physician.  Recommendations may be updated based on patient status, additional functional criteria and insurance authorization.    Follow Up Recommendations  No OT follow up    Assistance Recommended at Discharge Intermittent Supervision/Assistance  Patient can return home with the following  A little help with bathing/dressing/bathroom   Equipment Recommendations  Other (comment) (RW)    Recommendations for Other Services      Precautions / Restrictions Precautions Precautions: Posterior Hip;Fall Precaution Booklet Issued: No Restrictions Weight Bearing Restrictions: Yes RLE Weight  Bearing: Weight bearing as tolerated       Mobility Bed Mobility               General bed mobility comments: pt at edge of bed pre/post session    Transfers Overall transfer level: Needs assistance Equipment used: Rolling walker (2 wheels) Transfers: Sit to/from Stand Sit to Stand: Supervision                 Balance Overall balance assessment: Needs assistance Sitting-balance support: Feet supported Sitting balance-Leahy Scale: Good     Standing balance support: During functional activity, Bilateral upper extremity supported, No upper extremity supported Standing balance-Leahy Scale: Good                             ADL either performed or assessed with clinical judgement   ADL Overall ADL's : Needs assistance/impaired     Grooming: Wash/dry hands;Wash/dry face;Standing;Modified independent                   [employer]ed forces technical officer: Regular Toilet;Rolling walker (2 wheels);Supervision/safety Toilet Transfer Details (indicate cue type and reason): simulated         Functional mobility during ADLs: Supervision/safety;Rolling walker (2 wheels)      Extremity/Trunk Assessment              Vision       Perception     Praxis      Cognition Arousal/Alertness: Awake/alert Behavior During Therapy: Flat affect Overall Cognitive Status: No family/caregiver present to determine baseline cognitive functioning  General Comments: alert and oriented x4, fair-good safety awareness, good awareness of deficits for safety concerns following discharge home        Exercises Other Exercises Other Exercises: edu re: safe IADL completion at home, use of DME, falls prevention, home safety    Shoulder Instructions       General Comments      Pertinent Vitals/ Pain       Pain Assessment Pain Assessment: No/denies pain  Home Living                                           Prior Functioning/Environment              Frequency  Min 2X/week        Progress Toward Goals  OT Goals(current goals can now be found in the care plan section)  Progress towards OT goals: Progressing toward goals     Plan Discharge plan remains appropriate;Frequency remains appropriate    Co-evaluation                 AM-PAC OT "6 Clicks" Daily Activity     Outcome Measure   Help from another person eating meals?: None Help from another person taking care of personal grooming?: None Help from another person toileting, which includes using toliet, bedpan, or urinal?: A Little Help from another person bathing (including washing, rinsing, drying)?: A Little Help from another person to put on and taking off regular upper body clothing?: None Help from another person to put on and taking off regular lower body clothing?: A Little 6 Click Score: 21    End of Session Equipment Utilized During Treatment: Rolling walker (2 wheels)  OT Visit Diagnosis: Unsteadiness on feet (R26.81);Muscle weakness (generalized) (M62.81)   Activity Tolerance Patient tolerated treatment well   Patient Left with call bell/phone within reach (seated at edge of bed)   Nurse Communication Mobility status        Time: 3235-5732 OT Time Calculation (min): 17 min  Charges: OT General Charges $OT Visit: 1 Visit OT Treatments $Therapeutic Activity: 8-22 mins  Shanon Payor, OTD OTR/L  04/25/22, 3:16 PM

## 2022-04-25 NOTE — Progress Notes (Signed)
Pt is seen in the dayroom with visitor at the beginning of the shift. He is calm and cooperative. He denies SI, HI, AVH, anxiety, and depression. Pt complained of experiencing some "brain fog". He is in no current distress at this time. Q 15 min safety checks in place.

## 2022-04-25 NOTE — Progress Notes (Addendum)
Patient has been assessed, Gait is steady, thoughts are clear, and patient is able to ambulate with walker independently. Tele sitter discontinued. Will continue to monitor for changes.

## 2022-04-25 NOTE — Progress Notes (Signed)
[facility] MD Progress Note  04/25/2022 12:51 PM John [name]  MRN:  [id number] Subjective: John [name] is doing much better.  Dr. Weber Cooks has done his last ECT.  I told Angel that he is ready for discharge either tomorrow or Saturday or Sunday.  In the meantime I will go up on his Abilify to 5 mg/day.  His mood and affect are improved and he is reluctant to go home but I told him it is time.  Ordered home health PT and RN for his wound and spoke with social work about follow-up and discussing these things with his wife.  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 06/2015 CT Head: abnl thickening of mid-dist R cervical ICA w/o narrowing - ? vasculitis and thrombosed/healing dissection; b. 01/2021 Carotid U/S: 1-39% bilat ICA stenoses.   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 Age 81 - struck in head w/ baseball --> s/p craniotomy.   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 Approx 2015 - eval in [place] - conservatively managed.    Past Surgical History:  Procedure Laterality Date   ANKLE ARTHROSCOPY Left     fracture   CHOLECYSTECTOMY  2007   [place]   left frontal craniotomy for epidural hematoma   HIP ARTHROPLASTY Right 04/07/2022   Procedure: ARTHROPLASTY BIPOLAR HIP (HEMIARTHROPLASTY);  Surgeon: Earnestine Leys, MD;  Location: [facility] ORS;  Service: Orthopedics;  Laterality: Right;   INSERTION OF MESH  01/02/2022   Procedure: INSERTION OF MESH;  Surgeon: Herbert Pun, MD;  Location: [facility] ORS;  Service: General;;   LIGAMENT REPAIR Right    thumb   RETINAL DETACHMENT SURGERY     TREATMENT FISTULA ANAL  2006   Family History:  Family History  Problem Relation Age of Onset   Osteoporosis Mother    Depression Mother    Prostate cancer Father    Hypertension Father    Diabetes Brother    Kidney disease Neg Hx     Social History:  Social History   Substance and Sexual Activity  Alcohol Use Not Currently     Social History   Substance and Sexual Activity  Drug Use No    Social History   Socioeconomic History   Marital status: Married    Spouse name: [name]   Number of children: 2   Years of education: Not on file   Highest education level: Not on file  Occupational History   Not on file  Tobacco Use   Smoking status: Never   Smokeless tobacco: Never  Vaping Use   Vaping Use: Never used  Substance and Sexual Activity   Alcohol use: Not Currently   Drug use: No   Sexual activity: Yes    Birth control/protection: None  Other Topics Concern   Not on file  Social History Narrative   Live at home with wife.    Social Determinants of Health   Financial Resource Strain: Not on file  Food Insecurity: Not on file  Transportation Needs: Not on file  Physical Activity: Not on file  Stress: Not on file  Social Connections: Not on file   Additional Social History:                         Sleep: Good  Appetite:  Good  Current Medications: Current Facility-Administered Medications  Medication Dose Route Frequency Provider Last Rate Last Admin    acetaminophen (TYLENOL) tablet 325-650 mg  325-650 mg Oral Q6H PRN Clapacs, [name], MD   650 mg at 04/21/22 0844   alum & mag hydroxide-simeth (MAALOX/MYLANTA) 200-200-20 MG/5ML suspension 30 mL  30 mL Oral Q4H PRN Clapacs, [name], MD       ARIPiprazole (ABILIFY) tablet 4 mg  4 mg Oral QPC breakfast Parks Ranger, DO   4 mg at 04/25/22 2297   aspirin EC tablet 81 mg  81 mg Oral Daily Clapacs, [name], MD   81 mg at 04/25/22 9892   bisacodyl (DULCOLAX) suppository 10 mg  10 mg Rectal Daily PRN Clapacs, [name], MD       buPROPion (WELLBUTRIN XL) 24 hr tablet 300 mg  300 mg Oral Daily Clapacs, John T, MD   300 mg at 04/25/22 1194   docusate sodium (COLACE) capsule 100 mg  100 mg Oral BID Clapacs, John T, MD   100 mg at 04/25/22 0837   fentaNYL (SUBLIMAZE) injection 25 mcg  25 mcg Intravenous Q5 min PRN [place] Service, Jane Canary, MD       ferrous sulfate tablet 325 mg  325 mg Oral Q breakfast Clapacs, [name], MD   325 mg at 04/25/22 1740   HYDROcodone-acetaminophen (NORCO/VICODIN) 5-325 MG per tablet 1-2 tablet  1-2 tablet Oral Q4H PRN Clapacs, [name], MD   1 tablet at 04/24/22 1715   insulin glargine-yfgn (SEMGLEE) injection 5 Units  5 Units Subcutaneous BID Clapacs, [name], MD   5 Units at 04/25/22 8144   menthol-cetylpyridinium (CEPACOL) lozenge 3 mg  1 lozenge Oral PRN Clapacs, [name], MD       Or   phenol (CHLORASEPTIC) mouth spray 1 spray  1 spray Mouth/Throat PRN Clapacs, [name], MD       methocarbamol (ROBAXIN) tablet 500 mg  500 mg Oral Q6H PRN Clapacs, [name], MD       Or   methocarbamol (ROBAXIN) 500 mg in dextrose 5 % 50 mL IVPB  500 mg Intravenous Q6H PRN Clapacs, John T, MD       metoprolol tartrate (LOPRESSOR) tablet 25 mg  25 mg Oral BID Clapacs, [name], MD   25 mg at 04/25/22 0838   midazolam (VERSED) injection 2 mg  2 mg Intravenous Once Clapacs, John T, MD       midazolam (VERSED) injection 2 mg  2 mg Intravenous Once Clapacs, John T, MD       ondansetron (ZOFRAN)  injection 4 mg  4 mg  Intravenous Once PRN [place] Service, Jane Canary, MD       polyethylene glycol (MIRALAX / GLYCOLAX) packet 17 g  17 g Oral Daily Clapacs, [name], MD   17 g at 04/25/22 8676   prazosin (MINIPRESS) capsule 1 mg  1 mg Oral QHS Clapacs, John T, MD   1 mg at 04/24/22 2113   senna (SENOKOT) tablet 8.6 mg  1 tablet Oral BID Clapacs, John T, MD   8.6 mg at 04/25/22 0853   sodium phosphate (FLEET) 7-19 GM/118ML enema 1 enema  1 enema Rectal Once PRN Clapacs, [name], MD       tamsulosin (FLOMAX) capsule 0.4 mg  0.4 mg Oral QPC supper Clapacs, John T, MD   0.4 mg at 04/24/22 1715   traZODone (DESYREL) tablet 50 mg  50 mg Oral QHS PRN Clapacs, [name], MD   50 mg at 04/21/22 2132   zolpidem (AMBIEN) tablet 5 mg  5 mg Oral QHS PRN Clapacs, [name], MD        Lab Results:  Results for orders placed or performed during the hospital encounter of 04/11/22 (from the past 48 hour(s))  Glucose, capillary     Status: Abnormal   Collection Time: 04/23/22  9:11 PM  Result Value Ref Range   Glucose-Capillary 159 (H) 70 - 99 mg/dL    Comment: Glucose reference range applies only to samples taken after fasting for at least 8 hours.  Glucose, capillary     Status: Abnormal   Collection Time: 04/24/22  7:49 AM  Result Value Ref Range   Glucose-Capillary 111 (H) 70 - 99 mg/dL    Comment: Glucose reference range applies only to samples taken after fasting for at least 8 hours.  Glucose, capillary     Status: Abnormal   Collection Time: 04/24/22  2:13 PM  Result Value Ref Range   Glucose-Capillary 123 (H) 70 - 99 mg/dL    Comment: Glucose reference range applies only to samples taken after fasting for at least 8 hours.  Glucose, capillary     Status: Abnormal   Collection Time: 04/24/22  8:37 PM  Result Value Ref Range   Glucose-Capillary 131 (H) 70 - 99 mg/dL    Comment: Glucose reference range applies only to samples taken after fasting for at least 8 hours.  Glucose, capillary     Status:  Abnormal   Collection Time: 04/25/22  8:50 AM  Result Value Ref Range   Glucose-Capillary 346 (H) 70 - 99 mg/dL    Comment: Glucose reference range applies only to samples taken after fasting for at least 8 hours.    Blood Alcohol level:  Lab Results  Component Value Date   ETH <10 19/50/9326    Metabolic Disorder Labs: Lab Results  Component Value Date   HGBA1C 5.6 03/19/2022   MPG 114.02 03/19/2022   No results found for: "PROLACTIN" Lab Results  Component Value Date   CHOL 131 03/19/2022   TRIG 92 03/19/2022   HDL 33 (L) 03/19/2022   CHOLHDL 4.0 03/19/2022   VLDL 18 03/19/2022   LDLCALC 80 03/19/2022    Physical Findings: AIMS: Facial and Oral Movements Muscles of Facial Expression: None, normal Lips and Perioral Area: None, normal Jaw: None, normal Tongue: None, normal,Extremity Movements Upper (arms, wrists, hands, fingers): None, normal Lower (legs, knees, ankles, toes): Moderate (Rt. leg & hip pain. S/P Rt. hip surgery), Trunk Movements Neck, shoulders, hips: None, normal, Overall Severity Severity of abnormal movements (highest  score from questions above): Moderate (R/T to Rt. hip surgery) Incapacitation due to abnormal movements: None, normal Patient's awareness of abnormal movements (rate only patient's report): No Awareness, Dental Status Current problems with teeth and/or dentures?: No Does patient usually wear dentures?: No  CIWA:    COWS:     Musculoskeletal: Strength & Muscle Tone: within normal limits Gait & Station: unsteady Patient leans: N/A  Psychiatric Specialty Exam:  Presentation  General Appearance: Appropriate for Environment  Eye Contact:Good  Speech:Clear and Coherent  Speech Volume:Normal  Handedness:Right   Mood and Affect  Mood:Anxious (stated "a little anxious")  Affect:Congruent   Thought Process  Thought Processes:Linear; Coherent; Goal Directed  Descriptions of Associations:Intact  Orientation:Full (Time,  Place and Person)  Thought Content:Logical; WDL  History of Schizophrenia/Schizoaffective disorder:No  Duration of Psychotic Symptoms:No data recorded Hallucinations:No data recorded Ideas of Reference:None  Suicidal Thoughts:No data recorded Homicidal Thoughts:No data recorded  Sensorium  Memory:Immediate Good  Judgment:Good  Insight:Good   Executive Functions  Concentration:Good  Attention Span:Good  Recall:Good  Fund of Knowledge:Good  Language:Good   Psychomotor Activity  Psychomotor Activity:No data recorded  Assets  Assets:Housing; Catering manager; Desire for Improvement; Communication Skills; Resilience; Social Support   Sleep  Sleep:No data recorded   Physical Exam: Physical Exam Vitals and nursing note reviewed.  Constitutional:      Appearance: Normal appearance. He is normal weight.  Neurological:     General: No focal deficit present.     Mental Status: He is alert and oriented to person, place, and time.  Psychiatric:        Attention and Perception: Attention and perception normal.        Mood and Affect: Mood and affect normal.        Speech: Speech normal.        Behavior: Behavior normal. Behavior is cooperative.        Thought Content: Thought content normal.        Cognition and Memory: Cognition normal. He exhibits impaired recent memory.        Judgment: Judgment normal.    Review of Systems  Constitutional: Negative.   HENT: Negative.    Eyes: Negative.   Respiratory: Negative.    Cardiovascular: Negative.   Gastrointestinal: Negative.   Genitourinary: Negative.   Musculoskeletal: Negative.   Skin: Negative.   Neurological: Negative.   Endo/Heme/Allergies: Negative.   Psychiatric/Behavioral:  Positive for depression. The patient is nervous/anxious.    Blood pressure 116/83, pulse 63, temperature 97.8 F (36.6 C), resp. rate 18, height '6\' 2"'$  (1.88 m), weight 87.8 kg, SpO2 98 %. Body mass index is 24.84  kg/m.   Treatment Plan Summary: Daily contact with patient to assess and evaluate symptoms and progress in treatment, Medication management, and Plan increase Abilify to 5 mg/day.  Consult home health for wound care and physical therapy.  Social work to discuss discharge plans with his wife.  [place] Ranger, DO 04/25/2022, 12:51 PM

## 2022-04-25 NOTE — BHH Counselor (Signed)
CSW spoke with pt regarding discharge. CSW informed him that he would be discharged on Friday and she stated that he was open to discharging tomorrow.   He stated he does not need follow up with OT per conversation with OT staff.   He stated that he would follow up with his therapist and contact her regarding a follow up appoint.   Psychiatry referral and appointment established.   Jennica Tagliaferri [place], MSW, LCSW-A 6/29/20234:17 PM

## 2022-04-25 NOTE — Progress Notes (Signed)
Patient is alert and oriented times 4. Mood and affect anxious and sullen. He denies SI, HI, and AVH. Patient expresses feelings of anxiety and depression 4/10 at this time. States he slept good last night. Morning meds given whole by mouth W/O difficulty. Ate breakfast in day room- appetite good. Patient remains on unit with Q15 minute checks in place.

## 2022-04-25 NOTE — Plan of Care (Signed)
Problem: Education: Goal: Knowledge of General Education information will improve Description: Including pain rating scale, medication(s)/side effects and non-pharmacologic comfort measures 04/25/2022 1324 by Nolon Bussing, RN Outcome: Progressing 04/25/2022 1323 by Nolon Bussing, RN Outcome: Progressing   Problem: Health Behavior/Discharge Planning: Goal: Ability to manage health-related needs will improve 04/25/2022 1324 by Nolon Bussing, RN Outcome: Progressing 04/25/2022 1323 by Nolon Bussing, RN Outcome: Progressing   Problem: Clinical Measurements: Goal: Ability to maintain clinical measurements within normal limits will improve 04/25/2022 1324 by Nolon Bussing, RN Outcome: Progressing 04/25/2022 1323 by Nolon Bussing, RN Outcome: Progressing Goal: Will remain free from infection 04/25/2022 1324 by Nolon Bussing, RN Outcome: Progressing 04/25/2022 1323 by Nolon Bussing, RN Outcome: Progressing Goal: Diagnostic test results will improve 04/25/2022 1324 by Nolon Bussing, RN Outcome: Progressing 04/25/2022 1323 by Nolon Bussing, RN Outcome: Progressing Goal: Respiratory complications will improve 04/25/2022 1324 by Nolon Bussing, RN Outcome: Progressing 04/25/2022 1323 by Nolon Bussing, RN Outcome: Progressing Goal: Cardiovascular complication will be avoided 04/25/2022 1324 by Nolon Bussing, RN Outcome: Progressing 04/25/2022 1323 by Nolon Bussing, RN Outcome: Progressing   Problem: Activity: Goal: Risk for activity intolerance will decrease 04/25/2022 1324 by Nolon Bussing, RN Outcome: Progressing 04/25/2022 1323 by Nolon Bussing, RN Outcome: Progressing   Problem: Nutrition: Goal: Adequate nutrition will be maintained 04/25/2022 1324 by Nolon Bussing, RN Outcome: Progressing 04/25/2022 1323 by Nolon Bussing, RN Outcome: Progressing   Problem: Coping: Goal: Level of anxiety will  decrease 04/25/2022 1324 by Nolon Bussing, RN Outcome: Progressing 04/25/2022 1323 by Nolon Bussing, RN Outcome: Progressing   Problem: Elimination: Goal: Will not experience complications related to bowel motility 04/25/2022 1324 by Nolon Bussing, RN Outcome: Progressing 04/25/2022 1323 by Nolon Bussing, RN Outcome: Progressing Goal: Will not experience complications related to urinary retention 04/25/2022 1324 by Nolon Bussing, RN Outcome: Progressing 04/25/2022 1323 by Nolon Bussing, RN Outcome: Progressing   Problem: Pain Managment: Goal: General experience of comfort will improve 04/25/2022 1324 by Nolon Bussing, RN Outcome: Progressing 04/25/2022 1323 by Nolon Bussing, RN Outcome: Progressing   Problem: Safety: Goal: Ability to remain free from injury will improve 04/25/2022 1324 by Nolon Bussing, RN Outcome: Progressing 04/25/2022 1323 by Nolon Bussing, RN Outcome: Progressing   Problem: Skin Integrity: Goal: Risk for impaired skin integrity will decrease 04/25/2022 1324 by Nolon Bussing, RN Outcome: Progressing 04/25/2022 1323 by Nolon Bussing, RN Outcome: Progressing   Problem: Education: Goal: Knowledge of [place] Education information/materials will improve 04/25/2022 1324 by Nolon Bussing, RN Outcome: Progressing 04/25/2022 1323 by Nolon Bussing, RN Outcome: Progressing Goal: Emotional status will improve 04/25/2022 1324 by Nolon Bussing, RN Outcome: Progressing 04/25/2022 1323 by Nolon Bussing, RN Outcome: Progressing Goal: Mental status will improve 04/25/2022 1324 by Nolon Bussing, RN Outcome: Progressing 04/25/2022 1323 by Nolon Bussing, RN Outcome: Progressing Goal: Verbalization of understanding the information provided will improve 04/25/2022 1324 by Nolon Bussing, RN Outcome: Progressing 04/25/2022 1323 by Nolon Bussing, RN Outcome: Progressing    Problem: Activity: Goal: Interest or engagement in activities will improve 04/25/2022 1324 by Nolon Bussing, RN Outcome: Progressing 04/25/2022 1323 by Nolon Bussing, RN Outcome: Progressing Goal: Sleeping patterns will improve 04/25/2022 1324 by Nolon Bussing, RN Outcome: Progressing 04/25/2022 1323 by Nolon Bussing, RN Outcome: Progressing   Problem: Coping: Goal:  Ability to verbalize frustrations and anger appropriately will improve 04/25/2022 1324 by Nolon Bussing, RN Outcome: Progressing 04/25/2022 1323 by Nolon Bussing, RN Outcome: Progressing Goal: Ability to demonstrate self-control will improve 04/25/2022 1324 by Nolon Bussing, RN Outcome: Progressing 04/25/2022 1323 by Nolon Bussing, RN Outcome: Progressing   Problem: Health Behavior/Discharge Planning: Goal: Identification of resources available to assist in meeting health care needs will improve 04/25/2022 1324 by Nolon Bussing, RN Outcome: Progressing 04/25/2022 1323 by Nolon Bussing, RN Outcome: Progressing Goal: Compliance with treatment plan for underlying cause of condition will improve 04/25/2022 1324 by Nolon Bussing, RN Outcome: Progressing 04/25/2022 1323 by Nolon Bussing, RN Outcome: Progressing   Problem: Physical Regulation: Goal: Ability to maintain clinical measurements within normal limits will improve 04/25/2022 1324 by Nolon Bussing, RN Outcome: Progressing 04/25/2022 1323 by Nolon Bussing, RN Outcome: Progressing   Problem: Health Behavior/Discharge Planning: Goal: Ability to make decisions will improve 04/25/2022 1324 by Nolon Bussing, RN Outcome: Progressing 04/25/2022 1323 by Nolon Bussing, RN Outcome: Progressing Goal: Compliance with therapeutic regimen will improve 04/25/2022 1324 by Nolon Bussing, RN Outcome: Progressing 04/25/2022 1323 by Nolon Bussing, RN Outcome: Progressing   Problem: Safety: Goal:  Ability to disclose and discuss suicidal ideas will improve 04/25/2022 1324 by Nolon Bussing, RN Outcome: Progressing 04/25/2022 1323 by Nolon Bussing, RN Outcome: Progressing Goal: Ability to identify and utilize support systems that promote safety will improve 04/25/2022 1324 by Nolon Bussing, RN Outcome: Progressing 04/25/2022 1323 by Nolon Bussing, RN Outcome: Progressing   Problem: Self-Concept: Goal: Will verbalize positive feelings about self 04/25/2022 1324 by Nolon Bussing, RN Outcome: Progressing 04/25/2022 1323 by Nolon Bussing, RN Outcome: Progressing Goal: Level of anxiety will decrease 04/25/2022 1324 by Nolon Bussing, RN Outcome: Progressing 04/25/2022 1323 by Nolon Bussing, RN Outcome: Progressing

## 2022-04-25 NOTE — Consult Note (Signed)
ECT: Patient seen for follow-up.  Sitting in his room.  Awake.  Engaged in conversation.  Good eye contact.  Reactivity to affect is improved.  Mood is better.  Mostly still focused on his anxiety.  Patient is alert and oriented no sign of delirium.  Patient has shown improvement with ECT.  We discussed the options of either performing a last treatment in this series tomorrow or giving him a longer time to recover before going home.  Patient feels he is stable and would prefer to not have ECT tomorrow so we will not add him to the schedule at this time.

## 2022-04-25 NOTE — Progress Notes (Signed)
Surgical incision to right hip cleaned with NS, pat dry, covered with Aquacel, and a Tegaderm. Surgical incision has no odor, serosanguineous drainage noted.

## 2022-04-26 ENCOUNTER — Telehealth: Payer: Self-pay

## 2022-04-26 ENCOUNTER — Ambulatory Visit: Payer: Medicare Other

## 2022-04-26 LAB — GLUCOSE, CAPILLARY: Glucose-Capillary: 106 mg/dL — ABNORMAL HIGH (ref 70–99)

## 2022-04-26 MED ORDER — ARIPIPRAZOLE 5 MG PO TABS: 5.0000 mg | ORAL_TABLET | Freq: Every day | ORAL | 3 refills | Status: DC

## 2022-04-26 MED ORDER — SENNA 8.6 MG PO TABS: 1.0000 | ORAL_TABLET | Freq: Two times a day (BID) | ORAL | 0 refills | Status: DC

## 2022-04-26 MED ORDER — HYDROCODONE-ACETAMINOPHEN 5-325 MG PO TABS
1.0000 | ORAL_TABLET | ORAL | 0 refills | Status: DC | PRN
Start: 2022-04-26 — End: 2022-05-20

## 2022-04-26 MED ORDER — PRAZOSIN HCL 1 MG PO CAPS: 1.0000 mg | ORAL_CAPSULE | Freq: Every day | ORAL | 3 refills | Status: DC

## 2022-04-26 MED ORDER — METHOCARBAMOL 500 MG PO TABS
500.0000 mg | ORAL_TABLET | Freq: Four times a day (QID) | ORAL | 0 refills | Status: DC | PRN
Start: 2022-04-26 — End: 2022-07-31

## 2022-04-26 MED ORDER — TRAZODONE HCL 50 MG PO TABS: 50.0000 mg | ORAL_TABLET | Freq: Every evening | ORAL | 3 refills | Status: DC | PRN

## 2022-04-26 MED ORDER — METOPROLOL TARTRATE 25 MG PO TABS
25.0000 mg | ORAL_TABLET | Freq: Two times a day (BID) | ORAL | 3 refills | Status: DC
Start: 2022-04-26 — End: 2022-07-31

## 2022-04-26 MED ORDER — BUPROPION HCL ER (XL) 300 MG PO TB24: 300.0000 mg | ORAL_TABLET | Freq: Every day | ORAL | 3 refills | Status: DC

## 2022-04-26 MED ORDER — TAMSULOSIN HCL 0.4 MG PO CAPS
0.4000 mg | ORAL_CAPSULE | Freq: Every day | ORAL | 3 refills | Status: DC
Start: 2022-04-26 — End: 2022-07-31

## 2022-04-26 NOTE — Progress Notes (Signed)
  [facility] Adult Case Management Discharge Plan :  Will you be returning to the same living situation after discharge:  Yes,  pt reports that he is returning home. At discharge, do you have transportation home?: Yes,  pt reports that he his wife will provide transportation. Do you have the ability to pay for your medications: Yes,  Medicare Part A and B  Release of information consent forms completed and in the chart;  Patient's signature needed at discharge.  Patient to Follow up at:  Follow-up Information     [facility] Follow up on 05/20/2022.   Specialty: Behavioral Health Why: You have an appointment scheduled for Monday July 24th at 1pm. Please arrive at 12:45pm to complete paperwork. ( in person). Contact information: [place] [phone]                Next level of care provider has access to [facility] and Suicide Prevention discussed: Yes,  SPE completed with the patient, attempts were made with patient's wife.      Has patient been referred to the Quitline?: N/A patient is not a smoker  Patient has been referred for addiction treatment: [place], LCSW 04/26/2022, 11:24 AM

## 2022-04-26 NOTE — Group Note (Signed)
[facility] LCSW Group Therapy Note   Group Date: 04/26/2022 Start Time: 1300 End Time: 1400  Type of Therapy and Topic:  Group Therapy:  Feelings around Relapse and Recovery  Participation Level:  Did Not Attend   Mood:  Description of Group:    Patients in this group will discuss emotions they experience before and after a relapse. They will process how experiencing these feelings, or avoidance of experiencing them, relates to having a relapse. Facilitator will guide patients to explore emotions they have related to recovery. Patients will be encouraged to process which emotions are more powerful. They will be guided to discuss the emotional reaction significant others in their lives may have to patients' relapse or recovery. Patients will be assisted in exploring ways to respond to the emotions of others without this contributing to a relapse.  Therapeutic Goals: Patient will identify two or more emotions that lead to relapse for them:  Patient will identify two emotions that result when they relapse:  Patient will identify two emotions related to recovery:  Patient will demonstrate ability to communicate their needs through discussion and/or role plays.   Summary of Patient Progress:  Group not held due to acuity of the unit.    Therapeutic Modalities:   Cognitive Behavioral Therapy Solution-Focused Therapy Assertiveness Training Relapse Prevention Therapy   Rozann Lesches, LCSW

## 2022-04-26 NOTE — BHH Counselor (Signed)
CSW spoke with the patient's wife.  Wife was surprised at patient's discharge.  Wife reports that she has not obtained any of the equipment she previously told CSW team that she would rent.  She asked for recommendations on bedside commode and CSW informed that she can do what she wishes with that. Wife requested that home health referrals be complete.  CSW reported that she will have to review notes to check on recommendations and return call to wife.  Wife advised at this time to not provide transportation for the patient until all equipment delivered and referral for home health complete; CSW will call once complete.  CSW reviewed PT and OT notes.  OT notes has no recommendations.  PT note had recommendations for walker and home health PT.  Patient okay with recommendations and asked CSW for assistance in completing.   CSW reached out to [facility] for [facility] and walker.   Referral for home health complete with [employer].  Gilford Rile has been delivered.    CSW has called the wife several times with no luck.    Assunta Curtis, MSW, LCSW 04/26/2022 3:15 PM

## 2022-04-26 NOTE — BHH Suicide Risk Assessment (Signed)
[facility] Discharge Suicide Risk Assessment   Principal Problem: Severe recurrent major depression without psychotic features ([place]) Discharge Diagnoses: Principal Problem:   Severe recurrent major depression without psychotic features ([place]) Active Problems:   Paroxysmal atrial fibrillation (HCC)   Intertrochanteric fracture of right hip (HCC)   Total Time spent with patient: 1 hour  Musculoskeletal: Strength & Muscle Tone: within normal limits Gait & Station: unsteady Patient leans: N/A  Psychiatric Specialty Exam  Presentation  General Appearance: Appropriate for Environment  Eye Contact:Good  Speech:Clear and Coherent  Speech Volume:Normal  Handedness:Right   Mood and Affect  Mood:Anxious (stated "a little anxious")  Duration of Depression Symptoms: Greater than two weeks  Affect:Congruent   Thought Process  Thought Processes:Linear; Coherent; Goal Directed  Descriptions of Associations:Intact  Orientation:Full (Time, Place and Person)  Thought Content:Logical; WDL  History of Schizophrenia/Schizoaffective disorder:No  Duration of Psychotic Symptoms:No data recorded Hallucinations:No data recorded Ideas of Reference:None  Suicidal Thoughts:No data recorded Homicidal Thoughts:No data recorded  Sensorium  Memory:Immediate Good  Judgment:Good  Insight:Good   Executive Functions  Concentration:Good  Attention Span:Good  [place] of Knowledge:Good  Language:Good   Psychomotor Activity  Psychomotor Activity:No data recorded  Assets  Assets:Housing; Catering manager; Desire for Improvement; Communication Skills; Resilience; Social Support   Sleep  Sleep:No data recorded   Blood pressure 115/80, pulse 64, temperature (!) 97.5 F (36.4 C), resp. rate 18, height '6\' 2"'$  (1.88 m), weight 87.8 kg, SpO2 99 %. Body mass index is 24.84 kg/m.  Mental Status Per Nursing Assessment::   On Admission:     Demographic Factors:   Male and Caucasian  Loss Factors: Decline in physical health  Historical Factors: NA  Risk Reduction Factors:   Sense of responsibility to family, Living with another person, especially a relative, Positive social support, Positive therapeutic relationship, and Positive coping skills or problem solving skills  Continued Clinical Symptoms:  Depression:   Anhedonia  Cognitive Features That Contribute To Risk:  None    Suicide Risk:  Minimal: No identifiable suicidal ideation.  Patients presenting with no risk factors but with morbid ruminations; may be classified as minimal risk based on the severity of the depressive symptoms   Follow-up [place] Follow up on 05/20/2022.   Specialty: Behavioral Health Why: You have an appointment scheduled for Monday July 24th at 1pm. Please arrive at 12:45pm to complete paperwork. ( in person). Contact information: [place] [address] [place] [phone]                Plan Of Care/Follow-up recommendations: [place], DO 04/26/2022, 11:15 AM

## 2022-04-26 NOTE — Care Management Important Message (Signed)
Important Message  Patient Details  Name: John [name] MRN: [id number] Date of Birth: [birth year]   Medicare Important Message Given:  Yes     Rozann Lesches, LCSW 04/26/2022, 11:32 AM

## 2022-04-26 NOTE — Discharge Summary (Signed)
Physician Discharge Summary Note  Patient:  John [name] is an 81 y.o., male MRN:  [id number] DOB:  [birth year] Patient phone:  [phone] (home)  Patient address:   [place] [address],  Total Time spent with patient: 1 hour  Date of Admission:  04/11/2022 Date of Discharge: 04/26/2022  Reason for Admission:  John [name] is a 81 year old white male with a history of depression and anxiety. John [name] is admitted voluntarily under routine orders and precautions. He has been treated for depression since he was a teenager.  He has been on Elavil and Prozac in the past.  Recently, he has been on Effexor for the past 15 years.  He had surgery about 3 months ago for hernia and after anesthesia his eyes were very irritated and this seemed to have upset him and he never recovered from his anxiety and depression after the surgery.  He endorses anhedonia, difficulty sleeping, depressed mood, extreme anxiety and hopelessness.  He denies suicidal ideation.  He has had 1 suicide attempt in the past by suffocation.  He has seen a psychiatrist off and on for 4 most of his life.  He states that he sees mostly therapist.  He is originally from [place] but then moved to [place] to be closer to his daughter then eventually moved to [place].  He also has a son who lives in [place].  He is married and states that his relationship with his family are good.  He also endorses racing thoughts.   PER INITIAL INTAKE: Patient has long history of anxiety and depression, which he identifies as stemming from a head injury at 81 years of age.  He has been functioning very well throughout his life.  He is a retired Chief Financial Officer.  He has been on a low-dose of venlafaxine for many years.  He has had a few hospitalizations, the last being in the 1990s.  The only suicide attempt that he describes is putting a bag over his head many years ago, but then he took it off.  He sees his primary care physician for  medication management and has had a therapist for many years and knew "spiritual director" for the last year and a half, with whom he has a good relationship.   Patient presented voluntarily from home after worsening depression and anxiety.  Patient states that he had hernia surgery about 2 months ago and then started developing problems with his eyes.  He has seen his ophthalmologist and that problem has cleared up, but patient continues to have a lot of anxiety and depression, many times feeling overwhelmed and that life is not worth living.  He identifies his wife, family, and close friends as of great help to him.  He feels better after he talks at them, but that only lasts for a short time.  Patient does feel that he is often occupied with physical symptoms and worries a lot about that.  Patient says he came to the hospital because he believes that he needs some kind of adjustment in medication.  He has started thinking about looking for ways that he could kill himself. Patient is very pleasant, articulate.  He speaks in linear sentences.  He has forward thinking thoughts.  Describes poor sleep and fair appetite.  Principal Problem: Severe recurrent major depression without psychotic features [facility]) Discharge Diagnoses: Principal Problem:   Severe recurrent major depression without psychotic features [facility]) Active Problems:   Paroxysmal atrial fibrillation (HCC)   Intertrochanteric fracture of right hip (  [place])   Past Psychiatric History: Recurrent major depressive disorder with 1 past psychiatric admission.  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 06/2015 CT Head: abnl thickening of mid-dist R cervical ICA w/o narrowing - ? vasculitis and thrombosed/healing dissection; b. 01/2021 Carotid U/S: 1-39% bilat ICA stenoses.   Eczema     Elevated PSA    Epidural hematoma (HCC)    a. Age 22 - struck in head w/ baseball --> s/p craniotomy.   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 Approx 2015 - eval in [place] - conservatively managed.    Past Surgical History:  Procedure Laterality Date   ANKLE ARTHROSCOPY Left    fracture   CHOLECYSTECTOMY  2007   [place]   left frontal craniotomy for epidural hematoma   HIP ARTHROPLASTY Right 04/07/2022   Procedure: ARTHROPLASTY BIPOLAR HIP (HEMIARTHROPLASTY);  Surgeon: Earnestine Leys, MD;  Location: [facility] ORS;  Service: Orthopedics;  Laterality: Right;   INSERTION OF MESH  01/02/2022   Procedure: INSERTION OF MESH;  Surgeon: Herbert Pun, MD;  Location: [facility] ORS;  Service: General;;   LIGAMENT REPAIR Right    thumb   RETINAL DETACHMENT SURGERY     TREATMENT FISTULA ANAL  2006   Family History:  Family History  Problem Relation Age of Onset   Osteoporosis Mother    Depression Mother    Prostate cancer Father    Hypertension Father    Diabetes Brother    Kidney disease Neg Hx    Family Psychiatric  History: Unremarkable Social History:  Social History   Substance and Sexual Activity  Alcohol Use Not Currently     Social History   Substance and Sexual Activity  Drug Use No    Social History   Socioeconomic History   Marital status: Married    Spouse name: [name]   Number of children: 2   Years of education: Not on file   Highest education level: Not on file  Occupational History   Not on file  Tobacco Use   Smoking status: Never   Smokeless tobacco: Never  Vaping Use   Vaping Use: Never used  Substance and Sexual Activity    Alcohol use: Not Currently   Drug use: No   Sexual activity: Yes    Birth control/protection: None  Other Topics Concern   Not on file  Social History Narrative   Live at home with wife.    Social Determinants of Health   Financial Resource Strain: Not on file  Food Insecurity: Not on file  Transportation Needs: Not on file  Physical Activity: Not on file  Stress: Not on file  Social Connections: Not on file    Hospital Course: John [name] was admitted under routine orders and precautions to the geriatric psychiatry unit for worsening depression after he had a hernia repair.  He states that he has been struggling with depression off and on for the past 20 years.  While in the inpatient unit he was tried on numerous medications including Risperdal, Effexor XR, Remeron and Zyprexa.  He had extreme anxiety and was initiated on Ativan but became unsteady with it so it was discontinued.  Due to his getting up at night and going to the bathroom all of his sleep medications were discontinued.  That did not matter because he eventually did fall on his own and broke his hip.  He had arthroplasty surgery with success.  His staples were recently removed and he has been doing physical therapy and doing well.  He also received ECT for his depression and his mood has also improved.  He eventually ended up on Wellbutrin XL 300 mg and Abilify 5 mg and did well with this.  His mood and affect improved.  He tolerated medications and denies any side effects.  He continues to work with physical therapy.  He is sleeping well without medications.  He seems to be doing pretty well on Abilify and Wellbutrin.  While on the unit he was pleasant and cooperative and interacted well with staff and peers.  He was very anxious about going home and I reassured him that it was time.  It was felt he maximized hospitalization and he was discharged home.  On the day of discharge, he denied suicidal ideation, homicidal ideation, auditory  or visual hallucinations.  His judgment and insight were good.  Physical Findings: AIMS: Facial and Oral Movements Muscles of Facial Expression: None, normal Lips and Perioral Area: None, normal Jaw: None, normal Tongue: None, normal,Extremity Movements Upper (arms, wrists, hands, fingers): None, normal Lower (legs, knees, ankles, toes): Moderate (Rt. leg & hip pain. S/P Rt. hip surgery), Trunk Movements Neck, shoulders, hips: None, normal, Overall Severity Severity of abnormal movements (highest score from questions above): Moderate (R/T to Rt. hip surgery) Incapacitation due to abnormal movements: None, normal Patient's awareness of abnormal movements (rate only patient's report): No Awareness, Dental Status Current problems with teeth and/or dentures?: No Does patient usually wear dentures?: No  CIWA:    COWS:     Musculoskeletal: Strength & Muscle Tone: within normal limits Gait & Station: unsteady Patient leans: N/A   Psychiatric Specialty Exam:  Presentation  General Appearance: Appropriate for Environment  Eye Contact:Good  Speech:Clear and Coherent  Speech Volume:Normal  Handedness:Right   Mood and Affect  Mood:Anxious (stated "a little anxious")  Affect:Congruent   Thought Process  Thought Processes:Linear; Coherent; Goal Directed  Descriptions of Associations:Intact  Orientation:Full (Time, Place and Person)  Thought Content:Logical; WDL  History of Schizophrenia/Schizoaffective disorder:No  Duration of Psychotic Symptoms:No data recorded Hallucinations:No data recorded Ideas of Reference:None  Suicidal Thoughts:No data recorded Homicidal Thoughts:No data recorded  Sensorium  Memory:Immediate Good  Judgment:Good  Insight:Good   Executive Functions  Concentration:Good  Attention Span:Good  Recall:Good  Fund of Knowledge:Good  Language:Good   Psychomotor Activity  Psychomotor Activity:No data recorded  Assets   Assets:Housing; Catering manager; Desire for Improvement; Communication Skills; Resilience; Social Support   Sleep  Sleep:No data recorded   Physical Exam: Physical Exam ROS Blood pressure 115/80, pulse 64, temperature (!) 97.5 F (36.4 C), resp. rate 18, height '6\' 2"'$  (1.88 m), weight 87.8 kg, SpO2 99 %. Body mass index is 24.84 kg/m.   Social History   Tobacco Use  Smoking Status Never  Smokeless Tobacco Never   Tobacco Cessation:  N/A, patient does not currently  use tobacco products   Blood Alcohol level:  Lab Results  Component Value Date   ETH <10 32/20/2542    Metabolic Disorder Labs:  Lab Results  Component Value Date   HGBA1C 5.6 03/19/2022   MPG 114.02 03/19/2022   No results found for: "PROLACTIN" Lab Results  Component Value Date   CHOL 131 03/19/2022   TRIG 92 03/19/2022   HDL 33 (L) 03/19/2022   CHOLHDL 4.0 03/19/2022   VLDL 18 03/19/2022   Sebastian 80 03/19/2022    See Psychiatric Specialty Exam and Suicide Risk Assessment completed by Attending Physician prior to discharge.  Discharge destination:  Home  Is patient on multiple antipsychotic therapies at discharge:  No   Has Patient had three or more failed trials of antipsychotic monotherapy by history:  No  Recommended Plan for Multiple Antipsychotic Therapies: NA   Allergies as of 04/26/2022   No Known Allergies      Medication List     STOP taking these medications    alum & mag hydroxide-simeth 200-200-20 MG/5ML suspension Commonly known as: MAALOX/MYLANTA   insulin aspart 100 UNIT/ML injection Commonly known as: novoLOG   insulin glargine-yfgn 100 UNIT/ML injection Commonly known as: SEMGLEE   LORazepam 1 MG tablet Commonly known as: ATIVAN       TAKE these medications      Indication  ARIPiprazole 5 MG tablet Commonly known as: ABILIFY Take 1 tablet (5 mg total) by mouth daily after breakfast. Start taking on: April 27, 2022  Indication: Major  Depressive Disorder   aspirin EC 81 MG tablet Take 81 mg by mouth daily.    buPROPion 300 MG 24 hr tablet Commonly known as: WELLBUTRIN XL Take 1 tablet (300 mg total) by mouth daily. Start taking on: April 27, 2022 What changed:  medication strength how much to take  Indication: Major Depressive Disorder   docusate sodium 100 MG capsule Commonly known as: COLACE Take 1 capsule (100 mg total) by mouth 2 (two) times daily.    ferrous sulfate 325 (65 FE) MG tablet Take 1 tablet (325 mg total) by mouth daily with breakfast.    HYDROcodone-acetaminophen 5-325 MG tablet Commonly known as: NORCO/VICODIN Take 1-2 tablets by mouth every 4 (four) hours as needed for moderate pain (pain score 4-6).    methocarbamol 500 MG tablet Commonly known as: ROBAXIN Take 1 tablet (500 mg total) by mouth every 6 (six) hours as needed for muscle spasms. What changed: Another medication with the same name was added. Make sure you understand how and when to take each.    methocarbamol 500 MG tablet Commonly known as: ROBAXIN Take 1 tablet (500 mg total) by mouth every 6 (six) hours as needed for muscle spasms. What changed: You were already taking a medication with the same name, and this prescription was added. Make sure you understand how and when to take each.  Indication: Musculoskeletal Pain   metoprolol tartrate 25 MG tablet Commonly known as: LOPRESSOR Take 1 tablet (25 mg total) by mouth 2 (two) times daily.  Indication: High Blood Pressure Disorder   polyethylene glycol 17 g packet Commonly known as: MIRALAX / GLYCOLAX Take 17 g by mouth daily as needed.    prazosin 1 MG capsule Commonly known as: MINIPRESS Take 1 capsule (1 mg total) by mouth at bedtime.  Indication: High Blood Pressure Disorder, Frightening Dreams   senna 8.6 MG Tabs tablet Commonly known as: SENOKOT Take 1 tablet (8.6 mg total) by mouth 2 (two)  times daily.    tamsulosin 0.4 MG Caps capsule Commonly known as:  FLOMAX Take 1 capsule (0.4 mg total) by mouth daily after supper.  Indication: Benign Enlargement of Prostate   traZODone 50 MG tablet Commonly known as: DESYREL Take 1 tablet (50 mg total) by mouth at bedtime as needed for sleep. What changed:  medication strength how much to take  Indication: [place] Follow up on 05/20/2022.   Specialty: Behavioral Health Why: You have an appointment scheduled for Monday July 24th at 1pm. Please arrive at 12:45pm to complete paperwork. ( in person). Contact information: [address] [phone]                Follow-up recommendations:  [facility]   Signed: [place], DO 04/26/2022, 11:29 AM

## 2022-04-26 NOTE — Consult Note (Signed)
  ECT: Patient seen today.  I also spoke with his permission to his daughter who had some questions about his discharge and ECT side effects.  Patient was in his room neatly dressed packing his belongings.  He knew that he was being discharged.  He acknowledged having some anxiety is 1 of which was about ongoing cognitive effects of ECT.  Affect appears better.  Still nervous but not nearly as down and depressed as previously.  Seems more optimistic about discharge.  I explained to the patient that the cognitive effects of ECT primarily the short-term memory loss and subjective feelings of confusion would likely continue for a short period of time after completing the treatment that by 2 weeks after his last treatment his short-term memory should have returned to normal.  Ultimately it should definitely return to normal.  I explained that he may always have some problems remembering the time that he was in the hospital but that ongoing new memory function would return.  I explained the same thing to his daughter.  Patient will not be recommended at this point for maintenance ECT but will be discharged with plan follow-up for regular outpatient psychiatric management.

## 2022-04-26 NOTE — Plan of Care (Signed)
Pt calm and cooperative on unit during shift.  Assistive device in place and non skid footwear in place.  Pt denies SI/HI/AV/H and contracts for safety.  Pt verbalized 2/10 Anxiety/Depression. Pt did have adequate intake and fluids.  Pt did have minimal participation during shift.  Pt verbalized importance of compliance with treatment plan. Q 15 minute safety rounding in place.  Will continue plan of care.    Problem: Education: Goal: Knowledge of General Education information will improve Description: Including pain rating scale, medication(s)/side effects and non-pharmacologic comfort measures Outcome: Progressing   Problem: Health Behavior/Discharge Planning: Goal: Ability to manage health-related needs will improve Outcome: Progressing   Problem: Clinical Measurements: Goal: Ability to maintain clinical measurements within normal limits will improve Outcome: Progressing

## 2022-04-30 NOTE — Anesthesia Postprocedure Evaluation (Signed)
Anesthesia Post Note  Patient: John [name]  Procedure(s) Performed: ECT TX  Patient location during evaluation: PACU Anesthesia Type: General Level of consciousness: awake and alert Pain management: pain level controlled Vital Signs Assessment: post-procedure vital signs reviewed and stable Respiratory status: spontaneous breathing, nonlabored ventilation, respiratory function stable and patient connected to nasal cannula oxygen Cardiovascular status: blood pressure returned to baseline and stable Postop Assessment: no apparent nausea or vomiting Anesthetic complications: no   No notable events documented.   Last Vitals:  Vitals:   04/25/22 1945 04/26/22 0805  BP: 114/74 115/80  Pulse: 73 64  Resp: 18 18  Temp: 36.6 C (!) 36.4 C  SpO2: 91% 99%    Last Pain:  Vitals:   04/26/22 0821  TempSrc:   PainSc: 0-No pain                 Martha Clan

## 2022-05-06 DIAGNOSIS — S72001A Fracture of unspecified part of neck of right femur, initial encounter for closed fracture: Secondary | ICD-10-CM | POA: Insufficient documentation

## 2022-05-06 DIAGNOSIS — L0390 Cellulitis, unspecified: Secondary | ICD-10-CM | POA: Insufficient documentation

## 2022-05-08 DIAGNOSIS — S72001D Fracture of unspecified part of neck of right femur, subsequent encounter for closed fracture with routine healing: Secondary | ICD-10-CM | POA: Insufficient documentation

## 2022-05-16 NOTE — Progress Notes (Signed)
Psychiatric Initial Adult Assessment   Patient Identification: John [name] MRN:  [id number] Date of Evaluation:  05/20/2022 Referral Source: Juluis Pitch, MD  Chief Complaint:  No chief complaint on file.  Visit Diagnosis:    ICD-10-CM   1. MDD (major depressive disorder), recurrent episode, moderate (HCC)  F33.1       History of Present Illness:   Cambridge Deleo is a 81 y.o. year old male with a history of depression, diabetes, hypertension, PAfib, sleep apnea, subdural hematoma, seizure, hip fracture,  right femur after mechanical fall s/p right hip arthroplasty on 6/11, who is referred for depression. - he was admitted to [facility] on [facility] 18th for worsening in depression, anxiety and looking for ways to kill himself. The course was complicated by newly found Afib, and mechanical fall s/p right hip arthroplasty. He had medication adjustment and completed ECT treatment during the admission.   He states that he fell during the admission at [facility].  He was not aware of fall, and does not have a memory of this.  He is wondering why this happened.  He is concerned whether he had a stroke.  He also reports that he has occasional difficulty in understanding his wife, although he is aware of the hearing loss as well.  He states that this goes back to the head injury he had when he was a teenager.  He had surgery for removal of the hematoma.  He tends to feel worried whenever he has pain or headache.  He also reports that he is concerned about his memory loss itself.  He tends to be reclusive.  Although he does not feel lonely as he is connected with his wife in certain way, he wishes not to isolate as much.  He thinks he tends to be by himself as he does not like others to see him as he does.  He tends to look at the computer, doing research symptoms he experiences such as A-fib.  He is concerned of this as there is a risk of the stroke.  He also reports concern of the risk from Abilify given it has a risk of  stroke.   Kayleen Memos, his wife presents to the interview with the patient request.  She states that he has been doing better since discharge.  However, he is not himself yet.  She agrees that he speaks in a high pitched voice.  She thinks he has been slow compared to before. He used to have more energy. His wife raises concern of Abilify due to its price. There is some improvement in his appetite.  Although ECT was helpful, she wishes he does not need to go through it again.    Depression-he has depressive symptoms as in PHQ-9.  He denies any SI since discharge from the hospital.  He denies gun access at home.  Although he is feeling better since discharge, he does not think his voice is expressive and "higher pitched" compared to before. Of note, he denies having SI when he came to [facility]. He states that he wanted to come to the hospital in case he feels suicidal.   Psychosis-he had AH of music, and hearing somebody talking last week.  He had VH of seeing something on the wall.  It has not happened since last week.   Substance use-he denies alcohol use or drug use   Medication- bupropion 300 mg daily, Abilify 5 mg,  (not taking trazodone)  Support: daughter Household: wife  Marital status: married for  more than 50 years Number of children: Employment:  [employer] Education:   Last PCP / ongoing medical evaluation:     Wt Readings from Last 3 Encounters:  05/20/22 190 lb (86.2 kg)  04/24/22 193 lb 8 oz (87.8 kg)  04/10/22 199 lb 8.3 oz (90.5 kg)     Associated Signs/Symptoms: Depression Symptoms:  depressed mood, anhedonia, fatigue, difficulty concentrating, anxiety, decreased appetite, (Hypo) Manic Symptoms:   denied decreased need for sleep, euphoria Anxiety Symptoms:   anxiety as above Psychotic Symptoms:   as above PTSD Symptoms: Negative  Past Psychiatric History:  Outpatient:  Psychiatry admission: multiple times for SI in the past Previous suicide attempt:  Past trials  of medication:  History of violence:    Previous Psychotropic Medications: Yes   Substance Abuse History in the last 12 months:  No.  Consequences of Substance Abuse: NA  Past Medical History:  Past Medical History:  Diagnosis Date   Cancer ([place])    Depression    Depression    Diabetes mellitus without complication (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 06/2015 CT Head: abnl thickening of mid-dist R cervical ICA w/o narrowing - ? vasculitis and thrombosed/healing dissection; b. 01/2021 Carotid U/S: 1-39% bilat ICA stenoses.   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 Age 60 - struck in head w/ baseball --> s/p craniotomy.   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 Approx 2015 - eval in [place] - conservatively managed.    Past Surgical History:  Procedure Laterality Date   ANKLE ARTHROSCOPY Left    fracture   CHOLECYSTECTOMY  2007   [place]   left frontal craniotomy for epidural hematoma   HIP ARTHROPLASTY Right 04/07/2022   Procedure: ARTHROPLASTY BIPOLAR HIP (HEMIARTHROPLASTY);  Surgeon: Earnestine Leys, MD;  Location: [facility] ORS;  Service: Orthopedics;  Laterality: Right;   INSERTION OF MESH  01/02/2022   Procedure: INSERTION OF MESH;  Surgeon: Herbert Pun, MD;  Location: [facility] ORS;  Service: General;;   LIGAMENT REPAIR Right    thumb   RETINAL DETACHMENT SURGERY     TREATMENT FISTULA ANAL  2006    Family Psychiatric History: Please see initial evaluation for full details. I have reviewed the history. No updates at this time.     Family History:  Family History  Problem Relation Age of Onset    Osteoporosis Mother    Depression Mother    Prostate cancer Father    Hypertension Father    Diabetes Brother    Kidney disease Neg Hx     Social History:   Social History   Socioeconomic History   Marital status: Married    Spouse name: [name]   Number of children: 2   Years of education: Not on file   Highest education level: Master's degree (e.g., MA, MS, MEng, MEd, MSW, MBA)  Occupational History   Not on file  Tobacco Use   Smoking status: Never  Smokeless tobacco: Never  Vaping Use   Vaping Use: Never used  Substance and Sexual Activity   Alcohol use: Not Currently   Drug use: No   Sexual activity: Yes    Birth control/protection: None  Other Topics Concern   Not on file  Social History Narrative   Live at home with wife.    Social Determinants of Health   Financial Resource Strain: Not on file  Food Insecurity: Not on file  Transportation Needs: Not on file  Physical Activity: Not on file  Stress: Not on file  Social Connections: Not on file    Additional Social History: as above  Allergies:  No Known Allergies  Metabolic Disorder Labs: Lab Results  Component Value Date   HGBA1C 5.6 03/19/2022   MPG 114.02 03/19/2022   No results found for: "PROLACTIN" Lab Results  Component Value Date   CHOL 131 03/19/2022   TRIG 92 03/19/2022   HDL 33 (L) 03/19/2022   CHOLHDL 4.0 03/19/2022   VLDL 18 03/19/2022   LDLCALC 80 03/19/2022   Lab Results  Component Value Date   TSH 0.766 04/01/2022    Therapeutic Level Labs: No results found for: "LITHIUM" No results found for: "CBMZ" No results found for: "VALPROATE"  Current Medications: Current Outpatient Medications  Medication Sig Dispense Refill   aspirin EC 81 MG tablet Take 81 mg by mouth daily.     buPROPion (WELLBUTRIN XL) 300 MG 24 hr tablet Take 1 tablet (300 mg total) by mouth daily. 30 tablet 3   buPROPion (WELLBUTRIN XL) 300 MG 24 hr tablet Take 1 tablet by mouth daily.     docusate  sodium (COLACE) 100 MG capsule Take 1 capsule (100 mg total) by mouth 2 (two) times daily. 10 capsule 0   methocarbamol (ROBAXIN) 500 MG tablet Take 1 tablet (500 mg total) by mouth every 6 (six) hours as needed for muscle spasms. 30 tablet 0   metoprolol tartrate (LOPRESSOR) 25 MG tablet Take 1 tablet (25 mg total) by mouth 2 (two) times daily. 60 tablet 3   polyethylene glycol (MIRALAX / GLYCOLAX) 17 g packet Take 17 g by mouth daily as needed. 14 each 0   prazosin (MINIPRESS) 1 MG capsule Take 1 capsule (1 mg total) by mouth at bedtime. 30 capsule 3   senna (SENOKOT) 8.6 MG TABS tablet Take 1 tablet (8.6 mg total) by mouth 2 (two) times daily. 120 tablet 0   sertraline (ZOLOFT) 25 MG tablet Take 1 tablet (25 mg total) by mouth at bedtime. 30 tablet 0   tamsulosin (FLOMAX) 0.4 MG CAPS capsule Take 1 capsule (0.4 mg total) by mouth daily after supper. 30 capsule 3   traZODone (DESYREL) 50 MG tablet Take 1 tablet (50 mg total) by mouth at bedtime as needed for sleep. (Patient not taking: Reported on 05/20/2022) 30 tablet 3   No current facility-administered medications for this visit.    Musculoskeletal: Strength & Muscle Tone: within normal limits Gait & Station:  uses with a walker Patient leans: N/A  Psychiatric Specialty Exam: Review of Systems  Psychiatric/Behavioral:  Positive for confusion, decreased concentration, dysphoric mood and hallucinations. Negative for agitation, behavioral problems, self-injury, sleep disturbance and suicidal ideas. The patient is nervous/anxious. The patient is not hyperactive.   All other systems reviewed and are negative.   Blood pressure (!) 152/86, pulse 65, temperature 97.9 F (36.6 C), temperature source Temporal, weight 190 lb (86.2 kg).Body mass index is 24.39 kg/m.  General Appearance: Fairly  Groomed  Eye Contact:  Good  Speech:  Clear and Coherent, slightly slow rate, normal tone, small volume, increase in latency  Volume:  Normal  Mood:   Anxious  Affect:  Appropriate, Congruent, and down  Thought Process:  Coherent  Orientation:  Full (Time, Place, and Person)  Thought Content:  Logical  Suicidal Thoughts:  No  Homicidal Thoughts:  No  Memory:  Immediate;   Good  Judgement:  Good  Insight:  Present  Psychomotor Activity:  Normal  Concentration:  Concentration: Fair and Attention Span: Fair  Recall:  Good  Fund of Knowledge:Good  Language: Good  Akathisia:  No  Handed:  Right  AIMS (if indicated):  not done  Assets:  Communication Skills Desire for Improvement  ADL's:  Intact  Cognition: WNL  Sleep:  Fair   Screenings: AIMS    Flowsheet Row Admission (Discharged) from 04/11/2022 in [place] Total Score 6      AUDIT    Flowsheet Row Admission (Discharged) from 04/04/2022 in [place] Admission (Discharged) from 03/13/2022 in [place]  Alcohol Use Disorder Identification Test Final Score (AUDIT) 0 0      ECT-MADRS    Flowsheet Row Admission (Discharged) from 03/13/2022 in [place] Total Score 42      GAD-7    [place] Office Visit from 05/20/2022 in [place]  Total GAD-7 Score 7      Mini-Mental    Flowsheet Row Admission (Discharged) from 03/13/2022 in [place]  Total Score (max 30 points ) 22      PHQ2-9    [place] Visit from 05/20/2022 in [place] from 12/21/2021 in [place] from 04/22/2017 in [place]  PHQ-2 Total Score 2 0 0  PHQ-9 Total Score 11 -- --      [place] Visit from 05/20/2022 in [place] Admission (Discharged) from 04/11/2022 in [place] Admission (Discharged) from 04/06/2022 in [place]  (1A)  C-SSRS RISK CATEGORY Error: Q3, 4, or 5 should not be populated when Q2 is No No Risk No Risk       Assessment and Plan:  Ever Gustafson is a 81 y.o. year old male with a history of depression, diabetes, hypertension, PAfib, sleep apnea, subdural hematoma, seizure, hip fracture,  right femur after mechanical fall s/p right hip arthroplasty on 6/11, who is referred for depression.This is after care, being discharged from [facility] hospital.   1. MDD (major depressive disorder), recurrent episode, moderate ([place]) Exam is notable for slight increase in speech latency, and he reports depressive symptoms, although it has been improving since he was discharged from [facility].  Psychosocial stressors includes newly found A fib, hip fracture, and a financial strain.  Both the patient and his wife strongly hopes to discontinue Abilify due to financial strain and its potential risk of stroke.  Although it was discussed regarding its benefit of staying on the medication/risk of relapse in his mood symptoms, will taper off this medication to honor the patient preference.  Will start sertraline to target depression and anxiety.  Discussed potential risk of GI side effect.  Will continue bupropion at the current dose at this time to target depression.  Noted that he has a history of seizure, and will not do further uptitration at this time.   #  r/o cognitive impairment He reports worsening in memory loss.  It is difficult to discern in the context of active mood symptoms.  Noted that he also did have hip fracture and completed ECT about a month ago, which could contribute to cognitive deficits.  He agrees to prioritize treatment of his mood symptoms while continuing to assess this.    Plan Start sertraline 25 mg at night  Continue bupropion 300 mg daily  Decrease Abilify 2.5 mg daily for one week, then discontinue Next appointment: 8/15 at 1:20 for 40 mins, in person  The patient demonstrates the  following risk factors for suicide: Chronic risk factors for suicide include: psychiatric disorder of depression and previous suicide attempts of putting a bag over his head many years ago, but then he took it off. . Acute risk factors for suicide include: unemployment and loss (financial, interpersonal, professional). Protective factors for this patient include: positive social support and hope for the future. Considering these factors, the overall suicide risk at this point appears to be low. Patient is appropriate for outpatient follow up.     Collaboration of Care: Other obtain collaterals from his wife, reviewed chart  Patient/Guardian was advised Release of Information must be obtained prior to any record release in order to collaborate their care with an outside provider. Patient/Guardian was advised if they have not already done so to contact the registration department to sign all necessary forms in order for [place] to release information regarding their care.   Consent: Patient/Guardian gives verbal consent for treatment and assignment of benefits for services provided during this visit. Patient/Guardian expressed understanding and agreed to proceed.   The duration of this appointment visit was 50 minutes of face-to-face time with the patient.  Greater than 50% of this time was spent in counseling, explanation of  diagnosis, planning of further management, and coordination of care.   Norman Clay, MD 7/24/20233:55 PM

## 2022-05-20 ENCOUNTER — Encounter: Payer: Self-pay | Admitting: Psychiatry

## 2022-05-20 ENCOUNTER — Ambulatory Visit (INDEPENDENT_AMBULATORY_CARE_PROVIDER_SITE_OTHER): Payer: Medicare Other | Admitting: Psychiatry

## 2022-05-20 VITALS — BP 152/86 | HR 65 | Temp 97.9°F | Wt 190.0 lb

## 2022-05-20 DIAGNOSIS — F331 Major depressive disorder, recurrent, moderate: Secondary | ICD-10-CM | POA: Diagnosis not present

## 2022-05-20 MED ORDER — SERTRALINE HCL 25 MG PO TABS: 25.0000 mg | ORAL_TABLET | Freq: Every day | ORAL | 0 refills | Status: DC

## 2022-05-22 ENCOUNTER — Ambulatory Visit (INDEPENDENT_AMBULATORY_CARE_PROVIDER_SITE_OTHER): Payer: Medicare Other | Admitting: Podiatry

## 2022-05-22 ENCOUNTER — Encounter: Payer: Self-pay | Admitting: Podiatry

## 2022-05-22 ENCOUNTER — Telehealth: Payer: Self-pay

## 2022-05-22 DIAGNOSIS — I6523 Occlusion and stenosis of bilateral carotid arteries: Secondary | ICD-10-CM

## 2022-05-22 DIAGNOSIS — B351 Tinea unguium: Secondary | ICD-10-CM

## 2022-05-22 DIAGNOSIS — E1142 Type 2 diabetes mellitus with diabetic polyneuropathy: Secondary | ICD-10-CM

## 2022-05-22 DIAGNOSIS — M79676 Pain in unspecified toe(s): Secondary | ICD-10-CM | POA: Diagnosis not present

## 2022-05-22 NOTE — Telephone Encounter (Signed)
Try one of these moisturizers for the dry skin on your feet-  1) Cetaphil  2) CeraVe  3) Eucerin  4) Aquaphor

## 2022-05-22 NOTE — Progress Notes (Signed)
Subjective:  Patient ID: John [name], male    DOB: November 26, 1940,  MRN: [id number] HPI Chief Complaint  Patient presents with   Debridement    Requesting nail trim and diabetic foot exam - last a1c was 5.6   New Patient (Initial Visit)    Est pt 2015    81 y.o. male presents with the above complaint.   ROS: Denies fever chills nausea vomiting muscle aches pains calf pain back pain chest pain shortness of breath.  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 06/2015 CT Head: abnl thickening of mid-dist R cervical ICA w/o narrowing - ? vasculitis and thrombosed/healing dissection; b. 01/2021 Carotid U/S: 1-39% bilat ICA stenoses.   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 Age 27 - struck in head w/ baseball --> s/p craniotomy.   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 Approx 2015 - eval in [place] - conservatively managed.   Past Surgical History:  Procedure Laterality Date   ANKLE ARTHROSCOPY Left    fracture   CHOLECYSTECTOMY  2007   [place]   left frontal craniotomy for epidural hematoma   HIP ARTHROPLASTY Right 04/07/2022   Procedure: ARTHROPLASTY BIPOLAR HIP (HEMIARTHROPLASTY);  Surgeon: Earnestine Leys, MD;  Location: [facility] ORS;  Service: Orthopedics;  Laterality: Right;   INSERTION OF MESH  01/02/2022   Procedure: INSERTION OF MESH;  Surgeon: Herbert Pun, MD;  Location: [facility] ORS;  Service: General;;   LIGAMENT REPAIR Right    thumb   RETINAL DETACHMENT SURGERY     TREATMENT FISTULA  ANAL  2006    Current Outpatient Medications:    aspirin EC 81 MG tablet, Take 81 mg by mouth daily., Disp: , Rfl:    buPROPion (WELLBUTRIN XL) 300 MG 24 hr tablet, Take 1 tablet (300 mg total) by mouth daily., Disp: 30 tablet, Rfl: 3   buPROPion (WELLBUTRIN XL) 300 MG 24 hr tablet, Take 1 tablet by mouth daily., Disp: , Rfl:    docusate sodium (COLACE) 100 MG capsule, Take 1 capsule (100 mg total) by mouth 2 (two) times daily., Disp: 10 capsule, Rfl: 0   methocarbamol (ROBAXIN) 500 MG tablet, Take 1 tablet (500 mg total) by mouth every 6 (six) hours as needed for muscle spasms., Disp: 30 tablet, Rfl: 0   metoprolol tartrate (LOPRESSOR) 25 MG tablet, Take 1 tablet (25 mg total) by mouth 2 (two) times daily., Disp: 60 tablet, Rfl: 3   polyethylene glycol (MIRALAX / GLYCOLAX) 17 g packet, Take 17 g by mouth daily as needed., Disp: 14 each, Rfl: 0   prazosin (MINIPRESS) 1 MG capsule, Take 1 capsule (1 mg total) by mouth at bedtime., Disp: 30 capsule, Rfl: 3   senna (SENOKOT) 8.6 MG TABS tablet, Take 1 tablet (8.6 mg total) by mouth 2 (two) times daily., Disp:  120 tablet, Rfl: 0   sertraline (ZOLOFT) 25 MG tablet, Take 1 tablet (25 mg total) by mouth at bedtime., Disp: 30 tablet, Rfl: 0   tamsulosin (FLOMAX) 0.4 MG CAPS capsule, Take 1 capsule (0.4 mg total) by mouth daily after supper., Disp: 30 capsule, Rfl: 3   traZODone (DESYREL) 50 MG tablet, Take 1 tablet (50 mg total) by mouth at bedtime as needed for sleep. (Patient not taking: Reported on 05/20/2022), Disp: 30 tablet, Rfl: 3  No Known Allergies Review of Systems Objective:  There were no vitals filed for this visit.  General: Well developed, nourished, in no acute distress, alert and oriented x3   Dermatological: Skin is warm, dry and supple bilateral. Nails x 10 are thick yellow dystrophic clinically mycotic remaining integument appears unremarkable at this time. There are no open sores, no preulcerative lesions, no rash or signs of  infection present.  Vascular: Dorsalis Pedis artery and Posterior Tibial artery pedal pulses are 2/4 bilateral with immedate capillary fill time. Pedal hair growth present. No varicosities and no lower extremity edema present bilateral.   Neruologic: Grossly intact via light touch bilateral. Vibratory intact via tuning fork bilateral. Protective threshold with Semmes Wienstein monofilament intact to all pedal sites bilateral. Patellar and Achilles deep tendon reflexes 2+ bilateral. No Babinski or clonus noted bilateral.   Musculoskeletal: No gross boney pedal deformities bilateral. No pain, crepitus, or limitation noted with foot and ankle range of motion bilateral. Muscular strength 5/5 in all groups tested bilateral.  Gait: Unassisted, Nonantalgic.    Radiographs:  None taken  Assessment & Plan:   Assessment: Pain in limb secondary to onychomycosis.  Plan: Debridement of toenails 1 through 5 bilateral.     John [name] T. [employer], [place]

## 2022-06-09 NOTE — Progress Notes (Unsigned)
BH MD/PA/NP OP Progress Note  06/11/2022 4:42 PM [place] List  MRN:  [id number]  Chief Complaint:  Chief Complaint  Patient presents with   Follow-up   HPI:  This is a follow-up appointment for depression.  He states that his hip pain has been better.  He has been able to work around the house.  He tends to sit in the office, working on computers.  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  He has difficulty in focus.  He also has difficulty in initiating conversation, understanding, and "words connecting."  He tends to feel overwhelmed. The patient has mood symptoms as in PHQ-9/GAD-7.  He has occasional insomnia.  He thinks his appetite has been good despite recent weight loss.  He denies SI, HI.  He denies hallucinations or paranoia. Of note, he was explained many times to discontinue prazosin and reviewed this with written instruction. After this, he asks if he should continue prazosin.   Caroll, his wife presents to the interview.  She states that there is not much progress since the last visit.  She shared a note from their son about his concern (it will be scanned).  According to the note, the patient appears to be "out of it," anxious, and does not engage in the conversation.  His son wonders if it is related to the recent surgery, medication for sleep.  He also wonders if he had a CT scan, and blood test/thyroid.  Discussed both with her and the patient that the current etiology of him struggling with cognition is likely multifactorial.  It is not uncommon that people have delirium after surgery/being given medication for sleep, and it can certainly contributing to his current state.  It is unknown whether he will regain his capacity fully, or only to a certain extent.  It was explained that he also has other conditions including having undergone ECT treatment, and depression.  According to the Head CT scan in June, there is  no significant abnormality except cerebral atrophy and microvascular changes; he may be vulnerable to delirium at his baseline.  TSH in June was within normal limit.  His wife reports her concern that he will end physical therapy soon.  They were amazing, and she is concerned after he finishes this treatment. She thinks he should be seen by psychologist every week.  It was discussed with her that although it could be beneficial, the resource may be limited.  She also states that she wishes him to be seen by a male, and who speaks [place] as a first language.  She states that she was very frustrated when she had a call from somebody in the hospital (she does not think it was this Probation officer nor from this clinic), and she could not understand what this person stated.  She verbalized understanding to let this writer know if any difficulty in understanding to ensure better communication if they were to continue to see this Probation officer.    Wt Readings from Last 3 Encounters:  06/11/22 180 lb 3.2 oz (81.7 kg)  05/20/22 190 lb (86.2 kg)  04/24/22 193 lb 8 oz (87.8 kg)     Visit Diagnosis:    ICD-10-CM   1. MDD (major depressive disorder), recurrent episode, moderate ([place])  F33.1 Ambulatory referral to Occupational Therapy      Past Psychiatric History: Please see initial evaluation for full details. I have reviewed the history. No updates  at this time.     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)    a. 06/2015 CT Head: abnl thickening of mid-dist R cervical ICA w/o narrowing - ? vasculitis and thrombosed/healing dissection; b. 01/2021 Carotid U/S: 1-39% bilat ICA stenoses.   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 Age 87 - struck in head w/ baseball --> s/p craniotomy.   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 Approx 2015 - eval in [place] - conservatively managed.    Past Surgical History:  Procedure Laterality Date   ANKLE ARTHROSCOPY Left    fracture   CHOLECYSTECTOMY  2007   [place]   left frontal craniotomy for epidural hematoma   HIP ARTHROPLASTY Right 04/07/2022   Procedure: ARTHROPLASTY BIPOLAR HIP (HEMIARTHROPLASTY);  Surgeon: Earnestine Leys, MD;  Location: [facility] ORS;  Service: Orthopedics;  Laterality: Right;   INSERTION OF MESH  01/02/2022   Procedure: INSERTION OF MESH;  Surgeon: Herbert Pun, MD;  Location: [facility] ORS;  Service: General;;   LIGAMENT REPAIR Right    thumb   RETINAL DETACHMENT SURGERY     TREATMENT FISTULA ANAL  2006    Family Psychiatric History: Please see initial evaluation for full details. I have reviewed the history. No updates at this time.     Family History:  Family History  Problem Relation Age of Onset   Osteoporosis Mother    Depression Mother    Prostate cancer Father    Hypertension Father    Diabetes Brother    Kidney disease Neg Hx     Social History:  Social History   Socioeconomic History   Marital status: Married    Spouse name: [name]   Number of children: 2   Years of education: Not on file   Highest education level: Master's degree (e.g., MA, MS, MEng, MEd, MSW, MBA)  Occupational History   Not on file  Tobacco Use   Smoking status: Never   Smokeless tobacco: Never  Vaping Use   Vaping Use: Never used  Substance and Sexual Activity   Alcohol use: Not Currently   Drug use: No   Sexual activity: Yes    Birth control/protection: None  Other Topics Concern   Not on file  Social History Narrative   Live at  home with wife.    Social Determinants of Health   Financial Resource Strain: Not on file  Food Insecurity: Not on file  Transportation Needs: Not on file  Physical Activity: Not on file  Stress: Not on file  Social Connections: Not on file    Allergies: No Known Allergies  Metabolic Disorder Labs: Lab Results  Component Value Date   HGBA1C 5.6 03/19/2022   MPG 114.02 03/19/2022   No results found for: "PROLACTIN" Lab Results  Component Value Date  CHOL 131 03/19/2022   TRIG 92 03/19/2022   HDL 33 (L) 03/19/2022   CHOLHDL 4.0 03/19/2022   VLDL 18 03/19/2022   LDLCALC 80 03/19/2022   Lab Results  Component Value Date   TSH 0.766 04/01/2022    Therapeutic Level Labs: No results found for: "LITHIUM" No results found for: "VALPROATE" No results found for: "CBMZ"  Current Medications: Current Outpatient Medications  Medication Sig Dispense Refill   aspirin EC 81 MG tablet Take 81 mg by mouth daily.     buPROPion (WELLBUTRIN XL) 300 MG 24 hr tablet Take 1 tablet by mouth daily.     docusate sodium (COLACE) 100 MG capsule Take 1 capsule (100 mg total) by mouth 2 (two) times daily. 10 capsule 0   methocarbamol (ROBAXIN) 500 MG tablet Take 1 tablet (500 mg total) by mouth every 6 (six) hours as needed for muscle spasms. 30 tablet 0   metoprolol tartrate (LOPRESSOR) 25 MG tablet Take 1 tablet (25 mg total) by mouth 2 (two) times daily. 60 tablet 3   polyethylene glycol (MIRALAX / GLYCOLAX) 17 g packet Take 17 g by mouth daily as needed. 14 each 0   senna (SENOKOT) 8.6 MG TABS tablet Take 1 tablet (8.6 mg total) by mouth 2 (two) times daily. 120 tablet 0   sertraline (ZOLOFT) 50 MG tablet Take 1 tablet (50 mg total) by mouth daily. 30 tablet 1   tamsulosin (FLOMAX) 0.4 MG CAPS capsule Take 1 capsule (0.4 mg total) by mouth daily after supper. 30 capsule 3   traZODone (DESYREL) 50 MG tablet Take 1 tablet (50 mg total) by mouth at bedtime as needed for sleep. 30 tablet 3    [START ON 06/20/2022] sertraline (ZOLOFT) 50 MG tablet Take 1 tablet (50 mg total) by mouth at bedtime. 30 tablet 0   No current facility-administered medications for this visit.     Musculoskeletal: Strength & Muscle Tone: within normal limits Gait & Station:  uses a cane Patient leans: N/A  Psychiatric Specialty Exam: Review of Systems  Psychiatric/Behavioral:  Positive for decreased concentration, dysphoric mood and sleep disturbance. Negative for agitation, behavioral problems, confusion, hallucinations, self-injury and suicidal ideas. The patient is nervous/anxious. The patient is not hyperactive.   All other systems reviewed and are negative.   Blood pressure (!) 161/90, pulse 62, temperature 97.9 F (36.6 C), temperature source Temporal, weight 180 lb 3.2 oz (81.7 kg).Body mass index is 23.14 kg/m.  General Appearance: Fairly Groomed  Eye Contact:  Fair  Speech:  Clear and Coherent increased speech latency  Volume:  Normal  Mood:  Depressed  Affect:  Appropriate, Congruent, and Restricted  Thought Process:  Coherent  Orientation:  Full (Time, Place, and Person)  Thought Content: Logical   Suicidal Thoughts:  No  Homicidal Thoughts:  No  Memory:  Immediate;   Good  Judgement:  Good  Insight:  Present  Psychomotor Activity:  Decreased  Concentration:  Concentration: Fair and Attention Span: Fair  Recall:  Poor  Fund of Knowledge: Good  Language: Good  Akathisia:  No  Handed:  Right  AIMS (if indicated): not done  Assets:  Desire for Improvement Social Support  ADL's:  Intact  Cognition: WNL  Sleep:  Fair   Screenings: AIMS    Flowsheet Row Admission (Discharged) from 04/11/2022 in [place] Total Score 6      AUDIT    [place] Admission (Discharged) from 04/04/2022 in [place] Admission (  Discharged) from 03/13/2022 in [place]  Alcohol Use Disorder Identification Test  Final Score (AUDIT) 0 0      ECT-MADRS    Flowsheet Row Admission (Discharged) from 03/13/2022 in [place] Total Score [place] Office Visit from 06/11/2022 in [place] Office Visit from 05/20/2022 in [place]  Total GAD-7 Score 10 7      Mini-Mental    Flowsheet Row Admission (Discharged) from 03/13/2022 in [facility]  Total Score (max 30 points ) 22      PHQ2-9    [place] Visit from 06/11/2022 in [facility] Office Visit from 05/20/2022 in [facility] from 12/21/2021 in [facility] from 04/22/2017 in [facility]  PHQ-2 Total Score 4 2 0 0  PHQ-9 Total Score 13 11 -- --      [place] Office Visit from 06/11/2022 in [facility] Office Visit from 05/20/2022 in [facility] [employer] Admission (Discharged) from 04/11/2022 in [place] Error: Q3, 4, or 5 should not be populated when Q2 is No Error: Q3, 4, or 5 should not be populated when Q2 is No No Risk        Assessment and Plan:  John [name] is a 81 y.o. year old male with a history of depression, diabetes, hypertension, PAfib, sleep apnea, subdural hematoma, seizure, hip fracture,  right femur after mechanical fall s/p right hip arthroplasty on 6/11 who presents for follow up appointment for below.   1. MDD (major depressive disorder), recurrent episode, moderate (HCC) There is slight improvement in speech latency and rumination on his medical condition, although he continues to report depressive symptoms and cognitive impairment since the last visit. Psychosocial stressors includes newly found A fib, hip fracture, and financial strain.  He tolerated well to  sertraline.  Will uptitrate sertraline to optimize treatment for depression.  Potential GI side effect.  Will continue bupropion to target depression. Noted that he has a history of seizure, and will not do further uptitration at this time.  Noted that he may have been on prazosin according to the chart review; he has no known history of trauma.  Discontinue this medication to avoid the potential risk of fall.  Noted that her his wife reports strong preference to be seen by psychologist every week.  It was discussed with her that the resources will likely be limited in that aspect.  She was advised to contact his insurance company to find [employer] provider.    # r/o cognitive impairment Exam is notable for impairment in short term recall , and he complains of memory loss, difficulty in concentration, expressive/receptive aphasia,. He is alert and is able to sustain attention during the exam.  Head CT without remarkable findings except cerebral atrophy and microvascular changes.  TSH is within normal limit.  Etiology is likely multifactorial given recent hip fracture/surgery/ECT treatment/mood symptoms as above.  Will continue to work on the above treatment.  Will make a referral to OT for further assessment.   # Consent to release information to his son Edel Rivero [phone], [email or web address]) - patient declined. The Probation officer received a letter from his son to contact him to discuss his concern (the letter will be scanned). When he was asked whether his son could be included in  his treatment plan, he declined this.  He is concerned about himself being taken care of by his children.  He verbalized understanding to consider this in a way of them being involved to optimize his treatment as much as possible as they are concerned about his current state.  It is discussed with him that although the information is confidential, there are exceptions when there is any concern of danger to himself or others.   Will not plan to contact his son to honor the patient request at this time.     Plan Increase sertraline 50 mg at night  Continue bupropion 300 mg daily  Discontinue prazosin Next appointment: 10/2 at 1 PM for 40 mins, in person Referral for [facility] assessment/OT    The patient demonstrates the following risk factors for suicide: Chronic risk factors for suicide include: psychiatric disorder of depression and previous suicide attempts of putting a bag over his head many years ago, but then he took it off. . Acute risk factors for suicide include: unemployment and loss (financial, interpersonal, professional). Protective factors for this patient include: positive social support and hope for the future. Considering these factors, the overall suicide risk at this point appears to be low. Patient is appropriate for outpatient follow up.         The duration of this appointment visit was 45 minutes of face-to-face time with the patient.  Greater than 50% of this time was spent in counseling, explanation of  diagnosis, planning of further management, and coordination of care.   Collaboration of Care: Collaboration of Care: Other collaborate with his wife  Patient/Guardian was advised Release of Information must be obtained prior to any record release in order to collaborate their care with an outside provider. Patient/Guardian was advised if they have not already done so to contact the registration department to sign all necessary forms in order for [place] to release information regarding their care.   Consent: Patient/Guardian gives verbal consent for treatment and assignment of benefits for services provided during this visit. Patient/Guardian expressed understanding and agreed to proceed.    Norman Clay, MD 06/11/2022, 4:42 PM

## 2022-06-11 ENCOUNTER — Encounter: Payer: Self-pay | Admitting: Psychiatry

## 2022-06-11 ENCOUNTER — Ambulatory Visit (INDEPENDENT_AMBULATORY_CARE_PROVIDER_SITE_OTHER): Payer: Medicare Other | Admitting: Psychiatry

## 2022-06-11 VITALS — BP 161/90 | HR 62 | Temp 97.9°F | Wt 180.2 lb

## 2022-06-11 DIAGNOSIS — F331 Major depressive disorder, recurrent, moderate: Secondary | ICD-10-CM

## 2022-06-11 MED ORDER — SERTRALINE HCL 50 MG PO TABS: 50.0000 mg | ORAL_TABLET | Freq: Every day | ORAL | 1 refills | Status: DC

## 2022-06-11 MED ORDER — SERTRALINE HCL 50 MG PO TABS: 50.0000 mg | ORAL_TABLET | Freq: Every day | ORAL | 0 refills | Status: DC

## 2022-06-11 NOTE — Patient Instructions (Signed)
Increase sertraline 50 mg at night  Continue bupropion 300 mg daily  Discontinue prazosin Next appointment: 10/2 at 1 PM , in person Referral for [facility] assessment/OT

## 2022-06-25 ENCOUNTER — Telehealth: Payer: Self-pay | Admitting: Psychiatry

## 2022-06-25 NOTE — Telephone Encounter (Signed)
Received a notification from his PCP that they want to get update regarding OT referral. It seems like OT did leave a voice message to them- could you contact him and check that voice message and/or give them the number to call? OT did accepted for this referral, fyi. Thanks!

## 2022-07-02 ENCOUNTER — Telehealth: Payer: Self-pay | Admitting: Psychiatry

## 2022-07-02 ENCOUNTER — Emergency Department
Admission: EM | Admit: 2022-07-02 | Discharge: 2022-07-02 | Discharge status: Against Medical Advice | Payer: Medicare Other | Attending: Emergency Medicine | Admitting: Emergency Medicine

## 2022-07-02 DIAGNOSIS — F32A Depression, unspecified: Secondary | ICD-10-CM | POA: Insufficient documentation

## 2022-07-02 DIAGNOSIS — R4182 Altered mental status, unspecified: Secondary | ICD-10-CM | POA: Diagnosis not present

## 2022-07-02 DIAGNOSIS — Z5321 Procedure and treatment not carried out due to patient leaving prior to being seen by health care provider: Secondary | ICD-10-CM | POA: Insufficient documentation

## 2022-07-02 DIAGNOSIS — F332 Major depressive disorder, recurrent severe without psychotic features: Secondary | ICD-10-CM | POA: Diagnosis not present

## 2022-07-02 LAB — CBC
HCT: 49.2 % (ref 39.0–52.0)
Hemoglobin: 16.7 g/dL (ref 13.0–17.0)
MCH: 32.1 pg (ref 26.0–34.0)
MCHC: 33.9 g/dL (ref 30.0–36.0)
MCV: 94.4 fL (ref 80.0–100.0)
Platelets: 185 10*3/uL (ref 150–400)
RBC: 5.21 MIL/uL (ref 4.22–5.81)
RDW: 13.5 % (ref 11.5–15.5)
WBC: 6.5 10*3/uL (ref 4.0–10.5)
nRBC: 0 % (ref 0.0–0.2)

## 2022-07-02 LAB — COMPREHENSIVE METABOLIC PANEL
ALT: 42 U/L (ref 0–44)
AST: 46 U/L — ABNORMAL HIGH (ref 15–41)
Albumin: 4 g/dL (ref 3.5–5.0)
Alkaline Phosphatase: 78 U/L (ref 38–126)
Anion gap: 15 (ref 5–15)
BUN: 28 mg/dL — ABNORMAL HIGH (ref 8–23)
CO2: 18 mmol/L — ABNORMAL LOW (ref 22–32)
Calcium: 9.5 mg/dL (ref 8.9–10.3)
Chloride: 110 mmol/L (ref 98–111)
Creatinine, Ser: 0.78 mg/dL (ref 0.61–1.24)
GFR, Estimated: >60 mL/min (ref >60)
Glucose, Bld: 93 mg/dL (ref 70–99)
Potassium: 3.7 mmol/L (ref 3.5–5.1)
Sodium: 143 mmol/L (ref 135–145)
Total Bilirubin: 1.9 mg/dL — ABNORMAL HIGH (ref 0.3–1.2)
Total Protein: 6.4 g/dL — ABNORMAL LOW (ref 6.5–8.1)

## 2022-07-02 LAB — ETHANOL: Alcohol, Ethyl (B): <10 mg/dL (ref <10)

## 2022-07-02 LAB — URINE DRUG SCREEN, QUALITATIVE (ARMC ONLY)
Amphetamines, Ur Screen: NOT DETECTED
Barbiturates, Ur Screen: NOT DETECTED
Benzodiazepine, Ur Scrn: NOT DETECTED
Cannabinoid 50 Ng, Ur ~~LOC~~: NOT DETECTED
Cocaine Metabolite,Ur ~~LOC~~: NOT DETECTED
MDMA (Ecstasy)Ur Screen: NOT DETECTED
Methadone Scn, Ur: NOT DETECTED
Opiate, Ur Screen: NOT DETECTED
Phencyclidine (PCP) Ur S: NOT DETECTED
Tricyclic, Ur Screen: NOT DETECTED

## 2022-07-02 LAB — ACETAMINOPHEN LEVEL: Acetaminophen (Tylenol), Serum: <10 ug/mL — ABNORMAL LOW (ref 10–30)

## 2022-07-02 LAB — SALICYLATE LEVEL: Salicylate Lvl: <7 mg/dL — ABNORMAL LOW (ref 7.0–30.0)

## 2022-07-02 NOTE — Telephone Encounter (Signed)
pt wife called she wants to speak with dr. Modesta Messing directly. she states that the medicaitions is over whelming for both of them.  I tried to get more details and that is all she would say.  I offered virtural appt for tomorrow but she declined.  I advised going to the ER and she wants him admitted.  I told her that he would have to go to the ER for admission if they felt necessary.  She wouldn't give any other information.

## 2022-07-02 NOTE — Telephone Encounter (Signed)
Discussed with both the patient and his wife, [name].  [name] states that he is not doing well and is confused, although she cannot elaborate it. She cannot be on top of his medication, so she is unsure about his medication. She and the patient both thinks he has some fever and shortness of breath. He eats and sleeps well. He is oriented x 4. He denies SI/HI/hallucinations. [name] thinks more is going on with him, and thinks he needs to be admitted, although she cannot elaborate this. Discussed the following.  - Bring him to walk in clinic or ED to assess him physically due to fever, reported confusion, and shortness of breath - Next appointment- 9/14 at 3 30 for in person

## 2022-07-02 NOTE — Telephone Encounter (Signed)
Wife, John [name], has called wanting a call back. Already left message on nurse line. She did not mention that prior to me opening this note. John [name] is anxious regarding his medication and request a call back.

## 2022-07-02 NOTE — ED Triage Notes (Signed)
Patient is here with his wife for worsening depression and changes in his demeanor; Patient does not answer most questions and when he does, it is very soft spoken with minimal words; Patient appears absent during triage but has no complaints

## 2022-07-03 ENCOUNTER — Emergency Department (EMERGENCY_DEPARTMENT_HOSPITAL)
Admission: EM | Admit: 2022-07-03 | Discharge: 2022-07-04 | Disposition: A | Discharge status: Other Acute Inpatient Hospital | Payer: Medicare Other | Source: Home / Self Care | Attending: Emergency Medicine | Admitting: Emergency Medicine

## 2022-07-03 ENCOUNTER — Encounter: Payer: Self-pay | Admitting: Medical Oncology

## 2022-07-03 DIAGNOSIS — Z20822 Contact with and (suspected) exposure to covid-19: Secondary | ICD-10-CM | POA: Insufficient documentation

## 2022-07-03 DIAGNOSIS — F419 Anxiety disorder, unspecified: Secondary | ICD-10-CM

## 2022-07-03 DIAGNOSIS — I10 Essential (primary) hypertension: Secondary | ICD-10-CM | POA: Insufficient documentation

## 2022-07-03 DIAGNOSIS — E119 Type 2 diabetes mellitus without complications: Secondary | ICD-10-CM | POA: Insufficient documentation

## 2022-07-03 DIAGNOSIS — Z8546 Personal history of malignant neoplasm of prostate: Secondary | ICD-10-CM | POA: Insufficient documentation

## 2022-07-03 DIAGNOSIS — F332 Major depressive disorder, recurrent severe without psychotic features: Secondary | ICD-10-CM | POA: Insufficient documentation

## 2022-07-03 DIAGNOSIS — F32A Depression, unspecified: Secondary | ICD-10-CM

## 2022-07-03 DIAGNOSIS — Z96641 Presence of right artificial hip joint: Secondary | ICD-10-CM | POA: Insufficient documentation

## 2022-07-03 LAB — URINALYSIS, COMPLETE (UACMP) WITH MICROSCOPIC
Bilirubin Urine: NEGATIVE
Glucose, UA: NEGATIVE mg/dL
Hgb urine dipstick: NEGATIVE
Ketones, ur: 20 mg/dL — AB
Leukocytes,Ua: NEGATIVE
Nitrite: NEGATIVE
Protein, ur: NEGATIVE mg/dL
Specific Gravity, Urine: 1.027 (ref 1.005–1.030)
pH: 5 (ref 5.0–8.0)

## 2022-07-03 LAB — SARS CORONAVIRUS 2 BY RT PCR: SARS Coronavirus 2 by RT PCR: NEGATIVE

## 2022-07-03 MED ORDER — TAMSULOSIN HCL 0.4 MG PO CAPS: 0.4000 mg | ORAL_CAPSULE | Freq: Every day | ORAL | Status: DC

## 2022-07-03 MED ORDER — ASPIRIN 81 MG PO TBEC
81.0000 mg | DELAYED_RELEASE_TABLET | Freq: Every day | ORAL | Status: DC
  Administered 2022-07-04: 81 mg via ORAL
  Filled 2022-07-03: qty 1

## 2022-07-03 MED ORDER — TRAZODONE HCL 50 MG PO TABS
50.0000 mg | ORAL_TABLET | Freq: Every evening | ORAL | Status: DC | PRN
  Administered 2022-07-03: 50 mg via ORAL
  Filled 2022-07-03: qty 1

## 2022-07-03 MED ORDER — POLYETHYLENE GLYCOL 3350 17 G PO PACK: 17.0000 g | PACK | Freq: Every day | ORAL | Status: DC | PRN

## 2022-07-03 MED ORDER — SERTRALINE HCL 50 MG PO TABS
50.0000 mg | ORAL_TABLET | Freq: Every day | ORAL | Status: DC
  Administered 2022-07-03: 50 mg via ORAL
  Filled 2022-07-03: qty 1

## 2022-07-03 MED ORDER — SENNA 8.6 MG PO TABS
1.0000 | ORAL_TABLET | Freq: Two times a day (BID) | ORAL | Status: DC
  Administered 2022-07-03 – 2022-07-04 (×2): 8.6 mg via ORAL
  Filled 2022-07-03 (×2): qty 1

## 2022-07-03 MED ORDER — DOCUSATE SODIUM 100 MG PO CAPS
100.0000 mg | ORAL_CAPSULE | Freq: Two times a day (BID) | ORAL | Status: DC
  Administered 2022-07-03 – 2022-07-04 (×2): 100 mg via ORAL
  Filled 2022-07-03 (×2): qty 1

## 2022-07-03 MED ORDER — METHOCARBAMOL 500 MG PO TABS: 500.0000 mg | ORAL_TABLET | Freq: Four times a day (QID) | ORAL | Status: DC | PRN

## 2022-07-03 MED ORDER — BUPROPION HCL ER (XL) 150 MG PO TB24
300.0000 mg | ORAL_TABLET | Freq: Every day | ORAL | Status: DC
  Administered 2022-07-04: 300 mg via ORAL
  Filled 2022-07-03: qty 2

## 2022-07-03 MED ORDER — METOPROLOL TARTRATE 25 MG PO TABS
25.0000 mg | ORAL_TABLET | Freq: Two times a day (BID) | ORAL | Status: DC
  Administered 2022-07-03 – 2022-07-04 (×2): 25 mg via ORAL
  Filled 2022-07-03 (×2): qty 1

## 2022-07-03 NOTE — BH Assessment (Addendum)
Referral information for [facility] psychiatric Hospitalization faxed to;   [facility] [phone])  [facility] [phone]- [phone]),   [facility] [phone]) [phone]  [facility]([phone], Per LMickel Baas all units are full at this time.  [address][phone],   [phone],   [phone]  MAdela Ports([phone]

## 2022-07-03 NOTE — ED Notes (Signed)
Pt has urinated on self, sat up and took medications with ice water. New sheets, blankets, pants and other needs provided for pt

## 2022-07-03 NOTE — ED Notes (Signed)
Report received from [place], Conservation officer, nature. Patient alert and oriented, warm and dry, and in no acute distress. Patient does not speak and only shakes his head at times. Patient made aware of Q15 minute rounds and Engineer, drilling presence for their safety. Patient instructed to come to this nurse with needs or concerns.

## 2022-07-03 NOTE — Consult Note (Signed)
[facility] Consult   Reason for Consult:  anxiety, not functioning, confuse Referring Physician:  EDP Patient Identification: John [name] MRN:  [id number] Principal Diagnosis: Severe recurrent major depression without psychotic features ([place]) Diagnosis:  Principal Problem:   Severe recurrent major depression without psychotic features ([place])   Total Time spent with patient: 45 minutes  [name] is a 81 y.o. male patient admitted with confusion, anxiety.  HPI:  81 yo male who recently discharged from the hospital after an extended stay for depression and anxiety.  He has had many PCP and psychiatry calls since discharge related to his mood.  He has progressively decompensated and is currently confused and nonverbal.  Very anxious on assessment with attempts to talk with no success. His wife is at his bedside providing information along with chart review.  She wants him admitted to [facility] so she can see him regularly.  His appetite and sleep have been good per wife. She noted he was confused and concerned about his medicines to the point of stopping them.  Based on his presentation, a higher level of care may be needed to properly care for him.  For now, psych admission recommended for stabilization.  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 06/2015 CT Head: abnl thickening of mid-dist R cervical ICA w/o narrowing - ? vasculitis and thrombosed/healing dissection; b. 01/2021 Carotid U/S: 1-39% bilat ICA stenoses.   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 Age 54 - struck in head w/ baseball --> s/p craniotomy.   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 Approx 2015 - eval in [place] - conservatively managed.    Past Surgical History:  Procedure Laterality Date   ANKLE ARTHROSCOPY Left    fracture   CHOLECYSTECTOMY  2007   [place]   left frontal craniotomy for epidural hematoma   HIP ARTHROPLASTY Right 04/07/2022   Procedure: ARTHROPLASTY BIPOLAR HIP (HEMIARTHROPLASTY);  Surgeon: Earnestine Leys, MD;  Location: [facility] ORS;  Service: Orthopedics;  Laterality: Right;   INSERTION OF MESH  01/02/2022   Procedure: INSERTION OF MESH;  Surgeon: Herbert Pun, MD;  Location: [facility] ORS;  Service: General;;   LIGAMENT REPAIR Right    thumb   RETINAL DETACHMENT SURGERY     TREATMENT FISTULA ANAL  2006   Family History:  Family History  Problem Relation Age of Onset   Osteoporosis Mother    Depression Mother    Prostate cancer Father    Hypertension Father    Diabetes Brother    Kidney disease Neg Hx    Family Psychiatric  History: see above  Social History:  Social History   Substance and Sexual Activity  Alcohol Use Not Currently     Social History   Substance and Sexual Activity  Drug Use No    Social History   Socioeconomic History   Marital status: Married    Spouse name: [name]   Number of children: 2   Years of education: Not on file   Highest education level: Master's degree (e.g., MA, MS, MEng, MEd, MSW, MBA)  Occupational History   Not on file  Tobacco Use   Smoking status: Never   Smokeless tobacco: Never  Vaping Use   Vaping Use: Never used  Substance and Sexual Activity   Alcohol use: Not Currently   Drug use: No   Sexual activity: Yes    Birth  control/protection: None  Other Topics Concern   Not on file  Social History Narrative   Live at home with wife.    Social Determinants of Health   Financial Resource Strain: Not on file  Food Insecurity: Not on file  Transportation Needs: Not on file  Physical Activity: Not on file  Stress: Not on file  Social Connections: Not on file   Additional Social History:    Allergies:  No Known Allergies  Labs:  Results for orders placed or performed during the hospital encounter of 07/03/22 (from the past 48 hour(s))  SARS Coronavirus 2 by RT PCR (hospital order, performed in [facility] hospital lab) *cepheid single result test* Anterior Nasal Swab     Status: None   Collection Time: 07/03/22  2:00 PM   Specimen: Anterior Nasal Swab  Result Value Ref Range   SARS Coronavirus 2 by RT PCR NEGATIVE NEGATIVE    Comment: (NOTE) SARS-CoV-2 target nucleic acids are NOT DETECTED.  The SARS-CoV-2 RNA is generally detectable in upper and lower respiratory specimens during the acute phase of infection. The lowest concentration of SARS-CoV-2 viral copies this assay can detect is 250 copies / mL. A negative result does not preclude SARS-CoV-2 infection and should not be used as the sole basis for treatment or other patient management decisions.  A negative result may occur with improper specimen collection / handling, submission of specimen other than nasopharyngeal swab, presence of viral mutation(s) within the areas targeted by this assay, and inadequate number of viral copies (<250 copies / mL). A negative result must be combined with clinical observations, patient history, and epidemiological information.  Fact Sheet for Patients:   https://www.patel.info/  Fact Sheet for Healthcare Providers: https://hall.com/  This test is not yet approved or  cleared by the [place] FDA and has been authorized for detection and/or diagnosis of  SARS-CoV-2 by FDA under an Emergency Use Authorization (EUA).  This EUA will remain in effect (meaning this test can be used) for the duration of the COVID-19 declaration under Section 564(b)(1) of the Act, 21 U.S.C. section 360bbb-3(b)(1), unless the authorization is terminated or revoked sooner.  Performed at [facility], [address] [place], [place] [address]     No current facility-administered medications for this encounter.   Current Outpatient Medications  Medication Sig Dispense Refill   aspirin EC 81 MG tablet Take 81 mg by mouth daily.     buPROPion (WELLBUTRIN XL) 300 MG 24 hr tablet Take 1 tablet by mouth daily.     docusate sodium (COLACE) 100 MG capsule Take 1 capsule (100 mg total) by mouth 2 (two) times daily. 10 capsule 0   methocarbamol (ROBAXIN) 500 MG  tablet Take 1 tablet (500 mg total) by mouth every 6 (six) hours as needed for muscle spasms. 30 tablet 0   metoprolol tartrate (LOPRESSOR) 25 MG tablet Take 1 tablet (25 mg total) by mouth 2 (two) times daily. 60 tablet 3   polyethylene glycol (MIRALAX / GLYCOLAX) 17 g packet Take 17 g by mouth daily as needed. 14 each 0   senna (SENOKOT) 8.6 MG TABS tablet Take 1 tablet (8.6 mg total) by mouth 2 (two) times daily. 120 tablet 0   sertraline (ZOLOFT) 50 MG tablet Take 1 tablet (50 mg total) by mouth at bedtime. 30 tablet 0   sertraline (ZOLOFT) 50 MG tablet Take 1 tablet (50 mg total) by mouth daily. 30 tablet 1   tamsulosin (FLOMAX) 0.4 MG CAPS capsule Take 1 capsule (0.4 mg total) by mouth daily after supper. 30 capsule 3   traZODone (DESYREL) 50 MG tablet Take 1 tablet (50 mg total) by mouth at bedtime as needed for sleep. 30 tablet 3    Musculoskeletal: Strength & Muscle Tone: decreased Gait & Station:  did not witness Patient leans: N/A Psychiatric Specialty Exam: Physical Exam Vitals and nursing note reviewed.  Constitutional:      Appearance: Normal appearance.  HENT:     Head:  Normocephalic.     Nose: Nose normal.  Pulmonary:     Effort: Pulmonary effort is normal.  Musculoskeletal:     Cervical back: Normal range of motion.  Neurological:     General: No focal deficit present.     Mental Status: He is alert.  Psychiatric:        Attention and Perception: Attention and perception normal.        Mood and Affect: Mood is anxious and depressed.        Speech: He is noncommunicative.        Behavior: Behavior is slowed.        Thought Content: Thought content normal.        Cognition and Memory: Cognition is impaired. Memory is impaired.        Judgment: Judgment normal.     Review of Systems  Psychiatric/Behavioral:  Positive for depression. The patient is nervous/anxious.   All other systems reviewed and are negative.   Blood pressure 135/82, pulse 72, temperature 97.9 F (36.6 C), temperature source Oral, resp. rate 16, height '6\' 2"'$  (1.88 m), weight 77 kg, SpO2 97 %.Body mass index is 21.8 kg/m.  General Appearance: Casual  Eye Contact:  Fair  Speech:  Negative  Volume:  Negative  Mood:  Anxious and Depressed  Affect:  Congruent  Thought Process:  UTA, minimal words to no words  Orientation:  Other:  person  Thought Content:  UTA  Suicidal Thoughts:  No  Homicidal Thoughts:  No  Memory:  UTA  Judgement:  Impaired  Insight:  UTA  Psychomotor Activity:  Decreased  Concentration:  UTA  Recall:  uTA  Fund of Knowledge:  UTA  Language:  Poor  Akathisia:  No  Handed:  Right  AIMS (if indicated):     Assets:  Housing Leisure Time Resilience Social Support  ADL's:  Impaired  Cognition:  Impaired,  Mild  Sleep:        Physical Exam: Physical Exam Vitals and nursing note reviewed.  Constitutional:      Appearance: Normal appearance.  HENT:     Head: Normocephalic.     Nose: Nose normal.  Pulmonary:     Effort: Pulmonary  effort is normal.  Musculoskeletal:     Cervical back: Normal range of motion.  Neurological:     General: No  focal deficit present.     Mental Status: He is alert.  Psychiatric:        Attention and Perception: Attention and perception normal.        Mood and Affect: Mood is anxious and depressed.        Speech: He is noncommunicative.        Behavior: Behavior is slowed.        Thought Content: Thought content normal.        Cognition and Memory: Cognition is impaired. Memory is impaired.        Judgment: Judgment normal.    Review of Systems  Psychiatric/Behavioral:  Positive for depression. The patient is nervous/anxious.   All other systems reviewed and are negative.  Blood pressure 135/82, pulse 72, temperature 97.9 F (36.6 C), temperature source Oral, resp. rate 16, height '6\' 2"'$  (1.88 m), weight 77 kg, SpO2 97 %. Body mass index is 21.8 kg/m.  Treatment Plan Summary: Daily contact with patient to assess and evaluate symptoms and progress in treatment, Medication management, and Plan : Major depression, recurrent, severe without psychosis: Wellbutrin 300 mg daily Zoloft 50 mg daily  Anxiety: Hydroxyzine discontinued related to confusion Ativan 0.5 mg BID PRN  Insomnia: Trazodone 50 mg daily at bedtime  Disposition: Recommend psychiatric Inpatient admission when medically cleared.  Waylan Boga, NP 07/03/2022 4:54 PM

## 2022-07-03 NOTE — ED Notes (Signed)
VOl geri psych admit

## 2022-07-03 NOTE — ED Triage Notes (Signed)
Pt here from [facility] with wife, was seen here yesterday for same, left without seeing MD. Pt has been struggling with anxiety and depression since June. Has now become essentially non verbal. Pt rocking back and forth in wheelchair. Shakes "no" head when asked if he is in pain. Per wife pt is prescribed meds for same and has been taking them like hes supposed to.

## 2022-07-03 NOTE — ED Provider Notes (Signed)
[facility] Provider Note    Event Date/Time   First MD Initiated Contact with Patient 07/03/22 1024     (approximate)  History   Chief Complaint: Anxiety and Depression  HPI  John John [name] is a 81 y.o. male with a past medical history depression, diabetes, hypertension, presents to the emergency department for worsening depression and anxiety.  Most of the history was given by the wife as the patient largely states they are nonverbal.  Wife states this is how the patient gets when his depression and anxiety get very bad.  Wife states the patient has been taking all of his home medications as prescribed but he is worsening and not improving so they came to the emergency department for evaluation.  They came yesterday but due to a long wait for psychiatry they ended up leaving before being seen.  John [name] medical complaints today.  Physical Exam   Triage Vital Signs: ED Triage Vitals  Enc Vitals Group     BP 07/03/22 0904 135/82     Pulse Rate 07/03/22 0904 72     Resp 07/03/22 0904 16     Temp 07/03/22 0904 97.9 F (36.6 C)     Temp Source 07/03/22 0904 Oral     SpO2 07/03/22 0904 97 %     Weight 07/03/22 0905 169 lb 12.1 oz (77 kg)     Height 07/03/22 0905 '6\' 2"'$  (1.88 m)     Head Circumference --      Peak Flow --      Pain Score 07/03/22 0905 0     Pain Loc --      Pain Edu? --      Excl. in [place]? --     Most recent vital signs: Vitals:   07/03/22 0904  BP: 135/82  Pulse: 72  Resp: 16  Temp: 97.9 F (36.6 C)  SpO2: 97%    General: Awake, patient appears anxious, mumbling at times but for the most part does not answer questions.  When pressed multiple times to answer question he will answer with a brief reply. CV:  Good peripheral perfusion.  Regular rate and rhythm  Resp:  Normal effort.  Equal breath sounds bilaterally.  Abd:  John [name] distention.  Soft, nontender.  John [name] rebound or guarding.   ED Results / Procedures / Treatments    MEDICATIONS  ORDERED IN ED: Medications - John [name] data to display   IMPRESSION / MDM / [place] / ED COURSE  I reviewed the triage vital signs and the nursing notes.  Patient's presentation is most consistent with acute presentation with potential threat to life or bodily function.  Patient presents emergency department for worsening depression and anxiety per wife.  Wife states patient has been hospitalized for similar episodes in the past and feels like he is to the point where he may need hospitalization once again.  Denies any medical complaints or concerns.  Patient does appear anxious in the emergency department.  We will attempt to have psychiatry and TTS evaluate the patient.    Patient had lab work performed yesterday that was largely normal.  Do not need to repeat check labs today besides a CBG.  Psychiatry has evaluated they will be admitting to their service once a bed is available.  FINAL CLINICAL IMPRESSION(S) / ED DIAGNOSES   Depression Anxiety   Note:  This document was prepared using Dragon voice recognition software and may include unintentional dictation errors.   Harvest Dark,  MD 07/03/22 1526

## 2022-07-04 ENCOUNTER — Encounter: Payer: Self-pay | Admitting: Psychiatry

## 2022-07-04 ENCOUNTER — Other Ambulatory Visit: Payer: Self-pay

## 2022-07-04 ENCOUNTER — Inpatient Hospital Stay
Admission: AD | Admit: 2022-07-04 | Discharge: 2022-07-31 | DRG: 885 | Disposition: A | Discharge status: Home / Self Care | Payer: Medicare Other | Source: Intra-hospital | Attending: Psychiatry | Admitting: Psychiatry

## 2022-07-04 DIAGNOSIS — Z8546 Personal history of malignant neoplasm of prostate: Secondary | ICD-10-CM

## 2022-07-04 DIAGNOSIS — Z20822 Contact with and (suspected) exposure to covid-19: Secondary | ICD-10-CM | POA: Diagnosis present

## 2022-07-04 DIAGNOSIS — E119 Type 2 diabetes mellitus without complications: Secondary | ICD-10-CM | POA: Diagnosis present

## 2022-07-04 DIAGNOSIS — L89312 Pressure ulcer of right buttock, stage 2: Secondary | ICD-10-CM | POA: Diagnosis not present

## 2022-07-04 DIAGNOSIS — Z818 Family history of other mental and behavioral disorders: Secondary | ICD-10-CM

## 2022-07-04 DIAGNOSIS — L8990 Pressure ulcer of unspecified site, unspecified stage: Secondary | ICD-10-CM | POA: Insufficient documentation

## 2022-07-04 DIAGNOSIS — R32 Unspecified urinary incontinence: Secondary | ICD-10-CM | POA: Diagnosis present

## 2022-07-04 DIAGNOSIS — Z833 Family history of diabetes mellitus: Secondary | ICD-10-CM

## 2022-07-04 DIAGNOSIS — Z23 Encounter for immunization: Secondary | ICD-10-CM | POA: Diagnosis present

## 2022-07-04 DIAGNOSIS — Z7982 Long term (current) use of aspirin: Secondary | ICD-10-CM

## 2022-07-04 DIAGNOSIS — Z96641 Presence of right artificial hip joint: Secondary | ICD-10-CM | POA: Diagnosis present

## 2022-07-04 DIAGNOSIS — F332 Major depressive disorder, recurrent severe without psychotic features: Secondary | ICD-10-CM | POA: Diagnosis present

## 2022-07-04 DIAGNOSIS — I10 Essential (primary) hypertension: Secondary | ICD-10-CM | POA: Diagnosis present

## 2022-07-04 DIAGNOSIS — Z9151 Personal history of suicidal behavior: Secondary | ICD-10-CM

## 2022-07-04 MED ORDER — METHOCARBAMOL 500 MG PO TABS: 500.0000 mg | ORAL_TABLET | Freq: Four times a day (QID) | ORAL | Status: DC | PRN

## 2022-07-04 MED ORDER — TAMSULOSIN HCL 0.4 MG PO CAPS
0.4000 mg | ORAL_CAPSULE | Freq: Every day | ORAL | Status: DC
  Administered 2022-07-04 – 2022-07-18 (×15): 0.4 mg via ORAL
  Filled 2022-07-04 (×17): qty 1

## 2022-07-04 MED ORDER — POLYETHYLENE GLYCOL 3350 17 G PO PACK: 17.0000 g | PACK | Freq: Every day | ORAL | Status: DC | PRN

## 2022-07-04 MED ORDER — METOPROLOL TARTRATE 25 MG PO TABS
25.0000 mg | ORAL_TABLET | Freq: Two times a day (BID) | ORAL | Status: DC
  Administered 2022-07-04 – 2022-07-07 (×7): 25 mg via ORAL
  Filled 2022-07-04 (×8): qty 1

## 2022-07-04 MED ORDER — ACETAMINOPHEN 325 MG PO TABS
650.0000 mg | ORAL_TABLET | Freq: Four times a day (QID) | ORAL | Status: DC | PRN
  Administered 2022-07-08 – 2022-07-20 (×9): 650 mg via ORAL
  Filled 2022-07-04 (×9): qty 2

## 2022-07-04 MED ORDER — DOCUSATE SODIUM 100 MG PO CAPS
100.0000 mg | ORAL_CAPSULE | Freq: Two times a day (BID) | ORAL | Status: DC
  Administered 2022-07-04 – 2022-07-31 (×53): 100 mg via ORAL
  Filled 2022-07-04 (×55): qty 1

## 2022-07-04 MED ORDER — ALUM & MAG HYDROXIDE-SIMETH 200-200-20 MG/5ML PO SUSP: 30.0000 mL | ORAL | Status: DC | PRN

## 2022-07-04 MED ORDER — BUPROPION HCL ER (XL) 300 MG PO TB24
300.0000 mg | ORAL_TABLET | Freq: Every day | ORAL | Status: DC
  Administered 2022-07-05 – 2022-07-31 (×27): 300 mg via ORAL
  Filled 2022-07-04 (×27): qty 1

## 2022-07-04 MED ORDER — SERTRALINE HCL 50 MG PO TABS
50.0000 mg | ORAL_TABLET | Freq: Every day | ORAL | Status: DC
  Administered 2022-07-04 – 2022-07-30 (×27): 50 mg via ORAL
  Filled 2022-07-04 (×27): qty 1

## 2022-07-04 MED ORDER — TRAZODONE HCL 50 MG PO TABS
50.0000 mg | ORAL_TABLET | Freq: Every evening | ORAL | Status: DC | PRN
  Administered 2022-07-06 – 2022-07-12 (×5): 50 mg via ORAL
  Filled 2022-07-04 (×5): qty 1

## 2022-07-04 MED ORDER — SENNA 8.6 MG PO TABS
1.0000 | ORAL_TABLET | Freq: Two times a day (BID) | ORAL | Status: DC
  Administered 2022-07-04 – 2022-07-31 (×53): 8.6 mg via ORAL
  Filled 2022-07-04 (×54): qty 1

## 2022-07-04 MED ORDER — MAGNESIUM HYDROXIDE 400 MG/5ML PO SUSP: 30.0000 mL | Freq: Every day | ORAL | Status: DC | PRN

## 2022-07-04 MED ORDER — ASPIRIN 81 MG PO TBEC
81.0000 mg | DELAYED_RELEASE_TABLET | Freq: Every day | ORAL | Status: DC
  Administered 2022-07-05 – 2022-07-31 (×27): 81 mg via ORAL
  Filled 2022-07-04 (×27): qty 1

## 2022-07-04 MED ORDER — INFLUENZA VAC A&B SA ADJ QUAD 0.5 ML IM PRSY
0.5000 mL | PREFILLED_SYRINGE | INTRAMUSCULAR | Status: AC
  Administered 2022-07-05: 0.5 mL via INTRAMUSCULAR
  Filled 2022-07-04: qty 0.5

## 2022-07-04 NOTE — Group Note (Addendum)
[facility] LCSW Group Therapy Note   Group Date: 07/04/2022 Start Time: 1300 End Time: 1350   Type of Therapy/Topic:  Group Therapy:  Balance in Life  Participation Level:  Did Not Attend   Description of Group:    This group will address the concept of balance and how it feels and looks when one is unbalanced. Patients will be encouraged to process areas in their lives that are out of balance, and identify reasons for remaining unbalanced. Facilitators will guide patients utilizing problem- solving interventions to address and correct the stressor making their life unbalanced. Understanding and applying boundaries will be explored and addressed for obtaining  and maintaining a balanced life. Patients will be encouraged to explore ways to assertively make their unbalanced needs known to significant others in their lives, using other group members and facilitator for support and feedback.  Therapeutic Goals: Patient will identify two or more emotions or situations they have that consume much of in their lives. Patient will identify signs/triggers that life has become out of balance:  Patient will identify two ways to set boundaries in order to achieve balance in their lives:  Patient will demonstrate ability to communicate their needs through discussion and/or role plays  Summary of Patient Progress: Patient was in admission with nurses during group.  Therapeutic Modalities:   Cognitive Behavioral Therapy Solution-Focused Therapy Assertiveness Training   Rozann Lesches, LCSW

## 2022-07-04 NOTE — ED Notes (Signed)
Wife visiting with patient.

## 2022-07-04 NOTE — Progress Notes (Signed)
Patient appears very anxious and preoccupied. Patient is rocking back and forth in exam room chair. He is able to state his full name and says that he is in the hospital. He says it is the year [birth year]. Patient is able to answer some questions, but answers with minimal answers and very slowly. He states that he is in the hospital for depression and his goal is to "get better". He rates his depression as a 3/10 and did not give an answer when asked to rate his anxiety. He currently denies SI, HI, AVH, and pain.  Patient states that he wears glasses and hearing aids but does not currently have them on. He denies smoking and drug use. He drinks alcohol occasionally but says it is less than one drink a week on average.   Skin assessment completed with Roland Rack, RN. Patient has three stage 1 pressure ulcers on the buttock area.  He has corns on his left foot and a blister on his right foot. No contraband found on patient.   Patient uses a cane at home and was given a rolling walker to use on the unit. His gait is slow and unsteady. Patient was able to ambulate independently from his room to the dayroom for dinner.

## 2022-07-04 NOTE — Consult Note (Signed)
[facility] Consult   Reason for Consult:  anxiety, not functioning, confuse Referring Physician:  EDP Patient Identification: John [name] MRN:  [id number] Principal Diagnosis: Severe recurrent major depression without psychotic features ([place]) Diagnosis:  Principal Problem:   Severe recurrent major depression without psychotic features ([place])   Total Time spent with patient: 45 minutes  [name] is a 81 y.o. male patient admitted with confusion, anxiety.  Today, he continues to be nonverbal and needing a one person assist to ambulate.  He does feed himself, incontinent episode last night.  [facility] psych admission being sought.  HPI on admission:  81 yo male who recently discharged from the hospital after an extended stay for depression and anxiety.  He has had many PCP and psychiatry calls since discharge related to his mood.  He has progressively decompensated and is currently confused and nonverbal.  Very anxious on assessment with attempts to talk with no success. His wife is at his bedside providing information along with chart review.  She wants him admitted to [facility] so she can see him regularly.  His appetite and sleep have been good per wife. She noted he was confused and concerned about his medicines to the point of stopping them.  Based on his presentation, a higher level of care may be needed to properly care for him.  For now, psych admission recommended for stabilization.  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 06/2015 CT Head: abnl thickening of mid-dist R cervical ICA w/o narrowing - ? vasculitis and thrombosed/healing  dissection; b. 01/2021 Carotid U/S: 1-39% bilat ICA stenoses.   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 Age 63 - struck in head w/ baseball --> s/p craniotomy.   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 Approx 2015 - eval in [place] - conservatively managed.    Past Surgical History:  Procedure Laterality Date   ANKLE ARTHROSCOPY Left    fracture   CHOLECYSTECTOMY  2007   [place]   left frontal craniotomy for epidural hematoma   HIP ARTHROPLASTY Right 04/07/2022   Procedure: ARTHROPLASTY BIPOLAR HIP (HEMIARTHROPLASTY);  Surgeon: Earnestine Leys, MD;  Location: [facility] ORS;  Service: Orthopedics;  Laterality: Right;   INSERTION OF MESH  01/02/2022   Procedure: INSERTION OF MESH;  Surgeon: Herbert Pun, MD;  Location: [facility] ORS;  Service: General;;   LIGAMENT REPAIR Right    thumb   RETINAL DETACHMENT SURGERY     TREATMENT FISTULA ANAL  2006   Family History:  Family History  Problem Relation Age of Onset   Osteoporosis Mother    Depression Mother  Prostate cancer Father    Hypertension Father    Diabetes Brother    Kidney disease Neg Hx    Family Psychiatric  History: see above Social History:  Social History   Substance and Sexual Activity  Alcohol Use Not Currently     Social History   Substance and Sexual Activity  Drug Use No    Social History   Socioeconomic History   Marital status: Married    Spouse name: [name]   Number of children: 2   Years of education: Not on file   Highest education level: Master's degree (e.g., MA, MS, MEng, MEd, MSW, MBA)  Occupational History   Not on file  Tobacco Use   Smoking  status: Never   Smokeless tobacco: Never  Vaping Use   Vaping Use: Never used  Substance and Sexual Activity   Alcohol use: Not Currently   Drug use: No   Sexual activity: Yes    Birth control/protection: None  Other Topics Concern   Not on file  Social History Narrative   Live at home with wife.    Social Determinants of Health   Financial Resource Strain: Not on file  Food Insecurity: Not on file  Transportation Needs: Not on file  Physical Activity: Not on file  Stress: Not on file  Social Connections: Not on file   Additional Social History:    Allergies:  No Known Allergies  Labs:  Results for orders placed or performed during the hospital encounter of 07/03/22 (from the past 48 hour(s))  Urinalysis, Complete w Microscopic     Status: Abnormal   Collection Time: 07/03/22 10:23 AM  Result Value Ref Range   Color, Urine YELLOW (A) YELLOW   APPearance TURBID (A) CLEAR   Specific Gravity, Urine 1.027 1.005 - 1.030   pH 5.0 5.0 - 8.0   Glucose, UA NEGATIVE NEGATIVE mg/dL   Hgb urine dipstick NEGATIVE NEGATIVE   Bilirubin Urine NEGATIVE NEGATIVE   Ketones, ur 20 (A) NEGATIVE mg/dL   Protein, ur NEGATIVE NEGATIVE mg/dL   Nitrite NEGATIVE NEGATIVE   Leukocytes,Ua NEGATIVE NEGATIVE   RBC / HPF 0-5 0 - 5 RBC/hpf   WBC, UA 0-5 0 - 5 WBC/hpf   Bacteria, UA RARE (A) NONE SEEN   Squamous Epithelial / LPF 0-5 0 - 5   Mucus PRESENT    Amorphous Crystal PRESENT    Ca Oxalate Crys, UA PRESENT     Comment: Performed at [facility], [address] [place], [place] [address]  SARS Coronavirus 2 by RT PCR (hospital order, performed in [place] hospital lab) *cepheid single result test* Anterior Nasal Swab     Status: None   Collection Time: 07/03/22  2:00 PM   Specimen: Anterior Nasal Swab  Result Value Ref Range   SARS Coronavirus 2 by RT PCR NEGATIVE NEGATIVE    Comment: (NOTE) SARS-CoV-2 target nucleic acids are NOT DETECTED.  The SARS-CoV-2 RNA is  generally detectable in upper and lower respiratory specimens during the acute phase of infection. The lowest concentration of SARS-CoV-2 viral copies this assay can detect is 250 copies / mL. A negative result does not preclude SARS-CoV-2 infection and should not be used as the sole basis for treatment or other patient management decisions.  A negative result may occur with improper specimen collection / handling, submission of specimen other than nasopharyngeal swab, presence of viral mutation(s) within the areas targeted by this assay, and inadequate number of viral copies (<250 copies /  mL). A negative result must be combined with clinical observations, patient history, and epidemiological information.  Fact Sheet for Patients:   https://www.patel.info/  Fact Sheet for Healthcare Providers: https://hall.com/  This test is not yet approved or  cleared by the [place] FDA and has been authorized for detection and/or diagnosis of SARS-CoV-2 by FDA under an Emergency Use Authorization (EUA).  This EUA will remain in effect (meaning this test can be used) for the duration of the COVID-19 declaration under Section 564(b)(1) of the Act, 21 U.S.C. section 360bbb-3(b)(1), unless the authorization is terminated or revoked sooner.  Performed at [facility], [address] [place], [place] [address]     Current Facility-Administered Medications  Medication Dose Route Frequency Provider Last Rate Last Admin   aspirin EC tablet 81 mg  81 mg Oral Daily Patrecia Pour, NP       buPROPion (WELLBUTRIN XL) 24 hr tablet 300 mg  300 mg Oral Daily Patrecia Pour, NP       docusate sodium (COLACE) capsule 100 mg  100 mg Oral BID Patrecia Pour, NP   100 mg at 07/03/22 2149   methocarbamol (ROBAXIN) tablet 500 mg  500 mg Oral Q6H PRN Patrecia Pour, NP       metoprolol tartrate (LOPRESSOR) tablet 25 mg  25 mg Oral BID Patrecia Pour, NP    25 mg at 07/03/22 2149   polyethylene glycol (MIRALAX / GLYCOLAX) packet 17 g  17 g Oral Daily PRN Patrecia Pour, NP       senna (SENOKOT) tablet 8.6 mg  1 tablet Oral BID Patrecia Pour, NP   8.6 mg at 07/03/22 2148   sertraline (ZOLOFT) tablet 50 mg  50 mg Oral QHS Patrecia Pour, NP   50 mg at 07/03/22 2149   tamsulosin (FLOMAX) capsule 0.4 mg  0.4 mg Oral QPC supper Patrecia Pour, NP       traZODone (DESYREL) tablet 50 mg  50 mg Oral QHS PRN Patrecia Pour, NP   50 mg at 07/03/22 2149   Current Outpatient Medications  Medication Sig Dispense Refill   aspirin EC 81 MG tablet Take 81 mg by mouth daily.     buPROPion (WELLBUTRIN XL) 300 MG 24 hr tablet Take 1 tablet by mouth daily.     docusate sodium (COLACE) 100 MG capsule Take 1 capsule (100 mg total) by mouth 2 (two) times daily. 10 capsule 0   methocarbamol (ROBAXIN) 500 MG tablet Take 1 tablet (500 mg total) by mouth every 6 (six) hours as needed for muscle spasms. 30 tablet 0   metoprolol tartrate (LOPRESSOR) 25 MG tablet Take 1 tablet (25 mg total) by mouth 2 (two) times daily. 60 tablet 3   polyethylene glycol (MIRALAX / GLYCOLAX) 17 g packet Take 17 g by mouth daily as needed. 14 each 0   senna (SENOKOT) 8.6 MG TABS tablet Take 1 tablet (8.6 mg total) by mouth 2 (two) times daily. 120 tablet 0   sertraline (ZOLOFT) 50 MG tablet Take 1 tablet (50 mg total) by mouth at bedtime. 30 tablet 0   sertraline (ZOLOFT) 50 MG tablet Take 1 tablet (50 mg total) by mouth daily. 30 tablet 1   tamsulosin (FLOMAX) 0.4 MG CAPS capsule Take 1 capsule (0.4 mg total) by mouth daily after supper. 30 capsule 3   traZODone (DESYREL) 50 MG tablet Take 1 tablet (50 mg total) by mouth at bedtime as needed for  sleep. 30 tablet 3    Musculoskeletal: Strength & Muscle Tone: decreased Gait & Station:  did not witness Patient leans: N/A Psychiatric Specialty Exam: Physical Exam Vitals and nursing note reviewed.  Constitutional:      Appearance:  Normal appearance.  HENT:     Head: Normocephalic.     Nose: Nose normal.  Pulmonary:     Effort: Pulmonary effort is normal.  Musculoskeletal:     Cervical back: Normal range of motion.  Neurological:     General: No focal deficit present.     Mental Status: He is alert.  Psychiatric:        Attention and Perception: Attention and perception normal.        Mood and Affect: Mood is anxious and depressed.        Speech: He is noncommunicative.        Behavior: Behavior is slowed.        Thought Content: Thought content normal.        Cognition and Memory: Cognition is impaired. Memory is impaired.        Judgment: Judgment normal.     Review of Systems  Psychiatric/Behavioral:  Positive for depression. The patient is nervous/anxious.   All other systems reviewed and are negative.   Blood pressure (!) 163/71, pulse 82, temperature (!) 97.4 F (36.3 C), temperature source Oral, resp. rate 18, height '6\' 2"'$  (1.88 m), weight 77 kg, SpO2 98 %.Body mass index is 21.8 kg/m.  General Appearance: Casual  Eye Contact:  Fair  Speech:  Negative  Volume:  Negative  Mood:  Anxious and Depressed  Affect:  Congruent  Thought Process:  UTA, minimal words to no words  Orientation:  Other:  person  Thought Content:  UTA  Suicidal Thoughts:  No  Homicidal Thoughts:  No  Memory:  UTA  Judgement:  Impaired  Insight:  UTA  Psychomotor Activity:  Decreased  Concentration:  UTA  Recall:  uTA  Fund of Knowledge:  UTA  Language:  Poor  Akathisia:  No  Handed:  Right  AIMS (if indicated):     Assets:  Housing Leisure Time Resilience Social Support  ADL's:  Impaired  Cognition:  Impaired,  Mild  Sleep:        Physical Exam: Physical Exam Vitals and nursing note reviewed.  Constitutional:      Appearance: Normal appearance.  HENT:     Head: Normocephalic.     Nose: Nose normal.  Pulmonary:     Effort: Pulmonary effort is normal.  Musculoskeletal:     Cervical back: Normal range  of motion.  Neurological:     General: No focal deficit present.     Mental Status: He is alert.  Psychiatric:        Attention and Perception: Attention and perception normal.        Mood and Affect: Mood is anxious and depressed.        Speech: He is noncommunicative.        Behavior: Behavior is slowed.        Thought Content: Thought content normal.        Cognition and Memory: Cognition is impaired. Memory is impaired.        Judgment: Judgment normal.    Review of Systems  Psychiatric/Behavioral:  Positive for depression. The patient is nervous/anxious.   All other systems reviewed and are negative.  Blood pressure (!) 163/71, pulse 82, temperature (!) 97.4 F (36.3 C), temperature source  Oral, resp. rate 18, height '6\' 2"'$  (1.88 m), weight 77 kg, SpO2 98 %. Body mass index is 21.8 kg/m.  Treatment Plan Summary: Daily contact with patient to assess and evaluate symptoms and progress in treatment, Medication management, and Plan : Major depression, recurrent, severe without psychosis: Wellbutrin 300 mg daily Zoloft 50 mg daily  Anxiety: Hydroxyzine discontinued related to confusion Ativan 0.5 mg BID PRN  Insomnia: Trazodone 50 mg daily at bedtime  Disposition: Recommend psychiatric Inpatient admission when medically cleared.  Waylan Boga, NP 07/04/2022 9:55 AM

## 2022-07-04 NOTE — ED Provider Notes (Signed)
Emergency Medicine Observation Re-evaluation Note  John [name] is a 81 y.o. male, seen on rounds today.  Pt initially presented to the ED for complaints of Anxiety and Depression  Currently, the patient is is no acute distress. Denies any concerns at this time.  Physical Exam  Blood pressure (!) 144/94, pulse 72, temperature 98.3 F (36.8 C), temperature source Axillary, resp. rate 16, height '6\' 2"'$  (1.88 m), weight 77 kg, SpO2 99 %.  Physical Exam: General: No apparent distress Pulm: Normal WOB Neuro: Moving all extremities Psych: Resting comfortably     ED Course / MDM     I have reviewed the labs performed to date as well as medications administered while in observation.  Recent changes in the last 24 hours include: No acute events overnight.  Plan   Current plan: Patient awaiting psychiatric disposition.  Psychiatry recommends inpatient Geri psychiatric placement. Patient is not under full IVC at this time.    John [name], John Bison, DO 07/04/22 [phone]

## 2022-07-04 NOTE — Plan of Care (Signed)
Pt appears anxious/depressed. He forwards little to no information. Pt is unsteady on his feet even with front wheel walker. Pt is incontinent and attempts to leave his walker multiple times on the way back from the bathroom. A bed alarm has been turned on, on the pt bed d/t his impulsiveness to get out of bed w/o assistance and leaving his walker behind. Pt denies SI/HI/AVH at this time.  Problem: Coping: Goal: Level of anxiety will decrease Outcome: Not Progressing   Problem: Education: Goal: Mental status will improve Outcome: Not Progressing

## 2022-07-04 NOTE — ED Notes (Signed)
Pts linen and clothes changed by this tech and Radonna Ricker, RN at this time.

## 2022-07-04 NOTE — ED Notes (Signed)
Patient got lunch tray and drink.

## 2022-07-04 NOTE — Tx Team (Signed)
Initial Treatment Plan 07/04/2022 5:26 PM [name] WVP:[id number]   PATIENT STRESSORS: Marital or family conflict   Medication change or noncompliance     PATIENT STRENGTHS: Financial means  Supportive family/friends    PATIENT IDENTIFIED PROBLEMS: Depression                     DISCHARGE CRITERIA:  Ability to meet basic life and health needs Adequate post-discharge living arrangements Improved stabilization in mood, thinking, and/or behavior  PRELIMINARY DISCHARGE PLAN: Return to previous living arrangement  PATIENT/FAMILY INVOLVEMENT: This treatment plan has been presented to and reviewed with the patient, Mitchelle Goerner.  The patient has been given the opportunity to ask questions and make suggestions.  Sunday Spillers, RN 07/04/2022, 5:26 PM

## 2022-07-04 NOTE — ED Notes (Signed)
Transported to geripsych with EDT via wheelchair. Left with 1 bag of belongings. Clean and dry on discharge.

## 2022-07-04 NOTE — ED Notes (Signed)
Breakfast and drink provided.

## 2022-07-05 DIAGNOSIS — F332 Major depressive disorder, recurrent severe without psychotic features: Secondary | ICD-10-CM

## 2022-07-05 LAB — GLUCOSE, CAPILLARY
Glucose-Capillary: 104 mg/dL — ABNORMAL HIGH (ref 70–99)
Glucose-Capillary: 133 mg/dL — ABNORMAL HIGH (ref 70–99)
Glucose-Capillary: 116 mg/dL — ABNORMAL HIGH (ref 70–99)
Glucose-Capillary: 115 mg/dL — ABNORMAL HIGH (ref 70–99)

## 2022-07-05 MED ORDER — ADULT MULTIVITAMIN W/MINERALS CH
1.0000 | ORAL_TABLET | Freq: Every day | ORAL | Status: DC
  Administered 2022-07-06 – 2022-07-31 (×26): 1 via ORAL
  Filled 2022-07-05 (×26): qty 1

## 2022-07-05 MED ORDER — ZINC OXIDE 40 % EX OINT
TOPICAL_OINTMENT | Freq: Two times a day (BID) | CUTANEOUS | Status: DC
  Administered 2022-07-05 – 2022-07-25 (×7): 1 via TOPICAL
  Filled 2022-07-05 (×4): qty 113

## 2022-07-05 MED ORDER — ENSURE MAX PROTEIN PO LIQD
11.0000 [oz_av] | Freq: Two times a day (BID) | ORAL | Status: DC
  Administered 2022-07-05: 474 mL via ORAL
  Administered 2022-07-06: 11 [oz_av] via ORAL
  Administered 2022-07-06 – 2022-07-07 (×2): 237 mL via ORAL
  Administered 2022-07-07 – 2022-07-13 (×12): 11 [oz_av] via ORAL
  Administered 2022-07-14: 8 [oz_av] via ORAL
  Administered 2022-07-14 – 2022-07-26 (×19): 11 [oz_av] via ORAL
  Administered 2022-07-26: 8 [oz_av] via ORAL
  Administered 2022-07-27 – 2022-07-28 (×3): 11 [oz_av] via ORAL
  Administered 2022-07-28: 237 mL via ORAL
  Administered 2022-07-29 – 2022-07-31 (×3): 11 [oz_av] via ORAL

## 2022-07-05 MED ORDER — ARIPIPRAZOLE 5 MG PO TABS
5.0000 mg | ORAL_TABLET | Freq: Every day | ORAL | Status: DC
  Administered 2022-07-05 – 2022-07-08 (×4): 5 mg via ORAL
  Filled 2022-07-05 (×4): qty 1

## 2022-07-05 MED ORDER — VITAMIN C 500 MG PO TABS
500.0000 mg | ORAL_TABLET | Freq: Two times a day (BID) | ORAL | Status: DC
  Administered 2022-07-05 – 2022-07-31 (×52): 500 mg via ORAL
  Filled 2022-07-05 (×52): qty 1

## 2022-07-05 MED ORDER — INSULIN ASPART 100 UNIT/ML IJ SOLN
0.0000 [IU] | Freq: Three times a day (TID) | INTRAMUSCULAR | Status: DC
  Administered 2022-07-06 – 2022-07-13 (×4): 1 [IU] via SUBCUTANEOUS
  Administered 2022-07-14: 2 [IU] via SUBCUTANEOUS
  Administered 2022-07-17 – 2022-07-21 (×3): 1 [IU] via SUBCUTANEOUS
  Administered 2022-07-22: 3 [IU] via SUBCUTANEOUS
  Administered 2022-07-24 – 2022-07-26 (×3): 1 [IU] via SUBCUTANEOUS
  Administered 2022-07-29: 2 [IU] via SUBCUTANEOUS
  Administered 2022-07-30: 1 [IU] via SUBCUTANEOUS
  Filled 2022-07-05 (×11): qty 1

## 2022-07-05 NOTE — Evaluation (Signed)
Physical Therapy Evaluation Patient Details Name: John [name] MRN: [id number] DOB: 18-Jun-1941 Today's Date: 07/05/2022  History of Present Illness  Pt is a 81 y/o M who was voluntarily admitted to geriatric psychiatry for worsening depression. Pt was d/c from the unit ~2 months ago & was noncompliatn with medications. PMH: CA, depression, DM2, epidural hematoma s/p craniotomy (age 12), Horner syndrome, HTN, migraine, obesity, sleep apnea  Clinical Impression  Pt seen for PT evaluation with pt agreeable to tx. Pt received in dayroom & is receptive to PT but initiates minimal verbal communication throughout session. Pt is able to complete STS with CGA<>supervision with extra time to power up & ambulates without AD with CGA. Pt demonstrates increased staggering/weight shifting to L with only CGA to correct. Pt demonstrates decreased step length & stride length. Pt would benefit from acute PT services to address balance, endurance & strengthening, gait & stairs with LRAD.     Recommendations for follow up therapy are one component of a multi-disciplinary discharge planning process, led by the attending physician.  Recommendations may be updated based on patient status, additional functional criteria and insurance authorization.  Follow Up Recommendations Home health PT      Assistance Recommended at Discharge Frequent or constant Supervision/Assistance  Patient can return home with the following  A little help with walking and/or transfers;A little help with bathing/dressing/bathroom;Assistance with cooking/housework;Direct supervision/assist for financial management;Assist for transportation;Help with stairs or ramp for entrance;Direct supervision/assist for medications management    Equipment Recommendations None recommended by PT  Recommendations for Other Services       Functional Status Assessment Patient has had a recent decline in their functional status and demonstrates the ability to make  significant improvements in function in a reasonable and predictable amount of time.     Precautions / Restrictions Precautions Precautions: Fall Restrictions Weight Bearing Restrictions: No      Mobility  Bed Mobility               General bed mobility comments: not observed, pt received & left sitting in recliner in dayroom    Transfers Overall transfer level: Needs assistance Equipment used: None Transfers: Sit to/from Stand Sit to Stand: Min guard, Supervision           General transfer comment: extra time to power up from recliner    Ambulation/Gait Ambulation/Gait assistance: Min guard Gait Distance (Feet): 125 Feet Assistive device: None   Gait velocity: decreased     General Gait Details: increased weight shift & staggering L, decreased weight shift to R, intermittent decreased step length BLE  Stairs            Wheelchair Mobility    Modified Rankin (Stroke Patients Only)       Balance Overall balance assessment: Needs assistance Sitting-balance support: No upper extremity supported, Feet supported Sitting balance-Leahy Scale: Good     Standing balance support: During functional activity, No upper extremity supported Standing balance-Leahy Scale: Fair                               Pertinent Vitals/Pain Pain Assessment Pain Assessment: Faces Faces Pain Scale: No hurt    Home Living Family/patient expects to be discharged to:: Private residence Living Arrangements: Spouse/significant other Available Help at Discharge: Family Type of Home: House Home Access: Stairs to enter Entrance Stairs-Rails: Can reach both;Right;Left Entrance Stairs-Number of Steps: 5   Home Layout: One level Home Equipment: Kasandra Knudsen -  single point Additional Comments: info per chart    Prior Function Prior Level of Function : Independent/Modified Independent             Mobility Comments: no AD prior to last admission, MOD I using RW  after 6/23 admission but in care everywhere MD notes say pt was walking with a cane       Hand Dominance        Extremity/Trunk Assessment   Upper Extremity Assessment Upper Extremity Assessment: Generalized weakness    Lower Extremity Assessment Lower Extremity Assessment: Generalized weakness       Communication   Communication: Expressive difficulties (minimal verbal communication attempts during session)  Cognition Arousal/Alertness: Awake/alert Behavior During Therapy: Flat affect Overall Cognitive Status: No family/caregiver present to determine baseline cognitive functioning                                 General Comments: Minimal efforts at initiating verbal conversation during session, doesn't answer all questions, does follow simple commands throughout.        General Comments      Exercises     Assessment/Plan    PT Assessment Patient needs continued PT services  PT Problem List Decreased strength;Decreased activity tolerance;Decreased balance;Decreased mobility;Decreased safety awareness       PT Treatment Interventions DME instruction;Therapeutic exercise;Gait training;Balance training;Stair training;Neuromuscular re-education;Functional mobility training;Therapeutic activities;Patient/family education;Cognitive remediation;Modalities    PT Goals (Current goals can be found in the Care Plan section)  Acute Rehab PT Goals Patient Stated Goal: none stated PT Goal Formulation: With patient Time For Goal Achievement: 07/19/22 Potential to Achieve Goals: Fair    Frequency Min 2X/week     Co-evaluation               AM-PAC PT "6 Clicks" Mobility  Outcome Measure Help needed turning from your back to your side while in a flat bed without using bedrails?: None Help needed moving from lying on your back to sitting on the side of a flat bed without using bedrails?: A Little Help needed moving to and from a bed to a chair (including  a wheelchair)?: A Little Help needed standing up from a chair using your arms (e.g., wheelchair or bedside chair)?: A Little Help needed to walk in hospital room?: A Little Help needed climbing 3-5 steps with a railing? : A Little 6 Click Score: 19    End of Session   Activity Tolerance: Patient tolerated treatment well Patient left: in chair (in dayroom with other pts & staff) Nurse Communication: Mobility status PT Visit Diagnosis: Unsteadiness on feet (R26.81);Muscle weakness (generalized) (M62.81)    Time: 3500-9381 PT Time Calculation (min) (ACUTE ONLY): 10 min   Charges:   PT Evaluation $PT Eval Moderate Complexity: [place], PT, DPT 07/05/22, 3:55 PM   Waunita Schooner 07/05/2022, 3:54 PM

## 2022-07-05 NOTE — Consult Note (Signed)
[facility] Nurse Consult Note: Reason for Consult: Consult requested for buttocks. Pt has red moist macerated skin to inner buttocks folds; appearance is consistent with moisture associated skin damage.  ICD-10 CM Codes for Irritant Dermatitis L24A2 - Due to fecal, urinary or dual incontinence  Right buttock with Stage 2 pressure injury; 4X3X.1cm, red and moist. Pressure Injury POA: Yes Dressing procedure/placement/frequency: Topical treatment orders provided for bedside nurses to perform as follows to protect and promote healing: Apply foam dressing Kellie Simmering # [id number]) to right buttock and change Q 3 days or PRN soiling. Desitin to inner buttocks BID. Please re-consult if further assistance is needed.  Thank-you,  Julien Girt MSN, [place], [place], [place], [place]

## 2022-07-05 NOTE — Evaluation (Signed)
Occupational Therapy Evaluation Patient Details Name: John [name] MRN: [id number] DOB: 1941/02/24 Today's Date: 07/05/2022   History of Present Illness John [name] is a 81 y.o. male with medical history significant of depression, anxiety, hypertension, diabetes mellitus, OSA, prostate cancer, Horner syndrome, dissection of carotid artery, subdural hematoma, epidural hematoma, who was found to have abnormal EKG with atrial fibrillation. Pt was voluntarily admitted to geriatric psychiatry for worsening depression.  He was discharged from psych unit approximately 2 months ago and it sounds like he has not been compliant with his medications   Clinical Impression   John [name] was seen for OT evaluation this date. Prior to hospital admission, pt was MOD I for mobility using RW, prior to recent admission pt did not use AD. Pt lives with spouse in home c 5 STE per chart. Pt presents to acute OT demonstrating impaired ADL performance and functional mobility 2/2 decreased activity tolerance and difficulty sequencing ADLs. Pt answers yes/no to some questions but follows all 1 step commands. Intermittent cues to sequence/initiate tasks.   Pt currently requires SBA + RW for funcitonal mobility ~100 ft. MIN A + RW for toilet t/f - assist for low commode height. SBA pericare in standing. MIN cues for sequencing hand washing standing at sink. Pt would benefit from skilled OT to address noted impairments and functional limitations (see below for any additional details). Upon hospital discharge, recommend HHOT to maximize pt safety and return to PLOF.   Recommendations for follow up therapy are one component of a multi-disciplinary discharge planning process, led by the attending physician.  Recommendations may be updated based on patient status, additional functional criteria and insurance authorization.   Follow Up Recommendations  Home health OT    Assistance Recommended at Discharge Frequent or constant  Supervision/Assistance  Patient can return home with the following Direct supervision/assist for medications management;Direct supervision/assist for financial management;Help with stairs or ramp for entrance    Functional Status Assessment  Patient has had a recent decline in their functional status and demonstrates the ability to make significant improvements in function in a reasonable and predictable amount of time.  Equipment Recommendations  None recommended by OT    Recommendations for Other Services       Precautions / Restrictions Precautions Precautions: Fall Restrictions Weight Bearing Restrictions: No      Mobility Bed Mobility               General bed mobility comments: NT    Transfers Overall transfer level: Needs assistance Equipment used: Rolling walker (2 wheels) Transfers: Sit to/from Stand Sit to Stand: Min assist           General transfer comment: CGA from chair height, MIN A from low toilet height      Balance Overall balance assessment: Needs assistance Sitting-balance support: No upper extremity supported, Feet supported Sitting balance-Leahy Scale: Good     Standing balance support: No upper extremity supported, During functional activity Standing balance-Leahy Scale: Fair                             ADL either performed or assessed with clinical judgement   ADL Overall ADL's : Needs assistance/impaired                                       General ADL Comments: SBA + RW for  funcitonal mobility ~100 ft. MIN A + RW for toilet t/f - assist for low commode height. SBA pericare in standing. MIN cues for sequencing hand washing standing at sink.      Pertinent Vitals/Pain Pain Assessment Pain Assessment: No/denies pain     Hand Dominance     Extremity/Trunk Assessment Upper Extremity Assessment Upper Extremity Assessment: Generalized weakness   Lower Extremity Assessment Lower Extremity  Assessment: Generalized weakness       Communication Communication Communication: Expressive difficulties   Cognition Arousal/Alertness: Awake/alert Behavior During Therapy: Flat affect Overall Cognitive Status: No family/caregiver present to determine baseline cognitive functioning                                 General Comments: pt answers yes/no to some questions. Follows all commands.                Home Living Family/patient expects to be discharged to:: Private residence Living Arrangements: Spouse/significant other Available Help at Discharge: Family Type of Home: House Home Access: Stairs to enter Technical brewer of Steps: 5 Entrance Stairs-Rails: Can reach both Home Layout: One level     Bathroom Shower/Tub: Teacher, early years/pre: Standard         Additional Comments: info per chart      Prior Functioning/Environment Prior Level of Function : Independent/Modified Independent             Mobility Comments: no AD prior to last admission, MOD I using RW after 6/23 admission          OT Problem List: Decreased strength;Decreased activity tolerance;Impaired balance (sitting and/or standing);Decreased safety awareness;Decreased cognition      OT Treatment/Interventions: Self-care/ADL training;Therapeutic exercise;Energy conservation;DME and/or AE instruction;Therapeutic activities;Patient/family education;Balance training;Cognitive remediation/compensation    OT Goals(Current goals can be found in the care plan section) Acute Rehab OT Goals Patient Stated Goal: pt does not state OT Goal Formulation: Patient unable to participate in goal setting Time For Goal Achievement: 07/19/22 Potential to Achieve Goals: Good ADL Goals Pt Will Perform Tub/Shower Transfer: Tub transfer;Independently;ambulating Additional ADL Goal #1: Pt will identify x3 preferred leisure tasks and initiate with MIN cues Additional ADL Goal #2:  Pt will complete medication mgmt assessment  OT Frequency: Min 2X/week    Co-evaluation              AM-PAC OT "6 Clicks" Daily Activity     Outcome Measure Help from another person eating meals?: None Help from another person taking care of personal grooming?: A Little Help from another person toileting, which includes using toliet, bedpan, or urinal?: A Little Help from another person bathing (including washing, rinsing, drying)?: A Little Help from another person to put on and taking off regular upper body clothing?: A Little Help from another person to put on and taking off regular lower body clothing?: A Little 6 Click Score: 19   End of Session Equipment Utilized During Treatment: Rolling walker (2 wheels)  Activity Tolerance: Patient tolerated treatment well Patient left: in chair (in day room)  OT Visit Diagnosis: Other abnormalities of gait and mobility (R26.89);Muscle weakness (generalized) (M62.81)                Time: 1455-1520 OT Time Calculation (min): 25 min Charges:  OT General Charges $OT Visit: 1 Visit OT Evaluation $OT Eval Moderate Complexity: 1 Mod OT Treatments $Self Care/Home Management : 8-22 mins  Dessie Coma,  M.S. OTR/L  07/05/22, 3:27 PM  ascom [phone]

## 2022-07-05 NOTE — Progress Notes (Signed)
Initial Nutrition Assessment  DOCUMENTATION CODES:   Not applicable  INTERVENTION:   Ensure Max protein supplement BID, each supplement provides 150kcal and 30g of protein.  MVI po daily   Vitamin C '500mg'$  po BID   NUTRITION DIAGNOSIS:   Increased nutrient needs related to cancer and cancer related treatments, wound healing as evidenced by estimated needs.  GOAL:   Patient will meet greater than or equal to 90% of their needs  MONITOR:   PO intake, Supplement acceptance  REASON FOR ASSESSMENT:   Malnutrition Screening Tool    ASSESSMENT:   81 y/o male with h/o MDD, OSA, prostate cancer, seizures, HTN, SDH, PAF, recent hip fracture and DM who is admitted with worsening depression.  Pt reports weight loss pta. Pt's wife reports patient has been eating well pta. Pt with good appetite and oral intake in hospital; pt eating 95-100% of meals. Per chart, pt is down 34lbs(18%) over the past several months; this is significant weight loss. RD will add supplements and vitamins to help pt meet his estimated needs and to support wound healing.   Medications reviewed and include: aspirin, colace, insulin, senokot  Labs reviewed:   Diet Order:   Diet Order             Diet Carb Modified Fluid consistency: Thin; Room service appropriate? Yes  Diet effective now                  EDUCATION NEEDS:   No education needs have been identified at this time  Skin:  Skin Assessment: Reviewed RN Assessment (Right buttock with Stage 2 pressure injury; 4X3X.1cm)  Last BM:  9/7  Height:   Ht Readings from Last 1 Encounters:  07/04/22 '6\' 2"'$  (1.88 m)    Weight:   Wt Readings from Last 1 Encounters:  07/04/22 70.8 kg   BMI:  Body mass index is 20.03 kg/m.  Estimated Nutritional Needs:   Kcal:  2100-2400kcal/day  Protein:  105-120g/day  Fluid:  1.9-2.2L/day  Koleen Distance MS, RD, LDN Please refer to [facility] for RD and/or RD on-call/weekend/after hours pager

## 2022-07-05 NOTE — Group Note (Signed)
LCSW Group Therapy Note   Group Date: 07/05/2022 Start Time: 1330 End Time: 1420   Type of Therapy and Topic:  Group Therapy: Challenging Core Beliefs  Participation Level:  None  Description of Group:  Patients were educated about core beliefs and asked to identify one harmful core belief that they have. Patients were asked to explore from where those beliefs originate. Patients were asked to discuss how those beliefs make them feel and the resulting behaviors of those beliefs. They were then be asked if those beliefs are true and, if so, what evidence they have to support them. Lastly, group members were challenged to replace those negative core beliefs with helpful beliefs.   Therapeutic Goals:   1. Patient will identify harmful core beliefs and explore the origins of such beliefs. 2. Patient will identify feelings and behaviors that result from those core beliefs. 3. Patient will discuss whether such beliefs are true. 4.  Patient will replace harmful core beliefs with helpful ones.  Summary of Patient Progress:  Patient actively engaged in processing and exploring how core beliefs are formed and how they impact thoughts, feelings, and behaviors. Patient proved open to input from peers and feedback from [place]. Patient demonstrated fair insight into the subject matter, was respectful and supportive of peers, and participated throughout the entire session.  Patient appeared attentive in the group activity and completed the task, however, was unable to complete the latter half of explaining his values.    Therapeutic Modalities: Cognitive Behavioral Therapy; Solution-Focused Therapy   Rozann Lesches, Latanya Presser 07/05/2022  4:17 PM

## 2022-07-05 NOTE — Plan of Care (Signed)
D- Patient alert and oriented. Patient presents with a flat and sad affect and mood. Denies SI, HI, AVH, and pain. Staff assess patient's mood throughout the shift. Patient responded to Probation officer when prompted and asked questions. Patient slow to respond to questions. Patient did not report any concerns to this Probation officer. Patient participated in group. Patient does not initiate interactions. Patient is not verbalizing feelings to Probation officer.    A- Scheduled medications administered to patient, per MD orders. Support and encouragement provided.  Routine safety checks conducted every 15 minutes.  Patient informed to notify staff with problems or concerns.  R- No adverse drug reactions noted. Patient contracts for safety at this time. Patient compliant with medications and treatment plan except for Glucerna. Patient receptive, calm, and cooperative. Patient does not interacts with others on the unit.  Patient remains safe at this time.    Problem: Coping: Goal: Ability to verbalize frustrations and anger appropriately will improve Outcome: Not Progressing   Problem: Education: Goal: Utilization of techniques to improve thought processes will improve Outcome: Not Progressing   Problem: Activity: Goal: Interest or engagement in leisure activities will improve Outcome: Not Progressing   Problem: Coping: Goal: Will verbalize feelings Outcome: Not Progressing

## 2022-07-05 NOTE — [facility] Suicide Risk Assessment (Signed)
[facility] Admission Suicide Risk Assessment   Nursing information obtained from:  Patient Demographic factors:  Male, Age 81 or older, Unemployed Current Mental Status:  NA Loss Factors:  NA Historical Factors:  NA Risk Reduction Factors:  Positive social support  Total Time spent with patient: 1 hour Principal Problem: Major depressive disorder, recurrent severe without psychotic features ([place]) Diagnosis:  Principal Problem:   Major depressive disorder, recurrent severe without psychotic features ([place])  Subjective Data:  81 yo male who recently discharged from the hospital after an extended stay for depression and anxiety.  He has had many PCP and psychiatry calls since discharge related to his mood.  He has progressively decompensated and is currently confused and nonverbal.  Very anxious on assessment with attempts to talk with no success. His wife is at his bedside providing information along with chart review.  She wants him admitted to [facility] so she can see him regularly.  His appetite and sleep have been good per wife. She noted he was confused and concerned about his medicines to the point of stopping them.  Based on his presentation, a higher level of care may be needed to properly care for him.  For now, psych admission recommended for stabilization.  Continued Clinical Symptoms:  Alcohol Use Disorder Identification Test Final Score (AUDIT): 1 The "Alcohol Use Disorders Identification Test", Guidelines for Use in Primary Care, Second Edition.  World Pharmacologist [facility]). Score between 0-7:  no or low risk or alcohol related problems. Score between 8-15:  moderate risk of alcohol related problems. Score between 16-19:  high risk of alcohol related problems. Score 20 or above:  warrants further diagnostic evaluation for alcohol dependence and treatment.   CLINICAL FACTORS:   Depression:   Anhedonia   Musculoskeletal: Strength & Muscle Tone: within normal limits Gait & Station:  unable to stand Patient leans: N/A  Psychiatric Specialty Exam:  Presentation  General Appearance: Appropriate for Environment  Eye Contact:Good  Speech:Clear and Coherent  Speech Volume:Normal  Handedness:Right   Mood and Affect  Mood:Anxious (stated "a little anxious")  Affect:Congruent   Thought Process  Thought Processes:Linear; Coherent; Goal Directed  Descriptions of Associations:Intact  Orientation:Full (Time, Place and Person)  Thought Content:Logical; WDL  History of Schizophrenia/Schizoaffective disorder:No  Duration of Psychotic Symptoms:No data recorded Hallucinations:No data recorded Ideas of Reference:None  Suicidal Thoughts:No data recorded Homicidal Thoughts:No data recorded  Sensorium  Memory:Immediate Good  Judgment:Good  Insight:Good   Executive Functions  Concentration:Good  Attention Span:Good  Recall:Good  Fund of Knowledge:Good  Language:Good   Psychomotor Activity  Psychomotor Activity:No data recorded  Assets  Assets:Housing; Catering manager; Desire for Improvement; Communication Skills; Resilience; Social Support   Sleep  Sleep:No data recorded    Blood pressure 128/83, pulse (!) 57, temperature 97.9 F (36.6 C), temperature source Oral, resp. rate 16, height '6\' 2"'$  (1.88 m), weight 70.8 kg, SpO2 99 %. Body mass index is 20.03 kg/m.   COGNITIVE FEATURES THAT CONTRIBUTE TO RISK:  Loss of executive function    SUICIDE RISK:   Minimal: No identifiable suicidal ideation.  Patients presenting with no risk factors but with morbid ruminations; may be classified as minimal risk based on the severity of the depressive symptoms  PLAN OF CARE: See orders  I certify that inpatient services furnished can reasonably be expected to improve the patient's condition.   [place], DO 07/05/2022, 11:25 AM

## 2022-07-05 NOTE — BH IP Treatment Plan (Signed)
Interdisciplinary Treatment and Diagnostic Plan Update  07/05/2022 Time of Session: 9:30AM Delvon Chipps MRN: [id number]  Principal Diagnosis: Major depressive disorder, recurrent severe without psychotic features ([place])  Secondary Diagnoses: Principal Problem:   Major depressive disorder, recurrent severe without psychotic features ([place])   Current Medications:  Current Facility-Administered Medications  Medication Dose Route Frequency Provider Last Rate Last Admin   acetaminophen (TYLENOL) tablet 650 mg  650 mg Oral Q6H PRN Patrecia Pour, NP       alum & mag hydroxide-simeth (MAALOX/MYLANTA) 200-200-20 MG/5ML suspension 30 mL  30 mL Oral Q4H PRN Patrecia Pour, NP       ARIPiprazole (ABILIFY) tablet 5 mg  5 mg Oral QPC breakfast Parks Ranger, DO   5 mg at 07/05/22 1146   ascorbic acid (VITAMIN C) tablet 500 mg  500 mg Oral BID Parks Ranger, DO       aspirin EC tablet 81 mg  81 mg Oral Daily Patrecia Pour, NP   81 mg at 07/05/22 0910   buPROPion (WELLBUTRIN XL) 24 hr tablet 300 mg  300 mg Oral Daily Patrecia Pour, NP   300 mg at 07/05/22 0910   docusate sodium (COLACE) capsule 100 mg  100 mg Oral BID Patrecia Pour, NP   100 mg at 07/05/22 0910   insulin aspart (novoLOG) injection 0-6 Units  0-6 Units Subcutaneous TID WC Parks Ranger, DO       liver oil-zinc oxide (DESITIN) 40 % ointment   Topical BID Parks Ranger, DO   1 Application at 86/38/17 1334   magnesium hydroxide (MILK OF MAGNESIA) suspension 30 mL  30 mL Oral Daily PRN Patrecia Pour, NP       methocarbamol (ROBAXIN) tablet 500 mg  500 mg Oral Q6H PRN Patrecia Pour, NP       metoprolol tartrate (LOPRESSOR) tablet 25 mg  25 mg Oral BID Patrecia Pour, NP   25 mg at 07/05/22 0910   [START ON 07/06/2022] multivitamin with minerals tablet 1 tablet  1 tablet Oral Daily Parks Ranger, DO       polyethylene glycol (MIRALAX / GLYCOLAX) packet 17 g  17 g Oral Daily PRN Patrecia Pour, NP       protein supplement (ENSURE MAX) liquid  11 oz Oral BID Parks Ranger, DO       senna (SENOKOT) tablet 8.6 mg  1 tablet Oral BID Patrecia Pour, NP   8.6 mg at 07/05/22 0910   sertraline (ZOLOFT) tablet 50 mg  50 mg Oral QHS Patrecia Pour, NP   50 mg at 07/04/22 2126   tamsulosin (FLOMAX) capsule 0.4 mg  0.4 mg Oral QPC supper Patrecia Pour, NP   0.4 mg at 07/04/22 1737   traZODone (DESYREL) tablet 50 mg  50 mg Oral QHS PRN Patrecia Pour, NP       PTA Medications: Medications Prior to Admission  Medication Sig Dispense Refill Last Dose   aspirin EC 81 MG tablet Take 81 mg by mouth daily.      buPROPion (WELLBUTRIN XL) 300 MG 24 hr tablet Take 1 tablet by mouth daily.      docusate sodium (COLACE) 100 MG capsule Take 1 capsule (100 mg total) by mouth 2 (two) times daily. 10 capsule 0    methocarbamol (ROBAXIN) 500 MG tablet Take 1 tablet (500 mg total) by mouth every 6 (six) hours as needed for muscle  spasms. 30 tablet 0    metoprolol tartrate (LOPRESSOR) 25 MG tablet Take 1 tablet (25 mg total) by mouth 2 (two) times daily. 60 tablet 3    polyethylene glycol (MIRALAX / GLYCOLAX) 17 g packet Take 17 g by mouth daily as needed. 14 each 0    senna (SENOKOT) 8.6 MG TABS tablet Take 1 tablet (8.6 mg total) by mouth 2 (two) times daily. 120 tablet 0    sertraline (ZOLOFT) 50 MG tablet Take 1 tablet (50 mg total) by mouth at bedtime. 30 tablet 0    sertraline (ZOLOFT) 50 MG tablet Take 1 tablet (50 mg total) by mouth daily. 30 tablet 1    tamsulosin (FLOMAX) 0.4 MG CAPS capsule Take 1 capsule (0.4 mg total) by mouth daily after supper. 30 capsule 3    traZODone (DESYREL) 50 MG tablet Take 1 tablet (50 mg total) by mouth at bedtime as needed for sleep. 30 tablet 3     Patient Stressors: Marital or family conflict   Medication change or noncompliance    Patient Strengths: Scientist, research (life sciences)  Supportive family/friends   Treatment Modalities: Medication Management,  Group therapy, Case management,  1 to 1 session with clinician, Psychoeducation, Recreational therapy.   Physician Treatment Plan for Primary Diagnosis: Major depressive disorder, recurrent severe without psychotic features ([place]) Long Term Goal(s): Improvement in symptoms so as ready for discharge   Short Term Goals: Ability to identify changes in lifestyle to reduce recurrence of condition will improve Ability to verbalize feelings will improve Ability to disclose and discuss suicidal ideas Ability to demonstrate self-control will improve Ability to identify and develop effective coping behaviors will improve Ability to maintain clinical measurements within normal limits will improve Compliance with prescribed medications will improve Ability to identify triggers associated with substance abuse/mental health issues will improve  Medication Management: Evaluate patient's response, side effects, and tolerance of medication regimen.  Therapeutic Interventions: 1 to 1 sessions, Unit Group sessions and Medication administration.  Evaluation of Outcomes: Not Met  Physician Treatment Plan for Secondary Diagnosis: Principal Problem:   Major depressive disorder, recurrent severe without psychotic features ([place])  Long Term Goal(s): Improvement in symptoms so as ready for discharge   Short Term Goals: Ability to identify changes in lifestyle to reduce recurrence of condition will improve Ability to verbalize feelings will improve Ability to disclose and discuss suicidal ideas Ability to demonstrate self-control will improve Ability to identify and develop effective coping behaviors will improve Ability to maintain clinical measurements within normal limits will improve Compliance with prescribed medications will improve Ability to identify triggers associated with substance abuse/mental health issues will improve     Medication Management: Evaluate patient's response, side effects, and  tolerance of medication regimen.  Therapeutic Interventions: 1 to 1 sessions, Unit Group sessions and Medication administration.  Evaluation of Outcomes: Not Met   RN Treatment Plan for Primary Diagnosis: Major depressive disorder, recurrent severe without psychotic features ([place]) Long Term Goal(s): Knowledge of disease and therapeutic regimen to maintain health will improve  Short Term Goals: Ability to demonstrate self-control, Ability to participate in decision making will improve, Ability to verbalize feelings will improve, Ability to disclose and discuss suicidal ideas, Ability to identify and develop effective coping behaviors will improve, and Compliance with prescribed medications will improve  Medication Management: RN will administer medications as ordered by provider, will assess and evaluate patient's response and provide education to patient for prescribed medication. RN will report any adverse and/or side effects to prescribing  provider.  Therapeutic Interventions: 1 on 1 counseling sessions, Psychoeducation, Medication administration, Evaluate responses to treatment, Monitor vital signs and CBGs as ordered, Perform/monitor CIWA, COWS, AIMS and Fall Risk screenings as ordered, Perform wound care treatments as ordered.  Evaluation of Outcomes: Not Met   LCSW Treatment Plan for Primary Diagnosis: Major depressive disorder, recurrent severe without psychotic features ([place]) Long Term Goal(s): Safe transition to appropriate next level of care at discharge, Engage patient in therapeutic group addressing interpersonal concerns.  Short Term Goals: Engage patient in aftercare planning with referrals and resources, Increase social support, Increase ability to appropriately verbalize feelings, Increase emotional regulation, Facilitate acceptance of mental health diagnosis and concerns, and Increase skills for wellness and recovery  Therapeutic Interventions: Assess for all discharge needs,  1 to 1 time with Social worker, Explore available resources and support systems, Assess for adequacy in community support network, Educate family and significant other(s) on suicide prevention, Complete Psychosocial Assessment, Interpersonal group therapy.  Evaluation of Outcomes: Not Met   Progress in Treatment: Attending groups: Yes. Participating in groups: Yes. Taking medication as prescribed: Yes. Toleration medication: Yes. Family/Significant other contact made: No, will contact:  once permission is given. Patient understands diagnosis: No. Discussing patient identified problems/goals with staff: Yes. Medical problems stabilized or resolved: No. Denies suicidal/homicidal ideation: Yes. Issues/concerns per patient self-inventory: No. Other: none  New problem(s) identified: No, Describe:  none  New Short Term/Long Term Goal(s):  medication management for mood stabilization; elimination of SI thoughts; development of comprehensive mental wellness plan.   Patient Goals:  "get better"  Discharge Plan or Barriers: CSW to assist patient in development of appropriate discharge plans. Patient struggled with speech during treatment team.   Reason for Continuation of Hospitalization: Anxiety Depression Medical Issues Medication stabilization  Estimated Length of Stay:  1-7 days  Last 3 [place] Suicide Severity Risk Score: [place] Admission (Current) from 07/04/2022 in [facility] from 07/03/2022 in [place] [facility] Visit from 06/11/2022 in confidential department  C-SSRS RISK CATEGORY No Risk No Risk Error: Q3, 4, or 5 should not be populated when Q2 is No       Last PHQ 2/9 Scores:    06/11/2022    2:20 PM 05/20/2022    1:11 PM 12/21/2021    8:59 AM  Depression screen PHQ 2/9  Decreased Interest 3 1 0  Down, Depressed, Hopeless 1 1 0  PHQ - 2 Score 4 2 0  Altered sleeping 1 1   Tired, decreased energy  1 2   Change in appetite 2 1   Feeling bad or failure about yourself  1 1   Trouble concentrating 2 2   Moving slowly or fidgety/restless 2 2   Suicidal thoughts 0 0   PHQ-9 Score 13 11   Difficult doing work/chores Somewhat difficult      Scribe for Treatment Team: Rozann Lesches, LCSW 07/05/2022 3:50 PM

## 2022-07-05 NOTE — H&P (Signed)
Psychiatric Admission Assessment Adult  Patient Identification: John [name] MRN:  [id number] Date of Evaluation:  07/05/2022 Chief Complaint:  Major depressive disorder, recurrent severe without psychotic features ([place]) [F33.2] Principal Diagnosis: Major depressive disorder, recurrent severe without psychotic features ([place]) Diagnosis:  Principal Problem:   Major depressive disorder, recurrent severe without psychotic features ([place])  History of Present Illness: John [name] is a 81 year old white male who was voluntarily admitted to geriatric psychiatry for worsening depression.  He was discharged from our unit approximately 2 months ago and it sounds like he has not been compliant with his medications.  He is basically nonverbal and is very slow to answer questions.  Most of the history was taken from his chart, notes, in the ER.  Last hospitalization he fell a lot and ended up breaking his hip which was repaired.  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  PER INITIAL INTAKE:  81 yo male who recently discharged from the hospital after an extended stay for depression and anxiety.  He has had many PCP and psychiatry calls since discharge related to his mood.  He has progressively decompensated and is currently confused and nonverbal.  Very anxious on assessment with attempts to talk with no success. His wife is at his bedside providing information along with chart review.  She wants him admitted to [facility] so she can see him regularly.  His appetite and sleep have been good per wife. She noted he was confused and concerned about his medicines to the point of stopping them.  Based on his presentation, a higher level of care may be needed to properly care for him.  For now, psych admission recommended for stabilization.  Associated Signs/Symptoms: Depression Symptoms:  depressed mood, Duration of Depression Symptoms: Greater than two weeks  (Hypo) Manic Symptoms:   None Anxiety Symptoms:  None Psychotic Symptoms:   None PTSD Symptoms: NA Total Time spent with patient: 1 hour  Past Psychiatric History:  Patient has a history of past depression repeated episodes throughout his life.  1 prior suicide attempt.  Was admitted to the hospital in mid May here with depression.  Was not showing significant improvement over time and was referred for ECT.  Patient received a few ECT treatments which generally appeared to be tolerated well.  This past weekend however he decompensated for unclear reasons at the very end of Sunday and prior to treatment on Monday was seen to be delirious with an arrhythmia.  Arrhythmia has stabilized.  Is the patient at risk to self? No.  Has the patient been a risk to self in the past 6 months? No.  Has the patient been a risk to self within the distant past? No.  Is the patient a risk to others? No.  Has the patient been a risk to others in the past 6 months? No.  Has the patient been a risk to others within the distant past? No.   [place] Scale:  [name] Admission (Current) from 07/04/2022 in [place] ED from 07/03/2022 in [place] Office Visit from 06/11/2022 in [place] No Risk No Risk Error: Q3, 4, or 5 should not be populated when Q2 is No        Prior Inpatient Therapy:   Prior Outpatient Therapy:    Alcohol Screening: 1. How often do you have a drink containing alcohol?: Monthly or less 2. How many drinks containing alcohol do you have on  a typical day when you are drinking?: 1 or 2 3. How often do you have six or more drinks on one occasion?: Never AUDIT-C Score: 1 4. How often during the last year have you found that you were not able to stop drinking once you had started?: Never 5. How often during the last year have you failed to do what was normally expected from you because of drinking?: Never 6. How often  during the last year have you needed a first drink in the morning to get yourself going after a heavy drinking session?: Never 7. How often during the last year have you had a feeling of guilt of remorse after drinking?: Never 8. How often during the last year have you been unable to remember what happened the night before because you had been drinking?: Never 9. Have you or someone else been injured as a result of your drinking?: No 10. Has a relative or friend or a doctor or another health worker been concerned about your drinking or suggested you cut down?: No Alcohol Use Disorder Identification Test Final Score (AUDIT): 1 Alcohol Brief Interventions/Follow-up: Patient Refused (not needed) Substance Abuse History in the last 12 months:  No. 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 06/2015 CT Head: abnl thickening of mid-dist R cervical ICA w/o narrowing - ? vasculitis and thrombosed/healing dissection; b. 01/2021 Carotid U/S: 1-39% bilat ICA stenoses.   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 Age 19 - struck in head w/ baseball --> s/p craniotomy.   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 Approx 2015 - eval in [place] - conservatively managed.    Past Surgical History:  Procedure Laterality Date   ANKLE ARTHROSCOPY Left    fracture   CHOLECYSTECTOMY  2007   [place]    left frontal craniotomy for epidural hematoma   HIP ARTHROPLASTY Right 04/07/2022   Procedure: ARTHROPLASTY BIPOLAR HIP (HEMIARTHROPLASTY);  Surgeon: Earnestine Leys, MD;  Location: [facility] ORS;  Service: Orthopedics;  Laterality: Right;   INSERTION OF MESH  01/02/2022   Procedure: INSERTION OF MESH;  Surgeon: Herbert Pun, MD;  Location: [facility] ORS;  Service: General;;   LIGAMENT REPAIR Right    thumb   RETINAL DETACHMENT SURGERY     TREATMENT FISTULA ANAL  2006   Family History:  Family History  Problem Relation Age of Onset   Osteoporosis Mother    Depression Mother    Prostate cancer Father    Hypertension Father    Diabetes Brother    Kidney disease Neg Hx    Family Psychiatric  History: Unremarkable Tobacco Screening:   Social History:  Social History   Substance and Sexual Activity  Alcohol Use  Not Currently     Social History   Substance and Sexual Activity  Drug Use No    Additional Social History:                           Allergies:  No Known Allergies Lab Results:  Results for orders placed or performed during the hospital encounter of 07/04/22 (from the past 48 hour(s))  Glucose, capillary     Status: Abnormal   Collection Time: 07/05/22  7:48 AM  Result Value Ref Range   Glucose-Capillary 104 (H) 70 - 99 mg/dL    Comment: Glucose reference range applies only to samples taken after fasting for at least 8 hours.    Blood Alcohol level:  Lab Results  Component Value Date   ETH <10 07/02/2022   ETH <10 84/69/6295    Metabolic Disorder Labs:  Lab Results  Component Value Date   HGBA1C 5.6 03/19/2022   MPG 114.02 03/19/2022   No results found for: "PROLACTIN" Lab Results  Component Value Date   CHOL 131 03/19/2022   TRIG 92 03/19/2022   HDL 33 (L) 03/19/2022   CHOLHDL 4.0 03/19/2022   VLDL 18 03/19/2022   LDLCALC 80 03/19/2022    Current Medications: Current Facility-Administered Medications  Medication Dose Route Frequency  Provider Last Rate Last Admin   acetaminophen (TYLENOL) tablet 650 mg  650 mg Oral Q6H PRN Patrecia Pour, NP       alum & mag hydroxide-simeth (MAALOX/MYLANTA) 200-200-20 MG/5ML suspension 30 mL  30 mL Oral Q4H PRN Patrecia Pour, NP       ARIPiprazole (ABILIFY) tablet 5 mg  5 mg Oral QPC breakfast Parks Ranger, DO       aspirin EC tablet 81 mg  81 mg Oral Daily Patrecia Pour, NP   81 mg at 07/05/22 0910   buPROPion (WELLBUTRIN XL) 24 hr tablet 300 mg  300 mg Oral Daily Patrecia Pour, NP   300 mg at 07/05/22 0910   docusate sodium (COLACE) capsule 100 mg  100 mg Oral BID Patrecia Pour, NP   100 mg at 07/05/22 0910   magnesium hydroxide (MILK OF MAGNESIA) suspension 30 mL  30 mL Oral Daily PRN Patrecia Pour, NP       methocarbamol (ROBAXIN) tablet 500 mg  500 mg Oral Q6H PRN Patrecia Pour, NP       metoprolol tartrate (LOPRESSOR) tablet 25 mg  25 mg Oral BID Patrecia Pour, NP   25 mg at 07/05/22 0910   polyethylene glycol (MIRALAX / GLYCOLAX) packet 17 g  17 g Oral Daily PRN Patrecia Pour, NP       senna (SENOKOT) tablet 8.6 mg  1 tablet Oral BID Patrecia Pour, NP   8.6 mg at 07/05/22 0910   sertraline (ZOLOFT) tablet 50 mg  50 mg Oral QHS Patrecia Pour, NP   50 mg at 07/04/22 2126   tamsulosin (FLOMAX) capsule 0.4 mg  0.4 mg Oral QPC supper Patrecia Pour, NP   0.4 mg at 07/04/22 1737   traZODone (DESYREL) tablet 50 mg  50 mg Oral QHS PRN Patrecia Pour, NP       PTA Medications: Medications Prior to Admission  Medication Sig Dispense Refill Last Dose   aspirin EC 81 MG tablet Take 81 mg by mouth daily.      buPROPion (WELLBUTRIN XL) 300 MG 24 hr tablet  Take 1 tablet by mouth daily.      docusate sodium (COLACE) 100 MG capsule Take 1 capsule (100 mg total) by mouth 2 (two) times daily. 10 capsule 0    methocarbamol (ROBAXIN) 500 MG tablet Take 1 tablet (500 mg total) by mouth every 6 (six) hours as needed for muscle spasms. 30 tablet 0    metoprolol tartrate  (LOPRESSOR) 25 MG tablet Take 1 tablet (25 mg total) by mouth 2 (two) times daily. 60 tablet 3    polyethylene glycol (MIRALAX / GLYCOLAX) 17 g packet Take 17 g by mouth daily as needed. 14 each 0    senna (SENOKOT) 8.6 MG TABS tablet Take 1 tablet (8.6 mg total) by mouth 2 (two) times daily. 120 tablet 0    sertraline (ZOLOFT) 50 MG tablet Take 1 tablet (50 mg total) by mouth at bedtime. 30 tablet 0    sertraline (ZOLOFT) 50 MG tablet Take 1 tablet (50 mg total) by mouth daily. 30 tablet 1    tamsulosin (FLOMAX) 0.4 MG CAPS capsule Take 1 capsule (0.4 mg total) by mouth daily after supper. 30 capsule 3    traZODone (DESYREL) 50 MG tablet Take 1 tablet (50 mg total) by mouth at bedtime as needed for sleep. 30 tablet 3     Musculoskeletal: Strength & Muscle Tone: within normal limits Gait & Station: unable to stand Patient leans: N/A            Psychiatric Specialty Exam:  Presentation  General Appearance: Appropriate for Environment  Eye Contact:Good  Speech:Clear and Coherent  Speech Volume:Normal  Handedness:Right   Mood and Affect  Mood:Anxious (stated "a little anxious")  Affect:Congruent   Thought Process  Thought Processes:Linear; Coherent; Goal Directed  Duration of Psychotic Symptoms: No data recorded Past Diagnosis of Schizophrenia or Psychoactive disorder: No  Descriptions of Associations:Intact  Orientation:Full (Time, Place and Person)  Thought Content:Logical; WDL  Hallucinations:No data recorded Ideas of Reference:None  Suicidal Thoughts:No data recorded Homicidal Thoughts:No data recorded  Sensorium  Memory:Immediate Good  Judgment:Good  Insight:Good   Executive Functions  Concentration:Good  Attention Span:Good  [place] of Knowledge:Good  Language:Good   Psychomotor Activity  Psychomotor Activity:No data recorded  Assets  Assets:Housing; Catering manager; Desire for Improvement; Communication  Skills; Resilience; Social Support   Sleep  Sleep:No data recorded   Physical Exam: Physical Exam Vitals and nursing note reviewed.  Constitutional:      Appearance: Normal appearance. He is normal weight.  HENT:     Head: Normocephalic and atraumatic.     Nose: Nose normal.     Mouth/Throat:     Pharynx: Oropharynx is clear.  Eyes:     Extraocular Movements: Extraocular movements intact.     Pupils: Pupils are equal, round, and reactive to light.  Cardiovascular:     Rate and Rhythm: Normal rate and regular rhythm.     Pulses: Normal pulses.     Heart sounds: Normal heart sounds.  Pulmonary:     Effort: Pulmonary effort is normal.     Breath sounds: Normal breath sounds.  Abdominal:     General: Abdomen is flat. Bowel sounds are normal.     Palpations: Abdomen is soft.  Musculoskeletal:        General: Normal range of motion.     Cervical back: Normal range of motion and neck supple.  Skin:    General: Skin is warm and dry.  Neurological:     General: No focal deficit  present.     Mental Status: He is alert and oriented to person, place, and time.  Psychiatric:        Attention and Perception: Attention and perception normal.        Mood and Affect: Mood is depressed. Affect is blunt and flat.        Speech: He is noncommunicative. Speech is delayed.        Behavior: Behavior is slowed.        Thought Content: Thought content normal.        Cognition and Memory: Cognition is impaired. Memory is impaired.        Judgment: Judgment is inappropriate.    Review of Systems  Constitutional: Negative.   HENT: Negative.    Eyes: Negative.   Respiratory: Negative.    Cardiovascular: Negative.   Gastrointestinal: Negative.   Genitourinary: Negative.   Musculoskeletal: Negative.   Skin: Negative.   Neurological: Negative.   Endo/Heme/Allergies: Negative.   Psychiatric/Behavioral:  Positive for depression.    Blood pressure 128/83, pulse (!) 57, temperature 97.9 F  (36.6 C), temperature source Oral, resp. rate 16, height '6\' 2"'$  (1.88 m), weight 70.8 kg, SpO2 99 %. Body mass index is 20.03 kg/m.  Treatment Plan Summary: Daily contact with patient to assess and evaluate symptoms and progress in treatment, Medication management, and Plan see orders  Observation Level/Precautions:  15 minute checks  Laboratory:  CBC Chemistry Profile  Psychotherapy:    Medications:    Consultations:    Discharge Concerns:    Estimated LOS:  Other:     Physician Treatment Plan for Primary Diagnosis: Major depressive disorder, recurrent severe without psychotic features ([place]) Long Term Goal(s): Improvement in symptoms so as ready for discharge  Short Term Goals: Ability to identify changes in lifestyle to reduce recurrence of condition will improve, Ability to verbalize feelings will improve, Ability to disclose and discuss suicidal ideas, Ability to demonstrate self-control will improve, Ability to identify and develop effective coping behaviors will improve, Ability to maintain clinical measurements within normal limits will improve, Compliance with prescribed medications will improve, and Ability to identify triggers associated with substance abuse/mental health issues will improve  Physician Treatment Plan for Secondary Diagnosis: Principal Problem:   Major depressive disorder, recurrent severe without psychotic features ([place])    I certify that inpatient services furnished can reasonably be expected to improve the patient's condition.    Parks Ranger, DO 9/8/202311:28 AM

## 2022-07-05 NOTE — B[facility] Counselor (Signed)
CSW attempted to complete assessment. Patient unable to engage to complete, even with use of previous assessment.  CSW team to attempt again at a later date.  Assunta Curtis, MSW, LCSW 07/05/2022 12:54 PM

## 2022-07-06 LAB — GLUCOSE, CAPILLARY
Glucose-Capillary: 121 mg/dL — ABNORMAL HIGH (ref 70–99)
Glucose-Capillary: 156 mg/dL — ABNORMAL HIGH (ref 70–99)
Glucose-Capillary: 122 mg/dL — ABNORMAL HIGH (ref 70–99)
Glucose-Capillary: 93 mg/dL (ref 70–99)

## 2022-07-06 NOTE — [facility] Counselor (Signed)
Adult Comprehensive Assessment  Patient ID: John [name], male   DOB: [birth year], 81 y.o.   MRN: [id number]  Information Source: Information source: Patient  Current Stressors:  Patient states their primary concerns and needs for treatment are:: "I've been isolating and depression has gotten worse" Patient states their goals for this hospitilization and ongoing recovery are:: "To work on my depression" Educational / Learning stressors: Denies stressor Employment / Job issues: Retired Family Relationships: Says some stress from his sister and daughter Museum/gallery curator / Lack of resources (include bankruptcy): Denies stressor Housing / Lack of housing: Denies stressor Physical health (include injuries & life threatening diseases): Diabetes, hernia surgery, prostate cancer hx; recently had hip replacement Social relationships: Denies stressor Substance abuse: Denies stressor Bereavement / Loss: Yes, friend passed away recently  Living/Environment/Situation:  Living Arrangements: Spouse/significant other Living conditions (as described by patient or guardian): Good Who else lives in the home?: Wife How long has patient lived in current situation?: 10 years What is atmosphere in current home: Comfortable  Family History:  Marital status: Married Number of Years Married: 75 What types of issues is patient dealing with in the relationship?: States he has been isolating and although they live together they seem to be sperate Additional relationship information: n/a Are you sexually active?: No What is your sexual orientation?: heterosexual Has your sexual activity been affected by drugs, alcohol, medication, or emotional stress?: na Does patient have children?: Yes How many children?: 2 How is patient's relationship with their children?: States his son lives in [place] and they speak occassionally. Daughter lives in [place] an they are closer  Childhood History:  By whom was/is the patient  raised?: Both parents Additional childhood history information: pt reports he had a postive childhood Description of patient's relationship with caregiver when they were a child: got along well with both parents Patient's description of current relationship with people who raised him/her: Both parents are deceased How were you disciplined when you got in trouble as a child/adolescent?: "fairly harsh" discipline, physical discipline Does patient have siblings?: Yes Number of Siblings: 3 Description of patient's current relationship with siblings: gets along well with remaining brother and sister Did patient suffer any verbal/emotional/physical/sexual abuse as a child?: No Did patient suffer from severe childhood neglect?: No Has patient ever been sexually abused/assaulted/raped as an adolescent or adult?: No Was the patient ever a victim of a crime or a disaster?: No Witnessed domestic violence?: No Has patient been affected by domestic violence as an adult?: No  Education:  Highest grade of school patient has completed: Masters in Estate manager/land agent Currently a Ship broker?: No Learning disability?: No  Employment/Work Situation:   Employment Situation: Retired Social research officer, government has Been Impacted by Current Illness: No What is the Longest Time Patient has Held a Job?: 48 years Where was the Patient Employed at that Time?: Production designer, theatre/television/film" Has Patient ever Been in the [employer]?: No  Financial Resources:   Museum/gallery curator resources: [employer] (Retirement) Does patient have a Programmer, applications or guardian?: No  Alcohol/Substance Abuse:   What has been your use of drugs/alcohol within the last 12 months?: Denies all use If attempted suicide, did drugs/alcohol play a role in this?: No Alcohol/Substance Abuse Treatment Hx: Denies past history Has alcohol/substance abuse ever caused legal problems?: No  Social Support System:   Pensions consultant Support System: Good Describe Community  Support System: Wife, sister, daughter, church Type of faith/religion: Hermelinda Medicus How does patient's faith help to cope with current illness?: Have not been as  active in the church as in the past.  Leisure/Recreation:   Do You Have Hobbies?: Yes Leisure and Hobbies: Naval architect, electronic projects, music, singing  Strengths/Needs:   What is the patient's perception of their strengths?: "Talking and being around people" Patient states they can use these personal strengths during their treatment to contribute to their recovery: Yes Patient states these barriers may affect/interfere with their treatment: None Patient states these barriers may affect their return to the community: None Other important information patient would like considered in planning for their treatment: None  Discharge Plan:   Currently receiving community mental health services: Yes (From Whom) Unice Bailey for therapy; [facility] for psychiatry) Patient states concerns and preferences for aftercare planning are: Continue with current providers Patient states they will know when they are safe and ready for discharge when: Yes, when feels less depressed Does patient have access to transportation?: Yes Does patient have financial barriers related to discharge medications?: No Patient description of barriers related to discharge medications: n/a Will patient be returning to same living situation after discharge?: Yes  Summary/Recommendations:   Summary and Recommendations (to be completed by the evaluator): [name] was admitted due to increased anxiety and depression, isolating. Pt has a hx of MDD. Recent stressors include relationship with sister and daughter, isolating, strained relationship with wife, physical health concerns and decline. Pt currently sees Unice Bailey for therapy and [facility] for psychiatry. While here, Kaiyden Simkin can benefit from crisis stabilization, medication management, therapeutic  milieu, and referrals for services.  Eulas Schweitzer A Daveda Larock. 07/06/2022

## 2022-07-06 NOTE — Progress Notes (Signed)
[facility] MD Progress Note  07/06/2022 1:12 PM John [name]  MRN:  [id number] Subjective:  Pt presents with very depressed mood, soft speech with noted thought blocking and delayed responses. Patient stares for long periods of time before being able to answer and often times does not answer at all, simply staring confused. Patient denied any SI or HI with writer, and denies any A/V Hallucinations, however appears to be confused, pained and pensive/worried/fearful in much of his appearance. Patient per staff report requires assistance for adl's and is unable to ambulate on his own. Patient is able to eat and feed himself and ate well this am.  Principal Problem: Major depressive disorder, recurrent severe without psychotic features ([place]) Diagnosis: Principal Problem:   Major depressive disorder, recurrent severe without psychotic features (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 06/2015 CT Head: abnl thickening of mid-dist R cervical ICA w/o narrowing - ? vasculitis and thrombosed/healing dissection; b. 01/2021 Carotid U/S: 1-39% bilat ICA stenoses.   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 Age 63 - struck in head w/ baseball --> s/p craniotomy.   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 Approx 2015 - eval in [place] - conservatively managed.    Past Surgical History:  Procedure Laterality Date    ANKLE ARTHROSCOPY Left    fracture   CHOLECYSTECTOMY  2007   [place]   left frontal craniotomy for epidural hematoma   HIP ARTHROPLASTY Right 04/07/2022   Procedure: ARTHROPLASTY BIPOLAR HIP (HEMIARTHROPLASTY);  Surgeon: Earnestine Leys, MD;  Location: [facility] ORS;  Service: Orthopedics;  Laterality: Right;   INSERTION OF MESH  01/02/2022   Procedure: INSERTION OF MESH;  Surgeon: Herbert Pun, MD;  Location: [facility] ORS;  Service: General;;   LIGAMENT REPAIR Right    thumb   RETINAL DETACHMENT SURGERY     TREATMENT FISTULA ANAL  2006   Family History:  Family History  Problem Relation Age of Onset   Osteoporosis Mother    Depression Mother    Prostate cancer Father    Hypertension Father    Diabetes Brother    Kidney disease Neg Hx    Family Psychiatric  History: *** Social History:  Social History   Substance and Sexual Activity  Alcohol Use Not Currently     Social History   Substance and Sexual Activity  Drug Use No    Social History   Socioeconomic History   Marital status: Married    Spouse name: [name]   Number of children: 2   Years of education: Not on file   Highest education level: Master's degree (e.g., MA, MS, MEng, MEd, MSW, MBA)  Occupational History   Not  on file  Tobacco Use   Smoking status: Never   Smokeless tobacco: Never  Vaping Use   Vaping Use: Never used  Substance and Sexual Activity   Alcohol use: Not Currently   Drug use: No   Sexual activity: Yes    Birth control/protection: None  Other Topics Concern   Not on file  Social History Narrative   Live at home with wife.    Social Determinants of Health   Financial Resource Strain: Not on file  Food Insecurity: Not on file  Transportation Needs: Not on file  Physical Activity: Not on file  Stress: Not on file  Social Connections: Not on file   Additional Social History:                         Sleep: {BHH GOOD/FAIR/POOR:22877}  Appetite:  {BHH  GOOD/FAIR/POOR:22877}  Current Medications: Current Facility-Administered Medications  Medication Dose Route Frequency Provider Last Rate Last Admin   acetaminophen (TYLENOL) tablet 650 mg  650 mg Oral Q6H PRN Patrecia Pour, NP       alum & mag hydroxide-simeth (MAALOX/MYLANTA) 200-200-20 MG/5ML suspension 30 mL  30 mL Oral Q4H PRN Patrecia Pour, NP       ARIPiprazole (ABILIFY) tablet 5 mg  5 mg Oral QPC breakfast Parks Ranger, DO   5 mg at 07/06/22 3810   ascorbic acid (VITAMIN C) tablet 500 mg  500 mg Oral BID Parks Ranger, DO   500 mg at 07/06/22 1751   aspirin EC tablet 81 mg  81 mg Oral Daily Patrecia Pour, NP   81 mg at 07/06/22 0258   buPROPion (WELLBUTRIN XL) 24 hr tablet 300 mg  300 mg Oral Daily Patrecia Pour, NP   300 mg at 07/06/22 5277   docusate sodium (COLACE) capsule 100 mg  100 mg Oral BID Patrecia Pour, NP   100 mg at 07/06/22 8242   insulin aspart (novoLOG) injection 0-6 Units  0-6 Units Subcutaneous TID WC Parks Ranger, DO   1 Units at 07/06/22 1154   liver oil-zinc oxide (DESITIN) 40 % ointment   Topical BID Parks Ranger, DO   Given at 07/06/22 3536   magnesium hydroxide (MILK OF MAGNESIA) suspension 30 mL  30 mL Oral Daily PRN Patrecia Pour, NP       methocarbamol (ROBAXIN) tablet 500 mg  500 mg Oral Q6H PRN Patrecia Pour, NP       metoprolol tartrate (LOPRESSOR) tablet 25 mg  25 mg Oral BID Patrecia Pour, NP   25 mg at 07/06/22 1443   multivitamin with minerals tablet 1 tablet  1 tablet Oral Daily Parks Ranger, DO   1 tablet at 07/06/22 1540   polyethylene glycol (MIRALAX / GLYCOLAX) packet 17 g  17 g Oral Daily PRN Patrecia Pour, NP       protein supplement (ENSURE MAX) liquid  11 oz Oral BID Parks Ranger, DO   11 oz at 07/06/22 1037   senna (SENOKOT) tablet 8.6 mg  1 tablet Oral BID Patrecia Pour, NP   8.6 mg at 07/06/22 0838   sertraline (ZOLOFT) tablet 50 mg  50 mg Oral QHS Patrecia Pour, NP   50 mg at 07/05/22 2127   tamsulosin (FLOMAX) capsule 0.4 mg  0.4 mg Oral QPC supper Patrecia Pour, NP   0.4 mg at 07/05/22 1652   traZODone (  DESYREL) tablet 50 mg  50 mg Oral QHS PRN Patrecia Pour, NP   50 mg at 07/06/22 0126    Lab Results:  Results for orders placed or performed during the hospital encounter of 07/04/22 (from the past 48 hour(s))  Glucose, capillary     Status: Abnormal   Collection Time: 07/05/22  7:48 AM  Result Value Ref Range   Glucose-Capillary 104 (H) 70 - 99 mg/dL    Comment: Glucose reference range applies only to samples taken after fasting for at least 8 hours.  Glucose, capillary     Status: Abnormal   Collection Time: 07/05/22 11:37 AM  Result Value Ref Range   Glucose-Capillary 133 (H) 70 - 99 mg/dL    Comment: Glucose reference range applies only to samples taken after fasting for at least 8 hours.  Glucose, capillary     Status: Abnormal   Collection Time: 07/05/22  4:28 PM  Result Value Ref Range   Glucose-Capillary 116 (H) 70 - 99 mg/dL    Comment: Glucose reference range applies only to samples taken after fasting for at least 8 hours.  Glucose, capillary     Status: Abnormal   Collection Time: 07/05/22  7:44 PM  Result Value Ref Range   Glucose-Capillary 115 (H) 70 - 99 mg/dL    Comment: Glucose reference range applies only to samples taken after fasting for at least 8 hours.  Glucose, capillary     Status: Abnormal   Collection Time: 07/06/22  7:34 AM  Result Value Ref Range   Glucose-Capillary 121 (H) 70 - 99 mg/dL    Comment: Glucose reference range applies only to samples taken after fasting for at least 8 hours.  Glucose, capillary     Status: Abnormal   Collection Time: 07/06/22 11:42 AM  Result Value Ref Range   Glucose-Capillary 156 (H) 70 - 99 mg/dL    Comment: Glucose reference range applies only to samples taken after fasting for at least 8 hours.    Blood Alcohol level:  Lab Results  Component Value Date    ETH <10 07/02/2022   ETH <10 93/81/0175    Metabolic Disorder Labs: Lab Results  Component Value Date   HGBA1C 5.6 03/19/2022   MPG 114.02 03/19/2022   No results found for: "PROLACTIN" Lab Results  Component Value Date   CHOL 131 03/19/2022   TRIG 92 03/19/2022   HDL 33 (L) 03/19/2022   CHOLHDL 4.0 03/19/2022   VLDL 18 03/19/2022   LDLCALC 80 03/19/2022    Physical Findings: AIMS:  , ,  ,  ,    CIWA:    COWS:     Musculoskeletal: Strength & Muscle Tone: {desc; muscle tone:32375} Gait & Station: {PE GAIT ED ZWCH:85277} Patient leans: {Patient Leans:21022755}  Psychiatric Specialty Exam:  Presentation  General Appearance: Appropriate for Environment  Eye Contact:Good  Speech:Clear and Coherent  Speech Volume:Normal  Handedness:Right   Mood and Affect  Mood:Anxious (stated "a little anxious")  Affect:Congruent   Thought Process  Thought Processes:Linear; Coherent; Goal Directed  Descriptions of Associations:Intact  Orientation:Full (Time, Place and Person)  Thought Content:Logical; WDL  History of Schizophrenia/Schizoaffective disorder:No  Duration of Psychotic Symptoms:No data recorded Hallucinations:No data recorded Ideas of Reference:None  Suicidal Thoughts:No data recorded Homicidal Thoughts:No data recorded  Sensorium  Memory:Immediate Good  Judgment:Good  Insight:Good   Executive Functions  Concentration:Good  Attention Span:Good  [place] of Knowledge:Good  Language:Good   Psychomotor Activity  Psychomotor Activity:No data recorded  Assets  Assets:Housing; Financial Resources/Insurance; Desire for Improvement; Communication Skills; Resilience; Social Support   Sleep  Sleep:No data recorded   Physical Exam: Physical Exam ROS Blood pressure 111/84, pulse (!) 55, temperature 97.9 F (36.6 C), temperature source Oral, resp. rate 18, height '6\' 2"'$  (1.88 m), weight 70.8 kg, SpO2 100 %. Body mass index is  20.03 kg/m.   Treatment Plan Summary: {CHL [facility] MD [place]. OJJK:[id number]}  Kampbell Holaway 07/06/2022, 1:12 PM

## 2022-07-06 NOTE — Progress Notes (Signed)
Patient is alert and oriented times 2. Mood and affect are depressed. Patient rates pain as 0/10. He denies SI, HI, and AVH. Patient speaks very slowly and softly, almost to the point where it is hard to understand what he is saying. Patient does endorse feelings of depression at this time. States he slept well last night. Evening medicines administered whole by mouth without difficulty. Patient had trouble falling asleep, so PRN sleep medicine given, and the patient has been asleep after that without any complaints. Patient ate snack in day room; appetite was fair. Patient remains on unit with Q15 minute checks in place.

## 2022-07-06 NOTE — Progress Notes (Signed)
Pt presents with very depressed mood, soft speech with noted thought blocking and delayed responses. Patient stares for long periods of time before being able to answer and often times does not answer at all, simply staring confused. Patient denied any SI or HI with writer, and denies any A/V Hallucinations, however appears to be confused, pained and pensive/worried/fearful in much of his appearance. Patient per staff report requires assistance for adl's and is unable to ambulate on his own. Patient is able to eat and feed himself and ate well this am.  Patient currently within line of sight of staff in dayroom, as patient is very high fall risk and has had multiple falls previously. Patient is safe, will con't to monitor.

## 2022-07-06 NOTE — Progress Notes (Signed)
Patient is alert and oriented times 2. Mood and affect are depressed. Patient rates pain as 0/10. He denies SI, HI, and AVH. Patient does endorse feelings of anxiety and depression at this time. States he slept well last night. Evening medicines administered whole by mouth without difficulty. Patient ate snack in day room; appetite was fair. Patient remains on unit with Q15 minute checks in place.

## 2022-07-06 NOTE — Progress Notes (Signed)
Pt remains anxious, confused. Pt with incontinent episode within his depends, assisted to the bathroom and hygiene performed, and patient toileted. Pt is safe, will con't to monitor.

## 2022-07-07 ENCOUNTER — Other Ambulatory Visit: Payer: Self-pay | Admitting: Psychiatry

## 2022-07-07 LAB — GLUCOSE, CAPILLARY
Glucose-Capillary: 90 mg/dL (ref 70–99)
Glucose-Capillary: 108 mg/dL — ABNORMAL HIGH (ref 70–99)
Glucose-Capillary: 119 mg/dL — ABNORMAL HIGH (ref 70–99)
Glucose-Capillary: 124 mg/dL — ABNORMAL HIGH (ref 70–99)

## 2022-07-07 NOTE — Progress Notes (Signed)
Pt ambulated in hallway with 2 + staff, gait belt. Patient did well with staff encouraging patient to ambulate around 35 feet. Pt did tire quickly and requested to sit back in recliner. Patient has been repositioned aggressively this shift to prevent further skin breakdown. Additional pillow placed in recliner as well to further support sacral area.

## 2022-07-07 NOTE — BHH Group Notes (Signed)
Pt did not attend recreation and group

## 2022-07-07 NOTE — Progress Notes (Signed)
[facility] MD Progress Note  07/07/2022 11:11 AM John [name]  MRN:  [id number] Subjective:  Pt presents with depressed mood, affect flat. Pt continues to be confused at times, oriented x2, and largely quiet, with minimal to no interaction with peers and staff. When asking patient how he is doing, he has thought blocking and delayed responses and mumbles quietly, stating '' okay '' after several minutes. He requires assistance x 1 to help with adl's and bathroom assistance, but has been able to feed himself and eating well without issue. Patient denies any pain, and denies any acute concerns.   Patient is more quiet and down today; he just got his dressings changed and seems like he needs a bigger sacral pad. He is compliant with his medicines, is sleeping and eating well and did not reply to questions about SI.   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 06/2015 CT Head: abnl thickening of mid-dist R cervical ICA w/o narrowing - ? vasculitis and thrombosed/healing dissection; b. 01/2021 Carotid U/S: 1-39% bilat ICA stenoses.   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 Age 21 - struck in head w/ baseball --> s/p craniotomy.   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 Approx 2015 - eval in [place] - conservatively managed.    Past Surgical History:  Procedure Laterality Date   ANKLE ARTHROSCOPY Left    fracture   CHOLECYSTECTOMY  2007   [place]   left frontal craniotomy for epidural hematoma   HIP ARTHROPLASTY Right 04/07/2022   Procedure: ARTHROPLASTY BIPOLAR HIP (HEMIARTHROPLASTY);  Surgeon: Earnestine Leys, MD;  Location: [facility] ORS;  Service: Orthopedics;  Laterality: Right;   INSERTION OF MESH  01/02/2022   Procedure: INSERTION OF MESH;  Surgeon: Herbert Pun, MD;  Location: [facility] ORS;  Service: General;;   LIGAMENT REPAIR Right    thumb   RETINAL DETACHMENT SURGERY     TREATMENT FISTULA ANAL  2006   Family History:  Family History  Problem Relation Age of Onset   Osteoporosis Mother    Depression Mother    Prostate cancer Father    Hypertension Father    Diabetes Brother    Kidney disease Neg Hx    Family Psychiatric  History: Denies Social History:  Social History   Substance and Sexual Activity  Alcohol Use Not Currently     Social History   Substance and Sexual Activity  Drug Use No    Social History   Socioeconomic History   Marital status: Married    Spouse name: [name]  Number of children: 2   Years of education: Not on file   Highest education level: Master's degree (e.g., MA, MS, MEng, MEd, MSW, MBA)  Occupational History   Not on file  Tobacco Use   Smoking status: Never   Smokeless tobacco: Never  Vaping Use   Vaping Use: Never used  Substance and Sexual Activity   Alcohol use: Not Currently   Drug use: No   Sexual activity: Yes    Birth control/protection: None  Other Topics Concern   Not on file  Social History Narrative   Live at home with wife.    Social Determinants of Health   Financial Resource Strain: Not on file  Food Insecurity: Not on file  Transportation Needs: Not on file  Physical Activity: Not on file   Stress: Not on file  Social Connections: Not on file   Additional Social History:                         Sleep: Fair  Appetite:  Fair  Current Medications: Current Facility-Administered Medications  Medication Dose Route Frequency Provider Last Rate Last Admin   acetaminophen (TYLENOL) tablet 650 mg  650 mg Oral Q6H PRN Patrecia Pour, NP       alum & mag hydroxide-simeth (MAALOX/MYLANTA) 200-200-20 MG/5ML suspension 30 mL  30 mL Oral Q4H PRN Patrecia Pour, NP       ARIPiprazole (ABILIFY) tablet 5 mg  5 mg Oral QPC breakfast Parks Ranger, DO   5 mg at 07/07/22 0932   ascorbic acid (VITAMIN C) tablet 500 mg  500 mg Oral BID Parks Ranger, DO   500 mg at 07/07/22 0756   aspirin EC tablet 81 mg  81 mg Oral Daily Patrecia Pour, NP   81 mg at 07/07/22 0757   buPROPion (WELLBUTRIN XL) 24 hr tablet 300 mg  300 mg Oral Daily Patrecia Pour, NP   300 mg at 07/07/22 0757   docusate sodium (COLACE) capsule 100 mg  100 mg Oral BID Patrecia Pour, NP   100 mg at 07/07/22 0756   insulin aspart (novoLOG) injection 0-6 Units  0-6 Units Subcutaneous TID WC Parks Ranger, DO   1 Units at 07/06/22 1154   liver oil-zinc oxide (DESITIN) 40 % ointment   Topical BID Parks Ranger, DO   Given at 07/07/22 0759   magnesium hydroxide (MILK OF MAGNESIA) suspension 30 mL  30 mL Oral Daily PRN Patrecia Pour, NP       methocarbamol (ROBAXIN) tablet 500 mg  500 mg Oral Q6H PRN Patrecia Pour, NP       metoprolol tartrate (LOPRESSOR) tablet 25 mg  25 mg Oral BID Patrecia Pour, NP   25 mg at 07/07/22 0756   multivitamin with minerals tablet 1 tablet  1 tablet Oral Daily Parks Ranger, DO   1 tablet at 07/07/22 0756   polyethylene glycol (MIRALAX / GLYCOLAX) packet 17 g  17 g Oral Daily PRN Patrecia Pour, NP       protein supplement (ENSURE MAX) liquid  11 oz Oral BID Parks Ranger, DO   11 oz at 07/07/22 0830   senna (SENOKOT) tablet  8.6 mg  1 tablet Oral BID Patrecia Pour, NP   8.6 mg at 07/07/22 0758   sertraline (ZOLOFT) tablet 50 mg  50 mg Oral QHS Patrecia Pour, NP   50 mg  at 07/06/22 2130   tamsulosin (FLOMAX) capsule 0.4 mg  0.4 mg Oral QPC supper Patrecia Pour, NP   0.4 mg at 07/06/22 1633   traZODone (DESYREL) tablet 50 mg  50 mg Oral QHS PRN Patrecia Pour, NP   50 mg at 07/06/22 2256    Lab Results:  Results for orders placed or performed during the hospital encounter of 07/04/22 (from the past 48 hour(s))  Glucose, capillary     Status: Abnormal   Collection Time: 07/05/22 11:37 AM  Result Value Ref Range   Glucose-Capillary 133 (H) 70 - 99 mg/dL    Comment: Glucose reference range applies only to samples taken after fasting for at least 8 hours.  Glucose, capillary     Status: Abnormal   Collection Time: 07/05/22  4:28 PM  Result Value Ref Range   Glucose-Capillary 116 (H) 70 - 99 mg/dL    Comment: Glucose reference range applies only to samples taken after fasting for at least 8 hours.  Glucose, capillary     Status: Abnormal   Collection Time: 07/05/22  7:44 PM  Result Value Ref Range   Glucose-Capillary 115 (H) 70 - 99 mg/dL    Comment: Glucose reference range applies only to samples taken after fasting for at least 8 hours.  Glucose, capillary     Status: Abnormal   Collection Time: 07/06/22  7:34 AM  Result Value Ref Range   Glucose-Capillary 121 (H) 70 - 99 mg/dL    Comment: Glucose reference range applies only to samples taken after fasting for at least 8 hours.  Glucose, capillary     Status: Abnormal   Collection Time: 07/06/22 11:42 AM  Result Value Ref Range   Glucose-Capillary 156 (H) 70 - 99 mg/dL    Comment: Glucose reference range applies only to samples taken after fasting for at least 8 hours.  Glucose, capillary     Status: Abnormal   Collection Time: 07/06/22  4:19 PM  Result Value Ref Range   Glucose-Capillary 122 (H) 70 - 99 mg/dL    Comment: Glucose reference range  applies only to samples taken after fasting for at least 8 hours.  Glucose, capillary     Status: None   Collection Time: 07/06/22  7:22 PM  Result Value Ref Range   Glucose-Capillary 93 70 - 99 mg/dL    Comment: Glucose reference range applies only to samples taken after fasting for at least 8 hours.  Glucose, capillary     Status: None   Collection Time: 07/07/22  7:15 AM  Result Value Ref Range   Glucose-Capillary 90 70 - 99 mg/dL    Comment: Glucose reference range applies only to samples taken after fasting for at least 8 hours.    Blood Alcohol level:  Lab Results  Component Value Date   ETH <10 07/02/2022   ETH <10 73/22/0254    Metabolic Disorder Labs: Lab Results  Component Value Date   HGBA1C 5.6 03/19/2022   MPG 114.02 03/19/2022   No results found for: "PROLACTIN" Lab Results  Component Value Date   CHOL 131 03/19/2022   TRIG 92 03/19/2022   HDL 33 (L) 03/19/2022   CHOLHDL 4.0 03/19/2022   VLDL 18 03/19/2022   LDLCALC 80 03/19/2022    Physical Findings: AIMS:  , ,  ,  ,    CIWA:    COWS:     Musculoskeletal: Strength & Muscle Tone: decreased Gait & Station: unsteady Patient leans: [place]  Specialty Exam:  Presentation  General Appearance: Appropriate for Environment  Eye Contact:Good  Speech:Clear and Coherent  Speech Volume:Normal  Handedness:Right   Mood and Affect  Mood:Anxious (stated "a little anxious")  Affect:Congruent   Thought Process  Thought Processes:Linear; Coherent; Goal Directed  Descriptions of Associations:Intact  Orientation:Full (Time, Place and Person)  Thought Content:Logical; WDL  History of Schizophrenia/Schizoaffective disorder:No  Duration of Psychotic Symptoms:No data recorded Hallucinations:No data recorded Ideas of Reference:None  Suicidal Thoughts:No data recorded Homicidal Thoughts:No data recorded  Sensorium  Memory:Immediate Good  Judgment:Good  Insight:Good   Executive  Functions  Concentration:Good  Attention Span:Good  [place] of Knowledge:Good  Language:Good   Psychomotor Activity  Psychomotor Activity:No data recorded  Assets  Assets:Housing; Financial Resources/Insurance; Desire for Improvement; Communication Skills; Resilience; Social Support   Sleep  Sleep:No data recorded   Physical Exam: Physical Exam Constitutional:      Appearance: Normal appearance. He is normal weight.  HENT:     Head: Normocephalic and atraumatic.     Right Ear: External ear normal.     Left Ear: External ear normal.     Nose: Nose normal.     Mouth/Throat:     Mouth: Mucous membranes are dry.     Pharynx: Oropharynx is clear.  Eyes:     Extraocular Movements: Extraocular movements intact.     Conjunctiva/sclera: Conjunctivae normal.     Pupils: Pupils are equal, round, and reactive to light.  Cardiovascular:     Rate and Rhythm: Normal rate and regular rhythm.     Pulses: Normal pulses.     Heart sounds: Normal heart sounds.  Pulmonary:     Effort: Pulmonary effort is normal.     Breath sounds: Normal breath sounds.  Abdominal:     General: Abdomen is flat. Bowel sounds are normal.     Palpations: Abdomen is soft.  Musculoskeletal:        General: Normal range of motion.     Cervical back: Normal range of motion and neck supple.  Skin:    General: Skin is warm.  Neurological:     General: No focal deficit present.     Mental Status: He is alert.    Review of Systems  Constitutional: Negative.   HENT: Negative.    Eyes: Negative.   Respiratory: Negative.    Cardiovascular: Negative.   Gastrointestinal: Negative.   Genitourinary: Negative.   Musculoskeletal:  Positive for joint pain.  Skin: Negative.   Neurological: Negative.   Endo/Heme/Allergies: Negative.   Psychiatric/Behavioral:  Positive for depression. The patient is nervous/anxious.    Blood pressure 117/78, pulse (!) 59, temperature 98.8 F (37.1 C),  temperature source Oral, resp. rate 16, height '6\' 2"'$  (1.88 m), weight 70.8 kg, SpO2 100 %. Body mass index is 20.03 kg/m.   Treatment Plan Summary: Daily contact with patient to assess and evaluate symptoms and progress in treatment, Medication management, and Plan Continue current medicines and doses.   Trevan Messman 07/07/2022, 11:11 AM

## 2022-07-07 NOTE — Progress Notes (Signed)
Patient with pressure ulcer forming to sacral area. Previous order in place for small dressing/pad to breakdown area on buttocks, however the majority of the patients buttocks is reddened due to pronounced bony prominence, and lack of movement. Patient has remained on toileting routine this shift as well as previous shifts and no urine in adult diaper noted. Patient is assisted in shifting body weight and standing up to relieve pressure. MD notified of reddened area and agrees patient may benefit from larger sacral dressing, and order was placed.  Dressing applied after wiping to ensure skin was clean and dressing applied to reddened area.

## 2022-07-07 NOTE — Progress Notes (Signed)
Pt presents with depressed mood, affect flat. Pt continues to be confused at times, oriented x2, and largely quiet, with minimal to no interaction with peers and staff. When asking patient how he is doing, he has thought blocking and delayed responses and mumbles quietly, stating '' okay '' after several minutes. He requires assistance x 1 to help with adl's and bathroom assistance, but has been able to feed himself and eating well without issue. Patient denies any pain, and denies any acute concerns. Pt is safe, will con't to monitor.

## 2022-07-08 DIAGNOSIS — L8990 Pressure ulcer of unspecified site, unspecified stage: Secondary | ICD-10-CM | POA: Insufficient documentation

## 2022-07-08 DIAGNOSIS — F332 Major depressive disorder, recurrent severe without psychotic features: Secondary | ICD-10-CM | POA: Diagnosis not present

## 2022-07-08 LAB — GLUCOSE, CAPILLARY
Glucose-Capillary: 105 mg/dL — ABNORMAL HIGH (ref 70–99)
Glucose-Capillary: 103 mg/dL — ABNORMAL HIGH (ref 70–99)
Glucose-Capillary: 110 mg/dL — ABNORMAL HIGH (ref 70–99)
Glucose-Capillary: 98 mg/dL (ref 70–99)

## 2022-07-08 MED ORDER — METOPROLOL TARTRATE 25 MG PO TABS
12.5000 mg | ORAL_TABLET | Freq: Two times a day (BID) | ORAL | Status: DC
  Administered 2022-07-08 – 2022-07-23 (×30): 12.5 mg via ORAL
  Filled 2022-07-08 (×33): qty 1

## 2022-07-08 MED ORDER — ARIPIPRAZOLE 5 MG PO TABS
10.0000 mg | ORAL_TABLET | Freq: Every day | ORAL | Status: DC
Start: 2022-07-09 — End: 2022-07-11
  Administered 2022-07-09 – 2022-07-11 (×3): 10 mg via ORAL
  Filled 2022-07-08 (×3): qty 2

## 2022-07-08 NOTE — Plan of Care (Signed)
  Problem: Nutrition: Goal: Adequate nutrition will be maintained Outcome: Progressing   Problem: Coping: Goal: Level of anxiety will decrease Outcome: Progressing   Problem: Safety: Goal: Ability to remain free from injury will improve Outcome: Progressing   Problem: Coping: Goal: Will verbalize feelings Outcome: Not Progressing

## 2022-07-08 NOTE — Progress Notes (Signed)
Physical Therapy Treatment Patient Details Name: John [name] MRN: [id number] DOB: [birth year] Today's Date: 07/08/2022   History of Present Illness Pt is a 81 y/o M who was voluntarily admitted to geriatric psychiatry for worsening depression. Pt was d/c from the unit ~2 months ago & was noncompliatn with medications. PMH: CA, depression, DM2, epidural hematoma s/p craniotomy (age 3), Horner syndrome, HTN, migraine, obesity, sleep apnea    PT Comments    Pt seen for PT tx with pt with flat affect, minimal verbal communication, but does follow simple commands with extra time throughout session. Pt requires more assistance for STS on this date, as pt requires min assist & demonstrates posterior lean on to recliner. Session focused on gait without AD with PT providing manual facilitation & tactile/verbal cuing for increased weight shifting to RLE during stance phase & increased step length RLE but poor return demo from pt. Pt engages in exercises focusing on balance & RLE strengthening. Pt denies fatigue at end of session but appears fatigued as gait & balance worsen. Will continue to follow pt acutely to address balance, strengthening, and gait with LRAD.    Recommendations for follow up therapy are one component of a multi-disciplinary discharge planning process, led by the attending physician.  Recommendations may be updated based on patient status, additional functional criteria and insurance authorization.  Follow Up Recommendations  Home health PT     Assistance Recommended at Discharge Frequent or constant Supervision/Assistance  Patient can return home with the following A little help with walking and/or transfers;A little help with bathing/dressing/bathroom;Assistance with cooking/housework;Direct supervision/assist for financial management;Assist for transportation;Help with stairs or ramp for entrance;Direct supervision/assist for medications management   Equipment Recommendations  None  recommended by PT    Recommendations for Other Services       Precautions / Restrictions Precautions Precautions: Fall Restrictions Weight Bearing Restrictions: No     Mobility  Bed Mobility               General bed mobility comments: not observed, pt received & left sitting in recliner in dayroom    Transfers Overall transfer level: Needs assistance Equipment used: Rolling walker (2 wheels) Transfers: Sit to/from Stand Sit to Stand: Min assist           General transfer comment: cuing for hand placement on recliner armrests, pt with posterior lean on to recliner during sit>stand    Ambulation/Gait Ambulation/Gait assistance: Min assist Gait Distance (Feet): 125 Feet Assistive device: None   Gait velocity: decreased     General Gait Details: Pt with decreased weight shift to RLE during stance phase, decreased step length RLE. Multiple LOB with min assist to correct.   Stairs             Wheelchair Mobility    Modified Rankin (Stroke Patients Only)       Balance Overall balance assessment: Needs assistance         Standing balance support: During functional activity, No upper extremity supported Standing balance-Leahy Scale: Poor                              Cognition Arousal/Alertness: Awake/alert Behavior During Therapy: Flat affect Overall Cognitive Status: No family/caregiver present to determine baseline cognitive functioning                                 General  Comments: Pt requires significantly extra time to respond to PT & only does so ~20% of the time. Follows simple commands with extra time.        Exercises Other Exercises Other Exercises: Pt engages in RLE single leg stance with BUE support fade to RUE support with min assist. Exercise focused on RLE strengthening & balance, to promote increased weight shifting to RLE during gait. Other Exercises: Pt performed tandem stance with RLE in  back, LLE in front with RUE support fade to no UE support with min assist with activity focusing on RLE strengthening & NMR for balance.    General Comments        Pertinent Vitals/Pain Pain Assessment Pain Assessment: Faces Faces Pain Scale: Hurts a little bit Pain Location: buttocks (pt noted to have a wound on buttocks) Pain Descriptors / Indicators: Discomfort Pain Intervention(s): Repositioned    Home Living                          Prior Function            PT Goals (current goals can now be found in the care plan section) Acute Rehab PT Goals Patient Stated Goal: none stated PT Goal Formulation: With patient Time For Goal Achievement: 07/19/22 Potential to Achieve Goals: Fair Progress towards PT goals: Progressing toward goals    Frequency    Min 2X/week      PT Plan Current plan remains appropriate    Co-evaluation              AM-PAC PT "6 Clicks" Mobility   Outcome Measure  Help needed turning from your back to your side while in a flat bed without using bedrails?: None Help needed moving from lying on your back to sitting on the side of a flat bed without using bedrails?: A Little Help needed moving to and from a bed to a chair (including a wheelchair)?: A Little Help needed standing up from a chair using your arms (e.g., wheelchair or bedside chair)?: A Little Help needed to walk in hospital room?: A Little Help needed climbing 3-5 steps with a railing? : A Little 6 Click Score: 19    End of Session Equipment Utilized During Treatment: Gait belt Activity Tolerance: Patient tolerated treatment well;Patient limited by fatigue Patient left: in chair (in day room in care of staff)   PT Visit Diagnosis: Unsteadiness on feet (R26.81);Muscle weakness (generalized) (M62.81)     Time: 4098-1191 PT Time Calculation (min) (ACUTE ONLY): 13 min  Charges:  $Therapeutic Activity: 8-22 mins                     Lavone Nian, PT,  DPT 07/08/22, 11:55 AM\   Waunita Schooner 07/08/2022, 11:53 AM

## 2022-07-08 NOTE — Progress Notes (Signed)
[facility] MD Progress Note  07/08/2022 12:57 PM John [name]  MRN:  [id number] Subjective: John [name] has been compliant with his medication.  He continues to be depressed and withdrawn and mostly mute.  He does answer questions softly with 1 word answers.  We should be able to put him on the ECT schedule soon.  His appetite is improving.  Principal Problem: Major depressive disorder, recurrent severe without psychotic features ([place]) Diagnosis: Principal Problem:   Major depressive disorder, recurrent severe without psychotic features ([place]) Active Problems:   Pressure injury of skin  Total Time spent with patient: 15 minutes  Past Psychiatric History:   Patient has a history of past depression repeated episodes throughout his life.  1 prior suicide attempt.  Was admitted to the hospital in mid May here with depression.  Was not showing significant improvement over time and was referred for ECT.  Patient received a few ECT treatments which generally appeared to be tolerated well. He did develop an arrhythmia which resolved.  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 06/2015 CT Head: abnl thickening of mid-dist R cervical ICA w/o narrowing - ? vasculitis and thrombosed/healing dissection; b. 01/2021 Carotid U/S: 1-39% bilat ICA stenoses.   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 Age 49 - struck in head w/ baseball --> s/p craniotomy.   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 Approx 2015 - eval in [place]  - conservatively managed.    Past Surgical History:  Procedure Laterality Date   ANKLE ARTHROSCOPY Left    fracture   CHOLECYSTECTOMY  2007   [place]   left frontal craniotomy for epidural hematoma   HIP ARTHROPLASTY Right 04/07/2022   Procedure: ARTHROPLASTY BIPOLAR HIP (HEMIARTHROPLASTY);  Surgeon: Earnestine Leys, MD;  Location: [facility] ORS;  Service: Orthopedics;  Laterality: Right;   INSERTION OF MESH  01/02/2022   Procedure: INSERTION OF MESH;  Surgeon: Herbert Pun, MD;  Location: [facility] ORS;  Service: General;;   LIGAMENT REPAIR Right    thumb   RETINAL DETACHMENT SURGERY     TREATMENT FISTULA ANAL  2006   Family History:  Family History  Problem Relation Age of Onset   Osteoporosis Mother    Depression Mother    Prostate cancer Father    Hypertension Father    Diabetes Brother    Kidney disease Neg Hx    Social History:  Social History   Substance and Sexual Activity  Alcohol Use Not Currently     Social History   Substance and Sexual Activity  Drug Use No    Social History   Socioeconomic History   Marital status: Married    Spouse name: [name]   Number of children: 2   Years of education: Not on file   Highest education level:  Master's degree (e.g., MA, MS, MEng, MEd, MSW, MBA)  Occupational History   Not on file  Tobacco Use   Smoking status: Never   Smokeless tobacco: Never  Vaping Use   Vaping Use: Never used  Substance and Sexual Activity   Alcohol use: Not Currently   Drug use: No   Sexual activity: Yes    Birth control/protection: None  Other Topics Concern   Not on file  Social History Narrative   Live at home with wife.    Social Determinants of Health   Financial Resource Strain: Not on file  Food Insecurity: Not on file  Transportation Needs: Not on file  Physical Activity: Not on file  Stress: Not on file  Social Connections: Not on file   Additional Social History:                         Sleep:  Good  Appetite:  Fair  Current Medications: Current Facility-Administered Medications  Medication Dose Route Frequency Provider Last Rate Last Admin   acetaminophen (TYLENOL) tablet 650 mg  650 mg Oral Q6H PRN Patrecia Pour, NP       alum & mag hydroxide-simeth (MAALOX/MYLANTA) 200-200-20 MG/5ML suspension 30 mL  30 mL Oral Q4H PRN Patrecia Pour, NP       ARIPiprazole (ABILIFY) tablet 5 mg  5 mg Oral QPC breakfast Parks Ranger, DO   5 mg at 07/08/22 0845   ascorbic acid (VITAMIN C) tablet 500 mg  500 mg Oral BID Parks Ranger, DO   500 mg at 07/08/22 1740   aspirin EC tablet 81 mg  81 mg Oral Daily Patrecia Pour, NP   81 mg at 07/08/22 0843   buPROPion (WELLBUTRIN XL) 24 hr tablet 300 mg  300 mg Oral Daily Patrecia Pour, NP   300 mg at 07/08/22 0844   docusate sodium (COLACE) capsule 100 mg  100 mg Oral BID Patrecia Pour, NP   100 mg at 07/08/22 0845   insulin aspart (novoLOG) injection 0-6 Units  0-6 Units Subcutaneous TID WC Parks Ranger, DO   1 Units at 07/06/22 1154   liver oil-zinc oxide (DESITIN) 40 % ointment   Topical BID Parks Ranger, DO   Given at 07/07/22 2111   magnesium hydroxide (MILK OF MAGNESIA) suspension 30 mL  30 mL Oral Daily PRN Patrecia Pour, NP       methocarbamol (ROBAXIN) tablet 500 mg  500 mg Oral Q6H PRN Patrecia Pour, NP       metoprolol tartrate (LOPRESSOR) tablet 12.5 mg  12.5 mg Oral BID Parks Ranger, DO       multivitamin with minerals tablet 1 tablet  1 tablet Oral Daily Parks Ranger, DO   1 tablet at 07/08/22 0845   polyethylene glycol (MIRALAX / GLYCOLAX) packet 17 g  17 g Oral Daily PRN Patrecia Pour, NP       protein supplement (ENSURE MAX) liquid  11 oz Oral BID Parks Ranger, DO   11 oz at 07/08/22 0856   senna (SENOKOT) tablet 8.6 mg  1 tablet Oral BID Patrecia Pour, NP   8.6 mg at 07/08/22 0844   sertraline (ZOLOFT) tablet 50 mg  50 mg Oral QHS Patrecia Pour,  NP   50 mg at 07/07/22 2111   tamsulosin (FLOMAX) capsule 0.4 mg  0.4 mg Oral QPC supper Patrecia Pour, NP  0.4 mg at 07/07/22 1640   traZODone (DESYREL) tablet 50 mg  50 mg Oral QHS PRN Patrecia Pour, NP   50 mg at 07/07/22 2112    Lab Results:  Results for orders placed or performed during the hospital encounter of 07/04/22 (from the past 48 hour(s))  Glucose, capillary     Status: Abnormal   Collection Time: 07/06/22  4:19 PM  Result Value Ref Range   Glucose-Capillary 122 (H) 70 - 99 mg/dL    Comment: Glucose reference range applies only to samples taken after fasting for at least 8 hours.  Glucose, capillary     Status: None   Collection Time: 07/06/22  7:22 PM  Result Value Ref Range   Glucose-Capillary 93 70 - 99 mg/dL    Comment: Glucose reference range applies only to samples taken after fasting for at least 8 hours.  Glucose, capillary     Status: None   Collection Time: 07/07/22  7:15 AM  Result Value Ref Range   Glucose-Capillary 90 70 - 99 mg/dL    Comment: Glucose reference range applies only to samples taken after fasting for at least 8 hours.  Glucose, capillary     Status: Abnormal   Collection Time: 07/07/22 11:39 AM  Result Value Ref Range   Glucose-Capillary 108 (H) 70 - 99 mg/dL    Comment: Glucose reference range applies only to samples taken after fasting for at least 8 hours.  Glucose, capillary     Status: Abnormal   Collection Time: 07/07/22  3:52 PM  Result Value Ref Range   Glucose-Capillary 119 (H) 70 - 99 mg/dL    Comment: Glucose reference range applies only to samples taken after fasting for at least 8 hours.  Glucose, capillary     Status: Abnormal   Collection Time: 07/07/22  7:48 PM  Result Value Ref Range   Glucose-Capillary 124 (H) 70 - 99 mg/dL    Comment: Glucose reference range applies only to samples taken after fasting for at least 8 hours.  Glucose, capillary     Status: Abnormal   Collection Time: 07/08/22  8:01 AM  Result Value  Ref Range   Glucose-Capillary 105 (H) 70 - 99 mg/dL    Comment: Glucose reference range applies only to samples taken after fasting for at least 8 hours.  Glucose, capillary     Status: Abnormal   Collection Time: 07/08/22 11:48 AM  Result Value Ref Range   Glucose-Capillary 103 (H) 70 - 99 mg/dL    Comment: Glucose reference range applies only to samples taken after fasting for at least 8 hours.    Blood Alcohol level:  Lab Results  Component Value Date   ETH <10 07/02/2022   ETH <10 51/11/5850    Metabolic Disorder Labs: Lab Results  Component Value Date   HGBA1C 5.6 03/19/2022   MPG 114.02 03/19/2022   No results found for: "PROLACTIN" Lab Results  Component Value Date   CHOL 131 03/19/2022   TRIG 92 03/19/2022   HDL 33 (L) 03/19/2022   CHOLHDL 4.0 03/19/2022   VLDL 18 03/19/2022   LDLCALC 80 03/19/2022    Physical Findings: AIMS:  , ,  ,  ,    CIWA:    COWS:     Musculoskeletal: Strength & Muscle Tone: within normal limits Gait & Station: unable to stand Patient leans: N/A  Psychiatric Specialty Exam:  Presentation  General Appearance: Appropriate for Environment; Casual  Eye Contact:Fleeting  Speech:Garbled  Speech  Volume:Decreased  Handedness:Right   Mood and Affect  Mood:Anxious; Depressed  Affect:Flat   Thought Process  Thought Processes:Linear; Coherent; Goal Directed  Descriptions of Associations:Loose  Orientation:Partial  Thought Content:Scattered  History of Schizophrenia/Schizoaffective disorder:No  Duration of Psychotic Symptoms:No data recorded Hallucinations:Hallucinations: None  Ideas of Reference:None  Suicidal Thoughts:Suicidal Thoughts: No  Homicidal Thoughts:Homicidal Thoughts: No   Sensorium  Memory:Recent Poor; Remote Poor  Judgment:Poor  Insight:Fair   Executive Functions  Concentration:Fair  Attention Span:Poor  Recall:Poor  Fund of Knowledge:Poor  Language:Poor   Psychomotor Activity   Psychomotor Activity:Psychomotor Activity: Psychomotor Retardation   Assets  Assets:Financial Resources/Insurance   Sleep  Sleep:Sleep: Fair    Physical Exam: Physical Exam Vitals and nursing note reviewed.  Constitutional:      Appearance: Normal appearance. He is normal weight.  Neurological:     General: No focal deficit present.     Mental Status: He is alert and oriented to person, place, and time.  Psychiatric:        Attention and Perception: Attention and perception normal.        Mood and Affect: Mood is depressed. Affect is flat.        Speech: He is noncommunicative. Speech is delayed.        Behavior: Behavior normal. Behavior is cooperative.        Thought Content: Thought content normal.        Cognition and Memory: Cognition is impaired. Memory is impaired.        Judgment: Judgment normal.    Review of Systems  Constitutional: Negative.   HENT: Negative.    Eyes: Negative.   Respiratory: Negative.    Cardiovascular: Negative.   Gastrointestinal: Negative.   Genitourinary: Negative.   Musculoskeletal: Negative.   Skin: Negative.   Neurological: Negative.   Endo/Heme/Allergies: Negative.   Psychiatric/Behavioral:  Positive for depression.    Blood pressure 128/84, pulse (!) 56, temperature 97.9 F (36.6 C), temperature source Oral, resp. rate 18, height '6\' 2"'$  (1.88 m), weight 70.8 kg, SpO2 99 %. Body mass index is 20.03 kg/m.   Treatment Plan Summary: Daily contact with patient to assess and evaluate symptoms and progress in treatment, Medication management, and Plan increase Abilify to 10 mg/day.  Parks Ranger, DO 07/08/2022, 12:57 PM

## 2022-07-08 NOTE — BH Assessment (Signed)
   D:  Patient alert and oriented x 2.  Patient with flat, sad  affect. Patient endorses anxiety and depression. Denies SI/HI and AVH. Patient has delayed responses, but is cooperative with staff.   Patient is able to feed himself.   Sacral Mepilex clean and intact.  New dressing was not applied today.   A:  Medications administered as ordered.  Emotional support and encouragement offered.  Frequent verbal interaction with patient.  Q15 minute checks in place for safety.   R:  Patient compliant with medications.  No adverse reactions noted.  Patient contracts for safety. Minimal interaction with peers. Patient is present in the milieu.

## 2022-07-08 NOTE — Progress Notes (Signed)
Patient is alert and oriented times 2. Mood and affect appropriate. Patient rates pain as 0/10. He denies SI, HI, and AVH. Patient also denies  feelings of anxiety and depression at this time. Patient states he did not sleep well last night, and asked if this RN could give him something to help him sleep. Evening medicines administered whole by mouth without difficulty. Patient ate snack in day room; appetite was poor. Patient remains on unit with Q15 minute checks in place and 1:1 at nighttime.

## 2022-07-09 DIAGNOSIS — F332 Major depressive disorder, recurrent severe without psychotic features: Secondary | ICD-10-CM | POA: Diagnosis not present

## 2022-07-09 LAB — GLUCOSE, CAPILLARY
Glucose-Capillary: 106 mg/dL — ABNORMAL HIGH (ref 70–99)
Glucose-Capillary: 100 mg/dL — ABNORMAL HIGH (ref 70–99)
Glucose-Capillary: 147 mg/dL — ABNORMAL HIGH (ref 70–99)
Glucose-Capillary: 101 mg/dL — ABNORMAL HIGH (ref 70–99)

## 2022-07-09 LAB — URINALYSIS, ROUTINE W REFLEX MICROSCOPIC
Bilirubin Urine: NEGATIVE
Glucose, UA: NEGATIVE mg/dL
Hgb urine dipstick: NEGATIVE
Ketones, ur: NEGATIVE mg/dL
Leukocytes,Ua: NEGATIVE
Nitrite: NEGATIVE
Protein, ur: NEGATIVE mg/dL
Specific Gravity, Urine: 1.028 (ref 1.005–1.030)
pH: 5 (ref 5.0–8.0)

## 2022-07-09 NOTE — Progress Notes (Signed)
Occupational Therapy Treatment Patient Details Name: John [name] MRN: [id number] DOB: [birth year] Today's Date: 07/09/2022   History of present illness Pt is a 81 y/o M who was voluntarily admitted to geriatric psychiatry for worsening depression. Pt was d/c from the unit ~2 months ago & was noncompliatn with medications. PMH: CA, depression, DM2, epidural hematoma s/p craniotomy (age 4), Horner syndrome, HTN, migraine, obesity, sleep apnea   OT comments  John [name] was seen for OT treatment on this date. Upon arrival to room pt seated in dayroom, agreeable to tx. Pt requires SBA hair brushing stnading sink side. CGA shower t/f - minor LOBs, ues of grab bar for support. CGA pick paper bag off of floor and carry around room, MIN A for initial posterior LOB when picking up bag. Tolerates ~150 ft funcitonal mobility without AD, use of RW for ~100 ft carrying snacks in each hand. Noted difficulty selecing drink vs snack, requires MOD cues to complete. Pt making good progress toward goals, will continue to follow POC. Discharge recommendation remains appropriate.     Recommendations for follow up therapy are one component of a multi-disciplinary discharge planning process, led by the attending physician.  Recommendations may be updated based on patient status, additional functional criteria and insurance authorization.    Follow Up Recommendations  Home health OT    Assistance Recommended at Discharge Frequent or constant Supervision/Assistance  Patient can return home with the following  Direct supervision/assist for medications management;Direct supervision/assist for financial management;Help with stairs or ramp for entrance   Equipment Recommendations  None recommended by OT    Recommendations for Other Services      Precautions / Restrictions Precautions Precautions: Fall Restrictions Weight Bearing Restrictions: No       Mobility Bed Mobility               General bed  mobility comments: not observed, pt received & left sitting in recliner in dayroom    Transfers Overall transfer level: Needs assistance Equipment used: Rolling walker (2 wheels) Transfers: Sit to/from Stand Sit to Stand: Min guard                 Balance Overall balance assessment: Needs assistance Sitting-balance support: No upper extremity supported, Feet supported Sitting balance-Leahy Scale: Good     Standing balance support: No upper extremity supported, During functional activity Standing balance-Leahy Scale: Fair                             ADL either performed or assessed with clinical judgement   ADL Overall ADL's : Needs assistance/impaired                                       General ADL Comments: SBA hair brushing stnading sink side. CGA shower t/f - minor LOBs, ues of grab bar for support. CGA pick paper bag off of floor and carry around room, MIN A for initial posterior LOB when picking up bag. Tolerates ~150 ft funcitonal mobility without AD, use of RW for ~100 ft carrying snacks in each hand. Noted difficulty selecing drink vs snack, requires MOD cues to complete.      Cognition Arousal/Alertness: Awake/alert Behavior During Therapy: WFL for tasks assessed/performed Overall Cognitive Status: Within Functional Limits for tasks assessed  General Comments: soft voice but answers more questions appropriately and engages in short conversation        Exercises Other Exercises Other Exercises: Discussed leisure activity preferences            Pertinent Vitals/ Pain       Pain Assessment Pain Assessment: No/denies pain   Frequency  Min 2X/week        Progress Toward Goals  OT Goals(current goals can now be found in the care plan section)  Progress towards OT goals: Progressing toward goals  Acute Rehab OT Goals Patient Stated Goal: not to fall OT Goal Formulation:  With patient Time For Goal Achievement: 07/19/22 Potential to Achieve Goals: Good ADL Goals Pt Will Perform Tub/Shower Transfer: Tub transfer;Independently;ambulating Additional ADL Goal #1: Pt will identify x3 preferred leisure tasks and initiate with MIN cues Additional ADL Goal #2: Pt will complete medication mgmt assessment  Plan Discharge plan remains appropriate;Frequency remains appropriate    Co-evaluation                 AM-PAC OT "6 Clicks" Daily Activity     Outcome Measure   Help from another person eating meals?: None Help from another person taking care of personal grooming?: A Little Help from another person toileting, which includes using toliet, bedpan, or urinal?: A Little Help from another person bathing (including washing, rinsing, drying)?: A Little Help from another person to put on and taking off regular upper body clothing?: A Little Help from another person to put on and taking off regular lower body clothing?: A Little 6 Click Score: 19    End of Session Equipment Utilized During Treatment: Rolling walker (2 wheels)  OT Visit Diagnosis: Other abnormalities of gait and mobility (R26.89);Muscle weakness (generalized) (M62.81)   Activity Tolerance Patient tolerated treatment well   Patient Left in chair (in day room)   Nurse Communication          Time: 1740-8144 OT Time Calculation (min): 19 min  Charges: OT General Charges $OT Visit: 1 Visit OT Treatments $Self Care/Home Management : 8-22 mins  degenerative joint disease

## 2022-07-09 NOTE — Progress Notes (Signed)
Soiled dressing removed.  New foam dressing and ordered cream applied to patient's wound.

## 2022-07-09 NOTE — Progress Notes (Signed)
[facility] MD Progress Note  07/09/2022 10:21 AM John [name]  MRN:  [id number] Subjective: John [name] is seen on rounds.  Staff note that his urine has a strong smell and is dark.  We will go ahead and check a UA.  He has been compliant with his medication without side effects.  He is very nonverbal and almost catatonic.  Dr. Weber Cooks can put him on the ECT schedule when he has room.  John [name] did well with ECT last time.  Not sure he is getting his medications at home.  Principal Problem: Major depressive disorder, recurrent severe without psychotic features ([place]) Diagnosis: Principal Problem:   Major depressive disorder, recurrent severe without psychotic features ([place]) Active Problems:   Pressure injury of skin  Total Time spent with patient: 15 minutes  Past Psychiatric History: Patient has a history of past depression repeated episodes throughout his life.  1 prior suicide attempt.  Was admitted to the hospital in mid May here with depression.  Was not showing significant improvement over time and was referred for ECT.  Patient received a few ECT treatments which generally appeared to be tolerated well. He did develop an arrhythmia which resolved.  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 06/2015 CT Head: abnl thickening of mid-dist R cervical ICA w/o narrowing - ? vasculitis and thrombosed/healing dissection; b. 01/2021 Carotid U/S: 1-39% bilat ICA stenoses.   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 Age 65 - struck in head w/ baseball --> s/p craniotomy.   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 Approx 2015 - eval in [place] - conservatively managed.    Past Surgical History:  Procedure Laterality Date   ANKLE ARTHROSCOPY Left    fracture   CHOLECYSTECTOMY  2007   [place]   left frontal craniotomy for epidural hematoma   HIP ARTHROPLASTY Right 04/07/2022   Procedure: ARTHROPLASTY BIPOLAR HIP (HEMIARTHROPLASTY);  Surgeon: Earnestine Leys, MD;  Location: [facility] ORS;  Service: Orthopedics;  Laterality: Right;   INSERTION OF MESH  01/02/2022   Procedure: INSERTION OF MESH;  Surgeon: Herbert Pun, MD;  Location: [facility] ORS;  Service: General;;   LIGAMENT REPAIR Right    thumb   RETINAL DETACHMENT SURGERY     TREATMENT FISTULA ANAL  2006   Family History:  Family History  Problem Relation Age of Onset   Osteoporosis Mother    Depression Mother    Prostate cancer Father    Hypertension Father    Diabetes Brother    Kidney disease Neg Hx     Social History:  Social History   Substance and Sexual Activity  Alcohol Use Not Currently     Social History   Substance and Sexual Activity  Drug Use No    Social History   Socioeconomic  History   Marital status: Married    Spouse name: [name]   Number of children: 2   Years of education: Not on file   Highest education level: Master's degree (e.g., MA, MS, MEng, MEd, MSW, MBA)  Occupational History   Not on file  Tobacco Use   Smoking status: Never   Smokeless tobacco: Never  Vaping Use   Vaping Use: Never used  Substance and Sexual Activity   Alcohol use: Not Currently   Drug use: No   Sexual activity: Yes    Birth control/protection: None  Other Topics Concern   Not on file  Social History Narrative   Live at home with wife.    Social Determinants of Health   Financial Resource Strain: Not on file  Food Insecurity: Not on file  Transportation Needs: Not on file  Physical Activity: Not on file   Stress: Not on file  Social Connections: Not on file   Additional Social History:                         Sleep: Good  Appetite:  Fair  Current Medications: Current Facility-Administered Medications  Medication Dose Route Frequency Provider Last Rate Last Admin   acetaminophen (TYLENOL) tablet 650 mg  650 mg Oral Q6H PRN Patrecia Pour, NP   650 mg at 07/08/22 2140   alum & mag hydroxide-simeth (MAALOX/MYLANTA) 200-200-20 MG/5ML suspension 30 mL  30 mL Oral Q4H PRN Patrecia Pour, NP       ARIPiprazole (ABILIFY) tablet 10 mg  10 mg Oral QPC breakfast Parks Ranger, DO   10 mg at 07/09/22 0818   ascorbic acid (VITAMIN C) tablet 500 mg  500 mg Oral BID Parks Ranger, DO   500 mg at 07/09/22 9242   aspirin EC tablet 81 mg  81 mg Oral Daily Patrecia Pour, NP   81 mg at 07/09/22 0818   buPROPion (WELLBUTRIN XL) 24 hr tablet 300 mg  300 mg Oral Daily Patrecia Pour, NP   300 mg at 07/09/22 0818   docusate sodium (COLACE) capsule 100 mg  100 mg Oral BID Patrecia Pour, NP   100 mg at 07/09/22 0818   insulin aspart (novoLOG) injection 0-6 Units  0-6 Units Subcutaneous TID WC Parks Ranger, DO   1 Units at 07/06/22 1154   liver oil-zinc oxide (DESITIN) 40 % ointment   Topical BID Parks Ranger, DO   Given at 07/07/22 2111   magnesium hydroxide (MILK OF MAGNESIA) suspension 30 mL  30 mL Oral Daily PRN Patrecia Pour, NP       methocarbamol (ROBAXIN) tablet 500 mg  500 mg Oral Q6H PRN Patrecia Pour, NP       metoprolol tartrate (LOPRESSOR) tablet 12.5 mg  12.5 mg Oral BID Parks Ranger, DO   12.5 mg at 07/09/22 0818   multivitamin with minerals tablet 1 tablet  1 tablet Oral Daily Parks Ranger, DO   1 tablet at 07/09/22 0818   polyethylene glycol (MIRALAX / GLYCOLAX) packet 17 g  17 g Oral Daily PRN Patrecia Pour, NP       protein supplement (ENSURE MAX) liquid  11 oz Oral BID Parks Ranger, DO   11 oz at  07/09/22 1018   senna (SENOKOT) tablet 8.6 mg  1 tablet Oral BID Patrecia Pour, NP   8.6 mg at 07/09/22 0818   sertraline (  ZOLOFT) tablet 50 mg  50 mg Oral QHS Patrecia Pour, NP   50 mg at 07/08/22 2140   tamsulosin (FLOMAX) capsule 0.4 mg  0.4 mg Oral QPC supper Patrecia Pour, NP   0.4 mg at 07/08/22 1700   traZODone (DESYREL) tablet 50 mg  50 mg Oral QHS PRN Patrecia Pour, NP   50 mg at 07/07/22 2112    Lab Results:  Results for orders placed or performed during the hospital encounter of 07/04/22 (from the past 48 hour(s))  Glucose, capillary     Status: Abnormal   Collection Time: 07/07/22 11:39 AM  Result Value Ref Range   Glucose-Capillary 108 (H) 70 - 99 mg/dL    Comment: Glucose reference range applies only to samples taken after fasting for at least 8 hours.  Glucose, capillary     Status: Abnormal   Collection Time: 07/07/22  3:52 PM  Result Value Ref Range   Glucose-Capillary 119 (H) 70 - 99 mg/dL    Comment: Glucose reference range applies only to samples taken after fasting for at least 8 hours.  Glucose, capillary     Status: Abnormal   Collection Time: 07/07/22  7:48 PM  Result Value Ref Range   Glucose-Capillary 124 (H) 70 - 99 mg/dL    Comment: Glucose reference range applies only to samples taken after fasting for at least 8 hours.  Glucose, capillary     Status: Abnormal   Collection Time: 07/08/22  8:01 AM  Result Value Ref Range   Glucose-Capillary 105 (H) 70 - 99 mg/dL    Comment: Glucose reference range applies only to samples taken after fasting for at least 8 hours.  Glucose, capillary     Status: Abnormal   Collection Time: 07/08/22 11:48 AM  Result Value Ref Range   Glucose-Capillary 103 (H) 70 - 99 mg/dL    Comment: Glucose reference range applies only to samples taken after fasting for at least 8 hours.  Glucose, capillary     Status: Abnormal   Collection Time: 07/08/22  4:19 PM  Result Value Ref Range   Glucose-Capillary 110 (H) 70 - 99 mg/dL     Comment: Glucose reference range applies only to samples taken after fasting for at least 8 hours.  Glucose, capillary     Status: None   Collection Time: 07/08/22  8:08 PM  Result Value Ref Range   Glucose-Capillary 98 70 - 99 mg/dL    Comment: Glucose reference range applies only to samples taken after fasting for at least 8 hours.   Comment 1 Notify RN   Glucose, capillary     Status: Abnormal   Collection Time: 07/09/22  7:48 AM  Result Value Ref Range   Glucose-Capillary 106 (H) 70 - 99 mg/dL    Comment: Glucose reference range applies only to samples taken after fasting for at least 8 hours.    Blood Alcohol level:  Lab Results  Component Value Date   ETH <10 07/02/2022   ETH <10 94/76/5465    Metabolic Disorder Labs: Lab Results  Component Value Date   HGBA1C 5.6 03/19/2022   MPG 114.02 03/19/2022   No results found for: "PROLACTIN" Lab Results  Component Value Date   CHOL 131 03/19/2022   TRIG 92 03/19/2022   HDL 33 (L) 03/19/2022   CHOLHDL 4.0 03/19/2022   VLDL 18 03/19/2022   [place] 80 03/19/2022    Physical Findings: AIMS:  , ,  ,  ,  CIWA:    COWS:     Musculoskeletal: Strength & Muscle Tone: within normal limits Gait & Station: unable to stand Patient leans: N/A  Psychiatric Specialty Exam:  Presentation  General Appearance: Appropriate for Environment; Casual  Eye Contact:Fleeting  Speech:Garbled  Speech Volume:Decreased  Handedness:Right   Mood and Affect  Mood:Anxious; Depressed  Affect:Flat   Thought Process  Thought Processes:Linear; Coherent; Goal Directed  Descriptions of Associations:Loose  Orientation:Partial  Thought Content:Scattered  History of Schizophrenia/Schizoaffective disorder:No  Duration of Psychotic Symptoms:No data recorded Hallucinations:No data recorded Ideas of Reference:None  Suicidal Thoughts:No data recorded Homicidal Thoughts:No data recorded  Sensorium  Memory:Recent Poor; Remote  Poor  Judgment:Poor  Insight:Fair   Executive Functions  Concentration:Fair  Attention Span:Poor  Recall:Poor  Fund of Knowledge:Poor  Language:Poor   Psychomotor Activity  Psychomotor Activity:No data recorded  Assets  Assets:Financial Resources/Insurance   Sleep  Sleep:No data recorded   Physical Exam: Physical Exam Vitals and nursing note reviewed.  Constitutional:      Appearance: Normal appearance. He is normal weight.  Neurological:     General: No focal deficit present.     Mental Status: He is alert and oriented to person, place, and time.  Psychiatric:        Attention and Perception: Attention and perception normal.        Mood and Affect: Mood is depressed. Affect is flat.        Speech: He is noncommunicative. Speech is delayed.        Behavior: Behavior is slowed. Behavior is cooperative.        Thought Content: Thought content normal.        Cognition and Memory: Cognition is impaired. Memory is impaired.        Judgment: Judgment normal.    Review of Systems  Constitutional: Negative.   HENT: Negative.    Eyes: Negative.   Respiratory: Negative.    Cardiovascular: Negative.   Gastrointestinal: Negative.   Genitourinary: Negative.   Musculoskeletal: Negative.   Skin: Negative.   Neurological: Negative.   Endo/Heme/Allergies: Negative.   Psychiatric/Behavioral: Negative.     Blood pressure 124/82, pulse 61, temperature (!) 97.5 F (36.4 C), temperature source Oral, resp. rate 18, height '6\' 2"'$  (1.88 m), weight 70.8 kg, SpO2 100 %. Body mass index is 20.03 kg/m.   Treatment Plan Summary: Daily contact with patient to assess and evaluate symptoms and progress in treatment, Medication management, and Plan urinalysis and continue current medications.  [place], DO 07/09/2022, 10:21 AM

## 2022-07-09 NOTE — Progress Notes (Signed)
Patient has been toileted every 2 hours.

## 2022-07-09 NOTE — BH Assessment (Signed)
1910 Received patient sitting in the day room alert and oriented to self. Patient has a one on one staff for safety. Will continue to monitor patient for safety.  Patient complain of sacral pain  and received Tylenol 650 mg for  a pain level of 6. He was also repositioned to relieve pain. His sacral dressing is intact and is due for changing on 07/10/22. Patient is being turned side to side every two hours and gotten up to use the bath room.  2330 Patient nodes to pain is better he is not able to give a specific number at this time.   0530 Patient has been turned every two hours from side to side to prevent further sacral breakdown. One to one staffing  still at bedside.  0630 Patient has been incontinent x5 and has been toileted x5 and he has voided. Urine has a strong smell and is dark yellow in the diaper. Will continue  to monitor.

## 2022-07-09 NOTE — Plan of Care (Signed)
  Problem: Safety: Goal: Periods of time without injury will increase Outcome: Progressing   Problem: Coping: Goal: Coping ability will improve Outcome: Not Progressing   Problem: Coping: Goal: Will verbalize feelings Outcome: Not Progressing

## 2022-07-09 NOTE — Progress Notes (Addendum)
Patient alert and oriented x 2. Patient with sad, flat affect.   Patient did not respond to assessment questions, but was making eye contact with this Probation officer.   Patient had a good appetite and ate 90% of his breakfast.    Scheduled medications given as ordered.  No adverse reactions noted.    Patient has been up walking with nursing staff and PT.    15 minute checks in place.  Patient remains safe on the unit.   Patient is present in the milieu.  Minimal interaction with peers.

## 2022-07-09 NOTE — Group Note (Signed)
[facility] LCSW Group Therapy Note   Group Date: 07/09/2022 Start Time: 1330 End Time: 1430  Type of Therapy/Topic:  Group Therapy:  Feelings about Diagnosis  Participation Level:  Minimal   Description of Group:    This group will allow patients to explore their thoughts and feelings about diagnoses they have received. Patients will be guided to explore their level of understanding and acceptance of these diagnoses. Facilitator will encourage patients to process their thoughts and feelings about the reactions of others to their diagnosis, and will guide patients in identifying ways to discuss their diagnosis with significant others in their lives. This group will be process-oriented, with patients participating in exploration of their own experiences as well as giving and receiving support and challenge from other group members.   Therapeutic Goals: 1. Patient will demonstrate understanding of diagnosis as evidence by identifying two or more symptoms of the disorder:  2. Patient will be able to express two feelings regarding the diagnosis 3. Patient will demonstrate ability to communicate their needs through discussion and/or role plays  Summary of Patient Progress: Patient did engage in group, though was soft spoke and difficult to understand at times due to a low voice.  Patient was able to follow along and process with group.  Patient displayed fair insight.    Therapeutic Modalities:   Cognitive Behavioral Therapy Brief Therapy Feelings Identification    Rozann Lesches, LCSW

## 2022-07-09 NOTE — BH Assessment (Signed)
1905 Received patient sitting in the day room visiting with his wife. He is alert and oriented x3 and engaging in conversation. He remains a one on one staff for safety.   2130 Patient is more alert than last HS shift and is appropriate with answers and questions. He has initiated conversation and appear to be excited about interaction. He has denied SI/HI, A/V hallucinations, depression. He endorsed sacral pain with a pain level of 5. He received Tylenol 650 mg.   0030 Patient is being turned every two hours, he has gotten up to go to the bathroom all most every 30 minutes. He continues to be appropriate in speech.  He has a one on one staff member at bedside.  0630 Patient is resting quietly in bed at this time. One to one staff at bedside.

## 2022-07-10 DIAGNOSIS — F332 Major depressive disorder, recurrent severe without psychotic features: Secondary | ICD-10-CM | POA: Diagnosis not present

## 2022-07-10 LAB — GLUCOSE, CAPILLARY
Glucose-Capillary: 91 mg/dL (ref 70–99)
Glucose-Capillary: 101 mg/dL — ABNORMAL HIGH (ref 70–99)
Glucose-Capillary: 159 mg/dL — ABNORMAL HIGH (ref 70–99)
Glucose-Capillary: 88 mg/dL (ref 70–99)

## 2022-07-10 NOTE — Plan of Care (Signed)
Pt remains very flat, depressed, no evidence of learning education.

## 2022-07-10 NOTE — Progress Notes (Signed)
Pt ambulated in hallway 50 ft with walker and +1 standby assist. Pt tolerated well.

## 2022-07-10 NOTE — BH IP Treatment Plan (Signed)
Interdisciplinary Treatment and Diagnostic Plan Update  07/10/2022 Time of Session: 8:45 AM John [name] MRN: [id number]  Principal Diagnosis: Major depressive disorder, recurrent severe without psychotic features ([place])  Secondary Diagnoses: Principal Problem:   Major depressive disorder, recurrent severe without psychotic features ([place]) Active Problems:   Pressure injury of skin   Current Medications:  Current Facility-Administered Medications  Medication Dose Route Frequency Provider Last Rate Last Admin   acetaminophen (TYLENOL) tablet 650 mg  650 mg Oral Q6H PRN Patrecia Pour, NP   650 mg at 07/09/22 2145   alum & mag hydroxide-simeth (MAALOX/MYLANTA) 200-200-20 MG/5ML suspension 30 mL  30 mL Oral Q4H PRN Patrecia Pour, NP       ARIPiprazole (ABILIFY) tablet 10 mg  10 mg Oral QPC breakfast Parks Ranger, DO   10 mg at 07/10/22 6283   ascorbic acid (VITAMIN C) tablet 500 mg  500 mg Oral BID Parks Ranger, DO   500 mg at 07/10/22 1517   aspirin EC tablet 81 mg  81 mg Oral Daily Patrecia Pour, NP   81 mg at 07/10/22 6160   buPROPion (WELLBUTRIN XL) 24 hr tablet 300 mg  300 mg Oral Daily Patrecia Pour, NP   300 mg at 07/10/22 7371   docusate sodium (COLACE) capsule 100 mg  100 mg Oral BID Patrecia Pour, NP   100 mg at 07/10/22 0626   insulin aspart (novoLOG) injection 0-6 Units  0-6 Units Subcutaneous TID WC Parks Ranger, DO   1 Units at 07/06/22 1154   liver oil-zinc oxide (DESITIN) 40 % ointment   Topical BID Parks Ranger, DO   1 Application at 94/85/46 2703   magnesium hydroxide (MILK OF MAGNESIA) suspension 30 mL  30 mL Oral Daily PRN Patrecia Pour, NP       methocarbamol (ROBAXIN) tablet 500 mg  500 mg Oral Q6H PRN Patrecia Pour, NP       metoprolol tartrate (LOPRESSOR) tablet 12.5 mg  12.5 mg Oral BID Parks Ranger, DO   12.5 mg at 07/10/22 5009   multivitamin with minerals tablet 1 tablet  1 tablet Oral Daily Parks Ranger, DO   1 tablet at 07/10/22 3818   polyethylene glycol (MIRALAX / GLYCOLAX) packet 17 g  17 g Oral Daily PRN Patrecia Pour, NP       protein supplement (ENSURE MAX) liquid  11 oz Oral BID Parks Ranger, DO   11 oz at 07/09/22 2147   senna (SENOKOT) tablet 8.6 mg  1 tablet Oral BID Patrecia Pour, NP   8.6 mg at 07/10/22 2993   sertraline (ZOLOFT) tablet 50 mg  50 mg Oral QHS Patrecia Pour, NP   50 mg at 07/09/22 2144   tamsulosin (FLOMAX) capsule 0.4 mg  0.4 mg Oral QPC supper Patrecia Pour, NP   0.4 mg at 07/09/22 1701   traZODone (DESYREL) tablet 50 mg  50 mg Oral QHS PRN Patrecia Pour, NP   50 mg at 07/07/22 2112   PTA Medications: Medications Prior to Admission  Medication Sig Dispense Refill Last Dose   aspirin EC 81 MG tablet Take 81 mg by mouth daily.      buPROPion (WELLBUTRIN XL) 300 MG 24 hr tablet Take 1 tablet by mouth daily.      docusate sodium (COLACE) 100 MG capsule Take 1 capsule (100 mg total) by mouth 2 (two) times daily. 10 capsule 0  methocarbamol (ROBAXIN) 500 MG tablet Take 1 tablet (500 mg total) by mouth every 6 (six) hours as needed for muscle spasms. 30 tablet 0    metoprolol tartrate (LOPRESSOR) 25 MG tablet Take 1 tablet (25 mg total) by mouth 2 (two) times daily. 60 tablet 3    polyethylene glycol (MIRALAX / GLYCOLAX) 17 g packet Take 17 g by mouth daily as needed. 14 each 0    senna (SENOKOT) 8.6 MG TABS tablet Take 1 tablet (8.6 mg total) by mouth 2 (two) times daily. 120 tablet 0    sertraline (ZOLOFT) 50 MG tablet Take 1 tablet (50 mg total) by mouth at bedtime. 30 tablet 0    sertraline (ZOLOFT) 50 MG tablet Take 1 tablet (50 mg total) by mouth daily. 30 tablet 1    tamsulosin (FLOMAX) 0.4 MG CAPS capsule Take 1 capsule (0.4 mg total) by mouth daily after supper. 30 capsule 3    traZODone (DESYREL) 50 MG tablet Take 1 tablet (50 mg total) by mouth at bedtime as needed for sleep. 30 tablet 3     Patient Stressors: Marital or  family conflict   Medication change or noncompliance    Patient Strengths: Scientist, research (life sciences)  Supportive family/friends   Treatment Modalities: Medication Management, Group therapy, Case management,  1 to 1 session with clinician, Psychoeducation, Recreational therapy.   Physician Treatment Plan for Primary Diagnosis: Major depressive disorder, recurrent severe without psychotic features ([place]) Long Term Goal(s): Improvement in symptoms so as ready for discharge   Short Term Goals: Ability to identify changes in lifestyle to reduce recurrence of condition will improve Ability to verbalize feelings will improve Ability to disclose and discuss suicidal ideas Ability to demonstrate self-control will improve Ability to identify and develop effective coping behaviors will improve Ability to maintain clinical measurements within normal limits will improve Compliance with prescribed medications will improve Ability to identify triggers associated with substance abuse/mental health issues will improve  Medication Management: Evaluate patient's response, side effects, and tolerance of medication regimen.  Therapeutic Interventions: 1 to 1 sessions, Unit Group sessions and Medication administration.  Evaluation of Outcomes: Progressing  Physician Treatment Plan for Secondary Diagnosis: Principal Problem:   Major depressive disorder, recurrent severe without psychotic features ([place]) Active Problems:   Pressure injury of skin  Long Term Goal(s): Improvement in symptoms so as ready for discharge   Short Term Goals: Ability to identify changes in lifestyle to reduce recurrence of condition will improve Ability to verbalize feelings will improve Ability to disclose and discuss suicidal ideas Ability to demonstrate self-control will improve Ability to identify and develop effective coping behaviors will improve Ability to maintain clinical measurements within normal limits will  improve Compliance with prescribed medications will improve Ability to identify triggers associated with substance abuse/mental health issues will improve     Medication Management: Evaluate patient's response, side effects, and tolerance of medication regimen.  Therapeutic Interventions: 1 to 1 sessions, Unit Group sessions and Medication administration.  Evaluation of Outcomes: Progressing   RN Treatment Plan for Primary Diagnosis: Major depressive disorder, recurrent severe without psychotic features ([place]) Long Term Goal(s): Knowledge of disease and therapeutic regimen to maintain health will improve  Short Term Goals: Ability to demonstrate self-control, Ability to participate in decision making will improve, Ability to verbalize feelings will improve, Ability to disclose and discuss suicidal ideas, Ability to identify and develop effective coping behaviors will improve, and Compliance with prescribed medications will improve  Medication Management: RN will administer medications as  ordered by provider, will assess and evaluate patient's response and provide education to patient for prescribed medication. RN will report any adverse and/or side effects to prescribing provider.  Therapeutic Interventions: 1 on 1 counseling sessions, Psychoeducation, Medication administration, Evaluate responses to treatment, Monitor vital signs and CBGs as ordered, Perform/monitor CIWA, COWS, AIMS and Fall Risk screenings as ordered, Perform wound care treatments as ordered.  Evaluation of Outcomes: Progressing   LCSW Treatment Plan for Primary Diagnosis: Major depressive disorder, recurrent severe without psychotic features ([place]) Long Term Goal(s): Safe transition to appropriate next level of care at discharge, Engage patient in therapeutic group addressing interpersonal concerns.  Short Term Goals: Engage patient in aftercare planning with referrals and resources, Increase social support, Increase  ability to appropriately verbalize feelings, Increase emotional regulation, Facilitate acceptance of mental health diagnosis and concerns, and Increase skills for wellness and recovery  Therapeutic Interventions: Assess for all discharge needs, 1 to 1 time with Social worker, Explore available resources and support systems, Assess for adequacy in community support network, Educate family and significant other(s) on suicide prevention, Complete Psychosocial Assessment, Interpersonal group therapy.  Evaluation of Outcomes: Progressing   Progress in Treatment: Attending groups: Yes. Participating in groups: Yes. Taking medication as prescribed: Yes. Toleration medication: Yes. Family/Significant other contact made: No, will contact:  once permission is given. Patient understands diagnosis: Yes. Discussing patient identified problems/goals with staff: Yes. Medical problems stabilized or resolved: No. Denies suicidal/homicidal ideation: Yes. Issues/concerns per patient self-inventory: No. Other: none  New problem(s) identified: No, Describe:  none Update 07/10/2022:  No changes at this time.    New Short Term/Long Term Goal(s):  medication management for mood stabilization; elimination of SI thoughts; development of comprehensive mental wellness plan.  Update 07/10/2022:  No changes at this time.    Patient Goals:  "get better"Update 07/10/2022:  No changes at this time.    Discharge Plan or Barriers: CSW to assist patient in development of appropriate discharge plans. Patient struggled with speech during treatment team. Update 07/10/2022:  Patient reports plans to return home.  Patient reports that he would like to continue with his current mental health provider.  Patient continues to work with PT/OT and has plans to begin ECT.  Changes to discharge treatment pending recommendations of other disciplines working with the patient.    Reason for Continuation of Hospitalization:  Anxiety Depression Medical Issues Medication stabilization   Estimated Length of Stay:  1-7 days Update 07/10/2022:  No changes at this time.     Last 3 [place] Suicide Severity Risk Score: Flowsheet Row Admission (Current) from 07/04/2022 in [place] ED from 07/03/2022 in [place] Office Visit from 06/11/2022 in confidential department  C-SSRS RISK CATEGORY No Risk No Risk Error: Q3, 4, or 5 should not be populated when Q2 is No       Last PHQ 2/9 Scores:    06/11/2022    2:20 PM 05/20/2022    1:11 PM 12/21/2021    8:59 AM  Depression screen PHQ 2/9  Decreased Interest 3 1 0  Down, Depressed, Hopeless 1 1 0  PHQ - 2 Score 4 2 0  Altered sleeping 1 1   Tired, decreased energy 1 2   Change in appetite 2 1   Feeling bad or failure about yourself  1 1   Trouble concentrating 2 2   Moving slowly or fidgety/restless 2 2   Suicidal thoughts 0 0   PHQ-9 Score 13  11   Difficult doing work/chores Somewhat difficult      Scribe for Treatment Team: Rozann Lesches, Marlinda Mike 07/10/2022 9:09 AM

## 2022-07-10 NOTE — Progress Notes (Signed)
Patient presents very depressed, flat affect. Javis continues to have very soft, slow speech, although appears to be more spontaneous with less thought blocking noted today from prior shifts with this patient. He reports he slept well and denies any pain. Patient denies any SI or HI at this time. NO signs pt is responding to internal stimuli, however patient continues to require prompting and assistance to move, and perform adls. Patient does have sacral wounds /skin breakdown.  This Probation officer and NA asissted patient into shower this am, with shower chair. Patient skin cleansed and patient bathed and barrier cream/zinc applied after wounds assessed. There remains three wounds to buttocks, one directly underneath skin folds/buttocks and largest to right buttocks measuring in 2inx1in.  Patient sacral dressing was changed and a new dressing applied after skin cleansed and barrier cream applied.  Patient does have intermittent incontinence with depends but is able to void when toileted.  Patients wife also phoned to express concerns and questioned regarding plan of care and patients progress. Writer updated the family on the patient and relayed to MD family request for phone call.  Patient is safe, will con't to monitor.

## 2022-07-10 NOTE — Group Note (Signed)
[place] LCSW Group Therapy Note   Group Date: 07/10/2022 Start Time: 1330 End Time: 1415   Type of Therapy/Topic:  Group Therapy:  Emotion Regulation  Participation Level:  Minimal   Mood:  Description of Group:    The purpose of this group is to assist patients in learning to regulate negative emotions and experience positive emotions. Patients will be guided to discuss ways in which they have been vulnerable to their negative emotions. These vulnerabilities will be juxtaposed with experiences of positive emotions or situations, and patients challenged to use positive emotions to combat negative ones. Special emphasis will be placed on coping with negative emotions in conflict situations, and patients will process healthy conflict resolution skills.  Therapeutic Goals: Patient will identify two positive emotions or experiences to reflect on in order to balance out negative emotions:  Patient will label two or more emotions that they find the most difficult to experience:  Patient will be able to demonstrate positive conflict resolution skills through discussion or role plays:   Summary of Patient Progress: Patient was present in group.  Patient appeared attentive in group, though did not engage in group often.  CSW notes that patient was able to express himself more clearly and well thought out than previous group.   Therapeutic Modalities:   Cognitive Behavioral Therapy Feelings Identification Dialectical Behavioral Therapy   Rozann Lesches, LCSW

## 2022-07-10 NOTE — Plan of Care (Signed)
1:1 Note Problem: Education: Goal: Knowledge of General Education information will improve Description: Including pain rating scale, medication(s)/side effects and non-pharmacologic comfort measures Outcome: Progressing   Problem: Health Behavior/Discharge Planning: Goal: Ability to manage health-related needs will improve Outcome: Progressing   Problem: Clinical Measurements: Goal: Ability to maintain clinical measurements within normal limits will improve Outcome: Progressing  Patient compliant with treatment plan. C/O scrum pain 4/10 prn Tylenol given. Patient encouraged to walk every hour while awake.  Pt continued to be observed on 1:1 While in bed for risk of falls r/t history of falling.   Support and encouragement provided.

## 2022-07-10 NOTE — Progress Notes (Signed)
[facility] MD Progress Note  07/10/2022 12:18 PM John [name]  MRN:  [id number] Subjective: John [name] is seen on rounds.  He speaks very softly but he was goal-directed in his thought process.  When I asked him how he was doing he said not good.  I asked him if he was depressed or anxious and he said anxious.  He was able to tell me that he slept well.  That is unusual.  He does not do well with benzos because he is always falling.  The wife has passed on to social work.  She would like neuropsychiatry to see him but he really needs treatment for depression and then may be follow-up with a psychologist for neuropsych testing.  He does not have any focal deficits or any other reason to see a neurologist.  His last CT which was done in June of this year, of his head, which showed nothing acute and I do not suspect anything acute now, but he did have small vessel disease and diffuse atrophy.  His UA basically came back unchanged.  Principal Problem: Major depressive disorder, recurrent severe without psychotic features ([place]) Diagnosis: Principal Problem:   Major depressive disorder, recurrent severe without psychotic features ([place]) Active Problems:   Pressure injury of skin  Total Time spent with patient: 15 minutes  Past Psychiatric History:  Patient has a history of past depression repeated episodes throughout his life.  1 prior suicide attempt.  Was admitted to the hospital in mid May here with depression.  Was not showing significant improvement over time and was referred for ECT.  Patient received a few ECT treatments which generally appeared to be tolerated well. He did develop an arrhythmia which resolved.  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 06/2015 CT Head: abnl thickening of mid-dist R cervical ICA w/o narrowing - ? vasculitis and  thrombosed/healing dissection; b. 01/2021 Carotid U/S: 1-39% bilat ICA stenoses.   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 Age 79 - struck in head w/ baseball --> s/p craniotomy.   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 Approx 2015 - eval in [place] - conservatively managed.    Past Surgical History:  Procedure Laterality Date   ANKLE ARTHROSCOPY Left    fracture   CHOLECYSTECTOMY  2007   [place]   left frontal craniotomy for epidural hematoma   HIP ARTHROPLASTY Right 04/07/2022   Procedure: ARTHROPLASTY BIPOLAR HIP (HEMIARTHROPLASTY);  Surgeon: Earnestine Leys, MD;  Location: [facility] ORS;  Service: Orthopedics;  Laterality: Right;   INSERTION OF MESH  01/02/2022   Procedure: INSERTION OF MESH;  Surgeon: Herbert Pun, MD;  Location: [facility] ORS;  Service: General;;   LIGAMENT REPAIR Right    thumb   RETINAL DETACHMENT SURGERY     TREATMENT FISTULA ANAL  2006   Family History:  Family History  Problem Relation Age of Onset   Osteoporosis Mother    Depression Mother    Prostate cancer Father    Hypertension Father    Diabetes Brother    Kidney disease Neg Hx     Social History:  Social History   Substance and Sexual Activity  Alcohol Use Not Currently     Social History   Substance and Sexual Activity  Drug Use No    Social History   Socioeconomic History   Marital status: Married    Spouse name: [name]   Number of children: 2   Years of education: Not on file   Highest education level: Master's degree (e.g., MA, MS, MEng, MEd, MSW, MBA)  Occupational History   Not on file  Tobacco Use   Smoking status: Never    Smokeless tobacco: Never  Vaping Use   Vaping Use: Never used  Substance and Sexual Activity   Alcohol use: Not Currently   Drug use: No   Sexual activity: Yes    Birth control/protection: None  Other Topics Concern   Not on file  Social History Narrative   Live at home with wife.    Social Determinants of Health   Financial Resource Strain: Not on file  Food Insecurity: Food Insecurity Present (07/09/2022)   Hunger Vital Sign    Worried About Running Out of Food in the Last Year: Sometimes true    Ran Out of Food in the Last Year: Sometimes true  Transportation Needs: No Transportation Needs (07/09/2022)   PRAPARE - Hydrologist (Medical): No    Lack of Transportation (Non-Medical): No  Physical Activity: Not on file  Stress: Not on file  Social Connections: Not on file   Additional Social History:                         Sleep: Good  Appetite:  Fair  Current Medications: Current Facility-Administered Medications  Medication Dose Route Frequency Provider Last Rate Last Admin   acetaminophen (TYLENOL) tablet 650 mg  650 mg Oral Q6H PRN Patrecia Pour, NP   650 mg at 07/09/22 2145   alum & mag hydroxide-simeth (MAALOX/MYLANTA) 200-200-20 MG/5ML suspension 30 mL  30 mL Oral Q4H PRN Patrecia Pour, NP       ARIPiprazole (ABILIFY) tablet 10 mg  10 mg Oral QPC breakfast Parks Ranger, DO   10 mg at 07/10/22 4431   ascorbic acid (VITAMIN C) tablet 500 mg  500 mg Oral BID Parks Ranger, DO   500 mg at 07/10/22 5400   aspirin EC tablet 81 mg  81 mg Oral Daily Patrecia Pour, NP   81 mg at 07/10/22 8676   buPROPion (WELLBUTRIN XL) 24 hr tablet 300 mg  300 mg Oral Daily Patrecia Pour, NP   300 mg at 07/10/22 1950   docusate sodium (COLACE) capsule 100 mg  100 mg Oral BID Patrecia Pour, NP   100 mg at 07/10/22 9326   insulin aspart (novoLOG) injection 0-6 Units  0-6 Units Subcutaneous TID WC Parks Ranger, DO    1 Units at 07/06/22 1154   liver oil-zinc oxide (DESITIN) 40 % ointment   Topical BID Parks Ranger, DO   1 Application at 71/24/58 0998   magnesium hydroxide (MILK OF MAGNESIA) suspension 30 mL  30 mL Oral Daily PRN Patrecia Pour,  NP       methocarbamol (ROBAXIN) tablet 500 mg  500 mg Oral Q6H PRN Patrecia Pour, NP       metoprolol tartrate (LOPRESSOR) tablet 12.5 mg  12.5 mg Oral BID Parks Ranger, DO   12.5 mg at 07/10/22 2426   multivitamin with minerals tablet 1 tablet  1 tablet Oral Daily Parks Ranger, DO   1 tablet at 07/10/22 8341   polyethylene glycol (MIRALAX / GLYCOLAX) packet 17 g  17 g Oral Daily PRN Patrecia Pour, NP       protein supplement (ENSURE MAX) liquid  11 oz Oral BID Parks Ranger, DO   11 oz at 07/10/22 1000   senna (SENOKOT) tablet 8.6 mg  1 tablet Oral BID Patrecia Pour, NP   8.6 mg at 07/10/22 9622   sertraline (ZOLOFT) tablet 50 mg  50 mg Oral QHS Patrecia Pour, NP   50 mg at 07/09/22 2144   tamsulosin (FLOMAX) capsule 0.4 mg  0.4 mg Oral QPC supper Patrecia Pour, NP   0.4 mg at 07/09/22 1701   traZODone (DESYREL) tablet 50 mg  50 mg Oral QHS PRN Patrecia Pour, NP   50 mg at 07/07/22 2112    Lab Results:  Results for orders placed or performed during the hospital encounter of 07/04/22 (from the past 48 hour(s))  Glucose, capillary     Status: Abnormal   Collection Time: 07/08/22  4:19 PM  Result Value Ref Range   Glucose-Capillary 110 (H) 70 - 99 mg/dL    Comment: Glucose reference range applies only to samples taken after fasting for at least 8 hours.  Glucose, capillary     Status: None   Collection Time: 07/08/22  8:08 PM  Result Value Ref Range   Glucose-Capillary 98 70 - 99 mg/dL    Comment: Glucose reference range applies only to samples taken after fasting for at least 8 hours.   Comment 1 Notify RN   Glucose, capillary     Status: Abnormal   Collection Time: 07/09/22  7:48 AM  Result Value Ref  Range   Glucose-Capillary 106 (H) 70 - 99 mg/dL    Comment: Glucose reference range applies only to samples taken after fasting for at least 8 hours.  Urinalysis, Routine w reflex microscopic Urine, Clean Catch     Status: Abnormal   Collection Time: 07/09/22 10:19 AM  Result Value Ref Range   Color, Urine AMBER (A) YELLOW    Comment: BIOCHEMICALS MAY BE AFFECTED BY COLOR   APPearance HAZY (A) CLEAR   Specific Gravity, Urine 1.028 1.005 - 1.030   pH 5.0 5.0 - 8.0   Glucose, UA NEGATIVE NEGATIVE mg/dL   Hgb urine dipstick NEGATIVE NEGATIVE   Bilirubin Urine NEGATIVE NEGATIVE   Ketones, ur NEGATIVE NEGATIVE mg/dL   Protein, ur NEGATIVE NEGATIVE mg/dL   Nitrite NEGATIVE NEGATIVE   Leukocytes,Ua NEGATIVE NEGATIVE    Comment: Performed at [facility], [address] [place], [place] [address]  Glucose, capillary     Status: Abnormal   Collection Time: 07/09/22 11:23 AM  Result Value Ref Range   Glucose-Capillary 100 (H) 70 - 99 mg/dL    Comment: Glucose reference range applies only to samples taken after fasting for at least 8 hours.   Comment 1 Notify RN   Glucose, capillary     Status: Abnormal   Collection Time: 07/09/22  4:18 PM  Result Value Ref Range  Glucose-Capillary 147 (H) 70 - 99 mg/dL    Comment: Glucose reference range applies only to samples taken after fasting for at least 8 hours.  Glucose, capillary     Status: Abnormal   Collection Time: 07/09/22 11:10 PM  Result Value Ref Range   Glucose-Capillary 101 (H) 70 - 99 mg/dL    Comment: Glucose reference range applies only to samples taken after fasting for at least 8 hours.  Glucose, capillary     Status: None   Collection Time: 07/10/22  7:26 AM  Result Value Ref Range   Glucose-Capillary 91 70 - 99 mg/dL    Comment: Glucose reference range applies only to samples taken after fasting for at least 8 hours.  Glucose, capillary     Status: Abnormal   Collection Time: 07/10/22 11:32 AM  Result Value Ref  Range   Glucose-Capillary 101 (H) 70 - 99 mg/dL    Comment: Glucose reference range applies only to samples taken after fasting for at least 8 hours.   Comment 1 Notify RN     Blood Alcohol level:  Lab Results  Component Value Date   ETH <10 07/02/2022   ETH <10 29/52/8413    Metabolic Disorder Labs: Lab Results  Component Value Date   HGBA1C 5.6 03/19/2022   MPG 114.02 03/19/2022   No results found for: "PROLACTIN" Lab Results  Component Value Date   CHOL 131 03/19/2022   TRIG 92 03/19/2022   HDL 33 (L) 03/19/2022   CHOLHDL 4.0 03/19/2022   VLDL 18 03/19/2022   LDLCALC 80 03/19/2022    Physical Findings: AIMS:  , ,  ,  ,    CIWA:    COWS:     Musculoskeletal: Strength & Muscle Tone: within normal limits Gait & Station: unable to stand Patient leans: N/A  Psychiatric Specialty Exam:  Presentation  General Appearance: Appropriate for Environment; Casual  Eye Contact:Fleeting  Speech:Garbled  Speech Volume:Decreased  Handedness:Right   Mood and Affect  Mood:Anxious; Depressed  Affect:Flat   Thought Process  Thought Processes:Linear; Coherent; Goal Directed  Descriptions of Associations:Loose  Orientation:Partial  Thought Content:Scattered  History of Schizophrenia/Schizoaffective disorder:No  Duration of Psychotic Symptoms:No data recorded Hallucinations:No data recorded Ideas of Reference:None  Suicidal Thoughts:No data recorded Homicidal Thoughts:No data recorded  Sensorium  Memory:Recent Poor; Remote Poor  Judgment:Poor  Insight:Fair   Executive Functions  Concentration:Fair  Attention Span:Poor  Recall:Poor  Fund of Knowledge:Poor  Language:Poor   Psychomotor Activity  Psychomotor Activity:No data recorded  Assets  Assets:Financial Resources/Insurance   Sleep  Sleep:No data recorded   Physical Exam: Physical Exam Vitals and nursing note reviewed.  Constitutional:      Appearance: Normal appearance. He  is normal weight.  Neurological:     General: No focal deficit present.     Mental Status: He is alert and oriented to person, place, and time.  Psychiatric:        Attention and Perception: Attention and perception normal.        Mood and Affect: Mood and affect normal.        Speech: Speech is delayed.        Behavior: Behavior normal. Behavior is cooperative.        Thought Content: Thought content normal.        Cognition and Memory: Cognition is impaired. Memory is impaired.        Judgment: Judgment normal.    Review of Systems  Constitutional: Negative.   HENT: Negative.  Eyes: Negative.   Respiratory: Negative.    Cardiovascular: Negative.   Gastrointestinal: Negative.   Genitourinary: Negative.   Musculoskeletal: Negative.   Skin: Negative.   Neurological: Negative.   Endo/Heme/Allergies: Negative.   Psychiatric/Behavioral:  Positive for depression. The patient is nervous/anxious.    Blood pressure (!) 127/92, pulse 61, temperature 97.8 F (36.6 C), temperature source Oral, resp. rate 18, height '6\' 2"'$  (1.88 m), weight 70.8 kg, SpO2 98 %. Body mass index is 20.03 kg/m.   Treatment Plan Summary: Daily contact with patient to assess and evaluate symptoms and progress in treatment, Medication management, and Plan continue current medications.  Parks Ranger, DO 07/10/2022, 12:18 PM

## 2022-07-10 NOTE — [facility] Suicide Risk Assessment (Addendum)
[place] INPATIENT:  Family/Significant Other Suicide Prevention Education  Suicide Prevention Education:  Education Completed; Landry Dyke, wife, [phone] has been identified by the patient as the family member/significant other with whom the patient will be residing, and identified as the person(s) who will aid the patient in the event of a mental health crisis (suicidal ideations/suicide attempt).  With written consent from the patient, the family member/significant other has been provided the following suicide prevention education, prior to the and/or following the discharge of the patient.  The suicide prevention education provided includes the following: Suicide risk factors Suicide prevention and interventions National Suicide Hotline telephone number [facility] assessment telephone number [facility] Emergency Assistance [place] and/or Residential Mobile Crisis Unit telephone number  Request made of family/significant other to: Remove weapons (e.g., guns, rifles, knives), all items previously/currently identified as safety concern.   Remove drugs/medications (over-the-counter, prescriptions, illicit drugs), all items previously/currently identified as a safety concern.  The family member/significant other verbalizes understanding of the suicide prevention education information provided.  The family member/significant other agrees to remove the items of safety concern listed above.  Rozann Lesches 07/10/2022, 2:34 PM

## 2022-07-11 ENCOUNTER — Ambulatory Visit: Payer: Medicare Other | Admitting: Psychiatry

## 2022-07-11 DIAGNOSIS — F332 Major depressive disorder, recurrent severe without psychotic features: Secondary | ICD-10-CM | POA: Diagnosis not present

## 2022-07-11 LAB — GLUCOSE, CAPILLARY
Glucose-Capillary: 113 mg/dL — ABNORMAL HIGH (ref 70–99)
Glucose-Capillary: 146 mg/dL — ABNORMAL HIGH (ref 70–99)
Glucose-Capillary: 154 mg/dL — ABNORMAL HIGH (ref 70–99)

## 2022-07-11 MED ORDER — OLANZAPINE 5 MG PO TABS
10.0000 mg | ORAL_TABLET | Freq: Every day | ORAL | Status: DC
  Administered 2022-07-11 – 2022-07-30 (×20): 10 mg via ORAL
  Filled 2022-07-11 (×20): qty 2

## 2022-07-11 NOTE — Group Note (Signed)
[facility] LCSW Group Therapy Note   Group Date: 07/11/2022 Start Time: 1300 End Time: 1400   Type of Therapy/Topic:  Group Therapy:  Balance in Life  Participation Level:  Did Not Attend   Description of Group:    This group will address the concept of balance and how it feels and looks when one is unbalanced. Patients will be encouraged to process areas in their lives that are out of balance, and identify reasons for remaining unbalanced. Facilitators will guide patients utilizing problem- solving interventions to address and correct the stressor making their life unbalanced. Understanding and applying boundaries will be explored and addressed for obtaining  and maintaining a balanced life. Patients will be encouraged to explore ways to assertively make their unbalanced needs known to significant others in their lives, using other group members and facilitator for support and feedback.  Therapeutic Goals: Patient will identify two or more emotions or situations they have that consume much of in their lives. Patient will identify signs/triggers that life has become out of balance:  Patient will identify two ways to set boundaries in order to achieve balance in their lives:  Patient will demonstrate ability to communicate their needs through discussion and/or role plays  Summary of Patient Progress:    Group not held due to complex discharge planning.    Therapeutic Modalities:   Cognitive Behavioral Therapy Solution-Focused Therapy Assertiveness Training   Rozann Lesches, LCSW

## 2022-07-11 NOTE — [facility] Group Notes (Signed)
Pt attended recreation outside with music and karaoke. Pt attended.

## 2022-07-11 NOTE — [facility] Group Notes (Signed)
Patients were educated on the impact of positive reframing, and given examples of how positive reframing can help with coping and behavioral impacts. Pt's were then read poem by Donzetta Kohut ''The [facility] [place]'' in which the brain is used as a comparison with the fight or flight reaction to stress/fear.  Pt attended but did not participate when called upon.

## 2022-07-11 NOTE — Progress Notes (Signed)
1:1 NOTE  Patient continued on 1:1 for safety. Patient remains safe ambulating with front wheel walker in room. All fall protocol in place. Support and encouragement provided. Patient remains safe.

## 2022-07-11 NOTE — Progress Notes (Signed)
Physical Therapy Treatment Patient Details Name: John [name] MRN: [id number] DOB: [birth year] Today's Date: 07/11/2022   History of Present Illness Pt is a 81 y/o M who was voluntarily admitted to geriatric psychiatry for worsening depression. Pt was d/c from the unit ~2 months ago & was noncompliatn with medications. PMH: CA, depression, DM2, epidural hematoma s/p craniotomy (age 31), Horner syndrome, HTN, migraine, obesity, sleep apnea    PT Comments    Pt was sitting in recliner in day/community room. He is alert and oriented x 3. Did think it was Friday (actually Thursday) he presents with overall flat affect but was conversational when [place] encouraged/lead him to participate in conversation. Discussed how he met his wife and talked about his two children/daughters. Pt ambulated in hallways without AD however Pryor Curia does continue to recommend pt use RW at all times when not with PT/OT. He has extensive hx of falls. [name] does endorse fatigue but overall tolerated session well. He was sitting in recliner back in dayroom post session. HHPT recommend to maximize safety with all ADLs.    Recommendations for follow up therapy are one component of a multi-disciplinary discharge planning process, led by the attending physician.  Recommendations may be updated based on patient status, additional functional criteria and insurance authorization.  Follow Up Recommendations  Home health PT     Assistance Recommended at Discharge Frequent or constant Supervision/Assistance  Patient can return home with the following A little help with walking and/or transfers;A little help with bathing/dressing/bathroom;Assistance with cooking/housework;Direct supervision/assist for financial management;Assist for transportation;Help with stairs or ramp for entrance;Direct supervision/assist for medications management   Equipment Recommendations  None recommended by PT       Precautions / Restrictions  Precautions Precautions: Fall Restrictions Weight Bearing Restrictions: No     Mobility  Bed Mobility    General bed mobility comments: In recliner pre/post session in day room.    Transfers Overall transfer level: Needs assistance Equipment used: None, 1 person hand held assist Transfers: Sit to/from Stand Sit to Stand: Min guard  General transfer comment: CGA for safety to stand from recliner    Ambulation/Gait Ambulation/Gait assistance: Min guard Gait Distance (Feet): 300 Feet Assistive device: None Gait Pattern/deviations: Step-through pattern Gait velocity: decreased     General Gait Details: pt was able to ambulate > 300 ft withotu AD. gait belt for safety however CGA only. one occasion of scissoring but pt able to correct without intervention    Balance Overall balance assessment: Needs assistance Sitting-balance support: No upper extremity supported, Feet supported Sitting balance-Leahy Scale: Good     Standing balance support: No upper extremity supported, During functional activity Standing balance-Leahy Scale: Fair Standing balance comment: hx of falls       Cognition Arousal/Alertness: Awake/alert Behavior During Therapy: WFL for tasks assessed/performed, Flat affect (does not volunteer information but will answer questions appropriately throughout. Talked about his wife and two daughters.) Overall Cognitive Status: Within Functional Limits for tasks assessed                 General Comments General comments (skin integrity, edema, etc.): Overall pt tolerated session well. Was somewhat converstaional but needs encouragement to conversate. Attempted to reach out to pt's spouse as requested by SW.      Pertinent Vitals/Pain Pain Assessment Pain Assessment: No/denies pain     PT Goals (current goals can now be found in the care plan section) Acute Rehab PT Goals Patient Stated Goal: none stated Progress towards PT  goals: Progressing toward  goals    Frequency    Min 2X/week      PT Plan Current plan remains appropriate       AM-PAC PT "6 Clicks" Mobility   Outcome Measure  Help needed turning from your back to your side while in a flat bed without using bedrails?: None Help needed moving from lying on your back to sitting on the side of a flat bed without using bedrails?: A Little Help needed moving to and from a bed to a chair (including a wheelchair)?: A Little Help needed standing up from a chair using your arms (e.g., wheelchair or bedside chair)?: A Little Help needed to walk in hospital room?: A Little Help needed climbing 3-5 steps with a railing? : A Little 6 Click Score: 19    End of Session Equipment Utilized During Treatment: Gait belt Activity Tolerance: Patient tolerated treatment well Patient left: in chair;Other (comment) (in day room with RN staff present) Nurse Communication: Mobility status PT Visit Diagnosis: Unsteadiness on feet (R26.81);Muscle weakness (generalized) (M62.81)     Time: 1010-1033 PT Time Calculation (min) (ACUTE ONLY): 23 min  Charges:  $Gait Training: 8-22 mins $Therapeutic Activity: 8-22 mins                     Julaine Fusi PTA 07/11/22, 2:04 PM

## 2022-07-11 NOTE — Progress Notes (Signed)
Patient presents very depressed, flat affect. [place] continues to have very soft, slow speech, he does have thought blocking and noteable delay in speech when asked any questions. He does not forward any conversation and no interaction with peers on the unit. Patient continues to struggle with any volition to perform adls' or shift body weight. He reports he slept well and denies any pain. Patient denies any SI or HI at this time. NO signs pt is responding to internal stimuli, however patient continues to require prompting and assistance to move, and perform adls.  This am patient ambulated with gait belt and walker with +1 assist. Pt has also remained on aggressive toileting routine to promote skin health and prevent further skin breakdown.  Pt is safe.  Patient sacral foam dressing changed as it was non adherent this am. Barrier cream applied and new sacral dressing placed after patient skin cleansed.  Patients wife phoned as well and this Probation officer discussed plan of care with wife for over 20 minutes. Multiple questions answered and educated on ECT as she expressed concern about this stating '' I'm not thrilled about it. ''  Pt has been ambulated x 2 in the hallway thus far this shift with Probation officer and tolerated well.

## 2022-07-11 NOTE — Progress Notes (Signed)
[facility] MD Progress Note  07/11/2022 11:13 AM John [name]  MRN:  [id number] Subjective: John [name] continues to be flat and withdrawn.  He does state that he is anxious and depressed.  He states talks very softly and is hard to hear.  He has been compliant with his medications.  He is going to start ECT tomorrow and hopefully he will start coming out of his shell.  PT was contacted and they states that he can walk but he has no volition to get out of his chair.  He will need assistance for just about everything.  He is definitely decompensated since his last admission.  I spoke with his family yesterday and informed them if he continues down this road he is going to need skilled nursing facility because his wife is not to be able to take care of him.  Principal Problem: Major depressive disorder, recurrent severe without psychotic features ([place]) Diagnosis: Principal Problem:   Major depressive disorder, recurrent severe without psychotic features ([place]) Active Problems:   Pressure injury of skin  Total Time spent with patient: 15 minutes  Past Psychiatric History: Patient has a history of past depression repeated episodes throughout his life.  1 prior suicide attempt.  Was admitted to the hospital in mid May here with depression.  Was not showing significant improvement over time and was referred for ECT.  Patient received a few ECT treatments which generally appeared to be tolerated well. He did develop an arrhythmia which resolved.  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 06/2015 CT Head: abnl thickening of mid-dist R cervical ICA w/o narrowing - ? vasculitis and thrombosed/healing dissection; b. 01/2021 Carotid U/S: 1-39% bilat ICA stenoses.   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 Age 81 - struck in head w/ baseball  --> s/p craniotomy.   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 Approx 2015 - eval in [place] - conservatively managed.    Past Surgical History:  Procedure Laterality Date   ANKLE ARTHROSCOPY Left    fracture   CHOLECYSTECTOMY  2007   [place]   left frontal craniotomy for epidural hematoma   HIP ARTHROPLASTY Right 04/07/2022   Procedure: ARTHROPLASTY BIPOLAR HIP (HEMIARTHROPLASTY);  Surgeon: Earnestine Leys, MD;  Location: [facility] ORS;  Service: Orthopedics;  Laterality: Right;   INSERTION OF MESH  01/02/2022   Procedure: INSERTION OF MESH;  Surgeon: Herbert Pun, MD;  Location: [facility] ORS;  Service: General;;   LIGAMENT REPAIR Right    thumb   RETINAL DETACHMENT SURGERY     TREATMENT FISTULA ANAL  2006   Family History:  Family History  Problem Relation Age of Onset   Osteoporosis Mother    Depression Mother    Prostate cancer Father    Hypertension Father  Diabetes Brother    Kidney disease Neg Hx     Social History:  Social History   Substance and Sexual Activity  Alcohol Use Not Currently     Social History   Substance and Sexual Activity  Drug Use No    Social History   Socioeconomic History   Marital status: Married    Spouse name: John [name]   Number of children: 2   Years of education: Not on file   Highest education level: Master's degree (e.g., MA, MS, MEng, MEd, MSW, MBA)  Occupational History   Not on file  Tobacco Use   Smoking status: Never   Smokeless tobacco: Never  Vaping Use   Vaping Use: Never used  Substance and Sexual Activity   Alcohol use: Not Currently   Drug use: No   Sexual activity: Yes    Birth  control/protection: None  Other Topics Concern   Not on file  Social History Narrative   Live at home with wife.    Social Determinants of Health   Financial Resource Strain: Not on file  Food Insecurity: Food Insecurity Present (07/09/2022)   Hunger Vital Sign    Worried About Running Out of Food in the Last Year: Sometimes true    Ran Out of Food in the Last Year: Sometimes true  Transportation Needs: No Transportation Needs (07/09/2022)   PRAPARE - Hydrologist (Medical): No    Lack of Transportation (Non-Medical): No  Physical Activity: Not on file  Stress: Not on file  Social Connections: Not on file   Additional Social History:                         Sleep: Fair  Appetite:  Fair  Current Medications: Current Facility-Administered Medications  Medication Dose Route Frequency Provider Last Rate Last Admin   acetaminophen (TYLENOL) tablet 650 mg  650 mg Oral Q6H PRN Patrecia Pour, NP   650 mg at 07/10/22 1947   alum & mag hydroxide-simeth (MAALOX/MYLANTA) 200-200-20 MG/5ML suspension 30 mL  30 mL Oral Q4H PRN Patrecia Pour, NP       ARIPiprazole (ABILIFY) tablet 10 mg  10 mg Oral QPC breakfast Parks Ranger, DO   10 mg at 07/11/22 2458   ascorbic acid (VITAMIN C) tablet 500 mg  500 mg Oral BID Parks Ranger, DO   500 mg at 07/11/22 0802   aspirin EC tablet 81 mg  81 mg Oral Daily Patrecia Pour, NP   81 mg at 07/11/22 0803   buPROPion (WELLBUTRIN XL) 24 hr tablet 300 mg  300 mg Oral Daily Patrecia Pour, NP   300 mg at 07/11/22 0803   docusate sodium (COLACE) capsule 100 mg  100 mg Oral BID Patrecia Pour, NP   100 mg at 07/11/22 0803   insulin aspart (novoLOG) injection 0-6 Units  0-6 Units Subcutaneous TID WC Parks Ranger, DO   1 Units at 07/10/22 1611   liver oil-zinc oxide (DESITIN) 40 % ointment   Topical BID Parks Ranger, DO   Given at 07/11/22 0941   magnesium hydroxide (MILK OF  MAGNESIA) suspension 30 mL  30 mL Oral Daily PRN Patrecia Pour, NP       methocarbamol (ROBAXIN) tablet 500 mg  500 mg Oral Q6H PRN Patrecia Pour, NP       metoprolol tartrate (LOPRESSOR) tablet 12.5 mg  12.5 mg Oral  BID Parks Ranger, DO   12.5 mg at 07/11/22 1308   multivitamin with minerals tablet 1 tablet  1 tablet Oral Daily Parks Ranger, DO   1 tablet at 07/11/22 0803   polyethylene glycol (MIRALAX / GLYCOLAX) packet 17 g  17 g Oral Daily PRN Patrecia Pour, NP       protein supplement (ENSURE MAX) liquid  11 oz Oral BID Parks Ranger, DO   11 oz at 07/10/22 2129   senna (SENOKOT) tablet 8.6 mg  1 tablet Oral BID Patrecia Pour, NP   8.6 mg at 07/11/22 0803   sertraline (ZOLOFT) tablet 50 mg  50 mg Oral QHS Patrecia Pour, NP   50 mg at 07/10/22 2128   tamsulosin (FLOMAX) capsule 0.4 mg  0.4 mg Oral QPC supper Patrecia Pour, NP   0.4 mg at 07/10/22 1611   traZODone (DESYREL) tablet 50 mg  50 mg Oral QHS PRN Patrecia Pour, NP   50 mg at 07/10/22 2128    Lab Results:  Results for orders placed or performed during the hospital encounter of 07/04/22 (from the past 48 hour(s))  Glucose, capillary     Status: Abnormal   Collection Time: 07/09/22 11:23 AM  Result Value Ref Range   Glucose-Capillary 100 (H) 70 - 99 mg/dL    Comment: Glucose reference range applies only to samples taken after fasting for at least 8 hours.   Comment 1 Notify RN   Glucose, capillary     Status: Abnormal   Collection Time: 07/09/22  4:18 PM  Result Value Ref Range   Glucose-Capillary 147 (H) 70 - 99 mg/dL    Comment: Glucose reference range applies only to samples taken after fasting for at least 8 hours.  Glucose, capillary     Status: Abnormal   Collection Time: 07/09/22 11:10 PM  Result Value Ref Range   Glucose-Capillary 101 (H) 70 - 99 mg/dL    Comment: Glucose reference range applies only to samples taken after fasting for at least 8 hours.  Glucose, capillary      Status: None   Collection Time: 07/10/22  7:26 AM  Result Value Ref Range   Glucose-Capillary 91 70 - 99 mg/dL    Comment: Glucose reference range applies only to samples taken after fasting for at least 8 hours.  Glucose, capillary     Status: Abnormal   Collection Time: 07/10/22 11:32 AM  Result Value Ref Range   Glucose-Capillary 101 (H) 70 - 99 mg/dL    Comment: Glucose reference range applies only to samples taken after fasting for at least 8 hours.   Comment 1 Notify RN   Glucose, capillary     Status: Abnormal   Collection Time: 07/10/22  3:56 PM  Result Value Ref Range   Glucose-Capillary 159 (H) 70 - 99 mg/dL    Comment: Glucose reference range applies only to samples taken after fasting for at least 8 hours.  Glucose, capillary     Status: None   Collection Time: 07/10/22  8:35 PM  Result Value Ref Range   Glucose-Capillary 88 70 - 99 mg/dL    Comment: Glucose reference range applies only to samples taken after fasting for at least 8 hours.  Glucose, capillary     Status: Abnormal   Collection Time: 07/11/22  7:33 AM  Result Value Ref Range   Glucose-Capillary 113 (H) 70 - 99 mg/dL    Comment: Glucose reference range applies only  to samples taken after fasting for at least 8 hours.    Blood Alcohol level:  Lab Results  Component Value Date   ETH <10 07/02/2022   ETH <10 27/25/3664    Metabolic Disorder Labs: Lab Results  Component Value Date   HGBA1C 5.6 03/19/2022   MPG 114.02 03/19/2022   No results found for: "PROLACTIN" Lab Results  Component Value Date   CHOL 131 03/19/2022   TRIG 92 03/19/2022   HDL 33 (L) 03/19/2022   CHOLHDL 4.0 03/19/2022   VLDL 18 03/19/2022   LDLCALC 80 03/19/2022    Physical Findings: AIMS:  , ,  ,  ,    CIWA:    COWS:     Musculoskeletal: Strength & Muscle Tone: within normal limits Gait & Station: unsteady Patient leans: N/A  Psychiatric Specialty Exam:  Presentation  General Appearance: Appropriate for  Environment; Casual  Eye Contact:Fleeting  Speech:Garbled  Speech Volume:Decreased  Handedness:Right   Mood and Affect  Mood:Anxious; Depressed  Affect:Flat   Thought Process  Thought Processes:Linear; Coherent; Goal Directed  Descriptions of Associations:Loose  Orientation:Partial  Thought Content:Scattered  History of Schizophrenia/Schizoaffective disorder:No  Duration of Psychotic Symptoms:No data recorded Hallucinations:No data recorded Ideas of Reference:None  Suicidal Thoughts:No data recorded Homicidal Thoughts:No data recorded  Sensorium  Memory:Recent Poor; Remote Poor  Judgment:Poor  Insight:Fair   Executive Functions  Concentration:Fair  Attention Span:Poor  Recall:Poor  Fund of Knowledge:Poor  Language:Poor   Psychomotor Activity  Psychomotor Activity:No data recorded  Assets  Assets:Financial Resources/Insurance   Sleep  Sleep:No data recorded   Physical Exam: Physical Exam Vitals and nursing note reviewed.  Constitutional:      Appearance: Normal appearance. He is normal weight.  Neurological:     General: No focal deficit present.     Mental Status: He is alert and oriented to person, place, and time.  Psychiatric:        Attention and Perception: Attention and perception normal.        Mood and Affect: Mood is depressed. Affect is flat.        Speech: Speech is delayed.        Behavior: Behavior normal. Behavior is cooperative.        Thought Content: Thought content normal.        Cognition and Memory: Cognition is impaired. Memory is impaired.        Judgment: Judgment is inappropriate.    Review of Systems  Constitutional: Negative.   HENT: Negative.    Eyes: Negative.   Respiratory: Negative.    Cardiovascular: Negative.   Gastrointestinal: Negative.   Genitourinary: Negative.   Musculoskeletal: Negative.   Skin: Negative.   Neurological: Negative.   Endo/Heme/Allergies: Negative.    Psychiatric/Behavioral:  Positive for depression.    Blood pressure 120/84, pulse (!) 56, temperature 97.8 F (36.6 C), temperature source Oral, resp. rate 18, height '6\' 2"'$  (1.88 m), weight 70.8 kg, SpO2 98 %. Body mass index is 20.03 kg/m.   Treatment Plan Summary: Daily contact with patient to assess and evaluate symptoms and progress in treatment, Medication management, and Plan discontinue Abilify and start Zyprexa at bedtime for its mood enhancing properties and appetite.  ECT in the morning.  [place], DO 07/11/2022, 11:13 AM

## 2022-07-11 NOTE — Progress Notes (Signed)
Occupational Therapy Treatment Patient Details Name: John [name] MRN: [id number] DOB: Oct 26, 1941 Today's Date: 07/11/2022   History of present illness Pt is a 81 y/o M who was voluntarily admitted to geriatric psychiatry for worsening depression. Pt was d/c from the unit ~2 months ago & was noncompliatn with medications. PMH: CA, depression, DM2, epidural hematoma s/p craniotomy (age 85), Horner syndrome, HTN, migraine, obesity, sleep apnea   OT comments  Chart reviewed to date, pt greeted in courtyard agreeable to OT tx session. Tx session targeted pillbox text in order to facilitate improved indep/safe med management. Pt participates in pillbox assessment not standardized as, requires upwards of 12 minutes to participate in test and is unable to read labels. Pt reports he does not know where his glasses are. Therapist provided verbal instructions for each pill bottle and pt performs with 100% accuracy, even demonstrating awareness of mistakes and correcting them with no additional cues provided by therapist. Overall increased time required for processing and would recommend supervision for task completion in the future. Pt able to attend to task with increased time despite increased external stimuli with minimal verbal cues. Pt endorses motivation for participation in task is limited without encouragement. Pt is left in care of staff in courtyard, all needs. OT will continue to follow acutely.    Recommendations for follow up therapy are one component of a multi-disciplinary discharge planning process, led by the attending physician.  Recommendations may be updated based on patient status, additional functional criteria and insurance authorization.    Follow Up Recommendations  Home health OT    Assistance Recommended at Discharge Frequent or constant Supervision/Assistance  Patient can return home with the following  Direct supervision/assist for medications management;Direct supervision/assist  for financial management;Help with stairs or ramp for entrance   Equipment Recommendations  None recommended by OT    Recommendations for Other Services      Precautions / Restrictions Precautions Precautions: Fall Restrictions Weight Bearing Restrictions: No       Mobility Bed Mobility Overal bed mobility: Needs Assistance             General bed mobility comments: in courtyard pre post session    Transfers     Transfers: Sit to/from Stand Sit to Stand: Min guard                 Balance Overall balance assessment: Needs assistance Sitting-balance support: No upper extremity supported, Feet supported       Standing balance support: No upper extremity supported, During functional activity Standing balance-Leahy Scale: Fair                             ADL either performed or assessed with clinical judgement   ADL                                         General ADL Comments: amb approx 100' with RW with supervision, no LOB, good safety awareness    Extremity/Trunk Assessment              Vision       Perception     Praxis      Cognition Arousal/Alertness: Awake/alert Behavior During Therapy: WFL for tasks assessed/performed, Flat affect Overall Cognitive Status: Within Functional Limits for tasks assessed  General Comments: pt participates in pillbox assessment not standardized as, requires upwards of 12 minutes to participate in test and is unable to read labels. Pt reports he does not know where his glasses are. Therapist provided verbal instructions for each pill bottle and pt performs with 100% accuracy, even demonstrating awareness of mistakes and correcting them with no additional cues provided by therapist. Overall increased time required for processing and would recommend supervision for task completion in the future.        Exercises      Shoulder  Instructions       General Comments Overall pt tolerated session well. Was somewhat converstaional but needs encouragement to conversate. Attempted to reach out to pt's spouse as requested by SW.    Pertinent Vitals/ Pain       Pain Assessment Pain Assessment: No/denies pain  Home Living                                          Prior Functioning/Environment              Frequency  Min 2X/week        Progress Toward Goals  OT Goals(current goals can now be found in the care plan section)  Progress towards OT goals: Progressing toward goals     Plan Discharge plan remains appropriate;Frequency remains appropriate    Co-evaluation                 AM-PAC OT "6 Clicks" Daily Activity     Outcome Measure   Help from another person eating meals?: None Help from another person taking care of personal grooming?: A Little Help from another person toileting, which includes using toliet, bedpan, or urinal?: A Little Help from another person bathing (including washing, rinsing, drying)?: A Little Help from another person to put on and taking off regular upper body clothing?: A Little Help from another person to put on and taking off regular lower body clothing?: A Little 6 Click Score: 19    End of Session Equipment Utilized During Treatment: Rolling walker (2 wheels)  OT Visit Diagnosis: Other abnormalities of gait and mobility (R26.89);Muscle weakness (generalized) (M62.81)   Activity Tolerance Patient tolerated treatment well   Patient Left in chair (in courtyard with staff)   Nurse Communication Mobility status        Time: 1610-9604 OT Time Calculation (min): 26 min  Charges: OT General Charges $OT Visit: 1 Visit OT Treatments $Self Care/Home Management : 23-37 mins  Shanon Payor, OTD OTR/L  07/11/22, 4:19 PM

## 2022-07-12 ENCOUNTER — Other Ambulatory Visit: Payer: Self-pay | Admitting: Psychiatry

## 2022-07-12 DIAGNOSIS — F332 Major depressive disorder, recurrent severe without psychotic features: Secondary | ICD-10-CM | POA: Diagnosis not present

## 2022-07-12 LAB — GLUCOSE, CAPILLARY
Glucose-Capillary: 111 mg/dL — ABNORMAL HIGH (ref 70–99)
Glucose-Capillary: 131 mg/dL — ABNORMAL HIGH (ref 70–99)
Glucose-Capillary: 149 mg/dL — ABNORMAL HIGH (ref 70–99)
Glucose-Capillary: 89 mg/dL (ref 70–99)

## 2022-07-12 MED ORDER — MODAFINIL 100 MG PO TABS
100.0000 mg | ORAL_TABLET | Freq: Every day | ORAL | Status: DC
  Administered 2022-07-12 – 2022-07-31 (×20): 100 mg via ORAL
  Filled 2022-07-12 (×20): qty 1

## 2022-07-12 NOTE — Progress Notes (Signed)
Patient is A+O x 2 sometimes 3. He denies SI/HI/AVH. Denies pain. Patient is reminded to alert staff when he has to use the bathroom even though toileting schedule is in place. Foam dressing replaced on buttock as per MD order. No open area noted.  Patient's wife phoned in asking if we found Chevez's eyeglasses. No record of glasses found on patient's property sheet, locker, or bedroom. Admitting nurse noted that patient stated he wears eyeglasses and hearing aids but does not have either item with him. Wife then wanted to know if the glasses may have been left in the emergency room. ED staff brought a pair of glasses to the floor but patient stated that those were not his. Meanwhile, patient's wife dropped off an alternate pair of eyeglasses to the hospital. This pair pair was placed on property sheet and signed by 2 nurses and the patient. Patient is now wearing them.   Patient received a visit from his wife. Visit appears to be going well.

## 2022-07-12 NOTE — Progress Notes (Signed)
Patient ID: John [name], male   DOB: 08/06/1941, 81 y.o.   MRN: [id number] Brief consult: 81 year old man with recurrent severe depression returned to the hospital because of worsening depressive symptoms.  He did well with ECT previously.  Reviewed case and spoke with primary psychiatrist and just now spoke with patient.  We recommend resuming ECT.  Patient is on schedule for Monday and an order will be placed for him to be n.p.o. Sunday night.  Patient aware and agreeable.

## 2022-07-12 NOTE — Progress Notes (Signed)
[facility] MD Progress Note  07/12/2022 11:28 AM John [name]  MRN:  [id number] Subjective: John [name] is seen on rounds.  He is becoming more verbal.  He is still very withdrawn and quiet.  He has been compliant with his medications.  Occupational therapy has seen him.  I spoke with Dr. Weber Cooks about ECT starting on Monday which the family has agreed to.  Principal Problem: Major depressive disorder, recurrent severe without psychotic features ([place]) Diagnosis: Principal Problem:   Major depressive disorder, recurrent severe without psychotic features ([place]) Active Problems:   Pressure injury of skin  Total Time spent with patient: 15 minutes  Past Psychiatric History:  Patient has a history of past depression repeated episodes throughout his life.  1 prior suicide attempt.  Was admitted to the hospital in mid May here with depression.  Was not showing significant improvement over time and was referred for ECT.  Patient received a few ECT treatments which generally appeared to be tolerated well. He did develop an arrhythmia which resolved.  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 06/2015 CT Head: abnl thickening of mid-dist R cervical ICA w/o narrowing - ? vasculitis and thrombosed/healing dissection; b. 01/2021 Carotid U/S: 1-39% bilat ICA stenoses.   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 Age 89 - struck in head w/ baseball --> s/p craniotomy.   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 Approx  2015 - eval in [place] - conservatively managed.    Past Surgical History:  Procedure Laterality Date   ANKLE ARTHROSCOPY Left    fracture   CHOLECYSTECTOMY  2007   [place]   left frontal craniotomy for epidural hematoma   HIP ARTHROPLASTY Right 04/07/2022   Procedure: ARTHROPLASTY BIPOLAR HIP (HEMIARTHROPLASTY);  Surgeon: Earnestine Leys, MD;  Location: [facility] ORS;  Service: Orthopedics;  Laterality: Right;   INSERTION OF MESH  01/02/2022   Procedure: INSERTION OF MESH;  Surgeon: Herbert Pun, MD;  Location: [facility] ORS;  Service: General;;   LIGAMENT REPAIR Right    thumb   RETINAL DETACHMENT SURGERY     TREATMENT FISTULA ANAL  2006   Family History:  Family History  Problem Relation Age of Onset   Osteoporosis Mother    Depression Mother    Prostate cancer Father    Hypertension Father    Diabetes Brother    Kidney disease Neg Hx    Family Psychiatric  History: Unremarkable Social History:  Social History   Substance and Sexual Activity  Alcohol Use Not Currently     Social History   Substance and Sexual Activity  Drug Use No    Social History   Socioeconomic History   Marital status: Married    Spouse name: [name]   Number of children: 2   Years of education:  Not on file   Highest education level: Master's degree (e.g., MA, MS, MEng, MEd, MSW, MBA)  Occupational History   Not on file  Tobacco Use   Smoking status: Never   Smokeless tobacco: Never  Vaping Use   Vaping Use: Never used  Substance and Sexual Activity   Alcohol use: Not Currently   Drug use: No   Sexual activity: Yes    Birth control/protection: None  Other Topics Concern   Not on file  Social History Narrative   Live at home with wife.    Social Determinants of Health   Financial Resource Strain: Not on file  Food Insecurity: Food Insecurity Present (07/09/2022)   Hunger Vital Sign    Worried About Running Out of Food in the Last Year: Sometimes true    Ran Out of Food in the  Last Year: Sometimes true  Transportation Needs: No Transportation Needs (07/09/2022)   PRAPARE - Hydrologist (Medical): No    Lack of Transportation (Non-Medical): No  Physical Activity: Not on file  Stress: Not on file  Social Connections: Not on file   Additional Social History:                         Sleep: Good  Appetite:  Fair  Current Medications: Current Facility-Administered Medications  Medication Dose Route Frequency Provider Last Rate Last Admin   acetaminophen (TYLENOL) tablet 650 mg  650 mg Oral Q6H PRN Patrecia Pour, NP   650 mg at 07/11/22 2151   alum & mag hydroxide-simeth (MAALOX/MYLANTA) 200-200-20 MG/5ML suspension 30 mL  30 mL Oral Q4H PRN Patrecia Pour, NP       ascorbic acid (VITAMIN C) tablet 500 mg  500 mg Oral BID Parks Ranger, DO   500 mg at 07/12/22 4235   aspirin EC tablet 81 mg  81 mg Oral Daily Patrecia Pour, NP   81 mg at 07/12/22 3614   buPROPion (WELLBUTRIN XL) 24 hr tablet 300 mg  300 mg Oral Daily Patrecia Pour, NP   300 mg at 07/12/22 4315   docusate sodium (COLACE) capsule 100 mg  100 mg Oral BID Patrecia Pour, NP   100 mg at 07/12/22 0924   insulin aspart (novoLOG) injection 0-6 Units  0-6 Units Subcutaneous TID WC Parks Ranger, DO   1 Units at 07/11/22 1638   liver oil-zinc oxide (DESITIN) 40 % ointment   Topical BID Parks Ranger, DO   Given at 07/12/22 4008   magnesium hydroxide (MILK OF MAGNESIA) suspension 30 mL  30 mL Oral Daily PRN Patrecia Pour, NP       methocarbamol (ROBAXIN) tablet 500 mg  500 mg Oral Q6H PRN Patrecia Pour, NP       metoprolol tartrate (LOPRESSOR) tablet 12.5 mg  12.5 mg Oral BID Parks Ranger, DO   12.5 mg at 07/12/22 6761   multivitamin with minerals tablet 1 tablet  1 tablet Oral Daily Parks Ranger, DO   1 tablet at 07/12/22 0924   OLANZapine (ZYPREXA) tablet 10 mg  10 mg Oral QHS Parks Ranger, DO   10  mg at 07/11/22 2150   polyethylene glycol (MIRALAX / GLYCOLAX) packet 17 g  17 g Oral Daily PRN Patrecia Pour, NP       protein supplement (ENSURE MAX) liquid  11 oz Oral BID Parks Ranger, DO  11 oz at 07/12/22 0928   senna (SENOKOT) tablet 8.6 mg  1 tablet Oral BID Patrecia Pour, NP   8.6 mg at 07/12/22 1660   sertraline (ZOLOFT) tablet 50 mg  50 mg Oral QHS Patrecia Pour, NP   50 mg at 07/11/22 2153   tamsulosin (FLOMAX) capsule 0.4 mg  0.4 mg Oral QPC supper Patrecia Pour, NP   0.4 mg at 07/11/22 1639   traZODone (DESYREL) tablet 50 mg  50 mg Oral QHS PRN Patrecia Pour, NP   50 mg at 07/10/22 2128    Lab Results:  Results for orders placed or performed during the hospital encounter of 07/04/22 (from the past 48 hour(s))  Glucose, capillary     Status: Abnormal   Collection Time: 07/10/22 11:32 AM  Result Value Ref Range   Glucose-Capillary 101 (H) 70 - 99 mg/dL    Comment: Glucose reference range applies only to samples taken after fasting for at least 8 hours.   Comment 1 Notify RN   Glucose, capillary     Status: Abnormal   Collection Time: 07/10/22  3:56 PM  Result Value Ref Range   Glucose-Capillary 159 (H) 70 - 99 mg/dL    Comment: Glucose reference range applies only to samples taken after fasting for at least 8 hours.  Glucose, capillary     Status: None   Collection Time: 07/10/22  8:35 PM  Result Value Ref Range   Glucose-Capillary 88 70 - 99 mg/dL    Comment: Glucose reference range applies only to samples taken after fasting for at least 8 hours.  Glucose, capillary     Status: Abnormal   Collection Time: 07/11/22  7:33 AM  Result Value Ref Range   Glucose-Capillary 113 (H) 70 - 99 mg/dL    Comment: Glucose reference range applies only to samples taken after fasting for at least 8 hours.  Glucose, capillary     Status: Abnormal   Collection Time: 07/11/22 11:32 AM  Result Value Ref Range   Glucose-Capillary 146 (H) 70 - 99 mg/dL    Comment:  Glucose reference range applies only to samples taken after fasting for at least 8 hours.  Glucose, capillary     Status: Abnormal   Collection Time: 07/11/22  4:14 PM  Result Value Ref Range   Glucose-Capillary 154 (H) 70 - 99 mg/dL    Comment: Glucose reference range applies only to samples taken after fasting for at least 8 hours.  Glucose, capillary     Status: Abnormal   Collection Time: 07/12/22  7:59 AM  Result Value Ref Range   Glucose-Capillary 111 (H) 70 - 99 mg/dL    Comment: Glucose reference range applies only to samples taken after fasting for at least 8 hours.    Blood Alcohol level:  Lab Results  Component Value Date   ETH <10 07/02/2022   ETH <10 63/10/6008    Metabolic Disorder Labs: Lab Results  Component Value Date   HGBA1C 5.6 03/19/2022   MPG 114.02 03/19/2022   No results found for: "PROLACTIN" Lab Results  Component Value Date   CHOL 131 03/19/2022   TRIG 92 03/19/2022   HDL 33 (L) 03/19/2022   CHOLHDL 4.0 03/19/2022   VLDL 18 03/19/2022   LDLCALC 80 03/19/2022    Physical Findings: AIMS:  , ,  ,  ,    CIWA:    COWS:     Musculoskeletal: Strength & Muscle Tone: within normal limits Gait &  Station: unable to stand Patient leans: N/A  Psychiatric Specialty Exam:  Presentation  General Appearance: Appropriate for Environment; Casual  Eye Contact:Fleeting  Speech:Garbled  Speech Volume:Decreased  Handedness:Right   Mood and Affect  Mood:Anxious; Depressed  Affect:Flat   Thought Process  Thought Processes:Linear; Coherent; Goal Directed  Descriptions of Associations:Loose  Orientation:Partial  Thought Content:Scattered  History of Schizophrenia/Schizoaffective disorder:No  Duration of Psychotic Symptoms:No data recorded Hallucinations:No data recorded Ideas of Reference:None  Suicidal Thoughts:No data recorded Homicidal Thoughts:No data recorded  Sensorium  Memory:Recent Poor; Remote  Poor  Judgment:Poor  Insight:Fair   Executive Functions  Concentration:Fair  Attention Span:Poor  Recall:Poor  Fund of Knowledge:Poor  Language:Poor   Psychomotor Activity  Psychomotor Activity:No data recorded  Assets  Assets:Financial Resources/Insurance   Sleep  Sleep:No data recorded   Physical Exam: Physical Exam Vitals and nursing note reviewed.  Constitutional:      Appearance: Normal appearance. He is normal weight.  Neurological:     General: No focal deficit present.     Mental Status: He is alert and oriented to person, place, and time.  Psychiatric:        Attention and Perception: Attention and perception normal.        Mood and Affect: Mood is depressed. Affect is flat.        Speech: Speech is delayed.        Behavior: Behavior normal. Behavior is cooperative.        Thought Content: Thought content normal.        Cognition and Memory: Cognition is impaired. Memory is impaired.        Judgment: Judgment is inappropriate.    Review of Systems  Constitutional: Negative.   HENT: Negative.    Eyes: Negative.   Respiratory: Negative.    Cardiovascular: Negative.   Gastrointestinal: Negative.   Genitourinary: Negative.   Musculoskeletal: Negative.   Skin: Negative.   Neurological: Negative.   Endo/Heme/Allergies: Negative.   Psychiatric/Behavioral:  Positive for depression.    Blood pressure 120/87, pulse 64, temperature 97.8 F (36.6 C), resp. rate 18, height '6\' 2"'$  (1.88 m), weight 70.8 kg, SpO2 100 %. Body mass index is 20.03 kg/m.   Treatment Plan Summary: Daily contact with patient to assess and evaluate symptoms and progress in treatment, Medication management, and Plan start Provigil 100 mg in the morning.  [place], DO 07/12/2022, 11:28 AM

## 2022-07-12 NOTE — Plan of Care (Addendum)
Patient is anxious and depressed. Slow in speech but clear. AOX4 . Denies SI/HI. One to one checks were done on a patient throughout the shift. VS are stable.compliant to medications. Complain of mild pain and tylenol was administered and pain improved. Pt was sleeping most of the shift on hid bed with eyes closed. Ate meals and snacks. No other issues.

## 2022-07-13 DIAGNOSIS — F332 Major depressive disorder, recurrent severe without psychotic features: Secondary | ICD-10-CM | POA: Diagnosis not present

## 2022-07-13 LAB — GLUCOSE, CAPILLARY
Glucose-Capillary: 94 mg/dL (ref 70–99)
Glucose-Capillary: 93 mg/dL (ref 70–99)
Glucose-Capillary: 193 mg/dL — ABNORMAL HIGH (ref 70–99)
Glucose-Capillary: 100 mg/dL — ABNORMAL HIGH (ref 70–99)

## 2022-07-13 NOTE — Progress Notes (Signed)
Patient is A+O x 3. He denies pain. He denies SI/HI/AVH. Bathroom toileting schedule in place. Foam dressing applied to buttock yesterday is clean, dry, and intact. Good appetite. No distress noted.

## 2022-07-13 NOTE — [facility] Group Notes (Signed)
Patient participated in morning group therapy and 1.5 hours of fresh air/music therapy.

## 2022-07-13 NOTE — Progress Notes (Signed)
Patient is alert and oriented times 2. Mood and affect appropriate. Patient rates pain as 0/10. He denies SI, HI, and AVH. Patient does endorse feelings of anxiety and depression at this time. Patient states he slept well last night. Evening medicines administered whole by mouth without difficulty. Patient ate snack in day room; appetite was fair. Patient remains on unit with Q15 minute checks in place.

## 2022-07-13 NOTE — Progress Notes (Signed)
[facility] MD Progress Note  07/13/2022 11:54 AM John [name]  MRN:  [id number] Subjective: John [name] is seen on rounds.  His affect appears to be a little bit more alert.  He is able to verbalize appropriately but very softly.  He is very hard to hear.  He has been compliant with his medications.  No side effects.  He is on the schedule for ECT on Monday.  Principal Problem: Major depressive disorder, recurrent severe without psychotic features ([place]) Diagnosis: Principal Problem:   Major depressive disorder, recurrent severe without psychotic features ([place]) Active Problems:   Pressure injury of skin  Total Time spent with patient: 15 minutes  Past Psychiatric History:  Patient has a history of past depression repeated episodes throughout his life.  1 prior suicide attempt.  Was admitted to the hospital in mid May here with depression.  Was not showing significant improvement over time and was referred for ECT.  Patient received a few ECT treatments which generally appeared to be tolerated well. He did develop an arrhythmia which resolved.  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 06/2015 CT Head: abnl thickening of mid-dist R cervical ICA w/o narrowing - ? vasculitis and thrombosed/healing dissection; b. 01/2021 Carotid U/S: 1-39% bilat ICA stenoses.   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 Age 72 - struck in head w/ baseball --> s/p craniotomy.   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 Approx  2015 - eval [place] - conservatively managed.    Past Surgical History:  Procedure Laterality Date   ANKLE ARTHROSCOPY Left    fracture   CHOLECYSTECTOMY  2007   [place]   left frontal craniotomy for epidural hematoma   HIP ARTHROPLASTY Right 04/07/2022   Procedure: ARTHROPLASTY BIPOLAR HIP (HEMIARTHROPLASTY);  Surgeon: Earnestine Leys, MD;  Location: [facility] ORS;  Service: Orthopedics;  Laterality: Right;   INSERTION OF MESH  01/02/2022   Procedure: INSERTION OF MESH;  Surgeon: Herbert Pun, MD;  Location: [facility] ORS;  Service: General;;   LIGAMENT REPAIR Right    thumb   RETINAL DETACHMENT SURGERY     TREATMENT FISTULA ANAL  2006   Family History:  Family History  Problem Relation Age of Onset   Osteoporosis Mother    Depression Mother    Prostate cancer Father    Hypertension Father    Diabetes Brother    Kidney disease Neg Hx     Social History:  Social History   Substance and Sexual Activity  Alcohol Use Not Currently     Social History   Substance and Sexual Activity  Drug Use No    Social History   Socioeconomic History   Marital status: Married    Spouse name: [name]   Number of children: 2   Years of  education: Not on file   Highest education level: Master's degree (e.g., MA, MS, MEng, MEd, MSW, MBA)  Occupational History   Not on file  Tobacco Use   Smoking status: Never   Smokeless tobacco: Never  Vaping Use   Vaping Use: Never used  Substance and Sexual Activity   Alcohol use: Not Currently   Drug use: No   Sexual activity: Yes    Birth control/protection: None  Other Topics Concern   Not on file  Social History Narrative   Live at home with wife.    Social Determinants of Health   Financial Resource Strain: Not on file  Food Insecurity: Food Insecurity Present (07/09/2022)   Hunger Vital Sign    Worried About Running Out of Food in the Last Year: Sometimes true    Ran Out of Food in the Last Year: Sometimes true  Transportation  Needs: No Transportation Needs (07/09/2022)   PRAPARE - Hydrologist (Medical): No    Lack of Transportation (Non-Medical): No  Physical Activity: Not on file  Stress: Not on file  Social Connections: Not on file   Additional Social History:                         Sleep: Good  Appetite:  Fair  Current Medications: Current Facility-Administered Medications  Medication Dose Route Frequency Provider Last Rate Last Admin   acetaminophen (TYLENOL) tablet 650 mg  650 mg Oral Q6H PRN Patrecia Pour, NP   650 mg at 07/11/22 2151   alum & mag hydroxide-simeth (MAALOX/MYLANTA) 200-200-20 MG/5ML suspension 30 mL  30 mL Oral Q4H PRN Patrecia Pour, NP       ascorbic acid (VITAMIN C) tablet 500 mg  500 mg Oral BID Parks Ranger, DO   500 mg at 07/13/22 4709   aspirin EC tablet 81 mg  81 mg Oral Daily Patrecia Pour, NP   81 mg at 07/13/22 0905   buPROPion (WELLBUTRIN XL) 24 hr tablet 300 mg  300 mg Oral Daily Patrecia Pour, NP   300 mg at 07/13/22 0906   docusate sodium (COLACE) capsule 100 mg  100 mg Oral BID Patrecia Pour, NP   100 mg at 07/13/22 0905   insulin aspart (novoLOG) injection 0-6 Units  0-6 Units Subcutaneous TID WC Parks Ranger, DO   1 Units at 07/11/22 1638   liver oil-zinc oxide (DESITIN) 40 % ointment   Topical BID Parks Ranger, DO   Given at 07/12/22 1954   magnesium hydroxide (MILK OF MAGNESIA) suspension 30 mL  30 mL Oral Daily PRN Patrecia Pour, NP       methocarbamol (ROBAXIN) tablet 500 mg  500 mg Oral Q6H PRN Patrecia Pour, NP       metoprolol tartrate (LOPRESSOR) tablet 12.5 mg  12.5 mg Oral BID Parks Ranger, DO   12.5 mg at 07/13/22 0906   modafinil (PROVIGIL) tablet 100 mg  100 mg Oral QPC breakfast Parks Ranger, DO   100 mg at 07/13/22 6283   multivitamin with minerals tablet 1 tablet  1 tablet Oral Daily Parks Ranger, DO   1 tablet at 07/13/22 0905    OLANZapine (ZYPREXA) tablet 10 mg  10 mg Oral QHS Parks Ranger, DO   10 mg at 07/12/22 2127   polyethylene glycol (MIRALAX / GLYCOLAX) packet 17 g  17 g Oral Daily PRN  Patrecia Pour, NP       protein supplement (ENSURE MAX) liquid  11 oz Oral BID Parks Ranger, DO   11 oz at 07/13/22 8466   senna (SENOKOT) tablet 8.6 mg  1 tablet Oral BID Patrecia Pour, NP   8.6 mg at 07/13/22 5993   sertraline (ZOLOFT) tablet 50 mg  50 mg Oral QHS Patrecia Pour, NP   50 mg at 07/12/22 2127   tamsulosin (FLOMAX) capsule 0.4 mg  0.4 mg Oral QPC supper Patrecia Pour, NP   0.4 mg at 07/12/22 1703   traZODone (DESYREL) tablet 50 mg  50 mg Oral QHS PRN Patrecia Pour, NP   50 mg at 07/12/22 2127    Lab Results:  Results for orders placed or performed during the hospital encounter of 07/04/22 (from the past 48 hour(s))  Glucose, capillary     Status: Abnormal   Collection Time: 07/11/22  4:14 PM  Result Value Ref Range   Glucose-Capillary 154 (H) 70 - 99 mg/dL    Comment: Glucose reference range applies only to samples taken after fasting for at least 8 hours.  Glucose, capillary     Status: Abnormal   Collection Time: 07/12/22  7:59 AM  Result Value Ref Range   Glucose-Capillary 111 (H) 70 - 99 mg/dL    Comment: Glucose reference range applies only to samples taken after fasting for at least 8 hours.  Glucose, capillary     Status: Abnormal   Collection Time: 07/12/22 11:37 AM  Result Value Ref Range   Glucose-Capillary 131 (H) 70 - 99 mg/dL    Comment: Glucose reference range applies only to samples taken after fasting for at least 8 hours.  Glucose, capillary     Status: Abnormal   Collection Time: 07/12/22  4:28 PM  Result Value Ref Range   Glucose-Capillary 149 (H) 70 - 99 mg/dL    Comment: Glucose reference range applies only to samples taken after fasting for at least 8 hours.  Glucose, capillary     Status: None   Collection Time: 07/12/22  7:28 PM  Result Value Ref  Range   Glucose-Capillary 89 70 - 99 mg/dL    Comment: Glucose reference range applies only to samples taken after fasting for at least 8 hours.  Glucose, capillary     Status: None   Collection Time: 07/13/22  7:29 AM  Result Value Ref Range   Glucose-Capillary 94 70 - 99 mg/dL    Comment: Glucose reference range applies only to samples taken after fasting for at least 8 hours.  Glucose, capillary     Status: None   Collection Time: 07/13/22 11:35 AM  Result Value Ref Range   Glucose-Capillary 93 70 - 99 mg/dL    Comment: Glucose reference range applies only to samples taken after fasting for at least 8 hours.    Blood Alcohol level:  Lab Results  Component Value Date   ETH <10 07/02/2022   ETH <10 57/10/7791    Metabolic Disorder Labs: Lab Results  Component Value Date   HGBA1C 5.6 03/19/2022   MPG 114.02 03/19/2022   No results found for: "PROLACTIN" Lab Results  Component Value Date   CHOL 131 03/19/2022   TRIG 92 03/19/2022   HDL 33 (L) 03/19/2022   CHOLHDL 4.0 03/19/2022   VLDL 18 03/19/2022   [place] 80 03/19/2022    Physical Findings: AIMS:  , ,  ,  ,    CIWA:  COWS:     Musculoskeletal: Strength & Muscle Tone: within normal limits Gait & Station: unable to stand Patient leans: N/A  Psychiatric Specialty Exam:  Presentation  General Appearance: Appropriate for Environment; Casual  Eye Contact:Fleeting  Speech:Garbled  Speech Volume:Decreased  Handedness:Right   Mood and Affect  Mood:Anxious; Depressed  Affect:Flat   Thought Process  Thought Processes:Linear; Coherent; Goal Directed  Descriptions of Associations:Loose  Orientation:Partial  Thought Content:Scattered  History of Schizophrenia/Schizoaffective disorder:No  Duration of Psychotic Symptoms:No data recorded Hallucinations:No data recorded Ideas of Reference:None  Suicidal Thoughts:No data recorded Homicidal Thoughts:No data recorded  Sensorium  Memory:Recent  Poor; Remote Poor  Judgment:Poor  Insight:Fair   Executive Functions  Concentration:Fair  Attention Span:Poor  Recall:Poor  Fund of Knowledge:Poor  Language:Poor   Psychomotor Activity  Psychomotor Activity:No data recorded  Assets  Assets:Financial Resources/Insurance   Sleep  Sleep:No data recorded    Blood pressure (!) 118/91, pulse 62, temperature 97.7 F (36.5 C), temperature source Oral, resp. rate 16, height '6\' 2"'$  (1.88 m), weight 70.8 kg, SpO2 98 %. Body mass index is 20.03 kg/m.   Treatment Plan Summary: Daily contact with patient to assess and evaluate symptoms and progress in treatment, Medication management, and Plan continue current medications.  Parks Ranger, DO 07/13/2022, 11:54 AM

## 2022-07-13 NOTE — Progress Notes (Signed)
Patient is alert and oriented times 2. Mood and affect appropriate. Patient rates pain as 0/10. He denies SI, HI, and AVH. Patient does endorse some feelings of depression at this time. Patient states he slept well last night. Evening medicines administered whole by mouth without difficulty. Patient ate snack in day room; appetite was fair. Patient remains on unit with Q15 minute checks in place.

## 2022-07-13 NOTE — [facility] Group Notes (Signed)
LCSW Group Therapy Note  07/13/2022    9:50 - 10:44 AM               Type of Therapy and Topic:  Group Therapy: Well-being  Participation Level:  Active   Description of Group:   In this group session, patients learned how to define wellness as well as an opportunity to share their favorite type of pie with fall coming. CSW will share how life is similar to pie (sometimes it stays together and sometimes it falls apart and always has sweet memories). Acknowledgement made to feelings about hospitalization and patients given time to reminisce about a fond or sweet memory. Patients were read the definition of wellness. Patients then discussed types of wellness, examples for each type of wellness and things we can do to have good overall health and wellbeing upon discharge. Patients are encouraged to select one goal for discharge from group discussion.   Therapeutic Goals: . Patient given a chance to share a happy memory to elicit positive mood change.  Patients will explore wellness - the definition, symptoms, types of wellness and examples of how to improve each area of wellness.  Patients will discuss each area of wellness (physical, emotional, spiritual, social, intellectual, etc.) CSW will be sure to place an emphasis on things that fall in each group to help avoid future hospitalizations i.e. taking medication, seeing a primary care provider, support system, coping skills for stress/depression and ways to make the environment after discharge safe and calming.   Patients will also discuss aging some during this group and how to change coping skills as we age (i.e. unable to play basketball but now I can watch it on tv, attend grandkid's games, teach others about it, etc.) and ways to help keep our brain health sharp (puzzles, reading, word finds, etc).  Patients were encouraged to close on a positive note, sharing something they are taking away from this group or something they plan to implement at  discharge.   Summary of Patient Progress:  Patient was engaged and participated the entire group. Patient was mostly an active listener as it took him a long time to respond. Patient shared his favorite type of pie and that his goal is with people to communicate more upon discharge. Patient's peer also believed he shared about music, however CSW was not able to understand if that was what he had said despite asking him to repeat it a few times.   Therapeutic Modalities:   Cognitive Behavioral Therapy Motivational Interviewing  Brief Therapy  Tye Savoy, LCSW  07/13/22

## 2022-07-14 DIAGNOSIS — F332 Major depressive disorder, recurrent severe without psychotic features: Secondary | ICD-10-CM | POA: Diagnosis not present

## 2022-07-14 LAB — GLUCOSE, CAPILLARY
Glucose-Capillary: 116 mg/dL — ABNORMAL HIGH (ref 70–99)
Glucose-Capillary: 144 mg/dL — ABNORMAL HIGH (ref 70–99)
Glucose-Capillary: 207 mg/dL — ABNORMAL HIGH (ref 70–99)
Glucose-Capillary: 73 mg/dL (ref 70–99)

## 2022-07-14 NOTE — Progress Notes (Signed)
[facility] MD Progress Note  07/14/2022 12:05 PM Koltan Portocarrero  MRN:  [id number] Subjective: John [name] is seen on rounds.  He responds appropriately but is delayed and very quiet.  Has been compliant with medications.  No side effects.  Hopefully he will get his first ECT tomorrow.  Principal Problem: Major depressive disorder, recurrent severe without psychotic features ([place]) Diagnosis: Principal Problem:   Major depressive disorder, recurrent severe without psychotic features ([place]) Active Problems:   Pressure injury of skin  Total Time spent with patient: 15 minutes  Past Psychiatric History:  Patient has a history of past depression repeated episodes throughout his life.  1 prior suicide attempt.  Was admitted to the hospital in mid May here with depression.  Was not showing significant improvement over time and was referred for ECT.  Patient received a few ECT treatments which generally appeared to be tolerated well. He did develop an arrhythmia which resolved.  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 06/2015 CT Head: abnl thickening of mid-dist R cervical ICA w/o narrowing - ? vasculitis and thrombosed/healing dissection; b. 01/2021 Carotid U/S: 1-39% bilat ICA stenoses.   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 Age 85 - struck in head w/ baseball --> s/p craniotomy.   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 Approx 2015 - eval in [place] - conservatively managed.    Past Surgical History:   Procedure Laterality Date   ANKLE ARTHROSCOPY Left    fracture   CHOLECYSTECTOMY  2007   [place]   left frontal craniotomy for epidural hematoma   HIP ARTHROPLASTY Right 04/07/2022   Procedure: ARTHROPLASTY BIPOLAR HIP (HEMIARTHROPLASTY);  Surgeon: Earnestine Leys, MD;  Location: [facility] ORS;  Service: Orthopedics;  Laterality: Right;   INSERTION OF MESH  01/02/2022   Procedure: INSERTION OF MESH;  Surgeon: Herbert Pun, MD;  Location: [facility] ORS;  Service: General;;   LIGAMENT REPAIR Right    thumb   RETINAL DETACHMENT SURGERY     TREATMENT FISTULA ANAL  2006   Family History:  Family History  Problem Relation Age of Onset   Osteoporosis Mother    Depression Mother    Prostate cancer Father    Hypertension Father    Diabetes Brother    Kidney disease Neg Hx     Social History:  Social History   Substance and Sexual Activity  Alcohol Use Not Currently     Social History   Substance and Sexual Activity  Drug Use No    Social History   Socioeconomic History   Marital status: Married    Spouse name: [name]   Number of children: 2   Years of education: Not on file   Highest education level: Master's degree (e.g., MA, MS, MEng, MEd, MSW, MBA)  Occupational History  Not on file  Tobacco Use   Smoking status: Never   Smokeless tobacco: Never  Vaping Use   Vaping Use: Never used  Substance and Sexual Activity   Alcohol use: Not Currently   Drug use: No   Sexual activity: Yes    Birth control/protection: None  Other Topics Concern   Not on file  Social History Narrative   Live at home with wife.    Social Determinants of Health   Financial Resource Strain: Not on file  Food Insecurity: Food Insecurity Present (07/09/2022)   Hunger Vital Sign    Worried About Running Out of Food in the Last Year: Sometimes true    Ran Out of Food in the Last Year: Sometimes true  Transportation Needs: No Transportation Needs (07/09/2022)   PRAPARE - Armed forces logistics/support/administrative officer (Medical): No    Lack of Transportation (Non-Medical): No  Physical Activity: Not on file  Stress: Not on file  Social Connections: Not on file   Additional Social History:                         Sleep: Good  Appetite:  Fair  Current Medications: Current Facility-Administered Medications  Medication Dose Route Frequency Provider Last Rate Last Admin   acetaminophen (TYLENOL) tablet 650 mg  650 mg Oral Q6H PRN Patrecia Pour, NP   650 mg at 07/11/22 2151   alum & mag hydroxide-simeth (MAALOX/MYLANTA) 200-200-20 MG/5ML suspension 30 mL  30 mL Oral Q4H PRN Patrecia Pour, NP       ascorbic acid (VITAMIN C) tablet 500 mg  500 mg Oral BID Parks Ranger, DO   500 mg at 07/14/22 2841   aspirin EC tablet 81 mg  81 mg Oral Daily Patrecia Pour, NP   81 mg at 07/14/22 3244   buPROPion (WELLBUTRIN XL) 24 hr tablet 300 mg  300 mg Oral Daily Patrecia Pour, NP   300 mg at 07/14/22 0839   docusate sodium (COLACE) capsule 100 mg  100 mg Oral BID Patrecia Pour, NP   100 mg at 07/14/22 0102   insulin aspart (novoLOG) injection 0-6 Units  0-6 Units Subcutaneous TID WC Parks Ranger, DO   1 Units at 07/13/22 1620   liver oil-zinc oxide (DESITIN) 40 % ointment   Topical BID Parks Ranger, DO   Given at 07/13/22 2109   magnesium hydroxide (MILK OF MAGNESIA) suspension 30 mL  30 mL Oral Daily PRN Patrecia Pour, NP       methocarbamol (ROBAXIN) tablet 500 mg  500 mg Oral Q6H PRN Patrecia Pour, NP       metoprolol tartrate (LOPRESSOR) tablet 12.5 mg  12.5 mg Oral BID Parks Ranger, DO   12.5 mg at 07/14/22 0838   modafinil (PROVIGIL) tablet 100 mg  100 mg Oral QPC breakfast Parks Ranger, DO   100 mg at 07/14/22 7253   multivitamin with minerals tablet 1 tablet  1 tablet Oral Daily Parks Ranger, DO   1 tablet at 07/14/22 0839   OLANZapine (ZYPREXA) tablet 10 mg  10 mg Oral QHS Parks Ranger, DO    10 mg at 07/13/22 2110   polyethylene glycol (MIRALAX / GLYCOLAX) packet 17 g  17 g Oral Daily PRN Patrecia Pour, NP       protein supplement (ENSURE MAX) liquid  11 oz Oral BID Parks Ranger,  DO   11 oz at 07/14/22 0840   senna (SENOKOT) tablet 8.6 mg  1 tablet Oral BID Patrecia Pour, NP   8.6 mg at 07/14/22 0840   sertraline (ZOLOFT) tablet 50 mg  50 mg Oral QHS Patrecia Pour, NP   50 mg at 07/13/22 2110   tamsulosin (FLOMAX) capsule 0.4 mg  0.4 mg Oral QPC supper Patrecia Pour, NP   0.4 mg at 07/13/22 1723   traZODone (DESYREL) tablet 50 mg  50 mg Oral QHS PRN Patrecia Pour, NP   50 mg at 07/12/22 2127    Lab Results:  Results for orders placed or performed during the hospital encounter of 07/04/22 (from the past 48 hour(s))  Glucose, capillary     Status: Abnormal   Collection Time: 07/12/22  4:28 PM  Result Value Ref Range   Glucose-Capillary 149 (H) 70 - 99 mg/dL    Comment: Glucose reference range applies only to samples taken after fasting for at least 8 hours.  Glucose, capillary     Status: None   Collection Time: 07/12/22  7:28 PM  Result Value Ref Range   Glucose-Capillary 89 70 - 99 mg/dL    Comment: Glucose reference range applies only to samples taken after fasting for at least 8 hours.  Glucose, capillary     Status: None   Collection Time: 07/13/22  7:29 AM  Result Value Ref Range   Glucose-Capillary 94 70 - 99 mg/dL    Comment: Glucose reference range applies only to samples taken after fasting for at least 8 hours.  Glucose, capillary     Status: None   Collection Time: 07/13/22 11:35 AM  Result Value Ref Range   Glucose-Capillary 93 70 - 99 mg/dL    Comment: Glucose reference range applies only to samples taken after fasting for at least 8 hours.  Glucose, capillary     Status: Abnormal   Collection Time: 07/13/22  4:14 PM  Result Value Ref Range   Glucose-Capillary 193 (H) 70 - 99 mg/dL    Comment: Glucose reference range applies only to  samples taken after fasting for at least 8 hours.  Glucose, capillary     Status: Abnormal   Collection Time: 07/13/22  8:09 PM  Result Value Ref Range   Glucose-Capillary 100 (H) 70 - 99 mg/dL    Comment: Glucose reference range applies only to samples taken after fasting for at least 8 hours.  Glucose, capillary     Status: Abnormal   Collection Time: 07/14/22  7:50 AM  Result Value Ref Range   Glucose-Capillary 116 (H) 70 - 99 mg/dL    Comment: Glucose reference range applies only to samples taken after fasting for at least 8 hours.   Comment 1 Notify RN   Glucose, capillary     Status: Abnormal   Collection Time: 07/14/22 11:30 AM  Result Value Ref Range   Glucose-Capillary 144 (H) 70 - 99 mg/dL    Comment: Glucose reference range applies only to samples taken after fasting for at least 8 hours.   Comment 1 Notify RN     Blood Alcohol level:  Lab Results  Component Value Date   ETH <10 07/02/2022   ETH <10 12/45/8099    Metabolic Disorder Labs: Lab Results  Component Value Date   HGBA1C 5.6 03/19/2022   MPG 114.02 03/19/2022   No results found for: "PROLACTIN" Lab Results  Component Value Date   CHOL 131 03/19/2022  TRIG 92 03/19/2022   HDL 33 (L) 03/19/2022   CHOLHDL 4.0 03/19/2022   VLDL 18 03/19/2022   LDLCALC 80 03/19/2022    Physical Findings: AIMS:  , ,  ,  ,    CIWA:    COWS:     Musculoskeletal: Strength & Muscle Tone: within normal limits Gait & Station: unable to stand Patient leans: N/A  Psychiatric Specialty Exam:  Presentation  General Appearance: Appropriate for Environment; Casual  Eye Contact:Fleeting  Speech:Garbled  Speech Volume:Decreased  Handedness:Right   Mood and Affect  Mood:Anxious; Depressed  Affect:Flat   Thought Process  Thought Processes:Linear; Coherent; Goal Directed  Descriptions of Associations:Loose  Orientation:Partial  Thought Content:Scattered  History of Schizophrenia/Schizoaffective  disorder:No  Duration of Psychotic Symptoms:No data recorded Hallucinations:No data recorded Ideas of Reference:None  Suicidal Thoughts:No data recorded Homicidal Thoughts:No data recorded  Sensorium  Memory:Recent Poor; Remote Poor  Judgment:Poor  Insight:Fair   Executive Functions  Concentration:Fair  Attention Span:Poor  Recall:Poor  Fund of Knowledge:Poor  Language:Poor   Psychomotor Activity  Psychomotor Activity:No data recorded  Assets  Assets:Financial Resources/Insurance   Sleep  Sleep:No data recorded    Blood pressure 129/81, pulse 65, temperature 98 F (36.7 C), resp. rate 18, height '6\' 2"'$  (1.88 m), weight 70.8 kg, SpO2 99 %. Body mass index is 20.03 kg/m.   Treatment Plan Summary: Daily contact with patient to assess and evaluate symptoms and progress in treatment, Medication management, and Plan continue current medications.  Parks Ranger, DO 07/14/2022, 12:05 PM

## 2022-07-14 NOTE — Progress Notes (Signed)
Patient presents with sad affect.  Alert and oriented x 3.  Minimal verbal response from patient.  Patient does endorse feelings of depression.  Denies SI/HI and AVH.    Scheduled medications administered as ordered.  No adverse effects noted.    15 min checks in place.  Patient remains safe on the unit.  Patient is present in the milieu with limited interaction with limited interaction with peers.

## 2022-07-14 NOTE — Group Note (Signed)
LCSW Group Therapy Note  Group Date: 07/14/2022 Start Time: 1500 End Time: 1545   Type of Therapy and Topic:  Group Therapy - Healthy vs Unhealthy Coping Skills  Participation Level:  Active   Description of Group The focus of this group was to determine what unhealthy coping techniques typically are used by group members and what healthy coping techniques would be helpful in coping with various problems. Patients were guided in becoming aware of the differences between healthy and unhealthy coping techniques. Patients were asked to identify 2-3 healthy coping skills they would like to learn to use more effectively.  Therapeutic Goals Patients learned that coping is what human beings do all day long to deal with various situations in their lives Patients defined and discussed healthy vs unhealthy coping techniques Patients identified their preferred coping techniques and identified whether these were healthy or unhealthy Patients determined 2-3 healthy coping skills they would like to become more familiar with and use more often. Patients provided support and ideas to each other   Summary of Patient Progress:  During group, John [name] expressed that he understands how to use coping skills but at times is unable to use them. Patient proved open to input from peers and feedback from [place]. Patient demonstrated fair insight into the subject matter, was respectful of peers, and participated throughout the entire session.   Therapeutic Modalities Cognitive Behavioral Therapy Motivational Interviewing  [place], [place] 07/14/2022  4:13 PM

## 2022-07-14 NOTE — Progress Notes (Addendum)
Patient's wife upset that the patient's glasses are missing.   This Probation officer is unsure if the patient has glasses upon admission.   *After looking at patient's chart, there were no glasses with the patient upon admission to the unit.

## 2022-07-14 NOTE — Plan of Care (Signed)
  Problem: Self-Concept: Goal: Level of anxiety will decrease Outcome: Progressing   Problem: Education: Goal: Mental status will improve Outcome: Not Progressing   Problem: Coping: Goal: Will verbalize feelings Outcome: Not Progressing

## 2022-07-14 NOTE — Progress Notes (Signed)
Patient is alert and oriented times 2. Mood and affect appropriate. Patient rates pain as 0/10. He denies SI, HI, and AVH. Patient does not endorse feelings of anxiety and depression at this time. Patient states he slept well last night. Evening medicines administered whole by mouth without difficulty. Patient ate snack in day room; appetite was fair. Patient was assisted to ambulate to the restroom in the dayroom prior to going to bed. Patient remains on unit with Q15 minute checks in place.

## 2022-07-15 ENCOUNTER — Ambulatory Visit: Payer: Medicare Other

## 2022-07-15 ENCOUNTER — Inpatient Hospital Stay: Payer: Medicare Other | Admitting: Anesthesiology

## 2022-07-15 ENCOUNTER — Encounter: Payer: Self-pay | Admitting: Psychiatry

## 2022-07-15 DIAGNOSIS — F332 Major depressive disorder, recurrent severe without psychotic features: Secondary | ICD-10-CM | POA: Diagnosis not present

## 2022-07-15 LAB — GLUCOSE, CAPILLARY
Glucose-Capillary: 93 mg/dL (ref 70–99)
Glucose-Capillary: 106 mg/dL — ABNORMAL HIGH (ref 70–99)
Glucose-Capillary: 114 mg/dL — ABNORMAL HIGH (ref 70–99)
Glucose-Capillary: 147 mg/dL — ABNORMAL HIGH (ref 70–99)

## 2022-07-15 MED ORDER — KETAMINE HCL 10 MG/ML IJ SOLN
INTRAMUSCULAR | Status: DC | PRN
  Administered 2022-07-15: 100 mg via INTRAVENOUS

## 2022-07-15 MED ORDER — SUCCINYLCHOLINE CHLORIDE 200 MG/10ML IV SOSY
PREFILLED_SYRINGE | INTRAVENOUS | Status: DC | PRN
  Administered 2022-07-15: 100 mg via INTRAVENOUS

## 2022-07-15 MED ORDER — KETAMINE HCL 50 MG/5ML IJ SOSY
PREFILLED_SYRINGE | INTRAMUSCULAR | Status: AC
  Filled 2022-07-15: qty 5

## 2022-07-15 MED ORDER — MIDAZOLAM HCL 2 MG/2ML IJ SOLN
INTRAMUSCULAR | Status: AC
  Filled 2022-07-15: qty 2

## 2022-07-15 MED ORDER — SODIUM CHLORIDE 0.9 % IV SOLN: 500.0000 mL | Freq: Once | INTRAVENOUS | Status: AC

## 2022-07-15 MED ORDER — HYDRALAZINE HCL 20 MG/ML IJ SOLN
10.0000 mg | Freq: Once | INTRAMUSCULAR | Status: AC
  Administered 2022-07-15: 10 mg via INTRAVENOUS

## 2022-07-15 MED ORDER — SUCCINYLCHOLINE CHLORIDE 200 MG/10ML IV SOSY
PREFILLED_SYRINGE | INTRAVENOUS | Status: AC
  Filled 2022-07-15: qty 10

## 2022-07-15 MED ORDER — KETOROLAC TROMETHAMINE 30 MG/ML IJ SOLN: 30.0000 mg | Freq: Once | INTRAMUSCULAR | Status: AC

## 2022-07-15 MED ORDER — GLYCOPYRROLATE 0.2 MG/ML IJ SOLN
0.1000 mg | Freq: Once | INTRAMUSCULAR | Status: AC
  Administered 2022-07-19: 0.1 mg via INTRAVENOUS
  Filled 2022-07-15: qty 0.5

## 2022-07-15 MED ORDER — HYDRALAZINE HCL 20 MG/ML IJ SOLN
INTRAMUSCULAR | Status: AC
  Filled 2022-07-15: qty 1

## 2022-07-15 MED ORDER — MIDAZOLAM HCL 2 MG/2ML IJ SOLN
INTRAMUSCULAR | Status: DC | PRN
  Administered 2022-07-15: 2 mg via INTRAVENOUS

## 2022-07-15 MED ORDER — METHOHEXITAL SODIUM 0.5 G IJ SOLR
INTRAMUSCULAR | Status: AC
  Filled 2022-07-15: qty 500

## 2022-07-15 NOTE — Transfer of Care (Signed)
Immediate Anesthesia Transfer of Care Note  Patient: John [name]  Procedure(s) Performed: ECT TX  Patient Location: PACU  Anesthesia Type:General  Level of Consciousness: sedated  Airway & Oxygen Therapy: Patient Spontanous Breathing and Patient connected to face mask oxygen  Post-op Assessment: Report given to RN and Post -op Vital signs reviewed and stable  Post vital signs: Reviewed and stable  Last Vitals:  Vitals Value Taken Time  BP    Temp    Pulse    Resp    SpO2      Last Pain:  Vitals:   07/15/22 1144  TempSrc:   PainSc: 0-No pain         Complications: No notable events documented.

## 2022-07-15 NOTE — Progress Notes (Signed)
Physical Therapy Treatment Patient Details Name: John [name] MRN: [id number] DOB: [birth year] Today's Date: 07/15/2022   History of Present Illness Pt is a 81 y/o M who was voluntarily admitted to geriatric psychiatry for worsening depression. Pt was d/c from the unit ~2 months ago & was noncompliatn with medications. PMH: CA, depression, DM2, epidural hematoma s/p craniotomy (age 74), Horner syndrome, HTN, migraine, obesity, sleep apnea    PT Comments    Pt in dayroom awaiting transport to ECT.  He does agree to gait while waiting.  Stood with min a x 1 and is able to walk 150' with RW and min guard/a x 1.  He does have one LOB to right with min a x to recover.  Tech arrived to get pt and he was assisted in transport chair for treatment.  Pt very quiet today and slow to respond/soft voice.  Difficult to understand at times.  Session short due to procedure.   Recommendations for follow up therapy are one component of a multi-disciplinary discharge planning process, led by the attending physician.  Recommendations may be updated based on patient status, additional functional criteria and insurance authorization.  Follow Up Recommendations  Home health PT     Assistance Recommended at Discharge Frequent or constant Supervision/Assistance  Patient can return home with the following A little help with walking and/or transfers;A little help with bathing/dressing/bathroom;Assistance with cooking/housework;Direct supervision/assist for financial management;Assist for transportation;Help with stairs or ramp for entrance;Direct supervision/assist for medications management   Equipment Recommendations  Rolling walker (2 wheels)    Recommendations for Other Services       Precautions / Restrictions Precautions Precautions: Fall Restrictions Weight Bearing Restrictions: No     Mobility  Bed Mobility               General bed mobility comments: in recliner    Transfers Overall  transfer level: Needs assistance Equipment used: Rolling walker (2 wheels) Transfers: Sit to/from Stand Sit to Stand: Min assist                Ambulation/Gait Ambulation/Gait assistance: Min guard, Min assist Gait Distance (Feet): 150 Feet Assistive device: Rolling walker (2 wheels) Gait Pattern/deviations: Step-through pattern, Decreased step length - right, Decreased step length - left Gait velocity: decreased     General Gait Details: slow gait using walker for support today.  one LOB to rigth with min a to recover.   Stairs             Wheelchair Mobility    Modified Rankin (Stroke Patients Only)       Balance Overall balance assessment: Needs assistance Sitting-balance support: No upper extremity supported, Feet supported       Standing balance support: No upper extremity supported, During functional activity Standing balance-Leahy Scale: Fair Standing balance comment: hx of falls                            Cognition Arousal/Alertness: Awake/alert Behavior During Therapy: Flat affect Overall Cognitive Status: Within Functional Limits for tasks assessed                                          Exercises      General Comments        Pertinent Vitals/Pain Pain Assessment Pain Assessment: No/denies pain    Home Living  Prior Function            PT Goals (current goals can now be found in the care plan section) Progress towards PT goals: Progressing toward goals    Frequency    Min 2X/week      PT Plan Current plan remains appropriate    Co-evaluation              AM-PAC PT "6 Clicks" Mobility   Outcome Measure  Help needed turning from your back to your side while in a flat bed without using bedrails?: None Help needed moving from lying on your back to sitting on the side of a flat bed without using bedrails?: A Little Help needed moving to and from a bed  to a chair (including a wheelchair)?: A Little Help needed standing up from a chair using your arms (e.g., wheelchair or bedside chair)?: A Little Help needed to walk in hospital room?: A Little Help needed climbing 3-5 steps with a railing? : A Little 6 Click Score: 19    End of Session Equipment Utilized During Treatment: Gait belt Activity Tolerance: Patient tolerated treatment well Patient left: in chair;Other (comment) Nurse Communication: Mobility status PT Visit Diagnosis: Unsteadiness on feet (R26.81);Muscle weakness (generalized) (M62.81)     Time: 7544-9201 PT Time Calculation (min) (ACUTE ONLY): 8 min  Charges:  $Gait Training: 8-22 mins                   Chesley Noon, PTA 07/15/22, 12:49 PM

## 2022-07-15 NOTE — H&P (Signed)
John [name] is an 81 y.o. male.   Chief Complaint: Severe depression and anxiety HPI: History of recurrent depression.  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 06/2015 CT Head: abnl thickening of mid-dist R cervical ICA w/o narrowing - ? vasculitis and thrombosed/healing dissection; b. 01/2021 Carotid U/S: 1-39% bilat ICA stenoses.   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 Age 21 - struck in head w/ baseball --> s/p craniotomy.   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 Approx 2015 - eval in [place] - conservatively managed.    Past Surgical History:  Procedure Laterality Date   ANKLE ARTHROSCOPY Left    fracture   CHOLECYSTECTOMY  2007   [place]   left frontal craniotomy for epidural hematoma   HIP ARTHROPLASTY Right 04/07/2022   Procedure: ARTHROPLASTY BIPOLAR HIP (HEMIARTHROPLASTY);  Surgeon: Earnestine Leys, MD;  Location: [facility] ORS;  Service: Orthopedics;  Laterality: Right;   INSERTION OF MESH  01/02/2022   Procedure: INSERTION OF MESH;  Surgeon: Herbert Pun, MD;  Location: [facility] ORS;  Service: General;;   LIGAMENT REPAIR Right    thumb   RETINAL DETACHMENT SURGERY     TREATMENT FISTULA ANAL  2006    Family History  Problem Relation Age of Onset   Osteoporosis Mother    Depression Mother    Prostate cancer Father    Hypertension Father    Diabetes Brother    Kidney disease Neg Hx    Social History:  reports that he  has never smoked. He has never used smokeless tobacco. He reports that he does not currently use alcohol. He reports that he does not use drugs.  Allergies: No Known Allergies  Medications Prior to Admission  Medication Sig Dispense Refill   aspirin EC 81 MG tablet Take 81 mg by mouth daily.     buPROPion (WELLBUTRIN XL) 300 MG 24 hr tablet Take 1 tablet by mouth daily.     docusate sodium (COLACE) 100 MG capsule Take 1 capsule (100 mg total) by mouth 2 (two) times daily. 10 capsule 0   methocarbamol (ROBAXIN) 500 MG tablet Take 1 tablet (500 mg total) by mouth every 6 (six) hours as needed for muscle spasms. 30 tablet 0   metoprolol tartrate (LOPRESSOR) 25 MG tablet Take 1 tablet (25 mg total) by mouth 2 (two) times daily. 60 tablet 3   polyethylene glycol (MIRALAX / GLYCOLAX) 17 g packet Take 17 g by mouth daily as needed. 14 each 0   senna (SENOKOT) 8.6 MG TABS tablet Take 1 tablet (8.6 mg total) by mouth 2 (two) times daily. 120 tablet 0   sertraline (ZOLOFT) 50 MG tablet Take 1 tablet (50 mg total) by mouth at bedtime. 30 tablet 0  sertraline (ZOLOFT) 50 MG tablet Take 1 tablet (50 mg total) by mouth daily. 30 tablet 1   tamsulosin (FLOMAX) 0.4 MG CAPS capsule Take 1 capsule (0.4 mg total) by mouth daily after supper. 30 capsule 3   traZODone (DESYREL) 50 MG tablet Take 1 tablet (50 mg total) by mouth at bedtime as needed for sleep. 30 tablet 3    Results for orders placed or performed during the hospital encounter of 07/04/22 (from the past 48 hour(s))  Glucose, capillary     Status: Abnormal   Collection Time: 07/13/22  4:14 PM  Result Value Ref Range   Glucose-Capillary 193 (H) 70 - 99 mg/dL    Comment: Glucose reference range applies only to samples taken after fasting for at least 8 hours.  Glucose, capillary     Status: Abnormal   Collection Time: 07/13/22  8:09 PM  Result Value Ref Range   Glucose-Capillary 100 (H) 70 - 99 mg/dL    Comment: Glucose reference range applies  only to samples taken after fasting for at least 8 hours.  Glucose, capillary     Status: Abnormal   Collection Time: 07/14/22  7:50 AM  Result Value Ref Range   Glucose-Capillary 116 (H) 70 - 99 mg/dL    Comment: Glucose reference range applies only to samples taken after fasting for at least 8 hours.   Comment 1 Notify RN   Glucose, capillary     Status: Abnormal   Collection Time: 07/14/22 11:30 AM  Result Value Ref Range   Glucose-Capillary 144 (H) 70 - 99 mg/dL    Comment: Glucose reference range applies only to samples taken after fasting for at least 8 hours.   Comment 1 Notify RN   Glucose, capillary     Status: Abnormal   Collection Time: 07/14/22  4:15 PM  Result Value Ref Range   Glucose-Capillary 207 (H) 70 - 99 mg/dL    Comment: Glucose reference range applies only to samples taken after fasting for at least 8 hours.  Glucose, capillary     Status: None   Collection Time: 07/14/22  7:33 PM  Result Value Ref Range   Glucose-Capillary 73 70 - 99 mg/dL    Comment: Glucose reference range applies only to samples taken after fasting for at least 8 hours.  Glucose, capillary     Status: None   Collection Time: 07/15/22  7:32 AM  Result Value Ref Range   Glucose-Capillary 93 70 - 99 mg/dL    Comment: Glucose reference range applies only to samples taken after fasting for at least 8 hours.   No results found.  Review of Systems  Constitutional: Negative.   HENT: Negative.    Eyes: Negative.   Respiratory: Negative.    Cardiovascular: Negative.   Gastrointestinal: Negative.   Musculoskeletal: Negative.   Skin: Negative.   Neurological: Negative.   Psychiatric/Behavioral:  Positive for confusion and dysphoric mood.   All other systems reviewed and are negative.   Blood pressure 136/86, pulse 63, temperature 98.7 F (37.1 C), resp. rate 13, height '6\' 2"'$  (1.88 m), weight 70.8 kg, SpO2 99 %. Physical Exam Constitutional:      Appearance: He is well-developed. He is  ill-appearing.  HENT:     Head: Normocephalic and atraumatic.  Eyes:     Conjunctiva/sclera: Conjunctivae normal.     Pupils: Pupils are equal, round, and reactive to light.  Cardiovascular:     Heart sounds: Normal heart sounds.  Pulmonary:  Effort: Pulmonary effort is normal.  Abdominal:     Palpations: Abdomen is soft.  Musculoskeletal:        General: Normal range of motion.     Cervical back: Normal range of motion.  Skin:    General: Skin is warm and dry.  Neurological:     Mental Status: He is alert.  Psychiatric:        Attention and Perception: He is inattentive.        Mood and Affect: Mood is depressed. Affect is blunt.        Speech: Speech is delayed.        Behavior: Behavior is slowed.      Assessment/Plan Recommend restarting treatment with right unilateral index once again beginning today  [name], MD 07/15/2022, 2:41 PM

## 2022-07-15 NOTE — BH IP Treatment Plan (Signed)
Interdisciplinary Treatment and Diagnostic Plan Update  07/15/2022 Time of Session: 9:15AM John [name] MRN: [id number]  Principal Diagnosis: Major depressive disorder, recurrent severe without psychotic features (John [name])  Secondary Diagnoses: Principal Problem:   Major depressive disorder, recurrent severe without psychotic features (John [name]) Active Problems:   Pressure injury of skin   Current Medications:  Current Facility-Administered Medications  Medication Dose Route Frequency Provider Last Rate Last Admin   acetaminophen (TYLENOL) tablet 650 mg  650 mg Oral Q6H PRN Patrecia Pour, NP   650 mg at 07/14/22 1317   alum & mag hydroxide-simeth (MAALOX/MYLANTA) 200-200-20 MG/5ML suspension 30 mL  30 mL Oral Q4H PRN Patrecia Pour, NP       ascorbic acid (VITAMIN C) tablet 500 mg  500 mg Oral BID Parks Ranger, DO   500 mg at 07/15/22 7829   aspirin EC tablet 81 mg  81 mg Oral Daily Patrecia Pour, NP   81 mg at 07/15/22 5621   buPROPion (WELLBUTRIN XL) 24 hr tablet 300 mg  300 mg Oral Daily Patrecia Pour, NP   300 mg at 07/15/22 3086   docusate sodium (COLACE) capsule 100 mg  100 mg Oral BID Patrecia Pour, NP   100 mg at 07/15/22 5784   insulin aspart (novoLOG) injection 0-6 Units  0-6 Units Subcutaneous TID WC Parks Ranger, DO   2 Units at 07/14/22 1646   liver oil-zinc oxide (DESITIN) 40 % ointment   Topical BID Parks Ranger, DO   Given at 07/14/22 2109   magnesium hydroxide (MILK OF MAGNESIA) suspension 30 mL  30 mL Oral Daily PRN Patrecia Pour, NP       methocarbamol (ROBAXIN) tablet 500 mg  500 mg Oral Q6H PRN Patrecia Pour, NP       metoprolol tartrate (LOPRESSOR) tablet 12.5 mg  12.5 mg Oral BID Parks Ranger, DO   12.5 mg at 07/15/22 6962   modafinil (PROVIGIL) tablet 100 mg  100 mg Oral QPC breakfast Parks Ranger, DO   100 mg at 07/15/22 9528   multivitamin with minerals tablet 1 tablet  1 tablet Oral Daily Parks Ranger, DO   1 tablet at 07/15/22 0822   OLANZapine (ZYPREXA) tablet 10 mg  10 mg Oral QHS Parks Ranger, DO   10 mg at 07/14/22 2107   polyethylene glycol (MIRALAX / GLYCOLAX) packet 17 g  17 g Oral Daily PRN Patrecia Pour, NP       protein supplement (ENSURE MAX) liquid  11 oz Oral BID Parks Ranger, DO   8 oz at 07/14/22 2106   senna (SENOKOT) tablet 8.6 mg  1 tablet Oral BID Patrecia Pour, NP   8.6 mg at 07/15/22 0825   sertraline (ZOLOFT) tablet 50 mg  50 mg Oral QHS Patrecia Pour, NP   50 mg at 07/14/22 2109   tamsulosin (FLOMAX) capsule 0.4 mg  0.4 mg Oral QPC supper Patrecia Pour, NP   0.4 mg at 07/14/22 1646   traZODone (DESYREL) tablet 50 mg  50 mg Oral QHS PRN Patrecia Pour, NP   50 mg at 07/12/22 2127   PTA Medications: Medications Prior to Admission  Medication Sig Dispense Refill Last Dose   aspirin EC 81 MG tablet Take 81 mg by mouth daily.      buPROPion (WELLBUTRIN XL) 300 MG 24 hr tablet Take 1 tablet by mouth daily.  docusate sodium (COLACE) 100 MG capsule Take 1 capsule (100 mg total) by mouth 2 (two) times daily. 10 capsule 0    methocarbamol (ROBAXIN) 500 MG tablet Take 1 tablet (500 mg total) by mouth every 6 (six) hours as needed for muscle spasms. 30 tablet 0    metoprolol tartrate (LOPRESSOR) 25 MG tablet Take 1 tablet (25 mg total) by mouth 2 (two) times daily. 60 tablet 3    polyethylene glycol (MIRALAX / GLYCOLAX) 17 g packet Take 17 g by mouth daily as needed. 14 each 0    senna (SENOKOT) 8.6 MG TABS tablet Take 1 tablet (8.6 mg total) by mouth 2 (two) times daily. 120 tablet 0    sertraline (ZOLOFT) 50 MG tablet Take 1 tablet (50 mg total) by mouth at bedtime. 30 tablet 0    sertraline (ZOLOFT) 50 MG tablet Take 1 tablet (50 mg total) by mouth daily. 30 tablet 1    tamsulosin (FLOMAX) 0.4 MG CAPS capsule Take 1 capsule (0.4 mg total) by mouth daily after supper. 30 capsule 3    traZODone (DESYREL) 50 MG tablet Take 1 tablet (50 mg  total) by mouth at bedtime as needed for sleep. 30 tablet 3     Patient Stressors: Marital or family conflict   Medication change or noncompliance    Patient Strengths: Scientist, research (life sciences)  Supportive family/friends   Treatment Modalities: Medication Management, Group therapy, Case management,  1 to 1 session with clinician, Psychoeducation, Recreational therapy.   Physician Treatment Plan for Primary Diagnosis: Major depressive disorder, recurrent severe without psychotic features (John [name]) Long Term Goal(s): Improvement in symptoms so as ready for discharge   Short Term Goals: Ability to identify changes in lifestyle to reduce recurrence of condition will improve Ability to verbalize feelings will improve Ability to disclose and discuss suicidal ideas Ability to demonstrate self-control will improve Ability to identify and develop effective coping behaviors will improve Ability to maintain clinical measurements within normal limits will improve Compliance with prescribed medications will improve Ability to identify triggers associated with substance abuse/mental health issues will improve  Medication Management: Evaluate patient's response, side effects, and tolerance of medication regimen.  Therapeutic Interventions: 1 to 1 sessions, Unit Group sessions and Medication administration.  Evaluation of Outcomes: Progressing  Physician Treatment Plan for Secondary Diagnosis: Principal Problem:   Major depressive disorder, recurrent severe without psychotic features (John [name]) Active Problems:   Pressure injury of skin  Long Term Goal(s): Improvement in symptoms so as ready for discharge   Short Term Goals: Ability to identify changes in lifestyle to reduce recurrence of condition will improve Ability to verbalize feelings will improve Ability to disclose and discuss suicidal ideas Ability to demonstrate self-control will improve Ability to identify and develop effective coping behaviors  will improve Ability to maintain clinical measurements within normal limits will improve Compliance with prescribed medications will improve Ability to identify triggers associated with substance abuse/mental health issues will improve     Medication Management: Evaluate patient's response, side effects, and tolerance of medication regimen.  Therapeutic Interventions: 1 to 1 sessions, Unit Group sessions and Medication administration.  Evaluation of Outcomes: Progressing   RN Treatment Plan for Primary Diagnosis: Major depressive disorder, recurrent severe without psychotic features ([place]) Long Term Goal(s): Knowledge of disease and therapeutic regimen to maintain health will improve  Short Term Goals: Ability to demonstrate self-control, Ability to participate in decision making will improve, Ability to verbalize feelings will improve, Ability to disclose and discuss suicidal ideas, Ability  to identify and develop effective coping behaviors will improve, and Compliance with prescribed medications will improve  Medication Management: RN will administer medications as ordered by provider, will assess and evaluate patient's response and provide education to patient for prescribed medication. RN will report any adverse and/or side effects to prescribing provider.  Therapeutic Interventions: 1 on 1 counseling sessions, Psychoeducation, Medication administration, Evaluate responses to treatment, Monitor vital signs and CBGs as ordered, Perform/monitor CIWA, COWS, AIMS and Fall Risk screenings as ordered, Perform wound care treatments as ordered.  Evaluation of Outcomes: Progressing   LCSW Treatment Plan for Primary Diagnosis: Major depressive disorder, recurrent severe without psychotic features ([place]) Long Term Goal(s): Safe transition to appropriate next level of care at discharge, Engage patient in therapeutic group addressing interpersonal concerns.  Short Term Goals: Engage patient in  aftercare planning with referrals and resources, Increase social support, Increase ability to appropriately verbalize feelings, Increase emotional regulation, Facilitate acceptance of mental health diagnosis and concerns, and Increase skills for wellness and recovery  Therapeutic Interventions: Assess for all discharge needs, 1 to 1 time with Social worker, Explore available resources and support systems, Assess for adequacy in community support network, Educate family and significant other(s) on suicide prevention, Complete Psychosocial Assessment, Interpersonal group therapy.  Evaluation of Outcomes: Progressing   Progress in Treatment: Attending groups: Yes. Participating in groups: Yes. Taking medication as prescribed: Yes. Toleration medication: Yes. Family/Significant other contact made: Yes, individual(s) contacted:  SPE completed with the patient's wife. Patient understands diagnosis: Yes. Discussing patient identified problems/goals with staff: Yes. Medical problems stabilized or resolved: Yes. Denies suicidal/homicidal ideation: Yes. Issues/concerns per patient self-inventory: No. Other: none  New problem(s) identified: No, Describe:  none Update 07/10/2022:  No changes at this time. Update 07/15/2022:  No changes at this time.    New Short Term/Long Term Goal(s):  medication management for mood stabilization; elimination of SI thoughts; development of comprehensive mental wellness plan.  Update 07/10/2022:  No changes at this time. Update 07/15/2022:  No changes at this time.    Patient Goals:  "get better"Update 07/10/2022:  No changes at this time. Update 07/15/2022:  No changes at this time.    Discharge Plan or Barriers: CSW to assist patient in development of appropriate discharge plans. Patient struggled with speech during treatment team. Update 07/10/2022:  Patient reports plans to return home.  Patient reports that he would like to continue with his current mental health  provider.  Patient continues to work with PT/OT and has plans to begin ECT.  Changes to discharge treatment pending recommendations of other disciplines working with the patient. Update 07/15/2022:  No changes at this time.    Reason for Continuation of Hospitalization: Anxiety Depression Medical Issues Medication stabilization   Estimated Length of Stay:  1-7 days Update 07/10/2022:  No changes at this time.  Update 07/15/2022:  TBD  Last 3 [place] Suicide Severity Risk Score: Flowsheet Row Admission (Current) from 07/04/2022 in [facility] ED from 07/03/2022 in [facility] Office Visit from 06/11/2022 in confidential department  C-SSRS RISK CATEGORY No Risk No Risk Error: Q3, 4, or 5 should not be populated when Q2 is No       Last PHQ 2/9 Scores:    06/11/2022    2:20 PM 05/20/2022    1:11 PM 12/21/2021    8:59 AM  Depression screen PHQ 2/9  Decreased Interest 3 1 0  Down, Depressed, Hopeless 1 1 0  PHQ -  2 Score 4 2 0  Altered sleeping 1 1   Tired, decreased energy 1 2   Change in appetite 2 1   Feeling bad or failure about yourself  1 1   Trouble concentrating 2 2   Moving slowly or fidgety/restless 2 2   Suicidal thoughts 0 0   PHQ-9 Score 13 11   Difficult doing work/chores Somewhat difficult      Scribe for Treatment Team: Rozann Lesches, Marlinda Mike 07/15/2022 9:16 AM

## 2022-07-15 NOTE — Consult Note (Signed)
WOC consulted for pressure injury;buttock wounds See consult note from 9/8 for same, orders verified in the computer. Appropriate topical care ordered.   Re consult if needed, will not follow at this time. Thanks  Richie Vadala [employer], RN,CWOCN, CNS, [place] [phone])

## 2022-07-15 NOTE — Progress Notes (Signed)
Pt ambulated to bathroom, peri care performed

## 2022-07-15 NOTE — Progress Notes (Signed)
[facility] MD Progress Note  07/15/2022 9:58 AM John [name]  MRN:  [id number]  Subjective: Patient is a 81 yo male with a history of depression. He is seen on rounds today. He responds appropriately but responses are soft and delayed. Has been compliant with medications. No side effects reported. Plan is for ECT today.  Principal Problem: Severe recurrent major depression without psychotic features ([place]) Diagnosis: Principal Problem:   Severe recurrent major depression without psychotic features ([place]) Active Problems:   Major depressive disorder, recurrent severe without psychotic features ([place])   Pressure injury of skin  Total Time spent with patient: 30 minutes  Past Psychiatric History:  Patient has a history of past depression repeated episodes throughout his life.  1 prior suicide attempt.  Was admitted to the hospital in mid May here with depression.  Was not showing significant improvement over time and was referred for ECT.  Patient received a few ECT treatments which generally appeared to be tolerated well. He did develop an arrhythmia which resolved.  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 06/2015 CT Head: abnl thickening of mid-dist R cervical ICA w/o narrowing - ? vasculitis and thrombosed/healing dissection; b. 01/2021 Carotid U/S: 1-39% bilat ICA stenoses.   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 Age 74 - struck in head w/ baseball --> s/p craniotomy.   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 Approx 2015 - eval in [place] - conservatively managed.    Past Surgical History:  Procedure Laterality Date   ANKLE ARTHROSCOPY Left    fracture   CHOLECYSTECTOMY  2007   [place]   left frontal craniotomy for epidural hematoma   HIP ARTHROPLASTY Right 04/07/2022   Procedure: ARTHROPLASTY BIPOLAR HIP (HEMIARTHROPLASTY);  Surgeon: Earnestine Leys, MD;  Location: [facility] ORS;  Service: Orthopedics;  Laterality: Right;   INSERTION OF MESH  01/02/2022   Procedure: INSERTION OF MESH;  Surgeon: Herbert Pun, MD;  Location: [facility] ORS;  Service: General;;   LIGAMENT REPAIR Right    thumb   RETINAL DETACHMENT SURGERY     TREATMENT FISTULA ANAL  2006   Family History:  Family History  Problem Relation Age of Onset   Osteoporosis Mother    Depression Mother    Prostate cancer Father    Hypertension Father    Diabetes Brother    Kidney disease Neg Hx     Social History:  Social History   Substance and Sexual Activity  Alcohol Use Not Currently     Social History   Substance and Sexual Activity  Drug Use No    Social History   Socioeconomic History   Marital status: Married    Spouse name: [name]   Number of children: 2   Years of education: Not on file  Highest education level: Master's degree (e.g., MA, MS, MEng, MEd, MSW, MBA)  Occupational History   Not on file  Tobacco Use   Smoking status: Never   Smokeless tobacco: Never  Vaping Use   Vaping Use: Never used  Substance and Sexual Activity   Alcohol use: Not Currently   Drug use: No   Sexual activity: Yes    Birth control/protection: None  Other Topics Concern   Not on file  Social History Narrative   Live at home with wife.    Social Determinants of Health   Financial Resource Strain: Not on file  Food Insecurity: Food Insecurity Present (07/09/2022)   Hunger Vital Sign    Worried About Running Out of Food in the Last Year: Sometimes true    Ran Out of Food in the Last Year:  Sometimes true  Transportation Needs: No Transportation Needs (07/09/2022)   PRAPARE - Hydrologist (Medical): No    Lack of Transportation (Non-Medical): No  Physical Activity: Not on file  Stress: Not on file  Social Connections: Not on file   Additional Social History:                         Sleep: Good  Appetite:  Fair  Current Medications: Current Facility-Administered Medications  Medication Dose Route Frequency Provider Last Rate Last Admin   acetaminophen (TYLENOL) tablet 650 mg  650 mg Oral Q6H PRN Patrecia Pour, NP   650 mg at 07/14/22 1317   alum & mag hydroxide-simeth (MAALOX/MYLANTA) 200-200-20 MG/5ML suspension 30 mL  30 mL Oral Q4H PRN Patrecia Pour, NP       ascorbic acid (VITAMIN C) tablet 500 mg  500 mg Oral BID Parks Ranger, DO   500 mg at 07/15/22 1610   aspirin EC tablet 81 mg  81 mg Oral Daily Patrecia Pour, NP   81 mg at 07/15/22 9604   buPROPion (WELLBUTRIN XL) 24 hr tablet 300 mg  300 mg Oral Daily Patrecia Pour, NP   300 mg at 07/15/22 5409   docusate sodium (COLACE) capsule 100 mg  100 mg Oral BID Patrecia Pour, NP   100 mg at 07/15/22 8119   insulin aspart (novoLOG) injection 0-6 Units  0-6 Units Subcutaneous TID WC Parks Ranger, DO   2 Units at 07/14/22 1646   liver oil-zinc oxide (DESITIN) 40 % ointment   Topical BID Parks Ranger, DO   1 Application at 14/78/29 5621   magnesium hydroxide (MILK OF MAGNESIA) suspension 30 mL  30 mL Oral Daily PRN Patrecia Pour, NP       methocarbamol (ROBAXIN) tablet 500 mg  500 mg Oral Q6H PRN Patrecia Pour, NP       metoprolol tartrate (LOPRESSOR) tablet 12.5 mg  12.5 mg Oral BID Parks Ranger, DO   12.5 mg at 07/15/22 3086   modafinil (PROVIGIL) tablet 100 mg  100 mg Oral QPC breakfast Parks Ranger, DO   100 mg at 07/15/22 5784   multivitamin with minerals tablet 1 tablet  1 tablet Oral Daily Parks Ranger,  DO   1 tablet at 07/15/22 0822   OLANZapine (ZYPREXA) tablet 10 mg  10 mg Oral QHS Parks Ranger, DO   10 mg at 07/14/22 2107   polyethylene glycol (MIRALAX / GLYCOLAX) packet 17 g  17 g Oral Daily PRN Patrecia Pour, NP  protein supplement (ENSURE MAX) liquid  11 oz Oral BID Parks Ranger, DO   8 oz at 07/14/22 2106   senna (SENOKOT) tablet 8.6 mg  1 tablet Oral BID Patrecia Pour, NP   8.6 mg at 07/15/22 0825   sertraline (ZOLOFT) tablet 50 mg  50 mg Oral QHS Patrecia Pour, NP   50 mg at 07/14/22 2109   tamsulosin (FLOMAX) capsule 0.4 mg  0.4 mg Oral QPC supper Patrecia Pour, NP   0.4 mg at 07/14/22 1646   traZODone (DESYREL) tablet 50 mg  50 mg Oral QHS PRN Patrecia Pour, NP   50 mg at 07/12/22 2127    Lab Results:  Results for orders placed or performed during the hospital encounter of 07/04/22 (from the past 48 hour(s))  Glucose, capillary     Status: None   Collection Time: 07/13/22 11:35 AM  Result Value Ref Range   Glucose-Capillary 93 70 - 99 mg/dL    Comment: Glucose reference range applies only to samples taken after fasting for at least 8 hours.  Glucose, capillary     Status: Abnormal   Collection Time: 07/13/22  4:14 PM  Result Value Ref Range   Glucose-Capillary 193 (H) 70 - 99 mg/dL    Comment: Glucose reference range applies only to samples taken after fasting for at least 8 hours.  Glucose, capillary     Status: Abnormal   Collection Time: 07/13/22  8:09 PM  Result Value Ref Range   Glucose-Capillary 100 (H) 70 - 99 mg/dL    Comment: Glucose reference range applies only to samples taken after fasting for at least 8 hours.  Glucose, capillary     Status: Abnormal   Collection Time: 07/14/22  7:50 AM  Result Value Ref Range   Glucose-Capillary 116 (H) 70 - 99 mg/dL    Comment: Glucose reference range applies only to samples taken after fasting for at least 8 hours.   Comment 1 Notify RN   Glucose, capillary     Status: Abnormal    Collection Time: 07/14/22 11:30 AM  Result Value Ref Range   Glucose-Capillary 144 (H) 70 - 99 mg/dL    Comment: Glucose reference range applies only to samples taken after fasting for at least 8 hours.   Comment 1 Notify RN   Glucose, capillary     Status: Abnormal   Collection Time: 07/14/22  4:15 PM  Result Value Ref Range   Glucose-Capillary 207 (H) 70 - 99 mg/dL    Comment: Glucose reference range applies only to samples taken after fasting for at least 8 hours.  Glucose, capillary     Status: None   Collection Time: 07/14/22  7:33 PM  Result Value Ref Range   Glucose-Capillary 73 70 - 99 mg/dL    Comment: Glucose reference range applies only to samples taken after fasting for at least 8 hours.  Glucose, capillary     Status: None   Collection Time: 07/15/22  7:32 AM  Result Value Ref Range   Glucose-Capillary 93 70 - 99 mg/dL    Comment: Glucose reference range applies only to samples taken after fasting for at least 8 hours.    Blood Alcohol level:  Lab Results  Component Value Date   [facility] <10 07/02/2022   ETH <10 33/82/5053    Metabolic Disorder Labs: Lab Results  Component Value Date   HGBA1C 5.6 03/19/2022   MPG 114.02 03/19/2022   No results found for: "PROLACTIN"  Lab Results  Component Value Date   CHOL 131 03/19/2022   TRIG 92 03/19/2022   HDL 33 (L) 03/19/2022   CHOLHDL 4.0 03/19/2022   VLDL 18 03/19/2022   LDLCALC 80 03/19/2022    Physical Findings: AIMS:  , ,  ,  ,    CIWA:    COWS:     Musculoskeletal: Strength & Muscle Tone: within normal limits Gait & Station: unable to stand Patient leans: N/A  Psychiatric Specialty Exam: Physical Exam Vitals and nursing note reviewed.  HENT:     Head: Normocephalic.     Nose: Nose normal.  Pulmonary:     Effort: Pulmonary effort is normal.  Musculoskeletal:        General: Normal range of motion.     Cervical back: Normal range of motion.  Skin:    General: Skin is warm and dry.  Neurological:      General: No focal deficit present.     Mental Status: He is alert.  Psychiatric:        Mood and Affect: Mood normal.        Behavior: Behavior normal.     Review of Systems  All other systems reviewed and are negative.   Blood pressure (!) 144/75, pulse 62, temperature 98.5 F (36.9 C), resp. rate 18, height '6\' 2"'$  (1.88 m), weight 70.8 kg, SpO2 98 %.Body mass index is 20.03 kg/m.  General Appearance: Neat and Well Groomed  Eye Contact:  Fair  Speech:  Slow  Volume:  Decreased  Mood:  Depressed  Affect:  Depressed and Flat  Thought Process:  Coherent  Orientation:  Full (Time, Place, and Person)  Thought Content:  WDL  Suicidal Thoughts:  No  Homicidal Thoughts:  No  Memory:  Immediate;   Fair  Judgement:  Fair  Insight:  Fair  Psychomotor Activity:  Decreased  Concentration:  Concentration: Good  Recall:  NA  Fund of Knowledge:  Fair  Language:  Good  Akathisia:  No  Handed:  Right  AIMS (if indicated):     Assets:  Social Support  ADL's:  Impaired  Cognition:  Impaired,  Mild  Sleep:  Number of Hours: 8       Blood pressure (!) 144/75, pulse 62, temperature 98.5 F (36.9 C), resp. rate 18, height '6\' 2"'$  (1.88 m), weight 70.8 kg, SpO2 98 %. Body mass index is 20.03 kg/m.   Treatment Plan Summary: Daily contact with patient to assess and evaluate symptoms and progress in treatment, Medication management, and Plan continue current medications. Major depressive disorder, recurrent, severe without psychosis: Wellbutrin 300 mg daily Zyprexa 10 mg at bedtime ECT started today  Stimulant: Provigil 100 mg at bedtime  Waylan Boga, NP 07/15/2022, 9:58 AM

## 2022-07-15 NOTE — Anesthesia Preprocedure Evaluation (Addendum)
Anesthesia Evaluation  Patient identified by MRN, date of birth, ID band Patient awake    Reviewed: Allergy & Precautions, NPO status , Patient's Chart, lab work & pertinent test results  Airway Mallampati: II  TM Distance: >3 FB     Dental  (+) Teeth Intact   Pulmonary sleep apnea and Continuous Positive Airway Pressure Ventilation ,           Cardiovascular hypertension, Pt. on medications  Rhythm:Regular     Neuro/Psych  Headaches, Depression    GI/Hepatic negative GI ROS, Neg liver ROS,   Endo/Other  diabetes, Type 2, Oral Hypoglycemic Agents  Renal/GU negative Renal ROS     Musculoskeletal negative musculoskeletal ROS (+)   Abdominal   Peds negative pediatric ROS (+)  Hematology   Anesthesia Other Findings   Reproductive/Obstetrics                             Anesthesia Physical Anesthesia Plan  ASA: 3  Anesthesia Plan: General   Post-op Pain Management:    Induction: Intravenous  PONV Risk Score and Plan:   Airway Management Planned: Natural Airway and Mask  Additional Equipment:   Intra-op Plan:   Post-operative Plan:   Informed Consent: I have reviewed the patients History and Physical, chart, labs and discussed the procedure including the risks, benefits and alternatives for the proposed anesthesia with the patient or authorized representative who has indicated his/her understanding and acceptance.       Plan Discussed with: CRNA and Surgeon  Anesthesia Plan Comments:         Anesthesia Quick Evaluation

## 2022-07-15 NOTE — Progress Notes (Signed)
OT Cancellation Note  Patient Details Name: John [name] MRN: [id number] DOB: Dec 07, 1940   Cancelled Treatment:    Reason Eval/Treat Not Completed: Patient at procedure or test/ unavailable. Pt off floor for ECT procedure. Will attempt therapy at a later time/date.  Josiah Lobo 07/15/2022, 3:47 PM

## 2022-07-15 NOTE — Anesthesia Procedure Notes (Signed)
Procedure Name: General with mask airway Date/Time: 07/15/2022 1:52 PM  Performed by: Tollie Eth, CRNAPre-anesthesia Checklist: Patient identified, Emergency Drugs available, Suction available and Patient being monitored Patient Re-evaluated:Patient Re-evaluated prior to induction Oxygen Delivery Method: Circle system utilized Preoxygenation: Pre-oxygenation with 100% oxygen Induction Type: IV induction Ventilation: Mask ventilation without difficulty and Mask ventilation throughout procedure Airway Equipment and Method: Bite block Placement Confirmation: positive ETCO2 Dental Injury: Teeth and Oropharynx as per pre-operative assessment

## 2022-07-15 NOTE — Group Note (Signed)
[facility] LCSW Group Therapy Note    Group Date: 07/15/2022 Start Time: 1300 End Time: 1400  Type of Therapy and Topic:  Group Therapy:  Overcoming Obstacles  Participation Level:  [facility] PARTICIPATION LEVEL: Did Not Attend  Mood:  Description of Group:   In this group patients will be encouraged to explore what they see as obstacles to their own wellness and recovery. They will be guided to discuss their thoughts, feelings, and behaviors related to these obstacles. The group will process together ways to cope with barriers, with attention given to specific choices patients can make. Each patient will be challenged to identify changes they are motivated to make in order to overcome their obstacles. This group will be process-oriented, with patients participating in exploration of their own experiences as well as giving and receiving support and challenge from other group members.  Therapeutic Goals: 1. Patient will identify personal and current obstacles as they relate to admission. 2. Patient will identify barriers that currently interfere with their wellness or overcoming obstacles.  3. Patient will identify feelings, thought process and behaviors related to these barriers. 4. Patient will identify two changes they are willing to make to overcome these obstacles:    Summary of Patient Progress   Patient at ECT at this time.   Therapeutic Modalities:   Cognitive Behavioral Therapy Solution Focused Therapy Motivational Interviewing Relapse Prevention Therapy   Rozann Lesches, LCSW

## 2022-07-15 NOTE — Procedures (Signed)
ECT SERVICES Physician's Interval Evaluation & Treatment Note  Patient Identification: John [name] MRN:  [id number] Date of Evaluation:  07/15/2022 TX #: 1 in this new series  MADRS:   MMSE:   P.E. Findings:  Withdrawn tired  Psychiatric Interval Note:  Severely depressed  Subjective:  Patient is a 81 y.o. male seen for evaluation for Electroconvulsive Therapy. Depressed with no active suicidal intent for functioning  Treatment Summary:   '[x]'$   Right Unilateral             '[]'$  Bilateral   % Energy : 0.3 ms 100%   Impedance: 1870 ohms  Seizure Energy Index: 5597 V squared  Postictal Suppression Index: No reading  Seizure Concordance Index: 94%  Medications  Pre Shock: Robinul 0.1 mg Toradol 30 mg labetalol 10 mg ketamine 100 mg succinylcholine 100 mg  Post Shock: Versed 2 mg  Seizure Duration: 28 seconds EMG 70 seconds EEG   Comments: Follow-up Wednesday  Lungs:  '[x]'$   Clear to auscultation               '[]'$  Other:   Heart:    '[x]'$   Regular rhythm             '[]'$  irregular rhythm    '[x]'$   Previous H&P reviewed, patient examined and there are NO CHANGES                 '[]'$   Previous H&P reviewed, patient examined and there are changes noted.   [name], MD 9/18/20232:53 PM

## 2022-07-15 NOTE — Progress Notes (Signed)
Patient incontinent of urine. Cleaned and changed patient before bringing back to geropsych.

## 2022-07-15 NOTE — Plan of Care (Signed)
Pt not progressing.

## 2022-07-15 NOTE — Progress Notes (Signed)
Patient presents with depressed mood, affect flat. John [name] remains very withdrawn, no interaction with peers and no initiation or volition to interact with staff. Patient continues to display thought blocking, as he stares when questions are asked at times before answering in soft tone, however is speaking louder and more clearly than prior shifts with this patient. He continues to require aggressive toileting and ambulation as he does not shift his own body and has developed sacral wounds . Peri care completed as well as barrier cream applied to cleansed skin this am and a new sacral foam dressing has been applied.  Patient denies any SI or HI and no signs of patient responding to internal stimuli. Plan for patient to have ECT treatment today and patient has complied with NPO, and am meds given with small amount of water to ensure NPO compliance. Patient is safe, will con't to monitor.

## 2022-07-16 DIAGNOSIS — F332 Major depressive disorder, recurrent severe without psychotic features: Secondary | ICD-10-CM | POA: Diagnosis not present

## 2022-07-16 LAB — GLUCOSE, CAPILLARY
Glucose-Capillary: 101 mg/dL — ABNORMAL HIGH (ref 70–99)
Glucose-Capillary: 97 mg/dL (ref 70–99)
Glucose-Capillary: 83 mg/dL (ref 70–99)
Glucose-Capillary: 103 mg/dL — ABNORMAL HIGH (ref 70–99)

## 2022-07-16 NOTE — Group Note (Signed)
[facility] LCSW Group Therapy Note   Group Date: 07/16/2022 Start Time: 1330 End Time: 1430  Type of Therapy/Topic:  Group Therapy:  Feelings about Diagnosis  Participation Level:  Active   Description of Group:    This group will allow patients to explore their thoughts and feelings about diagnoses they have received. Patients will be guided to explore their level of understanding and acceptance of these diagnoses. Facilitator will encourage patients to process their thoughts and feelings about the reactions of others to their diagnosis, and will guide patients in identifying ways to discuss their diagnosis with significant others in their lives. This group will be process-oriented, with patients participating in exploration of their own experiences as well as giving and receiving support and challenge from other group members.   Therapeutic Goals: 1. Patient will demonstrate understanding of diagnosis as evidence by identifying two or more symptoms of the disorder:  2. Patient will be able to express two feelings regarding the diagnosis 3. Patient will demonstrate ability to communicate their needs through discussion and/or role plays  Summary of Patient Progress: Patient was present in group. Patient appeared attentive, however, was often off topic or had loose connection to subject matter.  Patient appeared to understand the connections, however, they were not readily clear to this CSW.  Patient could have been disorganized due to the behaviors of others in the group.   Therapeutic Modalities:   Cognitive Behavioral Therapy Brief Therapy Feelings Identification    Rozann Lesches, LCSW

## 2022-07-16 NOTE — Progress Notes (Signed)
Patient is anxious and depressed. Denies SI/HI/AVH. VS are stable. Compliant to medications. Patient turned Q2H. Patient is Lethargic and his speech is slow. Denies pain. Patient ate snacks. Q15 checks were done. No other issues. Patient slept on his bed most of the shift with eyes closed

## 2022-07-16 NOTE — Progress Notes (Signed)
[facility] MD Progress Note  07/16/2022 10:46 AM John [name]  MRN:  [id number]  Subjective: Patient is a 81 y/o male with a history of depression. Today, patient was interviewed while sitting in break room for clients. He was sitting up in chair with banana and a drink in front of him.  Patient's affect is flat and sad, with markedly slow psychomotor movements. Patient is able to hold eye contact when asked questions and while this clinician waits for his response. His gaze is fixed during this period, but will look down after being unable to come up with an answer. Patient appears to be attempting to answer questions, but has difficulty articulating words. When he is able to verbalize, his speech is slowed and markedly quiet. When questioned about feeling depressed or anxious, his brow furrowed and he appeared to attempt to speak but was unsuccessful in formulating articulate words. When shown pictures of a happy, sad, and afraid-looking faces, he pointed to the afraid-looking face when asked which one shows his feelings. When offered a pen and paper, he was able to write legible letters but they did not constitute known words. He wrote: " I feel hgainstit." And "agigi" and "Angxety." By interpretation, it is possible he was attempting to say he feels anxiety. He also wrote the word "Fawn" next to the happy face.  The patient was unable to formulate words in response to questions about medication side effects or appetite, as well as suicidal and homicidal ideations. He did nod his head vigorously in response to the question about getting good sleep, appearing to indicate that he did have a good night's sleep.  Principal Problem: Severe recurrent major depression without psychotic features ([place]) Diagnosis: Principal Problem:   Severe recurrent major depression without psychotic features ([place]) Active Problems:   Major depressive disorder, recurrent severe without psychotic features ([place])   Pressure injury of  skin  Total Time spent with patient: 30 minutes  Past Psychiatric History:  Patient has a history of past depression repeated episodes throughout his life.  1 prior suicide attempt.  Was admitted to the hospital in mid May here with depression.  Was not showing significant improvement over time and was referred for ECT.  Patient received a few ECT treatments which generally appeared to be tolerated well. He did develop an arrhythmia which resolved.  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 06/2015 CT Head: abnl thickening of mid-dist R cervical ICA w/o narrowing - ? vasculitis and thrombosed/healing dissection; b. 01/2021 Carotid U/S: 1-39% bilat ICA stenoses.   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 Age 28 - struck in head w/ baseball --> s/p craniotomy.   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 Approx 2015 - eval in [place] - conservatively managed.    Past  Surgical History:  Procedure Laterality Date   ANKLE ARTHROSCOPY Left    fracture   CHOLECYSTECTOMY  2007   [place]   left frontal craniotomy for epidural hematoma   HIP ARTHROPLASTY Right 04/07/2022   Procedure: ARTHROPLASTY BIPOLAR HIP (HEMIARTHROPLASTY);  Surgeon: Earnestine Leys, MD;  Location: [facility] ORS;  Service: Orthopedics;  Laterality: Right;   INSERTION OF MESH  01/02/2022   Procedure: INSERTION OF MESH;  Surgeon: Herbert Pun, MD;  Location: [facility] ORS;  Service: General;;   LIGAMENT REPAIR Right    thumb    RETINAL DETACHMENT SURGERY     TREATMENT FISTULA ANAL  2006   Family History:  Family History  Problem Relation Age of Onset   Osteoporosis Mother    Depression Mother    Prostate cancer Father    Hypertension Father    Diabetes Brother    Kidney disease Neg Hx     Social History:  Social History   Substance and Sexual Activity  Alcohol Use Not Currently     Social History   Substance and Sexual Activity  Drug Use No    Social History   Socioeconomic History   Marital status: Married    Spouse name: [name]   Number of children: 2   Years of education: Not on file   Highest education level: Master's degree (e.g., MA, MS, MEng, MEd, MSW, MBA)  Occupational History   Not on file  Tobacco Use   Smoking status: Never   Smokeless tobacco: Never  Vaping Use   Vaping Use: Never used  Substance and Sexual Activity   Alcohol use: Not Currently   Drug use: No   Sexual activity: Yes    Birth control/protection: None  Other Topics Concern   Not on file  Social History Narrative   Live at home with wife.    Social Determinants of Health   Financial Resource Strain: Not on file  Food Insecurity: Food Insecurity Present (07/09/2022)   Hunger Vital Sign    Worried About Running Out of Food in the Last Year: Sometimes true    Ran Out of Food in the Last Year: Sometimes true  Transportation Needs: No Transportation Needs (07/09/2022)   PRAPARE - Hydrologist (Medical): No    Lack of Transportation (Non-Medical): No  Physical Activity: Not on file  Stress: Not on file  Social Connections: Not on file   Additional Social History:                         Sleep: Good  Appetite:  Fair  Current Medications: Current Facility-Administered Medications  Medication Dose Route Frequency Provider Last Rate Last Admin   acetaminophen (TYLENOL) tablet 650 mg  650 mg Oral Q6H PRN Patrecia Pour, NP   650 mg at 07/14/22 1317   alum & mag  hydroxide-simeth (MAALOX/MYLANTA) 200-200-20 MG/5ML suspension 30 mL  30 mL Oral Q4H PRN Patrecia Pour, NP       ascorbic acid (VITAMIN C) tablet 500 mg  500 mg Oral BID Parks Ranger, DO   500 mg at 07/16/22 0843   aspirin EC tablet 81 mg  81 mg Oral Daily Patrecia Pour, NP   81 mg at 07/16/22 0843   buPROPion (WELLBUTRIN XL) 24 hr tablet 300 mg  300 mg Oral Daily Patrecia Pour, NP   300 mg at 07/16/22 0842   docusate sodium (COLACE) capsule 100 mg  100 mg Oral BID Patrecia Pour, NP   100 mg at 07/16/22 0843   glycopyrrolate (ROBINUL) injection 0.1 mg  0.1 mg Intravenous Once Clapacs, John T, MD       insulin aspart (novoLOG) injection 0-6 Units  0-6 Units Subcutaneous TID WC Parks Ranger, DO   2 Units at 07/14/22 1646   ketorolac (TORADOL) 30 MG/ML injection 30 mg  30 mg Intravenous Once Clapacs, [name], MD       liver oil-zinc oxide (DESITIN) 40 % ointment   Topical BID Parks Ranger, DO   Given at 07/16/22 2979   magnesium hydroxide (MILK OF MAGNESIA) suspension 30 mL  30 mL Oral Daily PRN Patrecia Pour, NP       methocarbamol (ROBAXIN) tablet 500 mg  500 mg Oral Q6H PRN Patrecia Pour, NP       metoprolol tartrate (LOPRESSOR) tablet 12.5 mg  12.5 mg Oral BID Parks Ranger, DO   12.5 mg at 07/16/22 0843   modafinil (PROVIGIL) tablet 100 mg  100 mg Oral QPC breakfast Parks Ranger, DO   100 mg at 07/16/22 8921   multivitamin with minerals tablet 1 tablet  1 tablet Oral Daily Parks Ranger, DO   1 tablet at 07/16/22 0843   OLANZapine (ZYPREXA) tablet 10 mg  10 mg Oral QHS Parks Ranger, DO   10 mg at 07/15/22 2121   polyethylene glycol (MIRALAX / GLYCOLAX) packet 17 g  17 g Oral Daily PRN Patrecia Pour, NP       protein supplement (ENSURE MAX) liquid  11 oz Oral BID Parks Ranger, DO   11 oz at 07/15/22 2124   senna (SENOKOT) tablet 8.6 mg  1 tablet Oral BID Patrecia Pour, NP   8.6 mg at 07/16/22 0844    sertraline (ZOLOFT) tablet 50 mg  50 mg Oral QHS Patrecia Pour, NP   50 mg at 07/15/22 2123   tamsulosin (FLOMAX) capsule 0.4 mg  0.4 mg Oral QPC supper Patrecia Pour, NP   0.4 mg at 07/15/22 1637   traZODone (DESYREL) tablet 50 mg  50 mg Oral QHS PRN Patrecia Pour, NP   50 mg at 07/12/22 2127    Lab Results:  Results for orders placed or performed during the hospital encounter of 07/04/22 (from the past 48 hour(s))  Glucose, capillary     Status: Abnormal   Collection Time: 07/14/22 11:30 AM  Result Value Ref Range   Glucose-Capillary 144 (H) 70 - 99 mg/dL    Comment: Glucose reference range applies only to samples taken after fasting for at least 8 hours.   Comment 1 Notify RN   Glucose, capillary     Status: Abnormal   Collection Time: 07/14/22  4:15 PM  Result Value Ref Range   Glucose-Capillary 207 (H) 70 - 99 mg/dL    Comment: Glucose reference range applies only to samples taken after fasting for at least 8 hours.  Glucose, capillary     Status: None   Collection Time: 07/14/22  7:33 PM  Result Value Ref Range   Glucose-Capillary 73 70 - 99 mg/dL    Comment: Glucose reference range applies only to samples taken after fasting for at least 8 hours.  Glucose, capillary     Status: None   Collection Time: 07/15/22  7:32 AM  Result Value Ref Range   Glucose-Capillary 93 70 - 99 mg/dL    Comment: Glucose  reference range applies only to samples taken after fasting for at least 8 hours.  Glucose, capillary     Status: Abnormal   Collection Time: 07/15/22  2:10 PM  Result Value Ref Range   Glucose-Capillary 106 (H) 70 - 99 mg/dL    Comment: Glucose reference range applies only to samples taken after fasting for at least 8 hours.  Glucose, capillary     Status: Abnormal   Collection Time: 07/15/22  3:58 PM  Result Value Ref Range   Glucose-Capillary 114 (H) 70 - 99 mg/dL    Comment: Glucose reference range applies only to samples taken after fasting for at least 8 hours.   Glucose, capillary     Status: Abnormal   Collection Time: 07/15/22  8:33 PM  Result Value Ref Range   Glucose-Capillary 147 (H) 70 - 99 mg/dL    Comment: Glucose reference range applies only to samples taken after fasting for at least 8 hours.   Comment 1 Notify RN   Glucose, capillary     Status: Abnormal   Collection Time: 07/16/22  7:55 AM  Result Value Ref Range   Glucose-Capillary 101 (H) 70 - 99 mg/dL    Comment: Glucose reference range applies only to samples taken after fasting for at least 8 hours.    Blood Alcohol level:  Lab Results  Component Value Date   ETH <10 07/02/2022   ETH <10 35/45/6256    Metabolic Disorder Labs: Lab Results  Component Value Date   HGBA1C 5.6 03/19/2022   MPG 114.02 03/19/2022   No results found for: "PROLACTIN" Lab Results  Component Value Date   CHOL 131 03/19/2022   TRIG 92 03/19/2022   HDL 33 (L) 03/19/2022   CHOLHDL 4.0 03/19/2022   VLDL 18 03/19/2022   LDLCALC 80 03/19/2022    Physical Findings: AIMS:  , ,  ,  ,    CIWA:    COWS:     Musculoskeletal: Strength & Muscle Tone: within normal limits Gait & Station: unable to stand Patient leans: N/A  Psychiatric Specialty Exam: Physical Exam Vitals and nursing note reviewed.  HENT:     Head: Normocephalic.     Nose: Nose normal.  Pulmonary:     Effort: Pulmonary effort is normal.  Musculoskeletal:        General: Normal range of motion.     Cervical back: Normal range of motion.  Skin:    General: Skin is warm and dry.  Neurological:     General: No focal deficit present.     Mental Status: He is alert.  Psychiatric:        Mood and Affect: Mood normal.        Behavior: Behavior normal.     Review of Systems  Constitutional:        Patient complained of "pain in his bottom" while pointing to his right buttock. Patient unable to verbalize a score when asked.  All other systems reviewed and are negative.   Blood pressure 120/80, pulse 70, temperature 97.8  F (36.6 C), resp. rate 18, height '6\' 2"'$  (1.88 m), weight 70.8 kg, SpO2 100 %.Body mass index is 20.03 kg/m.  General Appearance: Neat and Well Groomed  Eye Contact:  Fair  Speech:  Slow  Volume:  Decreased  Mood:  Depressed  Affect:  Depressed and Flat  Thought Process:  Coherent  Orientation:  Full (Time, Place, and Person)  Thought Content:  WDL  Suicidal Thoughts:  No  Homicidal Thoughts:  No  Memory:  Immediate;   Fair  Judgement:  Fair  Insight:  Fair  Psychomotor Activity:  Decreased  Concentration:  Concentration: Good  Recall:  NA  Fund of Knowledge:  Fair  Language:  Good  Akathisia:  No  Handed:  Right  AIMS (if indicated):     Assets:  Social Support  ADL's:  Impaired  Cognition:  Impaired,  Mild  Sleep:  Number of Hours: 8.5       Blood pressure 120/80, pulse 70, temperature 97.8 F (36.6 C), resp. rate 18, height '6\' 2"'$  (1.88 m), weight 70.8 kg, SpO2 100 %. Body mass index is 20.03 kg/m.   Treatment Plan Summary: Daily contact with patient to assess and evaluate symptoms and progress in treatment, Medication management, and Plan continue current medications. Major depressive disorder, recurrent, severe without psychosis: Wellbutrin 300 mg daily Zyprexa 10 mg at bedtime  Stimulant: Provigil 100 mg at bedtime  Waylan Boga, NP 07/16/2022, 10:46 AM

## 2022-07-16 NOTE — Progress Notes (Signed)
Patient is alert and oriented times 2. Mood and affect appropriate. Patient rates pain as 0/10. He denies SI, HI, and AVH. Patient does not endorse feelings of anxiety and depression at this time. Patient states he slept well last night. Medicines administered whole by mouth without difficulty. Patient ate snack in day room; appetite good. Patient remains on unit with Q15 minute checks in place.

## 2022-07-16 NOTE — Progress Notes (Signed)
Occupational Therapy Treatment Patient Details Name: John [name] MRN: [id number] DOB: 05/29/1941 Today's Date: 07/16/2022   History of present illness Pt is a 81 y/o M who was voluntarily admitted to geriatric psychiatry for worsening depression. Pt was d/c from the unit ~2 months ago & was noncompliatn with medications. PMH: CA, depression, DM2, epidural hematoma s/p craniotomy (age 89), Horner syndrome, HTN, migraine, obesity, sleep apnea   OT comments  [name] presents with generalized weakness, limited endurance, and depressed mood. He ambulates smoothly with and w/o RW, no LOB, able to perform sit<>stand with good concentric and eccentric control. Discussed pt's stated intentions when he left geri-psych unit several months ago. 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 During today's session, pt states that he has not resumed any of these, or any other activities, that he enjoys and finds meaningful. He also states that he is unsure whether or not he has been taking his antidepressant medications as prescribed. He reports that he has felt nervous/embarrassed about going out in public/socializing with a RW or SPC, that he has done a little walking but mostly indoors or in his neighborhood alone. Discussed how during this hospitalization pt can develop more definitive plans for returning to meaningful occupations upon DC. Asked to describe his mood at present, with 1 being very, very low/depressed; 10 being feeling great, pt rated his mood at 1-2. Will continue to follow up with these pt  concerns during upcoming OT sessions.    Recommendations for follow up therapy are one component of a multi-disciplinary discharge planning process, led by the attending physician.  Recommendations may be updated based on patient status, additional functional criteria and insurance authorization.    Follow Up Recommendations  Home health OT    Assistance Recommended at Discharge Intermittent Supervision/Assistance  Patient can return home with the following  Help with stairs or ramp for entrance;Assistance with cooking/housework;A little help with bathing/dressing/bathroom   Equipment Recommendations  None recommended by OT    Recommendations for Other Services Other (comment) (referral for outpt mental health services, with appt scheduled prior to DC)    Precautions / Restrictions Precautions Precautions: Fall Restrictions Weight Bearing Restrictions: No       Mobility Bed Mobility               General bed mobility comments: in recliner    Transfers Overall transfer level: Needs assistance Equipment used: Rolling walker (2 wheels) Transfers: Sit to/from Stand Sit to Stand: Supervision           General transfer comment: SUPV for safety     Balance Overall balance assessment: Needs assistance Sitting-balance support: No upper extremity supported, Feet supported Sitting balance-Leahy Scale: Good     Standing balance support: Bilateral upper extremity supported, During functional activity, No upper extremity supported, Reliant on assistive device for balance Standing balance-Leahy Scale: Fair                             ADL either performed or assessed with clinical judgement   ADL  General ADL Comments: amb approx 100' with and w/o RW with supervision, no LOB, good safety awareness    Extremity/Trunk Assessment Upper Extremity Assessment Upper Extremity Assessment: Overall WFL for  tasks assessed   Lower Extremity Assessment Lower Extremity Assessment: Overall WFL for tasks assessed        Vision       Perception     Praxis      Cognition Arousal/Alertness: Awake/alert Behavior During Therapy: Flat affect, WFL for tasks assessed/performed Overall Cognitive Status: Within Functional Limits for tasks assessed                                          Exercises Other Exercises Other Exercises: Educ re: meaningful activities, medication mgmt, ongoing health/medical care, support services    Shoulder Instructions       General Comments      Pertinent Vitals/ Pain       Pain Assessment Pain Assessment: No/denies pain  Home Living                                          Prior Functioning/Environment              Frequency  Min 2X/week        Progress Toward Goals  OT Goals(current goals can now be found in the care plan section)  Progress towards OT goals: Progressing toward goals  Acute Rehab OT Goals OT Goal Formulation: With patient Time For Goal Achievement: 07/19/22 Potential to Achieve Goals: Good  Plan Discharge plan remains appropriate;Frequency remains appropriate    Co-evaluation                 AM-PAC OT "6 Clicks" Daily Activity     Outcome Measure   Help from another person eating meals?: None Help from another person taking care of personal grooming?: A Little Help from another person toileting, which includes using toliet, bedpan, or urinal?: A Little Help from another person bathing (including washing, rinsing, drying)?: A Little Help from another person to put on and taking off regular upper body clothing?: A Little Help from another person to put on and taking off regular lower body clothing?: A Little 6 Click Score: 19    End of Session Equipment Utilized During Treatment: Rolling walker (2 wheels)  OT Visit Diagnosis: Other abnormalities of gait and mobility  (R26.89);Muscle weakness (generalized) (M62.81)   Activity Tolerance Patient tolerated treatment well   Patient Left in chair;with nursing/sitter in room (in common room)   Nurse Communication          Time: 8101-7510 OT Time Calculation (min): 26 min  Charges: OT General Charges $OT Visit: 1 Visit OT Treatments $Self Care/Home Management : 23-37 mins  Josiah Lobo, PhD, MS, OTR/L 07/16/22, 4:56 PM

## 2022-07-17 ENCOUNTER — Other Ambulatory Visit: Payer: Self-pay | Admitting: Psychiatry

## 2022-07-17 DIAGNOSIS — F332 Major depressive disorder, recurrent severe without psychotic features: Secondary | ICD-10-CM | POA: Diagnosis not present

## 2022-07-17 LAB — GLUCOSE, CAPILLARY
Glucose-Capillary: 101 mg/dL — ABNORMAL HIGH (ref 70–99)
Glucose-Capillary: 184 mg/dL — ABNORMAL HIGH (ref 70–99)
Glucose-Capillary: 89 mg/dL (ref 70–99)
Glucose-Capillary: 151 mg/dL — ABNORMAL HIGH (ref 70–99)

## 2022-07-17 MED ORDER — KETAMINE HCL 50 MG/5ML IJ SOSY
PREFILLED_SYRINGE | INTRAMUSCULAR | Status: AC
  Filled 2022-07-17: qty 5

## 2022-07-17 MED ORDER — SUCCINYLCHOLINE CHLORIDE 200 MG/10ML IV SOSY
PREFILLED_SYRINGE | INTRAVENOUS | Status: AC
  Filled 2022-07-17: qty 10

## 2022-07-17 MED ORDER — MIDAZOLAM HCL 2 MG/2ML IJ SOLN
INTRAMUSCULAR | Status: AC
  Filled 2022-07-17: qty 2

## 2022-07-17 MED ORDER — METHOHEXITAL SODIUM 0.5 G IJ SOLR
INTRAMUSCULAR | Status: AC
  Filled 2022-07-17: qty 500

## 2022-07-17 NOTE — Plan of Care (Signed)
  Problem: Education: Goal: Knowledge of General Education information will improve Description: Including pain rating scale, medication(s)/side effects and non-pharmacologic comfort measures Outcome: Progressing   Problem: Health Behavior/Discharge Planning: Goal: Ability to manage health-related needs will improve Outcome: Not Progressing   Problem: Clinical Measurements: Goal: Ability to maintain clinical measurements within normal limits will improve Outcome: Progressing Goal: Will remain free from infection Outcome: Progressing Goal: Diagnostic test results will improve Outcome: Progressing Goal: Respiratory complications will improve Outcome: Progressing Goal: Cardiovascular complication will be avoided Outcome: Progressing   Problem: Activity: Goal: Risk for activity intolerance will decrease Outcome: Progressing   Problem: Nutrition: Goal: Adequate nutrition will be maintained Outcome: Progressing   Problem: Coping: Goal: Level of anxiety will decrease Outcome: Progressing   Problem: Elimination: Goal: Will not experience complications related to bowel motility Outcome: Progressing Goal: Will not experience complications related to urinary retention Outcome: Progressing   Problem: Pain Managment: Goal: General experience of comfort will improve Outcome: Progressing   Problem: Safety: Goal: Ability to remain free from injury will improve Outcome: Progressing   Problem: Skin Integrity: Goal: Risk for impaired skin integrity will decrease Outcome: Progressing   Problem: Education: Goal: Knowledge of [place] [facility] Education information/materials will improve Outcome: Progressing Goal: Emotional status will improve Outcome: Progressing Goal: Mental status will improve Outcome: Progressing Goal: Verbalization of understanding the information provided will improve Outcome: Progressing

## 2022-07-17 NOTE — Progress Notes (Signed)
Patient is alert and oriented times 2. Mood and affect sad/sullen. Patient rates pain as 0/10. He denies SI, HI, and AVH. Patient does not endorse feelings of anxiety and depression at this time. Patient states he slept well last night. Medicines administered whole by mouth without difficulty. Patient in day room watching TV calm and cooperative. Patient remains on unit with Q15 minute checks in place.

## 2022-07-17 NOTE — Progress Notes (Signed)
Physical Therapy Treatment Patient Details Name: John [name] MRN: [id number] DOB: 03/18/1941 Today's Date: 07/17/2022   History of Present Illness 81 y/o M who was voluntarily admitted to geriatric psychiatry for worsening depression. Pt was d/c from the unit ~2 months ago & was noncompliatn with medications. PMH: CA, depression, DM2, epidural hematoma s/p craniotomy (age 76), Horner syndrome, HTN, migraine, obesity, sleep apnea    PT Comments    Pt willing to participate with PT, relatively flat affect the entire time.  He did well with RW assisted ambulation, also did some HHA and no UE use bouts of ambulation with some decrease in speed/steadiness but safe and effective with each iteration.  His vitals were stable and he did not have any overt LOBs, though increased UE reliance with increased challenges.  Recommendations for follow up therapy are one component of a multi-disciplinary discharge planning process, led by the attending physician.  Recommendations may be updated based on patient status, additional functional criteria and insurance authorization.  Follow Up Recommendations  Home health PT     Assistance Recommended at Discharge Frequent or constant Supervision/Assistance  Patient can return home with the following A little help with walking and/or transfers;A little help with bathing/dressing/bathroom;Assistance with cooking/housework;Direct supervision/assist for financial management;Assist for transportation;Help with stairs or ramp for entrance;Direct supervision/assist for medications management   Equipment Recommendations  Rolling walker (2 wheels)    Recommendations for Other Services       Precautions / Restrictions Precautions Precautions: Fall Restrictions Weight Bearing Restrictions: No     Mobility  Bed Mobility               General bed mobility comments: NT    Transfers Overall transfer level: Needs assistance Equipment used: None, Rolling  walker (2 wheels) Transfers: Sit to/from Stand Sit to Stand: Supervision           General transfer comment: Pt was reliant on UEs to rise, but able to transition <> standing w/o direct assist    Ambulation/Gait Ambulation/Gait assistance: Min guard, Supervision Gait Distance (Feet): 225 Feet Assistive device: 1 person hand held assist, None, Rolling walker (2 wheels)         General Gait Details: Adjusted pt's walker to facilitate appropriate posture/mechanics.  He was able to maintain consistent cadence with the walker, and w/o, though a little slower was able to ambulate safely with single HHA (~25 ft) and then no UE support (~21f)   Stairs             Wheelchair Mobility    Modified Rankin (Stroke Patients Only)       Balance Overall balance assessment: Needs assistance Sitting-balance support: No upper extremity supported, Feet supported Sitting balance-Leahy Scale: Good     Standing balance support: During functional activity, Single extremity supported, Bilateral upper extremity supported, No upper extremity supported Standing balance-Leahy Scale: Fair                              Cognition Arousal/Alertness: Awake/alert Behavior During Therapy: WFL for tasks assessed/performed Overall Cognitive Status: Within Functional Limits for tasks assessed                                          Exercises Other Exercises Other Exercises: Balance activities with faded HHA: heel raises, heel-toe ambulation, retro ambulation, marching in  place and eyes open/closed static standing with min/mod perturbations    General Comments General comments (skin integrity, edema, etc.): 5xSTS = 26 sec      Pertinent Vitals/Pain Pain Assessment Pain Assessment: No/denies pain    Home Living                          Prior Function            PT Goals (current goals can now be found in the care plan section) Progress towards  PT goals: Progressing toward goals    Frequency    Min 2X/week      PT Plan Current plan remains appropriate    Co-evaluation              AM-PAC PT "6 Clicks" Mobility   Outcome Measure  Help needed turning from your back to your side while in a flat bed without using bedrails?: None Help needed moving from lying on your back to sitting on the side of a flat bed without using bedrails?: A Little Help needed moving to and from a bed to a chair (including a wheelchair)?: A Little Help needed standing up from a chair using your arms (e.g., wheelchair or bedside chair)?: A Little Help needed to walk in hospital room?: A Little Help needed climbing 3-5 steps with a railing? : A Little 6 Click Score: 19    End of Session Equipment Utilized During Treatment: Gait belt Activity Tolerance: Patient tolerated treatment well Patient left: in chair;Other (comment) Nurse Communication: Mobility status PT Visit Diagnosis: Unsteadiness on feet (R26.81);Muscle weakness (generalized) (M62.81)     Time: 1453-1510 PT Time Calculation (min) (ACUTE ONLY): 17 min  Charges:  $Therapeutic Activity: 8-22 mins                     Kreg Shropshire, DPT 07/17/2022, 4:04 PM

## 2022-07-17 NOTE — Plan of Care (Signed)
  Patient is AOX3. Depressed and anxious. Cooperative. More communicative than yesterday. Speech is clear. Patient is able to communicate needs with no prompting. VS are stable. Colace held since pt had multiple BMs. VS are stale. Denies SI/HI/AVH. Compliant to medications and appetite is good. Patient went bed  after ate snacks and took his medications. Pt slept on his bed with eyes closed most on the shift. Patient is on one to one for a fall risk. Q15 checks were done during the shift.

## 2022-07-17 NOTE — Progress Notes (Signed)
[facility] MD Progress Note  07/17/2022 9:48 AM Chip Canepa  MRN:  [id number]  Subjective: Patient is a 81 y/o male with a history of depression. When asked how he is doing he states "in between" and is unable to state more about that. He rates his  depression as 3/10 and anxiety as 2-3/10. He denies SI, HI. When asked about hallucinations he reports there have been a couple occurences recently of things "looking like they are moving." He is taking his medication, states it is helpful, and denies any side effects. He denies pain other than mild pain on his bottom. He reports a good appetite but states he has not eaten today due to therapy this afternoon.  His speech is slowed and at a low volume. However, it appears to be faster and louder than yesterday.   Principal Problem: Severe recurrent major depression without psychotic features ([place]) Diagnosis: Principal Problem:   Severe recurrent major depression without psychotic features ([place]) Active Problems:   Major depressive disorder, recurrent severe without psychotic features ([place])   Pressure injury of skin  Total Time spent with patient: 30 minutes  Past Psychiatric History:  Patient has a history of past depression repeated episodes throughout his life.  1 prior suicide attempt.  Was admitted to the hospital in mid May here with depression.  Was not showing significant improvement over time and was referred for ECT.  Patient received a few ECT treatments which generally appeared to be tolerated well. He did develop an arrhythmia which resolved.  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 06/2015 CT Head: abnl thickening of mid-dist R cervical ICA w/o narrowing - ? vasculitis and thrombosed/healing dissection; b. 01/2021 Carotid U/S: 1-39% bilat ICA stenoses.   Eczema    Elevated PSA     Epidural hematoma (HCC)    a. Age 17 - struck in head w/ baseball --> s/p craniotomy.   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 Approx 2015 - eval in [place] - conservatively managed.    Past Surgical History:  Procedure Laterality Date   ANKLE ARTHROSCOPY Left    fracture   CHOLECYSTECTOMY  2007   [place]   left frontal craniotomy for epidural hematoma   HIP ARTHROPLASTY Right 04/07/2022   Procedure: ARTHROPLASTY BIPOLAR HIP (HEMIARTHROPLASTY);  Surgeon: Earnestine Leys, MD;  Location: [facility] ORS;  Service: Orthopedics;  Laterality: Right;   INSERTION OF MESH  01/02/2022   Procedure: INSERTION OF MESH;  Surgeon: Herbert Pun, MD;  Location: [facility] ORS;  Service: General;;   LIGAMENT REPAIR Right    thumb   RETINAL DETACHMENT SURGERY     TREATMENT FISTULA ANAL  2006   Family History:  Family History  Problem Relation Age of Onset   Osteoporosis Mother    Depression Mother    Prostate cancer Father  Hypertension Father    Diabetes Brother    Kidney disease Neg Hx     Social History:  Social History   Substance and Sexual Activity  Alcohol Use Not Currently     Social History   Substance and Sexual Activity  Drug Use No    Social History   Socioeconomic History   Marital status: Married    Spouse name: [name]   Number of children: 2   Years of education: Not on file   Highest education level: Master's degree (e.g., MA, MS, MEng, MEd, MSW, MBA)  Occupational History   Not on file  Tobacco Use   Smoking status: Never   Smokeless tobacco: Never  Vaping Use   Vaping Use: Never used  Substance and Sexual Activity   Alcohol use:  Not Currently   Drug use: No   Sexual activity: Yes    Birth control/protection: None  Other Topics Concern   Not on file  Social History Narrative   Live at home with wife.    Social Determinants of Health   Financial Resource Strain: Not on file  Food Insecurity: Food Insecurity Present (07/09/2022)   Hunger Vital Sign    Worried About Running Out of Food in the Last Year: Sometimes true    Ran Out of Food in the Last Year: Sometimes true  Transportation Needs: No Transportation Needs (07/09/2022)   PRAPARE - Hydrologist (Medical): No    Lack of Transportation (Non-Medical): No  Physical Activity: Not on file  Stress: Not on file  Social Connections: Not on file   Additional Social History:        Sleep: Good  Appetite:  Fair  Current Medications: Current Facility-Administered Medications  Medication Dose Route Frequency Provider Last Rate Last Admin   acetaminophen (TYLENOL) tablet 650 mg  650 mg Oral Q6H PRN Patrecia Pour, NP   650 mg at 07/14/22 1317   alum & mag hydroxide-simeth (MAALOX/MYLANTA) 200-200-20 MG/5ML suspension 30 mL  30 mL Oral Q4H PRN Patrecia Pour, NP       ascorbic acid (VITAMIN C) tablet 500 mg  500 mg Oral BID Parks Ranger, DO   500 mg at 07/17/22 1740   aspirin EC tablet 81 mg  81 mg Oral Daily Patrecia Pour, NP   81 mg at 07/17/22 8144   buPROPion (WELLBUTRIN XL) 24 hr tablet 300 mg  300 mg Oral Daily Patrecia Pour, NP   300 mg at 07/17/22 8185   docusate sodium (COLACE) capsule 100 mg  100 mg Oral BID Patrecia Pour, NP   100 mg at 07/17/22 6314   glycopyrrolate (ROBINUL) injection 0.1 mg  0.1 mg Intravenous Once Clapacs, John T, MD       insulin aspart (novoLOG) injection 0-6 Units  0-6 Units Subcutaneous TID WC Parks Ranger, DO   2 Units at 07/14/22 1646   ketorolac (TORADOL) 30 MG/ML injection 30 mg  30 mg Intravenous Once Clapacs, [name], MD       liver oil-zinc oxide (DESITIN) 40 %  ointment   Topical BID Parks Ranger, DO   Given at 07/17/22 0933   magnesium hydroxide (MILK OF MAGNESIA) suspension 30 mL  30 mL Oral Daily PRN Patrecia Pour, NP       methocarbamol (ROBAXIN) tablet 500 mg  500 mg Oral Q6H PRN Patrecia Pour, NP       metoprolol tartrate (LOPRESSOR) tablet  12.5 mg  12.5 mg Oral BID Parks Ranger, DO   12.5 mg at 07/17/22 0932   modafinil (PROVIGIL) tablet 100 mg  100 mg Oral QPC breakfast Parks Ranger, DO   100 mg at 07/17/22 3557   multivitamin with minerals tablet 1 tablet  1 tablet Oral Daily Parks Ranger, DO   1 tablet at 07/17/22 3220   OLANZapine (ZYPREXA) tablet 10 mg  10 mg Oral QHS Parks Ranger, DO   10 mg at 07/16/22 2108   polyethylene glycol (MIRALAX / GLYCOLAX) packet 17 g  17 g Oral Daily PRN Patrecia Pour, NP       protein supplement (ENSURE MAX) liquid  11 oz Oral BID Parks Ranger, DO   11 oz at 07/16/22 2050   senna (SENOKOT) tablet 8.6 mg  1 tablet Oral BID Patrecia Pour, NP   8.6 mg at 07/17/22 2542   sertraline (ZOLOFT) tablet 50 mg  50 mg Oral QHS Patrecia Pour, NP   50 mg at 07/16/22 2109   tamsulosin (FLOMAX) capsule 0.4 mg  0.4 mg Oral QPC supper Patrecia Pour, NP   0.4 mg at 07/16/22 1638   traZODone (DESYREL) tablet 50 mg  50 mg Oral QHS PRN Patrecia Pour, NP   50 mg at 07/12/22 2127    Lab Results:  Results for orders placed or performed during the hospital encounter of 07/04/22 (from the past 48 hour(s))  Glucose, capillary     Status: Abnormal   Collection Time: 07/15/22  2:10 PM  Result Value Ref Range   Glucose-Capillary 106 (H) 70 - 99 mg/dL    Comment: Glucose reference range applies only to samples taken after fasting for at least 8 hours.  Glucose, capillary     Status: Abnormal   Collection Time: 07/15/22  3:58 PM  Result Value Ref Range   Glucose-Capillary 114 (H) 70 - 99 mg/dL    Comment: Glucose reference range applies only to samples taken  after fasting for at least 8 hours.  Glucose, capillary     Status: Abnormal   Collection Time: 07/15/22  8:33 PM  Result Value Ref Range   Glucose-Capillary 147 (H) 70 - 99 mg/dL    Comment: Glucose reference range applies only to samples taken after fasting for at least 8 hours.   Comment 1 Notify RN   Glucose, capillary     Status: Abnormal   Collection Time: 07/16/22  7:55 AM  Result Value Ref Range   Glucose-Capillary 101 (H) 70 - 99 mg/dL    Comment: Glucose reference range applies only to samples taken after fasting for at least 8 hours.  Glucose, capillary     Status: None   Collection Time: 07/16/22 11:32 AM  Result Value Ref Range   Glucose-Capillary 97 70 - 99 mg/dL    Comment: Glucose reference range applies only to samples taken after fasting for at least 8 hours.  Glucose, capillary     Status: None   Collection Time: 07/16/22  4:25 PM  Result Value Ref Range   Glucose-Capillary 83 70 - 99 mg/dL    Comment: Glucose reference range applies only to samples taken after fasting for at least 8 hours.  Glucose, capillary     Status: Abnormal   Collection Time: 07/16/22  8:37 PM  Result Value Ref Range   Glucose-Capillary 103 (H) 70 - 99 mg/dL    Comment: Glucose reference range applies only to samples  taken after fasting for at least 8 hours.  Glucose, capillary     Status: Abnormal   Collection Time: 07/17/22  8:43 AM  Result Value Ref Range   Glucose-Capillary 101 (H) 70 - 99 mg/dL    Comment: Glucose reference range applies only to samples taken after fasting for at least 8 hours.    Blood Alcohol level:  Lab Results  Component Value Date   ETH <10 07/02/2022   ETH <10 37/90/2409    Metabolic Disorder Labs: Lab Results  Component Value Date   HGBA1C 5.6 03/19/2022   MPG 114.02 03/19/2022   No results found for: "PROLACTIN" Lab Results  Component Value Date   CHOL 131 03/19/2022   TRIG 92 03/19/2022   HDL 33 (L) 03/19/2022   CHOLHDL 4.0 03/19/2022    VLDL 18 03/19/2022   LDLCALC 80 03/19/2022    Physical Findings: AIMS:  , ,  ,  ,    CIWA:    COWS:     Musculoskeletal: Strength & Muscle Tone: within normal limits Gait & Station: unable to stand. Walking with walker  Patient leans: N/A  Psychiatric Specialty Exam: Physical Exam Vitals and nursing note reviewed.  HENT:     Head: Normocephalic.     Nose: Nose normal.  Pulmonary:     Effort: Pulmonary effort is normal.  Musculoskeletal:        General: Normal range of motion.     Cervical back: Normal range of motion.  Skin:    General: Skin is warm and dry.  Neurological:     General: No focal deficit present.     Mental Status: He is alert.  Psychiatric:        Mood and Affect: Mood normal.        Behavior: Behavior normal.     Review of Systems  Constitutional:        Patient complained of "pain in his bottom" while pointing to his right buttock. Patient unable to verbalize a score when asked.  All other systems reviewed and are negative.   Blood pressure (!) 142/91, pulse 68, temperature 98.4 F (36.9 C), temperature source Oral, resp. rate 16, height '6\' 2"'$  (1.88 m), weight 70.8 kg, SpO2 100 %.Body mass index is 20.03 kg/m.  General Appearance: Neat and Well Groomed  Eye Contact:  Fair  Speech:  Slow  Volume:  Decreased  Mood:  Depressed  Affect:  Depressed and Flat  Thought Process:  Coherent  Orientation:  Full (Time, Place, and Person)  Thought Content:  WDL  Suicidal Thoughts:  No  Homicidal Thoughts:  No  Memory:  Immediate;   Fair  Judgement:  Fair  Insight:  Fair  Psychomotor Activity:  Decreased  Concentration:  Concentration: Good  Recall:  NA  Fund of Knowledge:  Fair  Language:  Good  Akathisia:  No  Handed:  Right  AIMS (if indicated):     Assets:  Social Support  ADL's:  Impaired  Cognition:  Impaired,  Mild  Sleep:  Number of Hours: 9       Blood pressure (!) 142/91, pulse 68, temperature 98.4 F (36.9 C), temperature source  Oral, resp. rate 16, height '6\' 2"'$  (1.88 m), weight 70.8 kg, SpO2 100 %. Body mass index is 20.03 kg/m.   Treatment Plan Summary: Daily contact with patient to assess and evaluate symptoms and progress in treatment, Medication management, and Plan continue current medications. Major depressive disorder, recurrent, severe without psychosis: Wellbutrin 300 mg  daily Zyprexa 10 mg at bedtime  Stimulant: Provigil 100 mg at bedtime  Waylan Boga, NP 07/17/2022, 9:48 AM

## 2022-07-17 NOTE — Group Note (Signed)
[facility] LCSW Group Therapy Note   Group Date: 07/17/2022 Start Time: 1330 End Time: 1430   Type of Therapy/Topic:  Group Therapy:  Emotion Regulation  Participation Level:  Minimal   Mood:  Description of Group:    The purpose of this group is to assist patients in learning to regulate negative emotions and experience positive emotions. Patients will be guided to discuss ways in which they have been vulnerable to their negative emotions. These vulnerabilities will be juxtaposed with experiences of positive emotions or situations, and patients challenged to use positive emotions to combat negative ones. Special emphasis will be placed on coping with negative emotions in conflict situations, and patients will process healthy conflict resolution skills.  Therapeutic Goals: Patient will identify two positive emotions or experiences to reflect on in order to balance out negative emotions:  Patient will label two or more emotions that they find the most difficult to experience:  Patient will be able to demonstrate positive conflict resolution skills through discussion or role plays:   Summary of Patient Progress: Patient was present  in group and attentive.  Patient shared little however, appeared to be following and processing the group discussion.   Therapeutic Modalities:   Cognitive Behavioral Therapy Feelings Identification Dialectical Behavioral Therapy   Rozann Lesches, LCSW

## 2022-07-17 NOTE — Plan of Care (Signed)
Patient is AOX3. Depressed and anxious. Cooperative. More communicative than yesterday. Speech is clear. Patient is able to communicate needs with no prompting. VS are stable. Colace held since pt had multiple BMs. VS are stale. Denies SI/HI/AVH. Compliant to medications and appetite is good. Patient went bed  after ate snacks and took his medications. Pt slept on his bed with eyes closed most on the shift. Patient is on one to one for a fall risk. Q15 checks were done during the shift.

## 2022-07-17 NOTE — Progress Notes (Signed)
Patient using walker was able to walk from day to bedroom and back to dayroom. Activity well tolerated.

## 2022-07-17 NOTE — Anesthesia Preprocedure Evaluation (Addendum)
Anesthesia Evaluation  Patient identified by MRN, date of birth, ID band Patient awake    Reviewed: Allergy & Precautions, H&P , NPO status , Patient's Chart, lab work & pertinent test results, reviewed documented beta blocker date and time   History of Anesthesia Complications (+) PONV and history of anesthetic complications  Airway Mallampati: II  TM Distance: <3 FB Neck ROM: full    Dental  (+) Poor Dentition, Chipped   Pulmonary sleep apnea and Continuous Positive Airway Pressure Ventilation ,    Pulmonary exam normal        Cardiovascular Exercise Tolerance: Poor hypertension, On Medications negative cardio ROS Normal cardiovascular exam     Neuro/Psych  Headaches, PSYCHIATRIC DISORDERS Depression    GI/Hepatic negative GI ROS, Neg liver ROS,   Endo/Other  negative endocrine ROSdiabetes  Renal/GU negative Renal ROS  negative genitourinary   Musculoskeletal   Abdominal   Peds  Hematology negative hematology ROS (+)   Anesthesia Other Findings Past Medical History: No date: Cancer ([place]) No date: Depression No date: Depression No date: Diabetes mellitus without complication (HCC)     Comment:  type 2 No date: 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 06/2015 CT Head: abnl thickening of mid-dist R               cervical ICA w/o narrowing - ? vasculitis and               thrombosed/healing dissection; b. 01/2021 Carotid U/S:               1-39% bilat ICA stenoses. No date: Eczema No date: Elevated PSA No date: Epidural hematoma (HCC)     Comment:  a. Age 42 - struck in head w/ baseball --> s/p               craniotomy. No date: Epidural hematoma (HCC) No date: 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, congenital bilateral: Hearing loss No date: Hemorrhoids 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 No date: Hypertension No date: Migraine No date: Obesity No date: PONV (postoperative nausea and  vomiting) 2020: Prostate cancer ([place]) No date: Rosacea No date: SDH (subdural hematoma) (HCC) No date: Sleep apnea No date: Spontaneous Subdural hematoma (HCC)     Comment:  a. Approx 2015 - eval in [place] - conservatively managed. Past Surgical History: No date: ANKLE ARTHROSCOPY; Left     Comment:  fracture 2007: CHOLECYSTECTOMY 1958: CRANIOTOMY     Comment:  left frontal craniotomy for epidural hematoma 04/07/2022: HIP ARTHROPLASTY; Right     Comment:  Procedure: ARTHROPLASTY BIPOLAR HIP (HEMIARTHROPLASTY);               Surgeon: Earnestine Leys, MD;  Location: [facility] ORS;                Service: Orthopedics;  Laterality: Right; 01/02/2022: INSERTION OF MESH     Comment:  Procedure: INSERTION OF MESH;  Surgeon: Herbert Pun, MD;  Location: [facility] ORS;  Service: General;; No date: LIGAMENT REPAIR; Right     Comment:  thumb No date: RETINAL DETACHMENT SURGERY 2006: TREATMENT FISTULA ANAL BMI    Body Mass Index: 24.84 kg/m     Reproductive/Obstetrics negative OB ROS                             Anesthesia Physical  Anesthesia Plan  ASA: 3  Anesthesia Plan: General   Post-op Pain Management:    Induction: Intravenous  PONV Risk Score and Plan: TIVA  Airway Management Planned: Mask  Additional Equipment:   Intra-op Plan:   Post-operative Plan:   Informed Consent: I have reviewed the patients History and Physical, chart, labs and discussed the procedure including the risks, benefits and alternatives for the proposed anesthesia with the patient or authorized representative who has indicated his/her understanding and acceptance.     Dental Advisory Given  Plan Discussed with: CRNA  Anesthesia Plan Comments: (Patient consented for risks of anesthesia including but not limited to:  - adverse reactions to medications - risk of airway placement if required - damage to eyes, teeth, lips or other oral mucosa - nerve damage due to  positioning  - sore throat or hoarseness - Damage to heart, brain, nerves, lungs, other parts of body or loss of life  Patient voiced understanding.)        Anesthesia Quick Evaluation

## 2022-07-18 ENCOUNTER — Other Ambulatory Visit: Payer: Self-pay | Admitting: Psychiatry

## 2022-07-18 DIAGNOSIS — F332 Major depressive disorder, recurrent severe without psychotic features: Secondary | ICD-10-CM | POA: Diagnosis not present

## 2022-07-18 LAB — GLUCOSE, CAPILLARY
Glucose-Capillary: 95 mg/dL (ref 70–99)
Glucose-Capillary: 84 mg/dL (ref 70–99)
Glucose-Capillary: 102 mg/dL — ABNORMAL HIGH (ref 70–99)
Glucose-Capillary: 95 mg/dL (ref 70–99)

## 2022-07-18 NOTE — Plan of Care (Signed)
Pt endorses experiencing anxiety and depression at this time. Pt denies SI/HI/AVH at this time. Pt reports experiencing 3/10 dull aching pain on buttocks where stage 1 pressure wounds are located, prn medication given. Pt was up and down throughout the night urinating and getting little sleep. Support and encouragement provided. Pt monitored q15 minute safety checks per unit policy. Pt is calm and cooperative. Pt is medication compliant. Plan of care ongoing.  Problem: Pain Managment: Goal: General experience of comfort will improve Outcome: Progressing   Problem: Coping: Goal: Will verbalize feelings Outcome: Progressing

## 2022-07-18 NOTE — Progress Notes (Signed)
Physical Therapy Treatment Patient Details Name: John [name] MRN: [id number] DOB: 1941-08-03 Today's Date: 07/18/2022   History of Present Illness 81 y/o M who was voluntarily admitted to geriatric psychiatry for worsening depression. Pt was d/c from the unit ~2 months ago & was noncompliatn with medications. PMH: CA, depression, DM2, epidural hematoma s/p craniotomy (age 38), Horner syndrome, HTN, migraine, obesity, sleep apnea.    PT Comments    Pt resting in recliner in day room upon PT arrival; pt agreeable to PT session; (pt and staff) report pt had not walked yet today.  During session pt SBA with transfers; SBA ambulating 240 feet with RW use (steady step through gait pattern); and CGA ambulating 160 feet (no AD use)--without UE support pt with more narrow BOS with occasional mild unsteadiness (CGA for safety but no loss of balance noted).  Will continue to focus on higher level balance and progressive functional during hospitalization.   Recommendations for follow up therapy are one component of a multi-disciplinary discharge planning process, led by the attending physician.  Recommendations may be updated based on patient status, additional functional criteria and insurance authorization.  Follow Up Recommendations  Home health PT     Assistance Recommended at Discharge Frequent or constant Supervision/Assistance  Patient can return home with the following A little help with walking and/or transfers;A little help with bathing/dressing/bathroom;Assistance with cooking/housework;Direct supervision/assist for financial management;Assist for transportation;Help with stairs or ramp for entrance;Direct supervision/assist for medications management   Equipment Recommendations  Rolling walker (2 wheels)    Recommendations for Other Services       Precautions / Restrictions Precautions Precautions: Fall Restrictions Weight Bearing Restrictions: No     Mobility  Bed Mobility                General bed mobility comments: Deferred (pt sitting in recliner in day room beginning/end of session)    Transfers Overall transfer level: Needs assistance Equipment used: Rolling walker (2 wheels) Transfers: Sit to/from Stand Sit to Stand: Supervision           General transfer comment: Pt requiring B UE support (pushing off armrests of chair) to stand up to RW but steady    Ambulation/Gait Ambulation/Gait assistance: Min guard, Supervision Gait Distance (Feet):  (240 feet with RW; 160 feet (no AD use)) Assistive device: None, Rolling walker (2 wheels)   Gait velocity: decreased     General Gait Details: steady step through gait pattern with RW use; more narrow BOS with occasionally mild unsteadiness ambulating without UE support (CGA for safety but no loss of balance noted)   Stairs             Wheelchair Mobility    Modified Rankin (Stroke Patients Only)       Balance Overall balance assessment: Needs assistance Sitting-balance support: No upper extremity supported, Feet supported Sitting balance-Leahy Scale: Good Sitting balance - Comments: steady sitting reaching within BOS   Standing balance support: No upper extremity supported, During functional activity Standing balance-Leahy Scale: Good Standing balance comment: no overt loss of balance noted ambulating without UE support                            Cognition Arousal/Alertness: Awake/alert Behavior During Therapy: WFL for tasks assessed/performed Overall Cognitive Status: Within Functional Limits for tasks assessed (A&O x4)  Exercises      General Comments  Nursing cleared pt for participation in physical therapy.  Pt agreeable to PT session.     Pertinent Vitals/Pain Pain Assessment Pain Assessment: Faces Faces Pain Scale: Hurts a little bit Pain Location: buttocks (d/t wound) Pain Descriptors /  Indicators: Discomfort Pain Intervention(s): Limited activity within patient's tolerance, Monitored during session, Repositioned Vitals (HR and O2 on room air) stable and WFL throughout treatment session.    Home Living                          Prior Function            PT Goals (current goals can now be found in the care plan section) Acute Rehab PT Goals Patient Stated Goal: none stated PT Goal Formulation: With patient Time For Goal Achievement: 07/19/22 Potential to Achieve Goals: Fair Progress towards PT goals: Progressing toward goals    Frequency    Min 2X/week      PT Plan Current plan remains appropriate    Co-evaluation              AM-PAC PT "6 Clicks" Mobility   Outcome Measure  Help needed turning from your back to your side while in a flat bed without using bedrails?: None Help needed moving from lying on your back to sitting on the side of a flat bed without using bedrails?: A Little Help needed moving to and from a bed to a chair (including a wheelchair)?: A Little Help needed standing up from a chair using your arms (e.g., wheelchair or bedside chair)?: A Little Help needed to walk in hospital room?: A Little Help needed climbing 3-5 steps with a railing? : A Little 6 Click Score: 19    End of Session Equipment Utilized During Treatment: Gait belt Activity Tolerance: Patient tolerated treatment well Patient left: in chair (in day room with nurse present) Nurse Communication: Mobility status;Precautions PT Visit Diagnosis: Unsteadiness on feet (R26.81);Muscle weakness (generalized) (M62.81)     Time: 1610-9604 PT Time Calculation (min) (ACUTE ONLY): 13 min  Charges:  $Gait Training: 8-22 mins                    Leitha Bleak, PT 07/18/22, 12:13 PM

## 2022-07-18 NOTE — Progress Notes (Signed)
Patient with sad affect.  Alert and oriented x 2.  Patient endorses "some" anxiety and depression.  Denies SI/HI and AVH.  Denies pain at this time.  RN will continue to monitor patient's pain throughout the shift.   Scheduled medications administered as ordered. No adverse side effects noted.    15 min checks in place.  Patient remains safe on the unit.    Patient is present in the milieu.  Minimal interaction with peers.

## 2022-07-18 NOTE — BHH Counselor (Signed)
CSW was contacted by the patient's wife.  Wife wanted update on the patient's "treatment plan".    CSW updated that PT continues to see patient and at this time recommendations have not changed.  She wanted update on ECT.   CSW updated that patient missed ECT due to having ate breakfast.    Wife expressed displeasure that patient missed an appointment and wanted a follow up call from Dr. Weber Cooks with why it was missed and what happens when someone misses an ECT appointment.   Assunta Curtis, MSW, LCSW 07/18/2022 9:28 AM

## 2022-07-18 NOTE — Progress Notes (Signed)
Patient assisted to the bathroom.   Desitin applied and bandage applied over sore on sacrum.Marland Kitchen

## 2022-07-18 NOTE — Plan of Care (Signed)
  Problem: Activity: Goal: Interest or engagement in activities will improve Outcome: Progressing   Problem: Safety: Goal: Periods of time without injury will increase Outcome: Progressing   Problem: Coping: Goal: Will verbalize feelings Outcome: Progressing   Problem: Metabolic: Goal: Ability to maintain appropriate glucose levels will improve Outcome: Progressing

## 2022-07-18 NOTE — Progress Notes (Signed)
Occupational Therapy Re-Evalutaion Patient Details Name: John [name] MRN: [id number] DOB: 1941/07/15 Today's Date: 07/18/2022   History of present illness 81 y/o M who was voluntarily admitted to geriatric psychiatry for worsening depression. Pt was d/c from the unit ~2 months ago & was noncompliatn with medications. PMH: CA, depression, DM2, epidural hematoma s/p craniotomy (age 70), Horner syndrome, HTN, migraine, obesity, sleep apnea.   OT comments  Mr Pro was seen for OT re-evaluation on this date. Upon arrival to room pt seated in dayroom, agreeable to tx. Pt requires SUPERVISION + RW for toilet t/f - pt walked quickly citing urgent need to use bathroom, no LOBs. SUPERVISION + rail use for pericare in standing. MIN cues don/doff pants and brief in sitting/standing - cues for sequencing. Pt initiated donning pants in standing using rail, ultimately transitioned to sitting for safety, without cues fair insight.   Pt and OT discussed safety picture cards - correctly identified correct safe vs unsafe situation on 7 scenario cards and on 8 problem solving cards. Pt was appropriately talkative while discussing scenario cards including stating the person depicted was "at risk for electrocution;  taking medication in wrong amount; and walking with hands full was a fall risk." Pt correctly identifies safety risk, elaborates on reasoning, and states safer method. Pt making good progress toward goals, updated to reflect progress, will continue to follow POC. Discharge recommendation remains appropriate.      Recommendations for follow up therapy are one component of a multi-disciplinary discharge planning process, led by the attending physician.  Recommendations may be updated based on patient status, additional functional criteria and insurance authorization.    Follow Up Recommendations  Home health OT    Assistance Recommended at Discharge Intermittent Supervision/Assistance  Patient can return  home with the following  Help with stairs or ramp for entrance;Assistance with cooking/housework   Equipment Recommendations  BSC/3in1    Recommendations for Other Services      Precautions / Restrictions Precautions Precautions: Fall Restrictions Weight Bearing Restrictions: No       Mobility Bed Mobility               General bed mobility comments: Deferred (pt sitting in recliner in day room beginning/end of session)    Transfers Overall transfer level: Needs assistance Equipment used: Rolling walker (2 wheels) Transfers: Sit to/from Stand Sit to Stand: Supervision                 Balance Overall balance assessment: Needs assistance Sitting-balance support: No upper extremity supported, Feet supported Sitting balance-Leahy Scale: Normal     Standing balance support: No upper extremity supported, During functional activity Standing balance-Leahy Scale: Good                             ADL either performed or assessed with clinical judgement   ADL Overall ADL's : Needs assistance/impaired                                       General ADL Comments: SUPERVISION + RW for toilet t/f - pt rushed citing urgent need to use bathroom. SUPERVISION + rail use for pericare in standing. MIN cues don/doff pants and brief in sitting/standing - cues for sequencing. Pt donned pants in stnading using rail, ultimately transitioned to sitting for safety, fair insight.      Cognition  Arousal/Alertness: Awake/alert Behavior During Therapy: WFL for tasks assessed/performed Overall Cognitive Status: Within Functional Limits for tasks assessed                                          Exercises Other Exercises Other Exercises: Pt discussed safety picture cards - correctly identified correct safe vs unsafe situation on 7 scenario cards and on 8 problem solving cards.            Pertinent Vitals/ Pain       Pain  Assessment Pain Assessment: No/denies pain   Frequency  Min 2X/week        Progress Toward Goals  OT Goals(current goals can now be found in the care plan section)  Progress towards OT goals: Goals met and updated - see care plan  Acute Rehab OT Goals Patient Stated Goal: to go home OT Goal Formulation: With patient Time For Goal Achievement: 08/01/22 Potential to Achieve Goals: Good ADL Goals Pt Will Perform Tub/Shower Transfer: Tub transfer;Independently;ambulating Additional ADL Goal #1: Pt will identify x3 preferred leisure tasks and initiate with no cues Additional ADL Goal #2: Pt will complete medication mgmt task with no cues  Plan Discharge plan remains appropriate;Frequency remains appropriate    Co-evaluation                 AM-PAC OT "6 Clicks" Daily Activity     Outcome Measure   Help from another person eating meals?: None Help from another person taking care of personal grooming?: None Help from another person toileting, which includes using toliet, bedpan, or urinal?: A Little Help from another person bathing (including washing, rinsing, drying)?: A Little Help from another person to put on and taking off regular upper body clothing?: A Little Help from another person to put on and taking off regular lower body clothing?: A Little 6 Click Score: 20    End of Session    OT Visit Diagnosis: Other abnormalities of gait and mobility (R26.89);Muscle weakness (generalized) (M62.81)   Activity Tolerance Patient tolerated treatment well   Patient Left in chair;with nursing/sitter in room   Nurse Communication          Time: 7847-8412 OT Time Calculation (min): 21 min  Charges: OT General Charges $OT Visit: 1 Visit OT Evaluation $OT Re-eval: 1 Re-eval OT Treatments $Self Care/Home Management : 8-22 mins  Dessie Coma, M.S. OTR/L  07/18/22, 4:40 PM  ascom [phone]

## 2022-07-18 NOTE — Group Note (Signed)
[facility] LCSW Group Therapy Note   Group Date: 07/18/2022 Start Time: 1310 End Time: 1410   Type of Therapy/Topic:  Group Therapy:  Balance in Life  Participation Level:  Minimal   Description of Group:    This group will address the concept of balance and how it feels and looks when one is unbalanced. Patients will be encouraged to process areas in their lives that are out of balance, and identify reasons for remaining unbalanced. Facilitators will guide patients utilizing problem- solving interventions to address and correct the stressor making their life unbalanced. Understanding and applying boundaries will be explored and addressed for obtaining  and maintaining a balanced life. Patients will be encouraged to explore ways to assertively make their unbalanced needs known to significant others in their lives, using other group members and facilitator for support and feedback.  Therapeutic Goals: Patient will identify two or more emotions or situations they have that consume much of in their lives. Patient will identify signs/triggers that life has become out of balance:  Patient will identify two ways to set boundaries in order to achieve balance in their lives:  Patient will demonstrate ability to communicate their needs through discussion and/or role plays  Summary of Patient Progress: Patient was present in group.  Patient reports that he thinks "stability" when he hears the word "balance".  He reports that "circumstance and loss" can make someone feel off balance.  He reports that one thing that he can do into next week to maintain balance is "get together with some friends".   Therapeutic Modalities:   Cognitive Behavioral Therapy Solution-Focused Therapy Assertiveness Training   Rozann Lesches, LCSW

## 2022-07-18 NOTE — Anesthesia Postprocedure Evaluation (Signed)
Anesthesia Post Note  Patient: John [name]  Procedure(s) Performed: ECT TX  Patient location during evaluation: PACU Anesthesia Type: General Level of consciousness: awake Pain management: pain level controlled Vital Signs Assessment: post-procedure vital signs reviewed and stable Respiratory status: spontaneous breathing and respiratory function stable Cardiovascular status: stable Anesthetic complications: no   No notable events documented.   Last Vitals:  Vitals:   07/17/22 1614 07/17/22 2002  BP: 92/73 129/86  Pulse: 72 76  Resp: 16 16  Temp: 36.9 C 37.3 C  SpO2: 98% 98%    Last Pain:  Vitals:   07/17/22 2002  TempSrc: Oral  PainSc: 0-No pain                 VAN STAVEREN,Rojean Ige

## 2022-07-18 NOTE — Progress Notes (Signed)
[facility] MD Progress Note  07/18/2022 9:01 AM John [name]  MRN:  [id number]  Subjective: Patient is a 81 y/o male with a history of depression. He appears in better spirits than yesterday. When asked how he is doing he states "pretty good". He rates his  depression as 1/10 and anxiety as 2/10. He denies SI, HI. He is taking his medication, states it is helpful, and denies any side effects. He reports a good appetite. His speech is still slowed and at a low volume. However, it appears to be faster and louder than yesterday. He did not receive ECT yesterday but will continue tomorrow.  Principal Problem: Severe recurrent major depression without psychotic features ([place]) Diagnosis: Principal Problem:   Severe recurrent major depression without psychotic features ([place]) Active Problems:   Major depressive disorder, recurrent severe without psychotic features ([place])   Pressure injury of skin  Total Time spent with patient: 30 minutes  Past Psychiatric History:  Patient has a history of past depression repeated episodes throughout his life.  1 prior suicide attempt.  Was admitted to the hospital in mid May here with depression.  Was not showing significant improvement over time and was referred for ECT.  Patient received a few ECT treatments which generally appeared to be tolerated well. He did develop an arrhythmia which resolved.  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 06/2015 CT Head: abnl thickening of mid-dist R cervical ICA w/o narrowing - ? vasculitis and thrombosed/healing dissection; b. 01/2021 Carotid U/S: 1-39% bilat ICA stenoses.   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 Age 16 - struck in head w/ baseball --> s/p craniotomy.   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 Approx 2015 - eval in [place] - conservatively managed.    Past Surgical History:  Procedure Laterality Date   ANKLE ARTHROSCOPY Left    fracture   CHOLECYSTECTOMY  2007   [place]   left frontal craniotomy for epidural hematoma   HIP ARTHROPLASTY Right 04/07/2022   Procedure: ARTHROPLASTY BIPOLAR HIP (HEMIARTHROPLASTY);  Surgeon: Earnestine Leys, MD;  Location: [facility] ORS;  Service: Orthopedics;  Laterality: Right;   INSERTION OF MESH  01/02/2022   Procedure: INSERTION OF MESH;  Surgeon: Herbert Pun, MD;  Location: [facility] ORS;  Service: General;;   LIGAMENT REPAIR Right    thumb   RETINAL DETACHMENT SURGERY     TREATMENT FISTULA ANAL  2006   Family History:  Family History  Problem Relation Age of Onset   Osteoporosis Mother    Depression Mother    Prostate cancer Father    Hypertension Father    Diabetes Brother    Kidney disease Neg Hx     Social History:  Social History   Substance and Sexual Activity  Alcohol Use Not  Currently     Social History   Substance and Sexual Activity  Drug Use No    Social History   Socioeconomic History   Marital status: Married    Spouse name: [name]   Number of children: 2   Years of education: Not on file   Highest education level: Master's degree (e.g., MA, MS, MEng, MEd, MSW, MBA)  Occupational History   Not on file  Tobacco Use   Smoking status: Never   Smokeless tobacco: Never  Vaping Use   Vaping Use: Never used  Substance and Sexual Activity   Alcohol use: Not Currently   Drug use: No   Sexual activity: Yes    Birth control/protection: None  Other Topics Concern   Not on file  Social History Narrative   Live at home  with wife.    Social Determinants of Health   Financial Resource Strain: Not on file  Food Insecurity: Food Insecurity Present (07/09/2022)   Hunger Vital Sign    Worried About Running Out of Food in the Last Year: Sometimes true    Ran Out of Food in the Last Year: Sometimes true  Transportation Needs: No Transportation Needs (07/09/2022)   PRAPARE - Hydrologist (Medical): No    Lack of Transportation (Non-Medical): No  Physical Activity: Not on file  Stress: Not on file  Social Connections: Not on file   Additional Social History:        Sleep: Good  Appetite:  Fair  Current Medications: Current Facility-Administered Medications  Medication Dose Route Frequency Provider Last Rate Last Admin   acetaminophen (TYLENOL) tablet 650 mg  650 mg Oral Q6H PRN Patrecia Pour, NP   650 mg at 07/14/22 1317   alum & mag hydroxide-simeth (MAALOX/MYLANTA) 200-200-20 MG/5ML suspension 30 mL  30 mL Oral Q4H PRN Patrecia Pour, NP       ascorbic acid (VITAMIN C) tablet 500 mg  500 mg Oral BID Parks Ranger, DO   500 mg at 07/18/22 9892   aspirin EC tablet 81 mg  81 mg Oral Daily Patrecia Pour, NP   81 mg at 07/18/22 1194   buPROPion (WELLBUTRIN XL) 24 hr tablet 300 mg  300 mg Oral Daily Patrecia Pour, NP   300 mg at 07/18/22 1740   docusate sodium (COLACE) capsule 100 mg  100 mg Oral BID Patrecia Pour, NP   100 mg at 07/18/22 8144   glycopyrrolate (ROBINUL) injection 0.1 mg  0.1 mg Intravenous Once Clapacs, John T, MD       insulin aspart (novoLOG) injection 0-6 Units  0-6 Units Subcutaneous TID WC Parks Ranger, DO   1 Units at 07/17/22 1143   ketorolac (TORADOL) 30 MG/ML injection 30 mg  30 mg Intravenous Once Clapacs, [name], MD       liver oil-zinc oxide (DESITIN) 40 % ointment   Topical BID Parks Ranger, DO   1 Application at 81/85/63 2123   magnesium hydroxide (MILK OF MAGNESIA) suspension 30 mL  30 mL Oral Daily PRN Patrecia Pour, NP       methocarbamol (ROBAXIN) tablet 500 mg  500 mg Oral Q6H PRN Patrecia Pour, NP       metoprolol tartrate (LOPRESSOR) tablet 12.5 mg  12.5 mg Oral BID Parks Ranger, DO   12.5 mg at 07/18/22 0833   modafinil (PROVIGIL) tablet 100 mg  100 mg Oral QPC breakfast Louis Meckel,  Nunzio Cory, DO   100 mg at 07/18/22 4098   multivitamin with minerals tablet 1 tablet  1 tablet Oral Daily Parks Ranger, DO   1 tablet at 07/18/22 1191   OLANZapine (ZYPREXA) tablet 10 mg  10 mg Oral QHS Parks Ranger, DO   10 mg at 07/17/22 2118   polyethylene glycol (MIRALAX / GLYCOLAX) packet 17 g  17 g Oral Daily PRN Patrecia Pour, NP       protein supplement (ENSURE MAX) liquid  11 oz Oral BID Parks Ranger, DO   11 oz at 07/18/22 4782   senna (SENOKOT) tablet 8.6 mg  1 tablet Oral BID Patrecia Pour, NP   8.6 mg at 07/17/22 2123   sertraline (ZOLOFT) tablet 50 mg  50 mg Oral QHS Patrecia Pour, NP   50 mg at 07/17/22 2116   tamsulosin (FLOMAX) capsule 0.4 mg  0.4 mg Oral QPC supper Patrecia Pour, NP   0.4 mg at 07/17/22 1732   traZODone (DESYREL) tablet 50 mg  50 mg Oral QHS PRN Patrecia Pour, NP   50 mg at 07/12/22 2127    Lab Results:  Results for orders placed or performed during the hospital encounter of 07/04/22 (from the past 48 hour(s))  Glucose, capillary     Status: None   Collection Time: 07/16/22 11:32 AM  Result Value Ref Range   Glucose-Capillary 97 70 - 99 mg/dL    Comment: Glucose reference range applies only to samples taken after fasting for at least 8 hours.  Glucose, capillary     Status: None   Collection Time: 07/16/22  4:25 PM  Result Value Ref Range   Glucose-Capillary 83 70 - 99 mg/dL    Comment: Glucose reference range applies only to samples taken after fasting for at least 8 hours.  Glucose, capillary     Status: Abnormal   Collection Time: 07/16/22  8:37 PM  Result Value Ref Range   Glucose-Capillary 103 (H) 70 - 99  mg/dL    Comment: Glucose reference range applies only to samples taken after fasting for at least 8 hours.  Glucose, capillary     Status: Abnormal   Collection Time: 07/17/22  8:43 AM  Result Value Ref Range   Glucose-Capillary 101 (H) 70 - 99 mg/dL    Comment: Glucose reference range applies only to samples taken after fasting for at least 8 hours.  Glucose, capillary     Status: Abnormal   Collection Time: 07/17/22 11:31 AM  Result Value Ref Range   Glucose-Capillary 184 (H) 70 - 99 mg/dL    Comment: Glucose reference range applies only to samples taken after fasting for at least 8 hours.  Glucose, capillary     Status: None   Collection Time: 07/17/22  4:21 PM  Result Value Ref Range   Glucose-Capillary 89 70 - 99 mg/dL    Comment: Glucose reference range applies only to samples taken after fasting for at least 8 hours.  Glucose, capillary     Status: Abnormal   Collection Time: 07/17/22  8:06 PM  Result Value Ref Range   Glucose-Capillary 151 (H) 70 - 99 mg/dL    Comment: Glucose reference range applies only to samples taken after fasting for at least 8 hours.   Comment 1 Notify RN   Glucose, capillary     Status: None   Collection Time: 07/18/22  7:47 AM  Result Value Ref Range   Glucose-Capillary 95  70 - 99 mg/dL    Comment: Glucose reference range applies only to samples taken after fasting for at least 8 hours.    Blood Alcohol level:  Lab Results  Component Value Date   ETH <10 07/02/2022   ETH <10 56/38/9373    Metabolic Disorder Labs: Lab Results  Component Value Date   HGBA1C 5.6 03/19/2022   MPG 114.02 03/19/2022   No results found for: "PROLACTIN" Lab Results  Component Value Date   CHOL 131 03/19/2022   TRIG 92 03/19/2022   HDL 33 (L) 03/19/2022   CHOLHDL 4.0 03/19/2022   VLDL 18 03/19/2022   LDLCALC 80 03/19/2022    Physical Findings: AIMS:  , ,  ,  ,    CIWA:    COWS:     Musculoskeletal: Strength & Muscle Tone: within normal limits Gait  & Station: unable to stand. Walking with walker  Patient leans: N/A  Psychiatric Specialty Exam: Physical Exam Vitals and nursing note reviewed.  HENT:     Head: Normocephalic.     Nose: Nose normal.  Pulmonary:     Effort: Pulmonary effort is normal.  Musculoskeletal:        General: Normal range of motion.     Cervical back: Normal range of motion.  Skin:    General: Skin is warm and dry.  Neurological:     General: No focal deficit present.     Mental Status: He is alert.  Psychiatric:        Mood and Affect: Mood normal.        Behavior: Behavior normal.     Review of Systems  All other systems reviewed and are negative.   Blood pressure 115/75, pulse 67, temperature 98 F (36.7 C), temperature source Oral, resp. rate 18, height '6\' 2"'$  (1.88 m), weight 70.8 kg, SpO2 99 %.Body mass index is 20.03 kg/m.  General Appearance: Neat and Well Groomed  Eye Contact:  Fair  Speech:  Slow  Volume:  Decreased  Mood:  Depressed  Affect:  Depressed and Flat  Thought Process:  Coherent  Orientation:  Full (Time, Place, and Person)  Thought Content:  WDL  Suicidal Thoughts:  No  Homicidal Thoughts:  No  Memory:  Immediate;   Fair  Judgement:  Fair  Insight:  Fair  Psychomotor Activity:  Decreased  Concentration:  Concentration: Good  Recall:  NA  Fund of Knowledge:  Fair  Language:  Good  Akathisia:  No  Handed:  Right  AIMS (if indicated):     Assets:  Social Support  ADL's:  Impaired, uses walker   Cognition:  Impaired,  Mild  Sleep:  reports slept well last night        Blood pressure 115/75, pulse 67, temperature 98 F (36.7 C), temperature source Oral, resp. rate 18, height '6\' 2"'$  (1.88 m), weight 70.8 kg, SpO2 99 %. Body mass index is 20.03 kg/m.   Treatment Plan Summary: Daily contact with patient to assess and evaluate symptoms and progress in treatment, Medication management, and Plan continue current medications. Major depressive disorder, recurrent,  severe without psychosis: Wellbutrin 300 mg daily Zyprexa 10 mg at bedtime  Stimulant: Provigil 100 mg at bedtime  Waylan Boga, NP 07/18/2022, 9:01 AM

## 2022-07-18 NOTE — Progress Notes (Signed)
Patient assisted to the bathroom 

## 2022-07-19 ENCOUNTER — Other Ambulatory Visit: Payer: Self-pay | Admitting: Psychiatry

## 2022-07-19 ENCOUNTER — Ambulatory Visit: Payer: Medicare Other

## 2022-07-19 ENCOUNTER — Inpatient Hospital Stay: Payer: Medicare Other | Admitting: Anesthesiology

## 2022-07-19 ENCOUNTER — Encounter: Payer: Self-pay | Admitting: Psychiatry

## 2022-07-19 DIAGNOSIS — F332 Major depressive disorder, recurrent severe without psychotic features: Secondary | ICD-10-CM | POA: Diagnosis not present

## 2022-07-19 LAB — GLUCOSE, CAPILLARY
Glucose-Capillary: 84 mg/dL (ref 70–99)
Glucose-Capillary: 92 mg/dL (ref 70–99)
Glucose-Capillary: 130 mg/dL — ABNORMAL HIGH (ref 70–99)
Glucose-Capillary: 163 mg/dL — ABNORMAL HIGH (ref 70–99)
Glucose-Capillary: 103 mg/dL — ABNORMAL HIGH (ref 70–99)

## 2022-07-19 MED ORDER — GLYCOPYRROLATE 0.2 MG/ML IJ SOLN: 0.1000 mg | Freq: Once | INTRAMUSCULAR | Status: DC

## 2022-07-19 MED ORDER — SODIUM CHLORIDE 0.9 % IV SOLN: INTRAVENOUS | Status: DC | PRN

## 2022-07-19 MED ORDER — SUCCINYLCHOLINE CHLORIDE 200 MG/10ML IV SOSY
PREFILLED_SYRINGE | INTRAVENOUS | Status: DC | PRN
  Administered 2022-07-19: 100 mg via INTRAVENOUS

## 2022-07-19 MED ORDER — SUCCINYLCHOLINE CHLORIDE 200 MG/10ML IV SOSY
PREFILLED_SYRINGE | INTRAVENOUS | Status: AC
  Filled 2022-07-19: qty 10

## 2022-07-19 MED ORDER — KETAMINE HCL 50 MG/5ML IJ SOSY
PREFILLED_SYRINGE | INTRAMUSCULAR | Status: AC
  Filled 2022-07-19: qty 5

## 2022-07-19 MED ORDER — LABETALOL HCL 5 MG/ML IV SOLN
INTRAVENOUS | Status: DC | PRN
  Administered 2022-07-19: 10 mg via INTRAVENOUS

## 2022-07-19 MED ORDER — MIDAZOLAM HCL 5 MG/5ML IJ SOLN
INTRAMUSCULAR | Status: DC | PRN
  Administered 2022-07-19: 2 mg via INTRAVENOUS

## 2022-07-19 MED ORDER — GLYCOPYRROLATE 0.2 MG/ML IJ SOLN
INTRAMUSCULAR | Status: AC
  Filled 2022-07-19: qty 1

## 2022-07-19 MED ORDER — KETAMINE HCL 10 MG/ML IJ SOLN
INTRAMUSCULAR | Status: DC | PRN
  Administered 2022-07-19: 100 mg via INTRAVENOUS

## 2022-07-19 MED ORDER — SODIUM CHLORIDE 0.9 % IV SOLN
500.0000 mL | Freq: Once | INTRAVENOUS | Status: AC
  Administered 2022-07-19: 500 mL via INTRAVENOUS

## 2022-07-19 MED ORDER — TAMSULOSIN HCL 0.4 MG PO CAPS
0.4000 mg | ORAL_CAPSULE | Freq: Every day | ORAL | Status: DC
  Administered 2022-07-20 – 2022-07-22 (×3): 0.4 mg via ORAL
  Filled 2022-07-19 (×3): qty 1

## 2022-07-19 MED ORDER — MIDAZOLAM HCL 2 MG/2ML IJ SOLN
INTRAMUSCULAR | Status: AC
  Filled 2022-07-19: qty 2

## 2022-07-19 NOTE — Progress Notes (Signed)
1:1 Hourly rounding  6734: Pt sleeping in room with sitter at side. Pt's breathing is unlabored and easy. Fall and raise of chest observed.  2345: Pt in room calm composed in bedroom with sitter at side.  0045: Pt sleeping in room with sitter at side. Pt's breathing is unlabored and easy. Fall and raise of chest observed.  0145: Pt sleeping in room with sitter at side. Pt's breathing is unlabored and easy. Fall and raise of chest observed.  0245: Pt sleeping in room with sitter at side. Pt's breathing is unlabored and easy. Fall and raise of chest observed.  0345: Pt sleeping in room with sitter at side. Pt's breathing is unlabored and easy. Fall and raise of chest observed.  0445: Pt sleeping in room with sitter at side. Pt's breathing is unlabored and easy. Fall and raise of chest observed.  1937: Pt sleeping in room with sitter at side. Pt's breathing is unlabored and easy. Fall and raise of chest observed.  0645: Pt sleeping in room with sitter at side. Pt's breathing is unlabored and easy. Fall and raise of chest observed.

## 2022-07-19 NOTE — Transfer of Care (Signed)
Immediate Anesthesia Transfer of Care Note  Patient: John [name]  Procedure(s) Performed: ECT TX  Patient Location: PACU  Anesthesia Type:General  Level of Consciousness: drowsy  Airway & Oxygen Therapy: Patient Spontanous Breathing and Patient connected to nasal cannula oxygen  Post-op Assessment: Report given to RN and Post -op Vital signs reviewed and stable  Post vital signs: Reviewed and stable  Last Vitals:  Vitals Value Taken Time  BP 113/77 07/19/22 1045  Temp 36.2 C 07/19/22 1043  Pulse 60 07/19/22 1045  Resp 13 07/19/22 1045  SpO2 99 % 07/19/22 1045  Vitals shown include unvalidated device data.  Last Pain:  Vitals:   07/19/22 1043  TempSrc:   PainSc: Asleep      Patients Stated Pain Goal: 0 (99/23/41 4436)  Complications: No notable events documented.

## 2022-07-19 NOTE — Progress Notes (Signed)
[facility] MD Progress Note  07/19/2022 10:44 AM John [name]  MRN:  [id number]  Subjective: Patient is a 81 y/o male with a history of depression. He is seen on rounds today after his return from ECT treatment. When asked how he is doing he states "I'm ok". He responds appropriately but responses are soft and delayed. He is compliant with medication regimen and denies any side effects. He reports a good appetite.   Principal Problem: Severe recurrent major depression without psychotic features ([place]) Diagnosis: Principal Problem:   Severe recurrent major depression without psychotic features ([place]) Active Problems:   Major depressive disorder, recurrent severe without psychotic features ([place])   Pressure injury of skin  Total Time spent with patient: 30 minutes  Past Psychiatric History:  Patient has a history of past depression repeated episodes throughout his life. 1 prior suicide attempt.  Was admitted to the hospital in mid May here with depression. Was not showing significant improvement over time and was referred for ECT.  Patient received a few ECT treatments which generally appeared to be tolerated well. He did develop an arrhythmia which resolved.  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 06/2015 CT Head: abnl thickening of mid-dist R cervical ICA w/o narrowing - ? vasculitis and thrombosed/healing dissection; b. 01/2021 Carotid U/S: 1-39% bilat ICA stenoses.   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 Age 23 - struck in head w/ baseball --> s/p craniotomy.   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 Approx 2015 - eval in [place] - conservatively managed.    Past Surgical History:  Procedure Laterality Date   ANKLE ARTHROSCOPY Left    fracture   CHOLECYSTECTOMY  2007   [place]   left frontal craniotomy for epidural hematoma   HIP ARTHROPLASTY Right 04/07/2022   Procedure: ARTHROPLASTY BIPOLAR HIP (HEMIARTHROPLASTY);  Surgeon: Earnestine Leys, MD;  Location: [facility] ORS;  Service: Orthopedics;  Laterality: Right;   INSERTION OF MESH  01/02/2022   Procedure: INSERTION OF MESH;  Surgeon: Herbert Pun, MD;  Location: [facility] ORS;  Service: General;;   LIGAMENT REPAIR Right    thumb   RETINAL DETACHMENT SURGERY     TREATMENT FISTULA ANAL  2006   Family History:  Family History  Problem Relation Age of Onset   Osteoporosis Mother    Depression Mother    Prostate cancer Father    Hypertension Father    Diabetes Brother    Kidney disease Neg Hx     Social History:  Social History   Substance and Sexual Activity  Alcohol Use Not Currently     Social History   Substance and Sexual Activity  Drug Use No    Social History   Socioeconomic History   Marital status: Married    Spouse  name: [name]   Number of children: 2   Years of education: Not on file   Highest education level: Master's degree (e.g., MA, MS, MEng, MEd, MSW, MBA)  Occupational History   Not on file  Tobacco Use   Smoking status: Never   Smokeless tobacco: Never  Vaping Use   Vaping Use: Never used  Substance and Sexual Activity   Alcohol use: Not Currently   Drug use: No   Sexual activity: Yes    Birth control/protection: None  Other Topics Concern   Not on file  Social History Narrative   Live at home with wife.    Social Determinants of Health   Financial Resource Strain: Not on file  Food Insecurity: Food Insecurity Present (07/09/2022)   Hunger Vital Sign    Worried  About Running Out of Food in the Last Year: Sometimes true    Ran Out of Food in the Last Year: Sometimes true  Transportation Needs: No Transportation Needs (07/09/2022)   PRAPARE - Hydrologist (Medical): No    Lack of Transportation (Non-Medical): No  Physical Activity: Not on file  Stress: Not on file  Social Connections: Not on file   Additional Social History:        Sleep: Good  Appetite:  Fair  Current Medications: Current Facility-Administered Medications  Medication Dose Route Frequency Provider Last Rate Last Admin   acetaminophen (TYLENOL) tablet 650 mg  650 mg Oral Q6H PRN Patrecia Pour, NP   650 mg at 07/18/22 2128   alum & mag hydroxide-simeth (MAALOX/MYLANTA) 200-200-20 MG/5ML suspension 30 mL  30 mL Oral Q4H PRN Patrecia Pour, NP       ascorbic acid (VITAMIN C) tablet 500 mg  500 mg Oral BID Parks Ranger, DO   500 mg at 07/18/22 2128   aspirin EC tablet 81 mg  81 mg Oral Daily Patrecia Pour, NP   81 mg at 07/18/22 4696   buPROPion (WELLBUTRIN XL) 24 hr tablet 300 mg  300 mg Oral Daily Patrecia Pour, NP   300 mg at 07/18/22 2952   docusate sodium (COLACE) capsule 100 mg  100 mg Oral BID Patrecia Pour, NP   100 mg at 07/18/22 2128   glycopyrrolate (ROBINUL) 0.2 MG/ML injection            insulin aspart (novoLOG) injection 0-6 Units  0-6 Units Subcutaneous TID WC Parks Ranger, DO   1 Units at 07/17/22 1143   ketorolac (TORADOL) 30 MG/ML injection 30 mg  30 mg Intravenous Once Clapacs, [name], MD       liver oil-zinc oxide (DESITIN) 40 % ointment   Topical BID Parks Ranger, DO   Given at 07/18/22 1948   magnesium hydroxide (MILK OF MAGNESIA) suspension 30 mL  30 mL Oral Daily PRN Patrecia Pour, NP       methocarbamol (ROBAXIN) tablet 500 mg  500 mg Oral Q6H PRN Patrecia Pour, NP       metoprolol tartrate (LOPRESSOR) tablet 12.5 mg  12.5 mg Oral BID Parks Ranger, DO   12.5 mg at 07/19/22  0842   modafinil (PROVIGIL) tablet 100 mg  100 mg Oral QPC breakfast Parks Ranger, DO   100 mg at 07/18/22 8413   multivitamin with minerals tablet 1 tablet  1 tablet Oral Daily Parks Ranger, DO   1 tablet at 07/18/22 0832   OLANZapine (ZYPREXA) tablet 10  mg  10 mg Oral QHS Parks Ranger, DO   10 mg at 07/18/22 2128   polyethylene glycol (MIRALAX / GLYCOLAX) packet 17 g  17 g Oral Daily PRN Patrecia Pour, NP       protein supplement (ENSURE MAX) liquid  11 oz Oral BID Parks Ranger, DO   11 oz at 07/18/22 2130   senna (SENOKOT) tablet 8.6 mg  1 tablet Oral BID Patrecia Pour, NP   8.6 mg at 07/18/22 2129   sertraline (ZOLOFT) tablet 50 mg  50 mg Oral QHS Patrecia Pour, NP   50 mg at 07/18/22 2129   tamsulosin (FLOMAX) capsule 0.4 mg  0.4 mg Oral QPC breakfast Patrecia Pour, NP       traZODone (DESYREL) tablet 50 mg  50 mg Oral QHS PRN Patrecia Pour, NP   50 mg at 07/12/22 2127   Facility-Administered Medications Ordered in Other Encounters  Medication Dose Route Frequency Provider Last Rate Last Admin   0.9 %  sodium chloride infusion   Intravenous Continuous PRN Lia Foyer, CRNA   New Bag at 07/19/22 1017   ketamine (KETALAR) injection   Intravenous Anesthesia Intra-op Lia Foyer, CRNA   100 mg at 07/19/22 1025   labetalol (NORMODYNE) injection   Intravenous Anesthesia Intra-op Lia Foyer, CRNA   10 mg at 07/19/22 1031   succinylcholine (ANECTINE) syringe   Intravenous Anesthesia Intra-op Lia Foyer, CRNA   100 mg at 07/19/22 1028    Lab Results:  Results for orders placed or performed during the hospital encounter of 07/04/22 (from the past 48 hour(s))  Glucose, capillary     Status: Abnormal   Collection Time: 07/17/22 11:31 AM  Result Value Ref Range   Glucose-Capillary 184 (H) 70 - 99 mg/dL    Comment: Glucose reference range applies only to samples taken after fasting for at least 8 hours.  Glucose,  capillary     Status: None   Collection Time: 07/17/22  4:21 PM  Result Value Ref Range   Glucose-Capillary 89 70 - 99 mg/dL    Comment: Glucose reference range applies only to samples taken after fasting for at least 8 hours.  Glucose, capillary     Status: Abnormal   Collection Time: 07/17/22  8:06 PM  Result Value Ref Range   Glucose-Capillary 151 (H) 70 - 99 mg/dL    Comment: Glucose reference range applies only to samples taken after fasting for at least 8 hours.   Comment 1 Notify RN   Glucose, capillary     Status: None   Collection Time: 07/18/22  7:47 AM  Result Value Ref Range   Glucose-Capillary 95 70 - 99 mg/dL    Comment: Glucose reference range applies only to samples taken after fasting for at least 8 hours.  Glucose, capillary     Status: None   Collection Time: 07/18/22 11:38 AM  Result Value Ref Range   Glucose-Capillary 84 70 - 99 mg/dL    Comment: Glucose reference range applies only to samples taken after fasting for at least 8 hours.  Glucose, capillary     Status: Abnormal   Collection Time: 07/18/22  4:31 PM  Result Value Ref Range   Glucose-Capillary 102 (H) 70 - 99 mg/dL    Comment: Glucose reference range applies only to samples taken after fasting for at least 8 hours.  Glucose, capillary     Status: None   Collection Time: 07/18/22  8:12 PM  Result Value Ref Range   Glucose-Capillary 95 70 - 99 mg/dL    Comment: Glucose reference range applies only to samples taken after fasting for at least 8 hours.   Comment 1 Notify RN   Glucose, capillary     Status: None   Collection Time: 07/19/22  6:26 AM  Result Value Ref Range   Glucose-Capillary 84 70 - 99 mg/dL    Comment: Glucose reference range applies only to samples taken after fasting for at least 8 hours.  Glucose, capillary     Status: None   Collection Time: 07/19/22  7:19 AM  Result Value Ref Range   Glucose-Capillary 92 70 - 99 mg/dL    Comment: Glucose reference range applies only to samples  taken after fasting for at least 8 hours.    Blood Alcohol level:  Lab Results  Component Value Date   ETH <10 07/02/2022   ETH <10 42/70/6237    Metabolic Disorder Labs: Lab Results  Component Value Date   HGBA1C 5.6 03/19/2022   MPG 114.02 03/19/2022   No results found for: "PROLACTIN" Lab Results  Component Value Date   CHOL 131 03/19/2022   TRIG 92 03/19/2022   HDL 33 (L) 03/19/2022   CHOLHDL 4.0 03/19/2022   VLDL 18 03/19/2022   LDLCALC 80 03/19/2022    Physical Findings: AIMS:  , ,  ,  ,    CIWA:    COWS:     Musculoskeletal: Strength & Muscle Tone: within normal limits Gait & Station: unable to stand. Walking with walker  Patient leans: N/A  Psychiatric Specialty Exam: Physical Exam Vitals and nursing note reviewed.  HENT:     Head: Normocephalic.     Nose: Nose normal.  Pulmonary:     Effort: Pulmonary effort is normal.  Musculoskeletal:        General: Normal range of motion.     Cervical back: Normal range of motion.  Skin:    General: Skin is warm and dry.  Neurological:     General: No focal deficit present.     Mental Status: He is alert.  Psychiatric:        Mood and Affect: Mood normal.        Behavior: Behavior normal.     Review of Systems  Psychiatric/Behavioral:  Positive for confusion.   All other systems reviewed and are negative.   Blood pressure (!) 155/87, pulse (!) 59, temperature 98.6 F (37 C), temperature source Oral, resp. rate 18, height '6\' 2"'$  (1.88 m), weight 70.8 kg, SpO2 99 %.Body mass index is 20.03 kg/m.  General Appearance: Neat and Well Groomed  Eye Contact:  Fair  Speech:  Slow  Volume:  Decreased  Mood:  Depressed  Affect:  Depressed and Flat  Thought Process:  Coherent  Orientation:  Full (Time, Place, and Person)  Thought Content:  WDL  Suicidal Thoughts:  No  Homicidal Thoughts:  No  Memory:  Immediate;   Fair  Judgement:  Fair  Insight:  Fair  Psychomotor Activity:  Decreased  Concentration:   Concentration: Good  Recall:  NA  Fund of Knowledge:  Fair  Language:  Good  Akathisia:  No  Handed:  Right  AIMS (if indicated):     Assets:  Social Support  ADL's:  Impaired, uses walker   Cognition:  Impaired,  Mild  Sleep:  reports slept well last night        Blood pressure (!) 155/87, pulse Marland Kitchen)  59, temperature 98.6 F (37 C), temperature source Oral, resp. rate 18, height '6\' 2"'$  (1.88 m), weight 70.8 kg, SpO2 99 %. Body mass index is 20.03 kg/m.   Treatment Plan Summary: Daily contact with patient to assess and evaluate symptoms and progress in treatment, Medication management, and Plan continue current medications. Major depressive disorder, recurrent, severe without psychosis: Wellbutrin 300 mg daily Zyprexa 10 mg at bedtime  Stimulant: Provigil 100 mg at bedtime  Waylan Boga, NP 07/19/2022, 10:44 AM

## 2022-07-19 NOTE — Plan of Care (Signed)
  Problem: Safety: Goal: Periods of time without injury will increase Outcome: Progressing   Problem: Coping: Goal: Will verbalize feelings Outcome: Progressing   Problem: Health Behavior/Discharge Planning: Goal: Ability to make decisions will improve Outcome: Progressing   Problem: Metabolic: Goal: Ability to maintain appropriate glucose levels will improve Outcome: Progressing

## 2022-07-19 NOTE — Procedures (Signed)
ECT SERVICES Physician's Interval Evaluation & Treatment Note  Patient Identification: Aris Even MRN:  [id number] Date of Evaluation:  07/19/2022 TX #: 2  MADRS:   MMSE:   P.E. Findings:  Withdrawn.  Little activity.  Awake but only speaking a little  Psychiatric Interval Note:  Depressed flat withdrawn  Subjective:  Patient is a 81 y.o. male seen for evaluation for Electroconvulsive Therapy. Feeling very negative  Treatment Summary:   '[x]'$   Right Unilateral             '[]'$  Bilateral   % Energy : 0.3 ms 100%   Impedance: 1930 ohms  Seizure Energy Index: 8370 V squared  Postictal Suppression Index: 73%  Seizure Concordance Index: 46% which is not a very accurate reading is one of the leads was off  Medications  Pre Shock: Robinul 0.1 mg Toradol 30 mg labetalol 10 mg ketamine 100 mg succinylcholine 100 mg  Post Shock: Versed 2 mg  Seizure Duration: 37 seconds EMG 81 seconds EEG   Comments: Follow-up Monday Wednesday and Friday next week  Lungs:  '[x]'$   Clear to auscultation               '[]'$  Other:   Heart:    '[x]'$   Regular rhythm             '[]'$  irregular rhythm    '[x]'$   Previous H&P reviewed, patient examined and there are NO CHANGES                 '[]'$   Previous H&P reviewed, patient examined and there are changes noted.   [name], MD 9/22/20233:57 PM

## 2022-07-19 NOTE — Progress Notes (Signed)
Patient ambulated around the unit with PT.

## 2022-07-19 NOTE — Group Note (Signed)
LCSW Group Therapy Note       Date/Time:  Mood: Type of Therapy and Topic: Group Therapy: Avoiding Self-Sabotaging and Enabling Behaviors Participation Level: Active Description of Group:  In this group, patients will learn how to identify obstacles, self-sabotaging and enabling behaviors, as well as: what are they, why do we do them and what needs these behaviors meet. Discuss unhealthy relationships and how to have positive healthy boundaries with those that sabotage and enable. Explore aspects of self-sabotage and enabling in yourself and how to limit these self-destructive behaviors in everyday life.     Therapeutic Goals: 1. Patient will identify one obstacle that relates to self-sabotage and enabling behaviors 2. Patient will identify one personal self-sabotaging or enabling behavior they did prior to admission 3. Patient will state a plan to change the above identified behavior 4. Patient will demonstrate ability to communicate their needs through discussion and/or role play.      Summary of Patient Progress: Patient was present in group but was eating lunch after ECT.  Patient attempted to engage in discussions but was unable to express himself.     Therapeutic Modalities:  Cognitive Behavioral Therapy Person-Centered Therapy Motivational Interviewing  Maryjane Hurter 07/19/2022  2:59 PM

## 2022-07-19 NOTE — Progress Notes (Signed)
Patient is alert and oriented x 2. Patient presents with flat affect.  Endorses feelings of anxiety and depression.  Denies SI/HI and AVH.  Denies pain at this time. RN explained to patient that he will have ECT this morning and cannot have breakfast.  Patient expressed understanding.  Only 1 BP medication given with small sip of water.   15 minute checks in place for safety. Patient remains safe on the unit.  Patient is present in the milieu with minimal interaction.   Assisted to the bathroom every 2 hours (or as needed).    Pain assessed throughout the day.

## 2022-07-19 NOTE — H&P (Signed)
John [name] is an 81 y.o. male.   Chief Complaint: Depressed mood.  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 06/2015 CT Head: abnl thickening of mid-dist R cervical ICA w/o narrowing - ? vasculitis and thrombosed/healing dissection; b. 01/2021 Carotid U/S: 1-39% bilat ICA stenoses.   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 Age 61 - struck in head w/ baseball --> s/p craniotomy.   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 Approx 2015 - eval in [place] - conservatively managed.    Past Surgical History:  Procedure Laterality Date   ANKLE ARTHROSCOPY Left    fracture   CHOLECYSTECTOMY  2007   [place]   left frontal craniotomy for epidural hematoma   HIP ARTHROPLASTY Right 04/07/2022   Procedure: ARTHROPLASTY BIPOLAR HIP (HEMIARTHROPLASTY);  Surgeon: Earnestine Leys, MD;  Location: [facility] ORS;  Service: Orthopedics;  Laterality: Right;   INSERTION OF MESH  01/02/2022   Procedure: INSERTION OF MESH;  Surgeon: Herbert Pun, MD;  Location: [facility] ORS;  Service: General;;   LIGAMENT REPAIR Right    thumb   RETINAL DETACHMENT SURGERY     TREATMENT FISTULA ANAL  2006    Family History  Problem Relation Age of Onset   Osteoporosis Mother    Depression Mother    Prostate cancer Father    Hypertension Father    Diabetes Brother    Kidney disease Neg Hx    Social History:  reports that he has never smoked. He has never used  smokeless tobacco. He reports that he does not currently use alcohol. He reports that he does not use drugs.  Allergies: No Known Allergies  Medications Prior to Admission  Medication Sig Dispense Refill   aspirin EC 81 MG tablet Take 81 mg by mouth daily.     buPROPion (WELLBUTRIN XL) 300 MG 24 hr tablet Take 1 tablet by mouth daily.     docusate sodium (COLACE) 100 MG capsule Take 1 capsule (100 mg total) by mouth 2 (two) times daily. 10 capsule 0   methocarbamol (ROBAXIN) 500 MG tablet Take 1 tablet (500 mg total) by mouth every 6 (six) hours as needed for muscle spasms. 30 tablet 0   metoprolol tartrate (LOPRESSOR) 25 MG tablet Take 1 tablet (25 mg total) by mouth 2 (two) times daily. 60 tablet 3   polyethylene glycol (MIRALAX / GLYCOLAX) 17 g packet Take 17 g by mouth daily as needed. 14 each 0   senna (SENOKOT) 8.6 MG TABS tablet Take 1 tablet (8.6 mg total) by mouth 2 (two) times daily. 120 tablet 0   sertraline (ZOLOFT) 50 MG tablet Take 1 tablet (50 mg total) by mouth at bedtime. 30 tablet 0   sertraline (ZOLOFT) 50 MG tablet Take  1 tablet (50 mg total) by mouth daily. 30 tablet 1   tamsulosin (FLOMAX) 0.4 MG CAPS capsule Take 1 capsule (0.4 mg total) by mouth daily after supper. 30 capsule 3   traZODone (DESYREL) 50 MG tablet Take 1 tablet (50 mg total) by mouth at bedtime as needed for sleep. 30 tablet 3    Results for orders placed or performed during the hospital encounter of 07/04/22 (from the past 48 hour(s))  Glucose, capillary     Status: None   Collection Time: 07/17/22  4:21 PM  Result Value Ref Range   Glucose-Capillary 89 70 - 99 mg/dL    Comment: Glucose reference range applies only to samples taken after fasting for at least 8 hours.  Glucose, capillary     Status: Abnormal   Collection Time: 07/17/22  8:06 PM  Result Value Ref Range   Glucose-Capillary 151 (H) 70 - 99 mg/dL    Comment: Glucose reference range applies only to samples taken after fasting for at  least 8 hours.   Comment 1 Notify RN   Glucose, capillary     Status: None   Collection Time: 07/18/22  7:47 AM  Result Value Ref Range   Glucose-Capillary 95 70 - 99 mg/dL    Comment: Glucose reference range applies only to samples taken after fasting for at least 8 hours.  Glucose, capillary     Status: None   Collection Time: 07/18/22 11:38 AM  Result Value Ref Range   Glucose-Capillary 84 70 - 99 mg/dL    Comment: Glucose reference range applies only to samples taken after fasting for at least 8 hours.  Glucose, capillary     Status: Abnormal   Collection Time: 07/18/22  4:31 PM  Result Value Ref Range   Glucose-Capillary 102 (H) 70 - 99 mg/dL    Comment: Glucose reference range applies only to samples taken after fasting for at least 8 hours.  Glucose, capillary     Status: None   Collection Time: 07/18/22  8:12 PM  Result Value Ref Range   Glucose-Capillary 95 70 - 99 mg/dL    Comment: Glucose reference range applies only to samples taken after fasting for at least 8 hours.   Comment 1 Notify RN   Glucose, capillary     Status: None   Collection Time: 07/19/22  6:26 AM  Result Value Ref Range   Glucose-Capillary 84 70 - 99 mg/dL    Comment: Glucose reference range applies only to samples taken after fasting for at least 8 hours.  Glucose, capillary     Status: None   Collection Time: 07/19/22  7:19 AM  Result Value Ref Range   Glucose-Capillary 92 70 - 99 mg/dL    Comment: Glucose reference range applies only to samples taken after fasting for at least 8 hours.  Glucose, capillary     Status: Abnormal   Collection Time: 07/19/22  1:01 PM  Result Value Ref Range   Glucose-Capillary 130 (H) 70 - 99 mg/dL    Comment: Glucose reference range applies only to samples taken after fasting for at least 8 hours.   No results found.  Review of Systems  Constitutional: Negative.   HENT: Negative.    Eyes: Negative.   Respiratory: Negative.    Cardiovascular: Negative.    Gastrointestinal: Negative.   Musculoskeletal: Negative.   Skin: Negative.   Neurological: Negative.   Psychiatric/Behavioral:  Positive for dysphoric mood.     Blood pressure (!) 146/90, pulse 66,  temperature 97.6 F (36.4 C), temperature source Oral, resp. rate 16, height '6\' 2"'$  (1.88 m), weight 70.8 kg, SpO2 100 %. Physical Exam Vitals reviewed.  Constitutional:      Appearance: He is well-developed.  HENT:     Head: Normocephalic and atraumatic.  Eyes:     Conjunctiva/sclera: Conjunctivae normal.     Pupils: Pupils are equal, round, and reactive to light.  Cardiovascular:     Heart sounds: Normal heart sounds.  Pulmonary:     Effort: Pulmonary effort is normal.  Abdominal:     Palpations: Abdomen is soft.  Musculoskeletal:        General: Normal range of motion.     Cervical back: Normal range of motion.  Skin:    General: Skin is warm and dry.  Neurological:     General: No focal deficit present.     Mental Status: He is alert.  Psychiatric:        Attention and Perception: He is inattentive.        Mood and Affect: Affect is blunt and flat.        Speech: He is noncommunicative.        Behavior: Behavior is withdrawn.      Assessment/Plan Tolerating ECT okay.  Next treatment today and we will follow-up in the next week with ECT for depression  [name], MD 07/19/2022, 3:56 PM

## 2022-07-19 NOTE — Anesthesia Preprocedure Evaluation (Addendum)
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 Left ventricular ejection fraction, by estimation, is >55%. The left ventricle has normal function. Left ventricular endocardial border not optimally defined to evaluate regional wall motion. There is moderate left ventricular hypertrophy. Left ventricular diastolic parameters are consistent with Grade I diastolic dysfunction (impaired relaxation).  2. Right ventricular systolic function is normal. The right ventricular size is normal. Mildly increased right ventricular wall thickness.  3. The mitral valve is grossly normal. No evidence of mitral valve regurgitation. No evidence of mitral stenosis.  4. The aortic valve was not well visualized. Aortic valve regurgitation is not visualized. No aortic stenosis is present.  5. Aortic dilatation noted. 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 Suspected due to possible serotonin syndrome. ECG on today reveals sinus rhythm with a first-degree AV block and a heart rate of 61 bpm. The patient has a CHA2DS2-VASc score of 4. Anticoagulation contraindicated at this time due to patient's history of subdural hematoma. His echocardiogram from 03/2021 revealed normal LV systolic function with estimated EF of >55%, no regional wall motion abnormalities, grade 1 diastolic dysfunction, mildly decreased RV wall thickness and moderate dilatation of the ascending aorta. -Place 7-day Holter monitor for further evaluation of atrial fibrillation burden. -Continue metoprolol titrate 25 mg by mouth twice daily. -Continue aspirin therapy. - -Counseling discussion included heart healthy lifestyle inclusive of avoidance of tobacco products, weight control, blood pressure and blood sugar control, and regular exercise regimen to include daily aerobic exercise for least 150 minutes per week, as tolerated, at a moderate intensity. - -Recommend decreasing caffeine intake, avoiding the consumption of energy drinks, staying adequately hydrated with water and electrolytes daily and practicing healthy anxiety management.  #Hypertension Chronic, well controlled. - metoprolol as above.  - -Recommend following DASH diet and monitoring blood pressure daily at home.   #HLD Chronic, controlled by diet. - -Counseling discussion included heart healthy lifestyle inclusive of avoidance of tobacco products, weight control, blood pressure and blood sugar control, and regular exercise regimen to include daily aerobic exercise for least 150 minutes per week, as tolerated, at a moderate intensity.  # h/o carotid artery  dissection # h/o subdural hematoma -Management per neurology, vascular and PCP.  #  DM type II, non-insulin dependent Chronic, reasonably controlled.  - management per PCP.   Return in about 1 month (around 06/17/2022). Discussed return precautions with patient for acute and chronic cardiovascular issues.   Reproductive/Obstetrics                            Anesthesia Physical Anesthesia Plan  ASA: 3  Anesthesia Plan: General   Post-op Pain Management:    Induction: Intravenous  PONV Risk Score and Plan: 3 and TIVA and Treatment may vary due to age or medical condition  Airway Management Planned: Natural Airway  Additional Equipment:   Intra-op Plan:   Post-operative Plan:   Informed Consent: I have reviewed the patients History and Physical, chart, labs and discussed the procedure including the risks, benefits and alternatives for the proposed anesthesia with the patient or authorized representative who has indicated his/her understanding and acceptance.       Plan Discussed with: CRNA  Anesthesia Plan Comments: (Serial consent on chart.)       Anesthesia Quick Evaluation

## 2022-07-19 NOTE — Anesthesia Postprocedure Evaluation (Signed)
Anesthesia Post Note  Patient: John [name]  Procedure(s) Performed: ECT TX  Patient location during evaluation: PACU Anesthesia Type: General Level of consciousness: awake and alert, oriented and patient cooperative Pain management: pain level controlled Vital Signs Assessment: post-procedure vital signs reviewed and stable Respiratory status: spontaneous breathing, nonlabored ventilation and respiratory function stable Cardiovascular status: blood pressure returned to baseline and stable Postop Assessment: adequate PO intake Anesthetic complications: no   No notable events documented.   Last Vitals:  Vitals:   07/19/22 1130 07/19/22 1155  BP: (!) 189/96 (!) 146/90  Pulse: 74 66  Resp: (!) 25 16  Temp:  36.4 C  SpO2: 97% 100%    Last Pain:  Vitals:   07/19/22 1155  TempSrc: Oral  PainSc:                  Darrin Nipper

## 2022-07-19 NOTE — Progress Notes (Signed)
Physical Therapy Treatment Patient Details Name: John [name] MRN: [id number] DOB: [birth year] Today's Date: 07/19/2022   History of Present Illness 81 y/o M who was voluntarily admitted to geriatric psychiatry for worsening depression. Pt was d/c from the unit ~2 months ago & was noncompliatn with medications. PMH: CA, depression, DM2, epidural hematoma s/p craniotomy (age 66), Horner syndrome, HTN, migraine, obesity, sleep apnea.    PT Comments    Pt sitting in day room in recliner upon PT arrival; pt agreeable to PT session; nurse cleared pt for therapy participation.  During session pt SBA with transfers using RW and CGA to SBA ambulating up to 200 feet with RW use. Pt requesting to use walker with ambulation today d/t feeling more secure/stable so deferred ambulation attempts without walker use.  Tolerated standing LE exercises with UE support and assist for balance as needed.  Will continue to focus on strengthening, balance, and progressive functional mobility during hospitalization.  PT POC reviewed and updated as appropriate.   Recommendations for follow up therapy are one component of a multi-disciplinary discharge planning process, led by the attending physician.  Recommendations may be updated based on patient status, additional functional criteria and insurance authorization.  Follow Up Recommendations  Home health PT     Assistance Recommended at Discharge Frequent or constant Supervision/Assistance  Patient can return home with the following A little help with walking and/or transfers;A little help with bathing/dressing/bathroom;Assistance with cooking/housework;Direct supervision/assist for financial management;Assist for transportation;Help with stairs or ramp for entrance;Direct supervision/assist for medications management   Equipment Recommendations  Rolling walker (2 wheels)    Recommendations for Other Services       Precautions / Restrictions Precautions Precautions:  Fall Restrictions Weight Bearing Restrictions: No     Mobility  Bed Mobility               General bed mobility comments: Deferred (pt sitting in recliner in day room beginning/end of session)    Transfers Overall transfer level: Needs assistance Equipment used: Rolling walker (2 wheels) Transfers: Sit to/from Stand Sit to Stand: Supervision           General transfer comment: Pt requiring B UE support (pushing off armrests of chair) to stand up to RW but steady    Ambulation/Gait Ambulation/Gait assistance: Min guard, Supervision Gait Distance (Feet):  (200 feet; 40 feet) Assistive device: Rolling walker (2 wheels) Gait Pattern/deviations: Step-through pattern, Decreased step length - right, Decreased step length - left Gait velocity: decreased compared to last session     General Gait Details: steady step through gait pattern with RW use; mildly more narrow BOS   Stairs             Wheelchair Mobility    Modified Rankin (Stroke Patients Only)       Balance Overall balance assessment: Needs assistance Sitting-balance support: No upper extremity supported, Feet supported Sitting balance-Leahy Scale: Normal Sitting balance - Comments: steady sitting reaching outside BOS   Standing balance support: No upper extremity supported Standing balance-Leahy Scale: Good Standing balance comment: steady reaching within and just outside BOS                            Cognition Arousal/Alertness: Awake/alert Behavior During Therapy: WFL for tasks assessed/performed Overall Cognitive Status: Within Functional Limits for tasks assessed  Exercises General Exercises - Lower Extremity Hip ABduction/ADduction: AROM, Strengthening, Both, 10 reps, Standing (B UE support on walker) Hip Flexion/Marching: AROM, Strengthening, Both, 10 reps, Standing (B UE support on walker) Heel Raises: AROM,  Strengthening, Both, 10 reps, Standing (B UE support on walker) Other Exercises Other Exercises: Standing hamstring curls x10 reps R and then L LE with B UE support on walker    General Comments  Nursing cleared pt for participation in physical therapy.  Pt agreeable to PT session.      Pertinent Vitals/Pain Pain Assessment Pain Assessment: No/denies pain Pain Intervention(s): Limited activity within patient's tolerance, Monitored during session, Repositioned Vitals (HR and O2 on room air) stable and WFL throughout treatment session.    Home Living                          Prior Function            PT Goals (current goals can now be found in the care plan section) Acute Rehab PT Goals Patient Stated Goal: none stated PT Goal Formulation: With patient Time For Goal Achievement: 08/02/22 Potential to Achieve Goals: Good Progress towards PT goals: Progressing toward goals    Frequency    Min 2X/week      PT Plan Current plan remains appropriate    Co-evaluation              AM-PAC PT "6 Clicks" Mobility   Outcome Measure  Help needed turning from your back to your side while in a flat bed without using bedrails?: None Help needed moving from lying on your back to sitting on the side of a flat bed without using bedrails?: A Little Help needed moving to and from a bed to a chair (including a wheelchair)?: A Little Help needed standing up from a chair using your arms (e.g., wheelchair or bedside chair)?: A Little Help needed to walk in hospital room?: A Little Help needed climbing 3-5 steps with a railing? : A Little 6 Click Score: 19    End of Session Equipment Utilized During Treatment: Gait belt Activity Tolerance: Patient tolerated treatment well Patient left: in chair (in day room with nurse present) Nurse Communication: Mobility status;Precautions PT Visit Diagnosis: Unsteadiness on feet (R26.81);Muscle weakness (generalized) (M62.81)      Time: 4008-6761 PT Time Calculation (min) (ACUTE ONLY): 16 min  Charges:  $Therapeutic Exercise: 8-22 mins                    Leitha Bleak, PT 07/19/22, 5:24 PM

## 2022-07-20 DIAGNOSIS — F332 Major depressive disorder, recurrent severe without psychotic features: Secondary | ICD-10-CM | POA: Diagnosis not present

## 2022-07-20 LAB — GLUCOSE, CAPILLARY
Glucose-Capillary: 102 mg/dL — ABNORMAL HIGH (ref 70–99)
Glucose-Capillary: 100 mg/dL — ABNORMAL HIGH (ref 70–99)
Glucose-Capillary: 109 mg/dL — ABNORMAL HIGH (ref 70–99)
Glucose-Capillary: 98 mg/dL (ref 70–99)

## 2022-07-20 NOTE — Progress Notes (Signed)
Patient doing much better today. Took himself to the bathroom unassisted. Noted ambulating with walker throughout the hall. He is sitting up out of bed and interacting with others in the day room. Appetite is great. Will continue to monitor for changes.

## 2022-07-20 NOTE — Progress Notes (Signed)
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

## 2022-07-20 NOTE — Plan of Care (Signed)
Problem: Education: Goal: Knowledge of General Education information will improve Description: Including pain rating scale, medication(s)/side effects and non-pharmacologic comfort measures Outcome: Progressing   Problem: Health Behavior/Discharge Planning: Goal: Ability to manage health-related needs will improve Outcome: Progressing   Problem: Clinical Measurements: Goal: Ability to maintain clinical measurements within normal limits will improve Outcome: Progressing Goal: Will remain free from infection Outcome: Progressing Goal: Diagnostic test results will improve Outcome: Progressing Goal: Respiratory complications will improve Outcome: Progressing Goal: Cardiovascular complication will be avoided Outcome: Progressing   Problem: Activity: Goal: Risk for activity intolerance will decrease Outcome: Progressing   Problem: Nutrition: Goal: Adequate nutrition will be maintained Outcome: Progressing   Problem: Coping: Goal: Level of anxiety will decrease Outcome: Progressing   Problem: Elimination: Goal: Will not experience complications related to bowel motility Outcome: Progressing Goal: Will not experience complications related to urinary retention Outcome: Progressing   Problem: Pain Managment: Goal: General experience of comfort will improve Outcome: Progressing   Problem: Safety: Goal: Ability to remain free from injury will improve Outcome: Progressing   Problem: Skin Integrity: Goal: Risk for impaired skin integrity will decrease Outcome: Progressing   Problem: Education: Goal: Knowledge of [place] [facility] Education information/materials will improve Outcome: Progressing Goal: Emotional status will improve Outcome: Progressing Goal: Mental status will improve Outcome: Progressing Goal: Verbalization of understanding the information provided will improve Outcome: Progressing   Problem: Activity: Goal: Interest or engagement in activities will  improve Outcome: Progressing Goal: Sleeping patterns will improve Outcome: Progressing   Problem: Coping: Goal: Ability to verbalize frustrations and anger appropriately will improve Outcome: Progressing Goal: Ability to demonstrate self-control will improve Outcome: Progressing   Problem: Health Behavior/Discharge Planning: Goal: Identification of resources available to assist in meeting health care needs will improve Outcome: Progressing Goal: Compliance with treatment plan for underlying cause of condition will improve Outcome: Progressing   Problem: Physical Regulation: Goal: Ability to maintain clinical measurements within normal limits will improve Outcome: Progressing   Problem: Safety: Goal: Periods of time without injury will increase Outcome: Progressing   Problem: Education: Goal: Utilization of techniques to improve thought processes will improve Outcome: Progressing Goal: Knowledge of the prescribed therapeutic regimen will improve Outcome: Progressing   Problem: Activity: Goal: Interest or engagement in leisure activities will improve Outcome: Progressing Goal: Imbalance in normal sleep/wake cycle will improve Outcome: Progressing   Problem: Coping: Goal: Coping ability will improve Outcome: Progressing Goal: Will verbalize feelings Outcome: Progressing   Problem: Health Behavior/Discharge Planning: Goal: Ability to make decisions will improve Outcome: Progressing Goal: Compliance with therapeutic regimen will improve Outcome: Progressing   Problem: Role Relationship: Goal: Will demonstrate positive changes in social behaviors and relationships Outcome: Progressing   Problem: Safety: Goal: Ability to disclose and discuss suicidal ideas will improve Outcome: Progressing Goal: Ability to identify and utilize support systems that promote safety will improve Outcome: Progressing   Problem: Self-Concept: Goal: Will verbalize positive feelings about  self Outcome: Progressing Goal: Level of anxiety will decrease Outcome: Progressing   Problem: Education: Goal: Ability to describe self-care measures that may prevent or decrease complications (Diabetes Survival Skills Education) will improve Outcome: Progressing Goal: Individualized Educational Video(s) Outcome: Progressing   Problem: Coping: Goal: Ability to adjust to condition or change in health will improve Outcome: Progressing   Problem: Fluid Volume: Goal: Ability to maintain a balanced intake and output will improve Outcome: Progressing   Problem: Health Behavior/Discharge Planning: Goal: Ability to identify and utilize available resources and services will improve Outcome: Progressing  Goal: Ability to manage health-related needs will improve Outcome: Progressing   Problem: Metabolic: Goal: Ability to maintain appropriate glucose levels will improve Outcome: Progressing

## 2022-07-20 NOTE — Progress Notes (Signed)
Patient is alert and oriented times 2. Mood and affect sad and sullen. Patient rates pain as 0/10. He denies SI, HI, and AVH. Patient does endorse feelings of anxiety and depression at this time. Patient states he slept well last night. Medicines administered whole by mouth without difficulty. Patient ate snack in day room; appetite good. Patient remains on unit with Q15 minute checks in place.

## 2022-07-20 NOTE — Progress Notes (Signed)
   07/20/22 0758  Psych Admission Type (Psych Patients Only)  Admission Status Voluntary  Psychosocial Assessment  Patient Complaints Anxiety;Depression  Eye Contact Fair  Facial Expression Anxious;Sad;Sullen  Affect Depressed  Speech Soft;Logical/coherent  Interaction Minimal  Motor Activity Unsteady  Appearance/Hygiene Unremarkable  Behavior Characteristics Cooperative  Mood Anxious  Thought Process  Coherency WDL  Content WDL  Delusions None reported or observed  Perception WDL  Hallucination None reported or observed  Judgment Impaired  Confusion Mild  Danger to Self  Current suicidal ideation? Denies  Danger to Others  Danger to Others None reported or observed

## 2022-07-20 NOTE — Plan of Care (Signed)
Pt endorses experiencing anxiety. Pt denies experiencing depression, SI/HI/AVH at this time. Pt endorses pain in buttocks area, prn medications given. Pt monitored q15 minute for safety per unit policy. Pt is 1:1 while sleeping with sitter at side. Pt has been calm and cooperative. Pt provided with support and encouragement. Pt is medication compliant. Plan of care ongoing.   Problem: Nutrition: Goal: Adequate nutrition will be maintained Outcome: Progressing   Problem: Pain Managment: Goal: General experience of comfort will improve Outcome: Progressing

## 2022-07-20 NOTE — Progress Notes (Signed)
1:1 Hourly Rounding  2315: Pt is sleeping, breathing is easy, unlabored, and rise and fall of chest observed. Sitter is present at bedside.  0015: Pt is sleeping, breathing is easy, unlabored, and rise and fall of chest observed. Sitter is present at bedside.  0115: Pt is sleeping, breathing is easy, unlabored, and rise and fall of chest observed. Sitter is present at bedside.  0215: Pt is sleeping, breathing is easy, unlabored, and rise and fall of chest observed. Sitter is present at bedside.  0315: Pt is sleeping, breathing is easy, unlabored, and rise and fall of chest observed. Sitter is present at bedside.  0415: Pt is sleeping, breathing is easy, unlabored, and rise and fall of chest observed. Sitter is present at bedside.  0515: Pt is sleeping, breathing is easy, unlabored, and rise and fall of chest observed. Sitter is present at bedside.  0615: Pt is sleeping, breathing is easy, unlabored, and rise and fall of chest observed. Sitter is present at bedside.

## 2022-07-20 NOTE — Progress Notes (Signed)
Patient is alert and oriented times 3. Patient is more interactive and verbally responsive today than this RN has noticed before.  Mood and affect appropriate. Patient rates pain as 1/10. He denies SI, HI, and AVH. Patient does endorse feelings of depression at this time. Patient states he slept well last night. Evening medicines administered whole by mouth without difficulty. Patient ate snack in day room; appetite was fair. Patient remains on unit with Q15 minute checks in place and 1:1 safety sitter at night time.

## 2022-07-20 NOTE — Progress Notes (Signed)
[facility] MD Progress Note  07/20/2022 3:26 PM John [name]  MRN:  [id number] Subjective: Patient seen in follow-up for this patient with depression receiving ECT.  Nursing reports he is doing very well today.  He is sitting up out of bed and interacting with others.  Seems a little bit confused but not in a bad mood.  Denies suicidal thoughts. Principal Problem: Severe recurrent major depression without psychotic features ([place]) Diagnosis: Principal Problem:   Severe recurrent major depression without psychotic features ([place]) Active Problems:   Major depressive disorder, recurrent severe without psychotic features ([place])   Pressure injury of skin  Total Time spent with patient: 30 minutes  Past Psychiatric History: Past history of recurrent depression in response to ECT  Past Medical History:  Past Medical History:  Diagnosis Date   Cancer ([place])    Depression    Depression    Diabetes mellitus without complication ([place])    type 2   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 06/2015 CT Head: abnl thickening of mid-dist R cervical ICA w/o narrowing - ? vasculitis and thrombosed/healing dissection; b. 01/2021 Carotid U/S: 1-39% bilat ICA stenoses.   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 Age 40 - struck in head w/ baseball --> s/p craniotomy.   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 Approx 2015 - eval in [place] - conservatively managed.    Past Surgical History:  Procedure Laterality Date   ANKLE ARTHROSCOPY Left    fracture   CHOLECYSTECTOMY  2007   [place]   left frontal craniotomy for epidural hematoma   HIP ARTHROPLASTY Right 04/07/2022    Procedure: ARTHROPLASTY BIPOLAR HIP (HEMIARTHROPLASTY);  Surgeon: Earnestine Leys, MD;  Location: [facility] ORS;  Service: Orthopedics;  Laterality: Right;   INSERTION OF MESH  01/02/2022   Procedure: INSERTION OF MESH;  Surgeon: Herbert Pun, MD;  Location: [facility] ORS;  Service: General;;   LIGAMENT REPAIR Right    thumb   RETINAL DETACHMENT SURGERY     TREATMENT FISTULA ANAL  2006   Family History:  Family History  Problem Relation Age of Onset   Osteoporosis Mother    Depression Mother    Prostate cancer Father    Hypertension Father    Diabetes Brother    Kidney disease Neg Hx    Family Psychiatric  History: See previous Social History:  Social History   Substance and Sexual Activity  Alcohol Use Not Currently     Social History   Substance and Sexual Activity  Drug Use No    Social History   Socioeconomic History   Marital status: Married    Spouse name: [name]   Number of children: 2   Years of education: Not on file   Highest education level: Master's degree (e.g., MA, MS, MEng, MEd, MSW, MBA)  Occupational History   Not on file  Tobacco Use   Smoking status: Never   Smokeless tobacco: Never  Vaping Use   Vaping Use: Never used  Substance and Sexual Activity   Alcohol use: Not Currently   Drug use: No   Sexual activity: Yes    Birth control/protection: None  Other Topics Concern   Not on file  Social History Narrative   Live at home with wife.    Social Determinants of Health   Financial Resource Strain: Not on file  Food Insecurity: Food Insecurity Present (07/09/2022)   Hunger Vital Sign    Worried About Running Out of Food in the Last Year: Sometimes true    Ran Out of Food in the Last Year: Sometimes true  Transportation Needs: No Transportation Needs (07/09/2022)   PRAPARE - Hydrologist (Medical): No    Lack of Transportation (Non-Medical): No  Physical Activity: Not on file  Stress: Not on file  Social  Connections: Not on file   Additional Social History:                         Sleep: Fair  Appetite:  Fair  Current Medications: Current Facility-Administered Medications  Medication Dose Route Frequency Provider Last Rate Last Admin   acetaminophen (TYLENOL) tablet 650 mg  650 mg Oral Q6H PRN Patrecia Pour, NP   650 mg at 07/20/22 0902   alum & mag hydroxide-simeth (MAALOX/MYLANTA) 200-200-20 MG/5ML suspension 30 mL  30 mL Oral Q4H PRN Patrecia Pour, NP       ascorbic acid (VITAMIN C) tablet 500 mg  500 mg Oral BID Parks Ranger, DO   500 mg at 07/20/22 0900   aspirin EC tablet 81 mg  81 mg Oral Daily Patrecia Pour, NP   81 mg at 07/20/22 0900   buPROPion (WELLBUTRIN XL) 24 hr tablet 300 mg  300 mg Oral Daily Patrecia Pour, NP   300 mg at 07/20/22 0859   docusate sodium (COLACE) capsule 100 mg  100 mg Oral BID Patrecia Pour, NP   100 mg at 07/20/22 0901   insulin aspart (novoLOG) injection 0-6 Units  0-6 Units Subcutaneous TID WC Parks Ranger, DO   1 Units at 07/19/22 1632   liver oil-zinc oxide (DESITIN) 40 % ointment   Topical BID Parks Ranger, DO   Given at 07/20/22 9767   magnesium hydroxide (MILK OF MAGNESIA) suspension 30 mL  30 mL Oral Daily PRN Patrecia Pour, NP       methocarbamol (ROBAXIN) tablet 500 mg  500 mg Oral Q6H PRN Patrecia Pour, NP       metoprolol tartrate (LOPRESSOR) tablet 12.5 mg  12.5 mg Oral BID Parks Ranger, DO   12.5 mg at 07/20/22 0901   modafinil (PROVIGIL) tablet 100 mg  100 mg Oral QPC breakfast Parks Ranger, DO   100 mg at 07/20/22 0900   multivitamin with minerals tablet 1 tablet  1 tablet Oral Daily Parks Ranger, DO   1 tablet at 07/20/22 0858   OLANZapine (ZYPREXA) tablet 10 mg  10 mg Oral QHS Parks Ranger, DO   10 mg at 07/19/22 2111   polyethylene glycol (MIRALAX / GLYCOLAX) packet 17 g  17 g Oral Daily PRN Patrecia Pour, NP       protein supplement  (ENSURE MAX) liquid  11 oz Oral BID Parks Ranger, DO   11 oz at 07/20/22 1003   senna (SENOKOT) tablet 8.6 mg  1 tablet Oral BID Patrecia Pour, NP  8.6 mg at 07/20/22 0858   sertraline (ZOLOFT) tablet 50 mg  50 mg Oral QHS Patrecia Pour, NP   50 mg at 07/19/22 2111   tamsulosin (FLOMAX) capsule 0.4 mg  0.4 mg Oral QPC breakfast Patrecia Pour, NP   0.4 mg at 07/20/22 0900   traZODone (DESYREL) tablet 50 mg  50 mg Oral QHS PRN Patrecia Pour, NP   50 mg at 07/12/22 2127    Lab Results:  Results for orders placed or performed during the hospital encounter of 07/04/22 (from the past 48 hour(s))  Glucose, capillary     Status: Abnormal   Collection Time: 07/18/22  4:31 PM  Result Value Ref Range   Glucose-Capillary 102 (H) 70 - 99 mg/dL    Comment: Glucose reference range applies only to samples taken after fasting for at least 8 hours.  Glucose, capillary     Status: None   Collection Time: 07/18/22  8:12 PM  Result Value Ref Range   Glucose-Capillary 95 70 - 99 mg/dL    Comment: Glucose reference range applies only to samples taken after fasting for at least 8 hours.   Comment 1 Notify RN   Glucose, capillary     Status: None   Collection Time: 07/19/22  6:26 AM  Result Value Ref Range   Glucose-Capillary 84 70 - 99 mg/dL    Comment: Glucose reference range applies only to samples taken after fasting for at least 8 hours.  Glucose, capillary     Status: None   Collection Time: 07/19/22  7:19 AM  Result Value Ref Range   Glucose-Capillary 92 70 - 99 mg/dL    Comment: Glucose reference range applies only to samples taken after fasting for at least 8 hours.  Glucose, capillary     Status: Abnormal   Collection Time: 07/19/22  1:01 PM  Result Value Ref Range   Glucose-Capillary 130 (H) 70 - 99 mg/dL    Comment: Glucose reference range applies only to samples taken after fasting for at least 8 hours.  Glucose, capillary     Status: Abnormal   Collection Time: 07/19/22   4:19 PM  Result Value Ref Range   Glucose-Capillary 163 (H) 70 - 99 mg/dL    Comment: Glucose reference range applies only to samples taken after fasting for at least 8 hours.  Glucose, capillary     Status: Abnormal   Collection Time: 07/19/22  7:27 PM  Result Value Ref Range   Glucose-Capillary 103 (H) 70 - 99 mg/dL    Comment: Glucose reference range applies only to samples taken after fasting for at least 8 hours.  Glucose, capillary     Status: Abnormal   Collection Time: 07/20/22  7:43 AM  Result Value Ref Range   Glucose-Capillary 102 (H) 70 - 99 mg/dL    Comment: Glucose reference range applies only to samples taken after fasting for at least 8 hours.  Glucose, capillary     Status: Abnormal   Collection Time: 07/20/22 11:47 AM  Result Value Ref Range   Glucose-Capillary 100 (H) 70 - 99 mg/dL    Comment: Glucose reference range applies only to samples taken after fasting for at least 8 hours.    Blood Alcohol level:  Lab Results  Component Value Date   [facility] <10 07/02/2022   ETH <10 08/65/7846    Metabolic Disorder Labs: Lab Results  Component Value Date   HGBA1C 5.6 03/19/2022   MPG 114.02 03/19/2022  No results found for: "PROLACTIN" Lab Results  Component Value Date   CHOL 131 03/19/2022   TRIG 92 03/19/2022   HDL 33 (L) 03/19/2022   CHOLHDL 4.0 03/19/2022   VLDL 18 03/19/2022   LDLCALC 80 03/19/2022    Physical Findings: AIMS:  , ,  ,  ,    CIWA:    COWS:     Musculoskeletal: Strength & Muscle Tone: within normal limits Gait & Station: normal Patient leans: N/A  Psychiatric Specialty Exam:  Presentation  General Appearance: Appropriate for Environment; Casual  Eye Contact:Fleeting  Speech:Garbled  Speech Volume:Decreased  Handedness:Right   Mood and Affect  Mood:Anxious; Depressed  Affect:Flat   Thought Process  Thought Processes:Linear; Coherent; Goal Directed  Descriptions of  Associations:Loose  Orientation:Partial  Thought Content:Scattered  History of Schizophrenia/Schizoaffective disorder:No  Duration of Psychotic Symptoms:No data recorded Hallucinations:No data recorded Ideas of Reference:None  Suicidal Thoughts:No data recorded Homicidal Thoughts:No data recorded  Sensorium  Memory:Recent Poor; Remote Poor  Judgment:Poor  Insight:Fair   Executive Functions  Concentration:Fair  Attention Span:Poor  Recall:Poor  Fund of Knowledge:Poor  Language:Poor   Psychomotor Activity  Psychomotor Activity:No data recorded  Assets  Assets:Financial Resources/Insurance   Sleep  Sleep:No data recorded   Physical Exam: Physical Exam Vitals reviewed.  Constitutional:      Appearance: Normal appearance.  HENT:     Head: Normocephalic and atraumatic.     Mouth/Throat:     Pharynx: Oropharynx is clear.  Eyes:     Pupils: Pupils are equal, round, and reactive to light.  Cardiovascular:     Rate and Rhythm: Normal rate and regular rhythm.  Pulmonary:     Effort: Pulmonary effort is normal.     Breath sounds: Normal breath sounds.  Abdominal:     General: Abdomen is flat.     Palpations: Abdomen is soft.  Musculoskeletal:        General: Normal range of motion.  Skin:    General: Skin is warm and dry.  Neurological:     General: No focal deficit present.     Mental Status: He is alert. Mental status is at baseline.  Psychiatric:        Attention and Perception: He is inattentive.        Mood and Affect: Mood normal. Affect is blunt.        Speech: Speech is delayed.        Behavior: Behavior is slowed.        Thought Content: Thought content normal.        Cognition and Memory: Memory is impaired.    Review of Systems  Constitutional: Negative.   HENT: Negative.    Eyes: Negative.   Respiratory: Negative.    Cardiovascular: Negative.   Gastrointestinal: Negative.   Musculoskeletal: Negative.   Skin: Negative.    Neurological: Negative.   Psychiatric/Behavioral:  The patient is nervous/anxious.    Blood pressure (!) 145/75, pulse 63, temperature 98.6 F (37 C), resp. rate 14, height '6\' 2"'$  (1.88 m), weight 70.8 kg, SpO2 99 %. Body mass index is 20.03 kg/m.   Treatment Plan Summary: Medication management and Plan no change to current treatment.  Continue plan for ECT next treatment on Monday.  No change to medication.  Reminded patient of plan.  Reviewed that n.p.o. order is already in place for Monday  [name], MD 07/20/2022, 3:26 PM

## 2022-07-21 ENCOUNTER — Other Ambulatory Visit: Payer: Self-pay | Admitting: Psychiatry

## 2022-07-21 DIAGNOSIS — F332 Major depressive disorder, recurrent severe without psychotic features: Secondary | ICD-10-CM | POA: Diagnosis not present

## 2022-07-21 LAB — GLUCOSE, CAPILLARY
Glucose-Capillary: 82 mg/dL (ref 70–99)
Glucose-Capillary: 137 mg/dL — ABNORMAL HIGH (ref 70–99)
Glucose-Capillary: 154 mg/dL — ABNORMAL HIGH (ref 70–99)
Glucose-Capillary: 82 mg/dL (ref 70–99)

## 2022-07-21 NOTE — Progress Notes (Signed)
Patient is alert and oriented times 2. Mood and affect sad and sullen. Patient rates pain as 0/10. He denies SI, HI, and AVH. Patient does endorse feelings of anxiety and depression at this time. Patient states he slept well last night. Medicines administered whole by mouth without difficulty. Patient ate breakfast in day room; appetite good. Patient remains on unit with Q15 minute checks in place.     Patient doing much better today. Took himself to the bathroom unassisted. Noted ambulating with walker throughout the hall. He is sitting up out of bed and interacting with others in the day room. Appetite is great. Will continue to monitor for changes.

## 2022-07-21 NOTE — [facility] Group Notes (Addendum)
[facility]ational Group- Patients were given two poems to read. One titled the [place], the second '' there's a hole in my sidewalk ''  Patients were asked to reminisce on times in their life in which they had a ''sharpening experience '' similar to the pencil , and what coping skills they learned and developed..  Pt did not really share much even when called upon stated '' I'm struggling to gather my thoughts'.

## 2022-07-21 NOTE — Progress Notes (Signed)
[facility] MD Progress Note  07/21/2022 12:08 PM John [name]  MRN:  [id number] Subjective: Patient seen and chart reviewed.  Patient reports feeling that he is doing pretty well.  He is up walking around the unit today.  Has more of an affect to him.  More alert and oriented.  Eating okay.  Tolerating treatment well.  Vitals stable Principal Problem: Severe recurrent major depression without psychotic features ([place]) Diagnosis: Principal Problem:   Severe recurrent major depression without psychotic features ([place]) Active Problems:   Major depressive disorder, recurrent severe without psychotic features ([place])   Pressure injury of skin  Total Time spent with patient: 30 minutes  Past Psychiatric History: Past history of recurrent depression with good response to ECT  Past Medical History:  Past Medical History:  Diagnosis Date   Cancer ([place])    Depression    Depression    Diabetes mellitus without complication ([place])    type 2   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 06/2015 CT Head: abnl thickening of mid-dist R cervical ICA w/o narrowing - ? vasculitis and thrombosed/healing dissection; b. 01/2021 Carotid U/S: 1-39% bilat ICA stenoses.   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 Age 48 - struck in head w/ baseball --> s/p craniotomy.   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 Approx 2015 - eval in [place] - conservatively managed.    Past Surgical History:  Procedure Laterality Date   ANKLE ARTHROSCOPY Left    fracture   CHOLECYSTECTOMY  2007   [place]   left frontal craniotomy for epidural hematoma   HIP ARTHROPLASTY Right 04/07/2022    Procedure: ARTHROPLASTY BIPOLAR HIP (HEMIARTHROPLASTY);  Surgeon: Earnestine Leys, MD;  Location: [facility] ORS;  Service: Orthopedics;  Laterality: Right;   INSERTION OF MESH  01/02/2022   Procedure: INSERTION OF MESH;  Surgeon: Herbert Pun, MD;  Location: [facility] ORS;  Service: General;;   LIGAMENT REPAIR Right    thumb   RETINAL DETACHMENT SURGERY     TREATMENT FISTULA ANAL  2006   Family History:  Family History  Problem Relation Age of Onset   Osteoporosis Mother    Depression Mother    Prostate cancer Father    Hypertension Father    Diabetes Brother    Kidney disease Neg Hx    Family Psychiatric  History: Supportive counseling and therapy review of treatment plan.  We will plan on ECT tomorrow and then ongoing evaluation as it goes. Social History:  Social History   Substance and Sexual Activity  Alcohol Use Not Currently     Social History   Substance and Sexual Activity  Drug Use No    Social History   Socioeconomic History   Marital status: Married    Spouse name: [name]   Number of children: 2   Years of education: Not on file   Highest education level: Master's degree (e.g., MA, MS, MEng, MEd, MSW, MBA)  Occupational History   Not on file  Tobacco Use  Smoking status: Never   Smokeless tobacco: Never  Vaping Use   Vaping Use: Never used  Substance and Sexual Activity   Alcohol use: Not Currently   Drug use: No   Sexual activity: Yes    Birth control/protection: None  Other Topics Concern   Not on file  Social History Narrative   Live at home with wife.    Social Determinants of Health   Financial Resource Strain: Not on file  Food Insecurity: Food Insecurity Present (07/09/2022)   Hunger Vital Sign    Worried About Running Out of Food in the Last Year: Sometimes true    Ran Out of Food in the Last Year: Sometimes true  Transportation Needs: No Transportation Needs (07/09/2022)   PRAPARE - Hydrologist (Medical): No     Lack of Transportation (Non-Medical): No  Physical Activity: Not on file  Stress: Not on file  Social Connections: Not on file   Additional Social History:                         Sleep: Fair  Appetite:  Fair  Current Medications: Current Facility-Administered Medications  Medication Dose Route Frequency Provider Last Rate Last Admin   acetaminophen (TYLENOL) tablet 650 mg  650 mg Oral Q6H PRN Patrecia Pour, NP   650 mg at 07/20/22 0902   alum & mag hydroxide-simeth (MAALOX/MYLANTA) 200-200-20 MG/5ML suspension 30 mL  30 mL Oral Q4H PRN Patrecia Pour, NP       ascorbic acid (VITAMIN C) tablet 500 mg  500 mg Oral BID Parks Ranger, DO   500 mg at 07/21/22 1610   aspirin EC tablet 81 mg  81 mg Oral Daily Patrecia Pour, NP   81 mg at 07/21/22 0854   buPROPion (WELLBUTRIN XL) 24 hr tablet 300 mg  300 mg Oral Daily Patrecia Pour, NP   300 mg at 07/21/22 0855   docusate sodium (COLACE) capsule 100 mg  100 mg Oral BID Patrecia Pour, NP   100 mg at 07/21/22 0853   insulin aspart (novoLOG) injection 0-6 Units  0-6 Units Subcutaneous TID WC Parks Ranger, DO   1 Units at 07/19/22 1632   liver oil-zinc oxide (DESITIN) 40 % ointment   Topical BID Parks Ranger, DO   Given at 07/21/22 0902   magnesium hydroxide (MILK OF MAGNESIA) suspension 30 mL  30 mL Oral Daily PRN Patrecia Pour, NP       methocarbamol (ROBAXIN) tablet 500 mg  500 mg Oral Q6H PRN Patrecia Pour, NP       metoprolol tartrate (LOPRESSOR) tablet 12.5 mg  12.5 mg Oral BID Parks Ranger, DO   12.5 mg at 07/21/22 0856   modafinil (PROVIGIL) tablet 100 mg  100 mg Oral QPC breakfast Parks Ranger, DO   100 mg at 07/21/22 9604   multivitamin with minerals tablet 1 tablet  1 tablet Oral Daily Parks Ranger, DO   1 tablet at 07/21/22 0854   OLANZapine (ZYPREXA) tablet 10 mg  10 mg Oral QHS Parks Ranger, DO   10 mg at 07/20/22 2110   polyethylene  glycol (MIRALAX / GLYCOLAX) packet 17 g  17 g Oral Daily PRN Patrecia Pour, NP       protein supplement (ENSURE MAX) liquid  11 oz Oral BID Parks Ranger, DO   11 oz at 07/21/22 [phone]  senna (SENOKOT) tablet 8.6 mg  1 tablet Oral BID Patrecia Pour, NP   8.6 mg at 07/21/22 9326   sertraline (ZOLOFT) tablet 50 mg  50 mg Oral QHS Patrecia Pour, NP   50 mg at 07/20/22 2110   tamsulosin (FLOMAX) capsule 0.4 mg  0.4 mg Oral QPC breakfast Patrecia Pour, NP   0.4 mg at 07/21/22 0854   traZODone (DESYREL) tablet 50 mg  50 mg Oral QHS PRN Patrecia Pour, NP   50 mg at 07/12/22 2127    Lab Results:  Results for orders placed or performed during the hospital encounter of 07/04/22 (from the past 48 hour(s))  Glucose, capillary     Status: Abnormal   Collection Time: 07/19/22  1:01 PM  Result Value Ref Range   Glucose-Capillary 130 (H) 70 - 99 mg/dL    Comment: Glucose reference range applies only to samples taken after fasting for at least 8 hours.  Glucose, capillary     Status: Abnormal   Collection Time: 07/19/22  4:19 PM  Result Value Ref Range   Glucose-Capillary 163 (H) 70 - 99 mg/dL    Comment: Glucose reference range applies only to samples taken after fasting for at least 8 hours.  Glucose, capillary     Status: Abnormal   Collection Time: 07/19/22  7:27 PM  Result Value Ref Range   Glucose-Capillary 103 (H) 70 - 99 mg/dL    Comment: Glucose reference range applies only to samples taken after fasting for at least 8 hours.  Glucose, capillary     Status: Abnormal   Collection Time: 07/20/22  7:43 AM  Result Value Ref Range   Glucose-Capillary 102 (H) 70 - 99 mg/dL    Comment: Glucose reference range applies only to samples taken after fasting for at least 8 hours.  Glucose, capillary     Status: Abnormal   Collection Time: 07/20/22 11:47 AM  Result Value Ref Range   Glucose-Capillary 100 (H) 70 - 99 mg/dL    Comment: Glucose reference range applies only to samples  taken after fasting for at least 8 hours.  Glucose, capillary     Status: Abnormal   Collection Time: 07/20/22  3:53 PM  Result Value Ref Range   Glucose-Capillary 109 (H) 70 - 99 mg/dL    Comment: Glucose reference range applies only to samples taken after fasting for at least 8 hours.  Glucose, capillary     Status: None   Collection Time: 07/20/22  7:53 PM  Result Value Ref Range   Glucose-Capillary 98 70 - 99 mg/dL    Comment: Glucose reference range applies only to samples taken after fasting for at least 8 hours.  Glucose, capillary     Status: None   Collection Time: 07/21/22  7:28 AM  Result Value Ref Range   Glucose-Capillary 82 70 - 99 mg/dL    Comment: Glucose reference range applies only to samples taken after fasting for at least 8 hours.  Glucose, capillary     Status: Abnormal   Collection Time: 07/21/22 11:07 AM  Result Value Ref Range   Glucose-Capillary 137 (H) 70 - 99 mg/dL    Comment: Glucose reference range applies only to samples taken after fasting for at least 8 hours.   Comment 1 Notify RN     Blood Alcohol level:  Lab Results  Component Value Date   ETH <10 07/02/2022   ETH <10 71/24/5809    Metabolic Disorder Labs: Lab Results  Component Value Date   HGBA1C 5.6 03/19/2022   MPG 114.02 03/19/2022   No results found for: "PROLACTIN" Lab Results  Component Value Date   CHOL 131 03/19/2022   TRIG 92 03/19/2022   HDL 33 (L) 03/19/2022   CHOLHDL 4.0 03/19/2022   VLDL 18 03/19/2022   LDLCALC 80 03/19/2022    Physical Findings: AIMS:  , ,  ,  ,    CIWA:    COWS:     Musculoskeletal: Strength & Muscle Tone: within normal limits Gait & Station: normal Patient leans: N/A  Psychiatric Specialty Exam:  Presentation  General Appearance: Appropriate for Environment; Casual  Eye Contact:Fleeting  Speech:Garbled  Speech Volume:Decreased  Handedness:Right   Mood and Affect  Mood:Anxious; Depressed  Affect:Flat   Thought Process   Thought Processes:Linear; Coherent; Goal Directed  Descriptions of Associations:Loose  Orientation:Partial  Thought Content:Scattered  History of Schizophrenia/Schizoaffective disorder:No  Duration of Psychotic Symptoms:No data recorded Hallucinations:No data recorded Ideas of Reference:None  Suicidal Thoughts:No data recorded Homicidal Thoughts:No data recorded  Sensorium  Memory:Recent Poor; Remote Poor  Judgment:Poor  Insight:Fair   Executive Functions  Concentration:Fair  Attention Span:Poor  Recall:Poor  Fund of Knowledge:Poor  Language:Poor   Psychomotor Activity  Psychomotor Activity:No data recorded  Assets  Assets:Financial Resources/Insurance   Sleep  Sleep:No data recorded   Physical Exam: Physical Exam Vitals and nursing note reviewed.  Constitutional:      Appearance: Normal appearance.  HENT:     Head: Normocephalic and atraumatic.     Mouth/Throat:     Pharynx: Oropharynx is clear.  Eyes:     Pupils: Pupils are equal, round, and reactive to light.  Cardiovascular:     Rate and Rhythm: Normal rate and regular rhythm.  Pulmonary:     Effort: Pulmonary effort is normal.     Breath sounds: Normal breath sounds.  Abdominal:     General: Abdomen is flat.     Palpations: Abdomen is soft.  Musculoskeletal:        General: Normal range of motion.  Skin:    General: Skin is warm and dry.  Neurological:     General: No focal deficit present.     Mental Status: He is alert. Mental status is at baseline.  Psychiatric:        Attention and Perception: Attention normal.        Mood and Affect: Mood normal. Affect is blunt.        Speech: Speech normal.        Behavior: Behavior is cooperative.        Thought Content: Thought content normal.    Review of Systems  Constitutional: Negative.   HENT: Negative.    Eyes: Negative.   Respiratory: Negative.    Cardiovascular: Negative.   Gastrointestinal: Negative.   Musculoskeletal:  Negative.   Skin: Negative.   Neurological: Negative.   Psychiatric/Behavioral: Negative.     Blood pressure 108/61, pulse 70, temperature 98.4 F (36.9 C), temperature source Oral, resp. rate 18, height '6\' 2"'$  (1.88 m), weight 70.8 kg, SpO2 96 %. Body mass index is 20.03 kg/m.   Treatment Plan Summary: Medication management and Plan continue ECT management next treatment tomorrow  [name], MD 07/21/2022, 12:08 PM

## 2022-07-21 NOTE — BH IP Treatment Plan (Signed)
Interdisciplinary Treatment and Diagnostic Plan Update  07/21/2022 Time of Session: 10:15AM John [name] MRN: [id number]  Principal Diagnosis: Severe recurrent major depression without psychotic features [facility])  Secondary Diagnoses: Principal Problem:   Severe recurrent major depression without psychotic features ([place]) Active Problems:   Major depressive disorder, recurrent severe without psychotic features ([place])   Pressure injury of skin   Current Medications:  Current Facility-Administered Medications  Medication Dose Route Frequency Provider Last Rate Last Admin   acetaminophen (TYLENOL) tablet 650 mg  650 mg Oral Q6H PRN Patrecia Pour, NP   650 mg at 07/20/22 0902   alum & mag hydroxide-simeth (MAALOX/MYLANTA) 200-200-20 MG/5ML suspension 30 mL  30 mL Oral Q4H PRN Patrecia Pour, NP       ascorbic acid (VITAMIN C) tablet 500 mg  500 mg Oral BID Parks Ranger, DO   500 mg at 07/21/22 6226   aspirin EC tablet 81 mg  81 mg Oral Daily Patrecia Pour, NP   81 mg at 07/21/22 0854   buPROPion (WELLBUTRIN XL) 24 hr tablet 300 mg  300 mg Oral Daily Patrecia Pour, NP   300 mg at 07/21/22 0855   docusate sodium (COLACE) capsule 100 mg  100 mg Oral BID Patrecia Pour, NP   100 mg at 07/21/22 0853   insulin aspart (novoLOG) injection 0-6 Units  0-6 Units Subcutaneous TID WC Parks Ranger, DO   1 Units at 07/19/22 1632   liver oil-zinc oxide (DESITIN) 40 % ointment   Topical BID Parks Ranger, DO   Given at 07/21/22 0902   magnesium hydroxide (MILK OF MAGNESIA) suspension 30 mL  30 mL Oral Daily PRN Patrecia Pour, NP       methocarbamol (ROBAXIN) tablet 500 mg  500 mg Oral Q6H PRN Patrecia Pour, NP       metoprolol tartrate (LOPRESSOR) tablet 12.5 mg  12.5 mg Oral BID Parks Ranger, DO   12.5 mg at 07/21/22 0856   modafinil (PROVIGIL) tablet 100 mg  100 mg Oral QPC breakfast Parks Ranger, DO   100 mg at 07/21/22 3335   multivitamin with  minerals tablet 1 tablet  1 tablet Oral Daily Parks Ranger, DO   1 tablet at 07/21/22 0854   OLANZapine (ZYPREXA) tablet 10 mg  10 mg Oral QHS Parks Ranger, DO   10 mg at 07/20/22 2110   polyethylene glycol (MIRALAX / GLYCOLAX) packet 17 g  17 g Oral Daily PRN Patrecia Pour, NP       protein supplement (ENSURE MAX) liquid  11 oz Oral BID Parks Ranger, DO   11 oz at 07/21/22 0904   senna (SENOKOT) tablet 8.6 mg  1 tablet Oral BID Patrecia Pour, NP   8.6 mg at 07/21/22 4562   sertraline (ZOLOFT) tablet 50 mg  50 mg Oral QHS Patrecia Pour, NP   50 mg at 07/20/22 2110   tamsulosin (FLOMAX) capsule 0.4 mg  0.4 mg Oral QPC breakfast Patrecia Pour, NP   0.4 mg at 07/21/22 0854   traZODone (DESYREL) tablet 50 mg  50 mg Oral QHS PRN Patrecia Pour, NP   50 mg at 07/12/22 2127   PTA Medications: Medications Prior to Admission  Medication Sig Dispense Refill Last Dose   aspirin EC 81 MG tablet Take 81 mg by mouth daily.   07/14/2022   buPROPion (WELLBUTRIN XL) 300 MG 24 hr tablet Take 1  tablet by mouth daily.   07/14/2022   docusate sodium (COLACE) 100 MG capsule Take 1 capsule (100 mg total) by mouth 2 (two) times daily. 10 capsule 0 07/14/2022   methocarbamol (ROBAXIN) 500 MG tablet Take 1 tablet (500 mg total) by mouth every 6 (six) hours as needed for muscle spasms. 30 tablet 0 07/14/2022   metoprolol tartrate (LOPRESSOR) 25 MG tablet Take 1 tablet (25 mg total) by mouth 2 (two) times daily. 60 tablet 3 07/14/2022   polyethylene glycol (MIRALAX / GLYCOLAX) 17 g packet Take 17 g by mouth daily as needed. 14 each 0 07/14/2022   senna (SENOKOT) 8.6 MG TABS tablet Take 1 tablet (8.6 mg total) by mouth 2 (two) times daily. 120 tablet 0 07/14/2022   sertraline (ZOLOFT) 50 MG tablet Take 1 tablet (50 mg total) by mouth at bedtime. 30 tablet 0 07/14/2022   sertraline (ZOLOFT) 50 MG tablet Take 1 tablet (50 mg total) by mouth daily. 30 tablet 1 07/14/2022   tamsulosin (FLOMAX)  0.4 MG CAPS capsule Take 1 capsule (0.4 mg total) by mouth daily after supper. 30 capsule 3 07/14/2022   traZODone (DESYREL) 50 MG tablet Take 1 tablet (50 mg total) by mouth at bedtime as needed for sleep. 30 tablet 3 07/14/2022    Patient Stressors: Marital or family conflict   Medication change or noncompliance    Patient Strengths: Scientist, research (life sciences)  Supportive family/friends   Treatment Modalities: Medication Management, Group therapy, Case management,  1 to 1 session with clinician, Psychoeducation, Recreational therapy.   Physician Treatment Plan for Primary Diagnosis: Severe recurrent major depression without psychotic features ([place]) Long Term Goal(s): Improvement in symptoms so as ready for discharge   Short Term Goals: Ability to identify changes in lifestyle to reduce recurrence of condition will improve Ability to verbalize feelings will improve Ability to disclose and discuss suicidal ideas Ability to demonstrate self-control will improve Ability to identify and develop effective coping behaviors will improve Ability to maintain clinical measurements within normal limits will improve Compliance with prescribed medications will improve Ability to identify triggers associated with substance abuse/mental health issues will improve  Medication Management: Evaluate patient's response, side effects, and tolerance of medication regimen.  Therapeutic Interventions: 1 to 1 sessions, Unit Group sessions and Medication administration.  Evaluation of Outcomes: Progressing  Physician Treatment Plan for Secondary Diagnosis: Principal Problem:   Severe recurrent major depression without psychotic features ([place]) Active Problems:   Major depressive disorder, recurrent severe without psychotic features ([place])   Pressure injury of skin  Long Term Goal(s): Improvement in symptoms so as ready for discharge   Short Term Goals: Ability to identify changes in lifestyle to reduce recurrence of  condition will improve Ability to verbalize feelings will improve Ability to disclose and discuss suicidal ideas Ability to demonstrate self-control will improve Ability to identify and develop effective coping behaviors will improve Ability to maintain clinical measurements within normal limits will improve Compliance with prescribed medications will improve Ability to identify triggers associated with substance abuse/mental health issues will improve     Medication Management: Evaluate patient's response, side effects, and tolerance of medication regimen.  Therapeutic Interventions: 1 to 1 sessions, Unit Group sessions and Medication administration.  Evaluation of Outcomes: Progressing   RN Treatment Plan for Primary Diagnosis: Severe recurrent major depression without psychotic features ([place]) Long Term Goal(s): Knowledge of disease and therapeutic regimen to maintain health will improve  Short Term Goals: Ability to demonstrate self-control, Ability to participate in decision  making will improve, Ability to verbalize feelings will improve, Ability to disclose and discuss suicidal ideas, Ability to identify and develop effective coping behaviors will improve, and Compliance with prescribed medications will improve  Medication Management: RN will administer medications as ordered by provider, will assess and evaluate patient's response and provide education to patient for prescribed medication. RN will report any adverse and/or side effects to prescribing provider.  Therapeutic Interventions: 1 on 1 counseling sessions, Psychoeducation, Medication administration, Evaluate responses to treatment, Monitor vital signs and CBGs as ordered, Perform/monitor CIWA, COWS, AIMS and Fall Risk screenings as ordered, Perform wound care treatments as ordered.  Evaluation of Outcomes: Progressing   LCSW Treatment Plan for Primary Diagnosis: Severe recurrent major depression without psychotic features  ([place]) Long Term Goal(s): Safe transition to appropriate next level of care at discharge, Engage patient in therapeutic group addressing interpersonal concerns.  Short Term Goals: Engage patient in aftercare planning with referrals and resources, Increase social support, Increase ability to appropriately verbalize feelings, Increase emotional regulation, Facilitate acceptance of mental health diagnosis and concerns, and Increase skills for wellness and recovery  Therapeutic Interventions: Assess for all discharge needs, 1 to 1 time with Social worker, Explore available resources and support systems, Assess for adequacy in community support network, Educate family and significant other(s) on suicide prevention, Complete Psychosocial Assessment, Interpersonal group therapy.  Evaluation of Outcomes: Progressing   Progress in Treatment: Attending groups: Yes. Participating in groups: Yes. Taking medication as prescribed: Yes. Toleration medication: Yes. Family/Significant other contact made: Yes, individual(s) contacted:  SPE completed with the patient's wife. Patient understands diagnosis: Yes. Discussing patient identified problems/goals with staff: Yes. Medical problems stabilized or resolved: Yes. Denies suicidal/homicidal ideation: Yes. Issues/concerns per patient self-inventory: No. Other: none  New problem(s) identified: No, Describe:  none Update 07/10/2022:  No changes at this time. Update 07/15/2022:  No changes at this time.  Update 07/21/2022:  No changes at this time.    New Short Term/Long Term Goal(s):  medication management for mood stabilization; elimination of SI thoughts; development of comprehensive mental wellness plan.  Update 07/10/2022:  No changes at this time. Update 07/15/2022:  No changes at this time. Update 07/21/2022:  No changes at this time.    Patient Goals:  "get better"Update 07/10/2022:  No changes at this time. Update 07/15/2022:  No changes at this time.  Update  07/21/2022:  No changes at this time.    Discharge Plan or Barriers: CSW to assist patient in development of appropriate discharge plans. Patient struggled with speech during treatment team. Update 07/10/2022:  Patient reports plans to return home.  Patient reports that he would like to continue with his current mental health provider.  Patient continues to work with PT/OT and has plans to begin ECT.  Changes to discharge treatment pending recommendations of other disciplines working with the patient. Update 07/15/2022:  No changes at this time. Update 07/21/2022:  Patient has begun ECT at this time.  Patient continues to progress.  Patient continues with PT and recommendations remain the same for the patient to return home with home health PT.    Reason for Continuation of Hospitalization: Anxiety Depression Medical Issues Medication stabilization   Estimated Length of Stay:  1-7 days Update 07/10/2022:  No changes at this time.  Update 07/15/2022:  TBD  Last 3 [place] Suicide Severity Risk Score: Flowsheet Row Admission (Current) from 07/04/2022 in [place] ED from 07/03/2022 in [place] Office Visit from  06/11/2022 in confidential department  C-SSRS RISK CATEGORY No Risk No Risk Error: Q3, 4, or 5 should not be populated when Q2 is No       Last PHQ 2/9 Scores:    06/11/2022    2:20 PM 05/20/2022    1:11 PM 12/21/2021    8:59 AM  Depression screen PHQ 2/9  Decreased Interest 3 1 0  Down, Depressed, Hopeless 1 1 0  PHQ - 2 Score 4 2 0  Altered sleeping 1 1   Tired, decreased energy 1 2   Change in appetite 2 1   Feeling bad or failure about yourself  1 1   Trouble concentrating 2 2   Moving slowly or fidgety/restless 2 2   Suicidal thoughts 0 0   PHQ-9 Score 13 11   Difficult doing work/chores Somewhat difficult      Scribe for Treatment Team: Rozann Lesches, LCSW 07/21/2022 10:12 AM

## 2022-07-21 NOTE — Plan of Care (Signed)
  Problem: Education: Goal: Knowledge of General Education information will improve Description: Including pain rating scale, medication(s)/side effects and non-pharmacologic comfort measures Outcome: Progressing   Problem: Health Behavior/Discharge Planning: Goal: Ability to manage health-related needs will improve Outcome: Progressing   Problem: Clinical Measurements: Goal: Ability to maintain clinical measurements within normal limits will improve Outcome: Progressing Goal: Will remain free from infection Outcome: Progressing Goal: Diagnostic test results will improve Outcome: Progressing Goal: Respiratory complications will improve Outcome: Progressing Goal: Cardiovascular complication will be avoided Outcome: Progressing   Problem: Activity: Goal: Risk for activity intolerance will decrease Outcome: Progressing   Problem: Nutrition: Goal: Adequate nutrition will be maintained Outcome: Progressing   Problem: Coping: Goal: Level of anxiety will decrease Outcome: Progressing   Problem: Elimination: Goal: Will not experience complications related to bowel motility Outcome: Progressing Goal: Will not experience complications related to urinary retention Outcome: Progressing   Problem: Pain Managment: Goal: General experience of comfort will improve Outcome: Progressing   Problem: Safety: Goal: Ability to remain free from injury will improve Outcome: Progressing   Problem: Education: Goal: Knowledge of [place] [facility] Education information/materials will improve Outcome: Progressing Goal: Emotional status will improve Outcome: Progressing Goal: Mental status will improve Outcome: Progressing Goal: Verbalization of understanding the information provided will improve Outcome: Progressing   Problem: Activity: Goal: Interest or engagement in activities will improve Outcome: Progressing Goal: Sleeping patterns will improve Outcome: Progressing   Problem:  Education: Goal: Utilization of techniques to improve thought processes will improve Outcome: Progressing Goal: Knowledge of the prescribed therapeutic regimen will improve Outcome: Progressing   Problem: Activity: Goal: Interest or engagement in leisure activities will improve Outcome: Progressing Goal: Imbalance in normal sleep/wake cycle will improve Outcome: Progressing   Problem: Coping: Goal: Coping ability will improve Outcome: Progressing Goal: Will verbalize feelings Outcome: Progressing   Problem: Health Behavior/Discharge Planning: Goal: Ability to make decisions will improve Outcome: Progressing Goal: Compliance with therapeutic regimen will improve Outcome: Progressing   Problem: Role Relationship: Goal: Will demonstrate positive changes in social behaviors and relationships Outcome: Progressing   Problem: Safety: Goal: Ability to disclose and discuss suicidal ideas will improve Outcome: Progressing Goal: Ability to identify and utilize support systems that promote safety will improve Outcome: Progressing   Problem: Coping: Goal: Ability to adjust to condition or change in health will improve Outcome: Progressing   Problem: Fluid Volume: Goal: Ability to maintain a balanced intake and output will improve Outcome: Progressing   Problem: Health Behavior/Discharge Planning: Goal: Ability to identify and utilize available resources and services will improve Outcome: Progressing Goal: Ability to manage health-related needs will improve Outcome: Progressing   Problem: Metabolic: Goal: Ability to maintain appropriate glucose levels will improve Outcome: Progressing

## 2022-07-21 NOTE — Group Note (Signed)
[facility] LCSW Group Therapy Note   Group Date: 07/21/2022 Start Time: 1330 End Time: 1430   Type of Therapy and Topic: Group Therapy: Avoiding Self-Sabotaging and Enabling Behaviors  Participation Level: None  Mood:  Description of Group:  In this group, patients will learn how to identify obstacles, self-sabotaging and enabling behaviors, as well as: what are they, why do we do them and what needs these behaviors meet. Discuss unhealthy relationships and how to have positive healthy boundaries with those that sabotage and enable. Explore aspects of self-sabotage and enabling in yourself and how to limit these self-destructive behaviors in everyday life.   Therapeutic Goals: 1. Patient will identify one obstacle that relates to self-sabotage and enabling behaviors 2. Patient will identify one personal self-sabotaging or enabling behavior they did prior to admission 3. Patient will state a plan to change the above identified behavior 4. Patient will demonstrate ability to communicate their needs through discussion and/or role play.    Summary of Patient Progress: Patient was present in group.  Patient was attentive though less engaged than desired.   Therapeutic Modalities:  Cognitive Behavioral Therapy Person-Centered Therapy Motivational Interviewing    Rozann Lesches, LCSW

## 2022-07-21 NOTE — Progress Notes (Signed)
Patient is alert and oriented times 3. Mood and affect appropriate. Patient rates pain as 0/10. He denies SI, HI, and AVH. Patient also denies feelings of anxiety and depression at this time. Patient states he slept well last night. Evening medicines administered whole by mouth without difficulty. Patient ate snack in day room; appetite was fair. Patient remains on unit with Q15 minute checks in place and 1:1 safety sitter while asleep.

## 2022-07-21 NOTE — [facility] Group Notes (Signed)
Pt attended recreation group in the courtyard.

## 2022-07-22 ENCOUNTER — Encounter: Payer: Self-pay | Admitting: Psychiatry

## 2022-07-22 ENCOUNTER — Inpatient Hospital Stay: Payer: Medicare Other | Admitting: Certified Registered Nurse Anesthetist

## 2022-07-22 ENCOUNTER — Ambulatory Visit: Payer: Medicare Other

## 2022-07-22 DIAGNOSIS — F332 Major depressive disorder, recurrent severe without psychotic features: Secondary | ICD-10-CM | POA: Diagnosis not present

## 2022-07-22 LAB — GLUCOSE, CAPILLARY
Glucose-Capillary: 101 mg/dL — ABNORMAL HIGH (ref 70–99)
Glucose-Capillary: 117 mg/dL — ABNORMAL HIGH (ref 70–99)
Glucose-Capillary: 119 mg/dL — ABNORMAL HIGH (ref 70–99)
Glucose-Capillary: 283 mg/dL — ABNORMAL HIGH (ref 70–99)
Glucose-Capillary: 80 mg/dL (ref 70–99)

## 2022-07-22 MED ORDER — SUCCINYLCHOLINE CHLORIDE 200 MG/10ML IV SOSY
PREFILLED_SYRINGE | INTRAVENOUS | Status: AC
  Filled 2022-07-22: qty 10

## 2022-07-22 MED ORDER — SODIUM CHLORIDE 0.9 % IV SOLN: INTRAVENOUS | Status: DC | PRN

## 2022-07-22 MED ORDER — LABETALOL HCL 5 MG/ML IV SOLN
INTRAVENOUS | Status: DC | PRN
  Administered 2022-07-22: 10 mg via INTRAVENOUS

## 2022-07-22 MED ORDER — KETAMINE HCL 50 MG/5ML IJ SOSY
PREFILLED_SYRINGE | INTRAMUSCULAR | Status: AC
  Filled 2022-07-22: qty 5

## 2022-07-22 MED ORDER — KETAMINE HCL 10 MG/ML IJ SOLN
INTRAMUSCULAR | Status: DC | PRN
  Administered 2022-07-22: 100 mg via INTRAVENOUS

## 2022-07-22 MED ORDER — LABETALOL HCL 5 MG/ML IV SOLN
INTRAVENOUS | Status: AC
  Filled 2022-07-22: qty 4

## 2022-07-22 MED ORDER — SUCCINYLCHOLINE CHLORIDE 200 MG/10ML IV SOSY
PREFILLED_SYRINGE | INTRAVENOUS | Status: DC | PRN
  Administered 2022-07-22: 100 mg via INTRAVENOUS

## 2022-07-22 MED ORDER — MIDAZOLAM HCL 2 MG/2ML IJ SOLN
INTRAMUSCULAR | Status: AC
  Filled 2022-07-22: qty 2

## 2022-07-22 MED ORDER — MIDAZOLAM HCL 2 MG/2ML IJ SOLN
INTRAMUSCULAR | Status: DC | PRN
  Administered 2022-07-22: 2 mg via INTRAVENOUS

## 2022-07-22 NOTE — Procedures (Signed)
ECT SERVICES Physician's Interval Evaluation & Treatment Note  Patient Identification: Pawel Soules MRN:  [id number] Date of Evaluation:  07/22/2022 TX #: 3  MADRS:   MMSE:   P.E. Findings:  No change to physical exam  Psychiatric Interval Note:  More alert and more energetic subjectively feeling better  Subjective:  Patient is a 81 y.o. male seen for evaluation for Electroconvulsive Therapy. Less depressed  Treatment Summary:   '[x]'$   Right Unilateral             '[]'$  Bilateral   % Energy : 0.3 ms 100%   Impedance: 1830 ohms  Seizure Energy Index: 5411 V squared  Postictal Suppression Index: 85%  Seizure Concordance Index: 97%  Medications  Pre Shock: Robinul 0.1 mg labetalol 10 mg ketamine 100 mg succinylcholine 100 mg  Post Shock: Versed 2 mg  Seizure Duration: 26 seconds EMG 68 seconds EEG   Comments: Continue course with next treatment Wednesday  Lungs:  '[x]'$   Clear to auscultation               '[]'$  Other:   Heart:    '[x]'$   Regular rhythm             '[]'$  irregular rhythm    '[x]'$   Previous H&P reviewed, patient examined and there are NO CHANGES                 '[]'$   Previous H&P reviewed, patient examined and there are changes noted.   [name], MD 9/25/20232:24 PM

## 2022-07-22 NOTE — Progress Notes (Signed)
Patient alert and oriented 4x. Patient in good mood. Patient vocal in speaking with staff about his needs. Patient denies SI, HI, AVH, and pain. Patient stated, " I slept good and am hungry". Patient openly conversing with Probation officer. Patient engaging with others and group activities.    Scheduled medications administered to patient, per MD orders; blood pressure medications prior to ECT. Support and encouragement provided.  Routine safety checks conducted every 15 minutes.  Patient informed to notify staff with problems or concerns.   No adverse drug reactions noted. Patient contracts for safety at this time. Patient compliant with medications and treatment plan. Patient receptive, calm, and cooperative. Patient interacts well with others on the unit.  Patient remains safe at this time.    Problem: Coping: Goal: Level of anxiety will decrease Outcome: Progressing   Problem: Education: Goal: Mental status will improve Outcome: Progressing   Problem: Activity: Goal: Interest or engagement in activities will improve Outcome: Progressing   Problem: Activity: Goal: Interest or engagement in leisure activities will improve Outcome: Progressing   Problem: Coping: Goal: Will verbalize feelings Outcome: Progressing

## 2022-07-22 NOTE — Progress Notes (Signed)
Patient pulse low at 0752 assessment check. Pulse rechecked manually and still running soft at 59-60 bpm. Patient reassessed at 1046 and BP decreased to 109/77 with a pulse of 61. MD notified. No BP meds have been administered at this time by Probation officer.

## 2022-07-22 NOTE — Anesthesia Preprocedure Evaluation (Signed)
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 Left ventricular ejection fraction, by estimation, is >55%. The left ventricle has normal function. Left ventricular endocardial border not optimally defined to evaluate regional wall motion. There is moderate left ventricular hypertrophy. Left ventricular diastolic parameters are consistent with Grade I diastolic dysfunction (impaired relaxation).  2. Right ventricular systolic function is normal. The right ventricular size is normal. Mildly increased right ventricular wall thickness.  3. The mitral valve is grossly normal. No evidence of mitral valve regurgitation. No evidence of mitral stenosis.  4. The aortic valve was not well visualized. Aortic valve regurgitation is not visualized. No aortic stenosis is present.  5. Aortic dilatation noted. 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 Suspected due to possible serotonin syndrome. ECG on today reveals sinus rhythm with a first-degree AV block and a heart rate of 61 bpm. The patient has a CHA2DS2-VASc score of 4. Anticoagulation contraindicated at this time due to patient's history of subdural hematoma. His echocardiogram from 03/2021 revealed normal LV systolic function with estimated EF of >55%, no regional wall motion abnormalities, grade 1 diastolic dysfunction, mildly decreased RV wall thickness and moderate dilatation of the ascending aorta. -Place 7-day Holter monitor for further evaluation of atrial fibrillation burden. -Continue metoprolol titrate 25 mg by mouth twice daily. -Continue aspirin therapy. - -Counseling discussion included heart healthy lifestyle inclusive of avoidance of tobacco products, weight control, blood pressure and blood sugar control, and regular exercise regimen to include daily aerobic exercise for least 150 minutes per week, as tolerated, at a moderate intensity. - -Recommend decreasing caffeine intake, avoiding the consumption of energy drinks, staying adequately hydrated with water and electrolytes daily and practicing healthy anxiety management.  #Hypertension Chronic, well controlled. - metoprolol as above.  - -Recommend following DASH diet and monitoring blood pressure daily at home.   #HLD Chronic, controlled by diet. - -Counseling discussion included heart healthy lifestyle inclusive of avoidance of tobacco products, weight control, blood pressure and blood sugar control, and regular exercise regimen to include daily aerobic exercise for least 150 minutes per week, as tolerated, at a moderate intensity.  # h/o carotid artery  dissection # h/o subdural hematoma -Management per neurology, vascular and PCP.  #  DM type II, non-insulin dependent Chronic, reasonably controlled.  - management per PCP.   Return in about 1 month (around 06/17/2022). Discussed return precautions with patient for acute and chronic cardiovascular issues.   Reproductive/Obstetrics                             Anesthesia Physical  Anesthesia Plan  ASA: 3  Anesthesia Plan: General   Post-op Pain Management:    Induction: Intravenous  PONV Risk Score and Plan: 3 and TIVA and Treatment may vary due to age or medical condition  Airway Management Planned: Natural Airway  Additional Equipment:   Intra-op Plan:   Post-operative Plan:   Informed Consent: I have reviewed the patients History and Physical, chart, labs and discussed the procedure including the risks, benefits and alternatives for the proposed anesthesia with the patient or authorized representative who has indicated his/her understanding and acceptance.       Plan Discussed with: CRNA  Anesthesia Plan Comments: (Serial consent on chart.)        Anesthesia Quick Evaluation

## 2022-07-22 NOTE — Progress Notes (Signed)
[facility] MD Progress Note  07/22/2022 10:22 AM Greogry Goodwyn  MRN:  [id number] Subjective: John [name] is seen on rounds.  He started ECT.  He says that he is feeling better.  He is more verbal today than usual.  He is able to answer questions appropriately.  He denies any side effects from his medications.  His depression seems to be improving.  Nurses informed me that Turrell seems to urinate more at nighttime even though he is getting Flomax in the morning.  Principal Problem: Severe recurrent major depression without psychotic features ([place]) Diagnosis: Principal Problem:   Severe recurrent major depression without psychotic features ([name]) Active Problems:   Major depressive disorder, recurrent severe without psychotic features ([place])   Pressure injury of skin  Total Time spent with patient: 15 minutes  Past Psychiatric History:   Patient has a history of past depression repeated episodes throughout his life.  1 prior suicide attempt.  Was admitted to the hospital in mid May here with depression.  Was not showing significant improvement over time and was referred for ECT.  Patient received a few ECT treatments which generally appeared to be tolerated well.  This past weekend however he decompensated for unclear reasons at the very end of Sunday and prior to treatment on Monday was seen to be delirious with an arrhythmia.  Arrhythmia has stabilized.  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 06/2015 CT Head: abnl thickening of mid-dist R cervical ICA w/o narrowing - ? vasculitis and thrombosed/healing dissection; b. 01/2021 Carotid U/S: 1-39% bilat ICA stenoses.   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 Age 26 - struck in head w/ baseball --> s/p craniotomy.   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)    a. Approx 2015 - eval in [place] - conservatively managed.    Past Surgical History:  Procedure Laterality Date   ANKLE ARTHROSCOPY Left    fracture   CHOLECYSTECTOMY  2007   [place]   left frontal craniotomy for epidural hematoma   HIP ARTHROPLASTY Right 04/07/2022   Procedure: ARTHROPLASTY BIPOLAR HIP (HEMIARTHROPLASTY);  Surgeon: Earnestine Leys, MD;  Location: [facility] ORS;  Service: Orthopedics;  Laterality: Right;   INSERTION OF MESH  01/02/2022   Procedure: INSERTION OF MESH;  Surgeon: Herbert Pun, MD;  Location: [facility] ORS;  Service: General;;   LIGAMENT REPAIR Right    thumb   RETINAL DETACHMENT SURGERY     TREATMENT FISTULA ANAL  2006   Family History:  Family History  Problem Relation Age of Onset   Osteoporosis Mother    Depression Mother    Prostate cancer Father    Hypertension Father    Diabetes Brother    Kidney disease Neg Hx    Social History:  Social History   Substance and Sexual Activity  Alcohol Use Not Currently     Social History   Substance and Sexual Activity  Drug Use No    Social History   Socioeconomic History   Marital status: Married    Spouse name: [name]   Number of children: 2   Years of education: Not on file   Highest education level: Master's degree (e.g., MA, MS, MEng, MEd, MSW, MBA)  Occupational History   Not on file  Tobacco Use   Smoking status: Never   Smokeless tobacco: Never  Vaping Use   Vaping Use: Never used  Substance and Sexual Activity   Alcohol use: Not Currently   Drug use: No   Sexual activity: Yes    Birth control/protection: None  Other Topics Concern   Not on file  Social History Narrative    Live at home with wife.    Social Determinants of Health   Financial Resource Strain: Not on file  Food Insecurity: Food Insecurity Present (07/09/2022)   Hunger Vital Sign    Worried About Running Out of Food in the Last Year: Sometimes true    Ran Out of Food in the Last Year: Sometimes true  Transportation Needs: No Transportation Needs (07/09/2022)   PRAPARE - Hydrologist (Medical): No    Lack of Transportation (Non-Medical): No  Physical Activity: Not on file  Stress: Not on file  Social Connections: Not on file   Additional Social History:                         Sleep: Good  Appetite:  Fair  Current Medications: Current Facility-Administered Medications  Medication Dose Route Frequency Provider Last Rate Last Admin   acetaminophen (TYLENOL) tablet 650 mg  650 mg Oral Q6H PRN Patrecia Pour, NP   650 mg at 07/20/22 0902   alum & mag hydroxide-simeth (MAALOX/MYLANTA) 200-200-20 MG/5ML suspension 30 mL  30 mL Oral Q4H PRN Patrecia Pour, NP       ascorbic acid (VITAMIN C) tablet 500 mg  500 mg Oral BID Parks Ranger, DO   500 mg at 07/21/22 2103   aspirin EC tablet 81 mg  81 mg Oral Daily Patrecia Pour, NP   81 mg at 07/22/22 0805   buPROPion (WELLBUTRIN XL) 24 hr tablet 300 mg  300 mg Oral Daily Patrecia Pour, NP   300 mg at 07/21/22 0855   docusate sodium (COLACE) capsule 100 mg  100 mg Oral BID Patrecia Pour, NP   100 mg at 07/21/22 2103   insulin aspart (novoLOG) injection 0-6 Units  0-6 Units Subcutaneous TID WC Parks Ranger, DO   1 Units at 07/21/22 1628   liver oil-zinc oxide (DESITIN) 40 % ointment   Topical BID Parks Ranger, DO   Given at 07/21/22 2104   magnesium hydroxide (MILK OF MAGNESIA) suspension 30 mL  30 mL Oral Daily PRN Patrecia Pour, NP       methocarbamol (ROBAXIN) tablet 500 mg  500 mg Oral Q6H PRN Patrecia Pour, NP       metoprolol tartrate (LOPRESSOR) tablet 12.5 mg  12.5  mg Oral BID Parks Ranger, DO   12.5 mg at 07/21/22 2103   modafinil (PROVIGIL) tablet 100 mg  100 mg Oral QPC breakfast Parks Ranger, DO   100 mg at 07/21/22 4259   multivitamin with minerals tablet 1 tablet  1 tablet Oral Daily  Parks Ranger, DO   1 tablet at 07/21/22 0854   OLANZapine (ZYPREXA) tablet 10 mg  10 mg Oral QHS Parks Ranger, DO   10 mg at 07/21/22 2103   polyethylene glycol (MIRALAX / GLYCOLAX) packet 17 g  17 g Oral Daily PRN Patrecia Pour, NP       protein supplement (ENSURE MAX) liquid  11 oz Oral BID Parks Ranger, DO   11 oz at 07/21/22 2102   senna (SENOKOT) tablet 8.6 mg  1 tablet Oral BID Patrecia Pour, NP   8.6 mg at 07/21/22 2103   sertraline (ZOLOFT) tablet 50 mg  50 mg Oral QHS Patrecia Pour, NP   50 mg at 07/21/22 2103   traZODone (DESYREL) tablet 50 mg  50 mg Oral QHS PRN Patrecia Pour, NP   50 mg at 07/12/22 2127    Lab Results:  Results for orders placed or performed during the hospital encounter of 07/04/22 (from the past 48 hour(s))  Glucose, capillary     Status: Abnormal   Collection Time: 07/20/22 11:47 AM  Result Value Ref Range   Glucose-Capillary 100 (H) 70 - 99 mg/dL    Comment: Glucose reference range applies only to samples taken after fasting for at least 8 hours.  Glucose, capillary     Status: Abnormal   Collection Time: 07/20/22  3:53 PM  Result Value Ref Range   Glucose-Capillary 109 (H) 70 - 99 mg/dL    Comment: Glucose reference range applies only to samples taken after fasting for at least 8 hours.  Glucose, capillary     Status: None   Collection Time: 07/20/22  7:53 PM  Result Value Ref Range   Glucose-Capillary 98 70 - 99 mg/dL    Comment: Glucose reference range applies only to samples taken after fasting for at least 8 hours.  Glucose, capillary     Status: None   Collection Time: 07/21/22  7:28 AM  Result Value Ref Range   Glucose-Capillary 82 70 - 99 mg/dL    Comment:  Glucose reference range applies only to samples taken after fasting for at least 8 hours.  Glucose, capillary     Status: Abnormal   Collection Time: 07/21/22 11:07 AM  Result Value Ref Range   Glucose-Capillary 137 (H) 70 - 99 mg/dL    Comment: Glucose reference range applies only to samples taken after fasting for at least 8 hours.   Comment 1 Notify RN   Glucose, capillary     Status: Abnormal   Collection Time: 07/21/22  4:08 PM  Result Value Ref Range   Glucose-Capillary 154 (H) 70 - 99 mg/dL    Comment: Glucose reference range applies only to samples taken after fasting for at least 8 hours.  Glucose, capillary     Status: None   Collection Time: 07/21/22  7:54 PM  Result Value Ref Range   Glucose-Capillary 82 70 - 99 mg/dL    Comment: Glucose reference range applies only to samples taken after fasting for at least 8 hours.  Glucose, capillary     Status: Abnormal   Collection Time: 07/22/22  7:27 AM  Result Value Ref Range   Glucose-Capillary 101 (H) 70 - 99 mg/dL    Comment: Glucose reference range applies only to samples taken after fasting for at least 8 hours.    Blood Alcohol level:  Lab Results  Component Value Date   ETH <10 07/02/2022   ETH <10 03/12/2022  Metabolic Disorder Labs: Lab Results  Component Value Date   HGBA1C 5.6 03/19/2022   MPG 114.02 03/19/2022   No results found for: "PROLACTIN" Lab Results  Component Value Date   CHOL 131 03/19/2022   TRIG 92 03/19/2022   HDL 33 (L) 03/19/2022   CHOLHDL 4.0 03/19/2022   VLDL 18 03/19/2022   LDLCALC 80 03/19/2022    Physical Findings: AIMS:  , ,  ,  ,    CIWA:    COWS:     Musculoskeletal: Strength & Muscle Tone: within normal limits Gait & Station: normal Patient leans: N/A  Psychiatric Specialty Exam:  Presentation  General Appearance: Appropriate for Environment; Casual  Eye Contact:Fleeting  Speech:Garbled  Speech Volume:Decreased  Handedness:Right   Mood and Affect   Mood:Anxious; Depressed  Affect:Flat   Thought Process  Thought Processes:Linear; Coherent; Goal Directed  Descriptions of Associations:Loose  Orientation:Partial  Thought Content:Scattered  History of Schizophrenia/Schizoaffective disorder:No  Duration of Psychotic Symptoms:No data recorded Hallucinations:No data recorded Ideas of Reference:None  Suicidal Thoughts:No data recorded Homicidal Thoughts:No data recorded  Sensorium  Memory:Recent Poor; Remote Poor  Judgment:Poor  Insight:Fair   Executive Functions  Concentration:Fair  Attention Span:Poor  Recall:Poor  Fund of Knowledge:Poor  Language:Poor   Psychomotor Activity  Psychomotor Activity:No data recorded  Assets  Assets:Financial Resources/Insurance   Sleep  Sleep:No data recorded   Physical Exam: Physical Exam Vitals and nursing note reviewed.  Constitutional:      Appearance: Normal appearance. He is normal weight.  Neurological:     General: No focal deficit present.     Mental Status: He is alert and oriented to person, place, and time.  Psychiatric:        Attention and Perception: Attention and perception normal.        Mood and Affect: Mood is depressed. Affect is flat.        Speech: Speech is delayed.        Behavior: Behavior normal. Behavior is cooperative.        Thought Content: Thought content normal.        Cognition and Memory: Cognition is impaired. Memory is impaired.        Judgment: Judgment normal.    Review of Systems  Constitutional: Negative.   HENT: Negative.    Eyes: Negative.   Respiratory: Negative.    Cardiovascular: Negative.   Gastrointestinal: Negative.   Genitourinary: Negative.   Musculoskeletal: Negative.   Skin: Negative.   Neurological: Negative.   Endo/Heme/Allergies: Negative.   Psychiatric/Behavioral:  Positive for depression.    Blood pressure 126/82, pulse (!) 59, temperature 97.9 F (36.6 C), temperature source Oral, resp. rate  16, height '6\' 2"'$  (1.88 m), weight 70.8 kg, SpO2 97 %. Body mass index is 20.03 kg/m.   Treatment Plan Summary: Daily contact with patient to assess and evaluate symptoms and progress in treatment, Medication management, and Plan discontinue Flomax for now.  Continue ECT and current medicines.  [place], DO 07/22/2022, 10:22 AM

## 2022-07-22 NOTE — Progress Notes (Addendum)
RN assisted patient with toileting.  RN offered to apply Desitin cream and clean bandages to sore spots on sacrum.  Patient declined.  Patient also denied pain.  RN encouraged patient to make staff aware if his needs changed.     Patient changed into clean scrubs and brief.  Hands and face washed. Hair brushed.

## 2022-07-22 NOTE — Transfer of Care (Signed)
Immediate Anesthesia Transfer of Care Note  Patient: John [name]  Procedure(s) Performed: ECT TX  Patient Location: PACU  Anesthesia Type:General  Level of Consciousness: drowsy  Airway & Oxygen Therapy: Patient Spontanous Breathing and Patient connected to face mask oxygen  Post-op Assessment: Report given to RN and Post -op Vital signs reviewed and stable  Post vital signs: Reviewed and stable  Last Vitals:  Vitals Value Taken Time  BP 134/69 07/22/22 1251  Temp 36.1 C 07/22/22 1251  Pulse 57 07/22/22 1255  Resp 11 07/22/22 1255  SpO2 100 % 07/22/22 1255  Vitals shown include unvalidated device data.  Last Pain:  Vitals:   07/22/22 1251  TempSrc:   PainSc: Asleep      Patients Stated Pain Goal: 0 (33/74/45 1460)  Complications: No notable events documented.

## 2022-07-22 NOTE — H&P (Signed)
John [name] is an 81 y.o. male.   Chief Complaint: Mood is feeling better.  No acute physical symptoms. 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 06/2015 CT Head: abnl thickening of mid-dist R cervical ICA w/o narrowing - ? vasculitis and thrombosed/healing dissection; b. 01/2021 Carotid U/S: 1-39% bilat ICA stenoses.   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 Age 72 - struck in head w/ baseball --> s/p craniotomy.   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 Approx 2015 - eval in [place] - conservatively managed.    Past Surgical History:  Procedure Laterality Date   ANKLE ARTHROSCOPY Left    fracture   CHOLECYSTECTOMY  2007   [place]   left frontal craniotomy for epidural hematoma   HIP ARTHROPLASTY Right 04/07/2022   Procedure: ARTHROPLASTY BIPOLAR HIP (HEMIARTHROPLASTY);  Surgeon: Earnestine Leys, MD;  Location: [facility] ORS;  Service: Orthopedics;  Laterality: Right;   INSERTION OF MESH  01/02/2022   Procedure: INSERTION OF MESH;  Surgeon: Herbert Pun, MD;  Location: [facility] ORS;  Service: General;;   LIGAMENT REPAIR Right    thumb   RETINAL DETACHMENT SURGERY     TREATMENT FISTULA ANAL  2006    Family History  Problem Relation Age of Onset   Osteoporosis Mother    Depression Mother    Prostate cancer Father    Hypertension Father    Diabetes Brother    Kidney disease Neg Hx    Social History:  reports that he has never smoked. He has never  used smokeless tobacco. He reports that he does not currently use alcohol. He reports that he does not use drugs.  Allergies: No Known Allergies  Medications Prior to Admission  Medication Sig Dispense Refill   aspirin EC 81 MG tablet Take 81 mg by mouth daily.     buPROPion (WELLBUTRIN XL) 300 MG 24 hr tablet Take 1 tablet by mouth daily.     docusate sodium (COLACE) 100 MG capsule Take 1 capsule (100 mg total) by mouth 2 (two) times daily. 10 capsule 0   methocarbamol (ROBAXIN) 500 MG tablet Take 1 tablet (500 mg total) by mouth every 6 (six) hours as needed for muscle spasms. 30 tablet 0   metoprolol tartrate (LOPRESSOR) 25 MG tablet Take 1 tablet (25 mg total) by mouth 2 (two) times daily. 60 tablet 3   polyethylene glycol (MIRALAX / GLYCOLAX) 17 g packet Take 17 g by mouth daily as needed. 14 each 0   senna (SENOKOT) 8.6 MG TABS tablet Take 1 tablet (8.6 mg total) by mouth 2 (two) times daily. 120 tablet 0   sertraline (ZOLOFT) 50 MG tablet Take 1 tablet (50 mg total) by mouth at bedtime. 30 tablet 0   sertraline (ZOLOFT) 50 MG  tablet Take 1 tablet (50 mg total) by mouth daily. 30 tablet 1   tamsulosin (FLOMAX) 0.4 MG CAPS capsule Take 1 capsule (0.4 mg total) by mouth daily after supper. 30 capsule 3   traZODone (DESYREL) 50 MG tablet Take 1 tablet (50 mg total) by mouth at bedtime as needed for sleep. 30 tablet 3    Results for orders placed or performed during the hospital encounter of 07/04/22 (from the past 48 hour(s))  Glucose, capillary     Status: Abnormal   Collection Time: 07/20/22  3:53 PM  Result Value Ref Range   Glucose-Capillary 109 (H) 70 - 99 mg/dL    Comment: Glucose reference range applies only to samples taken after fasting for at least 8 hours.  Glucose, capillary     Status: None   Collection Time: 07/20/22  7:53 PM  Result Value Ref Range   Glucose-Capillary 98 70 - 99 mg/dL    Comment: Glucose reference range applies only to samples taken after fasting for at  least 8 hours.  Glucose, capillary     Status: None   Collection Time: 07/21/22  7:28 AM  Result Value Ref Range   Glucose-Capillary 82 70 - 99 mg/dL    Comment: Glucose reference range applies only to samples taken after fasting for at least 8 hours.  Glucose, capillary     Status: Abnormal   Collection Time: 07/21/22 11:07 AM  Result Value Ref Range   Glucose-Capillary 137 (H) 70 - 99 mg/dL    Comment: Glucose reference range applies only to samples taken after fasting for at least 8 hours.   Comment 1 Notify RN   Glucose, capillary     Status: Abnormal   Collection Time: 07/21/22  4:08 PM  Result Value Ref Range   Glucose-Capillary 154 (H) 70 - 99 mg/dL    Comment: Glucose reference range applies only to samples taken after fasting for at least 8 hours.  Glucose, capillary     Status: None   Collection Time: 07/21/22  7:54 PM  Result Value Ref Range   Glucose-Capillary 82 70 - 99 mg/dL    Comment: Glucose reference range applies only to samples taken after fasting for at least 8 hours.  Glucose, capillary     Status: Abnormal   Collection Time: 07/22/22  7:27 AM  Result Value Ref Range   Glucose-Capillary 101 (H) 70 - 99 mg/dL    Comment: Glucose reference range applies only to samples taken after fasting for at least 8 hours.  Glucose, capillary     Status: Abnormal   Collection Time: 07/22/22 11:40 AM  Result Value Ref Range   Glucose-Capillary 117 (H) 70 - 99 mg/dL    Comment: Glucose reference range applies only to samples taken after fasting for at least 8 hours.  Glucose, capillary     Status: Abnormal   Collection Time: 07/22/22  2:16 PM  Result Value Ref Range   Glucose-Capillary 119 (H) 70 - 99 mg/dL    Comment: Glucose reference range applies only to samples taken after fasting for at least 8 hours.   No results found.  Review of Systems  Constitutional: Negative.   HENT: Negative.    Eyes: Negative.   Respiratory: Negative.    Cardiovascular: Negative.    Gastrointestinal: Negative.   Musculoskeletal: Negative.   Skin: Negative.   Neurological: Negative.   Psychiatric/Behavioral: Negative.      Blood pressure 132/73, pulse 62, temperature (!) 97 F (36.1 C), resp.  rate (!) 28, height '6\' 2"'$  (1.88 m), weight 70.8 kg, SpO2 98 %. Physical Exam Vitals and nursing note reviewed.  Constitutional:      Appearance: He is well-developed.  HENT:     Head: Normocephalic and atraumatic.  Eyes:     Conjunctiva/sclera: Conjunctivae normal.     Pupils: Pupils are equal, round, and reactive to light.  Cardiovascular:     Heart sounds: Normal heart sounds.  Pulmonary:     Effort: Pulmonary effort is normal.  Abdominal:     Palpations: Abdomen is soft.  Musculoskeletal:        General: Normal range of motion.     Cervical back: Normal range of motion.  Skin:    General: Skin is warm and dry.  Neurological:     General: No focal deficit present.     Mental Status: He is alert.  Psychiatric:        Attention and Perception: Attention normal.        Mood and Affect: Mood normal.        Speech: Speech normal.      Assessment/Plan Treatment today followed up by treatment on Wednesday with daily assessment of improvement to determine when he would be ready for discharge  [name], MD 07/22/2022, 2:22 PM

## 2022-07-22 NOTE — Group Note (Signed)
[facility] LCSW Group Therapy Note    Group Date: 07/22/2022 Start Time: 1310 End Time: 1410  Type of Therapy and Topic:  Group Therapy:  Overcoming Obstacles  Participation Level:  [facility] PARTICIPATION LEVEL: Did Not Attend  Mood:  Description of Group:   In this group patients will be encouraged to explore what they see as obstacles to their own wellness and recovery. They will be guided to discuss their thoughts, feelings, and behaviors related to these obstacles. The group will process together ways to cope with barriers, with attention given to specific choices patients can make. Each patient will be challenged to identify changes they are motivated to make in order to overcome their obstacles. This group will be process-oriented, with patients participating in exploration of their own experiences as well as giving and receiving support and challenge from other group members.  Therapeutic Goals: 1. Patient will identify personal and current obstacles as they relate to admission. 2. Patient will identify barriers that currently interfere with their wellness or overcoming obstacles.  3. Patient will identify feelings, thought process and behaviors related to these barriers. 4. Patient will identify two changes they are willing to make to overcome these obstacles:    Summary of Patient Progress Patient was at ECT.   Therapeutic Modalities:   Cognitive Behavioral Therapy Solution Focused Therapy Motivational Interviewing Relapse Prevention Therapy   Rozann Lesches, LCSW

## 2022-07-22 NOTE — Progress Notes (Deleted)
Writer reached out to chaplain via chaplain contact number. Patient requesting to speak with a Chaplain on grief and loss.

## 2022-07-22 NOTE — Progress Notes (Signed)
Pt is anxious and cooperative. VS are stable compliant to medications. Denies SI/ HI/AVH.Marland Kitchen Communicates his needs very well. Speech is clear. Patient is able to walk with his walker with minimal supervision., Patient ate his snacks,took his medications, and went to his room. Pt slept on his bed most of the night with eyes closed. One to one checks were maintained throughput the shift. No other issues.

## 2022-07-22 NOTE — Anesthesia Procedure Notes (Signed)
Procedure Name: General with mask airway Date/Time: 07/22/2022 12:35 PM  Performed by: Lily Peer, Quentin Strebel, CRNAPre-anesthesia Checklist: Patient identified, Emergency Drugs available, Suction available, Patient being monitored and Timeout performed Patient Re-evaluated:Patient Re-evaluated prior to induction Oxygen Delivery Method: Circle system utilized Preoxygenation: Pre-oxygenation with 100% oxygen Induction Type: IV induction Ventilation: Mask ventilation throughout procedure

## 2022-07-22 NOTE — [facility] Group Notes (Signed)
Patient in the courtyard with group for music and rec activities. Patient engaged, singing, and dancing throughout songs.

## 2022-07-22 NOTE — Progress Notes (Signed)
PT Cancellation Note  Patient Details Name: John [name] MRN: [id number] DOB: 02/20/1941   Cancelled Treatment:    Reason Eval/Treat Not Completed: Other (comment).  Pt just received dinner tray (pt s/p ECT today).  Will re-attempt PT treatment session at a later date/time.  Leitha Bleak, PT 07/22/22, 4:21 PM

## 2022-07-23 ENCOUNTER — Other Ambulatory Visit: Payer: Self-pay | Admitting: Psychiatry

## 2022-07-23 DIAGNOSIS — F332 Major depressive disorder, recurrent severe without psychotic features: Secondary | ICD-10-CM | POA: Diagnosis not present

## 2022-07-23 LAB — GLUCOSE, CAPILLARY
Glucose-Capillary: 98 mg/dL (ref 70–99)
Glucose-Capillary: 110 mg/dL — ABNORMAL HIGH (ref 70–99)
Glucose-Capillary: 88 mg/dL (ref 70–99)
Glucose-Capillary: 171 mg/dL — ABNORMAL HIGH (ref 70–99)

## 2022-07-23 MED ORDER — TRAZODONE HCL 50 MG PO TABS
50.0000 mg | ORAL_TABLET | Freq: Every day | ORAL | Status: DC
  Administered 2022-07-23 – 2022-07-30 (×8): 50 mg via ORAL
  Filled 2022-07-23 (×8): qty 1

## 2022-07-23 NOTE — Group Note (Signed)
[facility] LCSW Group Therapy Note   Group Date: 07/23/2022 Start Time: 1330 End Time: 1430  Type of Therapy/Topic:  Group Therapy:  Feelings about Diagnosis  Participation Level:  None   Description of Group:    This group will allow patients to explore their thoughts and feelings about diagnoses they have received. Patients will be guided to explore their level of understanding and acceptance of these diagnoses. Facilitator will encourage patients to process their thoughts and feelings about the reactions of others to their diagnosis, and will guide patients in identifying ways to discuss their diagnosis with significant others in their lives. This group will be process-oriented, with patients participating in exploration of their own experiences as well as giving and receiving support and challenge from other group members.   Therapeutic Goals: 1. Patient will demonstrate understanding of diagnosis as evidence by identifying two or more symptoms of the disorder:  2. Patient will be able to express two feelings regarding the diagnosis 3. Patient will demonstrate ability to communicate their needs through discussion and/or role plays  Summary of Patient Progress: Patient was present in group.  Patient did not engage in group discussions.   Therapeutic Modalities:   Cognitive Behavioral Therapy Brief Therapy Feelings Identification    Rozann Lesches, LCSW

## 2022-07-23 NOTE — Progress Notes (Signed)
Patient is calm and cooperative. Denies SI/HI/AVH. Vital signs are stable. No pain. Compliant to medications. Communicates with staff and peers very well. Speech is clear. Patient was in a day room chatting with other patients. Patient's appetite is good. Patient ate snack and was watching TV with other patients. No other issues. Patient went to his room after day room is closed. One to one observation is maintained while patient is asleep. Patient has slept on his bed with eyes closed most of the shift

## 2022-07-23 NOTE — Progress Notes (Signed)
[facility] MD Progress Note  07/23/2022 11:48 AM John [name]  MRN:  [id number] Subjective: John [name] is seen on rounds.  His affect is improving and so is his mood.  He denies any side effects from his medications.  ECT seems to be helping.  He states that he did not sleep very well last night and it looks like he has not taken trazodone for about 10 days from when to go ahead and schedule it.  Principal Problem: Severe recurrent major depression without psychotic features ([place]) Diagnosis: Principal Problem:   Severe recurrent major depression without psychotic features ([place]) Active Problems:   Major depressive disorder, recurrent severe without psychotic features ([place])   Pressure injury of skin  Total Time spent with patient: 15 minutes  Past Psychiatric History:   Patient has a history of past depression repeated episodes throughout his life.  1 prior suicide attempt.  Was admitted to the hospital in mid May here with depression.  Was not showing significant improvement over time and was referred for ECT.  Patient received a few ECT treatments which generally appeared to be tolerated well.  This past weekend however he decompensated for unclear reasons at the very end of Sunday and prior to treatment on Monday was seen to be delirious with an arrhythmia.  Arrhythmia has stabilized.  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 06/2015 CT Head: abnl thickening of mid-dist R cervical ICA w/o narrowing - ? vasculitis and thrombosed/healing dissection; b. 01/2021 Carotid U/S: 1-39% bilat ICA stenoses.   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 Age 34 - struck in head w/ baseball --> s/p craniotomy.   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 Approx 2015 - eval in [place] - conservatively managed.    Past Surgical History:  Procedure Laterality Date   ANKLE ARTHROSCOPY Left    fracture   CHOLECYSTECTOMY  2007   [place]   left frontal craniotomy for epidural hematoma   HIP ARTHROPLASTY Right 04/07/2022   Procedure: ARTHROPLASTY BIPOLAR HIP (HEMIARTHROPLASTY);  Surgeon: Earnestine Leys, MD;  Location: [facility] ORS;  Service: Orthopedics;  Laterality: Right;   INSERTION OF MESH  01/02/2022   Procedure: INSERTION OF MESH;  Surgeon: Herbert Pun, MD;  Location: [facility] ORS;  Service: General;;   LIGAMENT REPAIR Right    thumb   RETINAL DETACHMENT SURGERY     TREATMENT FISTULA ANAL  2006   Family History:  Family History  Problem Relation Age of Onset   Osteoporosis Mother    Depression Mother    Prostate cancer Father    Hypertension Father    Diabetes Brother    Kidney disease Neg Hx     Social History:  Social History   Substance and Sexual Activity  Alcohol Use Not Currently  Social History   Substance and Sexual Activity  Drug Use No    Social History   Socioeconomic History   Marital status: Married    Spouse name: [name]   Number of children: 2   Years of education: Not on file   Highest education level: Master's degree (e.g., MA, MS, MEng, MEd, MSW, MBA)  Occupational History   Not on file  Tobacco Use   Smoking status: Never   Smokeless tobacco: Never  Vaping Use   Vaping Use: Never used  Substance and Sexual Activity   Alcohol use: Not Currently   Drug use: No   Sexual activity: Yes    Birth control/protection: None  Other Topics Concern   Not on file  Social History Narrative   Live at home with wife.    Social Determinants of Health    Financial Resource Strain: Not on file  Food Insecurity: Food Insecurity Present (07/09/2022)   Hunger Vital Sign    Worried About Running Out of Food in the Last Year: Sometimes true    Ran Out of Food in the Last Year: Sometimes true  Transportation Needs: No Transportation Needs (07/09/2022)   PRAPARE - Hydrologist (Medical): No    Lack of Transportation (Non-Medical): No  Physical Activity: Not on file  Stress: Not on file  Social Connections: Not on file   Additional Social History:                         Sleep: Poor  Appetite:  Good  Current Medications: Current Facility-Administered Medications  Medication Dose Route Frequency Provider Last Rate Last Admin   acetaminophen (TYLENOL) tablet 650 mg  650 mg Oral Q6H PRN Patrecia Pour, NP   650 mg at 07/20/22 0902   alum & mag hydroxide-simeth (MAALOX/MYLANTA) 200-200-20 MG/5ML suspension 30 mL  30 mL Oral Q4H PRN Patrecia Pour, NP       ascorbic acid (VITAMIN C) tablet 500 mg  500 mg Oral BID Parks Ranger, DO   500 mg at 07/23/22 4098   aspirin EC tablet 81 mg  81 mg Oral Daily Patrecia Pour, NP   81 mg at 07/23/22 0925   buPROPion (WELLBUTRIN XL) 24 hr tablet 300 mg  300 mg Oral Daily Patrecia Pour, NP   300 mg at 07/23/22 1191   docusate sodium (COLACE) capsule 100 mg  100 mg Oral BID Patrecia Pour, NP   100 mg at 07/23/22 0926   insulin aspart (novoLOG) injection 0-6 Units  0-6 Units Subcutaneous TID WC Parks Ranger, DO   3 Units at 07/22/22 1634   liver oil-zinc oxide (DESITIN) 40 % ointment   Topical BID Parks Ranger, DO   Given at 07/23/22 0930   magnesium hydroxide (MILK OF MAGNESIA) suspension 30 mL  30 mL Oral Daily PRN Patrecia Pour, NP       methocarbamol (ROBAXIN) tablet 500 mg  500 mg Oral Q6H PRN Patrecia Pour, NP       metoprolol tartrate (LOPRESSOR) tablet 12.5 mg  12.5 mg Oral BID Parks Ranger, DO   12.5 mg at  07/22/22 2145   modafinil (PROVIGIL) tablet 100 mg  100 mg Oral QPC breakfast Parks Ranger, DO   100 mg at 07/23/22 4782   multivitamin with minerals tablet 1 tablet  1 tablet Oral Daily Parks Ranger, DO   1 tablet  at 07/23/22 0927   OLANZapine (ZYPREXA) tablet 10 mg  10 mg Oral QHS Parks Ranger, DO   10 mg at 07/22/22 2147   polyethylene glycol (MIRALAX / GLYCOLAX) packet 17 g  17 g Oral Daily PRN Patrecia Pour, NP       protein supplement (ENSURE MAX) liquid  11 oz Oral BID Parks Ranger, DO   11 oz at 07/23/22 0930   senna (SENOKOT) tablet 8.6 mg  1 tablet Oral BID Patrecia Pour, NP   8.6 mg at 07/23/22 0865   sertraline (ZOLOFT) tablet 50 mg  50 mg Oral QHS Patrecia Pour, NP   50 mg at 07/22/22 2147   traZODone (DESYREL) tablet 50 mg  50 mg Oral QHS PRN Patrecia Pour, NP   50 mg at 07/12/22 2127    Lab Results:  Results for orders placed or performed during the hospital encounter of 07/04/22 (from the past 48 hour(s))  Glucose, capillary     Status: Abnormal   Collection Time: 07/21/22  4:08 PM  Result Value Ref Range   Glucose-Capillary 154 (H) 70 - 99 mg/dL    Comment: Glucose reference range applies only to samples taken after fasting for at least 8 hours.  Glucose, capillary     Status: None   Collection Time: 07/21/22  7:54 PM  Result Value Ref Range   Glucose-Capillary 82 70 - 99 mg/dL    Comment: Glucose reference range applies only to samples taken after fasting for at least 8 hours.  Glucose, capillary     Status: Abnormal   Collection Time: 07/22/22  7:27 AM  Result Value Ref Range   Glucose-Capillary 101 (H) 70 - 99 mg/dL    Comment: Glucose reference range applies only to samples taken after fasting for at least 8 hours.  Glucose, capillary     Status: Abnormal   Collection Time: 07/22/22 11:40 AM  Result Value Ref Range   Glucose-Capillary 117 (H) 70 - 99 mg/dL    Comment: Glucose reference range applies only to  samples taken after fasting for at least 8 hours.  Glucose, capillary     Status: Abnormal   Collection Time: 07/22/22  2:16 PM  Result Value Ref Range   Glucose-Capillary 119 (H) 70 - 99 mg/dL    Comment: Glucose reference range applies only to samples taken after fasting for at least 8 hours.  Glucose, capillary     Status: Abnormal   Collection Time: 07/22/22  4:17 PM  Result Value Ref Range   Glucose-Capillary 283 (H) 70 - 99 mg/dL    Comment: Glucose reference range applies only to samples taken after fasting for at least 8 hours.  Glucose, capillary     Status: None   Collection Time: 07/22/22  8:07 PM  Result Value Ref Range   Glucose-Capillary 80 70 - 99 mg/dL    Comment: Glucose reference range applies only to samples taken after fasting for at least 8 hours.   Comment 1 Notify RN   Glucose, capillary     Status: None   Collection Time: 07/23/22  7:53 AM  Result Value Ref Range   Glucose-Capillary 98 70 - 99 mg/dL    Comment: Glucose reference range applies only to samples taken after fasting for at least 8 hours.  Glucose, capillary     Status: Abnormal   Collection Time: 07/23/22 11:25 AM  Result Value Ref Range   Glucose-Capillary 110 (H) 70 - 99 mg/dL  Comment: Glucose reference range applies only to samples taken after fasting for at least 8 hours.    Blood Alcohol level:  Lab Results  Component Value Date   ETH <10 07/02/2022   ETH <10 85/63/1497    Metabolic Disorder Labs: Lab Results  Component Value Date   HGBA1C 5.6 03/19/2022   MPG 114.02 03/19/2022   No results found for: "PROLACTIN" Lab Results  Component Value Date   CHOL 131 03/19/2022   TRIG 92 03/19/2022   HDL 33 (L) 03/19/2022   CHOLHDL 4.0 03/19/2022   VLDL 18 03/19/2022   LDLCALC 80 03/19/2022    Physical Findings: AIMS:  , ,  ,  ,    CIWA:    COWS:     Musculoskeletal: Strength & Muscle Tone: within normal limits Gait & Station: normal Patient leans: N/A  Psychiatric  Specialty Exam:  Presentation  General Appearance: Appropriate for Environment; Casual  Eye Contact:Fleeting  Speech:Garbled  Speech Volume:Decreased  Handedness:Right   Mood and Affect  Mood:Anxious; Depressed  Affect:Flat   Thought Process  Thought Processes:Linear; Coherent; Goal Directed  Descriptions of Associations:Loose  Orientation:Partial  Thought Content:Scattered  History of Schizophrenia/Schizoaffective disorder:No  Duration of Psychotic Symptoms:No data recorded Hallucinations:No data recorded Ideas of Reference:None  Suicidal Thoughts:No data recorded Homicidal Thoughts:No data recorded  Sensorium  Memory:Recent Poor; Remote Poor  Judgment:Poor  Insight:Fair   Executive Functions  Concentration:Fair  Attention Span:Poor  Recall:Poor  Fund of Knowledge:Poor  Language:Poor   Psychomotor Activity  Psychomotor Activity:No data recorded  Assets  Assets:Financial Resources/Insurance   Sleep  Sleep:No data recorded   Physical Exam: Physical Exam Vitals and nursing note reviewed.  Constitutional:      Appearance: Normal appearance. He is normal weight.  Neurological:     General: No focal deficit present.     Mental Status: He is alert and oriented to person, place, and time.  Psychiatric:        Attention and Perception: Attention and perception normal.        Mood and Affect: Mood is depressed. Affect is flat.        Speech: Speech normal.        Behavior: Behavior normal. Behavior is cooperative.        Thought Content: Thought content normal.        Cognition and Memory: Cognition is impaired. Memory is impaired.        Judgment: Judgment normal.    Review of Systems  Constitutional: Negative.   HENT: Negative.    Eyes: Negative.   Respiratory: Negative.    Cardiovascular: Negative.   Gastrointestinal: Negative.   Genitourinary: Negative.   Musculoskeletal: Negative.   Skin: Negative.   Neurological: Negative.    Endo/Heme/Allergies: Negative.   Psychiatric/Behavioral:  Positive for depression.    Blood pressure 136/81, pulse (!) 54, temperature 98.5 F (36.9 C), temperature source Oral, resp. rate 18, height '6\' 2"'$  (1.88 m), weight 70.8 kg, SpO2 100 %. Body mass index is 20.03 kg/m.   Treatment Plan Summary: Daily contact with patient to assess and evaluate symptoms and progress in treatment, Medication management, and Plan to schedule trazodone 50 mg at bedtime.  Continue ECT and current medications.  Parks Ranger, DO 07/23/2022, 11:48 AM

## 2022-07-23 NOTE — Plan of Care (Signed)
  Problem: Education: Goal: Knowledge of General Education information will improve Description: Including pain rating scale, medication(s)/side effects and non-pharmacologic comfort measures Outcome: Progressing   Problem: Health Behavior/Discharge Planning: Goal: Ability to manage health-related needs will improve Outcome: Progressing   Problem: Clinical Measurements: Goal: Ability to maintain clinical measurements within normal limits will improve Outcome: Progressing Goal: Will remain free from infection Outcome: Progressing Goal: Diagnostic test results will improve Outcome: Progressing Goal: Respiratory complications will improve Outcome: Progressing Goal: Cardiovascular complication will be avoided Outcome: Progressing   Problem: Nutrition: Goal: Adequate nutrition will be maintained Outcome: Progressing   Problem: Coping: Goal: Level of anxiety will decrease Outcome: Progressing   Problem: Elimination: Goal: Will not experience complications related to bowel motility Outcome: Progressing Goal: Will not experience complications related to urinary retention Outcome: Progressing   Problem: Education: Goal: Knowledge of [facility] Education information/materials will improve Outcome: Progressing Goal: Emotional status will improve Outcome: Progressing Goal: Mental status will improve Outcome: Progressing Goal: Verbalization of understanding the information provided will improve Outcome: Progressing   Problem: Activity: Goal: Interest or engagement in activities will improve Outcome: Progressing Goal: Sleeping patterns will improve Outcome: Progressing   Problem: Education: Goal: Utilization of techniques to improve thought processes will improve Outcome: Progressing Goal: Knowledge of the prescribed therapeutic regimen will improve Outcome: Progressing   Problem: Safety: Goal: Periods of time without injury will increase Outcome: Progressing    Problem: Activity: Goal: Interest or engagement in leisure activities will improve Outcome: Progressing Goal: Imbalance in normal sleep/wake cycle will improve Outcome: Progressing   Problem: Metabolic: Goal: Ability to maintain appropriate glucose levels will improve Outcome: Progressing   Problem: Health Behavior/Discharge Planning: Goal: Ability to identify and utilize available resources and services will improve Outcome: Progressing   Problem: Fluid Volume: Goal: Ability to maintain a balanced intake and output will improve Outcome: Progressing

## 2022-07-23 NOTE — Progress Notes (Signed)
Patient is alert and oriented times 2. Mood and affect sad and sullen. Patient rates pain as 0/10. He denies SI, HI, and AVH. Patient does endorse feelings of anxiety and depression at this time. Patient states he slept well last night. Medicines administered whole by mouth without difficulty. Patient ate breakfast in day room; appetite good. Patient remains on unit with Q15 minute checks in place.

## 2022-07-23 NOTE — Progress Notes (Signed)
Physical Therapy Treatment Patient Details Name: John [name] MRN: [id number] DOB: 1941-07-07 Today's Date: 07/23/2022   History of Present Illness 81 y/o M who was voluntarily admitted to geriatric psychiatry for worsening depression. Pt was d/c from the unit ~2 months ago & was noncompliatn with medications. PMH: CA, depression, DM2, epidural hematoma s/p craniotomy (age 59), Horner syndrome, HTN, migraine, obesity, sleep apnea.    PT Comments    Pt walking out of bathroom (without AD use) upon PT arrival but RW just outside of bathroom and pt utilized RW as soon as he came out of bathroom.  Nursing reports pt has been walking with walker on his own in hallway and in his room.  Performed Tinetti balance assessment (no RW use) and pt scored 20/28 (score of 19-23 indicating moderate risk of falls--d/t this, educated pt to continue using RW for balance/safety with ambulation--pt agreeable).  Pt tolerated standing LE ex's well with UE support on RW (of note R hip did appear weaker than L hip in general).  Pt appears safe ambulating with RW during session.  Will continue to focus on hip strengthening and higher level balance during hospitalization.  Recommend OP PT upon hospital discharge.    Recommendations for follow up therapy are one component of a multi-disciplinary discharge planning process, led by the attending physician.  Recommendations may be updated based on patient status, additional functional criteria and insurance authorization.  Follow Up Recommendations  Outpatient PT     Assistance Recommended at Discharge Intermittent Supervision/Assistance  Patient can return home with the following A little help with walking and/or transfers;A little help with bathing/dressing/bathroom;Assistance with cooking/housework;Direct supervision/assist for financial management;Assist for transportation;Help with stairs or ramp for entrance;Direct supervision/assist for medications management   Equipment  Recommendations  Rolling walker (2 wheels)    Recommendations for Other Services       Precautions / Restrictions Precautions Precautions: Fall Restrictions Weight Bearing Restrictions: No     Mobility  Bed Mobility               General bed mobility comments: Deferred    Transfers Overall transfer level: Modified independent Equipment used: Rolling walker (2 wheels) Transfers: Sit to/from Stand Sit to Stand: Modified independent (Device/Increase time)           General transfer comment: steady safe transfers with RW (modified independent); SBA without UE or RW support    Ambulation/Gait Ambulation/Gait assistance: Modified independent (Device/Increase time), Supervision Gait Distance (Feet):  (320 feet no AD use; 100 feet with RW use) Assistive device: None, Rolling walker (2 wheels) Gait Pattern/deviations: Step-through pattern       General Gait Details: occasional altered stepping pattern/unsteadiness without UE support; steady ambulating with RW use   Stairs             Wheelchair Mobility    Modified Rankin (Stroke Patients Only)       Balance Overall balance assessment: Needs assistance Sitting-balance support: No upper extremity supported, Feet supported Sitting balance-Leahy Scale: Normal Sitting balance - Comments: steady sitting reaching outside BOS   Standing balance support: No upper extremity supported Standing balance-Leahy Scale: Good Standing balance comment: no overt loss of balance with ambulation (without UE support)     No loss of balance with ambulation using RW (with head turns R/L/up/down, increasing/decreasing speed, or with turns during session).  TINETTI BALANCE ASSESSMENT TOOL (no AD/DME use) BALANCE SECTION Sitting Balance:     1/1  Rises from Chair:   2/2  Attempts to Rise:   1/2  Standing Balance (1st 5 seconds) 2/2  Standing balance:   2/2 Nudged:    0/2 Eyes Closed:    0/1 Turning 360  degrees  1/1      1/1 Sitting down (getting seated):    1/2  Balance Score:    11/16  GAIT SECTION Indication of Gait:   1/1 Step Length and Height  2/2 Foot Clearance:   2/2 Step Symmetry:   1/1 Step Continuity:   1/1 Path:     1/2 Trunk:     1/2 Walking Time:   0/1 Gait Score:    9/12  Combined Score:   20/28                          Cognition Arousal/Alertness: Awake/alert Behavior During Therapy: WFL for tasks assessed/performed Overall Cognitive Status: Within Functional Limits for tasks assessed                                          Exercises General Exercises - Lower Extremity Hip ABduction/ADduction: AROM, Strengthening, Both, 10 reps, Standing (B UE support on RW) Hip Flexion/Marching: AROM, Strengthening, Both, 10 reps, Standing (B UE support on RW) Heel Raises: AROM, Strengthening, Both, 10 reps, Standing (B UE support on RW) Other Exercises Other Exercises: Standing hamstring curls x10 reps R and then L LE with B UE support on walker    General Comments  Nursing cleared pt for participation in physical therapy.  Pt agreeable to PT session.      Pertinent Vitals/Pain Pain Assessment Pain Assessment: No/denies pain Pain Intervention(s): Limited activity within patient's tolerance, Monitored during session, Repositioned Vitals (HR and O2 on room air) stable and WFL throughout treatment session.    Home Living                          Prior Function            PT Goals (current goals can now be found in the care plan section) Acute Rehab PT Goals Patient Stated Goal: improve hip strength and balance PT Goal Formulation: With patient Time For Goal Achievement: 08/02/22 Progress towards PT goals: Progressing toward goals    Frequency    Min 2X/week      PT Plan Discharge plan needs to be updated (to OP PT)    Co-evaluation              AM-PAC PT "6 Clicks" Mobility   Outcome Measure  Help  needed turning from your back to your side while in a flat bed without using bedrails?: None Help needed moving from lying on your back to sitting on the side of a flat bed without using bedrails?: None Help needed moving to and from a bed to a chair (including a wheelchair)?: None Help needed standing up from a chair using your arms (e.g., wheelchair or bedside chair)?: None Help needed to walk in hospital room?: A Little Help needed climbing 3-5 steps with a railing? : A Little 6 Click Score: 22    End of Session Equipment Utilized During Treatment: Gait belt Activity Tolerance: Patient tolerated treatment well Patient left: in chair (in pt's room with RW in reach) Nurse Communication: Mobility status;Precautions PT Visit Diagnosis: Unsteadiness on feet (R26.81);Muscle weakness (generalized) (M62.81)  Time: 5035-4656 PT Time Calculation (min) (ACUTE ONLY): 23 min  Charges:  $Therapeutic Exercise: 8-22 mins $Therapeutic Activity: 8-22 mins                     Leitha Bleak, PT 07/23/22, 4:31 PM

## 2022-07-24 ENCOUNTER — Inpatient Hospital Stay: Payer: Medicare Other | Admitting: Anesthesiology

## 2022-07-24 ENCOUNTER — Encounter: Payer: Self-pay | Admitting: Psychiatry

## 2022-07-24 ENCOUNTER — Ambulatory Visit: Payer: Medicare Other

## 2022-07-24 DIAGNOSIS — F332 Major depressive disorder, recurrent severe without psychotic features: Secondary | ICD-10-CM | POA: Diagnosis not present

## 2022-07-24 LAB — GLUCOSE, CAPILLARY
Glucose-Capillary: 96 mg/dL (ref 70–99)
Glucose-Capillary: 98 mg/dL (ref 70–99)
Glucose-Capillary: 179 mg/dL — ABNORMAL HIGH (ref 70–99)
Glucose-Capillary: 145 mg/dL — ABNORMAL HIGH (ref 70–99)

## 2022-07-24 MED ORDER — SODIUM CHLORIDE 0.9 % IV SOLN: 500.0000 mL | Freq: Once | INTRAVENOUS | Status: DC

## 2022-07-24 MED ORDER — SODIUM CHLORIDE 0.9 % IV SOLN: 500.0000 mL | Freq: Once | INTRAVENOUS | Status: AC

## 2022-07-24 MED ORDER — MIDAZOLAM HCL 2 MG/2ML IJ SOLN: 2.0000 mg | Freq: Once | INTRAMUSCULAR | Status: DC

## 2022-07-24 MED ORDER — GLYCOPYRROLATE 0.2 MG/ML IJ SOLN: 0.1000 mg | Freq: Once | INTRAMUSCULAR | Status: AC

## 2022-07-24 MED ORDER — KETOROLAC TROMETHAMINE 30 MG/ML IJ SOLN: 30.0000 mg | Freq: Once | INTRAMUSCULAR | Status: AC

## 2022-07-24 MED ORDER — KETAMINE HCL 50 MG/5ML IJ SOSY
PREFILLED_SYRINGE | INTRAMUSCULAR | Status: AC
  Filled 2022-07-24: qty 5

## 2022-07-24 MED ORDER — KETAMINE HCL 10 MG/ML IJ SOLN
INTRAMUSCULAR | Status: DC | PRN
  Administered 2022-07-24: 100 mg via INTRAVENOUS

## 2022-07-24 MED ORDER — MIDAZOLAM HCL 5 MG/5ML IJ SOLN
INTRAMUSCULAR | Status: DC | PRN
  Administered 2022-07-24: 2 mg via INTRAVENOUS

## 2022-07-24 MED ORDER — LABETALOL HCL 5 MG/ML IV SOLN
INTRAVENOUS | Status: DC | PRN
  Administered 2022-07-24: 10 mg via INTRAVENOUS

## 2022-07-24 MED ORDER — MIDAZOLAM HCL 2 MG/2ML IJ SOLN
INTRAMUSCULAR | Status: AC
  Filled 2022-07-24: qty 2

## 2022-07-24 MED ORDER — KETOROLAC TROMETHAMINE 30 MG/ML IJ SOLN
INTRAMUSCULAR | Status: AC
  Administered 2022-07-24: 30 mg via INTRAVENOUS
  Filled 2022-07-24: qty 1

## 2022-07-24 MED ORDER — KETOROLAC TROMETHAMINE 30 MG/ML IJ SOLN: 30.0000 mg | Freq: Once | INTRAMUSCULAR | Status: DC

## 2022-07-24 MED ORDER — GLYCOPYRROLATE 0.2 MG/ML IJ SOLN
0.1000 mg | Freq: Once | INTRAMUSCULAR | Status: DC
  Filled 2022-07-24: qty 0.5

## 2022-07-24 MED ORDER — SUCCINYLCHOLINE CHLORIDE 200 MG/10ML IV SOSY
PREFILLED_SYRINGE | INTRAVENOUS | Status: DC | PRN
  Administered 2022-07-24: 100 mg via INTRAVENOUS

## 2022-07-24 MED ORDER — GLYCOPYRROLATE 0.2 MG/ML IJ SOLN
0.1000 mg | Freq: Once | INTRAMUSCULAR | Status: AC
  Administered 2022-07-26: 0.1 mg via INTRAVENOUS
  Filled 2022-07-24: qty 0.5

## 2022-07-24 MED ORDER — GLYCOPYRROLATE 0.2 MG/ML IJ SOLN
INTRAMUSCULAR | Status: AC
  Administered 2022-07-24: 0.1 mg via INTRAVENOUS
  Filled 2022-07-24: qty 1

## 2022-07-24 NOTE — H&P (Signed)
John [name] is an 81 y.o. male.   Chief Complaint: Patient is feeling much better.  Denies being depressed.  Much more energetic. 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 06/2015 CT Head: abnl thickening of mid-dist R cervical ICA w/o narrowing - ? vasculitis and thrombosed/healing dissection; b. 01/2021 Carotid U/S: 1-39% bilat ICA stenoses.   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 Age 68 - struck in head w/ baseball --> s/p craniotomy.   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 Approx 2015 - eval in [place] - conservatively managed.    Past Surgical History:  Procedure Laterality Date   ANKLE ARTHROSCOPY Left    fracture   CHOLECYSTECTOMY  2007   [place]   left frontal craniotomy for epidural hematoma   HIP ARTHROPLASTY Right 04/07/2022   Procedure: ARTHROPLASTY BIPOLAR HIP (HEMIARTHROPLASTY);  Surgeon: Earnestine Leys, MD;  Location: [facility] ORS;  Service: Orthopedics;  Laterality: Right;   INSERTION OF MESH  01/02/2022   Procedure: INSERTION OF MESH;  Surgeon: Herbert Pun, MD;  Location: [facility] ORS;  Service: General;;   LIGAMENT REPAIR Right    thumb   RETINAL DETACHMENT SURGERY     TREATMENT FISTULA ANAL  2006    Family History  Problem Relation Age of Onset   Osteoporosis Mother    Depression Mother    Prostate cancer Father    Hypertension Father    Diabetes Brother    Kidney disease Neg Hx    Social History:  reports that he has never smoked.  He has never used smokeless tobacco. He reports that he does not currently use alcohol. He reports that he does not use drugs.  Allergies: No Known Allergies  Medications Prior to Admission  Medication Sig Dispense Refill   aspirin EC 81 MG tablet Take 81 mg by mouth daily.     buPROPion (WELLBUTRIN XL) 300 MG 24 hr tablet Take 1 tablet by mouth daily.     docusate sodium (COLACE) 100 MG capsule Take 1 capsule (100 mg total) by mouth 2 (two) times daily. 10 capsule 0   methocarbamol (ROBAXIN) 500 MG tablet Take 1 tablet (500 mg total) by mouth every 6 (six) hours as needed for muscle spasms. 30 tablet 0   metoprolol tartrate (LOPRESSOR) 25 MG tablet Take 1 tablet (25 mg total) by mouth 2 (two) times daily. 60 tablet 3   polyethylene glycol (MIRALAX / GLYCOLAX) 17 g packet Take 17 g by mouth daily as needed. 14 each 0   senna (SENOKOT) 8.6 MG TABS tablet Take 1 tablet (8.6 mg total) by mouth 2 (two) times daily. 120 tablet 0   sertraline (ZOLOFT) 50 MG tablet Take 1 tablet (50 mg total) by mouth at bedtime. 30 tablet 0   sertraline (ZOLOFT)  50 MG tablet Take 1 tablet (50 mg total) by mouth daily. 30 tablet 1   tamsulosin (FLOMAX) 0.4 MG CAPS capsule Take 1 capsule (0.4 mg total) by mouth daily after supper. 30 capsule 3   traZODone (DESYREL) 50 MG tablet Take 1 tablet (50 mg total) by mouth at bedtime as needed for sleep. 30 tablet 3    Results for orders placed or performed during the hospital encounter of 07/04/22 (from the past 48 hour(s))  Glucose, capillary     Status: Abnormal   Collection Time: 07/22/22  4:17 PM  Result Value Ref Range   Glucose-Capillary 283 (H) 70 - 99 mg/dL    Comment: Glucose reference range applies only to samples taken after fasting for at least 8 hours.  Glucose, capillary     Status: None   Collection Time: 07/22/22  8:07 PM  Result Value Ref Range   Glucose-Capillary 80 70 - 99 mg/dL    Comment: Glucose reference range applies only to samples taken after  fasting for at least 8 hours.   Comment 1 Notify RN   Glucose, capillary     Status: None   Collection Time: 07/23/22  7:53 AM  Result Value Ref Range   Glucose-Capillary 98 70 - 99 mg/dL    Comment: Glucose reference range applies only to samples taken after fasting for at least 8 hours.  Glucose, capillary     Status: Abnormal   Collection Time: 07/23/22 11:25 AM  Result Value Ref Range   Glucose-Capillary 110 (H) 70 - 99 mg/dL    Comment: Glucose reference range applies only to samples taken after fasting for at least 8 hours.  Glucose, capillary     Status: None   Collection Time: 07/23/22  4:38 PM  Result Value Ref Range   Glucose-Capillary 88 70 - 99 mg/dL    Comment: Glucose reference range applies only to samples taken after fasting for at least 8 hours.  Glucose, capillary     Status: Abnormal   Collection Time: 07/23/22  7:42 PM  Result Value Ref Range   Glucose-Capillary 171 (H) 70 - 99 mg/dL    Comment: Glucose reference range applies only to samples taken after fasting for at least 8 hours.  Glucose, capillary     Status: None   Collection Time: 07/24/22  7:22 AM  Result Value Ref Range   Glucose-Capillary 96 70 - 99 mg/dL    Comment: Glucose reference range applies only to samples taken after fasting for at least 8 hours.  Glucose, capillary     Status: None   Collection Time: 07/24/22  1:03 PM  Result Value Ref Range   Glucose-Capillary 98 70 - 99 mg/dL    Comment: Glucose reference range applies only to samples taken after fasting for at least 8 hours.   Comment 1 Notify RN    Comment 2 Document in Chart    No results found.  Review of Systems  Constitutional: Negative.   HENT: Negative.    Eyes: Negative.   Respiratory: Negative.    Cardiovascular: Negative.   Gastrointestinal: Negative.   Musculoskeletal: Negative.   Skin: Negative.   Neurological: Negative.   Psychiatric/Behavioral: Negative.      Blood pressure (!) 147/89, pulse 65, temperature  (!) 96.8 F (36 C), resp. rate 14, height '6\' 2"'$  (1.88 m), weight 70.8 kg, SpO2 100 %. Physical Exam Vitals and nursing note reviewed.  Constitutional:      Appearance: He is well-developed.  HENT:  Head: Normocephalic and atraumatic.  Eyes:     Conjunctiva/sclera: Conjunctivae normal.     Pupils: Pupils are equal, round, and reactive to light.  Cardiovascular:     Heart sounds: Normal heart sounds.  Pulmonary:     Effort: Pulmonary effort is normal.  Abdominal:     Palpations: Abdomen is soft.  Musculoskeletal:        General: Normal range of motion.     Cervical back: Normal range of motion.  Skin:    General: Skin is warm and dry.  Neurological:     General: No focal deficit present.     Mental Status: He is alert.  Psychiatric:        Mood and Affect: Mood normal.      Assessment/Plan Still showing consistent improvement with each treatment so he is on schedule for Friday.  Once we are done with this index course we will recommend maintenance  [name], MD 07/24/2022, 3:08 PM

## 2022-07-24 NOTE — [facility] Suicide Risk Assessment (Signed)
CSW spoke with the patient's wife, John [name], [phone].    CSW updated that appointment with [facility] on 07/29/2022 will need to be canceled as the patient will not be discharged due to receiving ECT.  Wife reports that the family was hoping that Friday 07/25/2022 would be the last ECT treatment. CSW updated that she would make the family aware but to [place] knowledge the patient remains on the calendar for the treatment.  Wife did not provide confirmation on the cancellation of the appointment on 07/29/2022, stating that she needed to talk to the family.  Wife reports that she has concerns about assistance in the home at discharge.  CSW reviewed that home health PT/OT has been established with [facility].  CSW also updated that home health nursing will be established once the physician has put in orders for it and that this will likely be picked up by [facility] who is doing home health PT and OT.  CSW stated that she thought that this has been reviewed with the family and apologized if it has not.  CSW followed up any additional assistance and wife was unable to identify any.  CSW followed up on patient's therapist and was informed that patient is no longer seeing a therapist so a new referral will have to be made.  Wife reiterated again that she has been unhappy with the services provided to her husband.  Assunta Curtis, MSW, LCSW 07/24/2022 2:54 PM

## 2022-07-24 NOTE — Anesthesia Postprocedure Evaluation (Signed)
Anesthesia Post Note  Patient: John [name]  Procedure(s) Performed: ECT TX  Patient location during evaluation: PACU Anesthesia Type: General Level of consciousness: awake and alert Pain management: pain level controlled Vital Signs Assessment: post-procedure vital signs reviewed and stable Respiratory status: spontaneous breathing, nonlabored ventilation and respiratory function stable Cardiovascular status: blood pressure returned to baseline and stable Postop Assessment: no apparent nausea or vomiting Anesthetic complications: no   No notable events documented.   Last Vitals:  Vitals:   07/24/22 1310 07/24/22 1315  BP: (!) 147/89   Pulse: 68 65  Resp: 12 14  Temp:    SpO2: 99% 100%    Last Pain:  Vitals:   07/24/22 1315  TempSrc:   PainSc: 0-No pain                 [place] Ouch

## 2022-07-24 NOTE — Procedures (Signed)
ECT SERVICES Physician's Interval Evaluation & Treatment Note  Patient Identification: John [name] MRN:  [id number] Date of Evaluation:  07/24/2022 TX #: 4  MADRS:   MMSE:   P.E. Findings:  No change to physical exam  Psychiatric Interval Note:  Significantly brighter and more upbeat  Subjective:  Patient is a 81 y.o. male seen for evaluation for Electroconvulsive Therapy. Feeling good and not depressed  Treatment Summary:   '[x]'$   Right Unilateral             '[]'$  Bilateral   % Energy : 0.3 ms 100%   Impedance: 2020 ohms  Seizure Energy Index: 4051 V squared  Postictal Suppression Index: 75%  Seizure Concordance Index: 95%  Medications  Pre Shock: Robinul 0.1 mg labetalol 10 mg ketamine 100 mg succinylcholine 100 mg  Post Shock: Versed 2 mg  Seizure Duration: 25 seconds EMG 47 seconds EEG   Comments: Well-tolerated.  Presume treatment on Friday again as he is still on the upswing.  Lungs:  '[x]'$   Clear to auscultation               '[]'$  Other:   Heart:    '[x]'$   Regular rhythm             '[]'$  irregular rhythm    '[x]'$   Previous H&P reviewed, patient examined and there are NO CHANGES                 '[]'$   Previous H&P reviewed, patient examined and there are changes noted.   [name], MD 9/27/20233:15 PM

## 2022-07-24 NOTE — Anesthesia Preprocedure Evaluation (Addendum)
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 Left ventricular ejection fraction, by estimation, is >55%. The left ventricle has normal function. Left ventricular endocardial border not optimally defined to evaluate regional wall motion. There is moderate left ventricular hypertrophy. Left ventricular diastolic parameters are consistent with Grade I diastolic dysfunction (impaired relaxation).  2. Right ventricular systolic function is normal. The right ventricular size is normal. Mildly increased right ventricular wall thickness.  3. The mitral valve is grossly normal. No evidence of mitral valve regurgitation. No evidence of mitral stenosis.  4. The aortic valve was not well visualized. Aortic valve regurgitation is not visualized. No aortic stenosis is present.  5. Aortic dilatation noted. 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   ECG 05/17/22:  Sinus rhythm 1st degree AV block  Left anterior fascicular block  Nonspecific ST and T wave abnormality   Neuro/Psych  Headaches, PSYCHIATRIC DISORDERS Depression HOH; hx SDH 2015; hx epidural hematoma age 46 s/p craniotomy  Neuromuscular disease (diabetic polyneuropathy)    GI/Hepatic negative GI ROS,   Endo/Other   diabetes, Type 2  Renal/GU negative Renal ROS     Musculoskeletal   Abdominal   Peds  Hematology negative hematology ROS (+)   Anesthesia Other Findings Cardiology note 05/17/22:  #Paroxysmal atrial fibrillation Found her hospitalization on 04/01/2022. Suspected due to possible serotonin syndrome. ECG on today reveals sinus rhythm with a first-degree AV block and a heart rate of 61 bpm. The patient has a CHA2DS2-VASc score of 4. Anticoagulation contraindicated at this time due to patient's history of subdural hematoma. His echocardiogram from 03/2021 revealed normal LV systolic function with estimated EF of >55%, no regional wall motion abnormalities, grade 1 diastolic dysfunction, mildly decreased RV wall thickness and moderate dilatation of the ascending aorta. -Place 7-day Holter monitor for further evaluation of atrial fibrillation burden. -Continue metoprolol titrate 25 mg by mouth twice daily. -Continue aspirin therapy. - -Counseling discussion included heart healthy lifestyle inclusive of avoidance of tobacco products, weight control, blood pressure and blood sugar control, and regular exercise regimen to include daily aerobic exercise for least 150 minutes per week, as tolerated, at a moderate intensity. - -Recommend decreasing caffeine intake, avoiding the consumption of energy drinks, staying adequately hydrated with water and electrolytes daily and practicing healthy anxiety management.  #Hypertension Chronic, well controlled. - metoprolol as above.  - -Recommend following DASH diet and monitoring blood pressure daily at home.   #HLD Chronic, controlled by diet. - -Counseling discussion included heart healthy lifestyle inclusive of avoidance of tobacco products, weight control, blood pressure and blood sugar control, and regular exercise regimen to include daily aerobic exercise for least 150 minutes per week, as tolerated, at a moderate intensity.  # h/o carotid artery  dissection # h/o subdural hematoma -Management per neurology, vascular and PCP.  #  DM type II, non-insulin dependent Chronic, reasonably controlled.  - management per PCP.   Reproductive/Obstetrics                             Anesthesia Physical  Anesthesia Plan  ASA: 3  Anesthesia Plan: General   Post-op Pain Management:    Induction: Intravenous  PONV Risk Score and Plan: 3 and TIVA and Treatment may vary due to age or medical condition  Airway Management Planned: Natural Airway  Additional Equipment:   Intra-op Plan:   Post-operative Plan:   Informed Consent: I have reviewed the patients History and Physical, chart, labs and discussed the procedure including the risks, benefits and alternatives for the proposed anesthesia with the patient or authorized representative who has indicated his/her understanding and acceptance.       Plan Discussed with: CRNA  Anesthesia Plan Comments: (Serial consent on chart.)        Anesthesia Quick Evaluation

## 2022-07-24 NOTE — Progress Notes (Signed)
[facility] MD Progress Note  07/24/2022 10:15 AM John [name]  MRN:  [id number] Subjective: John [name] is seen on rounds.  I scheduled trazodone last night and he did sleep better.  He has been taking his medications as prescribed and denies any side effects.  He has had some bradycardia so I am going to go ahead and stop his metoprolol.  Other than that, his mood and affect are improving.  He has ECT scheduled for today.  I suspect he will have it done on Friday and Monday and then we can see about discharge after that.  Principal Problem: Severe recurrent major depression without psychotic features ([place] John [name]) Diagnosis: Principal Problem:   Severe recurrent major depression without psychotic features ([place]) Active Problems:   Major depressive disorder, recurrent severe without psychotic features ([place])   Pressure injury of skin  Total Time spent with patient: 15 minutes  Past Psychiatric History:  Patient has a history of past depression repeated episodes throughout his life.  1 prior suicide attempt.  Was admitted to the hospital in mid May here with depression.  Was not showing significant improvement over time and was referred for ECT.  Patient received a few ECT treatments which generally appeared to be tolerated well.  This past weekend however he decompensated for unclear reasons at the very end of Sunday and prior to treatment on Monday was seen to be delirious with an arrhythmia.  Arrhythmia has stabilized.  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 06/2015 CT Head: abnl thickening of mid-dist R cervical ICA w/o narrowing - ? vasculitis and thrombosed/healing dissection; b. 01/2021 Carotid U/S: 1-39% bilat ICA stenoses.   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 Age 52 - struck in head w/ baseball --> s/p craniotomy.    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 Approx 2015 - eval in [place] - conservatively managed.    Past Surgical History:  Procedure Laterality Date   ANKLE ARTHROSCOPY Left    fracture   CHOLECYSTECTOMY  2007   [place]   left frontal craniotomy for epidural hematoma   HIP ARTHROPLASTY Right 04/07/2022   Procedure: ARTHROPLASTY BIPOLAR HIP (HEMIARTHROPLASTY);  Surgeon: Earnestine Leys, MD;  Location: [facility] ORS;  Service: Orthopedics;  Laterality: Right;   INSERTION OF MESH  01/02/2022   Procedure: INSERTION OF MESH;  Surgeon: Herbert Pun, MD;  Location: [facility] ORS;  Service: General;;   LIGAMENT REPAIR Right    thumb   RETINAL DETACHMENT SURGERY     TREATMENT FISTULA ANAL  2006   Family History:  Family History  Problem Relation Age of Onset   Osteoporosis Mother    Depression Mother    Prostate cancer Father    Hypertension Father    Diabetes Brother    Kidney disease Neg Hx  Social History:  Social History   Substance and Sexual Activity  Alcohol Use Not Currently     Social History   Substance and Sexual Activity  Drug Use No    Social History   Socioeconomic History   Marital status: Married    Spouse name: [name]   Number of children: 2   Years of education: Not on file   Highest education level: Master's degree (e.g., MA, MS, MEng, MEd, MSW, MBA)  Occupational History   Not on file  Tobacco Use   Smoking status: Never   Smokeless tobacco: Never  Vaping Use   Vaping Use: Never used  Substance and Sexual Activity   Alcohol use: Not Currently   Drug use: No   Sexual activity: Yes    Birth control/protection: None   Other Topics Concern   Not on file  Social History Narrative   Live at home with wife.    Social Determinants of Health   Financial Resource Strain: Not on file  Food Insecurity: Food Insecurity Present (07/09/2022)   Hunger Vital Sign    Worried About Running Out of Food in the Last Year: Sometimes true    Ran Out of Food in the Last Year: Sometimes true  Transportation Needs: No Transportation Needs (07/09/2022)   PRAPARE - Hydrologist (Medical): No    Lack of Transportation (Non-Medical): No  Physical Activity: Not on file  Stress: Not on file  Social Connections: Not on file   Additional Social History:                         Sleep: Good  Appetite:  Good  Current Medications: Current Facility-Administered Medications  Medication Dose Route Frequency Provider Last Rate Last Admin   acetaminophen (TYLENOL) tablet 650 mg  650 mg Oral Q6H PRN Patrecia Pour, NP   650 mg at 07/20/22 0902   alum & mag hydroxide-simeth (MAALOX/MYLANTA) 200-200-20 MG/5ML suspension 30 mL  30 mL Oral Q4H PRN Patrecia Pour, NP       ascorbic acid (VITAMIN C) tablet 500 mg  500 mg Oral BID Parks Ranger, DO   500 mg at 07/23/22 2049   aspirin EC tablet 81 mg  81 mg Oral Daily Patrecia Pour, NP   81 mg at 07/23/22 0925   buPROPion (WELLBUTRIN XL) 24 hr tablet 300 mg  300 mg Oral Daily Patrecia Pour, NP   300 mg at 07/23/22 3545   docusate sodium (COLACE) capsule 100 mg  100 mg Oral BID Patrecia Pour, NP   100 mg at 07/23/22 2054   insulin aspart (novoLOG) injection 0-6 Units  0-6 Units Subcutaneous TID WC Parks Ranger, DO   3 Units at 07/22/22 1634   liver oil-zinc oxide (DESITIN) 40 % ointment   Topical BID Parks Ranger, DO   1 Application at 62/56/38 2053   magnesium hydroxide (MILK OF MAGNESIA) suspension 30 mL  30 mL Oral Daily PRN Patrecia Pour, NP       methocarbamol (ROBAXIN) tablet 500 mg  500 mg Oral Q6H PRN  Patrecia Pour, NP       modafinil (PROVIGIL) tablet 100 mg  100 mg Oral QPC breakfast Parks Ranger, DO   100 mg at 07/23/22 9373   multivitamin with minerals tablet 1 tablet  1 tablet Oral Daily Parks Ranger, DO   1 tablet at 07/23/22 [phone]  OLANZapine (ZYPREXA) tablet 10 mg  10 mg Oral QHS Parks Ranger, DO   10 mg at 07/23/22 2049   polyethylene glycol (MIRALAX / GLYCOLAX) packet 17 g  17 g Oral Daily PRN Patrecia Pour, NP       protein supplement (ENSURE MAX) liquid  11 oz Oral BID Parks Ranger, DO   11 oz at 07/23/22 2055   senna (SENOKOT) tablet 8.6 mg  1 tablet Oral BID Patrecia Pour, NP   8.6 mg at 07/23/22 2049   sertraline (ZOLOFT) tablet 50 mg  50 mg Oral QHS Patrecia Pour, NP   50 mg at 07/23/22 2049   traZODone (DESYREL) tablet 50 mg  50 mg Oral QHS Parks Ranger, DO   50 mg at 07/23/22 2050    Lab Results:  Results for orders placed or performed during the hospital encounter of 07/04/22 (from the past 48 hour(s))  Glucose, capillary     Status: Abnormal   Collection Time: 07/22/22 11:40 AM  Result Value Ref Range   Glucose-Capillary 117 (H) 70 - 99 mg/dL    Comment: Glucose reference range applies only to samples taken after fasting for at least 8 hours.  Glucose, capillary     Status: Abnormal   Collection Time: 07/22/22  2:16 PM  Result Value Ref Range   Glucose-Capillary 119 (H) 70 - 99 mg/dL    Comment: Glucose reference range applies only to samples taken after fasting for at least 8 hours.  Glucose, capillary     Status: Abnormal   Collection Time: 07/22/22  4:17 PM  Result Value Ref Range   Glucose-Capillary 283 (H) 70 - 99 mg/dL    Comment: Glucose reference range applies only to samples taken after fasting for at least 8 hours.  Glucose, capillary     Status: None   Collection Time: 07/22/22  8:07 PM  Result Value Ref Range   Glucose-Capillary 80 70 - 99 mg/dL    Comment: Glucose reference range applies  only to samples taken after fasting for at least 8 hours.   Comment 1 Notify RN   Glucose, capillary     Status: None   Collection Time: 07/23/22  7:53 AM  Result Value Ref Range   Glucose-Capillary 98 70 - 99 mg/dL    Comment: Glucose reference range applies only to samples taken after fasting for at least 8 hours.  Glucose, capillary     Status: Abnormal   Collection Time: 07/23/22 11:25 AM  Result Value Ref Range   Glucose-Capillary 110 (H) 70 - 99 mg/dL    Comment: Glucose reference range applies only to samples taken after fasting for at least 8 hours.  Glucose, capillary     Status: None   Collection Time: 07/23/22  4:38 PM  Result Value Ref Range   Glucose-Capillary 88 70 - 99 mg/dL    Comment: Glucose reference range applies only to samples taken after fasting for at least 8 hours.  Glucose, capillary     Status: Abnormal   Collection Time: 07/23/22  7:42 PM  Result Value Ref Range   Glucose-Capillary 171 (H) 70 - 99 mg/dL    Comment: Glucose reference range applies only to samples taken after fasting for at least 8 hours.  Glucose, capillary     Status: None   Collection Time: 07/24/22  7:22 AM  Result Value Ref Range   Glucose-Capillary 96 70 - 99 mg/dL    Comment: Glucose reference range  applies only to samples taken after fasting for at least 8 hours.    Blood Alcohol level:  Lab Results  Component Value Date   ETH <10 07/02/2022   ETH <10 87/86/7672    Metabolic Disorder Labs: Lab Results  Component Value Date   HGBA1C 5.6 03/19/2022   MPG 114.02 03/19/2022   No results found for: "PROLACTIN" Lab Results  Component Value Date   CHOL 131 03/19/2022   TRIG 92 03/19/2022   HDL 33 (L) 03/19/2022   CHOLHDL 4.0 03/19/2022   VLDL 18 03/19/2022   LDLCALC 80 03/19/2022    Physical Findings: AIMS:  , ,  ,  ,    CIWA:    COWS:     Musculoskeletal: Strength & Muscle Tone: within normal limits Gait & Station: normal Patient leans: N/A  Psychiatric  Specialty Exam:  Presentation  General Appearance: Appropriate for Environment; Casual  Eye Contact:Fleeting  Speech:Garbled  Speech Volume:Decreased  Handedness:Right   Mood and Affect  Mood:Anxious; Depressed  Affect:Flat   Thought Process  Thought Processes:Linear; Coherent; Goal Directed  Descriptions of Associations:Loose  Orientation:Partial  Thought Content:Scattered  History of Schizophrenia/Schizoaffective disorder:No  Duration of Psychotic Symptoms:No data recorded Hallucinations:No data recorded Ideas of Reference:None  Suicidal Thoughts:No data recorded Homicidal Thoughts:No data recorded  Sensorium  Memory:Recent Poor; Remote Poor  Judgment:Poor  Insight:Fair   Executive Functions  Concentration:Fair  Attention Span:Poor  Recall:Poor  Fund of Knowledge:Poor  Language:Poor   Psychomotor Activity  Psychomotor Activity:No data recorded  Assets  Assets:Financial Resources/Insurance   Sleep  Sleep:No data recorded   Physical Exam: Physical Exam Vitals and nursing note reviewed.  Constitutional:      Appearance: Normal appearance. He is normal weight.  Neurological:     General: No focal deficit present.     Mental Status: He is alert and oriented to person, place, and time.  Psychiatric:        Attention and Perception: Attention and perception normal.        Mood and Affect: Mood is depressed. Affect is flat.        Speech: Speech normal.        Behavior: Behavior normal. Behavior is cooperative.        Thought Content: Thought content normal.        Cognition and Memory: Cognition is impaired. Memory is impaired.        Judgment: Judgment normal.    Review of Systems  Constitutional: Negative.   HENT: Negative.    Eyes: Negative.   Respiratory: Negative.    Cardiovascular: Negative.   Gastrointestinal: Negative.   Genitourinary: Negative.   Musculoskeletal: Negative.   Skin: Negative.   Neurological: Negative.    Endo/Heme/Allergies: Negative.   Psychiatric/Behavioral:  Positive for depression.    Blood pressure 120/73, pulse (!) 54, temperature 98.6 F (37 C), temperature source Oral, resp. rate 18, height '6\' 2"'$  (1.88 m), weight 70.8 kg, SpO2 99 %. Body mass index is 20.03 kg/m.   Treatment Plan Summary: Daily contact with patient to assess and evaluate symptoms and progress in treatment, Medication management, and Plan is to discontinue metoprolol.  Continue current medications.  [place], DO 07/24/2022, 10:15 AM

## 2022-07-24 NOTE — Progress Notes (Signed)
OT Cancellation Note  Patient Details Name: Tillmon Kisling MRN: [id number] DOB: 1941/04/19   Cancelled Treatment:    Reason Eval/Treat Not Completed: Patient declined, stating he had recently returned to floor after receiving ECT tx and was very tired at present. Politely requested that therapist return at a later date.  Josiah Lobo, PhD, MS, OTR/L 07/24/22, 3:58 PM

## 2022-07-24 NOTE — Transfer of Care (Signed)
Immediate Anesthesia Transfer of Care Note  Patient: John [name]  Procedure(s) Performed: ECT TX  Patient Location: PACU  Anesthesia Type:General  Level of Consciousness: drowsy  Airway & Oxygen Therapy: Patient Spontanous Breathing and Patient connected to face mask oxygen  Post-op Assessment: Report given to RN and Post -op Vital signs reviewed and stable  Post vital signs: Reviewed  Last Vitals:  Vitals Value Taken Time  BP 144/75 07/24/22 1238  Temp    Pulse 54 07/24/22 1240  Resp 11 07/24/22 1240  SpO2 100 % 07/24/22 1240  Vitals shown include unvalidated device data.  Last Pain:  Vitals:   07/24/22 1121  TempSrc:   PainSc: 0-No pain      Patients Stated Pain Goal: 0 (15/40/08 6761)  Complications: No notable events documented.

## 2022-07-24 NOTE — Group Note (Signed)
[place] LCSW Group Therapy Note   Group Date: 07/24/2022 Start Time: 1300 End Time: 1400   Type of Therapy/Topic:  Group Therapy:  Emotion Regulation  Participation Level:  Did Not Attend   Mood:  Description of Group:    The purpose of this group is to assist patients in learning to regulate negative emotions and experience positive emotions. Patients will be guided to discuss ways in which they have been vulnerable to their negative emotions. These vulnerabilities will be juxtaposed with experiences of positive emotions or situations, and patients challenged to use positive emotions to combat negative ones. Special emphasis will be placed on coping with negative emotions in conflict situations, and patients will process healthy conflict resolution skills.  Therapeutic Goals: Patient will identify two positive emotions or experiences to reflect on in order to balance out negative emotions:  Patient will label two or more emotions that they find the most difficult to experience:  Patient will be able to demonstrate positive conflict resolution skills through discussion or role plays:   Summary of Patient Progress: Patient was at ECT.  Therapeutic Modalities:   Cognitive Behavioral Therapy Feelings Identification Dialectical Behavioral Therapy   Rozann Lesches, LCSW

## 2022-07-24 NOTE — Progress Notes (Signed)
Patient is calm and cooperative. He denies SI, HI, and AVH. Patient says he is feeling better. Patient observed to be walking better and engaging more with others. Pulse was 54, metoprolol was held and provider notified. Patient has been NPO since midnight for ECT procedure. Patient remains safe on the unit at this time.

## 2022-07-25 ENCOUNTER — Other Ambulatory Visit: Payer: Self-pay | Admitting: Psychiatry

## 2022-07-25 DIAGNOSIS — F332 Major depressive disorder, recurrent severe without psychotic features: Secondary | ICD-10-CM | POA: Diagnosis not present

## 2022-07-25 LAB — GLUCOSE, CAPILLARY
Glucose-Capillary: 85 mg/dL (ref 70–99)
Glucose-Capillary: 86 mg/dL (ref 70–99)
Glucose-Capillary: 155 mg/dL — ABNORMAL HIGH (ref 70–99)
Glucose-Capillary: 84 mg/dL (ref 70–99)

## 2022-07-25 NOTE — Plan of Care (Signed)
Pt denies experiencing any anxiety/depression at this time. Pt denies experiencing any SI/HI/AVH or pain at this time. Pt voices he no longer wants to use his walker. Pt educated on importance of walker and how he needed to speak with the MD first. Pt provided with support and encouragement. Pt calm and cooperative. Pt medication compliant. Pt monitored q15 minutes for safety per unit policy. Plan of care ongoing.   Problem: Activity: Goal: Risk for activity intolerance will decrease Outcome: Progressing   Problem: Coping: Goal: Level of anxiety will decrease Outcome: Progressing

## 2022-07-25 NOTE — Anesthesia Postprocedure Evaluation (Signed)
Anesthesia Post Note  Patient: Abdirahman Chittum  Procedure(s) Performed: ECT TX  Patient location during evaluation: PACU Anesthesia Type: General Level of consciousness: awake and alert Pain management: pain level controlled Vital Signs Assessment: post-procedure vital signs reviewed and stable Respiratory status: spontaneous breathing, nonlabored ventilation, respiratory function stable and patient connected to nasal cannula oxygen Cardiovascular status: blood pressure returned to baseline and stable Postop Assessment: no apparent nausea or vomiting Anesthetic complications: no   No notable events documented.   Last Vitals:  Vitals:   07/24/22 2002 07/25/22 0733  BP: 99/64 118/76  Pulse: 70 60  Resp: 19   Temp: (!) 36.3 C 36.9 C  SpO2: 100% 100%    Last Pain:  Vitals:   07/25/22 0733  TempSrc: Oral  PainSc:                  Martha Clan

## 2022-07-25 NOTE — [facility] Counselor (Signed)
CSW spoke with the patient.  CSW informed that patient has two more ECT treatments scheduled, one which is causing a conflict with an appointment with Dr. Modesta Messing at [facility].  CSW explained that appointment can be canceled and rescheduled.  Patient was agreeable and asked for the change in appointment so that he can have ECT.  Patient reports that he does not have a current therapist and is open to a referral for treatment.  CSW notes that patient is much approved than earlier disposition.  Patient showed clear thought, was able to complete sentences and no tremor was noted.  Assunta Curtis, MSW, LCSW 07/25/2022 9:50 AM

## 2022-07-25 NOTE — Plan of Care (Signed)
Pt denies any anxiety/depression at this time. Pt denies experiencing any SI/HI/AVH or pain at this time. Pt is calm and cooperative. Pt is medication compliant. Pt is provided with support and encouragement. Pt is monitored q15 minutes for safety per unit policy. Plan of care ongoing.   Problem: Education: Goal: Knowledge of General Education information will improve Description: Including pain rating scale, medication(s)/side effects and non-pharmacologic comfort measures Outcome: Progressing   Problem: Activity: Goal: Risk for activity intolerance will decrease Outcome: Progressing

## 2022-07-25 NOTE — Progress Notes (Signed)
Pt presents with depressed mood, affect congruent. John [name] reports he is feeling better and is observed to be up and about more on the unit. His volition is improved, and his speech is improved as he is more spontaneous with movements and interactions with staff. He still does have some thought blocking and psychomotor retardation, but much improved from prior shifts with this patient. He is eating well, and attending to his own hygiene now and appears to engage more with peers.  Patient skin assessed again today around sacral wounds. Patient skin has improved greatly and only small wound in between buttocks, barrier cream applied.  Patient denies any SI or HI or A/V Hallucinations. NO further voiced concerns at this time and patient is able to make his needs known. Compliant with am medications.  Pt is safe, will con't to monitor.

## 2022-07-25 NOTE — [facility] Group Notes (Signed)
Pt attended recreation group and karaoke. Patient danced and attended appropriately.

## 2022-07-25 NOTE — Group Note (Signed)
[facility] LCSW Group Therapy Note   Group Date: 07/25/2022 Start Time: 1310 End Time: 1400   Type of Therapy/Topic:  Group Therapy:  Balance in Life  Participation Level:  None   Description of Group:    This group will address the concept of balance and how it feels and looks when one is unbalanced. Patients will be encouraged to process areas in their lives that are out of balance, and identify reasons for remaining unbalanced. Facilitators will guide patients utilizing problem- solving interventions to address and correct the stressor making their life unbalanced. Understanding and applying boundaries will be explored and addressed for obtaining  and maintaining a balanced life. Patients will be encouraged to explore ways to assertively make their unbalanced needs known to significant others in their lives, using other group members and facilitator for support and feedback.  Therapeutic Goals: Patient will identify two or more emotions or situations they have that consume much of in their lives. Patient will identify signs/triggers that life has become out of balance:  Patient will identify two ways to set boundaries in order to achieve balance in their lives:  Patient will demonstrate ability to communicate their needs through discussion and/or role plays  Summary of Patient Progress: Patient was present in group, however did not engage in group discussion.  Therapeutic Modalities:   Cognitive Behavioral Therapy Solution-Focused Therapy Assertiveness Training   Rozann Lesches, LCSW

## 2022-07-25 NOTE — Progress Notes (Signed)
1:1 hourly rounding  2158: Pt sleeping in bed with sitter at side. Pt's breathing is easy and unlabored. Fall and rise of chest can be observed.   2258: Pt sleeping in bed with sitter at side. Pt's breathing is easy and unlabored. Fall and rise of chest can be observed.   2358: Pt sleeping in bed with sitter at side. Pt's breathing is easy and unlabored. Fall and rise of chest can be observed.   0383: Pt sleeping in bed with sitter at side. Pt's breathing is easy and unlabored. Fall and rise of chest can be observed.   3383: Pt sleeping in bed with sitter at side. Pt's breathing is easy and unlabored. Fall and rise of chest can be observed.   2919: Pt sleeping in bed with sitter at side. Pt's breathing is easy and unlabored. Fall and rise of chest can be observed.   1660: Pt sleeping in bed with sitter at side. Pt's breathing is easy and unlabored. Fall and rise of chest can be observed.   6004: Pt sleeping in bed with sitter at side. Pt's breathing is easy and unlabored. Fall and rise of chest can be observed.   5997: Pt sleeping in bed with sitter at side. Pt's breathing is easy and unlabored. Fall and rise of chest can be observed.   7414:  Pt sleeping in bed with sitter at side. Pt's breathing is easy and unlabored. Fall and rise of chest can be observed.

## 2022-07-25 NOTE — Progress Notes (Signed)
[facility] MD Progress Note  07/25/2022 10:04 AM Sammy Cassar  MRN:  [id number] Subjective: John [name] is seen on rounds.  He is walking with a walker and doing much better.  Nurses tell me that his wounds on his backside are improving.  He is tolerating his medications and ECT.  Plan is to add him to 2 more ECTs on Friday and Monday and discharge on Wednesday.  Home health orders RN.  He is pleasant and cooperative and able to smile.  Dr. Weber Cooks recommends maintenance ECT.  Principal Problem: Severe recurrent major depression without psychotic features ([place]) Diagnosis: Principal Problem:   Severe recurrent major depression without psychotic features ([place]) Active Problems:   Major depressive disorder, recurrent severe without psychotic features ([place])   Pressure injury of skin  Total Time spent with patient: 15 minutes  Past Psychiatric History:   Patient has a history of past depression repeated episodes throughout his life.  1 prior suicide attempt.  Was admitted to the hospital in mid May here with depression.  Was not showing significant improvement over time and was referred for ECT.  Patient received a few ECT treatments which generally appeared to be tolerated well.  This past weekend however he decompensated for unclear reasons at the very end of Sunday and prior to treatment on Monday was seen to be delirious with an arrhythmia.  Arrhythmia has stabilized.  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 06/2015 CT Head: abnl thickening of mid-dist R cervical ICA w/o narrowing - ? vasculitis and thrombosed/healing dissection; b. 01/2021 Carotid U/S: 1-39% bilat ICA stenoses.   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 Age 66 - struck in head w/ baseball --> s/p craniotomy.   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 Approx 2015 - eval in [place] - conservatively managed.    Past Surgical History:  Procedure Laterality Date   ANKLE ARTHROSCOPY Left    fracture   CHOLECYSTECTOMY  2007   [place]   left frontal craniotomy for epidural hematoma   HIP ARTHROPLASTY Right 04/07/2022   Procedure: ARTHROPLASTY BIPOLAR HIP (HEMIARTHROPLASTY);  Surgeon: Earnestine Leys, MD;  Location: [facility] ORS;  Service: Orthopedics;  Laterality: Right;   INSERTION OF MESH  01/02/2022   Procedure: INSERTION OF MESH;  Surgeon: Herbert Pun, MD;  Location: [facility] ORS;  Service: General;;   LIGAMENT REPAIR Right    thumb   RETINAL DETACHMENT SURGERY     TREATMENT FISTULA ANAL  2006   Family History:  Family History  Problem Relation Age of Onset   Osteoporosis Mother    Depression Mother    Prostate cancer Father    Hypertension Father    Diabetes Brother    Kidney disease Neg Hx     Social History:  Social History  Substance and Sexual Activity  Alcohol Use Not Currently     Social History   Substance and Sexual Activity  Drug Use No    Social History   Socioeconomic History   Marital status: Married    Spouse name: [name]   Number of children: 2   Years of education: Not on file   Highest education level: Master's degree (e.g., MA, MS, MEng, MEd, MSW, MBA)  Occupational History   Not on file  Tobacco Use   Smoking status: Never   Smokeless tobacco: Never  Vaping Use   Vaping Use: Never used  Substance and Sexual Activity   Alcohol use: Not Currently   Drug use: No   Sexual activity: Yes    Birth control/protection: None  Other Topics Concern   Not on file  Social  History Narrative   Live at home with wife.    Social Determinants of Health   Financial Resource Strain: Not on file  Food Insecurity: Food Insecurity Present (07/09/2022)   Hunger Vital Sign    Worried About Running Out of Food in the Last Year: Sometimes true    Ran Out of Food in the Last Year: Sometimes true  Transportation Needs: No Transportation Needs (07/09/2022)   PRAPARE - Hydrologist (Medical): No    Lack of Transportation (Non-Medical): No  Physical Activity: Not on file  Stress: Not on file  Social Connections: Not on file   Additional Social History:                         Sleep: Good  Appetite:  Good  Current Medications: Current Facility-Administered Medications  Medication Dose Route Frequency Provider Last Rate Last Admin   0.9 %  sodium chloride infusion  500 mL Intravenous Once Clapacs, John T, MD       0.9 %  sodium chloride infusion  500 mL Intravenous Once Clapacs, John T, MD       0.9 %  sodium chloride infusion  500 mL Intravenous Once Clapacs, John T, MD       0.9 %  sodium chloride infusion  500 mL Intravenous Once Clapacs, John T, MD       acetaminophen (TYLENOL) tablet 650 mg  650 mg Oral Q6H PRN Patrecia Pour, NP   650 mg at 07/20/22 0902   alum & mag hydroxide-simeth (MAALOX/MYLANTA) 200-200-20 MG/5ML suspension 30 mL  30 mL Oral Q4H PRN Patrecia Pour, NP       ascorbic acid (VITAMIN C) tablet 500 mg  500 mg Oral BID Parks Ranger, DO   500 mg at 07/25/22 0830   aspirin EC tablet 81 mg  81 mg Oral Daily Patrecia Pour, NP   81 mg at 07/25/22 0830   buPROPion (WELLBUTRIN XL) 24 hr tablet 300 mg  300 mg Oral Daily Patrecia Pour, NP   300 mg at 07/25/22 0830   docusate sodium (COLACE) capsule 100 mg  100 mg Oral BID Patrecia Pour, NP   100 mg at 07/25/22 8921   glycopyrrolate (ROBINUL) injection 0.1 mg  0.1 mg Intravenous Once Clapacs, [name], MD       glycopyrrolate (ROBINUL) injection 0.1 mg   0.1 mg Intravenous Once Clapacs, [name], MD       glycopyrrolate (ROBINUL) injection 0.1 mg  0.1 mg Intravenous Once Clapacs, [name], MD       insulin aspart (novoLOG)  injection 0-6 Units  0-6 Units Subcutaneous TID WC Parks Ranger, DO   1 Units at 07/24/22 1646   ketorolac (TORADOL) 30 MG/ML injection 30 mg  30 mg Intravenous Once Clapacs, [name], MD       liver oil-zinc oxide (DESITIN) 40 % ointment   Topical BID Parks Ranger, DO   1 Application at 63/33/54 0830   magnesium hydroxide (MILK OF MAGNESIA) suspension 30 mL  30 mL Oral Daily PRN Patrecia Pour, NP       methocarbamol (ROBAXIN) tablet 500 mg  500 mg Oral Q6H PRN Patrecia Pour, NP       midazolam (VERSED) injection 2 mg  2 mg Intravenous Once Clapacs, John T, MD       midazolam (VERSED) injection 2 mg  2 mg Intravenous Once Clapacs, John T, MD       modafinil (PROVIGIL) tablet 100 mg  100 mg Oral QPC breakfast Parks Ranger, DO   100 mg at 07/25/22 0830   multivitamin with minerals tablet 1 tablet  1 tablet Oral Daily Parks Ranger, DO   1 tablet at 07/25/22 0829   OLANZapine (ZYPREXA) tablet 10 mg  10 mg Oral QHS Parks Ranger, DO   10 mg at 07/24/22 2110   polyethylene glycol (MIRALAX / GLYCOLAX) packet 17 g  17 g Oral Daily PRN Patrecia Pour, NP       protein supplement (ENSURE MAX) liquid  11 oz Oral BID Parks Ranger, DO   11 oz at 07/24/22 2111   senna (SENOKOT) tablet 8.6 mg  1 tablet Oral BID Patrecia Pour, NP   8.6 mg at 07/25/22 0830   sertraline (ZOLOFT) tablet 50 mg  50 mg Oral QHS Patrecia Pour, NP   50 mg at 07/24/22 2110   traZODone (DESYREL) tablet 50 mg  50 mg Oral QHS Parks Ranger, DO   50 mg at 07/24/22 2110    Lab Results:  Results for orders placed or performed during the hospital encounter of 07/04/22 (from the past 48 hour(s))  Glucose, capillary     Status: Abnormal   Collection Time: 07/23/22 11:25 AM  Result Value Ref Range    Glucose-Capillary 110 (H) 70 - 99 mg/dL    Comment: Glucose reference range applies only to samples taken after fasting for at least 8 hours.  Glucose, capillary     Status: None   Collection Time: 07/23/22  4:38 PM  Result Value Ref Range   Glucose-Capillary 88 70 - 99 mg/dL    Comment: Glucose reference range applies only to samples taken after fasting for at least 8 hours.  Glucose, capillary     Status: Abnormal   Collection Time: 07/23/22  7:42 PM  Result Value Ref Range   Glucose-Capillary 171 (H) 70 - 99 mg/dL    Comment: Glucose reference range applies only to samples taken after fasting for at least 8 hours.  Glucose, capillary     Status: None   Collection Time: 07/24/22  7:22 AM  Result Value Ref Range   Glucose-Capillary 96 70 - 99 mg/dL    Comment: Glucose reference range applies only to samples taken after fasting for at least 8 hours.  Glucose, capillary     Status: None   Collection Time: 07/24/22  1:03 PM  Result Value Ref Range   Glucose-Capillary 98 70 - 99 mg/dL    Comment: Glucose reference range applies only to samples  taken after fasting for at least 8 hours.   Comment 1 Notify RN    Comment 2 Document in Chart   Glucose, capillary     Status: Abnormal   Collection Time: 07/24/22  3:52 PM  Result Value Ref Range   Glucose-Capillary 179 (H) 70 - 99 mg/dL    Comment: Glucose reference range applies only to samples taken after fasting for at least 8 hours.  Glucose, capillary     Status: Abnormal   Collection Time: 07/24/22  8:11 PM  Result Value Ref Range   Glucose-Capillary 145 (H) 70 - 99 mg/dL    Comment: Glucose reference range applies only to samples taken after fasting for at least 8 hours.  Glucose, capillary     Status: None   Collection Time: 07/25/22  7:09 AM  Result Value Ref Range   Glucose-Capillary 85 70 - 99 mg/dL    Comment: Glucose reference range applies only to samples taken after fasting for at least 8 hours.    Blood Alcohol level:   Lab Results  Component Value Date   ETH <10 07/02/2022   ETH <10 50/93/2671    Metabolic Disorder Labs: Lab Results  Component Value Date   HGBA1C 5.6 03/19/2022   MPG 114.02 03/19/2022   No results found for: "PROLACTIN" Lab Results  Component Value Date   CHOL 131 03/19/2022   TRIG 92 03/19/2022   HDL 33 (L) 03/19/2022   CHOLHDL 4.0 03/19/2022   VLDL 18 03/19/2022   LDLCALC 80 03/19/2022    Physical Findings: AIMS:  , ,  ,  ,    CIWA:    COWS:     Musculoskeletal: Strength & Muscle Tone: within normal limits Gait & Station: normal Patient leans: N/A  Psychiatric Specialty Exam:  Presentation  General Appearance: Appropriate for Environment; Casual  Eye Contact:Fleeting  Speech:Garbled  Speech Volume:Decreased  Handedness:Right   Mood and Affect  Mood:Anxious; Depressed  Affect:Flat   Thought Process  Thought Processes:Linear; Coherent; Goal Directed  Descriptions of Associations:Loose  Orientation:Partial  Thought Content:Scattered  History of Schizophrenia/Schizoaffective disorder:No  Duration of Psychotic Symptoms:No data recorded Hallucinations:No data recorded Ideas of Reference:None  Suicidal Thoughts:No data recorded Homicidal Thoughts:No data recorded  Sensorium  Memory:Recent Poor; Remote Poor  Judgment:Poor  Insight:Fair   Executive Functions  Concentration:Fair  Attention Span:Poor  Recall:Poor  Fund of Knowledge:Poor  Language:Poor   Psychomotor Activity  Psychomotor Activity:No data recorded  Assets  Assets:Financial Resources/Insurance   Sleep  Sleep:No data recorded   Physical Exam: Physical Exam Vitals and nursing note reviewed.  Constitutional:      Appearance: Normal appearance. He is normal weight.  Neurological:     General: No focal deficit present.     Mental Status: He is alert and oriented to person, place, and time.  Psychiatric:        Attention and Perception: Attention and  perception normal.        Mood and Affect: Mood is depressed. Affect is flat.        Speech: Speech normal.        Behavior: Behavior normal. Behavior is cooperative.        Thought Content: Thought content normal.        Cognition and Memory: Cognition is impaired. Memory is impaired.        Judgment: Judgment normal.    Review of Systems  Constitutional: Negative.   HENT: Negative.    Eyes: Negative.   Respiratory: Negative.  Cardiovascular: Negative.   Gastrointestinal: Negative.   Genitourinary: Negative.   Musculoskeletal: Negative.   Skin: Negative.   Neurological: Negative.   Endo/Heme/Allergies: Negative.   Psychiatric/Behavioral:  Positive for depression and memory loss.    Blood pressure 118/76, pulse 60, temperature 98.5 F (36.9 C), temperature source Oral, resp. rate 19, height '6\' 2"'$  (1.88 m), weight 70.8 kg, SpO2 100 %. Body mass index is 20.03 kg/m.   Treatment Plan Summary: Daily contact with patient to assess and evaluate symptoms and progress in treatment, Medication management, and Plan continue current medications and ECT.  [place], DO 07/25/2022, 10:04 AM

## 2022-07-26 ENCOUNTER — Ambulatory Visit: Payer: Medicare Other

## 2022-07-26 ENCOUNTER — Other Ambulatory Visit: Payer: Self-pay | Admitting: Psychiatry

## 2022-07-26 ENCOUNTER — Encounter: Payer: Self-pay | Admitting: Psychiatry

## 2022-07-26 ENCOUNTER — Inpatient Hospital Stay: Payer: Medicare Other | Admitting: Certified Registered"

## 2022-07-26 DIAGNOSIS — F332 Major depressive disorder, recurrent severe without psychotic features: Secondary | ICD-10-CM | POA: Diagnosis not present

## 2022-07-26 LAB — GLUCOSE, CAPILLARY
Glucose-Capillary: 99 mg/dL (ref 70–99)
Glucose-Capillary: 67 mg/dL — ABNORMAL LOW (ref 70–99)
Glucose-Capillary: 195 mg/dL — ABNORMAL HIGH (ref 70–99)
Glucose-Capillary: 101 mg/dL — ABNORMAL HIGH (ref 70–99)

## 2022-07-26 MED ORDER — KETOROLAC TROMETHAMINE 30 MG/ML IJ SOLN
INTRAMUSCULAR | Status: AC
  Administered 2022-07-26: 30 mg via INTRAVENOUS
  Filled 2022-07-26: qty 1

## 2022-07-26 MED ORDER — KETAMINE HCL 50 MG/5ML IJ SOSY
PREFILLED_SYRINGE | INTRAMUSCULAR | Status: AC
  Filled 2022-07-26: qty 5

## 2022-07-26 MED ORDER — MIDAZOLAM HCL 2 MG/2ML IJ SOLN
2.0000 mg | Freq: Once | INTRAMUSCULAR | Status: DC
  Filled 2022-07-26: qty 2

## 2022-07-26 MED ORDER — MIDAZOLAM HCL 2 MG/2ML IJ SOLN
INTRAMUSCULAR | Status: AC
  Filled 2022-07-26: qty 2

## 2022-07-26 MED ORDER — SUCCINYLCHOLINE CHLORIDE 200 MG/10ML IV SOSY
PREFILLED_SYRINGE | INTRAVENOUS | Status: DC | PRN
  Administered 2022-07-26: 100 mg via INTRAVENOUS

## 2022-07-26 MED ORDER — MIDAZOLAM HCL 2 MG/2ML IJ SOLN
INTRAMUSCULAR | Status: DC | PRN
  Administered 2022-07-26: 2 mg via INTRAVENOUS

## 2022-07-26 MED ORDER — GLYCOPYRROLATE 0.2 MG/ML IJ SOLN
0.1000 mg | Freq: Once | INTRAMUSCULAR | Status: DC
  Filled 2022-07-26: qty 0.5

## 2022-07-26 MED ORDER — LABETALOL HCL 5 MG/ML IV SOLN
INTRAVENOUS | Status: AC
  Filled 2022-07-26: qty 4

## 2022-07-26 MED ORDER — SODIUM CHLORIDE 0.9 % IV SOLN: INTRAVENOUS | Status: DC | PRN

## 2022-07-26 MED ORDER — LABETALOL HCL 5 MG/ML IV SOLN
INTRAVENOUS | Status: DC | PRN
  Administered 2022-07-26: 10 mg via INTRAVENOUS

## 2022-07-26 MED ORDER — KETOROLAC TROMETHAMINE 30 MG/ML IJ SOLN: 30.0000 mg | Freq: Once | INTRAMUSCULAR | Status: DC

## 2022-07-26 MED ORDER — SODIUM CHLORIDE 0.9 % IV SOLN: 500.0000 mL | Freq: Once | INTRAVENOUS | Status: DC

## 2022-07-26 MED ORDER — GLYCOPYRROLATE 0.2 MG/ML IJ SOLN
INTRAMUSCULAR | Status: AC
  Filled 2022-07-26: qty 1

## 2022-07-26 MED ORDER — KETAMINE HCL 10 MG/ML IJ SOLN
INTRAMUSCULAR | Status: DC | PRN
  Administered 2022-07-26: 100 mg via INTRAVENOUS

## 2022-07-26 NOTE — Progress Notes (Signed)
Pt presents pleasant, was maintained NPO overnight for ECT. He denied any issues, complied with peri-care and application of barrier cream as ordered. His morning meds held this morning and was administered after he returned from ECT without any issues around 1315. Vitals and CBG completed and he ate his late lunch tray and drank his ensure. Pt requires reminders to ambulate using his walker. No falls or unsafe behavior noted thus far. He was observed interacting with his select peers and attending groups offered in unit. Q15 min observations maintained for safety and support provided as needed.

## 2022-07-26 NOTE — Progress Notes (Signed)
Physical Therapy Treatment Patient Details Name: John [name] MRN: [id number] DOB: 12-Jan-1941 Today's Date: 07/26/2022   History of Present Illness 81 y/o M who was voluntarily admitted to geriatric psychiatry for worsening depression. Pt was d/c from the unit ~2 months ago & was noncompliatn with medications. PMH: CA, depression, DM2, epidural hematoma s/p craniotomy (age 85), Horner syndrome, HTN, migraine, obesity, sleep apnea.    PT Comments    Pt seen for PT tx with pt agreeable. Pt ambulated around unit without AD with supervision. Pt ambulates outdoors with changes in speed upon command, as well as head turns. Pt with decreased gait speed with head turns. Back in hallway, pt performs retrograde gait without AD with min/mod assist with focus on high level balance training. Pt engaged in 10x STS without BUE support with focus on BLE strengthening. Will continue to follow pt acutely to address balance, strengthening, and gait with LRAD.    Recommendations for follow up therapy are one component of a multi-disciplinary discharge planning process, led by the attending physician.  Recommendations may be updated based on patient status, additional functional criteria and insurance authorization.  Follow Up Recommendations  Outpatient PT     Assistance Recommended at Discharge Intermittent Supervision/Assistance  Patient can return home with the following A little help with walking and/or transfers;A little help with bathing/dressing/bathroom;Assistance with cooking/housework;Direct supervision/assist for financial management;Assist for transportation;Help with stairs or ramp for entrance;Direct supervision/assist for medications management   Equipment Recommendations  Rolling walker (2 wheels)    Recommendations for Other Services       Precautions / Restrictions Precautions Precautions: Fall Restrictions Weight Bearing Restrictions: No     Mobility  Bed Mobility                     Transfers Overall transfer level: Modified independent                 General transfer comment: STS without AD    Ambulation/Gait Ambulation/Gait assistance: Supervision Gait Distance (Feet):  (>500 ft) Assistive device: None Gait Pattern/deviations: Step-through pattern           Stairs             Wheelchair Mobility    Modified Rankin (Stroke Patients Only)       Balance Overall balance assessment: Needs assistance Sitting-balance support: No upper extremity supported, Feet supported Sitting balance-Leahy Scale: Normal     Standing balance support: No upper extremity supported Standing balance-Leahy Scale: Good                              Cognition Arousal/Alertness: Awake/alert Behavior During Therapy: Flat affect Overall Cognitive Status: Within Functional Limits for tasks assessed                                 General Comments: Follows simple commands throughout session. Pleasant, more engaged during session, asking to go outside.        Exercises Other Exercises Other Exercises: 10x STS without BUE support with supervision for BLE strengthening    General Comments        Pertinent Vitals/Pain Pain Assessment Pain Assessment: No/denies pain    Home Living                          Prior Function  PT Goals (current goals can now be found in the care plan section) Acute Rehab PT Goals Patient Stated Goal: improve hip strength and balance Time For Goal Achievement: 08/02/22 Potential to Achieve Goals: Good Progress towards PT goals: Progressing toward goals    Frequency    Min 2X/week      PT Plan Current plan remains appropriate    Co-evaluation              AM-PAC PT "6 Clicks" Mobility   Outcome Measure  Help needed turning from your back to your side while in a flat bed without using bedrails?: None Help needed moving from lying on your back to  sitting on the side of a flat bed without using bedrails?: None Help needed moving to and from a bed to a chair (including a wheelchair)?: None Help needed standing up from a chair using your arms (e.g., wheelchair or bedside chair)?: None Help needed to walk in hospital room?: A Little Help needed climbing 3-5 steps with a railing? : A Little 6 Click Score: 22    End of Session   Activity Tolerance: Patient tolerated treatment well Patient left:  (in chair in dayroom)   PT Visit Diagnosis: Unsteadiness on feet (R26.81);Muscle weakness (generalized) (M62.81)     Time: 2395-3202 PT Time Calculation (min) (ACUTE ONLY): 10 min  Charges:  $Therapeutic Activity: 8-22 mins                     John [name], PT, DPT 07/26/22, 4:06 PM    Waunita Schooner 07/26/2022, 4:05 PM

## 2022-07-26 NOTE — Transfer of Care (Signed)
Immediate Anesthesia Transfer of Care Note  Patient: John [name]  Procedure(s) Performed: ECT TX  Patient Location: PACU  Anesthesia Type:General  Level of Consciousness: drowsy  Airway & Oxygen Therapy: Patient Spontanous Breathing and Patient connected to face mask oxygen  Post-op Assessment: Report given to RN, Post -op Vital signs reviewed and stable and Patient moving all extremities  Post vital signs: Reviewed and stable  Last Vitals:  Vitals Value Taken Time  BP 134/78 07/26/22 1222  Temp 36.3 C 07/26/22 1222  Pulse 65 07/26/22 1225  Resp 14 07/26/22 1225  SpO2 100 % 07/26/22 1225  Vitals shown include unvalidated device data.  Last Pain:  Vitals:   07/26/22 1222  TempSrc:   PainSc: Asleep      Patients Stated Pain Goal: 0 (57/47/34 0370)  Complications: No notable events documented.

## 2022-07-26 NOTE — BH IP Treatment Plan (Signed)
Interdisciplinary Treatment and Diagnostic Plan Update  07/26/2022 Time of Session: 9:00AM John [name] MRN: [id number]  Principal Diagnosis: Severe recurrent major depression without psychotic features [facility])  Secondary Diagnoses: Principal Problem:   Severe recurrent major depression without psychotic features ([place]) Active Problems:   Major depressive disorder, recurrent severe without psychotic features ([place])   Pressure injury of skin   Current Medications:  Current Facility-Administered Medications  Medication Dose Route Frequency Provider Last Rate Last Admin   0.9 %  sodium chloride infusion  500 mL Intravenous Once Clapacs, John T, MD       0.9 %  sodium chloride infusion  500 mL Intravenous Once Clapacs, John T, MD       0.9 %  sodium chloride infusion  500 mL Intravenous Once Clapacs, John T, MD       0.9 %  sodium chloride infusion  500 mL Intravenous Once Clapacs, John T, MD       0.9 %  sodium chloride infusion  500 mL Intravenous Once Clapacs, John T, MD       acetaminophen (TYLENOL) tablet 650 mg  650 mg Oral Q6H PRN Patrecia Pour, NP   650 mg at 07/20/22 0902   alum & mag hydroxide-simeth (MAALOX/MYLANTA) 200-200-20 MG/5ML suspension 30 mL  30 mL Oral Q4H PRN Patrecia Pour, NP       ascorbic acid (VITAMIN C) tablet 500 mg  500 mg Oral BID Parks Ranger, DO   500 mg at 07/26/22 1329   aspirin EC tablet 81 mg  81 mg Oral Daily Patrecia Pour, NP   81 mg at 07/26/22 1327   buPROPion (WELLBUTRIN XL) 24 hr tablet 300 mg  300 mg Oral Daily Patrecia Pour, NP   300 mg at 07/26/22 1329   docusate sodium (COLACE) capsule 100 mg  100 mg Oral BID Patrecia Pour, NP   100 mg at 07/26/22 1328   glycopyrrolate (ROBINUL) 0.2 MG/ML injection            glycopyrrolate (ROBINUL) injection 0.1 mg  0.1 mg Intravenous Once Clapacs, [name], MD       glycopyrrolate (ROBINUL) injection 0.1 mg  0.1 mg Intravenous Once Clapacs, [name], MD       glycopyrrolate (ROBINUL) injection  0.1 mg  0.1 mg Intravenous Once Clapacs, John T, MD       insulin aspart (novoLOG) injection 0-6 Units  0-6 Units Subcutaneous TID WC Parks Ranger, DO   1 Units at 07/25/22 1627   ketorolac (TORADOL) 30 MG/ML injection 30 mg  30 mg Intravenous Once Clapacs, [name], MD       liver oil-zinc oxide (DESITIN) 40 % ointment   Topical BID Parks Ranger, DO   Given at 07/26/22 0857   magnesium hydroxide (MILK OF MAGNESIA) suspension 30 mL  30 mL Oral Daily PRN Patrecia Pour, NP       methocarbamol (ROBAXIN) tablet 500 mg  500 mg Oral Q6H PRN Patrecia Pour, NP       midazolam (VERSED) injection 2 mg  2 mg Intravenous Once Clapacs, John T, MD       midazolam (VERSED) injection 2 mg  2 mg Intravenous Once Clapacs, John T, MD       midazolam (VERSED) injection 2 mg  2 mg Intravenous Once Clapacs, John T, MD       modafinil (PROVIGIL) tablet 100 mg  100 mg Oral QPC breakfast Parks Ranger,  DO   100 mg at 07/26/22 1330   multivitamin with minerals tablet 1 tablet  1 tablet Oral Daily Parks Ranger, DO   1 tablet at 07/26/22 1326   OLANZapine (ZYPREXA) tablet 10 mg  10 mg Oral QHS Parks Ranger, DO   10 mg at 07/25/22 2143   polyethylene glycol (MIRALAX / GLYCOLAX) packet 17 g  17 g Oral Daily PRN Patrecia Pour, NP       protein supplement (ENSURE MAX) liquid  11 oz Oral BID Parks Ranger, DO   11 oz at 07/26/22 1344   senna (SENOKOT) tablet 8.6 mg  1 tablet Oral BID Patrecia Pour, NP   8.6 mg at 07/26/22 1333   sertraline (ZOLOFT) tablet 50 mg  50 mg Oral QHS Patrecia Pour, NP   50 mg at 07/25/22 2143   traZODone (DESYREL) tablet 50 mg  50 mg Oral QHS Parks Ranger, DO   50 mg at 07/25/22 2143   PTA Medications: Medications Prior to Admission  Medication Sig Dispense Refill Last Dose   aspirin EC 81 MG tablet Take 81 mg by mouth daily.   07/14/2022   buPROPion (WELLBUTRIN XL) 300 MG 24 hr tablet Take 1 tablet by mouth daily.    07/14/2022   docusate sodium (COLACE) 100 MG capsule Take 1 capsule (100 mg total) by mouth 2 (two) times daily. 10 capsule 0 07/14/2022   methocarbamol (ROBAXIN) 500 MG tablet Take 1 tablet (500 mg total) by mouth every 6 (six) hours as needed for muscle spasms. 30 tablet 0 07/14/2022   metoprolol tartrate (LOPRESSOR) 25 MG tablet Take 1 tablet (25 mg total) by mouth 2 (two) times daily. 60 tablet 3 07/14/2022   polyethylene glycol (MIRALAX / GLYCOLAX) 17 g packet Take 17 g by mouth daily as needed. 14 each 0 07/14/2022   senna (SENOKOT) 8.6 MG TABS tablet Take 1 tablet (8.6 mg total) by mouth 2 (two) times daily. 120 tablet 0 07/14/2022   sertraline (ZOLOFT) 50 MG tablet Take 1 tablet (50 mg total) by mouth at bedtime. 30 tablet 0 07/14/2022   sertraline (ZOLOFT) 50 MG tablet Take 1 tablet (50 mg total) by mouth daily. 30 tablet 1 07/14/2022   tamsulosin (FLOMAX) 0.4 MG CAPS capsule Take 1 capsule (0.4 mg total) by mouth daily after supper. 30 capsule 3 07/14/2022   traZODone (DESYREL) 50 MG tablet Take 1 tablet (50 mg total) by mouth at bedtime as needed for sleep. 30 tablet 3 07/14/2022    Patient Stressors: Marital or family conflict   Medication change or noncompliance    Patient Strengths: Scientist, research (life sciences)  Supportive family/friends   Treatment Modalities: Medication Management, Group therapy, Case management,  1 to 1 session with clinician, Psychoeducation, Recreational therapy.   Physician Treatment Plan for Primary Diagnosis: Severe recurrent major depression without psychotic features ([place]) Long Term Goal(s): Improvement in symptoms so as ready for discharge   Short Term Goals: Ability to identify changes in lifestyle to reduce recurrence of condition will improve Ability to verbalize feelings will improve Ability to disclose and discuss suicidal ideas Ability to demonstrate self-control will improve Ability to identify and develop effective coping behaviors will improve Ability to  maintain clinical measurements within normal limits will improve Compliance with prescribed medications will improve Ability to identify triggers associated with substance abuse/mental health issues will improve  Medication Management: Evaluate patient's response, side effects, and tolerance of medication regimen.  Therapeutic Interventions: 1 to 1  sessions, Unit Group sessions and Medication administration.  Evaluation of Outcomes: Progressing  Physician Treatment Plan for Secondary Diagnosis: Principal Problem:   Severe recurrent major depression without psychotic features ([place]) Active Problems:   Major depressive disorder, recurrent severe without psychotic features ([place])   Pressure injury of skin  Long Term Goal(s): Improvement in symptoms so as ready for discharge   Short Term Goals: Ability to identify changes in lifestyle to reduce recurrence of condition will improve Ability to verbalize feelings will improve Ability to disclose and discuss suicidal ideas Ability to demonstrate self-control will improve Ability to identify and develop effective coping behaviors will improve Ability to maintain clinical measurements within normal limits will improve Compliance with prescribed medications will improve Ability to identify triggers associated with substance abuse/mental health issues will improve     Medication Management: Evaluate patient's response, side effects, and tolerance of medication regimen.  Therapeutic Interventions: 1 to 1 sessions, Unit Group sessions and Medication administration.  Evaluation of Outcomes: Progressing   RN Treatment Plan for Primary Diagnosis: Severe recurrent major depression without psychotic features ([place]) Long Term Goal(s): Knowledge of disease and therapeutic regimen to maintain health will improve  Short Term Goals: Ability to demonstrate self-control, Ability to participate in decision making will improve, Ability to verbalize feelings  will improve, Ability to disclose and discuss suicidal ideas, Ability to identify and develop effective coping behaviors will improve, and Compliance with prescribed medications will improve  Medication Management: RN will administer medications as ordered by provider, will assess and evaluate patient's response and provide education to patient for prescribed medication. RN will report any adverse and/or side effects to prescribing provider.  Therapeutic Interventions: 1 on 1 counseling sessions, Psychoeducation, Medication administration, Evaluate responses to treatment, Monitor vital signs and CBGs as ordered, Perform/monitor CIWA, COWS, AIMS and Fall Risk screenings as ordered, Perform wound care treatments as ordered.  Evaluation of Outcomes: Progressing   LCSW Treatment Plan for Primary Diagnosis: Severe recurrent major depression without psychotic features ([place]) Long Term Goal(s): Safe transition to appropriate next level of care at discharge, Engage patient in therapeutic group addressing interpersonal concerns.  Short Term Goals: Engage patient in aftercare planning with referrals and resources, Increase social support, Increase ability to appropriately verbalize feelings, Increase emotional regulation, Facilitate acceptance of mental health diagnosis and concerns, and Increase skills for wellness and recovery  Therapeutic Interventions: Assess for all discharge needs, 1 to 1 time with Social worker, Explore available resources and support systems, Assess for adequacy in community support network, Educate family and significant other(s) on suicide prevention, Complete Psychosocial Assessment, Interpersonal group therapy.  Evaluation of Outcomes: Progressing   Progress in Treatment: Attending groups: Yes. Participating in groups: Yes. Taking medication as prescribed: Yes. Toleration medication: Yes. Family/Significant other contact made: Yes, individual(s) contacted:  SPE completed  with the patient's wife Patient understands diagnosis: Yes. Discussing patient identified problems/goals with staff: Yes. Medical problems stabilized or resolved: Yes. Denies suicidal/homicidal ideation: Yes. Issues/concerns per patient self-inventory: No. Other: none  New problem(s) identified: No, Describe:  none Update 07/10/2022:  No changes at this time. Update 07/15/2022:  No changes at this time.  Update 07/21/2022:  No changes at this time. Update 9/29/203:  No changes at this time.   New Short Term/Long Term Goal(s):  medication management for mood stabilization; elimination of SI thoughts; development of comprehensive mental wellness plan.  Update 07/10/2022:  No changes at this time. Update 07/15/2022:  No changes at this time. Update 07/21/2022:  No changes  at this time.  Update 9/29/203:  No changes at this time.   Patient Goals:  "get better"Update 07/10/2022:  No changes at this time. Update 07/15/2022:  No changes at this time.  Update 07/21/2022:  No changes at this time. Update 9/29/203:  No changes at this time.   Discharge Plan or Barriers: CSW to assist patient in development of appropriate discharge plans. Patient struggled with speech during treatment team. Update 07/10/2022:  Patient reports plans to return home.  Patient reports that he would like to continue with his current mental health provider.  Patient continues to work with PT/OT and has plans to begin ECT.  Changes to discharge treatment pending recommendations of other disciplines working with the patient. Update 07/15/2022:  No changes at this time. Update 07/21/2022:  Patient has begun ECT at this time.  Patient continues to progress.  Patient continues with PT and recommendations remain the same for the patient to return home with home health PT. Update 9/29/203:  CSW has scheduled patient for aftercare with [facility] for medication management, as well as [facility] for therapy.  Home Health for PT and OT has been  established.  CSW will arrange for [facility] once orders are placed.   Reason for Continuation of Hospitalization: Anxiety Depression Medical Issues Medication stabilization   Estimated Length of Stay:  1-7 days Update 07/10/2022:  No changes at this time.  Update 07/15/2022:  TBD  Update 9/29/203:  TBD  Last 3 [place] Suicide Severity Risk Score: Flowsheet Row Admission (Current) from 07/04/2022 in [place] ED from 07/03/2022 in [place] Office Visit from 06/11/2022 in confidential department  C-SSRS RISK CATEGORY Error: Q3, 4, or 5 should not be populated when Q2 is No No Risk Error: Q3, 4, or 5 should not be populated when Q2 is No       Last PHQ 2/9 Scores:    06/11/2022    2:20 PM 05/20/2022    1:11 PM 12/21/2021    8:59 AM  Depression screen PHQ 2/9  Decreased Interest 3 1 0  Down, Depressed, Hopeless 1 1 0  PHQ - 2 Score 4 2 0  Altered sleeping 1 1   Tired, decreased energy 1 2   Change in appetite 2 1   Feeling bad or failure about yourself  1 1   Trouble concentrating 2 2   Moving slowly or fidgety/restless 2 2   Suicidal thoughts 0 0   PHQ-9 Score 13 11   Difficult doing work/chores Somewhat difficult      Scribe for Treatment Team: Gates Rigg 07/26/2022 3:47 PM

## 2022-07-26 NOTE — Progress Notes (Signed)
1:1 Hourly Rounding  2215: Pt is sleeping with sitter at side. Pt's breathing is regular, easy, unlabored. Fall and rise of chx observed.   2315: Pt is sleeping with sitter at side. Pt's breathing is regular, easy, unlabored. Fall and rise of chx observed.   0015: Pt is sleeping with sitter at side. Pt's breathing is regular, easy, unlabored. Fall and rise of chx observed.   0115: Pt is sleeping with sitter at side. Pt's breathing is regular, easy, unlabored. Fall and rise of chx observed.   0215: Pt is sleeping with sitter at side. Pt's breathing is regular, easy, unlabored. Fall and rise of chx observed.   0315:  Pt is sleeping with sitter at side. Pt's breathing is regular, easy, unlabored. Fall and rise of chx observed.   1102: Pt is sleeping with sitter at side. Pt's breathing is regular, easy, unlabored. Fall and rise of chx observed.   0515: Pt is sleeping with sitter at side. Pt's breathing is regular, easy, unlabored. Fall and rise of chx observed.   1117: Pt is awake with sitter at his side. Pt is calm and cooperative.

## 2022-07-26 NOTE — Addendum Note (Signed)
Addendum  created 07/26/22 1211 by Dimas Millin, MD   Attestation recorded in [facility], Clinical Note Signed, [place] filed

## 2022-07-26 NOTE — Progress Notes (Signed)
Occupational Therapy Treatment Patient Details Name: John [name] MRN: [id number] DOB: 08/20/1941 Today's Date: 07/26/2022   History of present illness 81 y/o M who was voluntarily admitted to geriatric psychiatry for worsening depression. Pt was d/c from the unit ~2 months ago & was noncompliatn with medications. PMH: CA, depression, DM2, epidural hematoma s/p craniotomy (age 43), Horner syndrome, HTN, migraine, obesity, sleep apnea.   OT comments  Mr Sarli was seen for OT treatment on this date. Upon arrival to room pt seated in day room finishing lunch, agreeable to tx. No AD use this session. Pt requires SBA carry meal tray and throw away, 1 minor LOB able to correct. MIN cues to empty cup. SUPERVISION making bed.   The SLUMS is a 30-point screening questionnaire that tests orientation, memory, attention, problem solving, and executive function. 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 This indicates the need for further assessment. Pt with noted impairments in working short term memory limiting ability to participate functionally in scheduling and medication mgmt. Pt making good progress toward goals, will continue to follow POC. Discharge recommendation remains appropriate.     Recommendations for follow up therapy are one component of a multi-disciplinary discharge planning process, led by the attending physician.  Recommendations may be updated based on patient status, additional functional criteria and insurance authorization.    Follow Up Recommendations  Home health OT    Assistance Recommended at Discharge Intermittent Supervision/Assistance  Patient can return home with the following  Help with stairs or ramp for entrance;Assistance with cooking/housework   Equipment Recommendations  BSC/3in1    Recommendations for Other Services      Precautions / Restrictions Precautions Precautions: Fall Restrictions Weight Bearing Restrictions: No       Mobility Bed Mobility                General bed mobility comments: NT    Transfers Overall transfer level: Needs assistance   Transfers: Sit to/from Stand Sit to Stand: Supervision                 Balance Overall balance assessment: Needs assistance Sitting-balance support: No upper extremity supported, Feet supported Sitting balance-Leahy Scale: Normal     Standing balance support: No upper extremity supported, During functional activity Standing balance-Leahy Scale: Good                             ADL either performed or assessed with clinical judgement   ADL Overall ADL's : Needs assistance/impaired                                       General ADL Comments: SBA carry meal tray and throw away, 1 minor LOB able to correct. MIN cues to empty cup. SUPERVISION making bed.      Cognition Arousal/Alertness: Awake/alert Behavior During Therapy: WFL for tasks assessed/performed Overall Cognitive Status: Within Functional Limits for tasks assessed                                 General Comments: SLUMS 15/30                   Pertinent Vitals/ Pain       Pain Assessment Pain Assessment: No/denies pain   Frequency  Min 2X/week  Progress Toward Goals  OT Goals(current goals can now be found in the care plan section)  Progress towards OT goals: Progressing toward goals  Acute Rehab OT Goals Patient Stated Goal: to go home OT Goal Formulation: With patient Time For Goal Achievement: 08/01/22 Potential to Achieve Goals: Good ADL Goals Pt Will Perform Tub/Shower Transfer: Tub transfer;Independently;ambulating Additional ADL Goal #1: Pt will identify x3 preferred leisure tasks and initiate with no cues Additional ADL Goal #2: Pt will complete medication mgmt task with no cues  Plan Discharge plan remains appropriate;Frequency remains appropriate    Co-evaluation                 AM-PAC OT "6 Clicks" Daily Activity      Outcome Measure   Help from another person eating meals?: None Help from another person taking care of personal grooming?: None Help from another person toileting, which includes using toliet, bedpan, or urinal?: A Little Help from another person bathing (including washing, rinsing, drying)?: A Little Help from another person to put on and taking off regular upper body clothing?: A Little Help from another person to put on and taking off regular lower body clothing?: A Little 6 Click Score: 20    End of Session Equipment Utilized During Treatment: Rolling walker (2 wheels)  OT Visit Diagnosis: Other abnormalities of gait and mobility (R26.89);Muscle weakness (generalized) (M62.81)   Activity Tolerance Patient tolerated treatment well   Patient Left  (up with PT)   Nurse Communication          Time: 1025-8527 OT Time Calculation (min): 22 min  Charges: OT General Charges $OT Visit: 1 Visit OT Treatments $Self Care/Home Management : 8-22 mins  Dessie Coma, M.S. OTR/L  07/26/22, 4:13 PM  ascom [phone]

## 2022-07-26 NOTE — Group Note (Signed)
[facility] LCSW Group Therapy Note   Group Date: 07/26/2022 Start Time: 1310 End Time: 1400   Type of Therapy and Topic: Group Therapy: Avoiding Self-Sabotaging and Enabling Behaviors  Participation Level: None  Mood:  Description of Group:  In this group, patients will learn how to identify obstacles, self-sabotaging and enabling behaviors, as well as: what are they, why do we do them and what needs these behaviors meet. Discuss unhealthy relationships and how to have positive healthy boundaries with those that sabotage and enable. Explore aspects of self-sabotage and enabling in yourself and how to limit these self-destructive behaviors in everyday life.   Therapeutic Goals: 1. Patient will identify one obstacle that relates to self-sabotage and enabling behaviors 2. Patient will identify one personal self-sabotaging or enabling behavior they did prior to admission 3. Patient will state a plan to change the above identified behavior 4. Patient will demonstrate ability to communicate their needs through discussion and/or role play.    Summary of Patient Progress: Patient was present in group, however did not engage in group discussion.  Therapeutic Modalities:  Cognitive Behavioral Therapy Person-Centered Therapy Motivational Interviewing    Rozann Lesches, LCSW

## 2022-07-26 NOTE — H&P (Signed)
John [name] is an 81 y.o. male.   Chief Complaint: No new complaints.  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 06/2015 CT Head: abnl thickening of mid-dist R cervical ICA w/o narrowing - ? vasculitis and thrombosed/healing dissection; b. 01/2021 Carotid U/S: 1-39% bilat ICA stenoses.   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 Age 81 - struck in head w/ baseball --> s/p craniotomy.   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 Approx 2015 - eval in [place] - conservatively managed.    Past Surgical History:  Procedure Laterality Date   ANKLE ARTHROSCOPY Left    fracture   CHOLECYSTECTOMY  2007   [place]   left frontal craniotomy for epidural hematoma   HIP ARTHROPLASTY Right 04/07/2022   Procedure: ARTHROPLASTY BIPOLAR HIP (HEMIARTHROPLASTY);  Surgeon: Earnestine Leys, MD;  Location: [facility] ORS;  Service: Orthopedics;  Laterality: Right;   INSERTION OF MESH  01/02/2022   Procedure: INSERTION OF MESH;  Surgeon: Herbert Pun, MD;  Location: [facility] ORS;  Service: General;;   LIGAMENT REPAIR Right    thumb   RETINAL DETACHMENT SURGERY     TREATMENT FISTULA ANAL  2006    Family History  Problem Relation Age of Onset   Osteoporosis Mother    Depression Mother    Prostate cancer Father    Hypertension Father    Diabetes Brother    Kidney disease Neg Hx    Social History:  reports that he has never smoked. He has never used smokeless  tobacco. He reports that he does not currently use alcohol. He reports that he does not use drugs.  Allergies: No Known Allergies  Medications Prior to Admission  Medication Sig Dispense Refill   aspirin EC 81 MG tablet Take 81 mg by mouth daily.     buPROPion (WELLBUTRIN XL) 300 MG 24 hr tablet Take 1 tablet by mouth daily.     docusate sodium (COLACE) 100 MG capsule Take 1 capsule (100 mg total) by mouth 2 (two) times daily. 10 capsule 0   methocarbamol (ROBAXIN) 500 MG tablet Take 1 tablet (500 mg total) by mouth every 6 (six) hours as needed for muscle spasms. 30 tablet 0   metoprolol tartrate (LOPRESSOR) 25 MG tablet Take 1 tablet (25 mg total) by mouth 2 (two) times daily. 60 tablet 3   polyethylene glycol (MIRALAX / GLYCOLAX) 17 g packet Take 17 g by mouth daily as needed. 14 each 0   senna (SENOKOT) 8.6 MG TABS tablet Take 1 tablet (8.6 mg total) by mouth 2 (two) times daily. 120 tablet 0   sertraline (ZOLOFT) 50 MG tablet Take 1 tablet (50 mg total) by mouth at bedtime. 30 tablet 0   sertraline (ZOLOFT) 50 MG tablet Take 1  tablet (50 mg total) by mouth daily. 30 tablet 1   tamsulosin (FLOMAX) 0.4 MG CAPS capsule Take 1 capsule (0.4 mg total) by mouth daily after supper. 30 capsule 3   traZODone (DESYREL) 50 MG tablet Take 1 tablet (50 mg total) by mouth at bedtime as needed for sleep. 30 tablet 3    Results for orders placed or performed during the hospital encounter of 07/04/22 (from the past 48 hour(s))  Glucose, capillary     Status: None   Collection Time: 07/24/22  1:03 PM  Result Value Ref Range   Glucose-Capillary 98 70 - 99 mg/dL    Comment: Glucose reference range applies only to samples taken after fasting for at least 8 hours.   Comment 1 Notify RN    Comment 2 Document in Chart   Glucose, capillary     Status: Abnormal   Collection Time: 07/24/22  3:52 PM  Result Value Ref Range   Glucose-Capillary 179 (H) 70 - 99 mg/dL    Comment: Glucose reference range applies  only to samples taken after fasting for at least 8 hours.  Glucose, capillary     Status: Abnormal   Collection Time: 07/24/22  8:11 PM  Result Value Ref Range   Glucose-Capillary 145 (H) 70 - 99 mg/dL    Comment: Glucose reference range applies only to samples taken after fasting for at least 8 hours.  Glucose, capillary     Status: None   Collection Time: 07/25/22  7:09 AM  Result Value Ref Range   Glucose-Capillary 85 70 - 99 mg/dL    Comment: Glucose reference range applies only to samples taken after fasting for at least 8 hours.  Glucose, capillary     Status: None   Collection Time: 07/25/22 11:33 AM  Result Value Ref Range   Glucose-Capillary 86 70 - 99 mg/dL    Comment: Glucose reference range applies only to samples taken after fasting for at least 8 hours.  Glucose, capillary     Status: Abnormal   Collection Time: 07/25/22  3:53 PM  Result Value Ref Range   Glucose-Capillary 155 (H) 70 - 99 mg/dL    Comment: Glucose reference range applies only to samples taken after fasting for at least 8 hours.  Glucose, capillary     Status: None   Collection Time: 07/25/22  8:13 PM  Result Value Ref Range   Glucose-Capillary 84 70 - 99 mg/dL    Comment: Glucose reference range applies only to samples taken after fasting for at least 8 hours.  Glucose, capillary     Status: None   Collection Time: 07/26/22  7:02 AM  Result Value Ref Range   Glucose-Capillary 99 70 - 99 mg/dL    Comment: Glucose reference range applies only to samples taken after fasting for at least 8 hours.   No results found.  Review of Systems  Constitutional: Negative.   HENT: Negative.    Eyes: Negative.   Respiratory: Negative.    Cardiovascular: Negative.   Gastrointestinal: Negative.   Musculoskeletal: Negative.   Skin: Negative.   Neurological: Negative.   Psychiatric/Behavioral: Negative.    All other systems reviewed and are negative.   Blood pressure (!) 134/117, pulse 61, temperature 97.9 F  (36.6 C), resp. rate 18, height '6\' 2"'$  (1.88 m), weight 70.8 kg, SpO2 100 %. Physical Exam Vitals and nursing note reviewed.  Constitutional:      Appearance: He is well-developed.  HENT:     Head: Normocephalic  and atraumatic.  Eyes:     Conjunctiva/sclera: Conjunctivae normal.     Pupils: Pupils are equal, round, and reactive to light.  Cardiovascular:     Heart sounds: Normal heart sounds.  Pulmonary:     Effort: Pulmonary effort is normal.  Abdominal:     Palpations: Abdomen is soft.  Musculoskeletal:        General: Normal range of motion.     Cervical back: Normal range of motion.  Skin:    General: Skin is warm and dry.  Neurological:     General: No focal deficit present.     Mental Status: He is alert.  Psychiatric:        Mood and Affect: Mood normal.      Assessment/Plan Treatment today possibly Monday as well but almost certainly going to be ready for discharge next week  [name], MD 07/26/2022, 11:23 AM

## 2022-07-26 NOTE — Procedures (Signed)
ECT SERVICES Physician's Interval Evaluation & Treatment Note  Patient Identification: John [name] MRN:  [id number] Date of Evaluation:  07/26/2022 TX #: [id number]  MADRS:   MMSE:   P.E. Findings:  No change to physical exam  Psychiatric Interval Note:  Quite a bit more upbeat talkative smiling much more alert  Subjective:  Patient is a 81 y.o. male seen for evaluation for Electroconvulsive Therapy. Feeling better and not feeling depressed  Treatment Summary:   '[x]'$   Right Unilateral             '[]'$  Bilateral   % Energy : 0.3 ms 100%   Impedance: 2000 ohms  Seizure Energy Index: 2395 V squared  Postictal Suppression Index: 68%  Seizure Concordance Index: 90%  Medications  Pre Shock: Robinul 0.1 mg labetalol 10 mg ketamine 100 mg succinylcholine 100 mg  Post Shock: Versed 2 mg  Seizure Duration: 22 seconds EMG 49 seconds EEG   Comments: Follow-up on Monday  Lungs:  '[x]'$   Clear to auscultation               '[]'$  Other:   Heart:    '[x]'$   Regular rhythm             '[]'$  irregular rhythm    '[x]'$   Previous H&P reviewed, patient examined and there are NO CHANGES                 '[]'$   Previous H&P reviewed, patient examined and there are changes noted.   [name], MD 9/29/20231:58 PM

## 2022-07-26 NOTE — Progress Notes (Signed)
[facility] MD Progress Note  07/26/2022 2:11 PM John [name]  MRN:  [id number] Subjective: John [name] is seen on rounds this morning.  He continues to improve.  He is scheduled for ECT today.  He is much more verbal and walking more freely.  According to the nurses his wounds on his back and are healing.  He has been compliant with medications and no side effects.  He is going to get ECT on Monday, too . Principal Problem: Severe recurrent major depression without psychotic features ([place]) Diagnosis: Principal Problem:   Severe recurrent major depression without psychotic features ([place]) Active Problems:   Major depressive disorder, recurrent severe without psychotic features ([place])   Pressure injury of skin  Total Time spent with patient: 15 minutes  Past Psychiatric History:   Patient has a history of past depression repeated episodes throughout his life.  1 prior suicide attempt.  Was admitted to the hospital in mid May here with depression.  Was not showing significant improvement over time and was referred for ECT.  Patient received a few ECT treatments which generally appeared to be tolerated well.  This past weekend however he decompensated for unclear reasons at the very end of Sunday and prior to treatment on Monday was seen to be delirious with an arrhythmia.  Arrhythmia has stabilized.  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 06/2015 CT Head: abnl thickening of mid-dist R cervical ICA w/o narrowing - ? vasculitis and thrombosed/healing dissection; b. 01/2021 Carotid U/S: 1-39% bilat ICA stenoses.   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 Age 80 - struck in head w/ baseball --> s/p craniotomy.   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 Approx 2015 - eval in [place] - conservatively managed.    Past Surgical History:  Procedure Laterality Date   ANKLE ARTHROSCOPY Left    fracture   CHOLECYSTECTOMY  2007   [place]   left frontal craniotomy for epidural hematoma   HIP ARTHROPLASTY Right 04/07/2022   Procedure: ARTHROPLASTY BIPOLAR HIP (HEMIARTHROPLASTY);  Surgeon: Earnestine Leys, MD;  Location: [facility] ORS;  Service: Orthopedics;  Laterality: Right;   INSERTION OF MESH  01/02/2022   Procedure: INSERTION OF MESH;  Surgeon: Herbert Pun, MD;  Location: [facility] ORS;  Service: General;;   LIGAMENT REPAIR Right    thumb   RETINAL DETACHMENT SURGERY     TREATMENT FISTULA ANAL  2006   Family History:  Family History  Problem Relation Age of Onset   Osteoporosis Mother    Depression Mother    Prostate cancer Father    Hypertension Father    Diabetes Brother    Kidney disease Neg Hx     Social History:  Social History   Substance and Sexual Activity  Alcohol Use Not Currently  Social History   Substance and Sexual Activity  Drug Use No    Social History   Socioeconomic History   Marital status: Married    Spouse name: [name]   Number of children: 2   Years of education: Not on file   Highest education level: Master's degree (e.g., MA, MS, MEng, MEd, MSW, MBA)  Occupational History   Not on file  Tobacco Use   Smoking status: Never   Smokeless tobacco: Never  Vaping Use   Vaping Use: Never used  Substance and Sexual Activity   Alcohol use: Not Currently   Drug use: No   Sexual activity: Yes    Birth control/protection: None  Other Topics Concern   Not on file  Social History Narrative   Live at home with wife.    Social Determinants of  Health   Financial Resource Strain: Not on file  Food Insecurity: Food Insecurity Present (07/09/2022)   Hunger Vital Sign    Worried About Running Out of Food in the Last Year: Sometimes true    Ran Out of Food in the Last Year: Sometimes true  Transportation Needs: No Transportation Needs (07/09/2022)   PRAPARE - Hydrologist (Medical): No    Lack of Transportation (Non-Medical): No  Physical Activity: Not on file  Stress: Not on file  Social Connections: Not on file   Additional Social History:                         Sleep: Good  Appetite:  Good  Current Medications: Current Facility-Administered Medications  Medication Dose Route Frequency Provider Last Rate Last Admin   0.9 %  sodium chloride infusion  500 mL Intravenous Once Clapacs, John T, MD       0.9 %  sodium chloride infusion  500 mL Intravenous Once Clapacs, John T, MD       0.9 %  sodium chloride infusion  500 mL Intravenous Once Clapacs, John T, MD       0.9 %  sodium chloride infusion  500 mL Intravenous Once Clapacs, John T, MD       0.9 %  sodium chloride infusion  500 mL Intravenous Once Clapacs, John T, MD       acetaminophen (TYLENOL) tablet 650 mg  650 mg Oral Q6H PRN Patrecia Pour, NP   650 mg at 07/20/22 0902   alum & mag hydroxide-simeth (MAALOX/MYLANTA) 200-200-20 MG/5ML suspension 30 mL  30 mL Oral Q4H PRN Patrecia Pour, NP       ascorbic acid (VITAMIN C) tablet 500 mg  500 mg Oral BID Parks Ranger, DO   500 mg at 07/26/22 1329   aspirin EC tablet 81 mg  81 mg Oral Daily Patrecia Pour, NP   81 mg at 07/26/22 1327   buPROPion (WELLBUTRIN XL) 24 hr tablet 300 mg  300 mg Oral Daily Patrecia Pour, NP   300 mg at 07/26/22 1329   docusate sodium (COLACE) capsule 100 mg  100 mg Oral BID Patrecia Pour, NP   100 mg at 07/26/22 1328   glycopyrrolate (ROBINUL) 0.2 MG/ML injection            glycopyrrolate (ROBINUL) injection 0.1 mg  0.1 mg Intravenous Once  Clapacs, [name], MD       glycopyrrolate (ROBINUL) injection 0.1 mg  0.1 mg Intravenous Once Clapacs, [name], MD  glycopyrrolate (ROBINUL) injection 0.1 mg  0.1 mg Intravenous Once Clapacs, John T, MD       insulin aspart (novoLOG) injection 0-6 Units  0-6 Units Subcutaneous TID WC Parks Ranger, DO   1 Units at 07/25/22 1627   ketorolac (TORADOL) 30 MG/ML injection 30 mg  30 mg Intravenous Once Clapacs, [name], MD       liver oil-zinc oxide (DESITIN) 40 % ointment   Topical BID Parks Ranger, DO   Given at 07/26/22 0857   magnesium hydroxide (MILK OF MAGNESIA) suspension 30 mL  30 mL Oral Daily PRN Patrecia Pour, NP       methocarbamol (ROBAXIN) tablet 500 mg  500 mg Oral Q6H PRN Patrecia Pour, NP       midazolam (VERSED) injection 2 mg  2 mg Intravenous Once Clapacs, John T, MD       midazolam (VERSED) injection 2 mg  2 mg Intravenous Once Clapacs, [name], MD       midazolam (VERSED) injection 2 mg  2 mg Intravenous Once Clapacs, John T, MD       modafinil (PROVIGIL) tablet 100 mg  100 mg Oral QPC breakfast Parks Ranger, DO   100 mg at 07/26/22 1330   multivitamin with minerals tablet 1 tablet  1 tablet Oral Daily Parks Ranger, DO   1 tablet at 07/26/22 1326   OLANZapine (ZYPREXA) tablet 10 mg  10 mg Oral QHS Parks Ranger, DO   10 mg at 07/25/22 2143   polyethylene glycol (MIRALAX / GLYCOLAX) packet 17 g  17 g Oral Daily PRN Patrecia Pour, NP       protein supplement (ENSURE MAX) liquid  11 oz Oral BID Parks Ranger, DO   11 oz at 07/26/22 1344   senna (SENOKOT) tablet 8.6 mg  1 tablet Oral BID Patrecia Pour, NP   8.6 mg at 07/26/22 1333   sertraline (ZOLOFT) tablet 50 mg  50 mg Oral QHS Patrecia Pour, NP   50 mg at 07/25/22 2143   traZODone (DESYREL) tablet 50 mg  50 mg Oral QHS Parks Ranger, DO   50 mg at 07/25/22 2143    Lab Results:  Results for orders placed or performed during the hospital encounter  of 07/04/22 (from the past 48 hour(s))  Glucose, capillary     Status: Abnormal   Collection Time: 07/24/22  3:52 PM  Result Value Ref Range   Glucose-Capillary 179 (H) 70 - 99 mg/dL    Comment: Glucose reference range applies only to samples taken after fasting for at least 8 hours.  Glucose, capillary     Status: Abnormal   Collection Time: 07/24/22  8:11 PM  Result Value Ref Range   Glucose-Capillary 145 (H) 70 - 99 mg/dL    Comment: Glucose reference range applies only to samples taken after fasting for at least 8 hours.  Glucose, capillary     Status: None   Collection Time: 07/25/22  7:09 AM  Result Value Ref Range   Glucose-Capillary 85 70 - 99 mg/dL    Comment: Glucose reference range applies only to samples taken after fasting for at least 8 hours.  Glucose, capillary     Status: None   Collection Time: 07/25/22 11:33 AM  Result Value Ref Range   Glucose-Capillary 86 70 - 99 mg/dL    Comment: Glucose reference range applies only to samples taken after fasting for at least 8  hours.  Glucose, capillary     Status: Abnormal   Collection Time: 07/25/22  3:53 PM  Result Value Ref Range   Glucose-Capillary 155 (H) 70 - 99 mg/dL    Comment: Glucose reference range applies only to samples taken after fasting for at least 8 hours.  Glucose, capillary     Status: None   Collection Time: 07/25/22  8:13 PM  Result Value Ref Range   Glucose-Capillary 84 70 - 99 mg/dL    Comment: Glucose reference range applies only to samples taken after fasting for at least 8 hours.  Glucose, capillary     Status: None   Collection Time: 07/26/22  7:02 AM  Result Value Ref Range   Glucose-Capillary 99 70 - 99 mg/dL    Comment: Glucose reference range applies only to samples taken after fasting for at least 8 hours.  Glucose, capillary     Status: Abnormal   Collection Time: 07/26/22  1:15 PM  Result Value Ref Range   Glucose-Capillary 67 (L) 70 - 99 mg/dL    Comment: Glucose reference range  applies only to samples taken after fasting for at least 8 hours.    Blood Alcohol level:  Lab Results  Component Value Date   ETH <10 07/02/2022   ETH <10 14/43/1540    Metabolic Disorder Labs: Lab Results  Component Value Date   HGBA1C 5.6 03/19/2022   MPG 114.02 03/19/2022   No results found for: "PROLACTIN" Lab Results  Component Value Date   CHOL 131 03/19/2022   TRIG 92 03/19/2022   HDL 33 (L) 03/19/2022   CHOLHDL 4.0 03/19/2022   VLDL 18 03/19/2022   LDLCALC 80 03/19/2022    Physical Findings: AIMS:  , ,  ,  ,    CIWA:    COWS:     Musculoskeletal: Strength & Muscle Tone: within normal limits Gait & Station: unsteady Patient leans: N/A  Psychiatric Specialty Exam:  Presentation  General Appearance:  Appropriate for Environment; Casual  Eye Contact: Fleeting  Speech: Garbled  Speech Volume: Decreased  Handedness: Right   Mood and Affect  Mood: Anxious; Depressed  Affect: Flat   Thought Process  Thought Processes: Linear; Coherent; Goal Directed  Descriptions of Associations:Loose  Orientation:Partial  Thought Content:Scattered  History of Schizophrenia/Schizoaffective disorder:No  Duration of Psychotic Symptoms:No data recorded Hallucinations:No data recorded Ideas of Reference:None  Suicidal Thoughts:No data recorded Homicidal Thoughts:No data recorded  Sensorium  Memory: Recent Poor; Remote Poor  Judgment: Poor  Insight: Fair   Community education officer  Concentration: Fair  Attention Span: Poor  Recall: Poor  Fund of Knowledge: Poor  Language: Poor   Psychomotor Activity  Psychomotor Activity:No data recorded  Assets  Assets: Financial Resources/Insurance   Sleep  Sleep:No data recorded   Physical Exam: Physical Exam Vitals and nursing note reviewed.  Constitutional:      Appearance: Normal appearance. He is normal weight.  Neurological:     General: No focal deficit present.      Mental Status: He is alert and oriented to person, place, and time.  Psychiatric:        Attention and Perception: Attention and perception normal.        Mood and Affect: Mood and affect normal.        Speech: Speech normal.        Behavior: Behavior normal. Behavior is cooperative.        Thought Content: Thought content normal.        Cognition  and Memory: Cognition is impaired. Memory is impaired.        Judgment: Judgment normal.    Review of Systems  Constitutional: Negative.   HENT: Negative.    Eyes: Negative.   Respiratory: Negative.    Cardiovascular: Negative.   Gastrointestinal: Negative.   Genitourinary: Negative.   Musculoskeletal: Negative.   Skin: Negative.   Neurological: Negative.   Endo/Heme/Allergies: Negative.   Psychiatric/Behavioral:  Positive for depression.    Blood pressure (!) 140/84, pulse 68, temperature 98.4 F (36.9 C), resp. rate 19, height '6\' 2"'$  (1.88 m), weight 70.8 kg, SpO2 99 %. Body mass index is 20.03 kg/m.   Treatment Plan Summary: Daily contact with patient to assess and evaluate symptoms and progress in treatment, Medication management, and Plan continue current medications.  Parks Ranger, DO 07/26/2022, 2:11 PM

## 2022-07-26 NOTE — Addendum Note (Signed)
Addendum  created 07/26/22 1200 by Esaw Grandchild, CRNA   Intraprocedure Staff edited

## 2022-07-26 NOTE — Addendum Note (Signed)
Addendum  created 07/26/22 1227 by Esaw Grandchild, CRNA   Attestation recorded in Intraprocedure, Clinical Note Signed, Flowsheet accepted, Intraprocedure Attestations filed, Intraprocedure Event edited, Intraprocedure Meds edited, Intraprocedure Staff edited, Patient device added, Patient device removed

## 2022-07-26 NOTE — Progress Notes (Signed)
Patient is alert and oriented times 4. Mood and affect appropriate. Patient rates pain as 0/10. He denies SI, HI, and AVH. Patient does endorse feelings of depression at this time. Patient states he slept well last night. Evening medicines administered whole by mouth without difficulty. Patient ate snack in day room; appetite was fair. Patient remains on unit with Q15 minute checks in place.

## 2022-07-27 DIAGNOSIS — F332 Major depressive disorder, recurrent severe without psychotic features: Secondary | ICD-10-CM | POA: Diagnosis not present

## 2022-07-27 LAB — GLUCOSE, CAPILLARY
Glucose-Capillary: 91 mg/dL (ref 70–99)
Glucose-Capillary: 144 mg/dL — ABNORMAL HIGH (ref 70–99)
Glucose-Capillary: 105 mg/dL — ABNORMAL HIGH (ref 70–99)
Glucose-Capillary: 89 mg/dL (ref 70–99)

## 2022-07-27 NOTE — Progress Notes (Signed)
Patient no longer needs a 1:1 sitter at night per doctor's order.

## 2022-07-27 NOTE — Progress Notes (Signed)
Pt experienced an episode of incontinence of urine while in the dayroom.  Patient states experiencing a sudden urge to urinate and the inability to control urination.  Patient was assisted to ambulate to his room and to the bathroom.  Patient assisted to clean himself up using wet wipes and washcloths.  Patient was provided with clean scrubs, briefs and socks and assisted with dressing.  Patient reminded to use his walker to ambulate during the night.

## 2022-07-27 NOTE — Progress Notes (Signed)
Patient refused Desitin cream x 2 today.  Patient stated, "I really don't feel its necessary."   RN encouraged patient to let staff know if he changes his mind.

## 2022-07-27 NOTE — Progress Notes (Signed)
[facility] MD Progress Note  07/27/2022 6:07 PM John [name]  MRN:  [id number] Subjective: John [name] is seen on rounds today.  His mood and affect are improving.  He is interacting better with staff and peers.  He has no complaints and no issues.  Principal Problem: Severe recurrent major depression without psychotic features ([place]) Diagnosis: Principal Problem:   Severe recurrent major depression without psychotic features ([place]) Active Problems:   Major depressive disorder, recurrent severe without psychotic features ([place])   Pressure injury of skin  Total Time spent with patient: 15 minutes  Past Psychiatric History:  Patient has a history of past depression repeated episodes throughout his life.  1 prior suicide attempt.  Was admitted to the hospital in mid May here with depression.  Was not showing significant improvement over time and was referred for ECT.  Patient received a few ECT treatments which generally appeared to be tolerated well.  This past weekend however he decompensated for unclear reasons at the very end of Sunday and prior to treatment on Monday was seen to be delirious with an arrhythmia.  Arrhythmia has stabilized.  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 06/2015 CT Head: abnl thickening of mid-dist R cervical ICA w/o narrowing - ? vasculitis and thrombosed/healing dissection; b. 01/2021 Carotid U/S: 1-39% bilat ICA stenoses.   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 Age 46 - struck in head w/ baseball --> s/p craniotomy.   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 Approx 2015 - eval in [place] - conservatively managed.    Past Surgical History:  Procedure Laterality Date   ANKLE ARTHROSCOPY Left    fracture   CHOLECYSTECTOMY  2007   [place]   left frontal craniotomy for epidural hematoma   HIP ARTHROPLASTY Right 04/07/2022   Procedure: ARTHROPLASTY BIPOLAR HIP (HEMIARTHROPLASTY);  Surgeon: Earnestine Leys, MD;  Location: [facility] ORS;  Service: Orthopedics;  Laterality: Right;   INSERTION OF MESH  01/02/2022   Procedure: INSERTION OF MESH;  Surgeon: Herbert Pun, MD;  Location: [facility] ORS;  Service: General;;   LIGAMENT REPAIR Right    thumb   RETINAL DETACHMENT SURGERY     TREATMENT FISTULA ANAL  2006   Family History:  Family History  Problem Relation Age of Onset   Osteoporosis Mother    Depression Mother    Prostate cancer Father    Hypertension Father    Diabetes Brother    Kidney disease Neg Hx     Social History:  Social History   Substance and Sexual Activity  Alcohol Use Not Currently     Social History   Substance and Sexual Activity  Drug Use No    Social History   Socioeconomic History   Marital status: Married    Spouse name:  [name]   Number of children: 2   Years of education: Not on file   Highest education level: Master's degree (e.g., MA, MS, MEng, MEd, MSW, MBA)  Occupational History   Not on file  Tobacco Use   Smoking status: Never   Smokeless tobacco: Never  Vaping Use   Vaping Use: Never used  Substance and Sexual Activity   Alcohol use: Not Currently   Drug use: No   Sexual activity: Yes    Birth control/protection: None  Other Topics Concern   Not on file  Social History Narrative   Live at home with wife.    Social Determinants of Health   Financial Resource Strain: Not on file  Food Insecurity: Food Insecurity Present (07/09/2022)   Hunger Vital Sign    Worried About Running Out of Food in  the Last Year: Sometimes true    Ran Out of Food in the Last Year: Sometimes true  Transportation Needs: No Transportation Needs (07/09/2022)   PRAPARE - Hydrologist (Medical): No    Lack of Transportation (Non-Medical): No  Physical Activity: Not on file  Stress: Not on file  Social Connections: Not on file   Additional Social History:                         Sleep: Good  Appetite:  Good  Current Medications: Current Facility-Administered Medications  Medication Dose Route Frequency Provider Last Rate Last Admin   0.9 %  sodium chloride infusion  500 mL Intravenous Once Clapacs, John T, MD       0.9 %  sodium chloride infusion  500 mL Intravenous Once Clapacs, John T, MD       0.9 %  sodium chloride infusion  500 mL Intravenous Once Clapacs, John T, MD       0.9 %  sodium chloride infusion  500 mL Intravenous Once Clapacs, John T, MD       0.9 %  sodium chloride infusion  500 mL Intravenous Once Clapacs, John T, MD       acetaminophen (TYLENOL) tablet 650 mg  650 mg Oral Q6H PRN Patrecia Pour, NP   650 mg at 07/20/22 0902   alum & mag hydroxide-simeth (MAALOX/MYLANTA) 200-200-20 MG/5ML suspension 30 mL  30 mL Oral Q4H PRN Patrecia Pour, NP       ascorbic acid (VITAMIN C) tablet 500 mg  500 mg Oral BID Parks Ranger, DO   500 mg at 07/27/22 2703   aspirin EC tablet 81 mg  81 mg Oral Daily Patrecia Pour, NP   81 mg at 07/27/22 0853   buPROPion (WELLBUTRIN XL) 24 hr tablet 300 mg  300 mg Oral Daily Patrecia Pour, NP   300 mg at 07/27/22 5009   docusate sodium (COLACE) capsule 100 mg  100 mg Oral BID Patrecia Pour, NP   100 mg at 07/27/22 0853   glycopyrrolate (ROBINUL) injection 0.1 mg  0.1 mg Intravenous Once Clapacs, [name], MD       glycopyrrolate (ROBINUL) injection 0.1 mg  0.1 mg Intravenous Once Clapacs, [name], MD       glycopyrrolate (ROBINUL) injection 0.1 mg  0.1 mg Intravenous Once Clapacs, John T, MD       insulin  aspart (novoLOG) injection 0-6 Units  0-6 Units Subcutaneous TID WC Parks Ranger, DO   1 Units at 07/26/22 1650   ketorolac (  TORADOL) 30 MG/ML injection 30 mg  30 mg Intravenous Once Clapacs, John T, MD       liver oil-zinc oxide (DESITIN) 40 % ointment   Topical BID Parks Ranger, DO   Given at 07/26/22 2116   magnesium hydroxide (MILK OF MAGNESIA) suspension 30 mL  30 mL Oral Daily PRN Patrecia Pour, NP       methocarbamol (ROBAXIN) tablet 500 mg  500 mg Oral Q6H PRN Patrecia Pour, NP       midazolam (VERSED) injection 2 mg  2 mg Intravenous Once Clapacs, John T, MD       midazolam (VERSED) injection 2 mg  2 mg Intravenous Once Clapacs, John T, MD       midazolam (VERSED) injection 2 mg  2 mg Intravenous Once Clapacs, John T, MD       modafinil (PROVIGIL) tablet 100 mg  100 mg Oral QPC breakfast Parks Ranger, DO   100 mg at 07/27/22 4128   multivitamin with minerals tablet 1 tablet  1 tablet Oral Daily Parks Ranger, DO   1 tablet at 07/27/22 0853   OLANZapine (ZYPREXA) tablet 10 mg  10 mg Oral QHS Parks Ranger, DO   10 mg at 07/26/22 2117   polyethylene glycol (MIRALAX / GLYCOLAX) packet 17 g  17 g Oral Daily PRN Patrecia Pour, NP       protein supplement (ENSURE MAX) liquid  11 oz Oral BID Parks Ranger, DO   11 oz at 07/27/22 0854   senna (SENOKOT) tablet 8.6 mg  1 tablet Oral BID Patrecia Pour, NP   8.6 mg at 07/27/22 0854   sertraline (ZOLOFT) tablet 50 mg  50 mg Oral QHS Patrecia Pour, NP   50 mg at 07/26/22 2117   traZODone (DESYREL) tablet 50 mg  50 mg Oral QHS Parks Ranger, DO   50 mg at 07/26/22 2117    Lab Results:  Results for orders placed or performed during the hospital encounter of 07/04/22 (from the past 48 hour(s))  Glucose, capillary     Status: None   Collection Time: 07/25/22  8:13 PM  Result Value Ref Range   Glucose-Capillary 84 70 - 99 mg/dL    Comment: Glucose reference range applies  only to samples taken after fasting for at least 8 hours.  Glucose, capillary     Status: None   Collection Time: 07/26/22  7:02 AM  Result Value Ref Range   Glucose-Capillary 99 70 - 99 mg/dL    Comment: Glucose reference range applies only to samples taken after fasting for at least 8 hours.  Glucose, capillary     Status: Abnormal   Collection Time: 07/26/22  1:15 PM  Result Value Ref Range   Glucose-Capillary 67 (L) 70 - 99 mg/dL    Comment: Glucose reference range applies only to samples taken after fasting for at least 8 hours.  Glucose, capillary     Status: Abnormal   Collection Time: 07/26/22  4:40 PM  Result Value Ref Range   Glucose-Capillary 195 (H) 70 - 99 mg/dL    Comment: Glucose reference range applies only to samples taken after fasting for at least 8 hours.  Glucose, capillary     Status: Abnormal   Collection Time: 07/26/22  8:25 PM  Result Value Ref Range   Glucose-Capillary 101 (H) 70 - 99 mg/dL    Comment: Glucose reference range applies only to samples taken after  fasting for at least 8 hours.  Glucose, capillary     Status: None   Collection Time: 07/27/22  7:54 AM  Result Value Ref Range   Glucose-Capillary 91 70 - 99 mg/dL    Comment: Glucose reference range applies only to samples taken after fasting for at least 8 hours.  Glucose, capillary     Status: Abnormal   Collection Time: 07/27/22 11:39 AM  Result Value Ref Range   Glucose-Capillary 144 (H) 70 - 99 mg/dL    Comment: Glucose reference range applies only to samples taken after fasting for at least 8 hours.  Glucose, capillary     Status: Abnormal   Collection Time: 07/27/22  4:24 PM  Result Value Ref Range   Glucose-Capillary 105 (H) 70 - 99 mg/dL    Comment: Glucose reference range applies only to samples taken after fasting for at least 8 hours.    Blood Alcohol level:  Lab Results  Component Value Date   ETH <10 07/02/2022   ETH <10 60/63/0160    Metabolic Disorder Labs: Lab Results   Component Value Date   HGBA1C 5.6 03/19/2022   MPG 114.02 03/19/2022   No results found for: "PROLACTIN" Lab Results  Component Value Date   CHOL 131 03/19/2022   TRIG 92 03/19/2022   HDL 33 (L) 03/19/2022   CHOLHDL 4.0 03/19/2022   VLDL 18 03/19/2022   LDLCALC 80 03/19/2022    Physical Findings: AIMS:  , ,  ,  ,    CIWA:    COWS:     Musculoskeletal: Strength & Muscle Tone: within normal limits Gait & Station: unsteady Patient leans: N/A  Psychiatric Specialty Exam:  Presentation  General Appearance:  Appropriate for Environment; Casual  Eye Contact: Fleeting  Speech: Garbled  Speech Volume: Decreased  Handedness: Right   Mood and Affect  Mood: Anxious; Depressed  Affect: Flat   Thought Process  Thought Processes: Linear; Coherent; Goal Directed  Descriptions of Associations:Loose  Orientation:Partial  Thought Content:Scattered  History of Schizophrenia/Schizoaffective disorder:No  Duration of Psychotic Symptoms:No data recorded Hallucinations:No data recorded Ideas of Reference:None  Suicidal Thoughts:No data recorded Homicidal Thoughts:No data recorded  Sensorium  Memory: Recent Poor; Remote Poor  Judgment: Poor  Insight: Fair   Community education officer  Concentration: Fair  Attention Span: Poor  Recall: Poor  Fund of Knowledge: Poor  Language: Poor   Psychomotor Activity  Psychomotor Activity:No data recorded  Assets  Assets: Financial Resources/Insurance   Sleep  Sleep:No data recorded   Blood pressure 119/79, pulse 74, temperature 99.2 F (37.3 C), temperature source Oral, resp. rate 18, height '6\' 2"'$  (1.88 m), weight 70.8 kg, SpO2 99 %. Body mass index is 20.03 kg/m.   Treatment Plan Summary: Daily contact with patient to assess and evaluate symptoms and progress in treatment, Medication management, and Plan continue current medications.  Parks Ranger, DO 07/27/2022, 6:07 PM

## 2022-07-27 NOTE — Plan of Care (Signed)
  Problem: Activity: Goal: Risk for activity intolerance will decrease Outcome: Progressing   Problem: Nutrition: Goal: Adequate nutrition will be maintained Outcome: Progressing   Problem: Activity: Goal: Interest or engagement in activities will improve Outcome: Progressing   Problem: Safety: Goal: Periods of time without injury will increase Outcome: Progressing   Problem: Activity: Goal: Interest or engagement in leisure activities will improve Outcome: Progressing

## 2022-07-27 NOTE — Progress Notes (Signed)
Patient is alert and oriented times 3. Mood and affect appropriate. Patient rates pain as 2/10. He denies SI, HI, and AVH. Patient does endorse feelings of depression at this time. Patient states he slept well last night. Evening medicines administered whole by mouth without difficulty. Patient ate snack in day room along with Ensure; appetite was fair. Patient remains on unit with Q15 minute checks in place. Patient is no longer on 1:1 sitter at night observation per MD orders.  Patient instructed to sit on the side of the bed for a minute or two to equalize his blood pressure before standing up to use the bathroom at night.  Patient also instructed to always use the walker at night.  Patient states that he believes he no longer needs his walker.  Verbal promise to use the walker obtained by this RN.

## 2022-07-27 NOTE — Progress Notes (Signed)
Patient went to the courtyard for fresh air.

## 2022-07-27 NOTE — Progress Notes (Signed)
Patient alert and oriented x 4.  Patient has flat affect, but brightens with interaction.  Patient denies SI/HI and AVH.  Endorses mild anxiety and depression.  Patient is speaking is complete sentences and initiating conversations.  Patient asked questions about which medications he was receiving and why they were prescribed.  Patient stated he did not sleep well, but had a good appetite this morning. Patient has been ambulatory with front wheel walker.   Patient has showered with minimal assistance from NT. Patient complaint with medications.  15 min checks in place.  Patient remains safe on the unit.  Appropriative interaction with peers in the milieu.

## 2022-07-28 DIAGNOSIS — F332 Major depressive disorder, recurrent severe without psychotic features: Secondary | ICD-10-CM | POA: Diagnosis not present

## 2022-07-28 LAB — GLUCOSE, CAPILLARY
Glucose-Capillary: 91 mg/dL (ref 70–99)
Glucose-Capillary: 117 mg/dL — ABNORMAL HIGH (ref 70–99)
Glucose-Capillary: 134 mg/dL — ABNORMAL HIGH (ref 70–99)
Glucose-Capillary: 126 mg/dL — ABNORMAL HIGH (ref 70–99)

## 2022-07-28 NOTE — Progress Notes (Signed)
[facility] MD Progress Note  07/28/2022 5:07 PM John [name]  MRN:  [id number] Subjective: Kaniel is seen on rounds.  He states that he is doing better.  He is smiling.  He has ECT tomorrow.  Should be able to discharge from by Wednesday.  Principal Problem: Severe recurrent major depression without psychotic features ([place]) Diagnosis: Principal Problem:   Severe recurrent major depression without psychotic features ([place]) Active Problems:   Major depressive disorder, recurrent severe without psychotic features ([place])   Pressure injury of skin  Total Time spent with patient: 15 minutes  Past Psychiatric History:  Patient has a history of past depression repeated episodes throughout his life.  1 prior suicide attempt.  Was admitted to the hospital in mid May here with depression.  Was not showing significant improvement over time and was referred for ECT.  Patient received a few ECT treatments which generally appeared to be tolerated well.  This past weekend however he decompensated for unclear reasons at the very end of Sunday and prior to treatment on Monday was seen to be delirious with an arrhythmia.  Arrhythmia has stabilized.  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 06/2015 CT Head: abnl thickening of mid-dist R cervical ICA w/o narrowing - ? vasculitis and thrombosed/healing dissection; b. 01/2021 Carotid U/S: 1-39% bilat ICA stenoses.   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 Age 6 - struck in head w/ baseball --> s/p craniotomy.   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 Approx 2015 - eval in [place] - conservatively managed.    Past Surgical History:  Procedure Laterality Date   ANKLE ARTHROSCOPY Left    fracture   CHOLECYSTECTOMY  2007   [place]   left frontal craniotomy for epidural hematoma   HIP ARTHROPLASTY Right 04/07/2022   Procedure: ARTHROPLASTY BIPOLAR HIP (HEMIARTHROPLASTY);  Surgeon: Earnestine Leys, MD;  Location: [facility] ORS;  Service: Orthopedics;  Laterality: Right;   INSERTION OF MESH  01/02/2022   Procedure: INSERTION OF MESH;  Surgeon: Herbert Pun, MD;  Location: [facility] ORS;  Service: General;;   LIGAMENT REPAIR Right    thumb   RETINAL DETACHMENT SURGERY     TREATMENT FISTULA ANAL  2006   Family History:  Family History  Problem Relation Age of Onset   Osteoporosis Mother    Depression Mother    Prostate cancer Father    Hypertension Father    Diabetes Brother    Kidney disease Neg Hx     Social History:  Social History   Substance and Sexual Activity  Alcohol Use Not Currently     Social History   Substance and Sexual Activity  Drug Use No    Social History   Socioeconomic History   Marital status: Married    Spouse  name: [name]   Number of children: 2   Years of education: Not on file   Highest education level: Master's degree (e.g., MA, MS, MEng, MEd, MSW, MBA)  Occupational History   Not on file  Tobacco Use   Smoking status: Never   Smokeless tobacco: Never  Vaping Use   Vaping Use: Never used  Substance and Sexual Activity   Alcohol use: Not Currently   Drug use: No   Sexual activity: Yes    Birth control/protection: None  Other Topics Concern   Not on file  Social History Narrative   Live at home with wife.    Social Determinants of Health   Financial Resource Strain: Not on file  Food Insecurity: Food Insecurity Present (07/09/2022)   Hunger Vital Sign    Worried About Running Out of Food in the  Last Year: Sometimes true    Ran Out of Food in the Last Year: Sometimes true  Transportation Needs: No Transportation Needs (07/09/2022)   PRAPARE - Hydrologist (Medical): No    Lack of Transportation (Non-Medical): No  Physical Activity: Not on file  Stress: Not on file  Social Connections: Not on file   Additional Social History:                         Sleep: Good  Appetite:  Good  Current Medications: Current Facility-Administered Medications  Medication Dose Route Frequency Provider Last Rate Last Admin   0.9 %  sodium chloride infusion  500 mL Intravenous Once Clapacs, John T, MD       0.9 %  sodium chloride infusion  500 mL Intravenous Once Clapacs, John T, MD       0.9 %  sodium chloride infusion  500 mL Intravenous Once Clapacs, John T, MD       0.9 %  sodium chloride infusion  500 mL Intravenous Once Clapacs, John T, MD       0.9 %  sodium chloride infusion  500 mL Intravenous Once Clapacs, [name], MD       acetaminophen (TYLENOL) tablet 650 mg  650 mg Oral Q6H PRN Patrecia Pour, NP   650 mg at 07/20/22 0902   alum & mag hydroxide-simeth (MAALOX/MYLANTA) 200-200-20 MG/5ML suspension 30 mL  30 mL Oral Q4H PRN Patrecia Pour, NP       ascorbic acid (VITAMIN C) tablet 500 mg  500 mg Oral BID Parks Ranger, DO   500 mg at 07/28/22 0900   aspirin EC tablet 81 mg  81 mg Oral Daily Patrecia Pour, NP   81 mg at 07/28/22 0900   buPROPion (WELLBUTRIN XL) 24 hr tablet 300 mg  300 mg Oral Daily Patrecia Pour, NP   300 mg at 07/28/22 8502   docusate sodium (COLACE) capsule 100 mg  100 mg Oral BID Patrecia Pour, NP   100 mg at 07/28/22 0900   glycopyrrolate (ROBINUL) injection 0.1 mg  0.1 mg Intravenous Once Clapacs, [name], MD       glycopyrrolate (ROBINUL) injection 0.1 mg  0.1 mg Intravenous Once Clapacs, [name], MD       glycopyrrolate (ROBINUL) injection 0.1 mg  0.1 mg Intravenous Once Clapacs, John T, MD       insulin  aspart (novoLOG) injection 0-6 Units  0-6 Units Subcutaneous TID WC Parks Ranger, DO   1 Units at 07/26/22 1650  ketorolac (TORADOL) 30 MG/ML injection 30 mg  30 mg Intravenous Once Clapacs, John T, MD       liver oil-zinc oxide (DESITIN) 40 % ointment   Topical BID Parks Ranger, DO   Given at 07/28/22 1430   magnesium hydroxide (MILK OF MAGNESIA) suspension 30 mL  30 mL Oral Daily PRN Patrecia Pour, NP       methocarbamol (ROBAXIN) tablet 500 mg  500 mg Oral Q6H PRN Patrecia Pour, NP       midazolam (VERSED) injection 2 mg  2 mg Intravenous Once Clapacs, John T, MD       midazolam (VERSED) injection 2 mg  2 mg Intravenous Once Clapacs, John T, MD       midazolam (VERSED) injection 2 mg  2 mg Intravenous Once Clapacs, John T, MD       modafinil (PROVIGIL) tablet 100 mg  100 mg Oral QPC breakfast Parks Ranger, DO   100 mg at 07/28/22 0900   multivitamin with minerals tablet 1 tablet  1 tablet Oral Daily Parks Ranger, DO   1 tablet at 07/28/22 0900   OLANZapine (ZYPREXA) tablet 10 mg  10 mg Oral QHS Parks Ranger, DO   10 mg at 07/27/22 2114   polyethylene glycol (MIRALAX / GLYCOLAX) packet 17 g  17 g Oral Daily PRN Patrecia Pour, NP       protein supplement (ENSURE MAX) liquid  11 oz Oral BID Parks Ranger, DO   11 oz at 07/28/22 0900   senna (SENOKOT) tablet 8.6 mg  1 tablet Oral BID Patrecia Pour, NP   8.6 mg at 07/28/22 0859   sertraline (ZOLOFT) tablet 50 mg  50 mg Oral QHS Patrecia Pour, NP   50 mg at 07/27/22 2114   traZODone (DESYREL) tablet 50 mg  50 mg Oral QHS Parks Ranger, DO   50 mg at 07/27/22 2114    Lab Results:  Results for orders placed or performed during the hospital encounter of 07/04/22 (from the past 48 hour(s))  Glucose, capillary     Status: Abnormal   Collection Time: 07/26/22  8:25 PM  Result Value Ref Range   Glucose-Capillary 101 (H) 70 - 99 mg/dL    Comment: Glucose reference  range applies only to samples taken after fasting for at least 8 hours.  Glucose, capillary     Status: None   Collection Time: 07/27/22  7:54 AM  Result Value Ref Range   Glucose-Capillary 91 70 - 99 mg/dL    Comment: Glucose reference range applies only to samples taken after fasting for at least 8 hours.  Glucose, capillary     Status: Abnormal   Collection Time: 07/27/22 11:39 AM  Result Value Ref Range   Glucose-Capillary 144 (H) 70 - 99 mg/dL    Comment: Glucose reference range applies only to samples taken after fasting for at least 8 hours.  Glucose, capillary     Status: Abnormal   Collection Time: 07/27/22  4:24 PM  Result Value Ref Range   Glucose-Capillary 105 (H) 70 - 99 mg/dL    Comment: Glucose reference range applies only to samples taken after fasting for at least 8 hours.  Glucose, capillary     Status: None   Collection Time: 07/27/22  8:06 PM  Result Value Ref Range   Glucose-Capillary 89 70 - 99 mg/dL    Comment: Glucose reference range applies only to samples taken after  fasting for at least 8 hours.  Glucose, capillary     Status: None   Collection Time: 07/28/22  7:42 AM  Result Value Ref Range   Glucose-Capillary 91 70 - 99 mg/dL    Comment: Glucose reference range applies only to samples taken after fasting for at least 8 hours.  Glucose, capillary     Status: Abnormal   Collection Time: 07/28/22 11:27 AM  Result Value Ref Range   Glucose-Capillary 117 (H) 70 - 99 mg/dL    Comment: Glucose reference range applies only to samples taken after fasting for at least 8 hours.  Glucose, capillary     Status: Abnormal   Collection Time: 07/28/22  4:14 PM  Result Value Ref Range   Glucose-Capillary 134 (H) 70 - 99 mg/dL    Comment: Glucose reference range applies only to samples taken after fasting for at least 8 hours.    Blood Alcohol level:  Lab Results  Component Value Date   ETH <10 07/02/2022   ETH <10 34/74/2595    Metabolic Disorder Labs: Lab  Results  Component Value Date   HGBA1C 5.6 03/19/2022   MPG 114.02 03/19/2022   No results found for: "PROLACTIN" Lab Results  Component Value Date   CHOL 131 03/19/2022   TRIG 92 03/19/2022   HDL 33 (L) 03/19/2022   CHOLHDL 4.0 03/19/2022   VLDL 18 03/19/2022   LDLCALC 80 03/19/2022    Physical Findings: AIMS:  , ,  ,  ,    CIWA:    COWS:     Musculoskeletal: Strength & Muscle Tone: within normal limits Gait & Station: normal Patient leans: N/A  Psychiatric Specialty Exam:  Presentation  General Appearance:  Appropriate for Environment; Casual  Eye Contact: Fleeting  Speech: Garbled  Speech Volume: Decreased  Handedness: Right   Mood and Affect  Mood: Anxious; Depressed  Affect: Flat   Thought Process  Thought Processes: Linear; Coherent; Goal Directed  Descriptions of Associations:Loose  Orientation:Partial  Thought Content:Scattered  History of Schizophrenia/Schizoaffective disorder:No  Duration of Psychotic Symptoms:No data recorded Hallucinations:No data recorded Ideas of Reference:None  Suicidal Thoughts:No data recorded Homicidal Thoughts:No data recorded  Sensorium  Memory: Recent Poor; Remote Poor  Judgment: Poor  Insight: Fair   Community education officer  Concentration: Fair  Attention Span: Poor  Recall: Poor  Fund of Knowledge: Poor  Language: Poor   Psychomotor Activity  Psychomotor Activity:No data recorded  Assets  Assets: Financial Resources/Insurance   Sleep  Sleep:No data recorded    Blood pressure 110/71, pulse 96, temperature (!) 97.5 F (36.4 C), temperature source Oral, resp. rate 17, height '6\' 2"'$  (1.88 m), weight 70.8 kg, SpO2 100 %. Body mass index is 20.03 kg/m.   Treatment Plan Summary: Daily contact with patient to assess and evaluate symptoms and progress in treatment, Medication management, and Plan continue current medications.  Parks Ranger, DO 07/28/2022, 5:07  PM

## 2022-07-28 NOTE — [facility] Group Notes (Signed)
LCSW Wellness Group Note   07/28/2022 2:00pm  Type of Group and Topic: Psychoeducational Group:  Wellness  Participation Level:  minimal  Description of Group  Wellness group introduces the topic and its focus on developing healthy habits across the spectrum and its relationship to a decrease in hospital admissions.  Six areas of wellness are discussed: physical, social spiritual, intellectual, occupational, and emotional.  Patients are asked to consider their current wellness habits and to identify areas of wellness where they are interested and able to focus on improvements.    Therapeutic Goals Patients will understand components of wellness and how they can positively impact overall health.  Patients will identify areas of wellness where they have developed good habits. Patients will identify areas of wellness where they would like to make improvements.    Summary of Patient Progress: pt came to group late, did appear to be paying attention.  Pt still struggling to respond to questions and took a long time to identify physical as a wellness area that was a positive in his life.       Therapeutic Modalities: Cognitive Behavioral Therapy Psychoeducation    Joanne Chars, LCSW

## 2022-07-28 NOTE — Progress Notes (Signed)
Patient alert and oriented x4.  Patient ambulatory from room to dayroom with front wheel walker.  Patient with appropriate affect.  Denies SI/HI and AVH.  Patient endorses some anxiety and depression. Denies pain.  Patient is initiating  conversations and speaking in complete sentences.   Compliant with medications.  No adverse reactions noted.  15 min checks in place.  Patient remains safe on the unit. Patient in dayroom working on puzzle with peers.   Patient shaved with monitoring by RN.

## 2022-07-28 NOTE — Progress Notes (Signed)
Patient is alert and oriented times 3. Mood and affect appropriate. Patient rates pain as 0/10. He denies SI, HI, and AVH. Patient also denies any feelings of anxiety and depression at this time. Patient states he slept well last night. Evening medicines administered whole by mouth without difficulty. Patient ate snack in day room; appetite was fair. Patient remains on unit with Q15 minute checks in place.

## 2022-07-29 ENCOUNTER — Inpatient Hospital Stay: Payer: Medicare Other | Admitting: Certified Registered"

## 2022-07-29 ENCOUNTER — Encounter: Payer: Self-pay | Admitting: Psychiatry

## 2022-07-29 ENCOUNTER — Ambulatory Visit: Payer: Medicare Other

## 2022-07-29 ENCOUNTER — Ambulatory Visit: Payer: Medicare Other | Admitting: Psychiatry

## 2022-07-29 DIAGNOSIS — F332 Major depressive disorder, recurrent severe without psychotic features: Secondary | ICD-10-CM | POA: Diagnosis not present

## 2022-07-29 LAB — GLUCOSE, CAPILLARY
Glucose-Capillary: 90 mg/dL (ref 70–99)
Glucose-Capillary: 218 mg/dL — ABNORMAL HIGH (ref 70–99)
Glucose-Capillary: 77 mg/dL (ref 70–99)

## 2022-07-29 MED ORDER — GLYCOPYRROLATE 0.2 MG/ML IJ SOLN
0.1000 mg | Freq: Once | INTRAMUSCULAR | Status: DC
  Filled 2022-07-29: qty 0.5

## 2022-07-29 MED ORDER — ONDANSETRON HCL 4 MG/2ML IJ SOLN: 4.0000 mg | Freq: Once | INTRAMUSCULAR | Status: DC | PRN

## 2022-07-29 MED ORDER — MIDAZOLAM HCL 2 MG/2ML IJ SOLN
INTRAMUSCULAR | Status: AC
  Filled 2022-07-29: qty 2

## 2022-07-29 MED ORDER — SUCCINYLCHOLINE CHLORIDE 200 MG/10ML IV SOSY
PREFILLED_SYRINGE | INTRAVENOUS | Status: DC | PRN
  Administered 2022-07-29: 100 mg via INTRAVENOUS

## 2022-07-29 MED ORDER — SODIUM CHLORIDE 0.9 % IV SOLN: 500.0000 mL | Freq: Once | INTRAVENOUS | Status: AC

## 2022-07-29 MED ORDER — KETOROLAC TROMETHAMINE 30 MG/ML IJ SOLN: 30.0000 mg | Freq: Once | INTRAMUSCULAR | Status: DC

## 2022-07-29 MED ORDER — KETAMINE HCL 50 MG/5ML IJ SOSY
PREFILLED_SYRINGE | INTRAMUSCULAR | Status: AC
  Filled 2022-07-29: qty 5

## 2022-07-29 MED ORDER — FENTANYL CITRATE PF 50 MCG/ML IJ SOSY: 25.0000 ug | PREFILLED_SYRINGE | INTRAMUSCULAR | Status: DC | PRN

## 2022-07-29 MED ORDER — MIDAZOLAM HCL 2 MG/2ML IJ SOLN
2.0000 mg | Freq: Once | INTRAMUSCULAR | Status: AC
  Administered 2022-07-29: 2 mg via INTRAVENOUS
  Filled 2022-07-29: qty 2

## 2022-07-29 MED ORDER — KETAMINE HCL 10 MG/ML IJ SOLN
INTRAMUSCULAR | Status: DC | PRN
  Administered 2022-07-29: 100 mg via INTRAVENOUS

## 2022-07-29 NOTE — H&P (Signed)
John [name] is an 81 y.o. male.   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 06/2015 CT Head: abnl thickening of mid-dist R cervical ICA w/o narrowing - ? vasculitis and thrombosed/healing dissection; b. 01/2021 Carotid U/S: 1-39% bilat ICA stenoses.   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 Age 24 - struck in head w/ baseball --> s/p craniotomy.   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 Approx 2015 - eval in [place] - conservatively managed.    Past Surgical History:  Procedure Laterality Date   ANKLE ARTHROSCOPY Left    fracture   CHOLECYSTECTOMY  2007   [place]   left frontal craniotomy for epidural hematoma   HIP ARTHROPLASTY Right 04/07/2022   Procedure: ARTHROPLASTY BIPOLAR HIP (HEMIARTHROPLASTY);  Surgeon: Earnestine Leys, MD;  Location: [facility] ORS;  Service: Orthopedics;  Laterality: Right;   INSERTION OF MESH  01/02/2022   Procedure: INSERTION OF MESH;  Surgeon: Herbert Pun, MD;  Location: [facility] ORS;  Service: General;;   LIGAMENT REPAIR Right    thumb   RETINAL DETACHMENT SURGERY     TREATMENT FISTULA ANAL  2006    Family History  Problem Relation Age of Onset   Osteoporosis Mother    Depression Mother    Prostate cancer Father    Hypertension Father    Diabetes Brother    Kidney disease Neg Hx    Social History:  reports that he has never smoked. He has never used smokeless tobacco. He reports that he does not  currently use alcohol. He reports that he does not use drugs.  Allergies: No Known Allergies  Medications Prior to Admission  Medication Sig Dispense Refill   aspirin EC 81 MG tablet Take 81 mg by mouth daily.     buPROPion (WELLBUTRIN XL) 300 MG 24 hr tablet Take 1 tablet by mouth daily.     docusate sodium (COLACE) 100 MG capsule Take 1 capsule (100 mg total) by mouth 2 (two) times daily. 10 capsule 0   methocarbamol (ROBAXIN) 500 MG tablet Take 1 tablet (500 mg total) by mouth every 6 (six) hours as needed for muscle spasms. 30 tablet 0   metoprolol tartrate (LOPRESSOR) 25 MG tablet Take 1 tablet (25 mg total) by mouth 2 (two) times daily. 60 tablet 3   polyethylene glycol (MIRALAX / GLYCOLAX) 17 g packet Take 17 g by mouth daily as needed. 14 each 0   senna (SENOKOT) 8.6 MG TABS tablet Take 1 tablet (8.6 mg total) by mouth 2 (two) times daily. 120 tablet 0   sertraline (ZOLOFT) 50 MG tablet Take 1 tablet (50 mg total) by mouth at bedtime. 30 tablet 0   sertraline (ZOLOFT) 50 MG tablet Take 1 tablet (50 mg total) by mouth daily.  30 tablet 1   tamsulosin (FLOMAX) 0.4 MG CAPS capsule Take 1 capsule (0.4 mg total) by mouth daily after supper. 30 capsule 3   traZODone (DESYREL) 50 MG tablet Take 1 tablet (50 mg total) by mouth at bedtime as needed for sleep. 30 tablet 3    Results for orders placed or performed during the hospital encounter of 07/04/22 (from the past 48 hour(s))  Glucose, capillary     Status: Abnormal   Collection Time: 07/27/22  4:24 PM  Result Value Ref Range   Glucose-Capillary 105 (H) 70 - 99 mg/dL    Comment: Glucose reference range applies only to samples taken after fasting for at least 8 hours.  Glucose, capillary     Status: None   Collection Time: 07/27/22  8:06 PM  Result Value Ref Range   Glucose-Capillary 89 70 - 99 mg/dL    Comment: Glucose reference range applies only to samples taken after fasting for at least 8 hours.  Glucose, capillary     Status:  None   Collection Time: 07/28/22  7:42 AM  Result Value Ref Range   Glucose-Capillary 91 70 - 99 mg/dL    Comment: Glucose reference range applies only to samples taken after fasting for at least 8 hours.  Glucose, capillary     Status: Abnormal   Collection Time: 07/28/22 11:27 AM  Result Value Ref Range   Glucose-Capillary 117 (H) 70 - 99 mg/dL    Comment: Glucose reference range applies only to samples taken after fasting for at least 8 hours.  Glucose, capillary     Status: Abnormal   Collection Time: 07/28/22  4:14 PM  Result Value Ref Range   Glucose-Capillary 134 (H) 70 - 99 mg/dL    Comment: Glucose reference range applies only to samples taken after fasting for at least 8 hours.  Glucose, capillary     Status: Abnormal   Collection Time: 07/28/22  8:06 PM  Result Value Ref Range   Glucose-Capillary 126 (H) 70 - 99 mg/dL    Comment: Glucose reference range applies only to samples taken after fasting for at least 8 hours.  Glucose, capillary     Status: None   Collection Time: 07/29/22  7:39 AM  Result Value Ref Range   Glucose-Capillary 90 70 - 99 mg/dL    Comment: Glucose reference range applies only to samples taken after fasting for at least 8 hours.   No results found.  Review of Systems  Constitutional: Negative.   HENT: Negative.    Eyes: Negative.   Respiratory: Negative.    Cardiovascular: Negative.   Gastrointestinal: Negative.   Musculoskeletal: Negative.   Skin: Negative.   Neurological: Negative.   Psychiatric/Behavioral: Negative.      Blood pressure 101/75, pulse 65, temperature 98.5 F (36.9 C), temperature source Oral, resp. rate 18, height '6\' 2"'$  (1.88 m), weight 70.8 kg, SpO2 98 %. Physical Exam Vitals and nursing note reviewed.  Constitutional:      Appearance: He is well-developed.  HENT:     Head: Normocephalic and atraumatic.  Eyes:     Conjunctiva/sclera: Conjunctivae normal.     Pupils: Pupils are equal, round, and reactive to light.   Cardiovascular:     Heart sounds: Normal heart sounds.  Pulmonary:     Effort: Pulmonary effort is normal.  Abdominal:     Palpations: Abdomen is soft.  Musculoskeletal:        General: Normal range of motion.     Cervical  back: Normal range of motion.  Skin:    General: Skin is warm and dry.  Neurological:     General: No focal deficit present.     Mental Status: He is alert.  Psychiatric:        Mood and Affect: Mood normal.      Assessment/Plan End index today and schedule maintenence  [name], MD 07/29/2022, 12:18 PM

## 2022-07-29 NOTE — [facility] Group Notes (Signed)
Pt attended recreation outside and [place]. Pt sat quietly but nodded head to music.

## 2022-07-29 NOTE — Progress Notes (Signed)
[facility] MD Progress Note  07/29/2022 10:58 AM John [name]  MRN:  [id number] Subjective: John [name] is seen on rounds.  Seems to be in a pretty good mood.  He is smiling.  He has ECT today.  No side effects from his medications.  I discussed with him going home on Wednesday.  He is doing better and I agree he probably needs maintenance ECT.  Principal Problem: Severe recurrent major depression without psychotic features ([place]) Diagnosis: Principal Problem:   Severe recurrent major depression without psychotic features ([place]) Active Problems:   Major depressive disorder, recurrent severe without psychotic features ([place])   Pressure injury of skin  Total Time spent with patient: 15 minutes  Past Psychiatric History:  Patient has a history of past depression repeated episodes throughout his life.  1 prior suicide attempt.  Was admitted to the hospital in mid May here with depression.  Was not showing significant improvement over time and was referred for ECT.  Patient received a few ECT treatments which generally appeared to be tolerated well.  This past weekend however he decompensated for unclear reasons at the very end of Sunday and prior to treatment on Monday was seen to be delirious with an arrhythmia.  Arrhythmia has stabilized.  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 06/2015 CT Head: abnl thickening of mid-dist R cervical ICA w/o narrowing - ? vasculitis and thrombosed/healing dissection; b. 01/2021 Carotid U/S: 1-39% bilat ICA stenoses.   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 Age 81 - struck in head w/ baseball --> s/p craniotomy.   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 Approx 2015 - eval in [place] - conservatively managed.    Past Surgical History:  Procedure Laterality Date   ANKLE ARTHROSCOPY Left    fracture   CHOLECYSTECTOMY  2007   [place]   left frontal craniotomy for epidural hematoma   HIP ARTHROPLASTY Right 04/07/2022   Procedure: ARTHROPLASTY BIPOLAR HIP (HEMIARTHROPLASTY);  Surgeon: Earnestine Leys, MD;  Location: [facility] ORS;  Service: Orthopedics;  Laterality: Right;   INSERTION OF MESH  01/02/2022   Procedure: INSERTION OF MESH;  Surgeon: Herbert Pun, MD;  Location: [facility] ORS;  Service: General;;   LIGAMENT REPAIR Right    thumb   RETINAL DETACHMENT SURGERY     TREATMENT FISTULA ANAL  2006   Family History:  Family History  Problem Relation Age of Onset   Osteoporosis Mother    Depression Mother    Prostate cancer Father    Hypertension Father    Diabetes Brother    Kidney disease Neg Hx    Family Psychiatric  History: Unremarkable Social History:  Social History   Substance and Sexual Activity  Alcohol Use Not Currently     Social History   Substance  and Sexual Activity  Drug Use No    Social History   Socioeconomic History   Marital status: Married    Spouse name: [name]   Number of children: 2   Years of education: Not on file   Highest education level: Master's degree (e.g., MA, MS, MEng, MEd, MSW, MBA)  Occupational History   Not on file  Tobacco Use   Smoking status: Never   Smokeless tobacco: Never  Vaping Use   Vaping Use: Never used  Substance and Sexual Activity   Alcohol use: Not Currently   Drug use: No   Sexual activity: Yes    Birth control/protection: None  Other Topics Concern   Not on file  Social History Narrative   Live at home with wife.    Social Determinants of Health   Financial  Resource Strain: Not on file  Food Insecurity: Food Insecurity Present (07/09/2022)   Hunger Vital Sign    Worried About Running Out of Food in the Last Year: Sometimes true    Ran Out of Food in the Last Year: Sometimes true  Transportation Needs: No Transportation Needs (07/09/2022)   PRAPARE - Hydrologist (Medical): No    Lack of Transportation (Non-Medical): No  Physical Activity: Not on file  Stress: Not on file  Social Connections: Not on file   Additional Social History:                         Sleep: Good  Appetite:  Good  Current Medications: Current Facility-Administered Medications  Medication Dose Route Frequency Provider Last Rate Last Admin   0.9 %  sodium chloride infusion  500 mL Intravenous Once Clapacs, John T, MD       0.9 %  sodium chloride infusion  500 mL Intravenous Once Clapacs, John T, MD       0.9 %  sodium chloride infusion  500 mL Intravenous Once Clapacs, John T, MD       0.9 %  sodium chloride infusion  500 mL Intravenous Once Clapacs, John T, MD       0.9 %  sodium chloride infusion  500 mL Intravenous Once Clapacs, John T, MD       acetaminophen (TYLENOL) tablet 650 mg  650 mg Oral Q6H PRN Patrecia Pour, NP   650 mg at 07/20/22 0902   alum & mag hydroxide-simeth (MAALOX/MYLANTA) 200-200-20 MG/5ML suspension 30 mL  30 mL Oral Q4H PRN Patrecia Pour, NP       ascorbic acid (VITAMIN C) tablet 500 mg  500 mg Oral BID Parks Ranger, DO   500 mg at 07/29/22 3419   aspirin EC tablet 81 mg  81 mg Oral Daily Patrecia Pour, NP   81 mg at 07/29/22 6222   buPROPion (WELLBUTRIN XL) 24 hr tablet 300 mg  300 mg Oral Daily Patrecia Pour, NP   300 mg at 07/29/22 9798   docusate sodium (COLACE) capsule 100 mg  100 mg Oral BID Patrecia Pour, NP   100 mg at 07/29/22 9211   glycopyrrolate (ROBINUL) injection 0.1 mg  0.1 mg Intravenous Once Clapacs, [name], MD       glycopyrrolate (ROBINUL) injection 0.1 mg  0.1 mg  Intravenous Once Clapacs, [name], MD       glycopyrrolate (ROBINUL) injection 0.1 mg  0.1 mg Intravenous Once Clapacs, [name], MD  insulin aspart (novoLOG) injection 0-6 Units  0-6 Units Subcutaneous TID WC Parks Ranger, DO   1 Units at 07/26/22 1650   ketorolac (TORADOL) 30 MG/ML injection 30 mg  30 mg Intravenous Once Clapacs, [name], MD       liver oil-zinc oxide (DESITIN) 40 % ointment   Topical BID Parks Ranger, DO   Given at 07/29/22 3086   magnesium hydroxide (MILK OF MAGNESIA) suspension 30 mL  30 mL Oral Daily PRN Patrecia Pour, NP       methocarbamol (ROBAXIN) tablet 500 mg  500 mg Oral Q6H PRN Patrecia Pour, NP       midazolam (VERSED) injection 2 mg  2 mg Intravenous Once Clapacs, John T, MD       midazolam (VERSED) injection 2 mg  2 mg Intravenous Once Clapacs, [name], MD       midazolam (VERSED) injection 2 mg  2 mg Intravenous Once Clapacs, [name], MD       modafinil (PROVIGIL) tablet 100 mg  100 mg Oral QPC breakfast Parks Ranger, DO   100 mg at 07/29/22 5784   multivitamin with minerals tablet 1 tablet  1 tablet Oral Daily Parks Ranger, DO   1 tablet at 07/29/22 0828   OLANZapine (ZYPREXA) tablet 10 mg  10 mg Oral QHS Parks Ranger, DO   10 mg at 07/28/22 2112   polyethylene glycol (MIRALAX / GLYCOLAX) packet 17 g  17 g Oral Daily PRN Patrecia Pour, NP       protein supplement (ENSURE MAX) liquid  11 oz Oral BID Parks Ranger, DO   237 mL at 07/28/22 2111   senna (SENOKOT) tablet 8.6 mg  1 tablet Oral BID Patrecia Pour, NP   8.6 mg at 07/29/22 6962   sertraline (ZOLOFT) tablet 50 mg  50 mg Oral QHS Patrecia Pour, NP   50 mg at 07/28/22 2112   traZODone (DESYREL) tablet 50 mg  50 mg Oral QHS Parks Ranger, DO   50 mg at 07/28/22 2112    Lab Results:  Results for orders placed or performed during the hospital encounter of 07/04/22 (from the past 48 hour(s))  Glucose, capillary     Status:  Abnormal   Collection Time: 07/27/22 11:39 AM  Result Value Ref Range   Glucose-Capillary 144 (H) 70 - 99 mg/dL    Comment: Glucose reference range applies only to samples taken after fasting for at least 8 hours.  Glucose, capillary     Status: Abnormal   Collection Time: 07/27/22  4:24 PM  Result Value Ref Range   Glucose-Capillary 105 (H) 70 - 99 mg/dL    Comment: Glucose reference range applies only to samples taken after fasting for at least 8 hours.  Glucose, capillary     Status: None   Collection Time: 07/27/22  8:06 PM  Result Value Ref Range   Glucose-Capillary 89 70 - 99 mg/dL    Comment: Glucose reference range applies only to samples taken after fasting for at least 8 hours.  Glucose, capillary     Status: None   Collection Time: 07/28/22  7:42 AM  Result Value Ref Range   Glucose-Capillary 91 70 - 99 mg/dL    Comment: Glucose reference range applies only to samples taken after fasting for at least 8 hours.  Glucose, capillary     Status: Abnormal   Collection Time: 07/28/22 11:27 AM  Result Value  Ref Range   Glucose-Capillary 117 (H) 70 - 99 mg/dL    Comment: Glucose reference range applies only to samples taken after fasting for at least 8 hours.  Glucose, capillary     Status: Abnormal   Collection Time: 07/28/22  4:14 PM  Result Value Ref Range   Glucose-Capillary 134 (H) 70 - 99 mg/dL    Comment: Glucose reference range applies only to samples taken after fasting for at least 8 hours.  Glucose, capillary     Status: Abnormal   Collection Time: 07/28/22  8:06 PM  Result Value Ref Range   Glucose-Capillary 126 (H) 70 - 99 mg/dL    Comment: Glucose reference range applies only to samples taken after fasting for at least 8 hours.  Glucose, capillary     Status: None   Collection Time: 07/29/22  7:39 AM  Result Value Ref Range   Glucose-Capillary 90 70 - 99 mg/dL    Comment: Glucose reference range applies only to samples taken after fasting for at least 8 hours.     Blood Alcohol level:  Lab Results  Component Value Date   ETH <10 07/02/2022   ETH <10 49/67/5916    Metabolic Disorder Labs: Lab Results  Component Value Date   HGBA1C 5.6 03/19/2022   MPG 114.02 03/19/2022   No results found for: "PROLACTIN" Lab Results  Component Value Date   CHOL 131 03/19/2022   TRIG 92 03/19/2022   HDL 33 (L) 03/19/2022   CHOLHDL 4.0 03/19/2022   VLDL 18 03/19/2022   LDLCALC 80 03/19/2022    Physical Findings: AIMS:  , ,  ,  ,    CIWA:    COWS:     Musculoskeletal: Strength & Muscle Tone: within normal limits Gait & Station: normal Patient leans: N/A  Psychiatric Specialty Exam:  Presentation  General Appearance:  Appropriate for Environment; Casual  Eye Contact: Fleeting  Speech: Garbled  Speech Volume: Decreased  Handedness: Right   Mood and Affect  Mood: Anxious; Depressed  Affect: Flat   Thought Process  Thought Processes: Linear; Coherent; Goal Directed  Descriptions of Associations:Loose  Orientation:Partial  Thought Content:Scattered  History of Schizophrenia/Schizoaffective disorder:No  Duration of Psychotic Symptoms:No data recorded Hallucinations:No data recorded Ideas of Reference:None  Suicidal Thoughts:No data recorded Homicidal Thoughts:No data recorded  Sensorium  Memory: Recent Poor; Remote Poor  Judgment: Poor  Insight: Fair   Community education officer  Concentration: Fair  Attention Span: Poor  Recall: Poor  Fund of Knowledge: Poor  Language: Poor   Psychomotor Activity  Psychomotor Activity:No data recorded  Assets  Assets: Financial Resources/Insurance   Sleep  Sleep:No data recorded   Physical Exam: Physical Exam Vitals and nursing note reviewed.  Constitutional:      Appearance: Normal appearance. He is normal weight.  Neurological:     General: No focal deficit present.     Mental Status: He is alert and oriented to person, place, and time.   Psychiatric:        Attention and Perception: Attention and perception normal.        Mood and Affect: Mood is depressed. Affect is flat.        Speech: Speech normal.        Behavior: Behavior normal. Behavior is cooperative.        Thought Content: Thought content normal.        Cognition and Memory: Cognition is impaired. Memory is impaired.        Judgment: Judgment normal.  Review of Systems  Constitutional: Negative.   HENT: Negative.    Eyes: Negative.   Respiratory: Negative.    Cardiovascular: Negative.   Gastrointestinal: Negative.   Genitourinary: Negative.   Musculoskeletal: Negative.   Skin: Negative.   Neurological: Negative.   Endo/Heme/Allergies: Negative.   Psychiatric/Behavioral:  Positive for depression.    Blood pressure 95/71, pulse 74, temperature 98.4 F (36.9 C), temperature source Oral, resp. rate 19, height '6\' 2"'$  (1.88 m), weight 70.8 kg, SpO2 100 %. Body mass index is 20.03 kg/m.   Treatment Plan Summary: Daily contact with patient to assess and evaluate symptoms and progress in treatment, Medication management, and Plan continue current medications.  Parks Ranger, DO 07/29/2022, 10:58 AM

## 2022-07-29 NOTE — Procedures (Signed)
ECT SERVICES Physician's Interval Evaluation & Treatment Note  Patient Identification: John [name] MRN:  [id number] Date of Evaluation:  07/29/2022 TX #: [id number]  MADRS:   MMSE:   P.E. Findings:  No change to physical exam  Psychiatric Interval Note:  Upbeat lucid  Subjective:  Patient is a 81 y.o. male seen for evaluation for Electroconvulsive Therapy. No complaint  Treatment Summary:   '[x]'$   Right Unilateral             '[]'$  Bilateral   % Energy : 0.3 ms 100%   Impedance: 1980 ohms  Seizure Energy Index: 3549 V squared  Postictal Suppression Index: 63%  Seizure Concordance Index: 92%  Medications  Pre Shock: Robinul 0.1 mg labetalol 10 mg ketamine 100 mg succinylcholine 100 mg  Post Shock: Versed 2 mg  Seizure Duration: EMG 21 seconds EEG 35 seconds   Comments: Follow-up 2 weeks  Lungs:  '[x]'$   Clear to auscultation               '[]'$  Other:   Heart:    '[x]'$   Regular rhythm             '[]'$  irregular rhythm    '[x]'$   Previous H&P reviewed, patient examined and there are NO CHANGES                 '[]'$   Previous H&P reviewed, patient examined and there are changes noted.   [name], MD 10/2/20236:36 PM

## 2022-07-29 NOTE — Anesthesia Postprocedure Evaluation (Signed)
Anesthesia Post Note  Patient: John [name]  Procedure(s) Performed: ECT TX  Patient location during evaluation: PACU Anesthesia Type: General Level of consciousness: awake and sedated Pain management: satisfactory to patient Vital Signs Assessment: post-procedure vital signs reviewed and stable Respiratory status: spontaneous breathing and respiratory function stable Cardiovascular status: stable Anesthetic complications: no   No notable events documented.   Last Vitals:  Vitals:   07/29/22 1300 07/29/22 1308  BP: (!) 181/95 (!) 160/96  Pulse: 82 82  Resp: 18 17  Temp:    SpO2: 100% 96%    Last Pain:  Vitals:   07/29/22 1308  TempSrc:   PainSc: 0-No pain                 VAN STAVEREN,Matas Burrows

## 2022-07-29 NOTE — Group Note (Signed)
[facility] LCSW Group Therapy Note    Group Date: 07/29/2022 Start Time: 1330 End Time: 1415  Type of Therapy and Topic:  Group Therapy:  Overcoming Obstacles  Participation Level:  [facility] PARTICIPATION LEVEL: None  Mood:  Description of Group:   In this group patients will be encouraged to explore what they see as obstacles to their own wellness and recovery. They will be guided to discuss their thoughts, feelings, and behaviors related to these obstacles. The group will process together ways to cope with barriers, with attention given to specific choices patients can make. Each patient will be challenged to identify changes they are motivated to make in order to overcome their obstacles. This group will be process-oriented, with patients participating in exploration of their own experiences as well as giving and receiving support and challenge from other group members.  Therapeutic Goals: 1. Patient will identify personal and current obstacles as they relate to admission. 2. Patient will identify barriers that currently interfere with their wellness or overcoming obstacles.  3. Patient will identify feelings, thought process and behaviors related to these barriers. 4. Patient will identify two changes they are willing to make to overcome these obstacles:    Summary of Patient Progress Patient had just returned from ECT and was eating his meal.   Therapeutic Modalities:   Cognitive Behavioral Therapy Solution Focused Therapy Motivational Interviewing Relapse Prevention Therapy   Rozann Lesches, LCSW

## 2022-07-29 NOTE — Progress Notes (Signed)
Patient incontinent, changed patient

## 2022-07-29 NOTE — Plan of Care (Signed)

## 2022-07-29 NOTE — Progress Notes (Signed)
Physical Therapy Treatment Patient Details Name: John [name] MRN: [id number] DOB: 1941-09-20 Today's Date: 07/29/2022   History of Present Illness 81 y/o M who was voluntarily admitted to geriatric psychiatry for worsening depression. Pt was d/c from the unit ~2 months ago & was noncompliatn with medications. PMH: CA, depression, DM2, epidural hematoma s/p craniotomy (age 25), Horner syndrome, HTN, migraine, obesity, sleep apnea.    PT Comments    Pt with ECT earlier today, staff reported pt to be a bit more "wobbly" after procedure. Pt did appear a bit more unsteady compared to previous session, most safe ambulating with RW. Was able to ambulate with PT without AD, but noted to have some staggering, drifting L and R without it. Pt also instructed in standing balance exercises for HEP, CGA throughout. Pt did have several LOB (standing in corner of room to assist with safety), but able to recover with assistance and without. The patient would benefit from further skilled PT intervention to continue to progress towards goals. Recommendation remains appropriate.       Recommendations for follow up therapy are one component of a multi-disciplinary discharge planning process, led by the attending physician.  Recommendations may be updated based on patient status, additional functional criteria and insurance authorization.  Follow Up Recommendations  Outpatient PT     Assistance Recommended at Discharge Intermittent Supervision/Assistance  Patient can return home with the following A little help with walking and/or transfers;A little help with bathing/dressing/bathroom;Assistance with cooking/housework;Direct supervision/assist for financial management;Assist for transportation;Help with stairs or ramp for entrance;Direct supervision/assist for medications management   Equipment Recommendations  Rolling walker (2 wheels)    Recommendations for Other Services       Precautions / Restrictions  Precautions Precautions: Fall Restrictions Weight Bearing Restrictions: No     Mobility  Bed Mobility                    Transfers Overall transfer level: Needs assistance Equipment used: Rolling walker (2 wheels) Transfers: Sit to/from Stand Sit to Stand: Supervision           General transfer comment: posterior lean    Ambulation/Gait Ambulation/Gait assistance: Supervision Gait Distance (Feet):  (>458f) Assistive device: Rolling walker (2 wheels), None         General Gait Details: steady with RW use, without noted for drifting L/R, some staggering   Stairs             Wheelchair Mobility    Modified Rankin (Stroke Patients Only)       Balance Overall balance assessment: Needs assistance Sitting-balance support: Feet supported Sitting balance-Leahy Scale: Good       Standing balance-Leahy Scale: Fair Standing balance comment: improved balance with RW support                            Cognition Arousal/Alertness: Awake/alert Behavior During Therapy: Flat affect Overall Cognitive Status: Within Functional Limits for tasks assessed                                 General Comments: Follows simple commands throughout session.        Exercises Other Exercises Other Exercises: standing in corner: FT vertical turns x5, horizontal head turns x10, one step forward x20 sec ea BLE. several instances of LOB    General Comments        Pertinent  Vitals/Pain Pain Assessment Pain Assessment: No/denies pain    Home Living                          Prior Function            PT Goals (current goals can now be found in the care plan section) Progress towards PT goals: Progressing toward goals    Frequency    Min 2X/week      PT Plan Current plan remains appropriate    Co-evaluation              AM-PAC PT "6 Clicks" Mobility   Outcome Measure  Help needed turning from your back to  your side while in a flat bed without using bedrails?: None Help needed moving from lying on your back to sitting on the side of a flat bed without using bedrails?: None Help needed moving to and from a bed to a chair (including a wheelchair)?: None Help needed standing up from a chair using your arms (e.g., wheelchair or bedside chair)?: None Help needed to walk in hospital room?: A Little Help needed climbing 3-5 steps with a railing? : A Little 6 Click Score: 22    End of Session Equipment Utilized During Treatment: Gait belt Activity Tolerance: Patient tolerated treatment well Patient left: in chair;Other (comment) (in day room) Nurse Communication: Mobility status PT Visit Diagnosis: Unsteadiness on feet (R26.81);Muscle weakness (generalized) (M62.81)     Time: 4695-0722 PT Time Calculation (min) (ACUTE ONLY): 20 min  Charges:  $Neuromuscular Re-education: 8-22 mins                     Lieutenant Diego PT, DPT 3:40 PM,07/29/22

## 2022-07-29 NOTE — Transfer of Care (Signed)
Immediate Anesthesia Transfer of Care Note  Patient: John [name]  Procedure(s) Performed: ECT TX  Patient Location: PACU  Anesthesia Type:General  Level of Consciousness: drowsy  Airway & Oxygen Therapy: Patient Spontanous Breathing and Patient connected to nasal cannula oxygen  Post-op Assessment: Report given to RN  Post vital signs: stable  Last Vitals:  Vitals Value Taken Time  BP    Temp    Pulse    Resp    SpO2      Last Pain:  Vitals:   07/29/22 1216  TempSrc:   PainSc: 0-No pain      Patients Stated Pain Goal: 0 (64/40/34 7425)  Complications: No notable events documented.

## 2022-07-29 NOTE — H&P (Signed)
John [name] is an 81 y.o. male.   Chief Complaint: No new complaint.  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 06/2015 CT Head: abnl thickening of mid-dist R cervical ICA w/o narrowing - ? vasculitis and thrombosed/healing dissection; b. 01/2021 Carotid U/S: 1-39% bilat ICA stenoses.   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 Age 18 - struck in head w/ baseball --> s/p craniotomy.   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 Approx 2015 - eval in [place] - conservatively managed.    Past Surgical History:  Procedure Laterality Date   ANKLE ARTHROSCOPY Left    fracture   CHOLECYSTECTOMY  2007   [place]   left frontal craniotomy for epidural hematoma   HIP ARTHROPLASTY Right 04/07/2022   Procedure: ARTHROPLASTY BIPOLAR HIP (HEMIARTHROPLASTY);  Surgeon: Earnestine Leys, MD;  Location: [facility] ORS;  Service: Orthopedics;  Laterality: Right;   INSERTION OF MESH  01/02/2022   Procedure: INSERTION OF MESH;  Surgeon: Herbert Pun, MD;  Location: [facility] ORS;  Service: General;;   LIGAMENT REPAIR Right    thumb   RETINAL DETACHMENT SURGERY     TREATMENT FISTULA ANAL  2006    Family History  Problem Relation Age of Onset   Osteoporosis Mother    Depression Mother    Prostate cancer Father    Hypertension Father    Diabetes Brother    Kidney disease Neg Hx    Social History:  reports that he has never smoked. He has never used smokeless tobacco. He reports  that he does not currently use alcohol. He reports that he does not use drugs.  Allergies: No Known Allergies  Medications Prior to Admission  Medication Sig Dispense Refill   aspirin EC 81 MG tablet Take 81 mg by mouth daily.     buPROPion (WELLBUTRIN XL) 300 MG 24 hr tablet Take 1 tablet by mouth daily.     docusate sodium (COLACE) 100 MG capsule Take 1 capsule (100 mg total) by mouth 2 (two) times daily. 10 capsule 0   methocarbamol (ROBAXIN) 500 MG tablet Take 1 tablet (500 mg total) by mouth every 6 (six) hours as needed for muscle spasms. 30 tablet 0   metoprolol tartrate (LOPRESSOR) 25 MG tablet Take 1 tablet (25 mg total) by mouth 2 (two) times daily. 60 tablet 3   polyethylene glycol (MIRALAX / GLYCOLAX) 17 g packet Take 17 g by mouth daily as needed. 14 each 0   senna (SENOKOT) 8.6 MG TABS tablet Take 1 tablet (8.6 mg total) by mouth 2 (two) times daily. 120 tablet 0   sertraline (ZOLOFT) 50 MG tablet Take 1 tablet (50 mg total) by mouth at bedtime. 30 tablet 0   sertraline (ZOLOFT) 50 MG tablet Take 1 tablet (50  mg total) by mouth daily. 30 tablet 1   tamsulosin (FLOMAX) 0.4 MG CAPS capsule Take 1 capsule (0.4 mg total) by mouth daily after supper. 30 capsule 3   traZODone (DESYREL) 50 MG tablet Take 1 tablet (50 mg total) by mouth at bedtime as needed for sleep. 30 tablet 3    Results for orders placed or performed during the hospital encounter of 07/04/22 (from the past 48 hour(s))  Glucose, capillary     Status: None   Collection Time: 07/27/22  8:06 PM  Result Value Ref Range   Glucose-Capillary 89 70 - 99 mg/dL    Comment: Glucose reference range applies only to samples taken after fasting for at least 8 hours.  Glucose, capillary     Status: None   Collection Time: 07/28/22  7:42 AM  Result Value Ref Range   Glucose-Capillary 91 70 - 99 mg/dL    Comment: Glucose reference range applies only to samples taken after fasting for at least 8 hours.  Glucose, capillary      Status: Abnormal   Collection Time: 07/28/22 11:27 AM  Result Value Ref Range   Glucose-Capillary 117 (H) 70 - 99 mg/dL    Comment: Glucose reference range applies only to samples taken after fasting for at least 8 hours.  Glucose, capillary     Status: Abnormal   Collection Time: 07/28/22  4:14 PM  Result Value Ref Range   Glucose-Capillary 134 (H) 70 - 99 mg/dL    Comment: Glucose reference range applies only to samples taken after fasting for at least 8 hours.  Glucose, capillary     Status: Abnormal   Collection Time: 07/28/22  8:06 PM  Result Value Ref Range   Glucose-Capillary 126 (H) 70 - 99 mg/dL    Comment: Glucose reference range applies only to samples taken after fasting for at least 8 hours.  Glucose, capillary     Status: None   Collection Time: 07/29/22  7:39 AM  Result Value Ref Range   Glucose-Capillary 90 70 - 99 mg/dL    Comment: Glucose reference range applies only to samples taken after fasting for at least 8 hours.  Glucose, capillary     Status: Abnormal   Collection Time: 07/29/22  4:04 PM  Result Value Ref Range   Glucose-Capillary 218 (H) 70 - 99 mg/dL    Comment: Glucose reference range applies only to samples taken after fasting for at least 8 hours.   No results found.  Review of Systems  Constitutional: Negative.   HENT: Negative.    Eyes: Negative.   Respiratory: Negative.    Cardiovascular: Negative.   Gastrointestinal: Negative.   Musculoskeletal: Negative.   Skin: Negative.   Neurological: Negative.   Psychiatric/Behavioral: Negative.      Blood pressure 117/78, pulse 82, temperature 97.8 F (36.6 C), temperature source Oral, resp. rate 13, height '6\' 2"'$  (1.88 m), weight 70.8 kg, SpO2 96 %. Physical Exam Vitals reviewed.  Constitutional:      Appearance: He is well-developed.  HENT:     Head: Normocephalic and atraumatic.  Eyes:     Conjunctiva/sclera: Conjunctivae normal.     Pupils: Pupils are equal, round, and reactive to light.   Cardiovascular:     Heart sounds: Normal heart sounds.  Pulmonary:     Effort: Pulmonary effort is normal.  Abdominal:     Palpations: Abdomen is soft.  Musculoskeletal:        General: Normal range of motion.  Cervical back: Normal range of motion.  Skin:    General: Skin is warm and dry.  Neurological:     General: No focal deficit present.     Mental Status: He is alert.  Psychiatric:        Mood and Affect: Mood normal.        Thought Content: Thought content normal.      Assessment/Plan We will do ECT today at which point we are done with this course of index treatment.  Recommend follow-up maintenance treatment  [name], MD 07/29/2022, 6:24 PM

## 2022-07-29 NOTE — Anesthesia Preprocedure Evaluation (Signed)
Anesthesia Evaluation  Patient identified by MRN, date of birth, ID band Patient awake and Patient confused    Reviewed: Allergy & Precautions, NPO status , Patient's Chart, lab work & pertinent test results  History of Anesthesia Complications (+) PONV and history of anesthetic complications  Airway Mallampati: II  TM Distance: >3 FB Neck ROM: Full    Dental  (+) Caps,    Pulmonary neg pulmonary ROS, sleep apnea ,    Pulmonary exam normal breath sounds clear to auscultation       Cardiovascular hypertension, Pt. on medications negative cardio ROS Normal cardiovascular exam Rhythm:Regular     Neuro/Psych  Headaches, Depression negative neurological ROS  negative psych ROS   GI/Hepatic negative GI ROS, Neg liver ROS,   Endo/Other  negative endocrine ROSdiabetes, Type 2, Oral Hypoglycemic Agents  Renal/GU negative Renal ROS  negative genitourinary   Musculoskeletal   Abdominal Normal abdominal exam  (+)   Peds negative pediatric ROS (+)  Hematology negative hematology ROS (+)   Anesthesia Other Findings Past Medical History: No date: Cancer ([place]) No date: Depression No date: Depression No date: Diabetes mellitus without complication (HCC)     Comment:  type 2 No date: Dissection of carotid artery (HCC)     Comment:  a. 06/2015 CT Head: abnl thickening of mid-dist R               cervical ICA w/o narrowing - ? vasculitis and               thrombosed/healing dissection; b. 01/2021 Carotid U/S:               1-39% bilat ICA stenoses. No date: Eczema No date: Elevated PSA No date: Epidural hematoma (HCC)     Comment:  a. Age 81 - struck in head w/ baseball --> s/p               craniotomy. No date: Epidural hematoma (HCC) No date: 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, congenital bilateral: Hearing loss No date: Hemorrhoids 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 No date: Hypertension No date: Migraine No  date: Obesity No date: PONV (postoperative nausea and vomiting) 2020: Prostate cancer ([place]) No date: Rosacea No date: SDH (subdural hematoma) (HCC) No date: Sleep apnea No date: Spontaneous Subdural hematoma (HCC)     Comment:  a. Approx 2015 - eval in [place] - conservatively managed.  Past Surgical History: No date: ANKLE ARTHROSCOPY; Left     Comment:  fracture 2007: CHOLECYSTECTOMY 1958: CRANIOTOMY     Comment:  left frontal craniotomy for epidural hematoma 04/07/2022: HIP ARTHROPLASTY; Right     Comment:  Procedure: ARTHROPLASTY BIPOLAR HIP (HEMIARTHROPLASTY);               Surgeon: Earnestine Leys, MD;  Location: [facility] ORS;                Service: Orthopedics;  Laterality: Right; 01/02/2022: INSERTION OF MESH     Comment:  Procedure: INSERTION OF MESH;  Surgeon: Herbert Pun, MD;  Location: [facility] ORS;  Service: General;; No date: LIGAMENT REPAIR; Right     Comment:  thumb No date: RETINAL DETACHMENT SURGERY 2006: TREATMENT FISTULA ANAL  BMI    Body Mass Index: 20.03 kg/m      Reproductive/Obstetrics negative OB ROS  Anesthesia Physical Anesthesia Plan  ASA: 3  Anesthesia Plan: General   Post-op Pain Management:    Induction: Intravenous  PONV Risk Score and Plan: Propofol infusion and TIVA  Airway Management Planned: Natural Airway and Mask  Additional Equipment:   Intra-op Plan:   Post-operative Plan:   Informed Consent: I have reviewed the patients History and Physical, chart, labs and discussed the procedure including the risks, benefits and alternatives for the proposed anesthesia with the patient or authorized representative who has indicated his/her understanding and acceptance.     Dental Advisory Given  Plan Discussed with: CRNA and Surgeon  Anesthesia Plan Comments:         Anesthesia Quick Evaluation

## 2022-07-29 NOTE — Progress Notes (Signed)
Pt presents with depressed mood,affect congruent. John [name] reports he is doing better, he states '' yes I think I am getting better. I do think the ECT is helping. I still just worry, and have trouble collecting my thoughts. ''  Patient does continue to exhibit some thought blocking when certain open ended questions are asked, he pauses but his speech is markedly improved, as he is more coherent and speaks in appropriate tone. Patient is tending to his own adl's and sores on sacral area have improved. Barrier cream applied as well. Patient denies any SI or HI or A/V Hallucinations. Verbalized understanding that he is to go to ECT today and NPO status has been maintained, pt accepted pills with small sips of water.  Patient is observed in the dayroom, playing puzzle with peers at this time. Will con't to monitor. Pt is safe.

## 2022-07-30 DIAGNOSIS — F332 Major depressive disorder, recurrent severe without psychotic features: Secondary | ICD-10-CM | POA: Diagnosis not present

## 2022-07-30 LAB — GLUCOSE, CAPILLARY
Glucose-Capillary: 95 mg/dL (ref 70–99)
Glucose-Capillary: 110 mg/dL — ABNORMAL HIGH (ref 70–99)
Glucose-Capillary: 193 mg/dL — ABNORMAL HIGH (ref 70–99)
Glucose-Capillary: 87 mg/dL (ref 70–99)

## 2022-07-30 NOTE — BHH Counselor (Signed)
CSW spoke with the patient's wife, Yee Joss, [phone].  CSW again reviewed with the wife that patient has an aftercare appointment with [facility] for medications, a new appointment has been established for therapy and a home health nursing/PT/OT referral has been made.  CSW updated that home health providers will follow up within 48 hours of discharge.  CSW updated that patient is expected to discharge 07/31/2022.  Wife reports that she will pick patient up at [place].  Wife reports that she has not heard from [facility] and wished to state that the family is unhappy that the patient's primary pair of glasses have been lost.   Wife wanted update on patient's clothing.  CSW encouraged family to bring clothing if there were concerns that patient would not have any, however, if patient was in need CSW would utilize the clothing closet.  Wife confirms that patient has a RW at home and there is no need for an order for one.  Assunta Curtis, MSW, LCSW 07/30/2022 12:03 PM

## 2022-07-30 NOTE — BHH Group Notes (Signed)
Pt attended recreation group, with dance and music. Pt attended and was appropriate.

## 2022-07-30 NOTE — Progress Notes (Signed)
Patient ID: John [name], male   DOB: 1940-12-18, 81 y.o.   MRN: [id number] Pt presents with depressed mood,affect congruent. John [name] reports he is doing better, he states '' yes I think I am getting better.''  Patient does continue to exhibit some thought blocking when certain open ended questions are asked, he pauses but his speech is markedly improved, but he does forward more information when not discussing his mood. Patient states '' my son came down from [place] and my brother will be here to help me. '' Patient agrees he feels ready for discharge and states he feels he is improved. Pt is able to make his needs known. Does socialize more with peers at this time. Will con't to monitor. Pt is safe.

## 2022-07-30 NOTE — Progress Notes (Signed)
[facility] MD Progress Note  07/30/2022 1:49 PM John [name]  MRN:  [id number] Subjective: John [name] is seen on rounds.  He is doing better.  Nurses tell me that he is bedsores are healing pretty well.  He received his last ECT on Monday and is doing well.  He is taking his medications without any problems.  We talked about discharge tomorrow and I ordered home health nursing for PT and OT.  He tells me that he discussed the maintenance ECT with Dr. Weber Cooks.  Principal Problem: Severe recurrent major depression without psychotic features ([place]) Diagnosis: Principal Problem:   Severe recurrent major depression without psychotic features ([place]) Active Problems:   Major depressive disorder, recurrent severe without psychotic features ([place])   Pressure injury of skin  Total Time spent with patient: 15 minutes  Past Psychiatric History:  Patient has a history of past depression repeated episodes throughout his life.  1 prior suicide attempt.  Was admitted to the hospital in mid May here with depression.  Was not showing significant improvement over time and was referred for ECT.  Patient received a few ECT treatments which generally appeared to be tolerated well.  This past weekend however he decompensated for unclear reasons at the very end of Sunday and prior to treatment on Monday was seen to be delirious with an arrhythmia.  Arrhythmia has stabilized.  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 06/2015 CT Head: abnl thickening of mid-dist R cervical ICA w/o narrowing - ? vasculitis and thrombosed/healing dissection; b. 01/2021 Carotid U/S: 1-39% bilat ICA stenoses.   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 Age 81 - struck in head w/ baseball --> s/p craniotomy.   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 Approx 2015 - eval in [place] - conservatively managed.    Past Surgical History:  Procedure Laterality Date   ANKLE ARTHROSCOPY Left    fracture   CHOLECYSTECTOMY  2007   [place]   left frontal craniotomy for epidural hematoma   HIP ARTHROPLASTY Right 04/07/2022   Procedure: ARTHROPLASTY BIPOLAR HIP (HEMIARTHROPLASTY);  Surgeon: Earnestine Leys, MD;  Location: [facility] ORS;  Service: Orthopedics;  Laterality: Right;   INSERTION OF MESH  01/02/2022   Procedure: INSERTION OF MESH;  Surgeon: Herbert Pun, MD;  Location: [facility] ORS;  Service: General;;   LIGAMENT REPAIR Right    thumb   RETINAL DETACHMENT SURGERY     TREATMENT FISTULA ANAL  2006   Family History:  Family History  Problem Relation Age of Onset   Osteoporosis Mother    Depression Mother    Prostate cancer Father    Hypertension Father    Diabetes Brother    Kidney disease Neg Hx     Social History:  Social History   Substance and  Sexual Activity  Alcohol Use Not Currently     Social History   Substance and Sexual Activity  Drug Use No    Social History   Socioeconomic History   Marital status: Married    Spouse name: [name]   Number of children: 2   Years of education: Not on file   Highest education level: Master's degree (e.g., MA, MS, MEng, MEd, MSW, MBA)  Occupational History   Not on file  Tobacco Use   Smoking status: Never   Smokeless tobacco: Never  Vaping Use   Vaping Use: Never used  Substance and Sexual Activity   Alcohol use: Not Currently   Drug use: No   Sexual activity: Yes    Birth control/protection: None  Other Topics Concern   Not on file  Social History Narrative    Live at home with wife.    Social Determinants of Health   Financial Resource Strain: Not on file  Food Insecurity: Food Insecurity Present (07/09/2022)   Hunger Vital Sign    Worried About Running Out of Food in the Last Year: Sometimes true    Ran Out of Food in the Last Year: Sometimes true  Transportation Needs: No Transportation Needs (07/09/2022)   PRAPARE - Hydrologist (Medical): No    Lack of Transportation (Non-Medical): No  Physical Activity: Not on file  Stress: Not on file  Social Connections: Not on file   Additional Social History:                         Sleep: Good  Appetite:  Good  Current Medications: Current Facility-Administered Medications  Medication Dose Route Frequency Provider Last Rate Last Admin   acetaminophen (TYLENOL) tablet 650 mg  650 mg Oral Q6H PRN Patrecia Pour, NP   650 mg at 07/20/22 0902   alum & mag hydroxide-simeth (MAALOX/MYLANTA) 200-200-20 MG/5ML suspension 30 mL  30 mL Oral Q4H PRN Patrecia Pour, NP       ascorbic acid (VITAMIN C) tablet 500 mg  500 mg Oral BID Parks Ranger, DO   500 mg at 07/30/22 1856   aspirin EC tablet 81 mg  81 mg Oral Daily Patrecia Pour, NP   81 mg at 07/30/22 0859   buPROPion (WELLBUTRIN XL) 24 hr tablet 300 mg  300 mg Oral Daily Patrecia Pour, NP   300 mg at 07/30/22 0859   docusate sodium (COLACE) capsule 100 mg  100 mg Oral BID Patrecia Pour, NP   100 mg at 07/30/22 0858   fentaNYL (SUBLIMAZE) injection 25 mcg  25 mcg Intravenous Q5 min PRN [place] Service, Gijsbertus F, MD       insulin aspart (novoLOG) injection 0-6 Units  0-6 Units Subcutaneous TID WC Parks Ranger, DO   2 Units at 07/29/22 1621   liver oil-zinc oxide (DESITIN) 40 % ointment   Topical BID Parks Ranger, DO   Given at 07/30/22 0858   magnesium hydroxide (MILK OF MAGNESIA) suspension 30 mL  30 mL Oral Daily PRN Patrecia Pour, NP       methocarbamol (ROBAXIN) tablet  500 mg  500 mg Oral Q6H PRN Patrecia Pour, NP       modafinil (PROVIGIL) tablet 100 mg  100 mg Oral QPC breakfast Parks Ranger, DO   100 mg at 07/30/22 0858   multivitamin with minerals tablet 1 tablet  1 tablet  Oral Daily Parks Ranger, DO   1 tablet at 07/30/22 0858   OLANZapine (ZYPREXA) tablet 10 mg  10 mg Oral QHS Parks Ranger, DO   10 mg at 07/29/22 2111   ondansetron (ZOFRAN) injection 4 mg  4 mg Intravenous Once PRN [place] Service, Jane Canary, MD       polyethylene glycol (MIRALAX / GLYCOLAX) packet 17 g  17 g Oral Daily PRN Patrecia Pour, NP       protein supplement (ENSURE MAX) liquid  11 oz Oral BID Parks Ranger, DO   11 oz at 07/29/22 2115   senna (SENOKOT) tablet 8.6 mg  1 tablet Oral BID Patrecia Pour, NP   8.6 mg at 07/30/22 0859   sertraline (ZOLOFT) tablet 50 mg  50 mg Oral QHS Patrecia Pour, NP   50 mg at 07/29/22 2111   traZODone (DESYREL) tablet 50 mg  50 mg Oral QHS Parks Ranger, DO   50 mg at 07/29/22 2111    Lab Results:  Results for orders placed or performed during the hospital encounter of 07/04/22 (from the past 48 hour(s))  Glucose, capillary     Status: Abnormal   Collection Time: 07/28/22  4:14 PM  Result Value Ref Range   Glucose-Capillary 134 (H) 70 - 99 mg/dL    Comment: Glucose reference range applies only to samples taken after fasting for at least 8 hours.  Glucose, capillary     Status: Abnormal   Collection Time: 07/28/22  8:06 PM  Result Value Ref Range   Glucose-Capillary 126 (H) 70 - 99 mg/dL    Comment: Glucose reference range applies only to samples taken after fasting for at least 8 hours.  Glucose, capillary     Status: None   Collection Time: 07/29/22  7:39 AM  Result Value Ref Range   Glucose-Capillary 90 70 - 99 mg/dL    Comment: Glucose reference range applies only to samples taken after fasting for at least 8 hours.  Glucose, capillary     Status: Abnormal   Collection Time:  07/29/22  4:04 PM  Result Value Ref Range   Glucose-Capillary 218 (H) 70 - 99 mg/dL    Comment: Glucose reference range applies only to samples taken after fasting for at least 8 hours.  Glucose, capillary     Status: None   Collection Time: 07/29/22  8:05 PM  Result Value Ref Range   Glucose-Capillary 77 70 - 99 mg/dL    Comment: Glucose reference range applies only to samples taken after fasting for at least 8 hours.  Glucose, capillary     Status: None   Collection Time: 07/30/22  8:29 AM  Result Value Ref Range   Glucose-Capillary 95 70 - 99 mg/dL    Comment: Glucose reference range applies only to samples taken after fasting for at least 8 hours.  Glucose, capillary     Status: Abnormal   Collection Time: 07/30/22 11:48 AM  Result Value Ref Range   Glucose-Capillary 110 (H) 70 - 99 mg/dL    Comment: Glucose reference range applies only to samples taken after fasting for at least 8 hours.    Blood Alcohol level:  Lab Results  Component Value Date   [facility] ([facility]) <10 07/02/2022   ETH <10 67/61/9509    Metabolic Disorder Labs: Lab Results  Component Value Date   HGBA1C 5.6 03/19/2022   MPG 114.02 03/19/2022   No results found for: "PROLACTIN" Lab Results  Component  Value Date   CHOL 131 03/19/2022   TRIG 92 03/19/2022   HDL 33 (L) 03/19/2022   CHOLHDL 4.0 03/19/2022   VLDL 18 03/19/2022   LDLCALC 80 03/19/2022    Physical Findings: AIMS:  , ,  ,  ,    CIWA:    COWS:     Musculoskeletal: Strength & Muscle Tone: within normal limits Gait & Station: normal Patient leans: N/A  Psychiatric Specialty Exam:  Presentation  General Appearance:  Appropriate for Environment; Casual  Eye Contact: Fleeting  Speech: Garbled  Speech Volume: Decreased  Handedness: Right   Mood and Affect  Mood: Anxious; Depressed  Affect: Flat   Thought Process  Thought Processes: Linear; Coherent; Goal Directed  Descriptions of  Associations:Loose  Orientation:Partial  Thought Content:Scattered  History of Schizophrenia/Schizoaffective disorder:No  Duration of Psychotic Symptoms:No data recorded Hallucinations:No data recorded Ideas of Reference:None  Suicidal Thoughts:No data recorded Homicidal Thoughts:No data recorded  Sensorium  Memory: Recent Poor; Remote Poor  Judgment: Poor  Insight: Fair   Community education officer  Concentration: Fair  Attention Span: Poor  Recall: Poor  Fund of Knowledge: Poor  Language: Poor   Psychomotor Activity  Psychomotor Activity:No data recorded  Assets  Assets: Financial Resources/Insurance   Sleep  Sleep:No data recorded   Physical Exam: Physical Exam Vitals and nursing note reviewed.  Constitutional:      Appearance: Normal appearance. He is normal weight.  Neurological:     General: No focal deficit present.     Mental Status: He is alert and oriented to person, place, and time.  Psychiatric:        Attention and Perception: Attention and perception normal.        Mood and Affect: Mood and affect normal.        Speech: Speech normal.        Behavior: Behavior normal. Behavior is cooperative.        Thought Content: Thought content normal.        Cognition and Memory: Cognition and memory normal.        Judgment: Judgment normal.    Review of Systems  Constitutional: Negative.   HENT: Negative.    Eyes: Negative.   Respiratory: Negative.    Cardiovascular: Negative.   Gastrointestinal: Negative.   Genitourinary: Negative.   Musculoskeletal: Negative.   Skin: Negative.   Neurological: Negative.   Endo/Heme/Allergies: Negative.   Psychiatric/Behavioral: Negative.     Blood pressure 127/79, pulse 63, temperature 97.8 F (36.6 C), resp. rate 18, height '6\' 2"'$  (1.88 m), weight 70.8 kg, SpO2 100 %. Body mass index is 20.03 kg/m.   Treatment Plan Summary: Daily contact with patient to assess and evaluate symptoms and progress  in treatment, Medication management, and Plan continue current medications.  Discharge tomorrow.  Parks Ranger, DO 07/30/2022, 1:49 PM

## 2022-07-30 NOTE — Group Note (Signed)
[facility] LCSW Group Therapy Note   Group Date: 07/30/2022 Start Time: 1100 End Time: 1200   Type of Therapy and Topic: Group Therapy: Avoiding Self-Sabotaging and Enabling Behaviors  Participation Level: Minimal  Mood: Blunted   Description of Group:  In this group, patients will learn how to identify obstacles, self-sabotaging and enabling behaviors, as well as: what are they, why do we do them and what needs these behaviors meet. Discuss unhealthy relationships and how to have positive healthy boundaries with those that sabotage and enable. Explore aspects of self-sabotage and enabling in yourself and how to limit these self-destructive behaviors in everyday life.   Therapeutic Goals: 1. Patient will identify one obstacle that relates to self-sabotage and enabling behaviors 2. Patient will identify one personal self-sabotaging or enabling behavior they did prior to admission 3. Patient will state a plan to change the above identified behavior 4. Patient will demonstrate ability to communicate their needs through discussion and/or role play.    Summary of Patient Progress: Patient was present for the entirety of group session. Patient participated in opening and closing remarks. However, patient did not contribute at all to the topic of discussion despite encouraged participation.    Therapeutic Modalities:  Cognitive Behavioral Therapy Person-Centered Therapy Motivational Interviewing    Durenda Hurt, [place]

## 2022-07-30 NOTE — BHH Counselor (Signed)
CSW met with the patient to review recommendations from PT for the patient to have a RW with 2 wheels.  Patient reports that he has this at home and is not in need for a new one to be ordered.    CSW to follow up with spouse.   Assunta Curtis, MSW, LCSW 07/30/2022 11:43 AM

## 2022-07-30 NOTE — Plan of Care (Signed)
  Problem: Coping: Goal: Ability to adjust to condition or change in health will improve Outcome: Progressing   Problem: Fluid Volume: Goal: Ability to maintain a balanced intake and output will improve Outcome: Progressing   Problem: Health Behavior/Discharge Planning: Goal: Ability to identify and utilize available resources and services will improve Outcome: Progressing Patient compliant with treatment plan, ambulating with front wheel walker adhering to all fall protocol. Support and encouragement provided. Patient remains safe. Q 15 minutes safety checks ongoing.

## 2022-07-30 NOTE — BHH Group Notes (Signed)
PsychoEducational Group - Four poems were read, regarding positive reframing, inspiration , and how perspective can impact our belief and motivation. Patients were then encouraged to discuss ways in which they would like to change/modify negative behavioral patterns, through discussion with the poems.  Patient participated minimally, was really unable to share much even when called upon.

## 2022-07-31 DIAGNOSIS — F332 Major depressive disorder, recurrent severe without psychotic features: Secondary | ICD-10-CM | POA: Diagnosis not present

## 2022-07-31 LAB — GLUCOSE, CAPILLARY
Glucose-Capillary: 87 mg/dL (ref 70–99)
Glucose-Capillary: 105 mg/dL — ABNORMAL HIGH (ref 70–99)

## 2022-07-31 MED ORDER — TRAZODONE HCL 50 MG PO TABS: 50.0000 mg | ORAL_TABLET | Freq: Every day | ORAL | 3 refills | Status: DC

## 2022-07-31 MED ORDER — OLANZAPINE 10 MG PO TABS: 10.0000 mg | ORAL_TABLET | Freq: Every day | ORAL | 3 refills | Status: DC

## 2022-07-31 MED ORDER — MODAFINIL 100 MG PO TABS: 100.0000 mg | ORAL_TABLET | Freq: Every day | ORAL | 3 refills | Status: DC

## 2022-07-31 MED ORDER — DOCUSATE SODIUM 100 MG PO CAPS: 100.0000 mg | ORAL_CAPSULE | Freq: Two times a day (BID) | ORAL | 3 refills | Status: AC

## 2022-07-31 MED ORDER — SERTRALINE HCL 50 MG PO TABS: 50.0000 mg | ORAL_TABLET | Freq: Every day | ORAL | 0 refills | Status: DC

## 2022-07-31 NOTE — Progress Notes (Signed)
Patient denies SI, HI, and AVH. He does not endorse any pain or other physical problems. Patient was educated on prescriptions, follow up care, and medications. Pt questions were answered and pt verbalized understanding and did not voice any concerns. Pt's belongings were returned. Patient was not observed to be in distress at time of discharge. He was safety discharged to the [facility].

## 2022-07-31 NOTE — Progress Notes (Signed)
  [facility] Adult Case Management Discharge Plan :  Will you be returning to the same living situation after discharge:  Yes,  home At discharge, do you have transportation home?: Yes,  wife reports that she will provide transportation.  Do you have the ability to pay for your medications: Yes,  Medicare Part A and B  Release of information consent forms completed and in the chart;  Patient's signature needed at discharge.  Patient to Follow up at:  Follow-up Information     [facility] Follow up.   Specialty: Behavioral Health Why: Appointment is scheduled for 08/26/2022 at 10:00AM. Please arrive at 9:45AM. Contact information: [place] [phone]        [facility] Follow up.   Why: Appointment is scheduled for 08/06/2022 at 1:30 PM.  Please bring ID and insurance card.  This is a mental health therapy appointment. Contact information: Tel: [phone] Fax: [address] [place], [place] [address]        [facility] Follow up.   Why: Home Health Physical Therapy (PT) and Occupational Therapy (OT) and nursing.  They will contact you within 48 hours after discharge. Contact information: [place] [address] [phone]                 Next level of care provider has access to [place] and Suicide Prevention discussed: Yes,  SPE completed with the patient and his spouse.      Has patient been referred to the Quitline?: N/A patient is not a smoker  Patient has been referred for addiction treatment: [place], LCSW 07/31/2022, 11:10 AM

## 2022-07-31 NOTE — Care Management Important Message (Signed)
Important Message  Patient Details  Name: Hurman Ketelsen MRN: [id number] Date of Birth: [birth year]   Medicare Important Message Given:  Yes     Rozann Lesches, LCSW 07/31/2022, 11:31 AM

## 2022-07-31 NOTE — BHH Suicide Risk Assessment (Signed)
[facility] Discharge Suicide Risk Assessment   Principal Problem: Severe recurrent major depression without psychotic features [facility]) Discharge Diagnoses: Principal Problem:   Severe recurrent major depression without psychotic features ([facility] [employer] [facility]) Active Problems:   Major depressive disorder, recurrent severe without psychotic features ([place])   Pressure injury of skin   Total Time spent with patient: 1 hour  Musculoskeletal: Strength & Muscle Tone: within normal limits Gait & Station: normal Patient leans: N/A  Psychiatric Specialty Exam  Presentation  General Appearance:  Appropriate for Environment; Casual  Eye Contact: Fleeting  Speech: Garbled  Speech Volume: Decreased  Handedness: Right   Mood and Affect  Mood: Anxious; Depressed  Duration of Depression Symptoms: Greater than two weeks  Affect: Flat   Thought Process  Thought Processes: Linear; Coherent; Goal Directed  Descriptions of Associations:Loose  Orientation:Partial  Thought Content:Scattered  History of Schizophrenia/Schizoaffective disorder:No  Duration of Psychotic Symptoms:No data recorded Hallucinations:No data recorded Ideas of Reference:None  Suicidal Thoughts:No data recorded Homicidal Thoughts:No data recorded  Sensorium  Memory: Recent Poor; Remote Poor  Judgment: Poor  Insight: Fair   Community education officer  Concentration: Fair  Attention Span: Poor  Recall: Poor  Fund of Knowledge: Poor  Language: Poor   Psychomotor Activity  Psychomotor Activity:No data recorded  Assets  Assets: Financial Resources/Insurance   Sleep  Sleep:No data recorded  Physical Exam: Physical Exam ROS Blood pressure 119/76, pulse 60, temperature (!) 97.5 F (36.4 C), resp. rate 16, height '6\' 2"'$  (1.88 m), weight 70.8 kg, SpO2 100 %. Body mass index is 20.03 kg/m.  Mental Status Per Nursing Assessment::   On Admission:  NA  Demographic Factors:  Male, Age 81 or older, and  Caucasian  Loss Factors: NA  Historical Factors: Prior suicide attempts  Risk Reduction Factors:   Sense of responsibility to family, Living with another person, especially a relative, Positive social support, Positive therapeutic relationship, and Positive coping skills or problem solving skills  Continued Clinical Symptoms:  Depression:   Anhedonia  Cognitive Features That Contribute To Risk:  None    Suicide Risk:  Minimal: No identifiable suicidal ideation.  Patients presenting with no risk factors but with morbid ruminations; may be classified as minimal risk based on the severity of the depressive symptoms   Follow-up Information     [facility] Follow up.   Specialty: Behavioral Health Why: Appointment is scheduled for 08/26/2022 at 10:00AM. Please arrive at 9:45AM. Contact information: [place] [phone]        [facility] Follow up.   Why: Appointment is scheduled for 08/06/2022 at 1:30 PM.  Please bring ID and insurance card.  This is a mental health therapy appointment. Contact information: Tel: [phone] Fax: [address] [place], [place] [address]        [facility] Follow up.   Why: Home Health Physical Therapy (PT) and Occupational Therapy (OT) and nursing.  They will contact you within 48 hours after discharge. Contact information: [place] [address] [phone]                 Plan Of Care/Follow-up recommendations:  [place], DO 07/31/2022, 10:19 AM

## 2022-07-31 NOTE — Discharge Summary (Signed)
Physician Discharge Summary Note  Patient:  John [name] is an 81 y.o., male MRN:  [id number] DOB:  Mar 10, 1941 Patient phone:  [phone] (home)  Patient address:   [place] [address],  Total Time spent with patient: 1 hour  Date of Admission:  07/04/2022 Date of Discharge: 07/31/2022  Reason for Admission:  [name] is a 81 year old white male who was voluntarily admitted to geriatric psychiatry for worsening depression.  He was discharged from our unit approximately 2 months ago and it sounds like he has not been compliant with his medications.  He is basically nonverbal and is very slow to answer questions.  Most of the history was taken from his chart, notes, in the ER.  Last hospitalization he fell a lot and ended up breaking his hip which was repaired.  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   PER INITIAL INTAKE:  81 yo male who recently discharged from the hospital after an extended stay for depression and anxiety.  He has had many PCP and psychiatry calls since discharge related to his mood.  He has progressively decompensated and is currently confused and nonverbal.  Very anxious on assessment with attempts to talk with no success. His wife is at his bedside providing information along with chart review.  She wants him admitted to [facility] so she can see him regularly.  His appetite and sleep have been good per wife. She noted he was confused and concerned about his medicines to the point of stopping them.  Based on his presentation, a higher level of care may be needed to properly care for him.  For now, psych admission recommended for stabilization.  Principal Problem: Severe recurrent major depression without psychotic features [facility]) Discharge Diagnoses: Principal Problem:   Severe recurrent major depression without psychotic features ([place]) Active Problems:   Major depressive disorder, recurrent severe without psychotic features ([place])   Pressure injury  of skin   Past Psychiatric History: Patient has a history of past depression repeated episodes throughout his life.  1 prior suicide attempt.  Was admitted to the hospital in mid May here with depression.  Was not showing significant improvement over time and was referred for ECT.  Patient received a few ECT treatments which generally appeared to be tolerated well.  This past weekend however he decompensated for unclear reasons at the very end of Sunday and prior to treatment on Monday was seen to be delirious with an arrhythmia.  Arrhythmia has stabilized.  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 06/2015 CT Head: abnl thickening of mid-dist R cervical ICA w/o narrowing - ? vasculitis and thrombosed/healing dissection; b. 01/2021 Carotid U/S: 1-39% bilat ICA stenoses.   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 Age 38 - struck in head w/ baseball --> s/p craniotomy.   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 Approx 2015 - eval in [place] - conservatively managed.    Past Surgical History:  Procedure Laterality Date   ANKLE ARTHROSCOPY Left    fracture   CHOLECYSTECTOMY  2007   [place]   left frontal craniotomy for epidural hematoma   HIP ARTHROPLASTY Right 04/07/2022   Procedure: ARTHROPLASTY BIPOLAR HIP (HEMIARTHROPLASTY);  Surgeon: Earnestine Leys, MD;  Location: [facility] ORS;  Service: Orthopedics;  Laterality: Right;   INSERTION OF MESH  01/02/2022   Procedure: INSERTION OF MESH;   Surgeon: Herbert Pun, MD;  Location: [facility] ORS;  Service: General;;   LIGAMENT REPAIR Right    thumb   RETINAL DETACHMENT SURGERY     TREATMENT FISTULA ANAL  2006   Family History:  Family History  Problem Relation Age of Onset   Osteoporosis Mother    Depression Mother    Prostate cancer Father    Hypertension Father    Diabetes Brother    Kidney disease Neg Hx    Family Psychiatric  History: Unremarkable Social History:  Social History   Substance and Sexual Activity  Alcohol Use Not Currently     Social History   Substance and Sexual Activity  Drug Use No    Social History   Socioeconomic History   Marital status: Married    Spouse name: [name]   Number of children: 2   Years of education: Not on file   Highest education level: Master's degree (e.g., MA, MS, MEng, MEd, MSW, MBA)  Occupational History   Not on file  Tobacco Use   Smoking status: Never   Smokeless tobacco: Never  Vaping Use   Vaping Use: Never used  Substance and Sexual Activity   Alcohol use: Not Currently   Drug use: No   Sexual activity: Yes    Birth control/protection: None  Other Topics Concern   Not on file  Social History Narrative   Live at home with wife.    Social Determinants of Health   Financial Resource Strain: Not on file  Food Insecurity: Food Insecurity Present (07/09/2022)   Hunger Vital Sign    Worried About Running Out of Food in the Last Year: Sometimes true    Ran Out of Food in the Last Year: Sometimes true  Transportation Needs: No Transportation Needs (07/09/2022)   PRAPARE - Hydrologist (Medical): No    Lack of Transportation (Non-Medical): No  Physical Activity: Not on file  Stress: Not on file  Social Connections: Not on file    Hospital Course: Demetric was admitted voluntarily to geriatric psychiatry for worsening depression.  It is unclear as to whether he had been taking his medications on a regular basis.  His last  admission ECT was very helpful so we initiated ECT again.  He did very well with it.  Zoloft was continued at 50 mg at bedtime.  Zyprexa 10 mg at bedtime was added.  Initially he was nonresponsive due to avolition.  After medication initiation and ECT he responded positively and started to walk on his own and talk and was more interactive with staff and peers.  He was able to smile and laugh and his mood and affect improved.  Provigil 100 mg was added in the morning due to his lack of motivation and daytime sleepiness.  Trazodone worked well for sleep and he did not have any falling episodes.  His blood pressure  medicine was discontinued because his blood pressure normalized on its own.  His Flomax was discontinued because the nurses reported that he was urinating a lot at night which was contributing to his bedsores.  Dr. Weber Cooks recommended maintenance ECT upon discharge.  He was felt that he maximize hospitalization he was discharged home.  On the day of discharge she denied suicidal ideation, homicidal ideation, auditory or visual hallucinations.  His judgment and insight were good.  Physical Findings: AIMS:  , ,  ,  ,    CIWA:    COWS:     Musculoskeletal: Strength & Muscle Tone: within normal limits Gait & Station: normal Patient leans: N/A   Psychiatric Specialty Exam:  Presentation  General Appearance:  Appropriate for Environment; Casual  Eye Contact: Fleeting  Speech: Garbled  Speech Volume: Decreased  Handedness: Right   Mood and Affect  Mood: Anxious; Depressed  Affect: Flat   Thought Process  Thought Processes: Linear; Coherent; Goal Directed  Descriptions of Associations:Loose  Orientation:Partial  Thought Content:Scattered  History of Schizophrenia/Schizoaffective disorder:No  Duration of Psychotic Symptoms:No data recorded Hallucinations:No data recorded Ideas of Reference:None  Suicidal Thoughts:No data recorded Homicidal Thoughts:No data  recorded  Sensorium  Memory: Recent Poor; Remote Poor  Judgment: Poor  Insight: Fair   Community education officer  Concentration: Fair  Attention Span: Poor  Recall: Poor  Fund of Knowledge: Poor  Language: Poor   Psychomotor Activity  Psychomotor Activity:No data recorded  Assets  Assets: Financial Resources/Insurance   Sleep  Sleep:No data recorded   Physical Exam: Physical Exam Vitals and nursing note reviewed.  Constitutional:      Appearance: Normal appearance. He is normal weight.  Neurological:     General: No focal deficit present.     Mental Status: He is alert and oriented to person, place, and time.  Psychiatric:        Attention and Perception: Attention and perception normal.        Mood and Affect: Mood and affect normal.        Speech: Speech normal.        Behavior: Behavior normal. Behavior is cooperative.        Thought Content: Thought content normal.        Cognition and Memory: Cognition and memory normal.        Judgment: Judgment normal.    Review of Systems  Constitutional: Negative.   HENT: Negative.    Eyes: Negative.   Respiratory: Negative.    Cardiovascular: Negative.   Gastrointestinal: Negative.   Genitourinary: Negative.   Musculoskeletal: Negative.   Skin: Negative.   Neurological: Negative.   Endo/Heme/Allergies: Negative.   Psychiatric/Behavioral: Negative.     Blood pressure 119/76, pulse 60, temperature (!) 97.5 F (36.4 C), resp. rate 16, height '6\' 2"'$  (1.88 m), weight 70.8 kg, SpO2 100 %. Body mass index is 20.03 kg/m.   Social History   Tobacco Use  Smoking Status Never  Smokeless Tobacco Never   Tobacco Cessation:  N/A, patient does not currently use tobacco products   Blood Alcohol level:  Lab Results  Component Value Date   ETH <10 07/02/2022   ETH <10 78/29/5621    Metabolic Disorder Labs:  Lab Results  Component Value Date   HGBA1C 5.6 03/19/2022   MPG 114.02 03/19/2022   No  results found for: "PROLACTIN" Lab Results  Component Value Date   CHOL 131 03/19/2022   TRIG 92 03/19/2022   HDL 33 (L) 03/19/2022   CHOLHDL  4.0 03/19/2022   VLDL 18 03/19/2022   LDLCALC 80 03/19/2022    See Psychiatric Specialty Exam and Suicide Risk Assessment completed by Attending Physician prior to discharge.  Discharge destination:  Home  Is patient on multiple antipsychotic therapies at discharge:  No   Has Patient had three or more failed trials of antipsychotic monotherapy by history:  No  Recommended Plan for Multiple Antipsychotic Therapies: NA   Allergies as of 07/31/2022   No Known Allergies      Medication List     STOP taking these medications    methocarbamol 500 MG tablet Commonly known as: ROBAXIN   metoprolol tartrate 25 MG tablet Commonly known as: LOPRESSOR   polyethylene glycol 17 g packet Commonly known as: MIRALAX / GLYCOLAX   senna 8.6 MG Tabs tablet Commonly known as: SENOKOT   tamsulosin 0.4 MG Caps capsule Commonly known as: FLOMAX       TAKE these medications      Indication  aspirin EC 81 MG tablet Take 81 mg by mouth daily.    buPROPion 300 MG 24 hr tablet Commonly known as: WELLBUTRIN XL Take 1 tablet by mouth daily.    docusate sodium 100 MG capsule Commonly known as: COLACE Take 1 capsule (100 mg total) by mouth 2 (two) times daily.    modafinil 100 MG tablet Commonly known as: PROVIGIL Take 1 tablet (100 mg total) by mouth daily after breakfast. Start taking on: August 01, 2022  Indication: Excessive Daytime Sleepiness, Major Depressive Disorder   OLANZapine 10 MG tablet Commonly known as: ZYPREXA Take 1 tablet (10 mg total) by mouth at bedtime.  Indication: Major Depressive Disorder   sertraline 50 MG tablet Commonly known as: ZOLOFT Take 1 tablet (50 mg total) by mouth at bedtime. What changed: Another medication with the same name was removed. Continue taking this medication, and follow the directions  you see here.  Indication: Major Depressive Disorder   traZODone 50 MG tablet Commonly known as: DESYREL Take 1 tablet (50 mg total) by mouth at bedtime. What changed:  when to take this reasons to take this  Indication: Trouble Sleeping, Major Depressive Disorder        Follow-up [facility] Follow up.   Specialty: Behavioral Health Why: Appointment is scheduled for 08/26/2022 at 10:00AM. Please arrive at 9:45AM. Contact information: [place] [phone]        [facility] Follow up.   Why: Appointment is scheduled for 08/06/2022 at 1:30 PM.  Please bring ID and insurance card.  This is a mental health therapy appointment. Contact information: Tel: [phone] Fax: [address] [place], [place] [address]        [facility] Follow up.   Why: Home Health Physical Therapy (PT) and Occupational Therapy (OT) and nursing.  They will contact you within 48 hours after discharge. Contact information: [address] [address] [phone]                 Follow-up recommendations:  [facility] and ECT  Comments:  Home Health  Signed: Parks Ranger, DO 07/31/2022, 10:33 AM

## 2022-07-31 NOTE — Progress Notes (Signed)
2130 - D: Pt alert and oriented. Pt denies experiencing any anxiety/depression at time of assessment, denies experiencing any SI/HI, or AVH, and  denies experiencing any pain or discomfort.  A: Scheduled medications administered, support and encouragement provided. Routine safety checks conducted q15 minutes.   R: No adverse drug reactions noted. Pt remains complaint with medication and interacts appropriately with others on the unit. Pt. Is agreeable to notifying staff with any safety concerns, plan of care cont'd.

## 2022-07-31 NOTE — BH IP Treatment Plan (Signed)
Interdisciplinary Treatment and Diagnostic Plan Update  07/31/2022 Time of Session: 8:30AM John [name] MRN: [id number]  Principal Diagnosis: Severe recurrent major depression without psychotic features [facility])  Secondary Diagnoses: Principal Problem:   Severe recurrent major depression without psychotic features ([place]) Active Problems:   Major depressive disorder, recurrent severe without psychotic features ([place])   Pressure injury of skin   Current Medications:  Current Facility-Administered Medications  Medication Dose Route Frequency Provider Last Rate Last Admin   acetaminophen (TYLENOL) tablet 650 mg  650 mg Oral Q6H PRN Patrecia Pour, NP   650 mg at 07/20/22 0902   alum & mag hydroxide-simeth (MAALOX/MYLANTA) 200-200-20 MG/5ML suspension 30 mL  30 mL Oral Q4H PRN Patrecia Pour, NP       ascorbic acid (VITAMIN C) tablet 500 mg  500 mg Oral BID Parks Ranger, DO   500 mg at 07/31/22 9892   aspirin EC tablet 81 mg  81 mg Oral Daily Patrecia Pour, NP   81 mg at 07/31/22 0835   buPROPion (WELLBUTRIN XL) 24 hr tablet 300 mg  300 mg Oral Daily Patrecia Pour, NP   300 mg at 07/31/22 1194   docusate sodium (COLACE) capsule 100 mg  100 mg Oral BID Patrecia Pour, NP   100 mg at 07/31/22 0835   fentaNYL (SUBLIMAZE) injection 25 mcg  25 mcg Intravenous Q5 min PRN [place] Service, Gijsbertus F, MD       insulin aspart (novoLOG) injection 0-6 Units  0-6 Units Subcutaneous TID WC Parks Ranger, DO   1 Units at 07/30/22 1622   liver oil-zinc oxide (DESITIN) 40 % ointment   Topical BID Parks Ranger, DO   Given at 07/31/22 1740   magnesium hydroxide (MILK OF MAGNESIA) suspension 30 mL  30 mL Oral Daily PRN Patrecia Pour, NP       methocarbamol (ROBAXIN) tablet 500 mg  500 mg Oral Q6H PRN Patrecia Pour, NP       modafinil (PROVIGIL) tablet 100 mg  100 mg Oral QPC breakfast Parks Ranger, DO   100 mg at 07/31/22 8144   multivitamin with minerals tablet 1  tablet  1 tablet Oral Daily Parks Ranger, DO   1 tablet at 07/31/22 0835   OLANZapine (ZYPREXA) tablet 10 mg  10 mg Oral QHS Parks Ranger, DO   10 mg at 07/30/22 2133   ondansetron (ZOFRAN) injection 4 mg  4 mg Intravenous Once PRN [place] Service, Gijsbertus F, MD       polyethylene glycol (MIRALAX / GLYCOLAX) packet 17 g  17 g Oral Daily PRN Patrecia Pour, NP       protein supplement (ENSURE MAX) liquid  11 oz Oral BID Parks Ranger, DO   11 oz at 07/31/22 0835   senna (SENOKOT) tablet 8.6 mg  1 tablet Oral BID Patrecia Pour, NP   8.6 mg at 07/31/22 0835   sertraline (ZOLOFT) tablet 50 mg  50 mg Oral QHS Patrecia Pour, NP   50 mg at 07/30/22 2133   traZODone (DESYREL) tablet 50 mg  50 mg Oral QHS Parks Ranger, DO   50 mg at 07/30/22 2133   PTA Medications: Medications Prior to Admission  Medication Sig Dispense Refill Last Dose   aspirin EC 81 MG tablet Take 81 mg by mouth daily.   07/14/2022   buPROPion (WELLBUTRIN XL) 300 MG 24 hr tablet Take 1 tablet by mouth daily.  07/14/2022   methocarbamol (ROBAXIN) 500 MG tablet Take 1 tablet (500 mg total) by mouth every 6 (six) hours as needed for muscle spasms. 30 tablet 0 07/14/2022   metoprolol tartrate (LOPRESSOR) 25 MG tablet Take 1 tablet (25 mg total) by mouth 2 (two) times daily. 60 tablet 3 07/14/2022   polyethylene glycol (MIRALAX / GLYCOLAX) 17 g packet Take 17 g by mouth daily as needed. 14 each 0 07/14/2022   senna (SENOKOT) 8.6 MG TABS tablet Take 1 tablet (8.6 mg total) by mouth 2 (two) times daily. 120 tablet 0 07/14/2022   sertraline (ZOLOFT) 50 MG tablet Take 1 tablet (50 mg total) by mouth daily. 30 tablet 1 07/14/2022   tamsulosin (FLOMAX) 0.4 MG CAPS capsule Take 1 capsule (0.4 mg total) by mouth daily after supper. 30 capsule 3 07/14/2022   traZODone (DESYREL) 50 MG tablet Take 1 tablet (50 mg total) by mouth at bedtime as needed for sleep. 30 tablet 3 07/14/2022   [DISCONTINUED] docusate  sodium (COLACE) 100 MG capsule Take 1 capsule (100 mg total) by mouth 2 (two) times daily. 10 capsule 0 07/14/2022   [DISCONTINUED] sertraline (ZOLOFT) 50 MG tablet Take 1 tablet (50 mg total) by mouth at bedtime. 30 tablet 0 07/14/2022    Patient Stressors: Marital or family conflict   Medication change or noncompliance    Patient Strengths: Scientist, research (life sciences)  Supportive family/friends   Treatment Modalities: Medication Management, Group therapy, Case management,  1 to 1 session with clinician, Psychoeducation, Recreational therapy.   Physician Treatment Plan for Primary Diagnosis: Severe recurrent major depression without psychotic features ([place]) Long Term Goal(s): Improvement in symptoms so as ready for discharge   Short Term Goals: Ability to identify changes in lifestyle to reduce recurrence of condition will improve Ability to verbalize feelings will improve Ability to disclose and discuss suicidal ideas Ability to demonstrate self-control will improve Ability to identify and develop effective coping behaviors will improve Ability to maintain clinical measurements within normal limits will improve Compliance with prescribed medications will improve Ability to identify triggers associated with substance abuse/mental health issues will improve  Medication Management: Evaluate patient's response, side effects, and tolerance of medication regimen.  Therapeutic Interventions: 1 to 1 sessions, Unit Group sessions and Medication administration.  Evaluation of Outcomes: Adequate for Discharge  Physician Treatment Plan for Secondary Diagnosis: Principal Problem:   Severe recurrent major depression without psychotic features ([place]) Active Problems:   Major depressive disorder, recurrent severe without psychotic features ([place])   Pressure injury of skin  Long Term Goal(s): Improvement in symptoms so as ready for discharge   Short Term Goals: Ability to identify changes in lifestyle to  reduce recurrence of condition will improve Ability to verbalize feelings will improve Ability to disclose and discuss suicidal ideas Ability to demonstrate self-control will improve Ability to identify and develop effective coping behaviors will improve Ability to maintain clinical measurements within normal limits will improve Compliance with prescribed medications will improve Ability to identify triggers associated with substance abuse/mental health issues will improve     Medication Management: Evaluate patient's response, side effects, and tolerance of medication regimen.  Therapeutic Interventions: 1 to 1 sessions, Unit Group sessions and Medication administration.  Evaluation of Outcomes: Adequate for Discharge   RN Treatment Plan for Primary Diagnosis: Severe recurrent major depression without psychotic features ([place]) Long Term Goal(s): Knowledge of disease and therapeutic regimen to maintain health will improve  Short Term Goals: Ability to demonstrate self-control, Ability to participate in decision  making will improve, Ability to verbalize feelings will improve, Ability to disclose and discuss suicidal ideas, Ability to identify and develop effective coping behaviors will improve, and Compliance with prescribed medications will improve  Medication Management: RN will administer medications as ordered by provider, will assess and evaluate patient's response and provide education to patient for prescribed medication. RN will report any adverse and/or side effects to prescribing provider.  Therapeutic Interventions: 1 on 1 counseling sessions, Psychoeducation, Medication administration, Evaluate responses to treatment, Monitor vital signs and CBGs as ordered, Perform/monitor CIWA, COWS, AIMS and Fall Risk screenings as ordered, Perform wound care treatments as ordered.  Evaluation of Outcomes: Adequate for Discharge   LCSW Treatment Plan for Primary Diagnosis: Severe recurrent  major depression without psychotic features ([place]) Long Term Goal(s): Safe transition to appropriate next level of care at discharge, Engage patient in therapeutic group addressing interpersonal concerns.  Short Term Goals: Engage patient in aftercare planning with referrals and resources, Increase social support, Increase ability to appropriately verbalize feelings, Increase emotional regulation, Facilitate acceptance of mental health diagnosis and concerns, and Increase skills for wellness and recovery  Therapeutic Interventions: Assess for all discharge needs, 1 to 1 time with Social worker, Explore available resources and support systems, Assess for adequacy in community support network, Educate family and significant other(s) on suicide prevention, Complete Psychosocial Assessment, Interpersonal group therapy.  Evaluation of Outcomes: Adequate for Discharge   Progress in Treatment: Attending groups: Yes. Participating in groups: Yes. Taking medication as prescribed: Yes. Toleration medication: Yes. Family/Significant other contact made: Yes, individual(s) contacted:  SPE completed with the patient's spouse. Patient understands diagnosis: Yes. Discussing patient identified problems/goals with staff: Yes. Medical problems stabilized or resolved: Yes. Denies suicidal/homicidal ideation: Yes. Issues/concerns per patient self-inventory: No. Other: none  New problem(s) identified: No, Describe:  none Update 07/10/2022:  No changes at this time. Update 07/15/2022:  No changes at this time.  Update 07/21/2022:  No changes at this time. Update 9/29/203:  No changes at this time. Update 07/31/2022:  No changes at this time.    New Short Term/Long Term Goal(s):  medication management for mood stabilization; elimination of SI thoughts; development of comprehensive mental wellness plan.  Update 07/10/2022:  No changes at this time. Update 07/15/2022:  No changes at this time. Update 07/21/2022:  No changes  at this time.  Update 9/29/203:  No changes at this time.  Update 07/31/2022:  No changes at this time.    Patient Goals:  "get better"Update 07/10/2022:  No changes at this time. Update 07/15/2022:  No changes at this time.  Update 07/21/2022:  No changes at this time. Update 9/29/203:  No changes at this time.  Update 07/31/2022:  No changes at this time.    Discharge Plan or Barriers: CSW to assist patient in development of appropriate discharge plans. Patient struggled with speech during treatment team. Update 07/10/2022:  Patient reports plans to return home.  Patient reports that he would like to continue with his current mental health provider.  Patient continues to work with PT/OT and has plans to begin ECT.  Changes to discharge treatment pending recommendations of other disciplines working with the patient. Update 07/15/2022:  No changes at this time. Update 07/21/2022:  Patient has begun ECT at this time.  Patient continues to progress.  Patient continues with PT and recommendations remain the same for the patient to return home with home health PT. Update 9/29/203:  CSW has scheduled patient for aftercare with [facility] for medication  management, as well as [facility] for therapy.  Home Health for PT and OT has been established.  CSW will arrange for [facility] once orders are placed.Update 07/31/2022:  Patient has an aftercare appointment, home health, and therapy appointment scheduled for aftercare.  Patient reports that he will return home.    Reason for Continuation of Hospitalization: Anxiety Depression Medical Issues Medication stabilization   Estimated Length of Stay:  1-7 days Update 07/10/2022:  No changes at this time.  Update 07/15/2022:  TBD  Update 9/29/203:  TBD Update 07/31/2022:  TBD   Last 3 [place] Suicide Severity Risk Score: Flowsheet Row Admission (Current) from 07/04/2022 in [place] ED from 07/03/2022 in [facility] Office Visit from 06/11/2022 in confidential department  C-SSRS RISK CATEGORY No Risk No Risk Error: Q3, 4, or 5 should not be populated when Q2 is No       Last PHQ 2/9 Scores:    06/11/2022    2:20 PM 05/20/2022    1:11 PM 12/21/2021    8:59 AM  Depression screen PHQ 2/9  Decreased Interest 3 1 0  Down, Depressed, Hopeless 1 1 0  PHQ - 2 Score 4 2 0  Altered sleeping 1 1   Tired, decreased energy 1 2   Change in appetite 2 1   Feeling bad or failure about yourself  1 1   Trouble concentrating 2 2   Moving slowly or fidgety/restless 2 2   Suicidal thoughts 0 0   PHQ-9 Score 13 11   Difficult doing work/chores Somewhat difficult      Scribe for Treatment Team: Rozann Lesches, LCSW 07/31/2022 11:38 AM

## 2022-08-08 ENCOUNTER — Emergency Department: Payer: Medicare Other

## 2022-08-08 ENCOUNTER — Other Ambulatory Visit: Payer: Self-pay

## 2022-08-08 ENCOUNTER — Emergency Department
Admission: EM | Admit: 2022-08-08 | Discharge: 2022-08-08 | Disposition: A | Discharge status: Home / Self Care | Payer: Medicare Other | Attending: Emergency Medicine | Admitting: Emergency Medicine

## 2022-08-08 DIAGNOSIS — G9389 Other specified disorders of brain: Secondary | ICD-10-CM | POA: Diagnosis not present

## 2022-08-08 DIAGNOSIS — Z7982 Long term (current) use of aspirin: Secondary | ICD-10-CM | POA: Insufficient documentation

## 2022-08-08 DIAGNOSIS — I10 Essential (primary) hypertension: Secondary | ICD-10-CM | POA: Diagnosis not present

## 2022-08-08 DIAGNOSIS — E119 Type 2 diabetes mellitus without complications: Secondary | ICD-10-CM | POA: Insufficient documentation

## 2022-08-08 DIAGNOSIS — Z79899 Other long term (current) drug therapy: Secondary | ICD-10-CM | POA: Diagnosis not present

## 2022-08-08 DIAGNOSIS — I480 Paroxysmal atrial fibrillation: Secondary | ICD-10-CM | POA: Diagnosis not present

## 2022-08-08 DIAGNOSIS — R200 Anesthesia of skin: Secondary | ICD-10-CM | POA: Diagnosis present

## 2022-08-08 DIAGNOSIS — Z8546 Personal history of malignant neoplasm of prostate: Secondary | ICD-10-CM | POA: Diagnosis not present

## 2022-08-08 DIAGNOSIS — I6782 Cerebral ischemia: Secondary | ICD-10-CM | POA: Diagnosis not present

## 2022-08-08 LAB — URINE DRUG SCREEN, QUALITATIVE (ARMC ONLY)
Amphetamines, Ur Screen: NOT DETECTED
Barbiturates, Ur Screen: NOT DETECTED
Benzodiazepine, Ur Scrn: NOT DETECTED
Cannabinoid 50 Ng, Ur ~~LOC~~: NOT DETECTED
Cocaine Metabolite,Ur ~~LOC~~: NOT DETECTED
MDMA (Ecstasy)Ur Screen: NOT DETECTED
Methadone Scn, Ur: NOT DETECTED
Opiate, Ur Screen: NOT DETECTED
Phencyclidine (PCP) Ur S: NOT DETECTED
Tricyclic, Ur Screen: NOT DETECTED

## 2022-08-08 LAB — CBC WITH DIFFERENTIAL/PLATELET
Abs Immature Granulocytes: 0.01 10*3/uL (ref 0.00–0.07)
Basophils Absolute: 0 10*3/uL (ref 0.0–0.1)
Basophils Relative: 1 %
Eosinophils Absolute: 0.3 10*3/uL (ref 0.0–0.5)
Eosinophils Relative: 7 %
HCT: 45.1 % (ref 39.0–52.0)
Hemoglobin: 14.5 g/dL (ref 13.0–17.0)
Immature Granulocytes: 0 %
Lymphocytes Relative: 17 %
Lymphs Abs: 0.7 10*3/uL (ref 0.7–4.0)
MCH: 31.6 pg (ref 26.0–34.0)
MCHC: 32.2 g/dL (ref 30.0–36.0)
MCV: 98.3 fL (ref 80.0–100.0)
Monocytes Absolute: 0.5 10*3/uL (ref 0.1–1.0)
Monocytes Relative: 11 %
Neutro Abs: 2.7 10*3/uL (ref 1.7–7.7)
Neutrophils Relative %: 64 %
Platelets: 141 10*3/uL — ABNORMAL LOW (ref 150–400)
RBC: 4.59 MIL/uL (ref 4.22–5.81)
RDW: 14.5 % (ref 11.5–15.5)
WBC: 4.2 10*3/uL (ref 4.0–10.5)
nRBC: 0 % (ref 0.0–0.2)

## 2022-08-08 LAB — DIFFERENTIAL
Abs Immature Granulocytes: 0.02 K/uL (ref 0.00–0.07)
Basophils Absolute: 0 K/uL (ref 0.0–0.1)
Basophils Relative: 1 %
Eosinophils Absolute: 0.3 K/uL (ref 0.0–0.5)
Eosinophils Relative: 8 %
Immature Granulocytes: 1 %
Lymphocytes Relative: 16 %
Lymphs Abs: 0.7 K/uL (ref 0.7–4.0)
Monocytes Absolute: 0.4 K/uL (ref 0.1–1.0)
Monocytes Relative: 10 %
Neutro Abs: 2.6 K/uL (ref 1.7–7.7)
Neutrophils Relative %: 64 %

## 2022-08-08 LAB — ETHANOL: Alcohol, Ethyl (B): <10 mg/dL (ref <10)

## 2022-08-08 LAB — COMPREHENSIVE METABOLIC PANEL WITH GFR
ALT: 34 U/L (ref 0–44)
AST: 28 U/L (ref 15–41)
Albumin: 3.6 g/dL (ref 3.5–5.0)
Alkaline Phosphatase: 102 U/L (ref 38–126)
Anion gap: 4 — ABNORMAL LOW (ref 5–15)
BUN: 12 mg/dL (ref 8–23)
CO2: 28 mmol/L (ref 22–32)
Calcium: 8.5 mg/dL — ABNORMAL LOW (ref 8.9–10.3)
Chloride: 109 mmol/L (ref 98–111)
Creatinine, Ser: 0.84 mg/dL (ref 0.61–1.24)
GFR, Estimated: >60 mL/min
Glucose, Bld: 75 mg/dL (ref 70–99)
Potassium: 3.7 mmol/L (ref 3.5–5.1)
Sodium: 141 mmol/L (ref 135–145)
Total Bilirubin: 0.9 mg/dL (ref 0.3–1.2)
Total Protein: 6.1 g/dL — ABNORMAL LOW (ref 6.5–8.1)

## 2022-08-08 LAB — URINALYSIS, ROUTINE W REFLEX MICROSCOPIC
Bilirubin Urine: NEGATIVE
Glucose, UA: NEGATIVE mg/dL
Hgb urine dipstick: NEGATIVE
Ketones, ur: NEGATIVE mg/dL
Leukocytes,Ua: NEGATIVE
Nitrite: NEGATIVE
Protein, ur: NEGATIVE mg/dL
Specific Gravity, Urine: 1.014 (ref 1.005–1.030)
pH: 7 (ref 5.0–8.0)

## 2022-08-08 LAB — APTT: aPTT: 29 s (ref 24–36)

## 2022-08-08 LAB — CBG MONITORING, ED: Glucose-Capillary: 100 mg/dL — ABNORMAL HIGH (ref 70–99)

## 2022-08-08 LAB — PROTIME-INR
INR: 1.1 (ref 0.8–1.2)
Prothrombin Time: 14.1 seconds (ref 11.4–15.2)

## 2022-08-08 LAB — MAGNESIUM: Magnesium: 2.4 mg/dL (ref 1.7–2.4)

## 2022-08-08 MED ORDER — IOHEXOL 350 MG/ML SOLN
75.0000 mL | Freq: Once | INTRAVENOUS | Status: AC | PRN
  Administered 2022-08-08: 75 mL via INTRAVENOUS

## 2022-08-08 MED ORDER — ASPIRIN 81 MG PO TBEC: 81.0000 mg | DELAYED_RELEASE_TABLET | Freq: Every day | ORAL | 1 refills | Status: AC

## 2022-08-08 NOTE — ED Triage Notes (Signed)
Pt arrived via [facility] from home with reports of numbness to L hand and L foot.  Pt states when he went to bed around 11pm, he did not have any numbness, when he woke up around 12:30am he noticed the numbness to L hand and foot. Pt went back to bed and felt the numbness again when he woke up at 3am.  Pt denies any numbness at this time. Per EMS pt was noted to be unsteady on his feet.  Pt was also noted to have CBG of 65 was given orange juice to drink.  CBG now up to 100.   Pt denies any HA, pt reports recent admission to the Geri-psych unit here.  Pt reports taking glipizide twice daily.

## 2022-08-08 NOTE — Discharge Instructions (Addendum)
Your symptoms of left-sided numbness today seem atypical for a stroke given they have been intermittent for several weeks.  Your MRI did not show any acute findings.  CT scan of your head and neck did not show any aneurysm or dissection of any of your blood vessels.  Been close follow-up with neurology as an outpatient.  I recommend that you continue Aspirin 81 mg daily.

## 2022-08-08 NOTE — ED Notes (Signed)
Pt. To triage stretcher 2

## 2022-08-08 NOTE — ED Provider Notes (Signed)
[facility] Provider Note    Event Date/Time   First MD Initiated Contact with Patient 08/08/22 [phone]     (approximate)   History   Numbness   HPI  John [name] is a 81 y.o. 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  States symptoms started again tonight and he decided to call 911 but there was no significant change in what had been going on over the past few weeks.  States he went to bed around 11 PM and was not having symptoms but woke up at 12:30 AM with symptoms.  No weakness, vision or speech changes, headache, head injury.  No chest pain or shortness of breath.  He is not on any antiplatelets or anticoagulants.   History provided by patient and EMS.    Past Medical History:  Diagnosis Date   Cancer ([place])    Depression    Depression    Diabetes mellitus without complication ([place])    type 2   Dissection of carotid artery ([place])    a. 06/2015 CT Head: abnl thickening of mid-dist R cervical ICA w/o narrowing - ? vasculitis and thrombosed/healing dissection; b. 01/2021 Carotid U/S: 1-39% bilat ICA stenoses.   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 Age 64 - struck in head w/ baseball --> s/p craniotomy.   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 Approx 2015 - eval in [place] - conservatively managed.     Past Surgical History:  Procedure Laterality Date   ANKLE ARTHROSCOPY Left    fracture   CHOLECYSTECTOMY  2007   [place]   left frontal craniotomy for epidural hematoma   HIP ARTHROPLASTY Right 04/07/2022   Procedure: ARTHROPLASTY BIPOLAR HIP (HEMIARTHROPLASTY);  Surgeon: Earnestine Leys, MD;  Location: [facility] ORS;  Service: Orthopedics;  Laterality: Right;   INSERTION OF MESH  01/02/2022   Procedure: INSERTION OF MESH;  Surgeon: Herbert Pun, MD;  Location: [facility] ORS;  Service: General;;   LIGAMENT REPAIR Right    thumb   RETINAL DETACHMENT SURGERY     TREATMENT FISTULA ANAL  2006    MEDICATIONS:  Prior to Admission medications   Medication Sig Start Date End Date Taking? Authorizing Provider  aspirin EC 81 MG tablet Take 81 mg by mouth daily.    [provider]  buPROPion (WELLBUTRIN XL) 300 MG 24 hr tablet Take 1 tablet by mouth daily. 04/27/22   [provider]  docusate sodium (COLACE) 100 MG capsule Take 1 capsule (100 mg total) by mouth 2 (two) times daily. 07/31/22   Parks Ranger, DO  modafinil (PROVIGIL) 100 MG tablet Take 1 tablet (100 mg total) by mouth daily after breakfast. 08/01/22   Parks Ranger, DO  OLANZapine [facility])  10 MG tablet Take 1 tablet (10 mg total) by mouth at bedtime. 07/31/22   Parks Ranger, DO  sertraline (ZOLOFT) 50 MG tablet Take 1 tablet (50 mg total) by mouth at bedtime. 07/31/22 11/28/22  Parks Ranger, DO  traZODone (DESYREL) 50 MG tablet Take 1 tablet (50 mg total) by mouth at bedtime. 07/31/22   Parks Ranger, DO    Physical Exam   Triage Vital Signs: ED Triage Vitals  Enc Vitals Group     BP 08/08/22 0503 130/87     Pulse Rate 08/08/22 0503 70     Resp 08/08/22 0503 16     Temp 08/08/22 0503 98.1 F (36.7 C)     Temp Source 08/08/22 0503 Oral     SpO2 08/08/22 0503 98 %     Weight 08/08/22 0500 175 lb (79.4 kg)     Height 08/08/22 0500 '6\' 2"'$  (1.88 m)      Head Circumference --      Peak Flow --      Pain Score 08/08/22 0500 0     Pain Loc --      Pain Edu? --      Excl. in [place]? --     Most recent vital signs: Vitals:   08/08/22 0503 08/08/22 0632  BP: 130/87 (!) 144/82  Pulse: 70 65  Resp: 16 20  Temp: 98.1 F (36.7 C) 97.9 F (36.6 C)  SpO2: 98% 97%    CONSTITUTIONAL: Alert and oriented and responds appropriately to questions. Well-appearing; well-nourished HEAD: Normocephalic, atraumatic EYES: Conjunctivae clear, pupils appear equal, sclera nonicteric ENT: normal nose; moist mucous membranes NECK: Supple, normal ROM CARD: RRR; S1 and S2 appreciated; no murmurs, no clicks, no rubs, no gallops RESP: Normal chest excursion without splinting or tachypnea; breath sounds clear and equal bilaterally; no wheezes, no rhonchi, no rales, no hypoxia or respiratory distress, speaking full sentences ABD/GI: Normal bowel sounds; non-distended; soft, non-tender, no rebound, no guarding, no peritoneal signs BACK: The back appears normal EXT: Normal ROM in all joints; no deformity noted, no edema; no cyanosis SKIN: Normal color for age and race; warm; no rash on exposed skin NEURO: Moves all extremities equally, normal speech, sensation to light touch intact diffusely, cranial nerves II through XII intact, strength 5/5 in all 4 extremities PSYCH: The patient's mood and manner are appropriate.   ED Results / Procedures / Treatments   LABS: (all labs ordered are listed, but only abnormal results are displayed) Labs Reviewed  CBG MONITORING, ED - Abnormal; Notable for the following components:      Result Value   Glucose-Capillary 100 (*)    All other components within normal limits  DIFFERENTIAL  CBC  URINALYSIS, ROUTINE W REFLEX MICROSCOPIC  ETHANOL  PROTIME-INR  APTT  COMPREHENSIVE METABOLIC PANEL  URINE DRUG SCREEN, QUALITATIVE ([facility] ONLY)  MAGNESIUM     EKG:    Date: 08/08/2022  Rate: 61  Rhythm: normal sinus rhythm  QRS  Axis: normal  Intervals: First-degree AV block  ST/T Wave abnormalities: normal  Conduction Disutrbances: none  Narrative Interpretation: First-degree AV block, QTc 497 ms     RADIOLOGY: My personal review and interpretation of imaging: CT scans, MRI showed no acute abnormality.  I have personally reviewed all radiology reports.   MR BRAIN WO CONTRAST  Result Date: 08/08/2022 CLINICAL DATA:  81 year old male code stroke presentation. Left extremity numbness. History of left frontal craniotomy for subdural evacuation on the left. EXAM: MRI HEAD WITHOUT  CONTRAST TECHNIQUE: Multiplanar, multiecho pulse sequences of the brain and surrounding structures were obtained without intravenous contrast. COMPARISON:  CT head, CTA head and neck today reported separately. Previous brain MRI 09/11/2015. FINDINGS: Brain: Mild left superior frontal convexity susceptibility artifact related to prior craniotomy/cranioplasty. No restricted diffusion to suggest acute infarction. No midline shift, mass effect, evidence of mass lesion, ventriculomegaly, extra-axial collection or acute intracranial hemorrhage. Cervicomedullary junction and pituitary are within normal limits. Chronic but progressed since 2016 patchy and confluent scattered bilateral cerebral white matter T2 and FLAIR hyperintensity. 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 And there are multiple chronic microhemorrhages in the bilateral thalami (series 17, image 24) which have progressed since 2016 (especially on the right). But no other chronic cerebral blood products. There is a subtle area of chronic cortical encephalomalacia in the left inferior frontal gyrus, and also the anterior middle or superior frontal gyrus on series 19, image 38, likely  postsurgical and unchanged since 2016. Comparatively mild T2 and FLAIR heterogeneity in the pons has increased since 2016. Cerebellum remains negative. Vascular: Major intracranial vascular flow voids are stable since 2016. Skull and upper cervical spine: Previous left frontal craniotomy, cranioplasty. Background bone marrow signal is within normal limits. Negative visible cervical spine. Sinuses/Orbits: Negative orbits. Trace bubbly opacity in the left sphenoid sinus. Other Visualized paranasal sinuses and mastoids are stable and well aerated. Other: Grossly negative visible internal auditory structures. IMPRESSION: 1. No acute intracranial abnormality. 2. Progressed chronic small vessel disease since a 2016 MRI, including fairly numerous chronic microhemorrhages in both thalami. 3. Prior left frontal craniotomy/cranioplasty with subtle chronic encephalomalacia of underlying frontal gyri. Electronically Signed   By: Genevie Ann M.D.   On: 08/08/2022 06:06   CT ANGIO HEAD NECK W WO CM  Result Date: 08/08/2022 CLINICAL DATA:  81 year old male code stroke presentation with left extremity numbness. History of craniotomy for subdural evacuation on the left. EXAM: CT ANGIOGRAPHY HEAD AND NECK TECHNIQUE: Multidetector CT imaging of the head and neck was performed using the standard protocol during bolus administration of intravenous contrast. Multiplanar CT image reconstructions and MIPs were obtained to evaluate the vascular anatomy. Carotid stenosis measurements (when applicable) are obtained utilizing NASCET criteria, using the distal internal carotid diameter as the denominator. RADIATION DOSE REDUCTION: This exam was performed according to the departmental dose-optimization program which includes automated exposure control, adjustment of the mA and/or kV according to patient size and/or use of iterative reconstruction technique. CONTRAST:  11m OMNIPAQUE IOHEXOL 350 MG/ML SOLN COMPARISON:  Plain head CT 0525 hours.   CTA head and neck 07/20/2015 FINDINGS: CTA NECK Skeleton: Cervical spine degeneration is mild for age and fairly stable since 2016. Previous left frontal craniotomy, cranioplasty. No acute osseous abnormality identified. Upper chest: Within normal limits. Subcentimeter superior mediastinal lymph nodes up to 8 or 9 mm short axis are at the upper limits of normal. Other neck: Negative. Aortic arch: Mildly tortuous aortic arch. Calcified aortic atherosclerosis. 3 vessel arch configuration. Right carotid system: Mildly tortuous brachiocephalic artery and proximal right CCA with mild plaque and no stenosis. Mild plaque at the right ICA origin and bulb without stenosis. Circumferential soft tissue thickening of the distal cervical right ICA seen in 2016 has resolved. Left carotid system: Mild tortuosity and minimal atherosclerosis. No stenosis. Vertebral arteries: Tortuous proximal right subclavian artery with mild plaque  and no stenosis. Normal right vertebral artery origin. Non dominant right vertebral artery as demonstrated in 2016. Tortuous right V 2 segment but no atherosclerosis or stenosis to the skull base. Minimal proximal left subclavian artery plaque without stenosis. Normal left vertebral artery origin. Dominant left vertebral artery appears patent and normal to the skull base. CTA HEAD Posterior circulation: Dominant left V4 segment with mild calcified plaque and no significant stenosis. Non dominant right V4 terminates in PICA. Tortuous left vertebrobasilar junction and basilar artery without stenosis. Patent basilar tip, SCA and PCA origins. Small posterior communicating arteries are present. Bilateral PCA branches appear stable and within normal limits. Anterior circulation: Both ICA siphons are patent. Mild mostly supraclinoid ICA calcified plaque without stenosis. Ophthalmic and posterior communicating artery origins appear normal. Patent carotid termini. MCA and ACA origins are stable and within normal  limits. Bilateral ACA branches are stable and within normal limits. Left MCA M1 segment is chronically tortuous. Left MCA bifurcation and left MCA branches appear stable, patent without stenosis. Right MCA M1 segment is chronically tortuous. Right MCA bifurcation is patent without stenosis. Right MCA branches appear stable and within normal limits. Venous sinuses: Patent. Dominant right side transverse and sigmoid venous sinus as in 2016. Anatomic variants: Dominant left vertebral artery supplies the basilar, the right terminates in PICA. Review of the MIP images confirms the above findings IMPRESSION: 1. Negative for large vessel occlusion. Mild for age atherosclerosis in the head and neck with no arterial stenosis. 2. Abnormal soft tissue thickening of the distal cervical Right ICA seen on 2016 CTA has resolved. Electronically Signed   By: Genevie Ann M.D.   On: 08/08/2022 05:53   CT HEAD CODE STROKE WO CONTRAST  Addendum Date: 08/08/2022   ADDENDUM REPORT: 08/08/2022 05:38 ADDENDUM: Study discussed by telephone with Dr. Cyril Mourning Cuma Polyakov on 08/08/2022 at 0532 hours. Electronically Signed   By: Genevie Ann M.D.   On: 08/08/2022 05:38   Result Date: 08/08/2022 CLINICAL DATA:  Code stroke. 81 year old male with left extremity numbness. EXAM: CT HEAD WITHOUT CONTRAST TECHNIQUE: Contiguous axial images were obtained from the base of the skull through the vertex without intravenous contrast. RADIATION DOSE REDUCTION: This exam was performed according to the departmental dose-optimization program which includes automated exposure control, adjustment of the mA and/or kV according to patient size and/or use of iterative reconstruction technique. COMPARISON:  Brain MRI 09/11/2015.  Head CT 04/06/2022. FINDINGS: Brain: Stable cerebral volume. No midline shift, ventriculomegaly, mass effect, evidence of mass lesion, intracranial hemorrhage or evidence of cortically based acute infarction. Patchy and confluent bilateral cerebral  white matter hypodensity including asymmetric involvement of the anterior right internal capsule appears stable by CT since June. These changes have progressed since the 2016 MRI. Vascular: Calcified atherosclerosis at the skull base. No suspicious intracranial vascular hyperdensity. Skull: Previous left frontal craniotomy, cranioplasty appears stable. No acute osseous abnormality identified. Sinuses/Orbits: Bubbly opacity in the sphenoid sinuses has decreased since June but not resolved. Other Visualized paranasal sinuses and mastoids are stable and well aerated. Other: Chronic postoperative changes to the left scalp. No acute orbit or scalp soft tissue finding. ASPECTS [facility] Stroke Program Early CT Score) Total score (0-10 with 10 being normal): 10 IMPRESSION: 1. No acute cortically based infarct or acute intracranial hemorrhage identified. ASPECTS 10. 2. Stable CT appearance of chronic cerebral white matter disease, likely small vessel related. 3. Chronic postoperative changes to the left frontal bone. Electronically Signed: By: Genevie Ann M.D. On: 08/08/2022 05:29  PROCEDURES:  Critical Care performed: No     Procedures    IMPRESSION / MDM / ASSESSMENT AND PLAN / ED COURSE  I reviewed the triage vital signs and the nursing notes.    Patient here with intermittent left hand and foot numbness.  Appears to be neurologically intact on my exam.  Symptoms ongoing for several weeks.  The patient is on the cardiac monitor to evaluate for evidence of arrhythmia and/or significant heart rate changes.   DIFFERENTIAL DIAGNOSIS (includes but not limited to):   TIA, CVA, complex migraine, less likely seizure, neuropathy   Patient's presentation is most consistent with acute presentation with potential threat to life or bodily function.   PLAN: Patient's NIH stroke scale 0.  LVO negative.  Will not activate code stroke at this time as he is not a tPA candidate.  Will obtain CT of the head, CTA  head and neck, MRI brain, CBC, CMP, ethanol level, urinalysis, urine drug screen, EKG.  Initially blood glucose with EMS was 65 but on recheck here it is 100.   MEDICATIONS GIVEN IN ED: Medications  iohexol (OMNIPAQUE) 350 MG/ML injection 75 mL (75 mLs Intravenous Contrast Given 08/08/22 0536)     ED COURSE: CT scans, MRI reviewed and interpreted by myself and the radiologist and show no acute findings.  He does have multiple chronic microvascular hemorrhages noted in his thalamus bilaterally.  Labs, EKG pending.  Patient has been requesting something to eat and drink since arrival.  Discussed with him if imaging was reassuring that he could drink if he passed a swallow screen.   7:19 AM  Pt's labs, urine pending.  Signed out the oncoming ED physician to reassess patient once this work-up is back.  CONSULTS: Dispo pending further work-up.   OUTSIDE RECORDS REVIEWED:  Reviewed patient's last office visit with internal medicine on 07/03/2022.         FINAL CLINICAL IMPRESSION(S) / ED DIAGNOSES   Final diagnoses:  Left sided numbness     Rx / DC Orders   ED Discharge Orders          Ordered    aspirin EC 81 MG tablet  Daily        08/08/22 0635             Note:  This document was prepared using Dragon voice recognition software and may include unintentional dictation errors.   Veleka Djordjevic, Delice Bison, DO 08/08/22 [phone]

## 2022-08-08 NOTE — ED Notes (Signed)
Patient discharged to home per MD order. Patient in stable condition, and deemed medically cleared by ED provider for discharge. Discharge instructions reviewed with patient/family using "Teach Back"; verbalized understanding of medication education and administration, and information about follow-up care. Denies further concerns. ° °

## 2022-08-08 NOTE — ED Provider Notes (Signed)
9:36 AM reports tingling has gone away.  Cbc shows slightly low plts  Mag normal  Cmp normal  UA negative  Ekg reviewed personally and interpretted:  Sinus rate of 61, no st elevation, no twi normal intervals      Vanessa Wilton, MD 08/08/22 1111

## 2022-08-10 NOTE — Telephone Encounter (Signed)
error 

## 2022-08-15 ENCOUNTER — Other Ambulatory Visit: Payer: Self-pay | Admitting: Psychiatry

## 2022-08-16 ENCOUNTER — Encounter: Payer: Self-pay | Admitting: Certified Registered Nurse Anesthetist

## 2022-08-16 ENCOUNTER — Ambulatory Visit
Admit: 2022-08-16 | Discharge: 2022-08-16 | Disposition: A | Discharge status: Home / Self Care | Payer: Medicare Other | Attending: Psychiatry | Admitting: Psychiatry

## 2022-08-16 DIAGNOSIS — F339 Major depressive disorder, recurrent, unspecified: Secondary | ICD-10-CM | POA: Insufficient documentation

## 2022-08-16 DIAGNOSIS — I10 Essential (primary) hypertension: Secondary | ICD-10-CM | POA: Diagnosis not present

## 2022-08-16 DIAGNOSIS — Z8546 Personal history of malignant neoplasm of prostate: Secondary | ICD-10-CM | POA: Diagnosis not present

## 2022-08-16 DIAGNOSIS — E119 Type 2 diabetes mellitus without complications: Secondary | ICD-10-CM | POA: Diagnosis not present

## 2022-08-16 DIAGNOSIS — G4730 Sleep apnea, unspecified: Secondary | ICD-10-CM | POA: Insufficient documentation

## 2022-08-16 DIAGNOSIS — Z79899 Other long term (current) drug therapy: Secondary | ICD-10-CM | POA: Insufficient documentation

## 2022-08-16 DIAGNOSIS — Z7984 Long term (current) use of oral hypoglycemic drugs: Secondary | ICD-10-CM | POA: Diagnosis not present

## 2022-08-16 DIAGNOSIS — M92529 Juvenile osteochondrosis of tibia tubercle, unspecified leg: Secondary | ICD-10-CM | POA: Diagnosis not present

## 2022-08-16 DIAGNOSIS — F332 Major depressive disorder, recurrent severe without psychotic features: Secondary | ICD-10-CM | POA: Diagnosis not present

## 2022-08-16 LAB — GLUCOSE, CAPILLARY
Glucose-Capillary: 107 mg/dL — ABNORMAL HIGH (ref 70–99)
Glucose-Capillary: 87 mg/dL (ref 70–99)

## 2022-08-16 MED ORDER — SODIUM CHLORIDE 0.9 % IV SOLN
500.0000 mL | Freq: Once | INTRAVENOUS | Status: AC
  Administered 2022-08-16: 500 mL via INTRAVENOUS

## 2022-08-16 MED ORDER — GLYCOPYRROLATE 0.2 MG/ML IJ SOLN: 0.1000 mg | Freq: Once | INTRAMUSCULAR | Status: AC

## 2022-08-16 MED ORDER — SUCCINYLCHOLINE CHLORIDE 200 MG/10ML IV SOSY
PREFILLED_SYRINGE | INTRAVENOUS | Status: DC | PRN
  Administered 2022-08-16: 100 mg via INTRAVENOUS

## 2022-08-16 MED ORDER — KETAMINE HCL 50 MG/5ML IJ SOSY
PREFILLED_SYRINGE | INTRAMUSCULAR | Status: AC
  Filled 2022-08-16: qty 5

## 2022-08-16 MED ORDER — MIDAZOLAM HCL 2 MG/2ML IJ SOLN
INTRAMUSCULAR | Status: DC | PRN
  Administered 2022-08-16: 2 mg via INTRAVENOUS

## 2022-08-16 MED ORDER — MIDAZOLAM HCL 2 MG/2ML IJ SOLN
INTRAMUSCULAR | Status: AC
  Filled 2022-08-16: qty 2

## 2022-08-16 MED ORDER — GLYCOPYRROLATE 0.2 MG/ML IJ SOLN
INTRAMUSCULAR | Status: AC
  Administered 2022-08-16: 0.1 mg via INTRAVENOUS
  Filled 2022-08-16: qty 1

## 2022-08-16 MED ORDER — KETAMINE HCL 10 MG/ML IJ SOLN
INTRAMUSCULAR | Status: DC | PRN
  Administered 2022-08-16: 100 mg via INTRAVENOUS

## 2022-08-16 MED ORDER — MIDAZOLAM HCL 2 MG/2ML IJ SOLN: 2.0000 mg | Freq: Once | INTRAMUSCULAR | Status: DC

## 2022-08-16 MED ORDER — DEXTROSE-NACL 5-0.45 % IV SOLN: INTRAVENOUS | Status: DC

## 2022-08-16 MED ORDER — DEXTROSE 5 % IV SOLN: 500.0000 mL | Freq: Once | INTRAVENOUS | Status: DC

## 2022-08-16 NOTE — Procedures (Signed)
ECT SERVICES Physician's Interval Evaluation & Treatment Note   Patient Identification: John [name] MRN:  [id number] Date of Evaluation:  08/16/2022 TX #: [id number]  MADRS:   MMSE:   P.E. Findings:  No change to physical exam  Psychiatric Interval Note:  Mood is stable  Subjective:  Patient is a 81 y.o. male seen for evaluation for Electroconvulsive Therapy. Stable not necessarily worse no suicidal thoughts  Treatment Summary:   '[x]'$   Right Unilateral             '[]'$  Bilateral   % Energy : 0.3 ms 100%   Impedance: 1620 ohms  Seizure Energy Index: 4320 V squared  Postictal Suppression Index: No reading  Seizure Concordance Index: 52%  Medications  Pre Shock: Robinul 0.1 mg labetalol 10 mg ketamine 100 mg succinylcholine 100 mg  Post Shock: Versed 2 mg  Seizure Duration: 32 seconds EMG 63 seconds EEG   Comments: Follow-up in about 3 weeks  Lungs:  '[x]'$   Clear to auscultation               '[]'$  Other:   Heart:    '[x]'$   Regular rhythm             '[]'$  irregular rhythm    '[x]'$   Previous H&P reviewed, patient examined and there are NO CHANGES                 '[]'$   Previous H&P reviewed, patient examined and there are changes noted.   [name], MD 10/20/20235:01 PM

## 2022-08-16 NOTE — Transfer of Care (Signed)
Immediate Anesthesia Transfer of Care Note  Patient: John [name]  Procedure(s) Performed: ECT TX  Patient Location: PACU  Anesthesia Type:General  Level of Consciousness: drowsy  Airway & Oxygen Therapy: Patient Spontanous Breathing and Patient connected to face mask oxygen  Post-op Assessment: Report given to RN and Post -op Vital signs reviewed and stable  Post vital signs: Reviewed and stable  Last Vitals:  Vitals Value Taken Time  BP 141/90 08/16/22 1209  Temp    Pulse 94 08/16/22 1211  Resp 16 08/16/22 1211  SpO2 100 % 08/16/22 1211  Vitals shown include unvalidated device data.  Last Pain:  Vitals:   08/16/22 1016  TempSrc: Oral  PainSc: 0-No pain         Complications: No notable events documented.

## 2022-08-16 NOTE — H&P (Signed)
John [name] is an 81 y.o. male.   Chief Complaint: Patient has no specific new complaint.  Feeling stable.  Denies any suicidal thought denies feeling explicitly depressed. 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 06/2015 CT Head: abnl thickening of mid-dist R cervical ICA w/o narrowing - ? vasculitis and thrombosed/healing dissection; b. 01/2021 Carotid U/S: 1-39% bilat ICA stenoses.   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 Age 88 - struck in head w/ baseball --> s/p craniotomy.   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)    a. Approx 2015 - eval in [place] - conservatively managed.    Past Surgical History:  Procedure Laterality Date   ANKLE ARTHROSCOPY Left    fracture   CHOLECYSTECTOMY  2007   [place]   left frontal craniotomy for epidural hematoma   HIP ARTHROPLASTY Right 04/07/2022   Procedure: ARTHROPLASTY BIPOLAR HIP (HEMIARTHROPLASTY);  Surgeon: Earnestine Leys, MD;  Location: [facility] ORS;  Service: Orthopedics;  Laterality: Right;   INSERTION OF MESH  01/02/2022   Procedure: INSERTION OF MESH;  Surgeon: Herbert Pun, MD;  Location: [facility] ORS;  Service: General;;   LIGAMENT REPAIR Right    thumb   RETINAL DETACHMENT SURGERY     TREATMENT FISTULA ANAL  2006    Family History  Problem Relation Age of Onset   Osteoporosis Mother    Depression Mother    Prostate cancer Father     Hypertension Father    Diabetes Brother    Kidney disease Neg Hx    Social History:  reports that he has never smoked. He has never used smokeless tobacco. He reports that he does not currently use alcohol. He reports that he does not use drugs.  Allergies: No Known Allergies  (Not in a hospital admission)   Results for orders placed or performed during the hospital encounter of 08/16/22 (from the past 48 hour(s))  Glucose, capillary     Status: None   Collection Time: 08/16/22 11:48 AM  Result Value Ref Range   Glucose-Capillary 87 70 - 99 mg/dL    Comment: Glucose reference range applies only to samples taken after fasting for at least 8 hours.  Glucose, capillary     Status: Abnormal   Collection Time: 08/16/22 12:16 PM  Result Value Ref Range   Glucose-Capillary 107 (H) 70 - 99 mg/dL    Comment: Glucose reference range applies only to samples taken after fasting for at least 8 hours.   No results found.  Review of Systems  Constitutional: Negative.   HENT: Negative.    Eyes: Negative.   Respiratory: Negative.    Cardiovascular: Negative.   Gastrointestinal: Negative.   Musculoskeletal: Negative.   Skin: Negative.   Neurological: Negative.  Psychiatric/Behavioral: Negative.      Blood pressure (!) 158/97, pulse 75, temperature 97.9 F (36.6 C), temperature source Oral, resp. rate 15, height '6\' 2"'$  (1.88 m), weight 79.4 kg, SpO2 97 %. Physical Exam Vitals reviewed.  Constitutional:      Appearance: He is well-developed.  HENT:     Head: Normocephalic and atraumatic.  Eyes:     Conjunctiva/sclera: Conjunctivae normal.     Pupils: Pupils are equal, round, and reactive to light.  Cardiovascular:     Heart sounds: Normal heart sounds.  Pulmonary:     Effort: Pulmonary effort is normal.  Abdominal:     Palpations: Abdomen is soft.  Musculoskeletal:        General: Normal range of motion.     Cervical back: Normal range of motion.  Skin:    General: Skin is warm  and dry.  Neurological:     General: No focal deficit present.     Mental Status: He is alert.  Psychiatric:        Mood and Affect: Mood normal.        Behavior: Behavior normal.        Thought Content: Thought content normal.      Assessment/Plan Discussed options for further treatment and agreed to have him return in about 3 weeks to continue maintenance series.  [name], MD 08/16/2022, 5:00 PM

## 2022-08-16 NOTE — Anesthesia Procedure Notes (Signed)
Procedure Name: General with mask airway Date/Time: 08/16/2022 11:54 AM  Performed by: Lily Peer, Chavela Justiniano, CRNAPre-anesthesia Checklist: Patient identified, Emergency Drugs available, Suction available, Patient being monitored and Timeout performed Patient Re-evaluated:Patient Re-evaluated prior to induction Oxygen Delivery Method: Circle system utilized Preoxygenation: Pre-oxygenation with 100% oxygen Induction Type: IV induction Ventilation: Mask ventilation throughout procedure

## 2022-08-16 NOTE — Anesthesia Preprocedure Evaluation (Signed)
Anesthesia Evaluation  Patient identified by MRN, date of birth, ID band Patient awake and Patient confused    Reviewed: Allergy & Precautions, NPO status , Patient's Chart, lab work & pertinent test results  History of Anesthesia Complications (+) PONV and history of anesthetic complications  Airway Mallampati: II  TM Distance: >3 FB Neck ROM: Full    Dental  (+) Caps,    Pulmonary neg pulmonary ROS, sleep apnea ,    Pulmonary exam normal breath sounds clear to auscultation       Cardiovascular hypertension, Pt. on medications negative cardio ROS Normal cardiovascular exam Rhythm:Regular     Neuro/Psych  Headaches, Depression negative neurological ROS  negative psych ROS   GI/Hepatic negative GI ROS, Neg liver ROS,   Endo/Other  negative endocrine ROSdiabetes, Type 2, Oral Hypoglycemic Agents  Renal/GU negative Renal ROS  negative genitourinary   Musculoskeletal   Abdominal Normal abdominal exam  (+)   Peds negative pediatric ROS (+)  Hematology negative hematology ROS (+)   Anesthesia Other Findings Past Medical History: No date: Cancer ([place]) No date: Depression No date: Depression No date: Diabetes mellitus without complication (HCC)     Comment:  type 2 No date: Dissection of carotid artery (HCC)     Comment:  a. 06/2015 CT Head: abnl thickening of mid-dist R               cervical ICA w/o narrowing - ? vasculitis and               thrombosed/healing dissection; b. 01/2021 Carotid U/S:               1-39% bilat ICA stenoses. No date: Eczema No date: Elevated PSA No date: Epidural hematoma (HCC)     Comment:  a. Age 19 - struck in head w/ baseball --> s/p               craniotomy. 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 Approx 2015 - eval in [place] - conservatively managed.  Past Surgical History: No date: ANKLE ARTHROSCOPY; Left     Comment:  fracture 2007: CHOLECYSTECTOMY 1958: CRANIOTOMY     Comment:  left frontal craniotomy for epidural hematoma 04/07/2022: HIP ARTHROPLASTY; Right     Comment:  Procedure: ARTHROPLASTY BIPOLAR HIP (HEMIARTHROPLASTY);               Surgeon: Earnestine Leys, MD;  Location: [facility] ORS;                Service: Orthopedics;  Laterality: Right; 01/02/2022: INSERTION OF MESH     Comment:  Procedure: INSERTION OF MESH;  Surgeon: Herbert Pun, MD;  Location: [facility] ORS;  Service: General;; No date: LIGAMENT REPAIR; Right     Comment:  thumb No date: RETINAL DETACHMENT SURGERY 2006: TREATMENT FISTULA ANAL  BMI    Body Mass Index: 20.03 kg/m      Reproductive/Obstetrics negative OB ROS  Anesthesia Physical  Anesthesia Plan  ASA: 3  Anesthesia Plan: General   Post-op Pain Management:    Induction: Intravenous  PONV Risk Score and Plan: 3 and Propofol infusion and TIVA  Airway Management Planned: Natural Airway and Mask  Additional Equipment:   Intra-op Plan:   Post-operative Plan:   Informed Consent: I have reviewed the patients History and Physical, chart, labs and discussed the procedure including the risks, benefits and alternatives for the proposed anesthesia with the patient or authorized representative who has indicated his/her understanding and acceptance.     Dental Advisory Given  Plan Discussed with: CRNA and Surgeon  Anesthesia Plan Comments: (Patient consented for risks of anesthesia including but not limited to:  - adverse reactions to  medications - risk of airway placement if required - damage to eyes, teeth, lips or other oral mucosa - nerve damage due to positioning  - sore throat or hoarseness - Damage to heart, brain, nerves, lungs, other parts of body or loss of life  Patient voiced understanding.)        Anesthesia Quick Evaluation

## 2022-08-19 NOTE — Anesthesia Postprocedure Evaluation (Signed)
Anesthesia Post Note  Patient: John [name]  Procedure(s) Performed: ECT TX  Patient location during evaluation: Specials Recovery Anesthesia Type: General Level of consciousness: awake and alert Pain management: pain level controlled Vital Signs Assessment: post-procedure vital signs reviewed and stable Respiratory status: spontaneous breathing, nonlabored ventilation, respiratory function stable and patient connected to nasal cannula oxygen Cardiovascular status: blood pressure returned to baseline and stable Postop Assessment: no apparent nausea or vomiting Anesthetic complications: no   No notable events documented.   Last Vitals:  Vitals:   08/16/22 1245 08/16/22 1336  BP: (!) 165/103 (!) 158/97  Pulse: 77 75  Resp: 13 15  Temp:  36.6 C  SpO2: 97%     Last Pain:  Vitals:   08/16/22 1336  TempSrc: Oral  PainSc: 0-No pain                 Dimas Millin

## 2022-08-21 NOTE — Progress Notes (Signed)
BH MD/PA/NP OP Progress Note  08/26/2022 12:43 PM John [name]  MRN:  [id number]  Chief Complaint:  Chief Complaint  Patient presents with   Follow-up   HPI:  - He was admitted since the last visit to [facility] for depression. He underwent ECT. His discharge medication includes sertraline 50, modafinil 100 mg, olanzapine 10 mg, bupropion 300 mg.   This is a follow-up appointment for depression.  The majority of the history was obtained with the help of his wife.  He started with saying that he has ulcer in his foot, and there was blood in the morning.  He was advised to go to walk-in clinic to address this. He states that he finds it helpful when he was asked about the recent admission, although he was unable to elaborate the reason.  He tends to spend time inside the house/in his office since discharge.  He continues to have difficulty with concentration.  When he was asked about his brother, he reports good help from him. The patient has mood symptoms as in PHQ-9/GAD-7. He denies SI, HI, hallucinations.  He has not noticed any difference since being off modafinil (according to him, he was told by the provider in the hospital that the medication costs too much). Although he initially reports ambivalence of uptitration of sertraline, he had is to try this time. Of note, he reports he has incontinence due to urinary urgency.  He was able to tell the past story of him having biopsy of prostate, which showed malignancy.   John [name] his wife presents to the interview.  John [name] thinks ECT is helping.  She thinks he is more talkative (engage well) since discharge.  He has not been able to get modafinil due to its cost. PT, nurse comes to the house every week. He takes a walk around the house, use a walker.  He fell a few times, although he denies dizziness. (He was advised to address this to his provider and PT).  His brother came to visit him, and helped to pay bills. She denies safety concern at home.   SLUMS  15/30 06/2022 per chart review  Clock drawing- 3/3, oriented  Functional Status Instrumental Activities of Daily Living (IADLs):  Toron Bowring is independent in the following:  Requires assistance with the following: managing finances, medications, driving  Activities of Daily Living (ADLs):  Monte Zinni is independent in the following: bathing and hygiene, feeding, continence, grooming walking  Dependent (incontinence)  Wt Readings from Last 3 Encounters:  08/26/22 178 lb 6.4 oz (80.9 kg)  08/16/22 175 lb (79.4 kg)  08/08/22 175 lb (79.4 kg)      Visit Diagnosis:    ICD-10-CM   1. MDD (major depressive disorder), recurrent episode, moderate (HCC)  F33.1     2. Cognitive impairment  R41.89       Past Psychiatric History: Please see initial evaluation for full details. I have reviewed the history. No updates at this time.     Past Medical History:  Past Medical History:  Diagnosis Date   Cancer ([place])    Depression    Depression    Diabetes mellitus without complication ([place])    type 2   Dissection of carotid artery ([place])    a. 06/2015 CT Head: abnl thickening of mid-dist R cervical ICA w/o narrowing - ? vasculitis and thrombosed/healing dissection; b. 01/2021 Carotid U/S: 1-39% bilat ICA stenoses.   Eczema    Elevated PSA    Epidural hematoma (HCC)  a. Age 52 - struck in head w/ baseball --> s/p craniotomy.   Epidural hematoma (HCC)    H/O Osgood-Schlatter disease    Hand deformity, congenital    Hearing loss bilateral   Hemorrhoids    Horner syndrome    Hypertension    Migraine    Obesity    PONV (postoperative nausea and vomiting)    Prostate cancer ([place]) 2020   Rosacea    SDH (subdural hematoma) (HCC)    Sleep apnea    Spontaneous Subdural hematoma ([place])    a. Approx 2015 - eval in [place] - conservatively managed.    Past Surgical History:  Procedure Laterality Date   ANKLE ARTHROSCOPY Left    fracture   CHOLECYSTECTOMY  2007   [place]   left  frontal craniotomy for epidural hematoma   HIP ARTHROPLASTY Right 04/07/2022   Procedure: ARTHROPLASTY BIPOLAR HIP (HEMIARTHROPLASTY);  Surgeon: Earnestine Leys, MD;  Location: [facility] ORS;  Service: Orthopedics;  Laterality: Right;   INSERTION OF MESH  01/02/2022   Procedure: INSERTION OF MESH;  Surgeon: Herbert Pun, MD;  Location: [facility] ORS;  Service: General;;   LIGAMENT REPAIR Right    thumb   RETINAL DETACHMENT SURGERY     TREATMENT FISTULA ANAL  2006    Family Psychiatric History: Please see initial evaluation for full details. I have reviewed the history. No updates at this time.     Family History:  Family History  Problem Relation Age of Onset   Osteoporosis Mother    Depression Mother    Prostate cancer Father    Hypertension Father    Diabetes Brother    Kidney disease Neg Hx     Social History:  Social History   Socioeconomic History   Marital status: Married    Spouse name: [name]   Number of children: 2   Years of education: Not on file   Highest education level: Master's degree (e.g., MA, MS, MEng, MEd, MSW, MBA)  Occupational History   Not on file  Tobacco Use   Smoking status: Never   Smokeless tobacco: Never  Vaping Use   Vaping Use: Never used  Substance and Sexual Activity   Alcohol use: Not Currently   Drug use: No   Sexual activity: Yes    Birth control/protection: None  Other Topics Concern   Not on file  Social History Narrative   Live at home with wife.    Social Determinants of Health   Financial Resource Strain: Not on file  Food Insecurity: Food Insecurity Present (07/09/2022)   Hunger Vital Sign    Worried About Running Out of Food in the Last Year: Sometimes true    Ran Out of Food in the Last Year: Sometimes true  Transportation Needs: No Transportation Needs (07/09/2022)   PRAPARE - Hydrologist (Medical): No    Lack of Transportation (Non-Medical): No  Physical Activity: Not on file  Stress: Not  on file  Social Connections: Not on file    Allergies: No Known Allergies  Metabolic Disorder Labs: Lab Results  Component Value Date   HGBA1C 5.6 03/19/2022   MPG 114.02 03/19/2022   No results found for: "PROLACTIN" Lab Results  Component Value Date   CHOL 131 03/19/2022   TRIG 92 03/19/2022   HDL 33 (L) 03/19/2022   CHOLHDL 4.0 03/19/2022   VLDL 18 03/19/2022   LDLCALC 80 03/19/2022   Lab Results  Component Value Date  TSH 0.766 04/01/2022    Therapeutic Level Labs: No results found for: "LITHIUM" No results found for: "VALPROATE" No results found for: "CBMZ"  Current Medications: Current Outpatient Medications  Medication Sig Dispense Refill   aspirin EC 81 MG tablet Take 81 mg by mouth daily.     aspirin EC 81 MG tablet Take 1 tablet (81 mg total) by mouth daily. Swallow whole. 30 tablet 1   buPROPion (WELLBUTRIN XL) 300 MG 24 hr tablet Take 1 tablet by mouth daily.     docusate sodium (COLACE) 100 MG capsule Take 1 capsule (100 mg total) by mouth 2 (two) times daily. 60 capsule 3   modafinil (PROVIGIL) 100 MG tablet Take 1 tablet (100 mg total) by mouth daily after breakfast. (Patient not taking: Reported on 08/26/2022) 30 tablet 3   OLANZapine (ZYPREXA) 10 MG tablet Take 1 tablet (10 mg total) by mouth at bedtime. 30 tablet 3   sertraline (ZOLOFT) 50 MG tablet Take 1 tablet (50 mg total) by mouth at bedtime. 120 tablet 0   traZODone (DESYREL) 50 MG tablet Take 1 tablet (50 mg total) by mouth at bedtime. 30 tablet 3   No current facility-administered medications for this visit.     Musculoskeletal: Strength & Muscle Tone: decreased Gait & Station: normal (using a cane) Patient leans: N/A  Psychiatric Specialty Exam: Review of Systems  Psychiatric/Behavioral:  Positive for decreased concentration and dysphoric mood. Negative for agitation, behavioral problems, confusion, hallucinations, self-injury, sleep disturbance and suicidal ideas. The patient is  nervous/anxious. The patient is not hyperactive.   All other systems reviewed and are negative.   Blood pressure (!) 145/84, pulse 79, temperature 98.3 F (36.8 C), temperature source Oral, height '6\' 2"'$  (1.88 m), weight 178 lb 6.4 oz (80.9 kg).Body mass index is 22.91 kg/m.  General Appearance: Fairly Groomed  Eye Contact:  Good  Speech:  Clear and Coherent  Volume:  Normal  Mood:   better  Affect:   slightly restricted, calm  Thought Process:  Coherent  Orientation:  Full (Time, Place, and Person)  Thought Content: Logical   Suicidal Thoughts:  No  Homicidal Thoughts:  No  Memory:  Immediate;   Good  Judgement:  Good  Insight:  Present  Psychomotor Activity:  Normal  Concentration:  Concentration: Good and Attention Span: Good  Recall:  Good  Fund of Knowledge: Good  Language: Good  Akathisia:  No  Handed:  Right  AIMS (if indicated): not done  Assets:  Communication Skills Desire for Improvement  ADL's:  Intact  Cognition: WNL  Sleep:  Good   Screenings: AIMS    Flowsheet Row Admission (Discharged) from 04/11/2022 in [place] Total Score 6      AUDIT    Flowsheet Row Admission (Discharged) from 07/04/2022 in [place] Admission (Discharged) from 04/04/2022 in [place] Admission (Discharged) from 03/13/2022 in [place]  Alcohol Use Disorder Identification Test Final Score (AUDIT) 1 0 0      ECT-MADRS    Flowsheet Row Admission (Discharged) from 03/13/2022 in [place] Total Score [place] Office Visit from 08/26/2022 in [place] Office Visit from 06/11/2022 in [place] Office Visit from 05/20/2022 in [place]  Total GAD-7 Score '12 10 7      '$ Mini-Mental    Flowsheet Row ECT Treatment  from 08/16/2022 in  [place] Admission (Discharged) from 03/13/2022 in [place]  Total Score (max 30 points ) 30 22      PHQ2-9    [place] Visit from 08/26/2022 in [place] Office Visit from 06/11/2022 in [place] Office Visit from 05/20/2022 in [place] [facility] from 12/21/2021 in [place] [facility] from 04/22/2017 in [place]  PHQ-2 Total Score '2 4 2 '$ 0 0  PHQ-9 Total Score '7 13 11 '$ -- --      [place] Visit from 08/26/2022 in [facility] ECT Treatment from 08/16/2022 in [facility] ED from 08/08/2022 in [place] Error: Q3, 4, or 5 should not be populated when Q2 is No Error: Q3, 4, or 5 should not be populated when Q2 is No No Risk        Assessment and Plan:  Khaden Gater is a 81 y.o. year old male with a history of depression, diabetes, hypertension, PAfib, sleep apnea, subdural hematoma, seizure, hip fracture,  right femur after mechanical fall s/p right hip arthroplasty on 6/11, who presents for follow up appointment for below.   1. MDD (major depressive disorder), recurrent episode, moderate ([place]) Exam is notable for less blunted affect, and there has been improvement in speech latency, rumination since being discharged from the hospital with medication adjustment/ECT. Psychosocial stressors includes newly found A fib, hip fracture, and financial strain.  He has not been able to fill out modafinil since discharge due to its cost.  Will uptitrate sertraline to optimize treatment for depression, anxiety.  Discussed potential risk of drowsiness, GI side effect.  Will continue bupropion to target depression. Noted that he has a history of seizure, and will not  do further uptitration at this time.  Will continue olanzapine to target depression/rumination.  Discussed potential metabolic side effect, EPS, drowsiness. He will continue ECT treatment.    # cognitive impairment He has cognitive impairment as evidenced by SLUMS 15/30 when he was in the hospital.  Head CT without remarkable findings except cerebral atrophy and microvascular changes.  Etiology is multifactorial given his ongoing mood symptoms and  ECT treatment.  He does have PT coming to his home, and he was assessed by OT during/after the admission.  Will continue to assess this.   # Incontinence He was advised to contact his urologist and/or his PCP to assess this.    Plan (he will contact the office if he needs a refill) Increase sertraline 100 mg at night  Continue bupropion 300 mg daily  Continue olanzapine 10 mg at night  Continue Trazodone 50 mg at night as needed for insomnia (Hold modafinil. He has not been able to fill this since discharge) Next appointment: 12/21 at 1 PM, in person    The patient demonstrates the following risk factors for suicide: Chronic risk factors for suicide include: psychiatric disorder of depression and previous suicide attempts of putting a bag over his head many years ago, but then he took it off. . Acute risk factors for suicide include: unemployment and loss (financial, interpersonal, professional). Protective factors for this patient include: positive social support and hope for the future. Considering these factors, the overall suicide risk at this point appears to be low. Patient is appropriate for outpatient follow up.      The duration  of this appointment visit was 50 minutes of face-to-face time with the patient.  Greater than 50% of this time was spent in counseling, explanation of  diagnosis, planning of further management, and coordination of care.    Collaboration of Care: Collaboration of Care: Other reviewed notes in Epic  Patient/Guardian  was advised Release of Information must be obtained prior to any record release in order to collaborate their care with an outside provider. Patient/Guardian was advised if they have not already done so to contact the registration department to sign all necessary forms in order for [place] to release information regarding their care.   Consent: Patient/Guardian gives verbal consent for treatment and assignment of benefits for services provided during this visit. Patient/Guardian expressed understanding and agreed to proceed.    Norman Clay, MD 08/26/2022, 12:43 PM

## 2022-08-26 ENCOUNTER — Ambulatory Visit (INDEPENDENT_AMBULATORY_CARE_PROVIDER_SITE_OTHER): Payer: Medicare Other | Admitting: Psychiatry

## 2022-08-26 ENCOUNTER — Encounter: Payer: Self-pay | Admitting: Psychiatry

## 2022-08-26 VITALS — BP 145/84 | HR 79 | Temp 98.3°F | Ht 74.0 in | Wt 178.4 lb

## 2022-08-26 DIAGNOSIS — R4189 Other symptoms and signs involving cognitive functions and awareness: Secondary | ICD-10-CM

## 2022-08-26 DIAGNOSIS — F331 Major depressive disorder, recurrent, moderate: Secondary | ICD-10-CM

## 2022-09-05 ENCOUNTER — Other Ambulatory Visit: Payer: Self-pay | Admitting: Psychiatry

## 2022-09-06 ENCOUNTER — Ambulatory Visit
Admission: RE | Admit: 2022-09-06 | Discharge: 2022-09-06 | Disposition: A | Discharge status: Home / Self Care | Payer: Medicare Other | Source: Ambulatory Visit | Attending: Psychiatry | Admitting: Psychiatry

## 2022-09-06 ENCOUNTER — Encounter: Payer: Self-pay | Admitting: Certified Registered Nurse Anesthetist

## 2022-09-06 DIAGNOSIS — F332 Major depressive disorder, recurrent severe without psychotic features: Secondary | ICD-10-CM

## 2022-09-06 DIAGNOSIS — F32A Depression, unspecified: Secondary | ICD-10-CM | POA: Insufficient documentation

## 2022-09-06 LAB — GLUCOSE, CAPILLARY: Glucose-Capillary: 73 mg/dL (ref 70–99)

## 2022-09-06 MED ORDER — SODIUM CHLORIDE 0.9 % IV SOLN
500.0000 mL | Freq: Once | INTRAVENOUS | Status: AC
  Administered 2022-09-06: 500 mL via INTRAVENOUS

## 2022-09-06 MED ORDER — KETAMINE HCL 10 MG/ML IJ SOLN
INTRAMUSCULAR | Status: DC | PRN
  Administered 2022-09-06: 100 mg via INTRAVENOUS

## 2022-09-06 MED ORDER — GLYCOPYRROLATE 0.2 MG/ML IJ SOLN: 0.1000 mg | Freq: Once | INTRAMUSCULAR | Status: AC

## 2022-09-06 MED ORDER — KETAMINE HCL 50 MG/5ML IJ SOSY
PREFILLED_SYRINGE | INTRAMUSCULAR | Status: AC
  Filled 2022-09-06: qty 5

## 2022-09-06 MED ORDER — MIDAZOLAM HCL 2 MG/2ML IJ SOLN: 2.0000 mg | Freq: Once | INTRAMUSCULAR | Status: DC

## 2022-09-06 MED ORDER — SUCCINYLCHOLINE CHLORIDE 200 MG/10ML IV SOSY
PREFILLED_SYRINGE | INTRAVENOUS | Status: DC | PRN
  Administered 2022-09-06: 100 mg via INTRAVENOUS

## 2022-09-06 MED ORDER — MIDAZOLAM HCL 2 MG/2ML IJ SOLN
INTRAMUSCULAR | Status: DC | PRN
  Administered 2022-09-06: 2 mg via INTRAVENOUS

## 2022-09-06 MED ORDER — MIDAZOLAM HCL 2 MG/2ML IJ SOLN
INTRAMUSCULAR | Status: AC
  Filled 2022-09-06: qty 2

## 2022-09-06 MED ORDER — GLYCOPYRROLATE 0.2 MG/ML IJ SOLN
INTRAMUSCULAR | Status: AC
  Administered 2022-09-06: 0.1 mg via INTRAVENOUS
  Filled 2022-09-06: qty 1

## 2022-09-06 MED ORDER — DEXTROSE-NACL 5-0.45 % IV SOLN: INTRAVENOUS | Status: DC

## 2022-09-06 NOTE — Anesthesia Preprocedure Evaluation (Signed)
Anesthesia Evaluation  Patient identified by MRN, date of birth, ID band Patient awake and Patient confused    Reviewed: Allergy & Precautions, NPO status , Patient's Chart, lab work & pertinent test results  History of Anesthesia Complications (+) PONV and history of anesthetic complications  Airway Mallampati: II  TM Distance: >3 FB Neck ROM: Full    Dental  (+) Caps,    Pulmonary neg pulmonary ROS, sleep apnea    Pulmonary exam normal breath sounds clear to auscultation       Cardiovascular hypertension, Pt. on medications negative cardio ROS Normal cardiovascular exam Rhythm:Regular     Neuro/Psych  Headaches   Depression    negative neurological ROS  negative psych ROS   GI/Hepatic negative GI ROS, Neg liver ROS,,,  Endo/Other  negative endocrine ROSdiabetes, Type 2, Oral Hypoglycemic Agents    Renal/GU negative Renal ROS  negative genitourinary   Musculoskeletal   Abdominal Normal abdominal exam  (+)   Peds negative pediatric ROS (+)  Hematology negative hematology ROS (+)   Anesthesia Other Findings Past Medical History: No date: Cancer ([place]) No date: Depression No date: Depression No date: Diabetes mellitus without complication (HCC)     Comment:  type 2 No date: Dissection of carotid artery (HCC)     Comment:  a. 06/2015 CT Head: abnl thickening of mid-dist R               cervical ICA w/o narrowing - ? vasculitis and               thrombosed/healing dissection; b. 01/2021 Carotid U/S:               1-39% bilat ICA stenoses. No date: Eczema No date: Elevated PSA No date: Epidural hematoma (HCC)     Comment:  a. Age 52 - struck in head w/ baseball --> s/p               craniotomy. 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 Approx 2015 - eval in [place] - conservatively managed.  Past Surgical History: No date: ANKLE ARTHROSCOPY; Left     Comment:  fracture 2007: CHOLECYSTECTOMY 1958: CRANIOTOMY     Comment:  left frontal craniotomy for epidural hematoma 04/07/2022: HIP ARTHROPLASTY; Right     Comment:  Procedure: ARTHROPLASTY BIPOLAR HIP (HEMIARTHROPLASTY);               Surgeon: Earnestine Leys, MD;  Location: [facility] ORS;                Service: Orthopedics;  Laterality: Right; 01/02/2022: INSERTION OF MESH     Comment:  Procedure: INSERTION OF MESH;  Surgeon: Herbert Pun, MD;  Location: [facility] ORS;  Service: General;; No date: LIGAMENT REPAIR; Right     Comment:  thumb No date: RETINAL DETACHMENT SURGERY 2006: TREATMENT FISTULA ANAL  BMI    Body Mass Index: 20.03 kg/m      Reproductive/Obstetrics negative OB ROS  Anesthesia Physical Anesthesia Plan  ASA: 3  Anesthesia Plan: General   Post-op Pain Management:    Induction: Intravenous  PONV Risk Score and Plan: 3 and Propofol infusion and TIVA  Airway Management Planned: Natural Airway and Mask  Additional Equipment:   Intra-op Plan:   Post-operative Plan:   Informed Consent: I have reviewed the patients History and Physical, chart, labs and discussed the procedure including the risks, benefits and alternatives for the proposed anesthesia with the patient or authorized representative who has indicated his/her understanding and acceptance.     Dental Advisory Given  Plan Discussed with: CRNA and Surgeon  Anesthesia Plan Comments: (Patient consented for risks of anesthesia including but not limited to:  - adverse  reactions to medications - risk of airway placement if required - damage to eyes, teeth, lips or other oral mucosa - nerve damage due to positioning  - sore throat or hoarseness - Damage to heart, brain, nerves, lungs, other parts of body or loss of life  Patient voiced understanding.)         Anesthesia Quick Evaluation

## 2022-09-06 NOTE — Anesthesia Procedure Notes (Signed)
Procedure Name: General with mask airway Date/Time: 09/06/2022 1:20 PM  Performed by: Johnna Acosta, CRNAPre-anesthesia Checklist: Patient identified, Emergency Drugs available, Suction available, Patient being monitored and Timeout performed Patient Re-evaluated:Patient Re-evaluated prior to induction Oxygen Delivery Method: Circle system utilized Preoxygenation: Pre-oxygenation with 100% oxygen Induction Type: IV induction

## 2022-09-06 NOTE — Procedures (Signed)
ECT SERVICES Physician's Interval Evaluation & Treatment Note  Patient Identification: John [name] MRN:  [id number] Date of Evaluation:  09/06/2022 TX #: 8  MADRS:   MMSE:   P.E. Findings:  Normal physical exam.  He does have a boot on 1 foot having had a skin ulcer on 1 foot  Psychiatric Interval Note:  Mood stated as stable mild anxiety denies psychosis or suicidal thought  Subjective:  Patient is a 81 y.o. male seen for evaluation for Electroconvulsive Therapy. No specific complaint  Treatment Summary:   '[x]'$   Right Unilateral             '[]'$  Bilateral   % Energy : 0.3 ms 100%   Impedance: 1710 ohms  Seizure Energy Index: 8314 V squared  Postictal Suppression Index: 58%  Seizure Concordance Index: 95%  Medications  Pre Shock: Robinul 0.1 mg labetalol 10 mg ketamine 100 mg succinylcholine 100 mg  Post Shock: Versed 2 mg  Seizure Duration: 31 seconds EMG 72 seconds EEG   Comments: We will not insist on maintenance ECT.  Psychoeducation about appropriate mental health treatment provided.  We would like to penciling man however and ask him to call and cancel if he determines that he definitely does not want to come for follow-up  Lungs:  '[x]'$   Clear to auscultation               '[]'$  Other:   Heart:    '[x]'$   Regular rhythm             '[]'$  irregular rhythm    '[x]'$   Previous H&P reviewed, patient examined and there are NO CHANGES                 '[]'$   Previous H&P reviewed, patient examined and there are changes noted.   John Berthold, MD 11/10/20232:58 PM

## 2022-09-06 NOTE — H&P (Signed)
John [name] is an 81 y.o. male.   Chief Complaint: Patient reports he has not had a return of depression.  Denies suicidal thoughts.  Does describe himself as somewhat anxious.  His wife feels that he is much improved and is requesting that we discontinue ECT treatment after today HPI: 2 recent hospitalizations.  Good response to ECT.  Some improvement with maintenance ECT.  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 06/2015 CT Head: abnl thickening of mid-dist R cervical ICA w/o narrowing - ? vasculitis and thrombosed/healing dissection; b. 01/2021 Carotid U/S: 1-39% bilat ICA stenoses.   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 Age 30 - struck in head w/ baseball --> s/p craniotomy.   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 Approx 2015 - eval in [place] - conservatively managed.    Past Surgical History:  Procedure Laterality Date   ANKLE ARTHROSCOPY Left    fracture   CHOLECYSTECTOMY  2007   [place]   left frontal craniotomy for epidural hematoma   HIP ARTHROPLASTY Right 04/07/2022   Procedure: ARTHROPLASTY BIPOLAR HIP (HEMIARTHROPLASTY);  Surgeon: Earnestine Leys, MD;  Location: [facility] ORS;  Service: Orthopedics;  Laterality: Right;   INSERTION OF MESH  01/02/2022   Procedure: INSERTION OF MESH;  Surgeon: Herbert Pun, MD;  Location: [facility] ORS;  Service: General;;   LIGAMENT REPAIR Right    thumb   RETINAL DETACHMENT SURGERY     TREATMENT FISTULA ANAL  2006    Family History  Problem  Relation Age of Onset   Osteoporosis Mother    Depression Mother    Prostate cancer Father    Hypertension Father    Diabetes Brother    Kidney disease Neg Hx    Social History:  reports that he has never smoked. He has never used smokeless tobacco. He reports that he does not currently use alcohol. He reports that he does not use drugs.  Allergies: No Known Allergies  (Not in a hospital admission)   Results for orders placed or performed during the hospital encounter of 09/06/22 (from the past 48 hour(s))  Glucose, capillary     Status: None   Collection Time: 09/06/22 11:36 AM  Result Value Ref Range   Glucose-Capillary 73 70 - 99 mg/dL    Comment: Glucose reference range applies only to samples taken after fasting for at least 8 hours.   No results found.  Review of Systems  Constitutional: Negative.   HENT: Negative.    Eyes: Negative.   Respiratory: Negative.    Cardiovascular: Negative.   Gastrointestinal: Negative.   Musculoskeletal: Negative.   Skin: Negative.   Neurological: Negative.   Psychiatric/Behavioral:  Negative for dysphoric mood and suicidal ideas. The patient is nervous/anxious.     Blood pressure (!) 162/95, pulse 84, temperature (!)  97.3 F (36.3 C), temperature source Temporal, resp. rate 18, height '6\' 2"'$  (1.88 m), weight 80.9 kg, SpO2 99 %. Physical Exam Vitals reviewed.  Constitutional:      Appearance: He is well-developed.  HENT:     Head: Normocephalic and atraumatic.  Eyes:     Conjunctiva/sclera: Conjunctivae normal.     Pupils: Pupils are equal, round, and reactive to light.  Cardiovascular:     Heart sounds: Normal heart sounds.  Pulmonary:     Effort: Pulmonary effort is normal.  Abdominal:     Palpations: Abdomen is soft.  Musculoskeletal:        General: Normal range of motion.     Cervical back: Normal range of motion.  Skin:    General: Skin is warm and dry.  Neurological:     General: No focal deficit present.      Mental Status: He is alert.  Psychiatric:        Attention and Perception: Attention normal. He is attentive.        Mood and Affect: Mood normal. Affect is not blunt.        Speech: Speech normal.        Behavior: Behavior is cooperative.        Thought Content: Thought content normal.        Cognition and Memory: Cognition normal.      Assessment/Plan I agree that it is his decision and we will not insist on follow-up maintenance ECT.  Psychoeducation about symptoms of depression and importance of mental health follow-up.  We are going to go ahead and pencil him in as an appointment however in about 4 or 5 weeks and ask him to think about it and then to call and cancel if he really does not want it.  [name], MD 09/06/2022, 2:55 PM

## 2022-09-06 NOTE — Transfer of Care (Signed)
Immediate Anesthesia Transfer of Care Note  Patient: John [name]  Procedure(s) Performed: ECT TX  Patient Location: PACU  Anesthesia Type:General  Level of Consciousness: drowsy  Airway & Oxygen Therapy: Patient Spontanous Breathing and Patient connected to face mask oxygen  Post-op Assessment: Report given to RN and Post -op Vital signs reviewed and stable  Post vital signs: Reviewed and stable  Last Vitals:  Vitals Value Taken Time  BP 163/89 09/06/22 1338  Temp    Pulse 77 09/06/22 1338  Resp 15 09/06/22 1338  SpO2 100 % 09/06/22 1338    Last Pain:  Vitals:   09/06/22 1129  TempSrc:   PainSc: 0-No pain         Complications: No notable events documented.

## 2022-09-07 NOTE — Anesthesia Postprocedure Evaluation (Signed)
Anesthesia Post Note  Patient: John [name]  Procedure(s) Performed: ECT TX  Patient location during evaluation: PACU Anesthesia Type: General Level of consciousness: awake and alert Pain management: pain level controlled Vital Signs Assessment: post-procedure vital signs reviewed and stable Respiratory status: spontaneous breathing, nonlabored ventilation, respiratory function stable and patient connected to nasal cannula oxygen Cardiovascular status: blood pressure returned to baseline and stable Postop Assessment: no apparent nausea or vomiting Anesthetic complications: no   No notable events documented.   Last Vitals:  Vitals:   09/06/22 1412 09/06/22 1430  BP: (!) 162/95 (!) 160/90  Pulse: 84 80  Resp: 18 18  Temp: (!) 36.3 C (!) 36.3 C  SpO2:      Last Pain:  Vitals:   09/06/22 1430  TempSrc: Oral  PainSc: 0-No pain                 Ilene Qua

## 2022-09-11 ENCOUNTER — Telehealth: Payer: Self-pay

## 2022-09-11 NOTE — Telephone Encounter (Signed)
pt called states that you was suppose to sent in zoloft '100mg'$  to the pharmacy. pharmacy states that they have not received medication yet.

## 2022-09-11 NOTE — Telephone Encounter (Signed)
The plan was for him to take two tabs of sertraline 50 mg (total of 100 mg), prescribed by Dr. Louis Meckel as the medication was sent in total of 120 tabs by him in Oct. Please advise him to contact us before he runs out of his medication so that I can send in 100 mg tab (this is to avoid the situation of him having many medication at home). If he prefers to have 100 mg tab, I can go ahead and order 100 mg tabs. Please discuss what works better for him.

## 2022-09-12 NOTE — Telephone Encounter (Signed)
left message with instructions and told to call office back.

## 2022-09-18 ENCOUNTER — Telehealth: Payer: Self-pay

## 2022-09-18 NOTE — Telephone Encounter (Signed)
received fax that pt need a rx for the sertraline '100mg'$ 

## 2022-09-18 NOTE — Telephone Encounter (Signed)
left message asking him to call off back that we were calling to see if he needed refill on the sertraline

## 2022-09-18 NOTE — Telephone Encounter (Signed)
Could you contact him and ask about how many sertraline tabs left. Many providers including myself have ordered sertraline (and he was in the hospital)in the last few months. I believe he has medication to last at least for the next few weeks. I would like to avoid the situation of him having many medication as it can be confusing.

## 2022-09-23 NOTE — Telephone Encounter (Signed)
Did you hear back form him in regards to the above message? Please call him again to make sure. Thanks.

## 2022-09-23 NOTE — Telephone Encounter (Signed)
received notice that pharmacy did not received rx for the sertraline hcl '100mg'$ 

## 2022-09-24 ENCOUNTER — Other Ambulatory Visit: Payer: Self-pay | Admitting: Psychiatry

## 2022-09-25 ENCOUNTER — Other Ambulatory Visit: Payer: Self-pay | Admitting: Psychiatry

## 2022-09-25 ENCOUNTER — Telehealth: Payer: Self-pay

## 2022-09-25 MED ORDER — SERTRALINE HCL 100 MG PO TABS: 100.0000 mg | ORAL_TABLET | Freq: Every day | ORAL | 0 refills | Status: DC

## 2022-09-25 NOTE — Telephone Encounter (Signed)
pt needs refills on the sertraline '100mg'$ 

## 2022-09-25 NOTE — Telephone Encounter (Signed)
Ordered

## 2022-10-16 NOTE — Progress Notes (Unsigned)
[facility] MD/PA/NP OP Progress Note  10/17/2022 1:50 PM John [name]  MRN:  [id number]  Chief Complaint:  Chief Complaint  Patient presents with   Follow-up   HPI:  -According to the chart review, he will do home sleep study.  This is a follow-up appointment for depression and insomnia.  He states that he has been doing fine.  He had Thanksgiving with his son and son-in-law.  He states that he felt sad as his daughter decided to spent time with her only this year.  He tends to feel anxious in the morning.  He feels overwhelmed at times.  He is concerned about the property.  His brother is working on this, and they may rent the property.  He is also worried whether Medicare covers recent admission.  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  However, he went for a walk yesterday.  He also did photography of lights, which he is not done for a while.  He reports difficulty in understanding during the conversation or watching TV.  He has hearing aids, and he thinks it is more in relation to comprehension. The patient has mood symptoms as in PHQ-9/GAD-7.  He denies SI, HI, hallucinations.  He decided to stop ECT at this time as he is concerned about his memory. His wife, [name] asks whether it is good for him to be seen for therapy. While discussion with Yvan, he is unsure how he feels comfortable in engaging in therapy session due to difficulty in concentration.  Both of them understands to hold this at this time.    Wt Readings from Last 3 Encounters:  10/17/22 180 lb (81.6 kg)  09/06/22 178 lb 6.4 oz (80.9 kg)  08/26/22 178 lb 6.4 oz (80.9 kg)    Visit Diagnosis:    ICD-10-CM   1. MDD (major depressive disorder), recurrent episode, mild (HCC)  F33.0 EKG 12-Lead    2. Cognitive impairment  R41.89     3. Insomnia, unspecified type  G47.00       Past Psychiatric History: Please see initial evaluation for full details. I have reviewed the history. No updates at this time.     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 06/2015 CT Head: abnl thickening of mid-dist R cervical ICA w/o narrowing - ? vasculitis and thrombosed/healing dissection; b. 01/2021 Carotid U/S: 1-39% bilat ICA stenoses.   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 Age 8 - struck in head w/ baseball --> s/p craniotomy.   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 Approx 2015 - eval in [place] - conservatively managed.    Past Surgical History:  Procedure Laterality Date   ANKLE ARTHROSCOPY Left    fracture   CHOLECYSTECTOMY  2007   [place]  left frontal craniotomy for epidural hematoma   HIP ARTHROPLASTY Right 04/07/2022   Procedure: ARTHROPLASTY BIPOLAR HIP (HEMIARTHROPLASTY);  Surgeon: Earnestine Leys, MD;  Location: [facility] ORS;  Service: Orthopedics;  Laterality: Right;   INSERTION OF MESH  01/02/2022   Procedure: INSERTION OF MESH;  Surgeon: Herbert Pun, MD;  Location: [facility] ORS;  Service: General;;   LIGAMENT REPAIR Right    thumb   RETINAL DETACHMENT SURGERY     TREATMENT FISTULA ANAL  2006    Family Psychiatric History: Please see initial evaluation for full details. I have reviewed the history. No updates at this time.     Family History:  Family History  Problem Relation Age of Onset   Osteoporosis Mother    Depression Mother    Prostate cancer Father    Hypertension Father    Diabetes Brother    Kidney disease Neg Hx     Social History:  Social History    Socioeconomic History   Marital status: Married    Spouse name: [name]   Number of children: 2   Years of education: Not on file   Highest education level: Master's degree (e.g., MA, MS, MEng, MEd, MSW, MBA)  Occupational History   Not on file  Tobacco Use   Smoking status: Never   Smokeless tobacco: Never  Vaping Use   Vaping Use: Never used  Substance and Sexual Activity   Alcohol use: Not Currently   Drug use: No   Sexual activity: Yes    Birth control/protection: None  Other Topics Concern   Not on file  Social History Narrative   Live at home with wife.    Social Determinants of Health   Financial Resource Strain: Not on file  Food Insecurity: Food Insecurity Present (07/09/2022)   Hunger Vital Sign    Worried About Running Out of Food in the Last Year: Sometimes true    Ran Out of Food in the Last Year: Sometimes true  Transportation Needs: No Transportation Needs (07/09/2022)   PRAPARE - Hydrologist (Medical): No    Lack of Transportation (Non-Medical): No  Physical Activity: Not on file  Stress: Not on file  Social Connections: Not on file    Allergies: No Known Allergies  Metabolic Disorder Labs: Lab Results  Component Value Date   HGBA1C 5.6 03/19/2022   MPG 114.02 03/19/2022   No results found for: "PROLACTIN" Lab Results  Component Value Date   CHOL 131 03/19/2022   TRIG 92 03/19/2022   HDL 33 (L) 03/19/2022   CHOLHDL 4.0 03/19/2022   VLDL 18 03/19/2022   LDLCALC 80 03/19/2022   Lab Results  Component Value Date   TSH 0.766 04/01/2022    Therapeutic Level Labs: No results found for: "LITHIUM" No results found for: "VALPROATE" No results found for: "CBMZ"  Current Medications: Current Outpatient Medications  Medication Sig Dispense Refill   aspirin EC 81 MG tablet Take 81 mg by mouth daily.     buPROPion (WELLBUTRIN XL) 300 MG 24 hr tablet Take 1 tablet (300 mg total) by mouth daily. 30 tablet 3    docusate sodium (COLACE) 100 MG capsule Take 1 capsule (100 mg total) by mouth 2 (two) times daily. 60 capsule 3   OLANZapine (ZYPREXA) 10 MG tablet Take 1 tablet (10 mg total) by mouth at bedtime. 30 tablet 3   sertraline (ZOLOFT) 50 MG tablet Take 1 tablet (50 mg total) by mouth at bedtime. 120 tablet 0  traZODone (DESYREL) 50 MG tablet Take 1 tablet (50 mg total) by mouth at bedtime. 30 tablet 3   [START ON 10/26/2022] sertraline (ZOLOFT) 100 MG tablet Take 1 tablet (100 mg total) by mouth daily. 30 tablet 1   No current facility-administered medications for this visit.     Musculoskeletal: Strength & Muscle Tone: within normal limits Gait & Station: normal Patient leans: N/A  Psychiatric Specialty Exam: Review of Systems  Psychiatric/Behavioral:  Positive for decreased concentration and dysphoric mood. Negative for agitation, behavioral problems, confusion, hallucinations, self-injury, sleep disturbance and suicidal ideas. The patient is nervous/anxious. The patient is not hyperactive.   All other systems reviewed and are negative.   Blood pressure (!) 158/88, pulse 68, temperature 98.3 F (36.8 C), temperature source Oral, height '6\' 2"'$  (1.88 m), weight 180 lb (81.6 kg).Body mass index is 23.11 kg/m.  General Appearance: Fairly Groomed  Eye Contact:  Good  Speech:  Clear and Coherent, less speech latency  Volume:  Normal  Mood:   good  Affect:  Appropriate, Congruent, and less restricted  Thought Process:  Coherent  Orientation:  Full (Time, Place, and Person)  Thought Content: Logical   Suicidal Thoughts:  No  Homicidal Thoughts:  No  Memory:  NA  Judgement:  Good  Insight:  Fair  Psychomotor Activity:  Normal, Normal tone, no rigidity, no resting tremors, +postural tremors, no tardive dyskinesia    Concentration:  Concentration: Fair and Attention Span: Fair  Recall:  Good  Fund of Knowledge: Good  Language: Good  Akathisia:  No  Handed:  Right  AIMS (if indicated):  not done  Assets:  Communication Skills Desire for Improvement  ADL's:  Intact  Cognition: WNL  Sleep:  Good   Screenings: AIMS    Flowsheet Row Admission (Discharged) from 04/11/2022 in [facility] Total Score 6      AUDIT    Flowsheet Row Admission (Discharged) from 07/04/2022 in [facility] Admission (Discharged) from 04/04/2022 in [facility] Admission (Discharged) from 03/13/2022 in [facility]  Alcohol Use Disorder Identification Test Final Score (AUDIT) 1 0 0      ECT-MADRS    Flowsheet Row ECT Treatment from 09/06/2022 in [facility] Admission (Discharged) from 03/13/2022 in [facility] Total Score 13 [place] Office Visit from 10/17/2022 in [facility] Office Visit from 08/26/2022 in [facility] Office Visit from 06/11/2022 in [facility] Office Visit from 05/20/2022 in [facility]  Total GAD-7 Score '6 12 10 7      '$ Mini-Mental    Flowsheet Row ECT Treatment from 08/16/2022 in [facility] Admission (Discharged) from 03/13/2022 in [facility]  Total Score (max 30 points ) 30 22      PHQ2-9    [facility] Visit from 10/17/2022 in [facility] Office Visit from 08/26/2022 in [facility] Office Visit from 06/11/2022 in [facility] Office Visit from 05/20/2022 in [facility] from 12/21/2021 in [facility]  PHQ-2 Total Score '2 2 4 2 '$ 0  PHQ-9 Total Score '7 7 13 11 '$ --      Flowsheet Row ECT Treatment from 09/06/2022 in [facility] Office Visit  from 08/26/2022 in [facility]  [facility] ECT Treatment from 08/16/2022 in [facility] RISK CATEGORY Error: Q3, 4, or 5 should not be populated when Q2 is No Error: Q3, 4, or 5 should not be populated when Q2 is No Error: Q3, 4, or 5 should not be populated when Q2 is No        Assessment and Plan:  John [name] is a 81 y.o. year old male with a history of depression, diabetes, hypertension, PAfib, sleep apnea, subdural hematoma, seizure, hip fracture,  right femur after mechanical fall s/p right hip arthroplasty on 6/11, who presents for follow up appointment for below.     1. MDD (major depressive disorder), recurrent episode, mild (HCC) There has been overall improvement in speech latency, rumination since recent admission was medication adjustment and ECT. Psychosocial stressors includes newly found A fib, hip fracture, and financial strain.  Will continue current medication regimen at this time, and will consider a slow uptitration of sertraline if indicated.  Will continue sertraline and bupropion to target depression.  Will continue olanzapine to target depression/rumination.  Will recheck EKG given recent QTc prolongation.  Noted that he decided not to pursue ECT at this time.  He verbalized understanding to consider this treatment if any worsening in his mood.   2. Cognitive impairment Overall improving, although he continues to struggle with concentration.  SLUMS 15/30 when he was in the hospital.  Head CT without remarkable findings except cerebral atrophy and microvascular changes.  Etiology is multifactorial given his ongoing mood symptoms and  ECT treatment. Will continue to assess this.   3. Insomnia, unspecified type Improving.  Will continue current dose of trazodone as needed for insomnia.    Plan  Continue sertraline 100 mg at night  Continue bupropion 300 mg daily  Continue olanzapine 10 mg at night  Continue  Trazodone 50 mg at night as needed for insomnia Obtain EKG (QTc 497 msec 07/2022) Next appointment: 2/13 3:30, in person - Recheck vitamin B12, folate if these are not done at his PCP visit in Jan     The patient demonstrates the following risk factors for suicide: Chronic risk factors for suicide include: psychiatric disorder of depression and previous suicide attempts of putting a bag over his head many years ago, but then he took it off. . Acute risk factors for suicide include: unemployment and loss (financial, interpersonal, professional). Protective factors for this patient include: positive social support and hope for the future. Considering these factors, the overall suicide risk at this point appears to be low. Patient is appropriate for outpatient follow up.           Collaboration of Care: Collaboration of Care: Other reviewed notes in Epic  Patient/Guardian was advised Release of Information must be obtained prior to any record release in order to collaborate their care with an outside provider. Patient/Guardian was advised if they have not already done so to contact the registration department to sign all necessary forms in order for [place] to release information regarding their care.   Consent: Patient/Guardian gives verbal consent for treatment and assignment of benefits for services provided during this visit. Patient/Guardian expressed understanding and agreed to proceed.    Norman Clay, MD 10/17/2022, 1:50 PM

## 2022-10-17 ENCOUNTER — Encounter: Payer: Self-pay | Admitting: Psychiatry

## 2022-10-17 ENCOUNTER — Ambulatory Visit (INDEPENDENT_AMBULATORY_CARE_PROVIDER_SITE_OTHER): Payer: Medicare Other | Admitting: Psychiatry

## 2022-10-17 VITALS — BP 158/88 | HR 68 | Temp 98.3°F | Ht 74.0 in | Wt 180.0 lb

## 2022-10-17 DIAGNOSIS — F330 Major depressive disorder, recurrent, mild: Secondary | ICD-10-CM

## 2022-10-17 DIAGNOSIS — R4189 Other symptoms and signs involving cognitive functions and awareness: Secondary | ICD-10-CM | POA: Diagnosis not present

## 2022-10-17 DIAGNOSIS — G4700 Insomnia, unspecified: Secondary | ICD-10-CM | POA: Diagnosis not present

## 2022-10-17 MED ORDER — SERTRALINE HCL 100 MG PO TABS: 100.0000 mg | ORAL_TABLET | Freq: Every day | ORAL | 1 refills | Status: DC

## 2022-10-17 NOTE — Patient Instructions (Signed)
Continue sertraline 100 mg at night  Continue bupropion 300 mg daily  Continue olanzapine 10 mg at night  Continue Trazodone 50 mg at night as needed for insomnia Obtain EKG  Next appointment: 2/13 3:30

## 2022-10-18 ENCOUNTER — Ambulatory Visit
Admission: RE | Admit: 2022-10-18 | Discharge: 2022-10-18 | Disposition: A | Discharge status: Home / Self Care | Payer: Medicare Other | Source: Ambulatory Visit | Attending: Psychiatry | Admitting: Psychiatry

## 2022-10-18 DIAGNOSIS — F330 Major depressive disorder, recurrent, mild: Secondary | ICD-10-CM | POA: Insufficient documentation

## 2022-10-18 DIAGNOSIS — F332 Major depressive disorder, recurrent severe without psychotic features: Secondary | ICD-10-CM

## 2022-10-19 ENCOUNTER — Other Ambulatory Visit: Payer: Self-pay | Admitting: Psychiatry

## 2022-10-20 ENCOUNTER — Other Ambulatory Visit: Payer: Self-pay | Admitting: Psychiatry

## 2022-10-22 ENCOUNTER — Telehealth: Payer: Self-pay | Admitting: Psychiatry

## 2022-10-22 NOTE — Telephone Encounter (Signed)
Could you contact the patient- I reviewed EKG. No significant changes. Please advise the patient to continue olanzapine at the same dose.   EKG 09/2022: HR 65, QTc 434 msec, first degree AV (this is present on previous EKG)

## 2022-10-23 NOTE — Telephone Encounter (Signed)
left message with results and medication instruction.

## 2022-10-30 ENCOUNTER — Telehealth: Payer: Self-pay

## 2022-10-30 NOTE — Telephone Encounter (Signed)
received fax requesting a refill on the olanzapine     OLANZapine (ZYPREXA) 10 MG tablet Medication Date: 07/31/2022 Department: [facility] BEHAVIORAL MEDICINE Ordering/Authorizing: Parks Ranger, DO   Order Providers  Prescribing Provider Encounter Provider  Parks Ranger, DO None   Outpatient Medication Detail   Disp Refills Start End   OLANZapine (ZYPREXA) 10 MG tablet 30 tablet 3 07/31/2022    Sig - Route: Take 1 tablet (10 mg total) by mouth at bedtime. - Oral   Sent to pharmacy as: OLANZapine (ZYPREXA) 10 MG tablet   E-Prescribing Status: Receipt confirmed by pharmacy (07/31/2022 10:32 AM EDT)    Pharmacy  [id number] [address]

## 2022-10-30 NOTE — Telephone Encounter (Signed)
Could you ask the pharmacy- he should have medication, prescribed by Dr. Louis Meckel, Nunzio Cory to last until Feb.

## 2022-10-30 NOTE — Telephone Encounter (Signed)
pharmacy states that patient last filled on 10-25-22

## 2022-10-31 ENCOUNTER — Other Ambulatory Visit: Payer: Self-pay | Admitting: Psychiatry

## 2022-10-31 MED ORDER — OLANZAPINE 10 MG PO TABS: 10.0000 mg | ORAL_TABLET | Freq: Every day | ORAL | 0 refills | Status: DC

## 2022-10-31 NOTE — Telephone Encounter (Signed)
Medication ordered to fill later this month, fyi.

## 2022-11-14 ENCOUNTER — Telehealth: Payer: Self-pay | Admitting: Psychiatry

## 2022-11-14 NOTE — Telephone Encounter (Signed)
Patient wife came in stating Mr. John [name] has jury duty. Requesting letter to excuse him for jury duty. Will need letter soon, it is set for Feb. 6, 2024. Wife too ROI for Mr. Casaus to sign and will bring back when letter is ready for pick up.

## 2022-11-15 ENCOUNTER — Other Ambulatory Visit: Payer: Self-pay | Admitting: Psychiatry

## 2022-11-15 ENCOUNTER — Encounter: Payer: Self-pay | Admitting: Psychiatry

## 2022-11-15 NOTE — Telephone Encounter (Signed)
The letter is written. I believe it is printed out in the printer at the front desk. If not, could you print it out from Epic, and give it to them? Thanks.

## 2022-11-20 ENCOUNTER — Other Ambulatory Visit: Payer: Self-pay | Admitting: Psychiatry

## 2022-11-20 ENCOUNTER — Ambulatory Visit (INDEPENDENT_AMBULATORY_CARE_PROVIDER_SITE_OTHER): Payer: Medicare Other | Admitting: Podiatry

## 2022-11-20 ENCOUNTER — Encounter: Payer: Self-pay | Admitting: Podiatry

## 2022-11-20 VITALS — BP 139/89 | HR 74

## 2022-11-20 DIAGNOSIS — M79676 Pain in unspecified toe(s): Secondary | ICD-10-CM | POA: Diagnosis not present

## 2022-11-20 DIAGNOSIS — L84 Corns and callosities: Secondary | ICD-10-CM

## 2022-11-20 DIAGNOSIS — E1142 Type 2 diabetes mellitus with diabetic polyneuropathy: Secondary | ICD-10-CM

## 2022-11-20 DIAGNOSIS — B351 Tinea unguium: Secondary | ICD-10-CM | POA: Diagnosis not present

## 2022-11-20 NOTE — Progress Notes (Signed)
He presents today with his wife for diabetic check has recently been in the hospital for an ulceration plantar aspect of the forefoot left there is treated by Dr. Caryl Comes.  Objective: Vital signs are stable he is alert and oriented x 3 pulses are palpable.  Ulcerative lesion is gone on to heal completely plantarly mild benign skin lesion beneath the first metatarsophalangeal joint left foot.  Toenails are slightly elongated.  Assessment: Diabetic polyneuropathy with diabetes well-healed ulceration pain limb secondary to onychomycosis.  Plan: Debridement of nails and benign skin lesions.  Follow-up with [place] in about 3 months we are going to request diabetic shoe molds.

## 2022-11-26 ENCOUNTER — Encounter: Payer: Self-pay | Admitting: Physician Assistant

## 2022-11-26 ENCOUNTER — Ambulatory Visit (INDEPENDENT_AMBULATORY_CARE_PROVIDER_SITE_OTHER): Payer: Medicare Other | Admitting: Physician Assistant

## 2022-11-26 VITALS — BP 120/76 | HR 80 | Ht 72.0 in | Wt 180.0 lb

## 2022-11-26 DIAGNOSIS — Z8546 Personal history of malignant neoplasm of prostate: Secondary | ICD-10-CM | POA: Diagnosis not present

## 2022-11-26 DIAGNOSIS — R3915 Urgency of urination: Secondary | ICD-10-CM

## 2022-11-26 LAB — MICROSCOPIC EXAMINATION

## 2022-11-26 LAB — BLADDER SCAN AMB NON-IMAGING: Scan Result: 14

## 2022-11-26 LAB — URINALYSIS, COMPLETE
Bilirubin, UA: NEGATIVE
Glucose, UA: NEGATIVE
Ketones, UA: NEGATIVE
Leukocytes,UA: NEGATIVE
Nitrite, UA: NEGATIVE
RBC, UA: NEGATIVE
Specific Gravity, UA: >1.03 — ABNORMAL HIGH (ref 1.005–1.030)
Urobilinogen, Ur: 0.2 mg/dL (ref 0.2–1.0)
pH, UA: 5 (ref 5.0–7.5)

## 2022-11-26 MED ORDER — GEMTESA 75 MG PO TABS: 75.0000 mg | ORAL_TABLET | Freq: Every day | ORAL | 0 refills | Status: DC

## 2022-11-26 NOTE — Progress Notes (Signed)
11/26/2022 5:08 PM   John [name] Dec 24, 1940 [id number]  CC: Chief Complaint  Patient presents with   Urinary Frequency    HPI: John [name] is a 82 y.o. 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  He is accompanied today by his wife, who contributes to HPI.  He last saw Dr. Erlene Quan for cystoscopy on 10/02/2021 with findings of enlarged prostate with trilobar coaptation and an elevated bladder neck.  Symptoms were stable and so he elected to continue Flomax alone.  Today he reports progressive worsening in urinary urgency and urge incontinence.  He reports daytime frequency every 30 minutes, nocturia x 1, and wears 2 depends per day which can be wet.  He describes an episode of wetting the bed last night, which is atypical for him.  His wife states that he reported at the time some loss of sensation to on arm and or a leg, laterality unclear.  He does not recall this today and he denies any weakness or numbness in clinic.  He remains on Flomax and denies orthostasis.  He reports dry mouth at baseline.  They report that he has had some leg weakness since undergoing recent hip replacement, for which she has been seen by PT.  He was seen in the emergency department in October 2023 with concern for possible stroke and underwent CT head without contrast and MR brain without contrast with no findings of ventriculomegaly.  In-office UA today positive for trace protein; urine microscopy with many bacteria.  PVR 14 mL.   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 06/2015 CT Head: abnl thickening of mid-dist R cervical ICA w/o narrowing - ? vasculitis and thrombosed/healing dissection; b. 01/2021 Carotid U/S: 1-39% bilat ICA stenoses.   Eczema    Elevated  PSA    Epidural hematoma (HCC)    a. Age 8 - struck in head w/ baseball --> s/p craniotomy.   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 Approx 2015 - eval in [place] - conservatively managed.    Surgical History: Past Surgical History:  Procedure Laterality Date   ANKLE ARTHROSCOPY Left    fracture   CHOLECYSTECTOMY  2007   [place]   left frontal craniotomy for epidural hematoma   HIP ARTHROPLASTY Right 04/07/2022   Procedure: ARTHROPLASTY BIPOLAR HIP (HEMIARTHROPLASTY);  Surgeon: Earnestine Leys, MD;  Location: [facility] ORS;  Service: Orthopedics;  Laterality: Right;   INSERTION OF MESH  01/02/2022   Procedure: INSERTION OF MESH;  Surgeon: Herbert Pun, MD;  Location: [facility] ORS;  Service: General;;   LIGAMENT REPAIR Right    thumb   RETINAL DETACHMENT SURGERY     TREATMENT FISTULA ANAL  2006    Home Medications:  Allergies as of 11/26/2022   No  Known Allergies      Medication List        Accurate as of November 26, 2022  5:08 PM. If you have any questions, ask your nurse or doctor.          aspirin EC 81 MG tablet Take 81 mg by mouth daily.   buPROPion 300 MG 24 hr tablet Commonly known as: WELLBUTRIN XL Take 1 tablet (300 mg total) by mouth daily.   docusate sodium 100 MG capsule Commonly known as: COLACE Take 1 capsule (100 mg total) by mouth 2 (two) times daily.   Gemtesa 75 MG Tabs Generic drug: Vibegron Take 1 tablet (75 mg total) by mouth daily. Started by: Debroah Loop, PA-C   OLANZapine 10 MG tablet Commonly known as: ZYPREXA Take 1 tablet (10 mg total) by mouth at bedtime.   sertraline 50 MG tablet Commonly  known as: ZOLOFT Take 1 tablet (50 mg total) by mouth at bedtime.   sertraline 100 MG tablet Commonly known as: ZOLOFT Take 1 tablet (100 mg total) by mouth daily.   tamsulosin 0.4 MG Caps capsule Commonly known as: FLOMAX Take 0.4 mg by mouth daily.   traZODone 50 MG tablet Commonly known as: DESYREL Take 1 tablet (50 mg total) by mouth at bedtime.        Allergies:  No Known Allergies  Family History: Family History  Problem Relation Age of Onset   Osteoporosis Mother    Depression Mother    Prostate cancer Father    Hypertension Father    Diabetes Brother    Kidney disease Neg Hx     Social History:   reports that he has never smoked. He has never used smokeless tobacco. He reports that he does not currently use alcohol. He reports that he does not use drugs.  Physical Exam: BP 120/76   Pulse 80   Ht 6' (1.829 m)   Wt 180 lb (81.6 kg)   BMI 24.41 kg/m   Constitutional:  Alert and oriented, no acute distress, nontoxic appearing HEENT: [place], AT Cardiovascular: No clubbing, cyanosis, or edema Respiratory: Normal respiratory effort, no increased work of breathing Skin: No rashes, bruises or suspicious lesions Neurologic: Grossly intact, no focal deficits, moving all 4 extremities Psychiatric: Normal mood and affect  Laboratory Data: Results for orders placed or performed in visit on 11/26/22  Microscopic Examination   Urine  Result Value Ref Range   WBC, UA 0-5 0 - 5 /hpf   RBC, Urine 0-2 0 - 2 /hpf   Epithelial Cells (non renal) 0-10 0 - 10 /hpf   Casts Present (A) None seen /lpf   Cast Type Hyaline casts N/A   Mucus, UA Present (A) Not Estab.   Bacteria, UA Many (A) None seen/Few  Urinalysis, Complete  Result Value Ref Range   Specific Gravity, UA >1.030 (H) 1.005 - 1.030   pH, UA 5.0 5.0 - 7.5   Color, UA Orange Yellow   Appearance Ur Clear Clear   Leukocytes,UA Negative Negative   Protein,UA Trace (A) Negative/Trace   Glucose, UA Negative  Negative   Ketones, UA Negative Negative   RBC, UA Negative Negative   Bilirubin, UA Negative Negative   Urobilinogen, Ur 0.2 0.2 - 1.0 mg/dL   Nitrite, UA Negative Negative   Microscopic Examination See below:   Bladder Scan (Post Void Residual) in office  Result Value Ref Range   Scan Result 14    Assessment & Plan:   1. Urinary urgency  Progressive worsening in urinary urgency in the setting of lower extremity weakness.  Differential includes normal pressure hydrocephalus, however there is no evidence of ventriculomegaly on recent brain imaging so I have low concerns for this today.  Given known trilobar coaptation of the prostate, we discussed continuing Flomax and adding on an OAB agent.  I do not think he is a candidate for anticholinergic medications due to his age, cognitive status, and dry mouth at baseline.  Will pursue a trial of Gemtesa and see him back in clinic for symptom recheck in 4 weeks. - Urinalysis, Complete - Bladder Scan (Post Void Residual) in office - Vibegron (GEMTESA) 75 MG TABS; Take 1 tablet (75 mg total) by mouth daily.  Dispense: 28 tablet; Refill: 0  2. History of prostate cancer Due for surveillance PSA, will obtain at next clinic visit.  Return in about 4 weeks (around 12/24/2022) for Symptom recheck with PVR.  Debroah Loop, PA-C  [facility] [address] [place], [place] [address] [phone]

## 2022-11-28 ENCOUNTER — Telehealth: Payer: Self-pay | Admitting: Psychiatry

## 2022-11-28 NOTE — Telephone Encounter (Signed)
Wife stopped by the office stating the pharmacy, [facility] [address], cannot refill the Zyprexa and has tried contacting office. He has a few more days of medication but will need a refill please. Please advise

## 2022-11-28 NOTE — Telephone Encounter (Signed)
Could you please contact the patient and his wife, and identify a local pharmacy that carries olanzapine? Once identified, they can transfer the order to the pharmacy of their choice.

## 2022-11-28 NOTE — Telephone Encounter (Signed)
left message to our office back to let us know what pharmacy they found a pharmcy that carries olanzapine so rx can be transfer.

## 2022-11-29 NOTE — Progress Notes (Unsigned)
BH MD/PA/NP OP Progress Note  12/02/2022 5:25 PM John [name]  MRN:  [id number]  Chief Complaint:  Chief Complaint  Patient presents with   Follow-up   HPI:  This is a follow-up med for depression and insomnia.  He states that he is not doing well.  He tends to have difficulty in the morning due to him being worried.  He spends time in front of computer.  He still have issues with memory and concentration.  He lost balances the other time and fell.  Although he told her that he could not feel his legs that time, he does not recall saying it. The patient has mood symptoms as in PHQ-9/GAD-7. He denies SI.  He does not take a photography of lights anymore due to anhedonia.  He sleeps 8 hours.  He denies decrease in appetite.   [place], his wife presents to the interview.  In the middle of the conversation with Josph Macho, she shows empty olanzapine bottle.  She states that he will run out today.  The pharmacy has reportedly contacted our office (there is no such messages found in Epic).  She also states that it close $100, which may be relation to insurance change. She states that "I said 100 dollars."  After clarifying about the concern about the medication, she agrees to first contact the insurance company/pharmacy to see if it is due to change in insurance.  She states that she is worried about his physical health and mental health.  He fell the other day.  She agrees to contact her PCP regarding this.  She states that she has been frustrated with the care. She thinks he is not getting the needs. She thinks he should be seen by psychologist at least every week. It was discussed with her that, although he will benefit from psychotherapy in the future, his current mental status and cognition are hindering him from fully engaging in the interview. It is advised to first focus on medication adjustments so that he can derive full benefit from therapy. It was also informed that there is no psychologist in our office,  and the appointment will be every other week at best. She verbalized understanding.   Visit Diagnosis:    ICD-10-CM   1. MDD (major depressive disorder), recurrent episode, moderate (HCC)  F33.1     2. Cognitive impairment  R41.89     3. Insomnia, unspecified type  G47.00       Past Psychiatric History: Please see initial evaluation for full details. I have reviewed the history. No updates at this time.     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 06/2015 CT Head: abnl thickening of mid-dist R cervical ICA w/o narrowing - ? vasculitis and thrombosed/healing dissection; b. 01/2021 Carotid U/S: 1-39% bilat ICA stenoses.   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 Age 65 - struck in head w/ baseball --> s/p craniotomy.   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 Approx 2015 - eval in [place] - conservatively managed.    Past Surgical History:  Procedure Laterality Date   ANKLE ARTHROSCOPY Left    fracture   CHOLECYSTECTOMY  2007   [place]   left frontal craniotomy for epidural hematoma   HIP ARTHROPLASTY Right 04/07/2022   Procedure: ARTHROPLASTY BIPOLAR HIP (HEMIARTHROPLASTY);  Surgeon: Earnestine Leys, MD;  Location: [facility] ORS;  Service: Orthopedics;  Laterality: Right;   INSERTION OF MESH  01/02/2022   Procedure: INSERTION OF MESH;  Surgeon: Herbert Pun, MD;  Location: [facility] OR[facility];  Service: General;;   LIGAMENT REPAIR Right    thumb   RETINAL DETACHMENT SURGERY      TREATMENT FISTULA ANAL  2006    Family Psychiatric History: Please see initial evaluation for full details. I have reviewed the history. No updates at this time.     Family History:  Family History  Problem Relation Age of Onset   Osteoporosis Mother    Depression Mother    Prostate cancer Father    Hypertension Father    Diabetes Brother    Kidney disease Neg Hx     Social History:  Social History   Socioeconomic History   Marital status: Married    Spouse name: [name]   Number of children: 2   Years of education: Not on file   Highest education level: Master's degree (e.g., MA, MS, MEng, MEd, MSW, MBA)  Occupational History   Not on file  Tobacco Use   Smoking status: Never   Smokeless tobacco: Never  Vaping Use   Vaping Use: Never used  Substance and Sexual Activity   Alcohol use: Not Currently   Drug use: No   Sexual activity: Yes    Birth control/protection: None  Other Topics Concern   Not on file  Social History Narrative   Live at home with wife.    Social Determinants of Health   Financial Resource Strain: Not on file  Food Insecurity: Food Insecurity Present (07/09/2022)   Hunger Vital Sign    Worried About Running Out of Food in the Last Year: Sometimes true    Ran Out of Food in the Last Year: Sometimes true  Transportation Needs: No Transportation Needs (07/09/2022)   PRAPARE - Hydrologist (Medical): No    Lack of Transportation (Non-Medical): No  Physical Activity: Not on file  Stress: Not on file  Social Connections: Not on file    Allergies: No Known Allergies  Metabolic Disorder Labs: Lab Results  Component Value Date   HGBA1C 5.6 03/19/2022   MPG 114.02 03/19/2022   No results found for: "PROLACTIN" Lab Results  Component Value Date   CHOL 131 03/19/2022   TRIG 92 03/19/2022   HDL 33 (L) 03/19/2022   CHOLHDL 4.0 03/19/2022   VLDL 18 03/19/2022   LDLCALC 80 03/19/2022   Lab Results  Component  Value Date   TSH 0.766 04/01/2022    Therapeutic Level Labs: No results found for: "LITHIUM" No results found for: "VALPROATE" No results found for: "CBMZ"  Current Medications: Current Outpatient Medications  Medication Sig Dispense Refill   aspirin EC 81 MG tablet Take 81 mg by mouth daily.     buPROPion (WELLBUTRIN XL) 300 MG 24 hr tablet Take 1 tablet (300 mg total)  by mouth daily. 90 tablet 0   docusate sodium (COLACE) 100 MG capsule Take 1 capsule (100 mg total) by mouth 2 (two) times daily. 60 capsule 3   tamsulosin (FLOMAX) 0.4 MG CAPS capsule Take 0.4 mg by mouth daily.     traZODone (DESYREL) 50 MG tablet Take 1 tablet (50 mg total) by mouth at bedtime. 30 tablet 3   Vibegron (GEMTESA) 75 MG TABS Take 1 tablet (75 mg total) by mouth daily. 28 tablet 0   OLANZapine (ZYPREXA) 10 MG tablet Take 1 tablet (10 mg total) by mouth at bedtime. 30 tablet 1   sertraline (ZOLOFT) 100 MG tablet Take 1.5 tablets (150 mg total) by mouth daily. 45 tablet 1   No current facility-administered medications for this visit.     Musculoskeletal: Strength & Muscle Tone: within normal limits Gait & Station: normal Patient leans: N/A  Psychiatric Specialty Exam: Review of Systems  Psychiatric/Behavioral:  Positive for dysphoric mood. Negative for agitation, behavioral problems, confusion, decreased concentration, hallucinations, self-injury, sleep disturbance and suicidal ideas. The patient is nervous/anxious. The patient is not hyperactive.   All other systems reviewed and are negative.   Blood pressure 138/84, pulse 62, temperature 98.1 F (36.7 C), height 6' (1.829 m), weight 173 lb (78.5 kg), SpO2 99 %.Body mass index is 23.46 kg/m.  General Appearance: Fairly Groomed  Eye Contact:  Good  Speech:  Clear and Coherent- slight worsening in increased latency  Volume:  Normal  Mood:  Anxious  Affect:  Appropriate, Congruent, and Restricted  Thought Process:  Coherent  Orientation:  Full  (Time, Place, and Person)  Thought Content: Logical   Suicidal Thoughts:  No  Homicidal Thoughts:  No  Memory:  Immediate;   Good  Judgement:  Good  Insight:  Present  Psychomotor Activity:  Normal  Concentration:  Concentration: Good and Attention Span: Good  Recall:  Good  Fund of Knowledge: Good  Language: Good  Akathisia:  No  Handed:  Right  AIMS (if indicated): not done  Assets:  Communication Skills Desire for Improvement  ADL's:  Intact  Cognition: WNL  Sleep:  Good   Screenings: AIMS    Flowsheet Row Admission (Discharged) from 04/11/2022 in [place] Total Score 6      AUDIT    Flowsheet Row Admission (Discharged) from 07/04/2022 in [place] Admission (Discharged) from 04/04/2022 in [place] Admission (Discharged) from 03/13/2022 in [place]  Alcohol Use Disorder Identification Test Final Score (AUDIT) 1 0 0      ECT-MADRS    Flowsheet Row ECT Treatment from 09/06/2022 in [place] Admission (Discharged) from 03/13/2022 in [place] Total Score 13 [place] Office Visit from 12/02/2022 in [place] Office Visit from 10/17/2022 in [place] Office Visit from 08/26/2022 in [place] Office Visit from 06/11/2022 in [place] Office Visit from 05/20/2022 in [place]  Total GAD-7 Score '12 6 12 10 7      '$ Mini-Mental    Flowsheet Row ECT Treatment from 08/16/2022 in [place] Admission (Discharged) from 03/13/2022 in [place]  Total Score (max 30 points ) 30 22      PHQ2-9  [place] Office Visit  from 12/02/2022 in [facility] Office Visit from 10/17/2022 in [facility] Office Visit from 08/26/2022 in [place] Office Visit from 06/11/2022 in [place] Office Visit from 05/20/2022 in [place]  PHQ-2 Total Score '4 2 2 4 2  '$ PHQ-9 Total Score '13 7 7 13 11      '$ [place] Office Visit from 12/02/2022 in [facility] ECT Treatment from 09/06/2022 in [facility] Office Visit from 08/26/2022 in [place] No Risk Error: Q3, 4, or 5 should not be populated when Q2 is No Error: Q3, 4, or 5 should not be populated when Q2 is No        Assessment and Plan:  John [name] is a 82 y.o. year old male with a history of depression, diabetes, hypertension, PAfib, sleep apnea, subdural hematoma, seizure, hip fracture,  right femur after mechanical fall s/p right hip arthroplasty on 6/11, who presents for follow up appointment for below.   1. MDD (major depressive disorder), recurrent episode, moderate (HCC) Acute stressors include: concern about the rental property, financial strain, hip fracture, newly found Afib  Other stressors include: head injury as a teenager s/p removal of the hematoma   History:good response to ECT 2023 (concern about memory loss), admitted to [facility] in May 2023 for depression, SI, and Sept 2023 for depression (initially confused and non verbal)   Exam is notable for slight worsening in speech latency, rumination on his physical health.  Will uptitrate sertraline to optimize treatment for depression and anxiety.  Will continue bupropion to target depression.  Will continue olanzapine to target depression/rumination.  Noted that he decided not to  pursue ECT due to concern of memory loss; this may need to be reassessed in the future due to worsening in his mood symptoms.   2. Cognitive impairment Unstable.[place]  SLUMS 15/30 when he was in the hospital.  Head CT without remarkable findings except cerebral atrophy and microvascular changes.  Etiology is multifactorial given his ongoing mood symptoms and recent ECT treatment. Will continue to assess this.    3. Insomnia, unspecified type Improving.  Will continue current dose of trazodone as needed for insomnia.     Plan  Increase sertraline 150 mg at night  Continue bupropion 300 mg daily  Continue olanzapine 10 mg at night  HR 65, QTc 434 msec, first degree AV 09/2022 Continue Trazodone 50 mg at night as needed for insomnia Next appointment: 4/4 at 11 am for 30 mins, IP - Recheck vitamin B12, folate if these are not done at his PCP visit (reportedly obtained labs this morning)     The patient demonstrates the following risk factors for suicide: Chronic risk factors for suicide include: psychiatric disorder of depression and previous suicide attempts of putting a bag over his head many years ago, but then he took it off. . Acute risk factors for suicide include: unemployment and loss (financial, interpersonal, professional). Protective factors for this patient include: positive social support and hope for the future. Considering these factors, the overall suicide risk at this point appears to be low. Patient is appropriate for outpatient follow up.         Collaboration of Care: Collaboration of Care: Other reviewed notes in Epic  Patient/Guardian was advised Release of  Information must be obtained prior to any record release in order to collaborate their care with an outside provider. Patient/Guardian was advised if they have not already done so to contact the registration department to sign all necessary forms in order for [place] to release information regarding their care.   Consent:  Patient/Guardian gives verbal consent for treatment and assignment of benefits for services provided during this visit. Patient/Guardian expressed understanding and agreed to proceed.    Norman Clay, MD 12/02/2022, 5:25 PM

## 2022-12-02 ENCOUNTER — Encounter: Payer: Self-pay | Admitting: Psychiatry

## 2022-12-02 ENCOUNTER — Ambulatory Visit (INDEPENDENT_AMBULATORY_CARE_PROVIDER_SITE_OTHER): Payer: Medicare Other | Admitting: Psychiatry

## 2022-12-02 ENCOUNTER — Telehealth: Payer: Self-pay

## 2022-12-02 VITALS — BP 138/84 | HR 62 | Temp 98.1°F | Ht 72.0 in | Wt 173.0 lb

## 2022-12-02 DIAGNOSIS — G4700 Insomnia, unspecified: Secondary | ICD-10-CM

## 2022-12-02 DIAGNOSIS — F331 Major depressive disorder, recurrent, moderate: Secondary | ICD-10-CM

## 2022-12-02 DIAGNOSIS — R4189 Other symptoms and signs involving cognitive functions and awareness: Secondary | ICD-10-CM

## 2022-12-02 MED ORDER — SERTRALINE HCL 100 MG PO TABS: 150.0000 mg | ORAL_TABLET | Freq: Every day | ORAL | 1 refills | Status: DC

## 2022-12-02 MED ORDER — OLANZAPINE 10 MG PO TABS: 10.0000 mg | ORAL_TABLET | Freq: Every day | ORAL | 1 refills | Status: DC

## 2022-12-02 NOTE — Telephone Encounter (Signed)
Medication management - Telephone call with patient's wife, 3 times today after she called and left a message patient's Olanzapine would be $99 and too expensive to fill. Assisted collateral with obtaining a coupon to obtain the medication from her local [facility] for $18.20.  Collateral agreed with plan to have [facility] Mess[facility] earlier today to be transferred to the [facility] on [address] and to use coupon sent to them by [facility] to obtain patient's prescribed Olanzapine 10 mg, #30 for $18.20.  Collateral or patient to call back if any other issues filling patient's medications this date.

## 2022-12-02 NOTE — Patient Instructions (Signed)
Increase sertraline 150 mg at night  Continue bupropion 300 mg daily  Continue olanzapine 10 mg at night   Continue Trazodone 50 mg at night as needed for insomnia Next appointment: 4/4 at 11 am

## 2022-12-10 ENCOUNTER — Ambulatory Visit: Payer: Medicare Other | Admitting: Psychiatry

## 2022-12-11 ENCOUNTER — Ambulatory Visit (INDEPENDENT_AMBULATORY_CARE_PROVIDER_SITE_OTHER): Payer: Medicare Other | Admitting: Podiatry

## 2022-12-11 VITALS — BP 165/92 | HR 61 | Temp 97.7°F

## 2022-12-11 DIAGNOSIS — E1142 Type 2 diabetes mellitus with diabetic polyneuropathy: Secondary | ICD-10-CM

## 2022-12-11 DIAGNOSIS — E1169 Type 2 diabetes mellitus with other specified complication: Secondary | ICD-10-CM | POA: Diagnosis not present

## 2022-12-11 DIAGNOSIS — L84 Corns and callosities: Secondary | ICD-10-CM

## 2022-12-11 DIAGNOSIS — M216X2 Other acquired deformities of left foot: Secondary | ICD-10-CM | POA: Diagnosis not present

## 2022-12-11 DIAGNOSIS — E785 Hyperlipidemia, unspecified: Secondary | ICD-10-CM

## 2022-12-11 NOTE — Progress Notes (Signed)
Patient presented to be measured for Diabetic shoes and orthotics, along with his wife.  They were very indecisive about the fitting.  The wife stated that John [name] had asked them to purchase some slip on Skechers, which John [name] stated he likes.  I informed him that if he doesn't want to purchase any shoes, we could possibly order the inserts only.  I informed him that the inserts may be a little thicker than what's in his shoes currently and could possibly make the shoes to tight.  She asked what I suggested.  I told her that I would follow the recommendation of John [name].  John [name] stated that his foot is still bothering him and it's red and swollen.  She said they would rather see a doctor first before they go any further with the whole shoe process.  I scheduled him an appointment with John [name] on today to assess the area and to advise them regarding the shoe and insert recommendation by John [name].

## 2022-12-16 NOTE — Progress Notes (Signed)
  Subjective:  Patient ID: John [name], male    DOB: Sep 26, 1941,  MRN: [id number]  Chief Complaint  Patient presents with   Foot Ulcer    Left foot ulcer plantar foot, patient isnt able to see Dr. Caryl [name] for another week or so. The area isnt draining at this time. No N/V/F noted. The area is swollen and a little pain according to the pain. The patient has just finished the doxycycline and amoxicillin yesterday, but is concerned that they right need more antibiotics .     82 y.o. male presents with the above complaint. History confirmed with patient.   Objective:  Physical Exam: warm, good capillary refill, no trophic changes or ulcerative lesions, normal DP and PT pulses, and he has an abnormal sensory exam with some loss of protective sensation and decree sensation in the tips of the toes, there is a preulcerative callus submetatarsal 1 with hyperkeratosis, this has no drainage or open ulceration or exposed subcutaneous tissue  Assessment:   1. Diabetic polyneuropathy associated with type 2 diabetes mellitus ([place])   2. Pre-ulcerative calluses   3. Type 2 diabetes mellitus with hyperlipidemia ([place])   4. Plantar flexed metatarsal bone of left foot      Plan:  Patient was evaluated and treated and all questions answered.  Patient educated on diabetes. Discussed proper diabetic foot care and discussed risks and complications of disease. Educated patient in depth on reasons to return to the office immediately should he/she discover anything concerning or new on the feet. All questions answered. Discussed proper shoes as well.  I agree with Dr. Stephenie Acres assessment that extra-depth diabetic shoes with multidensity insole would be best to offload the lesion.  I dispensed him dancers pads into his shoes today to help alleviate this for the time being.  Return to see me as needed.  Can leave the lesion open to air and apply moisturizing lotion   Return if symptoms worsen or fail to improve.

## 2022-12-18 ENCOUNTER — Ambulatory Visit (INDEPENDENT_AMBULATORY_CARE_PROVIDER_SITE_OTHER): Payer: Medicare Other

## 2022-12-18 DIAGNOSIS — E1142 Type 2 diabetes mellitus with diabetic polyneuropathy: Secondary | ICD-10-CM

## 2022-12-18 DIAGNOSIS — M216X2 Other acquired deformities of left foot: Secondary | ICD-10-CM

## 2022-12-18 NOTE — Addendum Note (Signed)
Addended by: Rip Harbour on: 12/18/2022 04:24 PM   Modules accepted: Orders

## 2022-12-18 NOTE — Progress Notes (Signed)
Patient presents to the office today for diabetic insole measuring.  Patient was casted for 3 pair of insoles.   ABN signed.   Documentation of medical necessity will be sent to patient's treating diabetic doctor to verify and sign.   Patient's diabetic provider: Juluis Pitch, MD   Shoes and insoles will be ordered at that time and patient will be notified for an appointment for fitting when they arrive.   Insole size ordered: 10.5 W

## 2022-12-25 ENCOUNTER — Telehealth: Payer: Self-pay

## 2022-12-25 NOTE — Telephone Encounter (Signed)
pt wife called states that she will be in the building tomorrow at 2:15 and wanted to know if you would talk to their son and up date him on what has been going on with Josph Macho.

## 2022-12-25 NOTE — Telephone Encounter (Signed)
Unfortunately, I have appointments with other patients during that time. However, I would be happy to speak with their son during his next available appointment if that works for him.

## 2022-12-25 NOTE — Telephone Encounter (Signed)
left message that dr. Modesta Messing could not speak with them tomorrow that they could speak with her at [facility] next appt.

## 2022-12-26 ENCOUNTER — Telehealth: Payer: Self-pay | Admitting: Psychiatry

## 2022-12-26 ENCOUNTER — Ambulatory Visit (INDEPENDENT_AMBULATORY_CARE_PROVIDER_SITE_OTHER): Payer: Medicare Other | Admitting: Physician Assistant

## 2022-12-26 VITALS — BP 134/84 | HR 69 | Ht 72.0 in | Wt 173.0 lb

## 2022-12-26 DIAGNOSIS — Z8546 Personal history of malignant neoplasm of prostate: Secondary | ICD-10-CM

## 2022-12-26 DIAGNOSIS — R3915 Urgency of urination: Secondary | ICD-10-CM | POA: Diagnosis not present

## 2022-12-26 LAB — BLADDER SCAN AMB NON-IMAGING: Scan Result: 51

## 2022-12-26 NOTE — Telephone Encounter (Signed)
Called that number. He was not available on the phone. Will plan to reach out after the return from the leave.

## 2022-12-26 NOTE — Telephone Encounter (Signed)
Patient wife came in to office with Son, John [name], wanting to speak to his physician per the request from yesterday. I let them know you were unavailable and we cannot speak to his son with out ROI signed. 2 way communication, ROI, signed so can speak to his son, who has questions regarding father's health. His number is [phone]. Form in scan box to be scanned and then to be sent to HIM department. They are aware you are out of the office for 2 weeks and would like a call when available.

## 2022-12-26 NOTE — Progress Notes (Signed)
12/26/2022 4:59 PM   John [name] 17-Jun-1941 [id number]  CC: Chief Complaint  Patient presents with   Follow-up   HPI: John [name] is a 82 y.o. 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  He is accompanied today by his wife, who contributes to HPI.  Today he reports unclear symptomatic improvement on Gemtesa.  He states some days he thinks it helps, but other days not so much.  He has also been bothered by slow urinary stream but is primarily bothered by the urgency/frequency.  PVR 51 mL.  His PCP obtained a routine PSA on him earlier this month, which was minimally increased over prior at 0.04, previously undetectable.  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 06/2015 CT Head: abnl thickening of mid-dist R cervical ICA w/o narrowing - ? vasculitis and thrombosed/healing dissection; b. 01/2021 Carotid U/S: 1-39% bilat ICA stenoses.   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 Age 39 - struck in head w/ baseball --> s/p craniotomy.   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 Approx 2015 - eval in [place] - conservatively managed.    Surgical History: Past Surgical History:  Procedure Laterality Date   ANKLE ARTHROSCOPY Left    fracture   CHOLECYSTECTOMY  2007   [place]   left frontal craniotomy for epidural hematoma   HIP ARTHROPLASTY Right 04/07/2022   Procedure:  ARTHROPLASTY BIPOLAR HIP (HEMIARTHROPLASTY);  Surgeon: Earnestine Leys, MD;  Location: [facility] ORS;  Service: Orthopedics;  Laterality: Right;   INSERTION OF MESH  01/02/2022   Procedure: INSERTION OF MESH;  Surgeon: Herbert Pun, MD;  Location: [facility] ORS;  Service: General;;   LIGAMENT REPAIR Right    thumb   RETINAL DETACHMENT SURGERY     TREATMENT FISTULA ANAL  2006    Home Medications:  Allergies as of 12/26/2022   No Known Allergies      Medication List        Accurate as of December 26, 2022  4:59 PM. If you have any questions, ask your nurse or doctor.          aspirin EC 81 MG tablet Take 81 mg by mouth daily.   buPROPion 300 MG 24 hr tablet Commonly known as: WELLBUTRIN XL Take 1 tablet (300 mg total) by mouth daily.   docusate sodium 100 MG capsule Commonly known as: COLACE Take 1 capsule (100 mg total) by mouth 2 (two) times daily.   Gemtesa 75 MG Tabs Generic drug: Vibegron Take 1 tablet (75 mg total) by mouth daily.   OLANZapine 10 MG tablet Commonly known as: ZYPREXA Take 1 tablet (10 mg total) by mouth at bedtime.   sertraline  100 MG tablet Commonly known as: ZOLOFT Take 1.5 tablets (150 mg total) by mouth daily.   tamsulosin 0.4 MG Caps capsule Commonly known as: FLOMAX Take 0.4 mg by mouth daily.   traZODone 50 MG tablet Commonly known as: DESYREL Take 1 tablet (50 mg total) by mouth at bedtime.        Allergies:  No Known Allergies  Family History: Family History  Problem Relation Age of Onset   Osteoporosis Mother    Depression Mother    Prostate cancer Father    Hypertension Father    Diabetes Brother    Kidney disease Neg Hx     Social History:   reports that he has never smoked. He has never used smokeless tobacco. He reports that he does not currently use alcohol. He reports that he does not use drugs.  Physical Exam: BP 134/84   Pulse 69   Ht 6' (1.829 m)   Wt 173 lb (78.5 kg)   BMI 23.46 kg/m    Constitutional:  Alert and oriented, no acute distress, nontoxic appearing HEENT: [place], AT Cardiovascular: No clubbing, cyanosis, or edema Respiratory: Normal respiratory effort, no increased work of breathing Skin: No rashes, bruises or suspicious lesions Neurologic: Grossly intact, no focal deficits, moving all 4 extremities Psychiatric: Normal mood and affect  Laboratory Data: Results for orders placed or performed in visit on 12/26/22  BLADDER SCAN AMB NON-IMAGING  Result Value Ref Range   Scan Result 51 ml    Assessment & Plan:   1. Urinary urgency Unclear symptomatic improvement on Gemtesa and Flomax, now reporting some slow urine stream though he is emptying appropriately today.  I offered him various options including increasing Flomax, stopping Logan Bores, and continuing Gemtesa and he ultimately elected for the latter.  Will plan for symptom recheck and PVR in an additional 2 months.  With his known trilobar coaptation, I think he would ultimately benefit from increasing Flomax to twice daily if he is not at his treatment goal at his follow-up. - BLADDER SCAN AMB NON-IMAGING  2. History of prostate cancer PSA rather stable, will continue to monitor.  Return in about 2 months (around 02/24/2023) for Symptom recheck with PVR.  Debroah Loop, PA-C  [facility] [address], [place], [place] [address] [phone]

## 2022-12-27 ENCOUNTER — Other Ambulatory Visit: Payer: Self-pay | Admitting: Psychiatry

## 2022-12-27 ENCOUNTER — Telehealth: Payer: Self-pay | Admitting: Psychiatry

## 2022-12-27 MED ORDER — SERTRALINE HCL 100 MG PO TABS: 150.0000 mg | ORAL_TABLET | Freq: Every day | ORAL | 1 refills | Status: DC

## 2022-12-27 MED ORDER — TRAZODONE HCL 50 MG PO TABS: 50.0000 mg | ORAL_TABLET | Freq: Every day | ORAL | 1 refills | Status: DC

## 2022-12-27 MED ORDER — SERTRALINE HCL 100 MG PO TABS: 100.0000 mg | ORAL_TABLET | Freq: Every day | ORAL | 0 refills | Status: DC

## 2022-12-27 MED ORDER — SERTRALINE HCL 50 MG PO TABS: 50.0000 mg | ORAL_TABLET | Freq: Every day | ORAL | 0 refills | Status: DC

## 2022-12-27 NOTE — Telephone Encounter (Signed)
Contacted that number to talk with his son. He was not available. I left a message stating that I will call during his appointment to avoid playing phone tag.

## 2022-12-27 NOTE — Telephone Encounter (Signed)
Patient son lvm stating he missed your call yesterday evening. If have time before you leave wants a call back.

## 2022-12-27 NOTE — Telephone Encounter (Signed)
Patient son called stating he miss the call from you yesterday evening 12-26-22. If you are available to call before you leave please do so. I let him know if not that you will call him at next appointment with patient.

## 2023-01-25 NOTE — Progress Notes (Unsigned)
John [name] [name]/PA/NP OP Progress Note  01/28/2023 5:35 PM [name]  MRN:  [id number]  Chief Complaint:  Chief Complaint  Patient presents with   Follow-up   HPI:  This is a follow-up appointment for depression, insomnia and cognitive deficits. He is a limited historian due to him taking time to express his thoughts.  He states that he is doing fair.  When he is asked to elaborate it, he states that "I'm having anxiety (and paused)."  He feels overwhelmed with the responsibility.  He has difficulty in memory, speech as he has difficulty understanding others even when he watches TV.  He has been trying to take care of the financial matters.  He uses computers in the office, and he struggles with this.  He has difficulty in concentration.  His wife mentions that he was able to take a walk altogether when his daughter visit him.  When he is asked about this, he did not make any comment. He has a slight concern about some hand tremors, although it was not apparent during the visit. He sleeps good. He denies SI.  He denies AH, VH.  He has fair appetite. He sleeps up to 8 hours. He denies any side effect from recent uptitration of sertraline.  When ECT is discussed, he states that he would like to think about it.  While his wife strongly recommend psychotherapy, he is unable to answer whether he would like the referral or not.  He agrees to consider it and contact the office if he is interested. He had a fall since the last visit. He was on the way to the bathroom, without using a walker. He was off balance. He denies dizziness, loss of consciousness.   Oriented x4- 4/2, Tuesday, "1924." When asked a few times, he was able to tell "2024."  [name] was diagnosed with seizure disorder years ago.  Although he did not experience any convulsion, he felt he was disconnected from the surrounding.  He has not had any episode in the past 4-5 years. He is not on treatment for seizure.   Discussed extensively with [name],  his son, before and after the treatment with the patient written consent.  He states that John [name] [name] is doing somewhat better.  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  He talks about politics, or can carry out any conversation. His son thinks there was a dramatic change last year.  He was anxious and pacing around the room. It was similar to around 25 years ago when he was admitted to the hospital.  He was doing a lot better when he saw him around thanksgiving.  He was talkative, and his son thought he was back to normal.  However, when [name] saw him a few weeks ago, he was extra anxious and pacing around again.  He is not having the same spirit as he used to have. John [name] asked about the possibility of stroke.  He also asks about the trajectory of the treatment.  It is discussed with him that head MRI in Oct 2023 did not indicate any significant stroke, although there is a documented vascular changes/micro hemorrhages in brain. This Probation officer is currently working on the treatment for depression while evaluating cognitive deficits given his heightened risk.  Treatment option of ECT is also discussed as [name] appeared to have good response according to the chart review and the collaterals. His son  reports preference to hold this treatment.  He was updated regarding the treatment plan as below, and a possible evaluation for his cognition at the next visit.  He verbalized understanding that he can leave a voice message if he has any questions in the future.    Wt Readings from Last 3 Encounters:  01/28/23 165 lb 3.2 oz (74.9 kg)  12/26/22 173 lb (78.5 kg)  12/02/22 173 lb (78.5 kg)     Visit Diagnosis:    ICD-10-CM   1. MDD (major depressive disorder), recurrent episode, moderate  F33.1     2. Cognitive impairment  R41.89 Folate    Vitamin B12    3. Insomnia, unspecified type  G47.00       Past Psychiatric History: Please see  initial evaluation for full details. I have reviewed the history. No updates at this time.     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 06/2015 CT Head: abnl thickening of mid-dist R cervical ICA w/o narrowing - ? vasculitis and thrombosed/healing dissection; b. 01/2021 Carotid U/S: 1-39% bilat ICA stenoses.   Eczema    Elevated PSA    Epidural hematoma    a. Age 63 - struck in head w/ baseball --> s/p craniotomy.   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 Approx 2015 - eval in [place] - conservatively managed.    Past Surgical History:  Procedure Laterality Date   ANKLE ARTHROSCOPY Left    fracture   CHOLECYSTECTOMY  2007   [place]   left frontal craniotomy for epidural hematoma   HIP ARTHROPLASTY Right 04/07/2022   Procedure: ARTHROPLASTY BIPOLAR HIP (HEMIARTHROPLASTY);  Surgeon: Earnestine Leys, [name];  Location: [facility] ORS;  Service: Orthopedics;  Laterality: Right;   INSERTION OF MESH  01/02/2022   Procedure: INSERTION OF MESH;  Surgeon: Herbert Pun, [name];  Location: [facility] ORS;  Service: General;;   LIGAMENT REPAIR Right    thumb   RETINAL DETACHMENT SURGERY     TREATMENT FISTULA ANAL  2006    Family Psychiatric History: Please see initial evaluation for full details. I have reviewed the history. No updates at this time.     Family History:  Family History  Problem Relation Age of Onset   Osteoporosis Mother    Depression Mother    Prostate cancer Father    Hypertension Father    Diabetes Brother    Kidney  disease Neg Hx     Social History:  Social History   Socioeconomic History   Marital status: Married    Spouse name: John [name] [name]   Number of children: 2   Years of education: Not on file   Highest education level: Master's degree (e.g., MA, MS, MEng, MEd, MSW, MBA)  Occupational History   Not on file  Tobacco Use   Smoking status: Never   Smokeless tobacco: Never  Vaping Use  Vaping Use: Never used  Substance and Sexual Activity   Alcohol use: Not Currently   Drug use: No   Sexual activity: Yes    Birth control/protection: None  Other Topics Concern   Not on file  Social History Narrative   Live at home with wife.    Social Determinants of Health   Financial Resource Strain: Not on file  Food Insecurity: Food Insecurity Present (07/09/2022)   Hunger Vital Sign    Worried About Running Out of Food in the Last Year: Sometimes true    Ran Out of Food in the Last Year: Sometimes true  Transportation Needs: No Transportation Needs (07/09/2022)   PRAPARE - Hydrologist (Medical): No    Lack of Transportation (Non-Medical): No  Physical Activity: Not on file  Stress: Not on file  Social Connections: Not on file    Allergies: No Known Allergies  Metabolic Disorder Labs: Lab Results  Component Value Date   HGBA1C 5.6 03/19/2022   MPG 114.02 03/19/2022   No results found for: "PROLACTIN" Lab Results  Component Value Date   CHOL 131 03/19/2022   TRIG 92 03/19/2022   HDL 33 (L) 03/19/2022   CHOLHDL 4.0 03/19/2022   VLDL 18 03/19/2022   LDLCALC 80 03/19/2022   Lab Results  Component Value Date   TSH 0.766 04/01/2022    Therapeutic Level Labs: No results found for: "LITHIUM" No results found for: "VALPROATE" No results found for: "CBMZ"  Current Medications: Current Outpatient Medications  Medication Sig Dispense Refill   aspirin EC 81 MG tablet Take 81 mg by mouth daily.     docusate sodium (COLACE) 100 MG capsule Take 1 capsule  (100 mg total) by mouth 2 (two) times daily. 60 capsule 3   sertraline (ZOLOFT) 100 MG tablet Take 1 tablet (100 mg total) by mouth at bedtime. Total of 150 mg at night. Take along with 50 mg tab 90 tablet 0   sertraline (ZOLOFT) 50 MG tablet Take 1 tablet (50 mg total) by mouth at bedtime. Total of 150 mg at night. Take along with 100 mg tab 90 tablet 0   tamsulosin (FLOMAX) 0.4 MG CAPS capsule Take 0.4 mg by mouth daily.     Vibegron (GEMTESA) 75 MG TABS Take 1 tablet (75 mg total) by mouth daily. 28 tablet 0   buPROPion (WELLBUTRIN XL) 300 MG 24 hr tablet Take 1 tablet (300 mg total) by mouth daily. 90 tablet 0   OLANZapine (ZYPREXA) 10 MG tablet Take 1 tablet (10 mg total) by mouth at bedtime. 30 tablet 1   [START ON 02/25/2023] traZODone (DESYREL) 50 MG tablet Take 1 tablet (50 mg total) by mouth at bedtime. 30 tablet 0   No current facility-administered medications for this visit.     Musculoskeletal: Strength & Muscle Tone: within normal limits Gait & Station:  slow Patient leans: N/A  Psychiatric Specialty Exam: Review of Systems  Psychiatric/Behavioral:  Positive for decreased concentration and dysphoric mood. Negative for agitation, behavioral problems, confusion, hallucinations, self-injury, sleep disturbance and suicidal ideas. The patient is nervous/anxious. The patient is not hyperactive.   All other systems reviewed and are negative.   Blood pressure 134/79, pulse 67, temperature (!) 97.2 F (36.2 C), temperature source Skin, height 6' (1.829 m), weight 165 lb 3.2 oz (74.9 kg).Body mass index is 22.41 kg/m.  General Appearance: Fairly Groomed  Eye Contact:  Good  Speech:  Clear and Coherent  Volume:  Normal  Mood:  Anxious  Affect:  Appropriate, Congruent, and Restricted  Thought Process:  Coherent  Orientation:  Full (Time, Place, and Person)  Thought Content: Logical   Suicidal Thoughts:  No  Homicidal Thoughts:  No  Memory:  Immediate;   Good  Judgement:  Good   Insight:  Good  Psychomotor Activity:  Decreased + postural tremors, no rigidity, no TD  Concentration:  Concentration: Good and Attention Span: Good  Recall:  [place] of Knowledge: Good  Language: Good  Akathisia:  No  Handed:  Right  AIMS (if indicated): not done  Assets:  Communication Skills Desire for Improvement  ADL's:  Intact  Cognition: WNL  Sleep:  Good   Screenings: AIMS    Flowsheet Row Admission (Discharged) from 04/11/2022 in [place] Total Score 6      AUDIT    Flowsheet Row Admission (Discharged) from 07/04/2022 in [place] Admission (Discharged) from 04/04/2022 in [place] Admission (Discharged) from 03/13/2022 in [place]  Alcohol Use Disorder Identification Test Final Score (AUDIT) 1 0 0      ECT-MADRS    Flowsheet Row ECT Treatment from 09/06/2022 in [place] Admission (Discharged) from 03/13/2022 in [place] Total Score [place] Office Visit from 12/02/2022 in [place] Office Visit from 10/17/2022 in [place] Office Visit from 08/26/2022 in [place] Office Visit from 06/11/2022 in [place] Office Visit from 05/20/2022 in [place]  Total GAD-7 Score 12 6 12 10 7       Mini-Mental    Flowsheet Row ECT Treatment from 08/16/2022 in [place] Admission (Discharged) from 03/13/2022 in [place]  Total Score (max 30 points ) 30 22      PHQ2-9    [place] Visit from 12/02/2022 in [place] Office Visit from 10/17/2022 in [place] Office Visit from 08/26/2022 in [place] Office Visit from 06/11/2022 in [place] Office Visit from 05/20/2022 in [place]  PHQ-2 Total Score 4 2 2 4 2   PHQ-9 Total Score 13 7 7 13 11       [place] Office Visit from 12/02/2022 in [place] ECT Treatment from 09/06/2022 in [place] Office Visit from 08/26/2022 in [place] No Risk Error: Q3, 4, or 5 should not be populated when Q2 is No Error: Q3, 4, or 5 should not be populated when Q2 is No        Assessment and Plan:  John [name] [name] is a 82 y.o. year old male with a history of depression, diabetes, hypertension, PAfib, sleep apnea, subdural hematoma, epidural hematoma in 1958,  seizure disorder, hip fracture,  right femur after mechanical fall s/p right hip arthroplasty on 6/11, who presents for follow up appointment for below.   1. MDD (major depressive disorder), recurrent episode, moderate Acute stressors include: concern about the rental property, financial strain, hip fracture, newly found Afib  Other stressors include: head injury as a teenager s/p removal of  the hematoma   History:good response to ECT 2023 (concern about memory loss), [facility] to [facility] for depression, SI, and Sept 2023 for depression (initially confused and non verbal).    There is improvement in rumination on his physical health, although he continues to report depressive symptoms and demonstrate increased speech latency on today's evaluation.  Will continue current medication regimen with the hope that his mood would improve gradually.  Will continue sertraline, bupropion, olanzapine at this time to target depression.  Noted that bupropion was  started with uptitration during  the admission despite his history of seizure disorder.  Will plan to continue at this time given he denies any worsening in seizure. Discussed risk of increase in seizure. Although it was strongly advised to consider ECT treatment given his good response in the past, he would like to hold this at this time.  Of note, his wife requests him to be referred to therapy. Although she initially asks for couples therapy, she verbalized understanding that they need to contact the insurance to find in-network therapist for that treatment.  She then requests for individual therapy; [facility] would like to consider it.  Noted that the writer did not recommend therapy at this time due to potential challenges in engagement due to the current clinician/mood symptoms. However, a referral can be arranged if the patient expresses interest. He acknowledged understanding and agreed to contact the office if interested  2. Cognitive impairment Functional Status   IADL: Independent in the following:            Requires assistance with the following: managing finances, medications, driving ADL  Independent in the following: bathing and hygiene, feeding, continence, grooming and toileting, walking (walker)          Requires assistance with the following: Folate, Vtamin B12,  (TSH 11/2022 wnl) Images: Head MRI 07/2022- 1. No acute intracranial abnormality. 2. Progressed chronic small vessel disease since a 2016 MRI, including fairly numerous chronic micro hemorrhages in both thalami. 3. Prior left frontal craniotomy/cranioplasty with subtle chronic encephalomalacia of underlying frontal gyri. 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 Obtain labs to rule out medical condition contributing to cognitive deficits. Will plan to evaluate with [facility] at the next visit.   # Insomnia Improving.   Will continue current dose of trazodone as needed for insomnia.    Plan  Continue sertraline 150 mg at night  Continue bupropion 300 mg daily  Continue olanzapine 10 mg at night  HR 65, QTc 434 msec, first degree AV 09/2022 Continue Trazodone 50 mg at night as needed for insomnia Next appointment: 5/28 at 10 am for 30 mins, IP Obtain labs (Vitamin B 12, folate)  The patient demonstrates the following risk factors for suicide: Chronic risk factors for suicide include: psychiatric disorder of depression and previous suicide attempts of putting a bag over his head many years ago, but then he took it off. . Acute risk factors for suicide include: unemployment and loss (financial, interpersonal, professional). Protective factors for this patient include: positive social support and hope for the future. Considering these factors, the overall suicide risk at this point appears to be low. Patient is appropriate for outpatient follow up.    The duration of the time spent on the following activities on the date of the encounter was 60 minutes.   Preparing to see the patient (e.g., review of test, records)  Obtaining and/or reviewing separately obtained history  Performing a medically necessary exam and/or evaluation  Counseling and educating the patient/family/caregiver  Ordering medications, tests, or procedures  Referring and communicating with other healthcare professionals (when not reported separately)  Documenting clinical information in the electronic or paper health record  Independently interpreting results of tests/labs and communication of results to the family or caregiver  Care coordination (when not reported separately)    Collaboration of Care: Collaboration of Care: Other reviewed notes in Epic, communicated with his son  Patient/Guardian was advised Release of Information must be obtained prior to any record release in order to collaborate their care with an outside provider.  Patient/Guardian was advised if they have not already done so to contact the registration department to sign all necessary forms in order for [place] to release information regarding their care.   Consent: Patient/Guardian gives verbal consent for treatment and assignment of benefits for services provided during this visit. Patient/Guardian expressed understanding and agreed to proceed.    Norman Clay, [name] 01/28/2023, 5:35 PM

## 2023-01-28 ENCOUNTER — Telehealth: Payer: Self-pay | Admitting: Podiatry

## 2023-01-28 ENCOUNTER — Other Ambulatory Visit
Admission: RE | Admit: 2023-01-28 | Discharge: 2023-01-28 | Disposition: A | Discharge status: Home / Self Care | Payer: Medicare Other | Source: Ambulatory Visit | Attending: Psychiatry | Admitting: Psychiatry

## 2023-01-28 ENCOUNTER — Ambulatory Visit (INDEPENDENT_AMBULATORY_CARE_PROVIDER_SITE_OTHER): Payer: Medicare Other | Admitting: Psychiatry

## 2023-01-28 ENCOUNTER — Encounter: Payer: Self-pay | Admitting: Psychiatry

## 2023-01-28 VITALS — BP 134/79 | HR 67 | Temp 97.2°F | Ht 72.0 in | Wt 165.2 lb

## 2023-01-28 DIAGNOSIS — R4189 Other symptoms and signs involving cognitive functions and awareness: Secondary | ICD-10-CM

## 2023-01-28 DIAGNOSIS — F331 Major depressive disorder, recurrent, moderate: Secondary | ICD-10-CM

## 2023-01-28 DIAGNOSIS — G4700 Insomnia, unspecified: Secondary | ICD-10-CM

## 2023-01-28 LAB — FOLATE: Folate: 11.6 ng/mL (ref >5.9)

## 2023-01-28 LAB — VITAMIN B12: Vitamin B-12: 255 pg/mL (ref 180–914)

## 2023-01-28 MED ORDER — TRAZODONE HCL 50 MG PO TABS: 50.0000 mg | ORAL_TABLET | Freq: Every day | ORAL | 0 refills | Status: DC

## 2023-01-28 MED ORDER — OLANZAPINE 10 MG PO TABS: 10.0000 mg | ORAL_TABLET | Freq: Every day | ORAL | 1 refills | Status: DC

## 2023-01-28 MED ORDER — BUPROPION HCL ER (XL) 300 MG PO TB24: 300.0000 mg | ORAL_TABLET | Freq: Every day | ORAL | 0 refills | Status: DC

## 2023-01-28 NOTE — Progress Notes (Signed)
Called to speak to patient about lab results no answer left voicemail for patient to return call to office

## 2023-01-28 NOTE — Progress Notes (Signed)
Could you inform him that labs are within normal range. Thanks.

## 2023-01-28 NOTE — Telephone Encounter (Signed)
Lmom to call back to schedule appt to pick up orthotics, they are currently in [place].  Will be shipped to [place]  when scheduled.

## 2023-01-30 ENCOUNTER — Ambulatory Visit: Payer: Medicare Other | Admitting: Psychiatry

## 2023-02-11 ENCOUNTER — Telehealth: Payer: Self-pay

## 2023-02-11 NOTE — Telephone Encounter (Signed)
pt was left a message with labwork results. pt notified. left message.

## 2023-02-20 ENCOUNTER — Other Ambulatory Visit: Payer: Self-pay | Admitting: Psychiatry

## 2023-02-24 ENCOUNTER — Ambulatory Visit (INDEPENDENT_AMBULATORY_CARE_PROVIDER_SITE_OTHER): Payer: Medicare Other | Admitting: Physician Assistant

## 2023-02-24 ENCOUNTER — Ambulatory Visit: Payer: Medicare Other | Admitting: Physician Assistant

## 2023-02-24 VITALS — BP 130/76 | HR 68 | Ht 74.0 in | Wt 169.1 lb

## 2023-02-24 DIAGNOSIS — R3915 Urgency of urination: Secondary | ICD-10-CM

## 2023-02-24 LAB — BLADDER SCAN AMB NON-IMAGING: Scan Result: 2

## 2023-02-24 MED ORDER — GEMTESA 75 MG PO TABS: 75.0000 mg | ORAL_TABLET | Freq: Every day | ORAL | 0 refills | Status: DC

## 2023-02-24 NOTE — Progress Notes (Signed)
02/24/2023 5:21 PM   John [name] [birth year] [id number]  CC: Chief Complaint  Patient presents with   Urinary Urgency    HPI: John [name] is a 82 y.o. 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  He is accompanied today by his wife.  Today he reports he has noticed continued improvement on Gemtesa.  He reports daytime frequency x 4-5, nocturia x 1-2, and about 2 episodes of urge incontinence daily.  His most bothersome symptom today is the need to wear diapers.  He wears 2 diapers daily and they are damp.  He notes increased urgency with hearing running water and drinking cranberry juice.  IPSS 13/unhappy as below.  PVR 2 mL.   IPSS     Row Name 02/24/23 1400         International Prostate Symptom Score   How often have you had the sensation of not emptying your bladder? Less than 1 in 5     How often have you had to urinate less than every two hours? About half the time     How often have you found you stopped and started again several times when you urinated? Not at All     How often have you found it difficult to postpone urination? More than half the time     How often have you had a weak urinary stream? About half the time     How often have you had to strain to start urination? Not at All     How many times did you typically get up at night to urinate? 2 Times     Total IPSS Score 13       Quality of Life due to urinary symptoms   If you were to spend the rest of your life with your urinary condition just the way it is now how would you feel about that? Unhappy               PMH: Past Medical History:  Diagnosis Date   Cancer (HCC)    Depression    Depression    Diabetes mellitus without complication (HCC)    type 2   Dissection of carotid artery (HCC)    a. 06/2015 CT Head: abnl thickening of mid-dist R cervical ICA w/o  narrowing - ? vasculitis and thrombosed/healing dissection; b. 01/2021 Carotid U/S: 1-39% bilat ICA stenoses.   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 Age 44 - struck in head w/ baseball --> s/p craniotomy.   Epidural hematoma (HCC)    H/O Osgood-Schlatter disease    Hand deformity, congenital    Hearing loss bilateral   Hemorrhoids    Horner syndrome    Hypertension    Migraine    Obesity    PONV (postoperative nausea and vomiting)    Prostate cancer (HCC) 2020   Rosacea    SDH (subdural hematoma) (HCC)    Sleep apnea    Spontaneous Subdural hematoma (HCC)    a. Approx 2015 - eval in [place] - conservatively managed.    Surgical History: Past Surgical History:  Procedure Laterality Date   ANKLE ARTHROSCOPY Left    fracture   CHOLECYSTECTOMY  2007   CRANIOTOMY  1958   left frontal craniotomy for epidural hematoma   HIP ARTHROPLASTY Right 04/07/2022  Procedure: ARTHROPLASTY BIPOLAR HIP (HEMIARTHROPLASTY);  Surgeon: Deeann Saint, MD;  Location: [facility] ORS;  Service: Orthopedics;  Laterality: Right;   INSERTION OF MESH  01/02/2022   Procedure: INSERTION OF MESH;  Surgeon: Carolan Shiver, MD;  Location: [facility] ORS;  Service: General;;   LIGAMENT REPAIR Right    thumb   RETINAL DETACHMENT SURGERY     TREATMENT FISTULA ANAL  2006    Home Medications:  Allergies as of 02/24/2023   No Known Allergies      Medication List        Accurate as of February 24, 2023  5:21 PM. If you have any questions, ask your nurse or doctor.          aspirin EC 81 MG tablet Take 81 mg by mouth daily.   buPROPion 300 MG 24 hr tablet Commonly known as: WELLBUTRIN XL Take 1 tablet (300 mg total) by mouth daily.   docusate sodium 100 MG capsule Commonly known as: COLACE Take 1 capsule (100 mg total) by mouth 2 (two) times daily.   Gemtesa 75 MG Tabs Generic drug: Vibegron Take 1 tablet (75 mg total) by mouth daily.   OLANZapine 10 MG tablet Commonly known as:  ZYPREXA Take 1 tablet (10 mg total) by mouth at bedtime. Start taking on: Feb 27, 2023   sertraline 100 MG tablet Commonly known as: ZOLOFT Take 1 tablet (100 mg total) by mouth at bedtime. Total of 150 mg at night. Take along with 50 mg tab   sertraline 50 MG tablet Commonly known as: ZOLOFT Take 1 tablet (50 mg total) by mouth at bedtime. Total of 150 mg at night. Take along with 100 mg tab   tamsulosin 0.4 MG Caps capsule Commonly known as: FLOMAX Take 0.4 mg by mouth daily.   traZODone 50 MG tablet Commonly known as: DESYREL Take 1 tablet (50 mg total) by mouth at bedtime. Start taking on: February 25, 2023        Allergies:  No Known Allergies  Family History: Family History  Problem Relation Age of Onset   Osteoporosis Mother    Depression Mother    Prostate cancer Father    Hypertension Father    Diabetes Brother    Kidney disease Neg Hx     Social History:   reports that he has never smoked. He has never used smokeless tobacco. He reports that he does not currently use alcohol. He reports that he does not use drugs.  Physical Exam: BP 130/76   Pulse 68   Ht 6\' 2"  (1.88 m)   Wt 169 lb 1 oz (76.7 kg)   BMI 21.71 kg/m   Constitutional:  Alert and oriented, no acute distress, nontoxic appearing HEENT: [place], AT Cardiovascular: No clubbing, cyanosis, or edema Respiratory: Normal respiratory effort, no increased work of breathing Skin: No rashes, bruises or suspicious lesions Neurologic: Grossly intact, no focal deficits, moving all 4 extremities Psychiatric: Normal mood and affect  Laboratory Data: Results for orders placed or performed in visit on 02/24/23  Bladder Scan (Post Void Residual) in office  Result Value Ref Range   Scan Result 2 ml    Assessment & Plan:   1. Urinary urgency Increased improvement on Gemtesa, but not yet at treatment goal.  He is primarily bothered with urinary leakage.  We had a lengthy conversation today about his treatment  options.  We discussed a trial on increased Flomax versus consideration of third line OAB therapies including PTNS, intravesical Botox, and  InterStim.  With his known trilobar coaptation, I am concerned that he would be at higher risk for incomplete bladder emptying with intravesical Botox.  He was unsure how he would like to proceed, so I gave him brochures today.  Previous notes from Dr. Apolinar Junes discusses consideration of outlet procedures, however given that his most bothersome symptom is urinary leakage, I recommended against that today.  With his history of IMRT, I think he would be at elevated risk for increased urinary leakage with TURP or HoLEP and thus we would likely make his most bothersome symptom worse.  Will keep him on Gemtesa and have him do a trial on increased Flomax at home.  We discussed reducing back to once time daily dosing if his leakage worsens on the increased dose or if he develops orthostasis.  Otherwise, he will follow-up with me if he wishes to pursue third line OAB therapies. - Bladder Scan (Post Void Residual) in office - Vibegron (GEMTESA) 75 MG TABS; Take 1 tablet (75 mg total) by mouth daily.  Dispense: 42 tablet; Refill: 0  Return if symptoms worsen or fail to improve.  Carman Ching, PA-C  [facility] [place] [address] [place], [place] [address] [phone]

## 2023-02-24 NOTE — Patient Instructions (Signed)
Try increasing your Flomax to twice daily for a few days and see if your urinary symptoms improve. If they do, then let me know and I will adjust your prescription. If your urinary symptoms worsen or you develop dizziness/lightheadedness on the increased dose, please go back to once daily dosing.  Common foods that can worsen bladder pain, pain with urination, or urinary urgency/frequency include:  Caffeine Alcohol Carbonated beverages Chocolate Spicy foods Acidic foods including citrus and tomatoes   PTNS INFORMATION:   Urgent PC office-based treatment for overactive bladder Take back control of your life! Urgent PC is a non-drug, non-surgical option for overactive bladder and associated symptoms of urinary urgency, urinary frequency and urge incontinence  Urgent-PC- Since 2003, healthcare professionals have used the Urgent PC Neuromodulation System as an effective office treatment for men and women suffering from overactive bladder, a condition commonly referred to as OAB. Urgent PC is up to 80% effective, even after conservative measures and OAB drugs have failed. Plus, Urgent PC is very low risk, making it a great choice for people unable or unwilling to have more invasive procedures.  How does Urgent PC work?  The Urgent PC system delivers a specific type of neuromodulation called percutaneous tibial nerve stimulation (PTNS). During treatment, a small, slim needle electrode is inserted near your ankle. The needle electrode is then connected to the battery-powered stimulator. During your 30-minute treatment, mild impulses from the stimulator travel through the needle electrode, along your leg and to the nerves in your pelvis that control bladder function. This process is also referred to as neuromodulation.  What will I feel with Urgent PC therapy?  Because patients may experience the sensation of the Urgent PC therapy in different ways, it's difficult to say what the treatment would  feel like to you. Patients often describe the sensation as "tingling" or "pulsating." Treatment is typically well-tolerated by patients. Urgent PC offers many different levels of stimulation, so your clinician will be able to adjust treatment to suit you as well as address any discomfort that you might experience during treatment.  How often will I need Urgent PC treatments? You will receive an initial series of 12 treatments scheduled about a week apart. If you respond, you will likely need a treatment about once per month to maintain your improvements.  How soon will I see results with Urgent PC? Because Urgent PC gently modifies the signals to achieve bladder control, it usually takes 5-7 weeks for symptoms to change. However, patients respond at different rates. In a review of about 100 patients who had success with Urgent PC, symptoms improved anywhere between 2-12 weeks. For about 20% of these patients, the symptoms of urgency and/or urge incontinence didn't improve until after 8 weeks.1  There is no way to anticipate who will respond earlier, later or not at all. That's why it is important to receive the 12 recommended treatments before you and your physician evaluate whether this therapy is an appropriate and effective choice for you.  How can I receive treatment with Urgent PC? Urgent PC is an option for patients with OAB. If you think you have OAB, talk to your doctor, a urologist or urogynecologist. If you have OAB, the doctor will work with you to determine your own personal treatment plan which usually starts with behavior and diet modifications plus medications. Urgent PC is an excellent option if these options don't work or provide sufficient improvements. Treatment with Urgent PC is typically performed at the office of a  urologist, urogynecologist or gynecologist. So, if you run out of options with your normal doctor, consider visiting one of these specialists.  Does insurance cover  Urgent PC? Urgent PC treatment is reimbursed by Medicare across the [place]. Private insurance coverage varies by state. To see if your insurance company covers Urgent PC, use our Coverage Finder or talk to your Healthcare Provider.  Are there patients who should not be treated with Urgent PC? Yes, these include: patients with pacemakers or implantable defibrillators, patients prone to excessive bleeding, patients with nerve damage that could impact either percutaneous tibial nerve or pelvic floor function and patients who are pregnant or planning to become pregnant during the duration of the treatment.  What are the risks associated with Urgent PC? The risks associated with Urgent PC therapy are low. Most common side-effects are temporary and include mild pain or skin inflammation at or near the stimulation site.

## 2023-02-26 ENCOUNTER — Telehealth: Payer: Self-pay

## 2023-02-26 NOTE — Telephone Encounter (Signed)
Could you tell them- unfortunately, there may not be many alternatives to olanzapine, as other medications could be even more expensive. Could you ask if they are willing to try Abilify or Quetiapine? However, he has previously tried both of these medications while in the hospital. He had to discontinue Quetiapine due to drowsiness, and Abilify was stopped because it provided limited benefit at that time. Alternatively, he may slowly reduce olanzapine dose to see if there is any difference.

## 2023-02-26 NOTE — Telephone Encounter (Signed)
pt wife left a message that they could not afford the olanzapine. need something less expensive.  Pt last sen on 4-2 and next appt 5-23

## 2023-02-27 NOTE — Telephone Encounter (Signed)
was able to find a discount card that was within the cost rate that they could affordfor the olanzapine.

## 2023-03-03 ENCOUNTER — Encounter (HOSPITAL_COMMUNITY): Payer: Self-pay

## 2023-03-07 ENCOUNTER — Other Ambulatory Visit: Payer: Medicare Other
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
## 2023-03-11 ENCOUNTER — Encounter: Payer: Self-pay | Admitting: Psychiatry

## 2023-03-11 ENCOUNTER — Ambulatory Visit (INDEPENDENT_AMBULATORY_CARE_PROVIDER_SITE_OTHER): Payer: Medicare Other | Admitting: Psychiatry

## 2023-03-11 VITALS — BP 127/75 | HR 72 | Temp 97.5°F | Ht 74.0 in | Wt 170.2 lb

## 2023-03-11 DIAGNOSIS — F331 Major depressive disorder, recurrent, moderate: Secondary | ICD-10-CM

## 2023-03-11 DIAGNOSIS — G4700 Insomnia, unspecified: Secondary | ICD-10-CM | POA: Diagnosis not present

## 2023-03-11 DIAGNOSIS — R4189 Other symptoms and signs involving cognitive functions and awareness: Secondary | ICD-10-CM | POA: Diagnosis not present

## 2023-03-11 MED ORDER — SERTRALINE HCL 100 MG PO TABS: 100.0000 mg | ORAL_TABLET | Freq: Every day | ORAL | 0 refills | Status: DC

## 2023-03-11 MED ORDER — SERTRALINE HCL 50 MG PO TABS: 50.0000 mg | ORAL_TABLET | Freq: Every day | ORAL | 0 refills | Status: DC

## 2023-03-11 MED ORDER — TRAZODONE HCL 50 MG PO TABS: 50.0000 mg | ORAL_TABLET | Freq: Every day | ORAL | 0 refills | Status: DC

## 2023-03-11 NOTE — Patient Instructions (Signed)
Continue sertraline 150 mg at night  Continue bupropion 300 mg daily  Continue olanzapine 10 mg at night  Continue Trazodone 50 mg at night as needed for insomnia Consider having a follow up appointment with his neurologist for seizure disorder, and possible neurocognitive disorder Next appointment: 7/23 at 10 am

## 2023-03-20 ENCOUNTER — Ambulatory Visit: Payer: Medicare Other | Admitting: Psychiatry

## 2023-03-21 ENCOUNTER — Telehealth: Payer: Self-pay

## 2023-03-21 ENCOUNTER — Other Ambulatory Visit: Payer: Self-pay | Admitting: Physician Assistant

## 2023-03-21 MED ORDER — TAMSULOSIN HCL 0.4 MG PO CAPS: 0.4000 mg | ORAL_CAPSULE | Freq: Two times a day (BID) | ORAL | 11 refills | Status: AC

## 2023-03-21 NOTE — Telephone Encounter (Signed)
Return call to pt. @[phone]  to inform Tamulosin 0.4 mg. Refilled with dosage change per Carman Ching, PA and sent to [facility] pharmacy.  Pt. Verbalized understanding.  1 year follow up appt. Scheduled.

## 2023-03-21 NOTE — Telephone Encounter (Signed)
Pt. Called LM on triage line @1100  requesting refill of Flomax.  States [place], [place] told him to increase his dose at his last visit.

## 2023-03-21 NOTE — Addendum Note (Signed)
Addended by: Sueanne Margarita on: 03/21/2023 12:04 PM   Modules accepted: Orders

## 2023-03-25 ENCOUNTER — Ambulatory Visit: Payer: Medicare Other | Admitting: Psychiatry

## 2023-04-11 ENCOUNTER — Other Ambulatory Visit: Payer: Self-pay | Admitting: Psychiatry

## 2023-04-26 ENCOUNTER — Other Ambulatory Visit: Payer: Self-pay | Admitting: Psychiatry

## 2023-05-05 ENCOUNTER — Telehealth: Payer: Self-pay

## 2023-05-05 ENCOUNTER — Other Ambulatory Visit: Payer: Self-pay | Admitting: Psychiatry

## 2023-05-05 MED ORDER — BUPROPION HCL ER (XL) 300 MG PO TB24: 300.0000 mg | ORAL_TABLET | Freq: Every day | ORAL | 0 refills | Status: DC

## 2023-05-05 NOTE — Telephone Encounter (Signed)
ordered

## 2023-05-05 NOTE — Telephone Encounter (Signed)
pt wife called states that husband needs a refill on the bupropion. pt last seen on 5-14 next appt 7-23

## 2023-05-06 NOTE — Telephone Encounter (Signed)
Message was left notified that rx was sent to the pharmacy

## 2023-05-08 NOTE — Progress Notes (Signed)
BH MD/PA/NP OP Progress Note  05/20/2023 12:08 PM John [name]  MRN:  [id number]  Chief Complaint:  Chief Complaint  Patient presents with   Follow-up   HPI:  This is a follow-up appointment for depression, unspecified anxiety disorder.  He states that he has been doing pretty well.  He tends to feel more anxious in the morning.  He is concerned about rental property, which has not done yet for renting. He reports support from his brother. His son visited them lately, and John [name] enjoyed the visit.  He watches sporting events later in the day.  He also browses computer to look into his medical issues. He [facility]. He agrees to take a balance as he feels anxious when he watches this. The patient has mood symptoms as in PHQ-9/GAD-7. He sleeps well through the night. He denies SI, hallucinations. Jen thinks it has been helpful to see a therapist. He agrees to stay on the medication as below.  John [name], his wife presents to the visit.  She states that he is concerned about his foot ulcer. He also has issues with denture (he will have a new denture in a several months). John [name] contacted the program to arrange a home nurse and home PT John [name] states that this would be helpful to have dressing at home).    Wt Readings from Last 3 Encounters:  05/20/23 176 lb 9.6 oz (80.1 kg)  03/11/23 170 lb 3.2 oz (77.2 kg)  02/24/23 169 lb 1 oz (76.7 kg)     Visit Diagnosis:    ICD-10-CM   1. MDD (major depressive disorder), recurrent episode, mild (HCC)  F33.0     2. Cognitive impairment  R41.89     3. Anxiety disorder, unspecified type  F41.9     4. Insomnia, unspecified type  G47.00       Past Psychiatric History: Please see initial evaluation for full details. I have reviewed the history. No updates at this time.     Past Medical History:  Past Medical History:  Diagnosis Date   Cancer (HCC)    Depression    Depression    Diabetes mellitus without complication (HCC)    type 2    Dissection of carotid artery (HCC)    a. 06/2015 CT Head: abnl thickening of mid-dist R cervical ICA w/o narrowing - ? vasculitis and thrombosed/healing dissection; b. 01/2021 Carotid U/S: 1-39% bilat ICA stenoses.   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 Age 29 - struck in head w/ baseball --> s/p craniotomy.   Epidural hematoma (HCC)    H/O Osgood-Schlatter disease    Hand deformity, congenital    Hearing loss bilateral   Hemorrhoids    Horner syndrome    Hypertension    Migraine    Obesity    PONV (postoperative nausea and vomiting)    Prostate cancer (HCC) 2020   Rosacea    SDH (subdural hematoma) (HCC)    Sleep apnea    Spontaneous Subdural hematoma (HCC)    a. Approx 2015 - eval in [place] - conservatively managed.    Past Surgical History:  Procedure Laterality Date   ANKLE ARTHROSCOPY Left    fracture   CHOLECYSTECTOMY  2007   CRANIOTOMY  1958   left frontal craniotomy for epidural hematoma   HIP ARTHROPLASTY Right 04/07/2022   Procedure: ARTHROPLASTY BIPOLAR HIP (HEMIARTHROPLASTY);  Surgeon: Deeann Saint, MD;  Location: [facility] ORS;  Service: Orthopedics;  Laterality: Right;   INSERTION OF MESH  01/02/2022   Procedure: INSERTION OF MESH;  Surgeon: Carolan Shiver, MD;  Location: [facility] ORS;  Service: General;;   LIGAMENT REPAIR Right    thumb   RETINAL DETACHMENT SURGERY     TREATMENT FISTULA ANAL  2006    Family Psychiatric History: Please see initial evaluation for full details. I have reviewed the history. No updates at this time.    Family History:  Family History  Problem Relation Age of Onset   Osteoporosis Mother    Depression Mother    Prostate cancer Father    Hypertension Father    Diabetes Brother    Kidney disease Neg Hx     Social History:  Social History   Socioeconomic History   Marital status: Married    Spouse name: John [name]   Number of children: 2   Years of education: Not on file   Highest education level: Master's  degree (e.g., MA, MS, MEng, MEd, MSW, MBA)  Occupational History   Not on file  Tobacco Use   Smoking status: Never   Smokeless tobacco: Never  Vaping Use   Vaping status: Never Used  Substance and Sexual Activity   Alcohol use: Not Currently   Drug use: No   Sexual activity: Yes    Birth control/protection: None  Other Topics Concern   Not on file  Social History Narrative   Live at home with wife.    Social Determinants of Health   Financial Resource Strain: Patient Declined (10/30/2022)   Received from [facility], [facility]   Overall Financial Resource Strain (CARDIA)    Difficulty of Paying Living Expenses: Patient declined  Food Insecurity: Patient Declined (10/30/2022)   Received from [facility], [facility]   Hunger Vital Sign    Worried About Running Out of Food in the Last Year: Patient declined    Ran Out of Food in the Last Year: Patient declined  Transportation Needs: Patient Declined (10/30/2022)   Received from [facility], [facility]   [facility] - Transportation    In the past 12 months, has lack of transportation kept you from medical appointments or from getting medications?: Patient declined    Lack of Transportation (Non-Medical): Patient declined  Physical Activity: Not on file  Stress: Not on file  Social Connections: Not on file    Allergies: No Known Allergies  Metabolic Disorder Labs: Lab Results  Component Value Date   HGBA1C 5.6 03/19/2022   MPG 114.02 03/19/2022   No results found for: "PROLACTIN" Lab Results  Component Value Date   CHOL 131 03/19/2022   TRIG 92 03/19/2022   HDL 33 (L) 03/19/2022   CHOLHDL 4.0 03/19/2022   VLDL 18 03/19/2022   LDLCALC 80 03/19/2022   Lab Results  Component Value Date   TSH 0.766 04/01/2022    Therapeutic Level Labs: No results found for: "LITHIUM" No results found for: "VALPROATE" No  results found for: "CBMZ"  Current Medications: Current Outpatient Medications  Medication Sig Dispense Refill   amoxicillin-clavulanate (AUGMENTIN) 875-125 MG tablet Take 1 tablet by mouth 2 (two) times daily.     aspirin EC 81 MG tablet Take 81 mg by mouth daily.     buPROPion (WELLBUTRIN XL) 300 MG 24 hr tablet Take 1 tablet (300 mg total) by mouth daily. 90 tablet 0   docusate sodium (COLACE) 100 MG capsule Take 1 capsule (100  mg total) by mouth 2 (two) times daily. 60 capsule 3   OLANZapine (ZYPREXA) 10 MG tablet Take 1 tablet (10 mg total) by mouth at bedtime. 90 tablet 0   tamsulosin (FLOMAX) 0.4 MG CAPS capsule Take 1 capsule (0.4 mg total) by mouth in the morning and at bedtime. 60 capsule 11   [START ON 06/25/2023] sertraline (ZOLOFT) 100 MG tablet Take 1 tablet (100 mg total) by mouth at bedtime. Total of 150 mg at night. Take along with 50 mg tab 90 tablet 0   [START ON 06/25/2023] sertraline (ZOLOFT) 50 MG tablet Take 1 tablet (50 mg total) by mouth at bedtime. Total of 150 mg at night. Take along with 100 mg tab 90 tablet 0   traZODone (DESYREL) 50 MG tablet Take 1 tablet (50 mg total) by mouth at bedtime. 30 tablet 0   Vibegron (GEMTESA) 75 MG TABS Take 1 tablet (75 mg total) by mouth daily. (Patient not taking: Reported on 05/20/2023) 42 tablet 0   No current facility-administered medications for this visit.     Musculoskeletal: Strength & Muscle Tone: within normal limits Gait & Station: normal Patient leans: N/A  Psychiatric Specialty Exam: Review of Systems  Psychiatric/Behavioral:  Positive for decreased concentration and dysphoric mood. Negative for agitation, behavioral problems, confusion, hallucinations, self-injury, sleep disturbance and suicidal ideas. The patient is nervous/anxious. The patient is not hyperactive.   All other systems reviewed and are negative.   Blood pressure 121/75, pulse 69, temperature 98.7 F (37.1 C), temperature source Skin, height 6\' 2"   (1.88 m), weight 176 lb 9.6 oz (80.1 kg).Body mass index is 22.67 kg/m.  General Appearance: Fairly Groomed  Eye Contact:  Good  Speech:  Clear and Coherent, less speech latency  Volume:  Normal  Mood:   good  Affect:  Appropriate, Congruent, and calm  Thought Process:  Coherent  Orientation:  Full (Time, Place, and Person)  Thought Content: Logical   Suicidal Thoughts:  No  Homicidal Thoughts:  No  Memory:  Immediate;   Good  Judgement:  Good  Insight:  Good  Psychomotor Activity:  Normal  Concentration:  Concentration: Good and Attention Span: Good  Recall:  Good  Fund of Knowledge: Good  Language: Good  Akathisia:  No  Handed:  Right  AIMS (if indicated): not done  Assets:  Communication Skills Desire for Improvement  ADL's:  Intact  Cognition: WNL  Sleep:  Good   Screenings: AIMS    Flowsheet Row Admission (Discharged) from 04/11/2022 in [facility]  AIMS Total Score 6      AUDIT    Flowsheet Row Admission (Discharged) from 07/04/2022 in [facility] BE[facility] MED[facility]INE Admission (Discharged) from 04/04/2022 in [facility] BE[facility] MED[facility] Admission (Discharged) from 03/13/2022 in [facility] BEHAVIORAL MEDICINE  Alcohol Use Disorder Identification Test Final Score (AUDIT) 1 0 0      ECT-MADRS    Flowsheet Row ECT Treatment from 09/06/2022 in [facility] Admission (Discharged) from 03/13/2022 in [facility] BE[facility] MEDICINE  MADRS Total Score 13 42      GAD-7    Flowsheet Row Office Visit from 05/20/2023 in [facility] Office Visit from 03/11/2023 in [facility] Office Visit from 12/02/2022 in [facility] Office Visit from 10/17/2022 in [facility] Office Visit from 08/26/2022 in [facility]   Total GAD-7 Score 3 9  12 6 12       Mini-Mental    Flowsheet Row ECT Treatment from 08/16/2022 in [facility] Admission (Discharged) from 03/13/2022 in [facility] MEDIC[facility]  Total Score (max 30 points ) 30 22      PHQ2-9    Flowsheet Row Office Visit from 05/20/2023 in [facility] Office Visit from 03/11/2023 in [facility] Office Visit from 12/02/2022 in [facility] Office Visit from 10/17/2022 in [facility] Office Visit from 08/26/2022 in [facility]  PHQ-2 Total Score 2 2 4 2 2   PHQ-9 Total Score 8 5 13 7 7       Flowsheet Row Office Visit from 12/02/2022 in [facility] ECT Treatment from 09/06/2022 in [facility] Office Visit from 08/26/2022 in [facility]  C-SSRS RISK CATEGORY No Risk Error: Q3, 4, or 5 should not be populated when Q2 is No Error: Q3, 4, or 5 should not be populated when Q2 is No        Assessment and Plan:  John [name] is a 82 y.o. year old male with a history of depression, diabetes, hypertension, PAfib, sleep apnea, subdural hematoma, epidural hematoma in 1958,  seizure disorder (no episode for the past five years, not on anticonvulsant), hip fracture,  right femur after mechanical fall s/p right hip arthroplasty on 6/11, who presents for follow up appointment for below.   1. MDD (major depressive disorder), recurrent episode, mild (HCC) # Unspecified anxiety disorder Acute stressors include: concern about the rental property, financial strain, hip fracture, newly found Afib  Other stressors include: head injury as a teenager s/p removal of the hematoma   History:good response to ECT 2023 (concern about  memory loss), admitted to [facility] in May 2023 for depression, SI, and Sept 2023 for depression (initially confused and non verbal).    Exam is notable for calmer affect with occasional smile, less rumination on his physical symptoms, and improvement in speech latency.  He reports subjective improvement in depressive symptoms, although he continues to experience anxiety.  Will continue sertraline to target depression, anxiety, along with bupropion and olanzapine of level for depression.  Will not uptitrate bupropion further given his history of seizure; the dose has been maintained at the current dose since his recent admission.  He will greatly benefit from CBT; he will continue to see a therapist.   2. Cognitive impairment Functional Status   IADL: Independent in the following:            Requires assistance with the following: managing finances, medications, driving ADL  Independent in the following: bathing and hygiene, feeding, continence, grooming and toileting, walking (walker)          Requires assistance with the following: Folate, Vitamin B12 wnl 01/2023,  (TSH 11/2022 wnl) Images: Head MRI 07/2022- 1. No acute intracranial abnormality. 2. Progressed chronic small vessel disease since a 2016 MRI, including fairly numerous chronic micro hemorrhages in both thalami. 3. Prior left frontal craniotomy/cranioplasty with subtle chronic encephalomalacia of underlying frontal gyri. 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 He has cognitive deficits as evidenced by MoCA. Etiology is likely  multifactorial as above.  He is not interested in pharmacological intervention at this time.  Will continue to assess this.   # Insomnia Improving.  Will continue current dose of trazodone as needed for  insomnia.    Plan  Continue sertraline 150 mg at night  Continue bupropion 300 mg daily  Continue olanzapine 10 mg at night  HR 65, QTc 434 msec, first degree AV 09/2022 Continue Trazodone 50 mg at night as needed for insomnia Next appointment: 9/17 at 10:30 for 30 mins, IP   The patient demonstrates the following risk factors for suicide: Chronic risk factors for suicide include: psychiatric disorder of depression and previous suicide attempts of putting a bag over his head many years ago, but then he took it off. . Acute risk factors for suicide include: unemployment and loss (financial, interpersonal, professional). Protective factors for this patient include: positive social support and hope for the future. Considering these factors, the overall suicide risk at this point appears to be low. Patient is appropriate for outpatient follow up.   Collaboration of Care: Collaboration of Care: Other reviewed notes in Epic  Patient/Guardian was advised Release of Information must be obtained prior to any record release in order to collaborate their care with an outside provider. Patient/Guardian was advised if they have not already done so to contact the registration department to sign all necessary forms in order for [place] to release information regarding their care.   Consent: Patient/Guardian gives verbal consent for treatment and assignment of benefits for services provided during this visit. Patient/Guardian expressed understanding and agreed to proceed.    Neysa Hotter, MD 05/20/2023, 12:08 PM

## 2023-05-20 ENCOUNTER — Ambulatory Visit (INDEPENDENT_AMBULATORY_CARE_PROVIDER_SITE_OTHER): Payer: Medicare Other | Admitting: Psychiatry

## 2023-05-20 ENCOUNTER — Encounter: Payer: Self-pay | Admitting: Psychiatry

## 2023-05-20 VITALS — BP 121/75 | HR 69 | Temp 98.7°F | Ht 74.0 in | Wt 176.6 lb

## 2023-05-20 DIAGNOSIS — R4189 Other symptoms and signs involving cognitive functions and awareness: Secondary | ICD-10-CM | POA: Diagnosis not present

## 2023-05-20 DIAGNOSIS — F330 Major depressive disorder, recurrent, mild: Secondary | ICD-10-CM

## 2023-05-20 DIAGNOSIS — G4700 Insomnia, unspecified: Secondary | ICD-10-CM | POA: Diagnosis not present

## 2023-05-20 DIAGNOSIS — F419 Anxiety disorder, unspecified: Secondary | ICD-10-CM | POA: Diagnosis not present

## 2023-05-20 MED ORDER — OLANZAPINE 10 MG PO TABS: 10.0000 mg | ORAL_TABLET | Freq: Every day | ORAL | 0 refills | Status: DC

## 2023-05-20 MED ORDER — TRAZODONE HCL 50 MG PO TABS: 50.0000 mg | ORAL_TABLET | Freq: Every evening | ORAL | 1 refills | Status: DC | PRN

## 2023-05-20 MED ORDER — TRAZODONE HCL 50 MG PO TABS: 50.0000 mg | ORAL_TABLET | Freq: Every day | ORAL | 0 refills | Status: DC

## 2023-05-20 MED ORDER — SERTRALINE HCL 50 MG PO TABS: 50.0000 mg | ORAL_TABLET | Freq: Every day | ORAL | 0 refills | Status: DC

## 2023-05-20 MED ORDER — SERTRALINE HCL 100 MG PO TABS: 100.0000 mg | ORAL_TABLET | Freq: Every day | ORAL | 0 refills | Status: DC

## 2023-05-20 NOTE — Patient Instructions (Signed)
Continue sertraline 150 mg at night  Continue bupropion 300 mg daily  Continue olanzapine 10 mg at night  Continue Trazodone 50 mg at night as needed for insomnia Next appointment: 9/17 at 10:30

## 2023-05-23 ENCOUNTER — Telehealth: Payer: Self-pay

## 2023-05-23 NOTE — Telephone Encounter (Signed)
pt wife called states that she is concern that Shlome medication is causing him to have night sweats. Pt last seen on 7-23 next appt 9-17

## 2023-05-23 NOTE — Telephone Encounter (Signed)
Pt wife notified.

## 2023-05-23 NOTE — Telephone Encounter (Signed)
  Please advise her that although we cannot rule out bupropion, sertraline causing sweating (though not necessarily night sweats), I feel hesitant to adjust the medication given that he has been doing well on this combination for the past several months. I would also recommend he be evaluated by his PCP to ensure he does not have any other medical condition that could be causing the night sweats.

## 2023-05-29 ENCOUNTER — Other Ambulatory Visit: Payer: Self-pay | Admitting: Psychiatry

## 2023-06-02 NOTE — Telephone Encounter (Signed)
Could you contact the following pharmacy to cancel the order? It was sent to that pharmacy last week.   [id number] [place], [place] - [address] [place], [place] [address]

## 2023-06-02 NOTE — Telephone Encounter (Signed)
pt wife called wanted olanzapine sent to the [facility] on [address]. pt states pharmacy has sent a request with no reply.

## 2023-06-25 ENCOUNTER — Emergency Department: Payer: Medicare Other

## 2023-06-25 ENCOUNTER — Other Ambulatory Visit: Payer: Self-pay

## 2023-06-25 ENCOUNTER — Inpatient Hospital Stay
Admission: EM | Admit: 2023-06-25 | Discharge: 2023-06-28 | DRG: 623 | Disposition: A | Discharge status: Home Health | Payer: Medicare Other | Attending: Internal Medicine | Admitting: Internal Medicine

## 2023-06-25 DIAGNOSIS — L03032 Cellulitis of left toe: Secondary | ICD-10-CM | POA: Diagnosis present

## 2023-06-25 DIAGNOSIS — Z7982 Long term (current) use of aspirin: Secondary | ICD-10-CM

## 2023-06-25 DIAGNOSIS — G4733 Obstructive sleep apnea (adult) (pediatric): Secondary | ICD-10-CM | POA: Diagnosis present

## 2023-06-25 DIAGNOSIS — L97529 Non-pressure chronic ulcer of other part of left foot with unspecified severity: Secondary | ICD-10-CM | POA: Diagnosis present

## 2023-06-25 DIAGNOSIS — M869 Osteomyelitis, unspecified: Secondary | ICD-10-CM | POA: Diagnosis present

## 2023-06-25 DIAGNOSIS — N400 Enlarged prostate without lower urinary tract symptoms: Secondary | ICD-10-CM | POA: Diagnosis present

## 2023-06-25 DIAGNOSIS — Z833 Family history of diabetes mellitus: Secondary | ICD-10-CM

## 2023-06-25 DIAGNOSIS — F332 Major depressive disorder, recurrent severe without psychotic features: Secondary | ICD-10-CM | POA: Diagnosis present

## 2023-06-25 DIAGNOSIS — E1169 Type 2 diabetes mellitus with other specified complication: Secondary | ICD-10-CM | POA: Diagnosis not present

## 2023-06-25 DIAGNOSIS — Z79899 Other long term (current) drug therapy: Secondary | ICD-10-CM

## 2023-06-25 DIAGNOSIS — B954 Other streptococcus as the cause of diseases classified elsewhere: Secondary | ICD-10-CM | POA: Diagnosis present

## 2023-06-25 DIAGNOSIS — Z96641 Presence of right artificial hip joint: Secondary | ICD-10-CM | POA: Diagnosis present

## 2023-06-25 DIAGNOSIS — E1140 Type 2 diabetes mellitus with diabetic neuropathy, unspecified: Secondary | ICD-10-CM | POA: Diagnosis present

## 2023-06-25 DIAGNOSIS — G4730 Sleep apnea, unspecified: Secondary | ICD-10-CM | POA: Diagnosis present

## 2023-06-25 DIAGNOSIS — Z8262 Family history of osteoporosis: Secondary | ICD-10-CM

## 2023-06-25 DIAGNOSIS — M86672 Other chronic osteomyelitis, left ankle and foot: Secondary | ICD-10-CM | POA: Diagnosis present

## 2023-06-25 DIAGNOSIS — E11628 Type 2 diabetes mellitus with other skin complications: Secondary | ICD-10-CM | POA: Diagnosis present

## 2023-06-25 DIAGNOSIS — Z8042 Family history of malignant neoplasm of prostate: Secondary | ICD-10-CM

## 2023-06-25 DIAGNOSIS — L97522 Non-pressure chronic ulcer of other part of left foot with fat layer exposed: Principal | ICD-10-CM

## 2023-06-25 DIAGNOSIS — Z8546 Personal history of malignant neoplasm of prostate: Secondary | ICD-10-CM

## 2023-06-25 DIAGNOSIS — L97509 Non-pressure chronic ulcer of other part of unspecified foot with unspecified severity: Secondary | ICD-10-CM

## 2023-06-25 DIAGNOSIS — I10 Essential (primary) hypertension: Secondary | ICD-10-CM | POA: Diagnosis present

## 2023-06-25 DIAGNOSIS — Z8249 Family history of ischemic heart disease and other diseases of the circulatory system: Secondary | ICD-10-CM

## 2023-06-25 DIAGNOSIS — E11621 Type 2 diabetes mellitus with foot ulcer: Secondary | ICD-10-CM | POA: Diagnosis not present

## 2023-06-25 DIAGNOSIS — Z1152 Encounter for screening for COVID-19: Secondary | ICD-10-CM

## 2023-06-25 DIAGNOSIS — H9193 Unspecified hearing loss, bilateral: Secondary | ICD-10-CM | POA: Diagnosis present

## 2023-06-25 DIAGNOSIS — D696 Thrombocytopenia, unspecified: Secondary | ICD-10-CM | POA: Insufficient documentation

## 2023-06-25 DIAGNOSIS — I480 Paroxysmal atrial fibrillation: Secondary | ICD-10-CM | POA: Diagnosis present

## 2023-06-25 DIAGNOSIS — E08621 Diabetes mellitus due to underlying condition with foot ulcer: Principal | ICD-10-CM

## 2023-06-25 DIAGNOSIS — E785 Hyperlipidemia, unspecified: Secondary | ICD-10-CM | POA: Diagnosis present

## 2023-06-25 LAB — COMPREHENSIVE METABOLIC PANEL
ALT: 30 U/L (ref 0–44)
AST: 26 U/L (ref 15–41)
Albumin: 3.5 g/dL (ref 3.5–5.0)
Alkaline Phosphatase: 65 U/L (ref 38–126)
Anion gap: 9 (ref 5–15)
BUN: 21 mg/dL (ref 8–23)
CO2: 24 mmol/L (ref 22–32)
Calcium: 9 mg/dL (ref 8.9–10.3)
Chloride: 105 mmol/L (ref 98–111)
Creatinine, Ser: 0.74 mg/dL (ref 0.61–1.24)
GFR, Estimated: >60 mL/min (ref >60)
Glucose, Bld: 92 mg/dL (ref 70–99)
Potassium: 4 mmol/L (ref 3.5–5.1)
Sodium: 138 mmol/L (ref 135–145)
Total Bilirubin: 0.9 mg/dL (ref 0.3–1.2)
Total Protein: 6.3 g/dL — ABNORMAL LOW (ref 6.5–8.1)

## 2023-06-25 LAB — CBC WITH DIFFERENTIAL/PLATELET
Abs Immature Granulocytes: 0.03 10*3/uL (ref 0.00–0.07)
Basophils Absolute: 0 10*3/uL (ref 0.0–0.1)
Basophils Relative: 0 %
Eosinophils Absolute: 0.1 10*3/uL (ref 0.0–0.5)
Eosinophils Relative: 2 %
HCT: 42.2 % (ref 39.0–52.0)
Hemoglobin: 14.3 g/dL (ref 13.0–17.0)
Immature Granulocytes: 1 %
Lymphocytes Relative: 12 %
Lymphs Abs: 0.6 10*3/uL — ABNORMAL LOW (ref 0.7–4.0)
MCH: 31 pg (ref 26.0–34.0)
MCHC: 33.9 g/dL (ref 30.0–36.0)
MCV: 91.5 fL (ref 80.0–100.0)
Monocytes Absolute: 0.5 10*3/uL (ref 0.1–1.0)
Monocytes Relative: 9 %
Neutro Abs: 3.9 10*3/uL (ref 1.7–7.7)
Neutrophils Relative %: 76 %
Platelets: 152 10*3/uL (ref 150–400)
RBC: 4.61 MIL/uL (ref 4.22–5.81)
RDW: 13.7 % (ref 11.5–15.5)
WBC: 5.1 10*3/uL (ref 4.0–10.5)
nRBC: 0 % (ref 0.0–0.2)

## 2023-06-25 LAB — SEDIMENTATION RATE: Sed Rate: 13 mm/hr (ref 0–20)

## 2023-06-25 LAB — SARS CORONAVIRUS 2 BY RT PCR: SARS Coronavirus 2 by RT PCR: NEGATIVE

## 2023-06-25 LAB — LACTIC ACID, PLASMA: Lactic Acid, Venous: 1 mmol/L (ref 0.5–1.9)

## 2023-06-25 MED ORDER — OLANZAPINE 5 MG PO TABS
10.0000 mg | ORAL_TABLET | Freq: Every day | ORAL | Status: DC
  Administered 2023-06-25 – 2023-06-27 (×3): 10 mg via ORAL
  Filled 2023-06-25 (×3): qty 2

## 2023-06-25 MED ORDER — SODIUM CHLORIDE 0.9 % IV SOLN
2.0000 g | Freq: Three times a day (TID) | INTRAVENOUS | Status: DC
  Administered 2023-06-26 – 2023-06-28 (×6): 2 g via INTRAVENOUS
  Filled 2023-06-25 (×9): qty 12.5

## 2023-06-25 MED ORDER — ONDANSETRON HCL 4 MG/2ML IJ SOLN: 4.0000 mg | Freq: Four times a day (QID) | INTRAMUSCULAR | Status: DC | PRN

## 2023-06-25 MED ORDER — TRAZODONE HCL 50 MG PO TABS: 50.0000 mg | ORAL_TABLET | Freq: Every evening | ORAL | Status: DC | PRN

## 2023-06-25 MED ORDER — GADOBUTROL 1 MMOL/ML IV SOLN
8.0000 mL | Freq: Once | INTRAVENOUS | Status: AC | PRN
  Administered 2023-06-25: 8 mL via INTRAVENOUS

## 2023-06-25 MED ORDER — ASPIRIN 81 MG PO TBEC
81.0000 mg | DELAYED_RELEASE_TABLET | Freq: Every day | ORAL | Status: DC
  Administered 2023-06-26 – 2023-06-28 (×3): 81 mg via ORAL
  Filled 2023-06-25 (×4): qty 1

## 2023-06-25 MED ORDER — ACETAMINOPHEN 325 MG PO TABS: 650.0000 mg | ORAL_TABLET | Freq: Four times a day (QID) | ORAL | Status: DC | PRN

## 2023-06-25 MED ORDER — PIPERACILLIN-TAZOBACTAM 3.375 G IVPB 30 MIN
3.3750 g | Freq: Once | INTRAVENOUS | Status: AC
  Administered 2023-06-25: 3.375 g via INTRAVENOUS
  Filled 2023-06-25: qty 50

## 2023-06-25 MED ORDER — SERTRALINE HCL 50 MG PO TABS
50.0000 mg | ORAL_TABLET | Freq: Every day | ORAL | Status: DC
  Administered 2023-06-25 – 2023-06-27 (×3): 50 mg via ORAL
  Filled 2023-06-25 (×3): qty 1

## 2023-06-25 MED ORDER — VANCOMYCIN HCL 1500 MG/300ML IV SOLN
1500.0000 mg | Freq: Once | INTRAVENOUS | Status: AC
  Administered 2023-06-25: 1500 mg via INTRAVENOUS
  Filled 2023-06-25: qty 300

## 2023-06-25 MED ORDER — OXYCODONE HCL 5 MG PO TABS: 5.0000 mg | ORAL_TABLET | Freq: Four times a day (QID) | ORAL | Status: DC | PRN

## 2023-06-25 MED ORDER — TAMSULOSIN HCL 0.4 MG PO CAPS
0.4000 mg | ORAL_CAPSULE | Freq: Two times a day (BID) | ORAL | Status: DC
  Administered 2023-06-25 – 2023-06-28 (×5): 0.4 mg via ORAL
  Filled 2023-06-25 (×5): qty 1

## 2023-06-25 MED ORDER — METRONIDAZOLE 500 MG/100ML IV SOLN
500.0000 mg | Freq: Two times a day (BID) | INTRAVENOUS | Status: DC
  Administered 2023-06-26 – 2023-06-27 (×5): 500 mg via INTRAVENOUS
  Filled 2023-06-25 (×7): qty 100

## 2023-06-25 MED ORDER — POLYETHYLENE GLYCOL 3350 17 G PO PACK: 17.0000 g | PACK | Freq: Every day | ORAL | Status: DC | PRN

## 2023-06-25 MED ORDER — ONDANSETRON HCL 4 MG PO TABS: 4.0000 mg | ORAL_TABLET | Freq: Four times a day (QID) | ORAL | Status: DC | PRN

## 2023-06-25 MED ORDER — SODIUM CHLORIDE 0.9% FLUSH
3.0000 mL | Freq: Two times a day (BID) | INTRAVENOUS | Status: DC
  Administered 2023-06-25 – 2023-06-28 (×5): 3 mL via INTRAVENOUS

## 2023-06-25 MED ORDER — SODIUM CHLORIDE 0.9 % IV SOLN: Freq: Once | INTRAVENOUS | Status: AC

## 2023-06-25 MED ORDER — SERTRALINE HCL 50 MG PO TABS
100.0000 mg | ORAL_TABLET | Freq: Every day | ORAL | Status: DC
  Administered 2023-06-25 – 2023-06-27 (×3): 100 mg via ORAL
  Filled 2023-06-25 (×3): qty 2

## 2023-06-25 MED ORDER — ACETAMINOPHEN 650 MG RE SUPP: 650.0000 mg | Freq: Four times a day (QID) | RECTAL | Status: DC | PRN

## 2023-06-25 MED ORDER — BUPROPION HCL ER (XL) 150 MG PO TB24
300.0000 mg | ORAL_TABLET | Freq: Every day | ORAL | Status: DC
  Administered 2023-06-26 – 2023-06-28 (×3): 300 mg via ORAL
  Filled 2023-06-25 (×4): qty 2

## 2023-06-25 NOTE — ED Triage Notes (Signed)
Pt is being seen Dr. Alberteen Spindle for ulcer on the ball of left foot. Pt reports they did xrays and advised him to come to the ED for an MRI.

## 2023-06-25 NOTE — Progress Notes (Signed)
PHARMACY -  BRIEF ANTIBIOTIC NOTE   Pharmacy has received consult(s) for diabetic foot infection from an ED provider.  The patient's profile has been reviewed for ht/wt/allergies/indication/available labs.    One time order(s) placed for vancomycin and Zosyn  Further antibiotics/pharmacy consults should be ordered by admitting physician if indicated.                       Thank you for involving pharmacy in this patient's care.   Rockwell Alexandria, PharmD Clinical Pharmacist 06/25/2023 5:10 PM

## 2023-06-25 NOTE — ED Notes (Signed)
ED Provider at bedside. 

## 2023-06-25 NOTE — ED Provider Notes (Signed)
[facility] Provider Note    Event Date/Time   First MD Initiated Contact with Patient 06/25/23 1615     (approximate)   History   Ulcer on foot    HPI  John [name] is a 82 y.o. male  here with left foot ulcer. Pt has been treated by Dr. Alberteen Spindle for left foot ulcer x several weeks, just finished a 10d course of Augmentin this week. He went to see Dr. Alberteen Spindle today who obtained XR concerning for "deeper infection" per pt and he was sent in for evaluation and MRI. He did have a temp of 100 the other day but no temps >100.4. Occasional night chills. No drainage. Minimal pain at rest and with ambulation. No new numbness or weakness. No h/o prior surgeries. Saw Dr. Alberteen Spindle today and was sent in for evaluation.       Physical Exam   Triage Vital Signs: ED Triage Vitals  Encounter Vitals Group     BP 06/25/23 1553 119/84     Systolic BP Percentile --      Diastolic BP Percentile --      Pulse Rate 06/25/23 1553 64     Resp 06/25/23 1553 18     Temp 06/25/23 1553 99.1 F (37.3 C)     Temp Source 06/25/23 1553 Oral     SpO2 06/25/23 1553 100 %     Weight 06/25/23 1554 175 lb (79.4 kg)     Height 06/25/23 1554 6\' 2"  (1.88 m)     Head Circumference --      Peak Flow --      Pain Score 06/25/23 1602 0     Pain Loc --      Pain Education --      Exclude from Growth Chart --     Most recent vital signs: Vitals:   06/25/23 1553  BP: 119/84  Pulse: 64  Resp: 18  Temp: 99.1 F (37.3 C)  SpO2: 100%     General: Awake, no distress.  CV:  Good peripheral perfusion. RRR. Resp:  Normal work of breathing. Abd:  No distention. No tenderness. Other:  LLE with open, deep, tracking ulceration to base of first MTP/metatarsal. Minimal surrounding    ED Results / Procedures / Treatments   Labs (all labs ordered are listed, but only abnormal results are displayed) Labs Reviewed  CBC WITH DIFFERENTIAL/PLATELET - Abnormal; Notable for the following components:       Result Value   Lymphs Abs 0.6 (*)    All other components within normal limits  COMPREHENSIVE METABOLIC PANEL - Abnormal; Notable for the following components:   Total Protein 6.3 (*)    All other components within normal limits  SARS CORONAVIRUS 2 BY RT PCR  AEROBIC/ANAEROBIC CULTURE W GRAM STAIN (SURGICAL/DEEP WOUND)  CULTURE, BLOOD (SINGLE)  SEDIMENTATION RATE  LACTIC ACID, PLASMA  LACTIC ACID, PLASMA     EKG    RADIOLOGY MR Foot: Pending   I also independently reviewed and agree with radiologist interpretations.   PROCEDURES:  Critical Care performed: No  .1-3 Lead EKG Interpretation  Performed by: Shaune Pollack, MD Authorized by: Shaune Pollack, MD     Interpretation: normal     ECG rate:  60-80   ECG rate assessment: normal     Rhythm: sinus rhythm     Ectopy: none     Conduction: normal   Comments:     Indication: Weakness     MEDICATIONS ORDERED IN  ED: Medications  vancomycin (VANCOREADY) IVPB 1500 mg/300 mL (has no administration in time range)  piperacillin-tazobactam (ZOSYN) IVPB 3.375 g (has no administration in time range)  0.9 %  sodium chloride infusion ( Intravenous New Bag/Given 06/25/23 1705)  gadobutrol (GADAVIST) 1 MMOL/ML injection 8 mL (8 mLs Intravenous Contrast Given 06/25/23 1749)     IMPRESSION / MDM / ASSESSMENT AND PLAN / ED COURSE  I reviewed the triage vital signs and the nursing notes.                              Differential diagnosis includes, but is not limited to, Osteomyelitis, deep abscess, cellulitis, diabetic ulceration  Patient's presentation is most consistent with acute presentation with potential threat to life or bodily function.  The patient is on the cardiac monitor to evaluate for evidence of arrhythmia and/or significant heart rate changes   82 yo M with h/o depression, DM, epidural hematoma, HTN, here with infected ulceration to left great toe. Pt has free air on exam, sent in from Podiatry for  MRI and IV ABX. Discussed and confirmed with Dr. Alberteen Spindle, his podiatrist. Will check MRI, plan for IV ABX, admission. NPO at [place]. Pt in agreement with this plan. Labs show no significant leukocytosis, no signs of sepsis. Lytes wnl. ESR normal.   FINAL CLINICAL IMPRESSION(S) / ED DIAGNOSES   Final diagnoses:  Diabetic ulcer of toe of left foot associated with diabetes mellitus due to underlying condition, with fat layer exposed (HCC)     Rx / DC Orders   ED Discharge Orders     None        Note:  This document was prepared using Dragon voice recognition software and may include unintentional dictation errors.   Shaune Pollack, MD 06/25/23 [phone]

## 2023-06-25 NOTE — ED Notes (Signed)
Pt to be transported to room via wheelchair. Stable at time of departure 

## 2023-06-25 NOTE — Assessment & Plan Note (Deleted)
Not currently on anticoagulation.  CHA2DS2-VASc is elevated at 4, however both HTN and T2DM are diet controlled at this time. No evidence of RVR at this time to warrant telemetry monitoring.  - Discussed benefits/risk of anticoagulation with patient; he would like to think about it.  Encouraged she continue to have these discussions with his PCP

## 2023-06-25 NOTE — Assessment & Plan Note (Signed)
Patient was seen in the ED, was incidentally diagnosed with diabetes with A1c of 10.8% and a fasting blood sugar above 300.  He was initially started on glipizide and A1c normalized within 6 months and has persistently been less then 5.5% since despite no longer being on glipizide.  - No indication for SSI

## 2023-06-25 NOTE — H&P (Signed)
History and Physical    Patient: John [name] DGU:[id number] DOB: February 16, 1941 DOA: 06/25/2023 DOS: the patient was seen and examined on 06/25/2023 PCP: Dorothey Baseman, MD  Patient coming from: Home  Chief Complaint:  Chief Complaint  Patient presents with   Ulcer on foot    HPI: John [name] is a 82 y.o. male with medical history significant of type 2 diabetes well-controlled with diet only, BPH, night sweats, severe MDD, who presents to the ED due to a foot ulcer  John [name] states he has been dealing with an ulcer on the bottom of his great toe for nearly 1 year now.  Then in the last 1 week, he developed a fever of 101F and swelling that was spreading up his foot and into his ankle region.  Otherwise, he denies any increased pain, but notes he has been having joint pain in that great toe for at least a few months now.  He denies any nausea, vomiting, diarrhea, abdominal pain, chest pain, shortness of breath, palpitations.  ED course: On arrival to the ED, patient was normotensive at 119/84 with heart rate of 64.  He was saturating at 100% on room air.  He was afebrile at 99.1. Initial workup notable for normal CBC and CMP with normal lactic acid.  COVID-19 negative.  Sed rate negative.  MRI of the left foot has been obtained with results pending.  Patient started on Zosyn and vancomycin.  TRH contacted for admission.  Review of Systems: As mentioned in the history of present illness. All other systems reviewed and are negative.  Past Medical History:  Diagnosis Date   Cancer (HCC)    Depression    Depression    Diabetes mellitus without complication (HCC)    type 2   Dissection of carotid artery (HCC)    a. 06/2015 CT Head: abnl thickening of mid-dist R cervical ICA w/o narrowing - ? vasculitis and thrombosed/healing dissection; b. 01/2021 Carotid U/S: 1-39% bilat ICA stenoses.   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 Age 36 - struck in head w/ baseball --> s/p  craniotomy.   Epidural hematoma (HCC)    H/O Osgood-Schlatter disease    Hand deformity, congenital    Hearing loss bilateral   Hemorrhoids    Horner syndrome    Hypertension    Migraine    Obesity    PONV (postoperative nausea and vomiting)    Prostate cancer (HCC) 2020   Rosacea    SDH (subdural hematoma) (HCC)    Sleep apnea    Spontaneous Subdural hematoma (HCC)    a. Approx 2015 - eval in [place] - conservatively managed.   Past Surgical History:  Procedure Laterality Date   ANKLE ARTHROSCOPY Left    fracture   CHOLECYSTECTOMY  2007   CRANIOTOMY  1958   left frontal craniotomy for epidural hematoma   HIP ARTHROPLASTY Right 04/07/2022   Procedure: ARTHROPLASTY BIPOLAR HIP (HEMIARTHROPLASTY);  Surgeon: Deeann Saint, MD;  Location: [facility] ORS;  Service: Orthopedics;  Laterality: Right;   INSERTION OF MESH  01/02/2022   Procedure: INSERTION OF MESH;  Surgeon: Carolan Shiver, MD;  Location: [facility] ORS;  Service: General;;   LIGAMENT REPAIR Right    thumb   RETINAL DETACHMENT SURGERY     TREATMENT FISTULA ANAL  2006   Social History:  reports that he has never smoked. He has never used smokeless tobacco. He reports that he does not currently use alcohol.  He reports that he does not use drugs.  No Known Allergies  Family History  Problem Relation Age of Onset   Osteoporosis Mother    Depression Mother    Prostate cancer Father    Hypertension Father    Diabetes Brother    Kidney disease Neg Hx     Prior to Admission medications   Medication Sig Start Date End Date Taking? Authorizing Provider  amoxicillin-clavulanate (AUGMENTIN) 875-125 MG tablet Take 1 tablet by mouth 2 (two) times daily.    [provider]  aspirin EC 81 MG tablet Take 81 mg by mouth daily.    [provider]  buPROPion (WELLBUTRIN XL) 300 MG 24 hr tablet Take 1 tablet (300 mg total) by mouth daily. 05/05/23 08/03/23  Neysa Hotter, MD  docusate sodium (COLACE) 100 MG capsule Take 1  capsule (100 mg total) by mouth 2 (two) times daily. 07/31/22   Marlou Porch, Richard Edward, DO  OLANZapine (ZYPREXA) 10 MG tablet Take 1 tablet (10 mg total) by mouth at bedtime. 06/02/23 08/31/23  Neysa Hotter, MD  sertraline (ZOLOFT) 100 MG tablet Take 1 tablet (100 mg total) by mouth at bedtime. Total of 150 mg at night. Take along with 50 mg tab 06/25/23 09/23/23  Neysa Hotter, MD  sertraline (ZOLOFT) 50 MG tablet Take 1 tablet (50 mg total) by mouth at bedtime. Total of 150 mg at night. Take along with 100 mg tab 06/25/23 09/23/23  Neysa Hotter, MD  tamsulosin (FLOMAX) 0.4 MG CAPS capsule Take 1 capsule (0.4 mg total) by mouth in the morning and at bedtime. 03/21/23   Carman Ching, PA-C  traZODone (DESYREL) 50 MG tablet Take 1 tablet (50 mg total) by mouth at bedtime as needed for sleep. 06/19/23 12/16/23  Neysa Hotter, MD  Vibegron (GEMTESA) 75 MG TABS Take 1 tablet (75 mg total) by mouth daily. Patient not taking: Reported on 05/20/2023 02/24/23   Carman Ching, PA-C    Physical Exam: Vitals:   06/25/23 1553 06/25/23 1554  BP: 119/84   Pulse: 64   Resp: 18   Temp: 99.1 F (37.3 C)   TempSrc: Oral   SpO2: 100%   Weight:  79.4 kg  Height:  6\' 2"  (1.88 m)   Physical Exam Vitals and nursing note reviewed.  Constitutional:      General: He is not in acute distress.    Appearance: He is normal weight. He is not toxic-appearing.  HENT:     Mouth/Throat:     Mouth: Mucous membranes are moist.     Pharynx: Oropharynx is clear.  Eyes:     Conjunctiva/sclera: Conjunctivae normal.     Pupils: Pupils are equal, round, and reactive to light.  Cardiovascular:     Rate and Rhythm: Normal rate and regular rhythm.     Heart sounds: No murmur heard.    No gallop.  Pulmonary:     Effort: Pulmonary effort is normal. No respiratory distress.     Breath sounds: Normal breath sounds. No wheezing, rhonchi or rales.  Musculoskeletal:     Right lower leg: No edema.     Left lower  leg: Edema (pitting edema extending just beyond the inferior aspect of the left lower extremity) present.  Skin:    General: Skin is warm and dry.     Comments: Ulcer present on the plantar aspect of the 1st MTP joint with serosanguinous drainage. Visualization limited by wrapping.   Neurological:     Mental Status: He is alert  and oriented to person, place, and time. Mental status is at baseline.  Psychiatric:        Mood and Affect: Mood normal.        Behavior: Behavior normal.    Data Reviewed: CBC with WBC of 5.1, hemoglobin of 14.3, MCV of 91.5, platelets of 152 CMP with sodium of 138, potassium 4.0, bicarb 24, glucose 92, BUN 21, creatinine 0.74, AST 26, ALT 38, GFR above 60 Lactic acid 1.0 Cova-19 PCR negative ESR within normal limits at 13  Results are pending, will review when available.  Assessment and Plan:  * Foot ulcer (HCC) Patient is presenting with a left foot ulcer that has been present for several months, but acutely worsened.  He has been previously characterized as a diabetic ulcer, however he was newly diagnosed in January 2023 with A1c of 10.6% that rapidly normalized to below 6% and is now well-controlled off medications, overall making me wonder if this may have an alternative etiology.  - Podiatry consulted; appreciate their recommendations - Continue vancomycin per pharmacy dosing - Transition from Zosyn to Cefepime and Flagyl  - MRI of the left foot pending - ABIs  Type 2 diabetes mellitus with hyperlipidemia (HCC) Patient was seen in the ED, was incidentally diagnosed with diabetes with A1c of 10.8% and a fasting blood sugar above 300.  He was initially started on glipizide and A1c normalized within 6 months and has persistently been less then 5.5% since despite no longer being on glipizide.  - No indication for SSI  Paroxysmal atrial fibrillation [facility]) Not currently on anticoagulation.  CHA2DS2-VASc is elevated at 4, however both HTN and T2DM are  diet controlled at this time. No evidence of RVR at this time to warrant telemetry monitoring.  - Discussed benefits/risk of anticoagulation with patient; he would like to think about it.  Encouraged she continue to have these discussions with his PCP  Essential hypertension Patient's blood pressure is relatively well-controlled with diet only.  No indication to start antihypertensives at this time.  Severe recurrent major depression without psychotic features (HCC) - Continue home bupropion, sertraline, olanzapine  Apnea, sleep No longer using a CPAP at this time given updated sleep study demonstrated resolution of OSA.  Advance Care Planning:   Code Status: Full Code verified by patient with wife at bedside  Consults: Podiatry  Family Communication: Patient's wife updated at bedside  Severity of Illness: The appropriate patient status for this patient is INPATIENT. Inpatient status is judged to be reasonable and necessary in order to provide the required intensity of service to ensure the patient's safety. The patient's presenting symptoms, physical exam findings, and initial radiographic and laboratory data in the context of their chronic comorbidities is felt to place them at high risk for further clinical deterioration. Furthermore, it is not anticipated that the patient will be medically stable for discharge from the hospital within 2 midnights of admission.   * I certify that at the point of admission it is my clinical judgment that the patient will require inpatient hospital care spanning beyond 2 midnights from the point of admission due to high intensity of service, high risk for further deterioration and high frequency of surveillance required.*  Author: Verdene Lennert, MD 06/25/2023 7:50 PM  For on call review [email or web address].

## 2023-06-25 NOTE — Assessment & Plan Note (Signed)
Patient's blood pressure is relatively well-controlled with diet only.  No indication to start antihypertensives at this time.

## 2023-06-25 NOTE — Assessment & Plan Note (Addendum)
Continue bupropion, sertraline, olanzapine

## 2023-06-25 NOTE — Assessment & Plan Note (Addendum)
With gas formation and surrounding cellulitis.  Wound down to the muscle.  Status postdebridement today in the operating room.

## 2023-06-25 NOTE — Consult Note (Signed)
Pharmacy Antibiotic Note  John [name] is a 82 y.o. male admitted on 06/25/2023 with wound infection. PMH significant for HTN, AF, T2DM, HLD, depression. Patient is presenting with a left foot ulcer that has been present for several months, but acutely worsened. He has been previously characterized as a diabetic ulcer. Pharmacy has been consulted for cefepime dosing.  Plan: Day 1 of antibiotics Start cefepime 2 g IV Q8H Patient is also on metronidazole 500 mg IV Q12H Continue to monitor renal function and follow culture results   Height: 6\' 2"  (188 cm) Weight: 79.4 kg (175 lb) IBW/kg (Calculated) : 82.2  Temp (24hrs), Avg:99.1 F (37.3 C), Min:99.1 F (37.3 C), Max:99.1 F (37.3 C)  Recent Labs  Lab 06/25/23 1650 06/25/23 1659  WBC 5.1  --   CREATININE 0.74  --   LATICACIDVEN  --  1.0    Estimated Creatinine Clearance: 82.7 mL/min (by C-G formula based on SCr of 0.74 mg/dL).    No Known Allergies  Antimicrobials this admission: 8/28 Vancomycin x1 8/28 Zosyn x1 8/28 Metronidazole >> 8/29 Cefepime >>   Dose adjustments this admission: N/A  Microbiology results: 8/28 Wound Cx: IP  Thank you for allowing pharmacy to be a part of this patient's care.  Celene Squibb, PharmD Clinical Pharmacist 06/25/2023 8:17 PM

## 2023-06-25 NOTE — Assessment & Plan Note (Signed)
No longer using a CPAP at this time given updated sleep study demonstrated resolution of OSA.

## 2023-06-26 ENCOUNTER — Observation Stay: Payer: Medicare Other

## 2023-06-26 DIAGNOSIS — Z1152 Encounter for screening for COVID-19: Secondary | ICD-10-CM | POA: Diagnosis not present

## 2023-06-26 DIAGNOSIS — E1169 Type 2 diabetes mellitus with other specified complication: Secondary | ICD-10-CM | POA: Diagnosis present

## 2023-06-26 DIAGNOSIS — D696 Thrombocytopenia, unspecified: Secondary | ICD-10-CM | POA: Diagnosis present

## 2023-06-26 DIAGNOSIS — Z8546 Personal history of malignant neoplasm of prostate: Secondary | ICD-10-CM | POA: Diagnosis not present

## 2023-06-26 DIAGNOSIS — N400 Enlarged prostate without lower urinary tract symptoms: Secondary | ICD-10-CM | POA: Diagnosis present

## 2023-06-26 DIAGNOSIS — I10 Essential (primary) hypertension: Secondary | ICD-10-CM | POA: Diagnosis present

## 2023-06-26 DIAGNOSIS — Z8249 Family history of ischemic heart disease and other diseases of the circulatory system: Secondary | ICD-10-CM | POA: Diagnosis not present

## 2023-06-26 DIAGNOSIS — F332 Major depressive disorder, recurrent severe without psychotic features: Secondary | ICD-10-CM | POA: Diagnosis present

## 2023-06-26 DIAGNOSIS — Z7982 Long term (current) use of aspirin: Secondary | ICD-10-CM | POA: Diagnosis not present

## 2023-06-26 DIAGNOSIS — M8600 Acute hematogenous osteomyelitis, unspecified site: Secondary | ICD-10-CM | POA: Diagnosis not present

## 2023-06-26 DIAGNOSIS — Z79899 Other long term (current) drug therapy: Secondary | ICD-10-CM | POA: Diagnosis not present

## 2023-06-26 DIAGNOSIS — I480 Paroxysmal atrial fibrillation: Secondary | ICD-10-CM | POA: Diagnosis present

## 2023-06-26 DIAGNOSIS — H9193 Unspecified hearing loss, bilateral: Secondary | ICD-10-CM | POA: Diagnosis present

## 2023-06-26 DIAGNOSIS — L97509 Non-pressure chronic ulcer of other part of unspecified foot with unspecified severity: Secondary | ICD-10-CM | POA: Diagnosis present

## 2023-06-26 DIAGNOSIS — E785 Hyperlipidemia, unspecified: Secondary | ICD-10-CM | POA: Diagnosis present

## 2023-06-26 DIAGNOSIS — L97525 Non-pressure chronic ulcer of other part of left foot with muscle involvement without evidence of necrosis: Secondary | ICD-10-CM | POA: Diagnosis not present

## 2023-06-26 DIAGNOSIS — M869 Osteomyelitis, unspecified: Secondary | ICD-10-CM | POA: Diagnosis present

## 2023-06-26 DIAGNOSIS — L03032 Cellulitis of left toe: Secondary | ICD-10-CM | POA: Diagnosis present

## 2023-06-26 DIAGNOSIS — B954 Other streptococcus as the cause of diseases classified elsewhere: Secondary | ICD-10-CM | POA: Diagnosis present

## 2023-06-26 DIAGNOSIS — E11628 Type 2 diabetes mellitus with other skin complications: Secondary | ICD-10-CM | POA: Diagnosis present

## 2023-06-26 DIAGNOSIS — L97529 Non-pressure chronic ulcer of other part of left foot with unspecified severity: Secondary | ICD-10-CM | POA: Diagnosis present

## 2023-06-26 DIAGNOSIS — E1140 Type 2 diabetes mellitus with diabetic neuropathy, unspecified: Secondary | ICD-10-CM | POA: Diagnosis present

## 2023-06-26 DIAGNOSIS — Z833 Family history of diabetes mellitus: Secondary | ICD-10-CM | POA: Diagnosis not present

## 2023-06-26 DIAGNOSIS — Z8262 Family history of osteoporosis: Secondary | ICD-10-CM | POA: Diagnosis not present

## 2023-06-26 DIAGNOSIS — L97522 Non-pressure chronic ulcer of other part of left foot with fat layer exposed: Secondary | ICD-10-CM | POA: Diagnosis not present

## 2023-06-26 DIAGNOSIS — M86672 Other chronic osteomyelitis, left ankle and foot: Secondary | ICD-10-CM | POA: Diagnosis present

## 2023-06-26 DIAGNOSIS — E11621 Type 2 diabetes mellitus with foot ulcer: Secondary | ICD-10-CM | POA: Diagnosis present

## 2023-06-26 DIAGNOSIS — Z8042 Family history of malignant neoplasm of prostate: Secondary | ICD-10-CM | POA: Diagnosis not present

## 2023-06-26 DIAGNOSIS — Z96641 Presence of right artificial hip joint: Secondary | ICD-10-CM | POA: Diagnosis present

## 2023-06-26 LAB — BASIC METABOLIC PANEL
Anion gap: 9 (ref 5–15)
BUN: 18 mg/dL (ref 8–23)
CO2: 26 mmol/L (ref 22–32)
Calcium: 8.7 mg/dL — ABNORMAL LOW (ref 8.9–10.3)
Chloride: 105 mmol/L (ref 98–111)
Creatinine, Ser: 0.7 mg/dL (ref 0.61–1.24)
GFR, Estimated: >60 mL/min (ref >60)
Glucose, Bld: 98 mg/dL (ref 70–99)
Potassium: 3.7 mmol/L (ref 3.5–5.1)
Sodium: 140 mmol/L (ref 135–145)

## 2023-06-26 LAB — CBC
HCT: 41.3 % (ref 39.0–52.0)
Hemoglobin: 13.8 g/dL (ref 13.0–17.0)
MCH: 30.9 pg (ref 26.0–34.0)
MCHC: 33.4 g/dL (ref 30.0–36.0)
MCV: 92.4 fL (ref 80.0–100.0)
Platelets: 141 10*3/uL — ABNORMAL LOW (ref 150–400)
RBC: 4.47 MIL/uL (ref 4.22–5.81)
RDW: 13.6 % (ref 11.5–15.5)
WBC: 3.9 10*3/uL — ABNORMAL LOW (ref 4.0–10.5)
nRBC: 0 % (ref 0.0–0.2)

## 2023-06-26 MED ORDER — CHLORHEXIDINE GLUCONATE 4 % EX SOLN
60.0000 mL | Freq: Once | CUTANEOUS | Status: AC
  Administered 2023-06-27: 4 via TOPICAL

## 2023-06-26 MED ORDER — VANCOMYCIN HCL IN DEXTROSE 1-5 GM/200ML-% IV SOLN
1000.0000 mg | Freq: Two times a day (BID) | INTRAVENOUS | Status: DC
  Administered 2023-06-26 – 2023-06-27 (×3): 1000 mg via INTRAVENOUS
  Filled 2023-06-26 (×4): qty 200

## 2023-06-26 NOTE — Progress Notes (Signed)
Progress Note   Patient: John [name] WUJ:[id number] DOB: [birth year] DOA: 06/25/2023     0 DOS: the patient was seen and examined on 06/26/2023   Brief hospital course: 82 y.o. male with medical history significant of type 2 diabetes well-controlled with diet only, BPH, night sweats, severe MDD, who presents to the ED due to a foot ulcer   John [name] states he has been dealing with an ulcer on the bottom of his great toe for nearly 1 year now.  Then in the last 1 week, he developed a fever of 101F and swelling that was spreading up his foot and into his ankle region.  MRI showing large gas-filled plantar and medial ulcerations at the level of the first metatarsal head with surrounding cellulitis, osteomyelitis of the sesamoid bone.  8/29.  Case discussed with podiatry and they will take to the operating room tomorrow.    Assessment and Plan: * Osteomyelitis (HCC) Of the sesamoid bone.  Patient will have removal of the sesamoid bone by podiatry tomorrow.  Patient on Flagyl, vancomycin and cefepime.  Foot ulcer (HCC) With gas formation and surrounding cellulitis.  Patient will be brought to the operating room tomorrow by podiatry for debridement.  Type 2 diabetes mellitus with hyperlipidemia (HCC) Last hemoglobin A1c low at 5.2.  Essential hypertension Patient's blood pressure is relatively well-controlled with diet only.    Severe recurrent major depression without psychotic features (HCC) - Continue home bupropion, sertraline, olanzapine  Apnea, sleep No longer using a CPAP at this time given updated sleep study demonstrated resolution of OSA.  Thrombocytopenia (HCC) Could be secondary to infection.  Check hepatitis C      Subjective: Patient coming in with some foot discomfort and fever.  Found to have a gas-forming infection from his ulcer and osteomyelitis.  Physical Exam: Vitals:   06/25/23 2003 06/25/23 2049 06/26/23 0825 06/26/23 1516  BP: 127/87 (!) 142/83 (!)  158/88 (!) 140/79  Pulse: 64 61 (!) 54 66  Resp: 17 20 14 14   Temp: 98.7 F (37.1 C) 97.6 F (36.4 C) 97.9 F (36.6 C) 97.9 F (36.6 C)  TempSrc: Oral     SpO2: 95% 100% 100% 98%  Weight:      Height:       Physical Exam HENT:     Head: Normocephalic.     Mouth/Throat:     Pharynx: No oropharyngeal exudate.  Eyes:     General: Lids are normal.     Conjunctiva/sclera: Conjunctivae normal.  Cardiovascular:     Rate and Rhythm: Normal rate and regular rhythm.     Heart sounds: Normal heart sounds, S1 normal and S2 normal.  Pulmonary:     Breath sounds: No decreased breath sounds, wheezing, rhonchi or rales.  Abdominal:     Palpations: Abdomen is soft.     Tenderness: There is no abdominal tenderness.  Musculoskeletal:     Right lower leg: No swelling.     Left lower leg: No swelling.  Skin:    General: Skin is warm.     Comments: Ulcer bottom left foot with some drainage.  Neurological:     Mental Status: He is alert and oriented to person, place, and time.     Data Reviewed: MRI reviewed above , Creatinine 0.7, white blood cell count 3.9, hemoglobin 13.8, platelet count 141  Family Communication: Updated patient's wife on the phone  Disposition: Status is: Inpatient Remains inpatient appropriate because: Patient will go to the operating room tomorrow  Planned Discharge Destination: Home    Time spent: 28 minutes Case discussed with podiatry  Author: Alford Highland, MD 06/26/2023 4:36 PM  For on call review [email or web address].

## 2023-06-26 NOTE — Plan of Care (Signed)

## 2023-06-26 NOTE — Assessment & Plan Note (Addendum)
Could be secondary to infection.  Platelet count in low normal range now.  Pending hepatitis C.

## 2023-06-26 NOTE — Hospital Course (Addendum)
82 y.o. male with medical history significant of type 2 diabetes well-controlled with diet only, BPH, night sweats, severe MDD, who presents to the ED due to a foot ulcer   John [name] states he has been dealing with an ulcer on the bottom of his great toe for nearly 1 year now.  Then in the last 1 week, he developed a fever of 101F and swelling that was spreading up his foot and into his ankle region.  MRI showing large gas-filled plantar and medial ulcerations at the level of the first metatarsal head with surrounding cellulitis, osteomyelitis of the sesamoid bone.  8/29.  Case discussed with podiatry and they will take to the operating room tomorrow. 8/30.  Operating room today for removal of sesamoid bone and debridement of foot ulcer. 8/31.  Patient seen by Dr. Ether Griffins podiatry and wound looks stable.  Podiatry okay with discharge home.  Initial culture growing group G strep and diphtheroids.  Operative culture showing no organisms to read and no white blood cells seen.  Dr. Ether Griffins is okay discharging home on oral antibiotics.  Patient switched to Augmentin and discharged home.  Patient complains that he can crack his neck when he turns it.  Could be related to general anesthesia in the position with intubation and in the operating room.  I advised that this was not secondary to the antibiotics.  Patient not having any neck pain.

## 2023-06-26 NOTE — Assessment & Plan Note (Addendum)
Of the sesamoid bone.  Status post removal of sesamoid bone today.  Patient on Flagyl, vancomycin and cefepime.

## 2023-06-26 NOTE — TOC Progression Note (Signed)
Transition of Care [facility]) - Progression Note    Patient Details  Name: John [name] MRN: [id number] Date of Birth: 1941-07-10  Transition of Care [facility]) CM/SW Contact  Marlowe Sax, RN Phone Number: 06/26/2023, 11:16 AM  Clinical Narrative:    Patient from home with his wife, independent at home [facility] to follow and assist with DC planning and needs PCP Dorothey Baseman, MD Cardiologist Iran Ouch, MD   Expected Discharge Plan:  (TBD) Barriers to Discharge: Continued Medical Work up  Expected Discharge Plan and Services   Discharge Planning Services: CM Consult   Living arrangements for the past 2 months: Single Family Home                                       Social Determinants of Health (SDOH) Interventions SDOH Screenings   Food Insecurity: No Food Insecurity (06/25/2023)  Housing: Patient Declined (06/25/2023)  Transportation Needs: No Transportation Needs (06/25/2023)  Utilities: Not At Risk (06/25/2023)  Alcohol Screen: Low Risk  (07/04/2022)  Depression (PHQ2-9): Medium Risk (05/20/2023)  Financial Resource Strain: Patient Declined (10/30/2022)   Received from [facility], [facility]  Tobacco Use: Low Risk  (06/25/2023)    Readmission Risk Interventions     No data to display

## 2023-06-26 NOTE — Consult Note (Signed)
Pharmacy Antibiotic Note  John [name] is a 82 y.o. male admitted on 06/25/2023 with wound infection. PMH significant for HTN, AF, T2DM, HLD, depression. Patient is presenting with a left foot ulcer that has been present for several months, but acutely worsened. He has been previously characterized as a diabetic ulcer. Pharmacy has been consulted for cefepime and vancomycin dosing.  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*  Plan: Day 2 of antibiotics Continue cefepime 2 g IV Q8H Start Vancomycin 1000mg  IV q 12hrs (vanco given 7/28@1957 ) Patient is also on metronidazole 500 mg IV Q12H Continue to monitor renal function and follow culture results   Height: 6\' 2"  (188 cm) Weight: 79.4 kg (175 lb) IBW/kg (Calculated) : 82.2  Temp (24hrs), Avg:98 F (36.7 C), Min:97.6 F (36.4 C), Max:98.7 F (37.1 C)  Recent Labs  Lab 06/25/23 1650 06/25/23 1659 06/26/23 0316  WBC 5.1  --  3.9*  CREATININE 0.74  --  0.70  LATICACIDVEN  --  1.0  --     Estimated Creatinine Clearance: 82.7 mL/min (by C-G formula based on SCr of 0.7 mg/dL).    No Known Allergies  Antimicrobials this admission: 8/28 Vancomycin x1 8/28 Zosyn x1 8/28 Metronidazole >> 8/29 Cefepime >>  8/29 Vancomycin >>  Dose adjustments this admission:N/A  Microbiology results: 8/28 Wound Cx: rare Strep   John [name] PharmD, BCPS 06/26/2023 4:42 PM

## 2023-06-26 NOTE — Consult Note (Signed)
ORTHOPAEDIC CONSULTATION  REQUESTING PHYSICIAN: Alford Highland, MD  Chief Complaint: Left foot infection.    HPI: John [name] is a 82 y.o. male who complains of worsening swelling redness to his left foot.  Had an ulcer this been seen in outpatient clinic.  It probe down to bone.  X-rays and MRI ordered.  MRI was concerning for osteomyelitis.  History of diabetes with neuropathy.  Past Medical History:  Diagnosis Date   Cancer (HCC)    Depression    Depression    Diabetes mellitus without complication (HCC)    type 2   Dissection of carotid artery (HCC)    a. 06/2015 CT Head: abnl thickening of mid-dist R cervical ICA w/o narrowing - ? vasculitis and thrombosed/healing dissection; b. 01/2021 Carotid U/S: 1-39% bilat ICA stenoses.   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 Age 62 - struck in head w/ baseball --> s/p craniotomy.   Epidural hematoma (HCC)    H/O Osgood-Schlatter disease    Hand deformity, congenital    Hearing loss bilateral   Hemorrhoids    Horner syndrome    Hypertension    Migraine    Obesity    PONV (postoperative nausea and vomiting)    Prostate cancer (HCC) 2020   Rosacea    SDH (subdural hematoma) (HCC)    Sleep apnea    Spontaneous Subdural hematoma (HCC)    a. Approx 2015 - eval in [place] - conservatively managed.   Past Surgical History:  Procedure Laterality Date   ANKLE ARTHROSCOPY Left    fracture   CHOLECYSTECTOMY  2007   CRANIOTOMY  1958   left frontal craniotomy for epidural hematoma   HIP ARTHROPLASTY Right 04/07/2022   Procedure: ARTHROPLASTY BIPOLAR HIP (HEMIARTHROPLASTY);  Surgeon: Deeann Saint, MD;  Location: [facility] ORS;  Service: Orthopedics;  Laterality: Right;   INSERTION OF MESH  01/02/2022   Procedure: INSERTION OF MESH;  Surgeon: Carolan Shiver, MD;  Location: [facility] ORS;  Service: General;;   LIGAMENT REPAIR Right    thumb   RETINAL DETACHMENT SURGERY     TREATMENT FISTULA ANAL  2006   Social History    Socioeconomic History   Marital status: Married    Spouse name: Okey Regal   Number of children: 2   Years of education: Not on file   Highest education level: Master's degree (e.g., MA, MS, MEng, MEd, MSW, MBA)  Occupational History   Not on file  Tobacco Use   Smoking status: Never   Smokeless tobacco: Never  Vaping Use   Vaping status: Never Used  Substance and Sexual Activity   Alcohol use: Not Currently   Drug use: No   Sexual activity: Yes    Birth control/protection: None  Other Topics Concern   Not on file  Social History Narrative   Live at home with wife.    Social Determinants of Health   Financial Resource Strain: Patient Declined (10/30/2022)   Received [facility], [facility]   Overall Financial Resource Strain (CARDIA)    Difficulty of Paying Living Expenses: Patient declined  Food Insecurity: No Food Insecurity (06/25/2023)   Hunger Vital Sign    Worried About Running Out of Food in the Last Year: Never true    Ran Out of Food in the Last Year: Never true  Transportation Needs: No Transportation Needs (06/25/2023)   PRAPARE - Administrator, Civil Service (Medical): No  Lack of Transportation (Non-Medical): No  Physical Activity: Not on file  Stress: Not on file  Social Connections: Not on file   Family History  Problem Relation Age of Onset   Osteoporosis Mother    Depression Mother    Prostate cancer Father    Hypertension Father    Diabetes Brother    Kidney disease Neg Hx    No Known Allergies Prior to Admission medications   Medication Sig Start Date End Date Taking? Authorizing Provider  aspirin EC 81 MG tablet Take 81 mg by mouth daily.   Yes [provider]  buPROPion (WELLBUTRIN XL) 300 MG 24 hr tablet Take 1 tablet (300 mg total) by mouth daily. 05/05/23 08/03/23 Yes Hisada, Barbee Cough, MD  docusate sodium (COLACE) 100 MG capsule Take 1 capsule (100 mg total) by mouth 2 (two) times  daily. 07/31/22  Yes Herrick, Richard Edward, DO  OLANZapine (ZYPREXA) 10 MG tablet Take 1 tablet (10 mg total) by mouth at bedtime. 06/02/23 08/31/23 Yes Neysa Hotter, MD  sertraline (ZOLOFT) 100 MG tablet Take 1 tablet (100 mg total) by mouth at bedtime. Total of 150 mg at night. Take along with 50 mg tab 06/25/23 09/23/23 Yes Hisada, Barbee Cough, MD  sertraline (ZOLOFT) 50 MG tablet Take 1 tablet (50 mg total) by mouth at bedtime. Total of 150 mg at night. Take along with 100 mg tab 06/25/23 09/23/23 Yes Hisada, Barbee Cough, MD  tamsulosin (FLOMAX) 0.4 MG CAPS capsule Take 1 capsule (0.4 mg total) by mouth in the morning and at bedtime. 03/21/23  Yes Vaillancourt, Lelon Mast, PA-C  traZODone (DESYREL) 50 MG tablet Take 1 tablet (50 mg total) by mouth at bedtime as needed for sleep. 06/19/23 12/16/23 Yes Hisada, Barbee Cough, MD   US ARTERIAL ABI (SCREENING LOWER EXTREMITY)  Result Date: 06/26/2023 CLINICAL DATA:  82 year old male with a history of foot ulcer EXAM: NONINVASIVE PHYSIOLOGIC VASCULAR STUDY OF BILATERAL LOWER EXTREMITIES TECHNIQUE: Evaluation of both lower extremities was performed at rest, including calculation of ankle-brachial indices, multiple segmental pressure evaluation, segmental Doppler and segmental pulse volume recording. COMPARISON:  None Available. FINDINGS: Right ABI:  1.19 Left ABI:  1.18 Right Lower Extremity: Segmental Doppler at the right ankle demonstrates triphasic waveforms. Left Lower Extremity: Segmental Doppler at the left ankle demonstrates triphasic posterior tibial artery and monophasic dorsalis pedis IMPRESSION: Right: Resting ABI of the right lower extremity within normal limits. Segmental exam at the ankle demonstrates waveforms maintained. Left: Resting ABI of the left lower extremity within normal limits. Segmental exam at the ankle demonstrates waveform of the posterior tibial artery within normal limits, and evidence of developing tibial disease in the distribution of the anterior tibial  artery. Signed, Yvone Neu. Miachel Roux, RPVI [facility] Electronically Signed   By: Gilmer Mor D.O.   On: 06/26/2023 11:39   MR FOOT LEFT W WO CONTRAST  Result Date: 06/25/2023 CLINICAL DATA:  Foot swelling, diabetes. Ulcer at the base of the great toe. EXAM: MRI OF THE LEFT FOREFOOT WITHOUT AND WITH CONTRAST TECHNIQUE: Multiplanar, multisequence MR imaging of the left forefoot was performed both before and after administration of intravenous contrast. CONTRAST:  8mL GADAVIST GADOBUTROL 1 MMOL/ML IV SOLN COMPARISON:  Report from radiographs 08/26/2022 FINDINGS: Despite efforts by the technologist and patient, severe motion artifact is present on today's exam and could not be eliminated. This reduces exam sensitivity and specificity. Bones/Joint/Cartilage Bifid medial sesamoid with abnormal edema signal and enhancement in the medial sesamoid as well  as immediately adjacent large plantar and medial ulcerations the soft tissues, appearance compatible with sesamoiditis and likely osteomyelitis of the medial sesamoid. Small focus of sharply defined low T1 signal in the distal-medial proximal phalanx of the great toe is likely due to arthropathy or geode, and unlikely to be from osteomyelitis. No osteomyelitis of the first metatarsal. No other sites of osteomyelitis in the forefoot. Dorsal arthropathy including spurring and degenerative subcortical cyst formation at the articulation between the lateral cuneiform and the third metatarsal. Ligaments Lisfranc ligament intact. Muscles and Tendons Unremarkable Soft tissues Large gas-filled plantar and medial ulcerations at the level of the first metatarsal head and first MTP joint, adjacent to the inflamed medial sesamoid. Surrounding subcutaneous enhancement and edema compatible with cellulitis which extends into the great toe. IMPRESSION: 1. Large gas-filled plantar and medial ulcerations at the level of  the first metatarsal head and first MTP joint, adjacent to the inflamed medial sesamoid. Surrounding subcutaneous enhancement and edema compatible with cellulitis which extends into the great toe. 2. Bifid medial sesamoid with abnormal edema signal and enhancement, appearance compatible with sesamoiditis and likely osteomyelitis of the medial sesamoid particularly given proximity to the ulcers. 3. Small focus of sharply defined low T1 signal in the distal-medial proximal phalanx of the great toe is likely due to arthropathy or geode, and unlikely to be from osteomyelitis. 4. Dorsal arthropathy at the articulation between the lateral cuneiform and the third metatarsal. Electronically Signed   By: Gaylyn Rong M.D.   On: 06/25/2023 18:30    Positive ROS: All other systems have been reviewed and were otherwise negative with the exception of those mentioned in the HPI and as above.  12 point ROS was performed.  Physical Exam: General: Alert and oriented.  No apparent distress.  Vascular:  Left foot:Dorsalis Pedis:  present Posterior Tibial:  present  Right foot: Dorsalis Pedis:  present Posterior Tibial:  present  Neuro:absent protective sensation  Derm: Plantar left first MTPJ ulceration with wound that probes down to bone.  Mild surrounding erythema.  Ortho/MS: Edema to the first MTPJ diffusely. I personally evaluated the MRI that shows the tibial sesamoid edema with an area of gas to the medial aspect of the first MTPJ. 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  He will need I&D with excision of the sesamoid.  We discussed performing this tomorrow morning.  I discussed with the patient the risk benefits alternatives and complications associated with surgery.  Consent has been given verbally and consent form will  be signed by the patient.  Plan for surgery tomorrow morning.  Orders have been placed.    Irean Hong, DPM Cell [phone]   06/26/2023 12:48 PM

## 2023-06-27 ENCOUNTER — Inpatient Hospital Stay: Payer: Medicare Other

## 2023-06-27 ENCOUNTER — Telehealth: Payer: Self-pay

## 2023-06-27 ENCOUNTER — Inpatient Hospital Stay: Payer: Medicare Other | Admitting: Registered Nurse

## 2023-06-27 ENCOUNTER — Encounter
Admission: EM | Disposition: A | Discharge status: Home Health | Payer: Self-pay | Source: Home / Self Care | Attending: Internal Medicine

## 2023-06-27 ENCOUNTER — Other Ambulatory Visit: Payer: Self-pay

## 2023-06-27 ENCOUNTER — Encounter: Payer: Self-pay | Admitting: Internal Medicine

## 2023-06-27 DIAGNOSIS — L97525 Non-pressure chronic ulcer of other part of left foot with muscle involvement without evidence of necrosis: Secondary | ICD-10-CM | POA: Diagnosis not present

## 2023-06-27 DIAGNOSIS — I10 Essential (primary) hypertension: Secondary | ICD-10-CM | POA: Diagnosis not present

## 2023-06-27 DIAGNOSIS — E1169 Type 2 diabetes mellitus with other specified complication: Secondary | ICD-10-CM | POA: Diagnosis not present

## 2023-06-27 DIAGNOSIS — M8600 Acute hematogenous osteomyelitis, unspecified site: Secondary | ICD-10-CM | POA: Diagnosis not present

## 2023-06-27 HISTORY — PX: METATARSAL HEAD EXCISION: SHX5027

## 2023-06-27 HISTORY — PX: IRRIGATION AND DEBRIDEMENT FOOT: SHX6602

## 2023-06-27 LAB — GLUCOSE, CAPILLARY
Glucose-Capillary: 103 mg/dL — ABNORMAL HIGH (ref 70–99)
Glucose-Capillary: 132 mg/dL — ABNORMAL HIGH (ref 70–99)
Glucose-Capillary: 155 mg/dL — ABNORMAL HIGH (ref 70–99)

## 2023-06-27 LAB — CBC
HCT: 47.2 % (ref 39.0–52.0)
Hemoglobin: 15.6 g/dL (ref 13.0–17.0)
MCH: 30.5 pg (ref 26.0–34.0)
MCHC: 33.1 g/dL (ref 30.0–36.0)
MCV: 92.2 fL (ref 80.0–100.0)
Platelets: 151 10*3/uL (ref 150–400)
RBC: 5.12 MIL/uL (ref 4.22–5.81)
RDW: 13.6 % (ref 11.5–15.5)
WBC: 4.9 10*3/uL (ref 4.0–10.5)
nRBC: 0 % (ref 0.0–0.2)

## 2023-06-27 LAB — BASIC METABOLIC PANEL
Anion gap: 8 (ref 5–15)
BUN: 18 mg/dL (ref 8–23)
CO2: 26 mmol/L (ref 22–32)
Calcium: 8.8 mg/dL — ABNORMAL LOW (ref 8.9–10.3)
Chloride: 105 mmol/L (ref 98–111)
Creatinine, Ser: 0.69 mg/dL (ref 0.61–1.24)
GFR, Estimated: >60 mL/min (ref >60)
Glucose, Bld: 104 mg/dL — ABNORMAL HIGH (ref 70–99)
Potassium: 3.8 mmol/L (ref 3.5–5.1)
Sodium: 139 mmol/L (ref 135–145)

## 2023-06-27 LAB — HEPATITIS C ANTIBODY: HCV Ab: NONREACTIVE

## 2023-06-27 LAB — C-REACTIVE PROTEIN: CRP: 1 mg/dL — ABNORMAL HIGH (ref <1.0)

## 2023-06-27 LAB — SEDIMENTATION RATE: Sed Rate: 9 mm/h (ref 0–20)

## 2023-06-27 SURGERY — IRRIGATION AND DEBRIDEMENT FOOT
Anesthesia: General | Site: Toe | Laterality: Left

## 2023-06-27 MED ORDER — OXYCODONE HCL 5 MG/5ML PO SOLN: 5.0000 mg | Freq: Once | ORAL | Status: DC | PRN

## 2023-06-27 MED ORDER — BUPIVACAINE LIPOSOME 1.3 % IJ SUSP
INTRAMUSCULAR | Status: AC
  Filled 2023-06-27: qty 10

## 2023-06-27 MED ORDER — LIDOCAINE HCL (PF) 2 % IJ SOLN
INTRAMUSCULAR | Status: AC
  Filled 2023-06-27: qty 5

## 2023-06-27 MED ORDER — METHYLENE BLUE (ANTIDOTE) 1 % IV SOLN
INTRAVENOUS | Status: AC
  Filled 2023-06-27: qty 10

## 2023-06-27 MED ORDER — BUPIVACAINE-EPINEPHRINE (PF) 0.25% -1:200000 IJ SOLN
INTRAMUSCULAR | Status: AC
  Filled 2023-06-27: qty 30

## 2023-06-27 MED ORDER — LIDOCAINE-EPINEPHRINE 1 %-1:100000 IJ SOLN
INTRAMUSCULAR | Status: AC
  Filled 2023-06-27: qty 1

## 2023-06-27 MED ORDER — OXYCODONE HCL 5 MG PO TABS: 5.0000 mg | ORAL_TABLET | Freq: Once | ORAL | Status: DC | PRN

## 2023-06-27 MED ORDER — VANCOMYCIN HCL 1000 MG IV SOLR
INTRAVENOUS | Status: AC
  Filled 2023-06-27: qty 20

## 2023-06-27 MED ORDER — DEXAMETHASONE SODIUM PHOSPHATE 10 MG/ML IJ SOLN
INTRAMUSCULAR | Status: AC
  Filled 2023-06-27: qty 1

## 2023-06-27 MED ORDER — PROPOFOL 10 MG/ML IV BOLUS
INTRAVENOUS | Status: AC
  Filled 2023-06-27: qty 20

## 2023-06-27 MED ORDER — LIDOCAINE HCL (PF) 1 % IJ SOLN
INTRAMUSCULAR | Status: AC
  Filled 2023-06-27: qty 30

## 2023-06-27 MED ORDER — FENTANYL CITRATE (PF) 100 MCG/2ML IJ SOLN: 25.0000 ug | INTRAMUSCULAR | Status: DC | PRN

## 2023-06-27 MED ORDER — LACTATED RINGERS IV SOLN: INTRAVENOUS | Status: DC | PRN

## 2023-06-27 MED ORDER — PROPOFOL 10 MG/ML IV BOLUS
INTRAVENOUS | Status: DC | PRN
Start: 2023-06-27 — End: 2023-06-27
  Administered 2023-06-27: 60 mg via INTRAVENOUS

## 2023-06-27 MED ORDER — PHENYLEPHRINE HCL (PRESSORS) 10 MG/ML IV SOLN
INTRAVENOUS | Status: DC | PRN
  Administered 2023-06-27: 80 ug via INTRAVENOUS

## 2023-06-27 MED ORDER — VANCOMYCIN HCL IN DEXTROSE 1-5 GM/200ML-% IV SOLN
INTRAVENOUS | Status: AC
  Filled 2023-06-27: qty 200

## 2023-06-27 MED ORDER — LIDOCAINE HCL (CARDIAC) PF 100 MG/5ML IV SOSY
PREFILLED_SYRINGE | INTRAVENOUS | Status: DC | PRN
  Administered 2023-06-27: 60 mg via INTRAVENOUS

## 2023-06-27 MED ORDER — PHENYLEPHRINE 80 MCG/ML (10ML) SYRINGE FOR IV PUSH (FOR BLOOD PRESSURE SUPPORT)
PREFILLED_SYRINGE | INTRAVENOUS | Status: AC
  Filled 2023-06-27: qty 10

## 2023-06-27 MED ORDER — ONDANSETRON HCL 4 MG/2ML IJ SOLN
INTRAMUSCULAR | Status: AC
  Filled 2023-06-27: qty 2

## 2023-06-27 MED ORDER — EPHEDRINE SULFATE (PRESSORS) 50 MG/ML IJ SOLN
INTRAMUSCULAR | Status: DC | PRN
  Administered 2023-06-27: 5 mg via INTRAVENOUS

## 2023-06-27 MED ORDER — LIDOCAINE-EPINEPHRINE 1 %-1:100000 IJ SOLN
INTRAMUSCULAR | Status: DC | PRN
  Administered 2023-06-27: 10 mL

## 2023-06-27 MED ORDER — FENTANYL CITRATE (PF) 100 MCG/2ML IJ SOLN
INTRAMUSCULAR | Status: DC | PRN
  Administered 2023-06-27: 25 ug via INTRAVENOUS

## 2023-06-27 MED ORDER — FENTANYL CITRATE (PF) 100 MCG/2ML IJ SOLN
INTRAMUSCULAR | Status: AC
  Filled 2023-06-27: qty 2

## 2023-06-27 MED ORDER — DEXAMETHASONE SODIUM PHOSPHATE 10 MG/ML IJ SOLN
INTRAMUSCULAR | Status: DC | PRN
  Administered 2023-06-27: 4 mg via INTRAVENOUS

## 2023-06-27 MED ORDER — ONDANSETRON HCL 4 MG/2ML IJ SOLN
INTRAMUSCULAR | Status: DC | PRN
Start: 2023-06-27 — End: 2023-06-27
  Administered 2023-06-27: 4 mg via INTRAVENOUS

## 2023-06-27 MED ORDER — POVIDONE-IODINE 10 % EX SWAB: 2.0000 | Freq: Once | CUTANEOUS | Status: DC

## 2023-06-27 MED ORDER — BUPIVACAINE HCL 0.5 % IJ SOLN
INTRAMUSCULAR | Status: DC | PRN
  Administered 2023-06-27: 10 mL

## 2023-06-27 MED ORDER — BUPIVACAINE HCL (PF) 0.5 % IJ SOLN
INTRAMUSCULAR | Status: AC
  Filled 2023-06-27: qty 30

## 2023-06-27 SURGICAL SUPPLY — 99 items
BLADE MED AGGRESSIVE (BLADE) ×2 IMPLANT
BLADE OSC/SAGITTAL MD 5.5X18 (BLADE) ×2 IMPLANT
BLADE OSCILLATING/SAGITTAL (BLADE)
BLADE SURG 15 STRL LF DISP TIS (BLADE) ×4 IMPLANT
BLADE SURG 15 STRL SS (BLADE) ×4
BLADE SURG MINI STRL (BLADE) ×2 IMPLANT
BLADE SW THK.38XMED LNG THN (BLADE) IMPLANT
BNDG CMPR 5X4 CHSV STRCH STRL (GAUZE/BANDAGES/DRESSINGS) ×2
BNDG CMPR 5X4 KNIT ELC UNQ LF (GAUZE/BANDAGES/DRESSINGS) ×2
BNDG CMPR 5X6 CHSV STRCH STRL (GAUZE/BANDAGES/DRESSINGS) ×2
BNDG CMPR 75X21 PLY HI ABS (MISCELLANEOUS) ×2
BNDG CMPR STD VLCR NS LF 5.8X4 (GAUZE/BANDAGES/DRESSINGS)
BNDG CMPR STD VLCR NS LF 5.8X6 (GAUZE/BANDAGES/DRESSINGS) ×2
BNDG COHESIVE 4X5 TAN STRL LF (GAUZE/BANDAGES/DRESSINGS) ×2 IMPLANT
BNDG COHESIVE 6X5 TAN ST LF (GAUZE/BANDAGES/DRESSINGS) ×2 IMPLANT
BNDG ELASTIC 4INX 5YD STR LF (GAUZE/BANDAGES/DRESSINGS) ×2 IMPLANT
BNDG ELASTIC 4X5.8 VLCR NS LF (GAUZE/BANDAGES/DRESSINGS) ×2 IMPLANT
BNDG ELASTIC 6X5.8 VLCR NS LF (GAUZE/BANDAGES/DRESSINGS) ×4 IMPLANT
BNDG ESMARCH 4 X 12 STRL LF (GAUZE/BANDAGES/DRESSINGS) ×2
BNDG ESMARCH 4X12 STRL LF (GAUZE/BANDAGES/DRESSINGS) ×2 IMPLANT
BNDG GAUZE DERMACEA FLUFF 4 (GAUZE/BANDAGES/DRESSINGS) ×2 IMPLANT
BNDG GZE 12X3 1 PLY HI ABS (GAUZE/BANDAGES/DRESSINGS) ×2
BNDG GZE DERMACEA 4 6PLY (GAUZE/BANDAGES/DRESSINGS) ×2
BNDG STRETCH GAUZE 3IN X12FT (GAUZE/BANDAGES/DRESSINGS) ×2 IMPLANT
CANISTER WOUND CARE 500ML ATS (WOUND CARE) ×2 IMPLANT
CNTNR URN SCR LID CUP LEK RST (MISCELLANEOUS) IMPLANT
CONT SPEC 4OZ STRL OR WHT (MISCELLANEOUS) ×2
CUFF TOURN SGL QUICK 12 (TOURNIQUET CUFF) IMPLANT
CUFF TOURN SGL QUICK 18X4 (TOURNIQUET CUFF) IMPLANT
DRAPE FLUOR MINI C-ARM 54X84 (DRAPES) ×2 IMPLANT
DRAPE XRAY CASSETTE 23X24 (DRAPES) IMPLANT
DRSG MEPILEX FLEX 3X3 (GAUZE/BANDAGES/DRESSINGS) IMPLANT
DURAPREP 26ML APPLICATOR (WOUND CARE) ×2 IMPLANT
ELECT REM PT RETURN 9FT ADLT (ELECTROSURGICAL) ×2
ELECTRODE REM PT RTRN 9FT ADLT (ELECTROSURGICAL) ×2 IMPLANT
GAUZE PACKING 0.25INX5YD STRL (GAUZE/BANDAGES/DRESSINGS) ×2 IMPLANT
GAUZE SPONGE 4X4 12PLY STRL (GAUZE/BANDAGES/DRESSINGS) ×2 IMPLANT
GAUZE STRETCH 2X75IN STRL (MISCELLANEOUS) ×2 IMPLANT
GAUZE XEROFORM 1X8 LF (GAUZE/BANDAGES/DRESSINGS) ×2 IMPLANT
GLOVE BIO SURGEON STRL SZ7.5 (GLOVE) ×2 IMPLANT
GLOVE INDICATOR 8.0 STRL GRN (GLOVE) ×2 IMPLANT
GOWN STRL REUS W/ TWL LRG LVL3 (GOWN DISPOSABLE) ×2 IMPLANT
GOWN STRL REUS W/ TWL XL LVL3 (GOWN DISPOSABLE) ×4 IMPLANT
GOWN STRL REUS W/TWL LRG LVL3 (GOWN DISPOSABLE) ×2
GOWN STRL REUS W/TWL MED LVL3 (GOWN DISPOSABLE) ×2 IMPLANT
GOWN STRL REUS W/TWL XL LVL3 (GOWN DISPOSABLE) ×4
GOWN STRL REUS W/TWL XL LVL4 (GOWN DISPOSABLE) ×2 IMPLANT
HANDPIECE VERSAJET DEBRIDEMENT (MISCELLANEOUS) IMPLANT
IV NS 1000ML (IV SOLUTION)
IV NS 1000ML BAXH (IV SOLUTION) ×2 IMPLANT
IV NS IRRIG 3000ML ARTHROMATIC (IV SOLUTION) ×2 IMPLANT
KIT DRSG VAC SLVR GRANUFM (MISCELLANEOUS) ×2 IMPLANT
KIT STIMULAN RAPID CURE 5CC (Orthopedic Implant) IMPLANT
KIT TURNOVER KIT A (KITS) ×2 IMPLANT
LABEL OR SOLS (LABEL) ×2 IMPLANT
MANIFOLD NEPTUNE II (INSTRUMENTS) ×2 IMPLANT
NDL FILTER BLUNT 18X1 1/2 (NEEDLE) ×2 IMPLANT
NDL HYPO 22X1.5 SAFETY MO (MISCELLANEOUS) ×2 IMPLANT
NDL HYPO 25X1 1.5 SAFETY (NEEDLE) ×2 IMPLANT
NEEDLE FILTER BLUNT 18X1 1/2 (NEEDLE) ×2 IMPLANT
NEEDLE HYPO 22X1.5 SAFETY MO (MISCELLANEOUS) ×2 IMPLANT
NEEDLE HYPO 25X1 1.5 SAFETY (NEEDLE) ×2 IMPLANT
NS IRRIG 500ML POUR BTL (IV SOLUTION) ×2 IMPLANT
PACK EXTREMITY ARMC (MISCELLANEOUS) ×2 IMPLANT
PACKING GAUZE IODOFORM 1INX5YD (GAUZE/BANDAGES/DRESSINGS) ×2 IMPLANT
PAD ABD DERMACEA PRESS 5X9 (GAUZE/BANDAGES/DRESSINGS) ×2 IMPLANT
PAD CAST 4YDX4 CTTN HI CHSV (CAST SUPPLIES) ×2 IMPLANT
PAD PREP OB/GYN DISP 24X41 (PERSONAL CARE ITEMS) ×2 IMPLANT
PADDING CAST COTTON 4X4 STRL (CAST SUPPLIES)
PIN BALLS 3/8 F/.054-.062 WIRE (MISCELLANEOUS) ×2 IMPLANT
PULSAVAC PLUS IRRIG FAN TIP (DISPOSABLE)
RASP SM TEAR CROSS CUT (RASP) ×2 IMPLANT
SHIELD FULL FACE ANTIFOG 7M (MISCELLANEOUS) ×2 IMPLANT
STOCKINETTE IMPERVIOUS 9X36 MD (GAUZE/BANDAGES/DRESSINGS) ×2 IMPLANT
STOCKINETTE STRL 6IN 960660 (GAUZE/BANDAGES/DRESSINGS) ×2 IMPLANT
STRAP SAFETY 5IN WIDE (MISCELLANEOUS) ×2 IMPLANT
STRIP CLOSURE SKIN 1/4X4 (GAUZE/BANDAGES/DRESSINGS) ×2 IMPLANT
SUT ETHILON 2 0 FS 18 (SUTURE) ×4 IMPLANT
SUT ETHILON 3-0 FS-10 30 BLK (SUTURE) ×2
SUT ETHILON 4-0 (SUTURE)
SUT ETHILON 4-0 FS2 18XMFL BLK (SUTURE)
SUT ETHILON 5-0 FS-2 18 BLK (SUTURE) ×2 IMPLANT
SUT MNCRL 4-0 (SUTURE)
SUT MNCRL 4-0 27XMFL (SUTURE)
SUT VIC AB 3-0 SH 27 (SUTURE) ×2
SUT VIC AB 3-0 SH 27X BRD (SUTURE) ×2 IMPLANT
SUT VIC AB 4-0 FS2 27 (SUTURE) ×2 IMPLANT
SUTURE EHLN 3-0 FS-10 30 BLK (SUTURE) IMPLANT
SUTURE ETHLN 4-0 FS2 18XMF BLK (SUTURE) ×2 IMPLANT
SUTURE MNCRL 4-0 27XMF (SUTURE) ×2 IMPLANT
SWAB CULTURE AMIES ANAERIB BLU (MISCELLANEOUS) IMPLANT
SYR 10ML LL (SYRINGE) ×2 IMPLANT
SYR 3ML LL SCALE MARK (SYRINGE) ×2 IMPLANT
SYR 50ML LL SCALE MARK (SYRINGE) ×2 IMPLANT
TIP FAN IRRIG PULSAVAC PLUS (DISPOSABLE) ×2 IMPLANT
TRAP FLUID SMOKE EVACUATOR (MISCELLANEOUS) ×2 IMPLANT
WATER STERILE IRR 500ML POUR (IV SOLUTION) ×2 IMPLANT
WIRE Z .045 C-WIRE SPADE TIP (WIRE) IMPLANT
WIRE Z .062 C-WIRE SPADE TIP (WIRE) IMPLANT

## 2023-06-27 NOTE — Plan of Care (Signed)
  Problem: Pain Managment: Goal: General experience of comfort will improve Outcome: Progressing   Problem: Safety: Goal: Ability to remain free from injury will improve Outcome: Progressing   Problem: Elimination: Goal: Will not experience complications related to bowel motility Outcome: Progressing   Problem: Activity: Goal: Risk for activity intolerance will decrease Outcome: Progressing   Problem: Nutrition: Goal: Adequate nutrition will be maintained Outcome: Progressing

## 2023-06-27 NOTE — Progress Notes (Signed)
Progress Note   Patient: John [name] ZOX:[id number] DOB: 1940-11-19 DOA: 06/25/2023     1 DOS: the patient was seen and examined on 06/27/2023   Brief hospital course: 82 y.o. male with medical history significant of type 2 diabetes well-controlled with diet only, BPH, night sweats, severe MDD, who presents to the ED due to a foot ulcer   John [name] states he has been dealing with an ulcer on the bottom of his great toe for nearly 1 year now.  Then in the last 1 week, he developed a fever of 101F and swelling that was spreading up his foot and into his ankle region.  MRI showing large gas-filled plantar and medial ulcerations at the level of the first metatarsal head with surrounding cellulitis, osteomyelitis of the sesamoid bone.  8/29.  Case discussed with podiatry and they will take to the operating room tomorrow. 8/30.  Operating room today for removal of sesamoid bone and debridement of foot ulcer.   Assessment and Plan: * Osteomyelitis (HCC) Of the sesamoid bone.  Status post removal of sesamoid bone today.  Patient on Flagyl, vancomycin and cefepime.  Foot ulcer (HCC) With gas formation and surrounding cellulitis.  Wound down to the muscle.  Status postdebridement today in the operating room.  Type 2 diabetes mellitus with hyperlipidemia (HCC) Last hemoglobin A1c low at 5.2.  Essential hypertension Patient's blood pressure is relatively well-controlled with diet only.    Severe recurrent major depression without psychotic features (HCC) Continue bupropion, sertraline, olanzapine  Apnea, sleep No longer using a CPAP at this time given updated sleep study demonstrated resolution of OSA.  Thrombocytopenia (HCC) Could be secondary to infection.  Platelet count in low normal range now.  Pending hepatitis C.      Subjective: Patient seen this morning.  Not having much discomfort in his toe.  Patient seen prior to the operating room.  Admitted with foot ulcer and  osteomyelitis.  Physical Exam: Vitals:   06/27/23 1130 06/27/23 1145 06/27/23 1200 06/27/23 1310  BP: (!) 150/89 (!) 148/79 138/88 120/82  Pulse: 65 62 64 68  Resp: (!) 24 20 (!) 21 20  Temp: 98.3 F (36.8 C)  97.9 F (36.6 C)   TempSrc:      SpO2: 98% 98% 97% 97%  Weight:      Height:       Physical Exam HENT:     Head: Normocephalic.     Mouth/Throat:     Pharynx: No oropharyngeal exudate.  Eyes:     General: Lids are normal.     Conjunctiva/sclera: Conjunctivae normal.  Cardiovascular:     Rate and Rhythm: Normal rate and regular rhythm.     Heart sounds: Normal heart sounds, S1 normal and S2 normal.  Pulmonary:     Breath sounds: No decreased breath sounds, wheezing, rhonchi or rales.  Abdominal:     Palpations: Abdomen is soft.     Tenderness: There is no abdominal tenderness.  Musculoskeletal:     Right lower leg: No swelling.     Left lower leg: No swelling.  Skin:    General: Skin is warm.     Comments: Left foot wrapped this morning.  Neurological:     Mental Status: He is alert and oriented to person, place, and time.     Data Reviewed: Sedimentation rate 9, creatinine 0.69, hemoglobin 15.6 Preop EKG reviewed by me with no acute changes.  Family Communication: Left message for wife  Disposition: Status is: Inpatient  Remains inpatient appropriate because: Operating room today for sesamoid osteomyelitis and foot ulcer.  Planned Discharge Destination: Home    Time spent: 27 minutes  Author: Alford Highland, MD 06/27/2023 1:24 PM  For on call review [email or web address].

## 2023-06-27 NOTE — Transfer of Care (Signed)
Immediate Anesthesia Transfer of Care Note  Patient: John [name]  Procedure(s) Performed: IRRIGATION AND DEBRIDEMENT FOOT (Left) EXCISION OF BONE IN GREAT TOE JOINT (Left: Toe)  Patient Location: PACU  Anesthesia Type:General  Level of Consciousness: awake, alert , and oriented  Airway & Oxygen Therapy: Patient Spontanous Breathing and Patient connected to face mask oxygen  Post-op Assessment: Report given to RN and Post -op Vital signs reviewed and stable  Post vital signs: stable  Last Vitals:  Vitals Value Taken Time  BP 138/98 06/27/23 1112  Temp    Pulse 66 06/27/23 1114  Resp 11 06/27/23 1114  SpO2 100 % 06/27/23 1114  Vitals shown include unfiled device data.  Last Pain:  Vitals:   06/27/23 0844  TempSrc:   PainSc: 0-No pain         Complications: No notable events documented.

## 2023-06-27 NOTE — Telephone Encounter (Signed)
pt wife called state that he needs a refill on the sertraline. pt was last seen on 7-23 and next appt 9-17

## 2023-06-27 NOTE — Telephone Encounter (Signed)
left message notifying that rx for 50mg  was ready for pick up and the 100mg  was too soon to refill.

## 2023-06-27 NOTE — Anesthesia Preprocedure Evaluation (Signed)
Anesthesia Evaluation  Patient identified by MRN, date of birth, ID band Patient awake    Reviewed: Allergy & Precautions, NPO status , Patient's Chart, lab work & pertinent test results  History of Anesthesia Complications (+) history of anesthetic complications  Airway Mallampati: III  TM Distance: >3 FB Neck ROM: full    Dental  (+) Chipped, Dental Advidsory Given   Pulmonary neg pulmonary ROS   Pulmonary exam normal        Cardiovascular hypertension, negative cardio ROS Normal cardiovascular exam     Neuro/Psych  PSYCHIATRIC DISORDERS  Depression    negative neurological ROS     GI/Hepatic negative GI ROS, Neg liver ROS,,,  Endo/Other  negative endocrine ROSdiabetes    Renal/GU      Musculoskeletal   Abdominal   Peds  Hematology negative hematology ROS (+)   Anesthesia Other Findings Past Medical History: No date: Cancer (HCC) No date: Depression No date: Depression No date: Diabetes mellitus without complication (HCC)     Comment:  type 2 No date: Dissection of carotid artery (HCC)     Comment:  a. 06/2015 CT Head: abnl thickening of mid-dist R               cervical ICA w/o narrowing - ? vasculitis and               thrombosed/healing dissection; b. 01/2021 Carotid U/S:               1-39% bilat ICA stenoses. No date: Eczema No date: Elevated PSA No date: Epidural hematoma (HCC)     Comment:  a. Age 23 - struck in head w/ baseball --> s/p               craniotomy. No date: Epidural hematoma (HCC) No date: 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, congenital bilateral: Hearing loss No date: Hemorrhoids 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 No date: Hypertension No date: Migraine No date: Obesity No date: PONV (postoperative nausea and vomiting) 2020: Prostate cancer (HCC) No date: Rosacea No date: SDH (subdural hematoma) (HCC) No date: Sleep apnea No date: Spontaneous Subdural hematoma  (HCC)     Comment:  a. Approx 2015 - eval in [place] - conservatively managed.  Past Surgical History: No date: ANKLE ARTHROSCOPY; Left     Comment:  fracture 2007: CHOLECYSTECTOMY 1958: CRANIOTOMY     Comment:  left frontal craniotomy for epidural hematoma 04/07/2022: HIP ARTHROPLASTY; Right     Comment:  Procedure: ARTHROPLASTY BIPOLAR HIP (HEMIARTHROPLASTY);               Surgeon: Deeann Saint, MD;  Location: [facility] ORS;                Service: Orthopedics;  Laterality: Right; 01/02/2022: INSERTION OF MESH     Comment:  Procedure: INSERTION OF MESH;  Surgeon: Carolan Shiver, MD;  Location: [facility] ORS;  Service: General;; No date: LIGAMENT REPAIR; Right     Comment:  thumb No date: RETINAL DETACHMENT SURGERY 2006: TREATMENT FISTULA ANAL  BMI    Body Mass Index: 22.47 kg/m      Reproductive/Obstetrics negative OB ROS                             Anesthesia Physical Anesthesia Plan  ASA: 3  Anesthesia Plan: General   Post-op Pain Management:  Induction: Intravenous  PONV Risk Score and Plan: 3 and Propofol infusion, TIVA and Ondansetron  Airway Management Planned: LMA  Additional Equipment:   Intra-op Plan:   Post-operative Plan:   Informed Consent: I have reviewed the patients History and Physical, chart, labs and discussed the procedure including the risks, benefits and alternatives for the proposed anesthesia with the patient or authorized representative who has indicated his/her understanding and acceptance.     Dental Advisory Given  Plan Discussed with: CRNA and Surgeon  Anesthesia Plan Comments: (Patient consented for risks of anesthesia including but not limited to:  - adverse reactions to medications - risk of airway placement if required - damage to eyes, teeth, lips or other oral mucosa - nerve damage due to positioning  - sore throat or hoarseness - Damage to heart, brain, nerves, lungs, other parts of  body or loss of life  Patient voiced understanding.)       Anesthesia Quick Evaluation

## 2023-06-27 NOTE — Progress Notes (Signed)
7829 EKG done  0834 Pt en route to same day surgery

## 2023-06-27 NOTE — Telephone Encounter (Signed)
according to the pharmaist she states that the 50mg  is ready for pick up and the 100mg  is too soon to pick up.

## 2023-06-27 NOTE — Op Note (Signed)
Operative note   Surgeon:Jaquawn Saffran Armed forces logistics/support/administrative officer: None    Preop diagnosis: 1.  Full-thickness ulcer plantar left first MTPJ 2.  Osteomyelitis tibial sesamoid left first MTPJ    Postop diagnosis: Same    Procedure: 1.  Excision tibial sesamoid plantar left first MTPJ 2.  Full-thickness excisional debridement to muscle plantar left first MTPJ 3.  Implantation of antibiotic impregnated beads left foot 4.  Intraoperative fluoroscopy use without assistance of radiologist    EBL: Minimal    Anesthesia:local and general.  Local consisted of a one-to-one mixture of 1% lidocaine plain and 0.5% bupivacaine and a local block fashion along the incision site    Hemostasis: Lidocaine with epinephrine    Specimen: Deep wound culture and bone for pathology    Complications: None    Operative indications:John [name] is an 82 y.o. that presents today for surgical intervention.  The risks/benefits/alternatives/complications have been discussed and consent has been given.    Procedure:  Patient was brought into the OR and placed on the operating table in thesupine position. After anesthesia was obtained theleft lower extremity was prepped and draped in usual sterile fashion.  Attention was directed to the medial aspect of the left first MTPJ where a medial incision was performed.  Sharp and blunt dissection carried down to the capsule.  The medial capsule of the MTPJ was entered.  There was noted to be a small amount of gas within this area.  The wound was then flushed with copious amounts of irrigation.  No severe purulence was noted.  No severe necrotic tissue was noted.  At this time the tibial sesamoid was noted and removed from the surgical field in toto.  This was a bipartite tibial sesamoid.  Intraoperative fluoroscopy did confirm removal of the tibial sesamoid with the fibular sesamoid left intact.  Inspection of joint revealed no erosive change or gas or infection within the joint itself.  No  further necrotic tissue was noted.  At this time attention was directed to the plantar aspect of the first MTPJ where the small ulceration was noted.  This was a full-thickness ulcer down to the level of muscle and capsule.  Full-thickness excisional debridement was performed with a 15 blade.  Predebridement measurements were 3 x 3 x 10 mm with postdebridement measurements at 4 x 4 x 10 mm.  Deep fibrotic nonviable soft tissue was excised.  Finally the wound was flushed 1 final time and antibiotic vancomycin impregnated stimulant beads were then placed within the wound site.  Intraoperative fluoroscopy revealed the implantation of the beads.  At this time wound closure was performed with a 3-0 Vicryl for the capsule and 3-0 nylon for the skin.  The plantar ulceration was packed open with Nu Gauze quarter-inch packing.  A large bulky sterile dressing was applied.    Patient tolerated the procedure and anesthesia well.  Was transported from the OR to the PACU with all vital signs stable and vascular status intact. To be discharged per routine protocol.  Will follow up in approximately 1 week in the outpatient clinic.

## 2023-06-27 NOTE — Anesthesia Postprocedure Evaluation (Signed)
Anesthesia Post Note  Patient: John [name]  Procedure(s) Performed: IRRIGATION AND DEBRIDEMENT FOOT (Left) EXCISION OF BONE IN GREAT TOE JOINT (Left: Toe)  Patient location during evaluation: PACU Anesthesia Type: General Level of consciousness: awake and alert Pain management: pain level controlled Vital Signs Assessment: post-procedure vital signs reviewed and stable Respiratory status: spontaneous breathing, nonlabored ventilation, respiratory function stable and patient connected to nasal cannula oxygen Cardiovascular status: blood pressure returned to baseline and stable Postop Assessment: no apparent nausea or vomiting Anesthetic complications: no  No notable events documented.   Last Vitals:  Vitals:   06/27/23 1130 06/27/23 1145  BP: (!) 150/89 (!) 148/79  Pulse: 65 62  Resp: (!) 24 20  Temp: 36.8 C   SpO2: 98% 98%    Last Pain:  Vitals:   06/27/23 1145  TempSrc:   PainSc: 0-No pain                 [name]

## 2023-06-27 NOTE — Consult Note (Addendum)
Pharmacy Antibiotic Note  Krista Giancarlo is a 82 y.o. male admitted on 06/25/2023 with wound infection. PMH significant for HTN, AF, T2DM, HLD, depression. Patient is presenting with a left foot ulcer that has been present for several months, but acutely worsened. He has been previously characterized as a diabetic ulcer. Pharmacy has been consulted for cefepime and vancomycin dosing.  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*  Plan: Day 3 of antibiotics Continue cefepime 2 g IV Q8H Continue Vancomycin 1000mg  IV q 12hrs Patient is also on metronidazole 500 mg IV Q12H Continue to monitor renal function and follow culture results   Height: 6\' 2"  (188 cm) Weight: 79.4 kg (175 lb) IBW/kg (Calculated) : 82.2  Temp (24hrs), Avg:97.9 F (36.6 C), Min:97.7 F (36.5 C), Max:98.3 F (36.8 C)  Recent Labs  Lab 06/25/23 1650 06/25/23 1659 06/26/23 0316 06/27/23 0629  WBC 5.1  --  3.9* 4.9  CREATININE 0.74  --  0.70 0.69  LATICACIDVEN  --  1.0  --   --     Estimated Creatinine Clearance: 82.7 mL/min (by C-G formula based on SCr of 0.69 mg/dL).    No Known Allergies  Antimicrobials this admission: 8/28 Vancomycin x1 8/28 Zosyn x1 8/28 Metronidazole >> 8/29 Cefepime >>  8/29 Vancomycin >>  Dose adjustments this admission:N/A  Microbiology results: 8/28 Wound Cx: rare Strep   Bettey Costa, PharmD Clinical Pharmacist 06/27/2023 12:49 PM

## 2023-06-27 NOTE — Telephone Encounter (Signed)
Please contact the pharmacy to verify-  the order for both the 100 mg and 50 mg doses was sent on 8/28 for a 90-day supply.

## 2023-06-28 ENCOUNTER — Encounter: Payer: Self-pay | Admitting: Podiatry

## 2023-06-28 DIAGNOSIS — M8600 Acute hematogenous osteomyelitis, unspecified site: Secondary | ICD-10-CM | POA: Diagnosis not present

## 2023-06-28 DIAGNOSIS — L97525 Non-pressure chronic ulcer of other part of left foot with muscle involvement without evidence of necrosis: Secondary | ICD-10-CM | POA: Diagnosis not present

## 2023-06-28 DIAGNOSIS — I10 Essential (primary) hypertension: Secondary | ICD-10-CM | POA: Diagnosis not present

## 2023-06-28 DIAGNOSIS — E1169 Type 2 diabetes mellitus with other specified complication: Secondary | ICD-10-CM | POA: Diagnosis not present

## 2023-06-28 LAB — BASIC METABOLIC PANEL
Anion gap: 8 (ref 5–15)
BUN: 16 mg/dL (ref 8–23)
CO2: 24 mmol/L (ref 22–32)
Calcium: 8.7 mg/dL — ABNORMAL LOW (ref 8.9–10.3)
Chloride: 106 mmol/L (ref 98–111)
Creatinine, Ser: 0.72 mg/dL (ref 0.61–1.24)
GFR, Estimated: >60 mL/min (ref >60)
Glucose, Bld: 98 mg/dL (ref 70–99)
Potassium: 3.7 mmol/L (ref 3.5–5.1)
Sodium: 138 mmol/L (ref 135–145)

## 2023-06-28 LAB — CBC
HCT: 47.7 % (ref 39.0–52.0)
Hemoglobin: 15.8 g/dL (ref 13.0–17.0)
MCH: 30.7 pg (ref 26.0–34.0)
MCHC: 33.1 g/dL (ref 30.0–36.0)
MCV: 92.6 fL (ref 80.0–100.0)
Platelets: 151 10*3/uL (ref 150–400)
RBC: 5.15 MIL/uL (ref 4.22–5.81)
RDW: 13.2 % (ref 11.5–15.5)
WBC: 6.8 10*3/uL (ref 4.0–10.5)
nRBC: 0 % (ref 0.0–0.2)

## 2023-06-28 MED ORDER — ACETAMINOPHEN 325 MG PO TABS: 650.0000 mg | ORAL_TABLET | Freq: Four times a day (QID) | ORAL | Status: AC | PRN

## 2023-06-28 MED ORDER — POLYETHYLENE GLYCOL 3350 17 G PO PACK: 17.0000 g | PACK | Freq: Every day | ORAL | 0 refills | Status: DC | PRN

## 2023-06-28 MED ORDER — SODIUM CHLORIDE 0.9 % IV SOLN
3.0000 g | Freq: Four times a day (QID) | INTRAVENOUS | Status: DC
  Administered 2023-06-28: 3 g via INTRAVENOUS
  Filled 2023-06-28 (×2): qty 8

## 2023-06-28 MED ORDER — AMOXICILLIN-POT CLAVULANATE 875-125 MG PO TABS: 1.0000 | ORAL_TABLET | Freq: Two times a day (BID) | ORAL | 0 refills | Status: AC

## 2023-06-28 NOTE — TOC Progression Note (Signed)
Transition of Care [facility]) - Progression Note    Patient Details  Name: John [name] MRN: [id number] Date of Birth: 1941/09/16  [facility] CM/SW Contact  Hetty Ely, RN Phone Number: 06/28/2023, 1:34 PM  Clinical Narrative: CM spoke with wife about HHPT recommendation, she's receptive and reports that they are currently open with [facility], and would like to resume. CM sent text message to Elnita Maxwell, Nurse Consultant to resume [facility] services.       Expected Discharge Plan:  (TBD) Barriers to Discharge: Continued Medical Work up  Expected Discharge Plan and Services   Discharge Planning Services: CM Consult   Living arrangements for the past 2 months: Single Family Home                                       Social Determinants of Health (SDOH) Interventions SDOH Screenings   Food Insecurity: No Food Insecurity (06/25/2023)  Housing: Patient Declined (06/25/2023)  Transportation Needs: No Transportation Needs (06/25/2023)  Utilities: Not At Risk (06/25/2023)  Alcohol Screen: Low Risk  (07/04/2022)  Depression (PHQ2-9): Medium Risk (05/20/2023)  Financial Resource Strain: Patient Declined (10/30/2022)   Received from [facility], [facility]  Tobacco Use: Low Risk  (06/27/2023)    Readmission Risk Interventions     No data to display

## 2023-06-28 NOTE — Discharge Instructions (Signed)
Dressing changes: Change dressing daily while drainage is noted on dressing.  Remove padded gauze dressing.  Cleanse surrounding tissue with soap and water.  Apply padded gauze dressing consisting of 4 x 4's ABD and Kerlix wrap.  Secure with Ace wrap.  Continue with weightbearing on heel only with walker.  Keep foot elevated when nonambulatory.  Follow-up with podiatry in 2 weeks.

## 2023-06-28 NOTE — Discharge Summary (Signed)
Physician Discharge Summary   Patient: John [name] MRN: [id number] DOB: [birth year]  Admit date:     06/25/2023  Discharge date: 06/28/23  Discharge Physician: John [name]   PCP: John Baseman, MD   Recommendations at discharge:   Follow-up PCP 5 days Follow-up podiatry 2 weeks  Discharge Diagnoses: Principal Problem:   Osteomyelitis (HCC) Active Problems:   Foot ulcer (HCC)   Type 2 diabetes mellitus with hyperlipidemia (HCC)   Essential hypertension   Severe recurrent major depression without psychotic features (HCC)   Apnea, sleep   Thrombocytopenia [facility])    Hospital Course: 82 y.o. male with medical history significant of type 2 diabetes well-controlled with diet only, BPH, night sweats, severe MDD, who presents to the ED due to a foot ulcer   John [name] states he has been dealing with an ulcer on the bottom of his great toe for nearly 1 year now.  Then in the last 1 week, he developed a fever of 101F and swelling that was spreading up his foot and into his ankle region.  MRI showing large gas-filled plantar and medial ulcerations at the level of the first metatarsal head with surrounding cellulitis, osteomyelitis of the sesamoid bone.  8/29.  Case discussed with podiatry and they will take to the operating room tomorrow. 8/30.  Operating room today for removal of sesamoid bone and debridement of foot ulcer. 8/31.  Patient seen by Dr. Ether [name] podiatry and wound looks stable.  Podiatry okay with discharge home.  Initial culture growing group G strep and diphtheroids.  Operative culture showing no organisms to read and no white blood cells seen.  Dr. Ether [name] is okay discharging home on oral antibiotics.  Patient switched to Augmentin and discharged home.  Patient complains that he can crack his neck when he turns it.  Could be related to general anesthesia in the position with intubation and in the operating room.  I advised that this was not secondary to the antibiotics.   Patient not having any neck pain.  Assessment and Plan: * Osteomyelitis (HCC) Of the sesamoid bone.  Status post removal of sesamoid bone on 8/30.  Triple antibiotic switched over to Unasyn today.  With podiatry given the clearance to go home antibiotics switched over to Augmentin for discharge.  Foot ulcer (HCC) With gas formation and surrounding cellulitis.  Wound down to the muscle.  Status postdebridement 8/30 in the operating room.  Initial culture growing group G strep and diphtheroids.  Operative culture no organisms seen and no white blood cells seen.  Antibiotics switched over to Augmentin for discharge.  Type 2 diabetes mellitus with hyperlipidemia (HCC) Last hemoglobin A1c low at 5.2.  Essential hypertension Patient's blood pressure is relatively well-controlled with diet only.    Severe recurrent major depression without psychotic features (HCC) Continue bupropion, sertraline, olanzapine  Apnea, sleep No longer using a CPAP at this time given updated sleep study demonstrated resolution of OSA.  Thrombocytopenia (HCC) Could be secondary to infection.  Platelet count in low normal range now.  Hepatitis C is nonreactive (negative).         Consultants: Podiatry Procedures performed: Removal of sesamoid bone under left first toe and debridement of left first toe wound. Disposition: Home health Diet recommendation:  Cardiac diet DISCHARGE MEDICATION: Allergies as of 06/28/2023   No Known Allergies      Medication List     TAKE these medications    acetaminophen 325 MG tablet Commonly known as: TYLENOL Take 2 tablets (650  mg total) by mouth every 6 (six) hours as needed for mild pain (or Fever >/= 101).   amoxicillin-clavulanate 875-125 MG tablet Commonly known as: AUGMENTIN Take 1 tablet by mouth 2 (two) times daily for 12 days.   aspirin EC 81 MG tablet Take 81 mg by mouth daily.   buPROPion 300 MG 24 hr tablet Commonly known as: WELLBUTRIN XL Take 1  tablet (300 mg total) by mouth daily.   docusate sodium 100 MG capsule Commonly known as: COLACE Take 1 capsule (100 mg total) by mouth 2 (two) times daily.   OLANZapine 10 MG tablet Commonly known as: ZYPREXA Take 1 tablet (10 mg total) by mouth at bedtime.   polyethylene glycol 17 g packet Commonly known as: MIRALAX / GLYCOLAX Take 17 g by mouth daily as needed for moderate constipation.   sertraline 100 MG tablet Commonly known as: ZOLOFT Take 1 tablet (100 mg total) by mouth at bedtime. Total of 150 mg at night. Take along with 50 mg tab   sertraline 50 MG tablet Commonly known as: ZOLOFT Take 1 tablet (50 mg total) by mouth at bedtime. Total of 150 mg at night. Take along with 100 mg tab   tamsulosin 0.4 MG Caps capsule Commonly known as: FLOMAX Take 1 capsule (0.4 mg total) by mouth in the morning and at bedtime.   traZODone 50 MG tablet Commonly known as: DESYREL Take 1 tablet (50 mg total) by mouth at bedtime as needed for sleep.        Follow-up Information     John [name], DPM Follow up in 2 week(s).   Specialty: Podiatry Contact information: [address] [place] [address] [phone]         John Baseman, MD Follow up in 5 day(s).   Specialty: Family Medicine Contact information: [address] [place] [address] [phone]                Discharge Exam: Filed Weights   06/25/23 1554  Weight: 79.4 kg   Physical Exam HENT:     Head: Normocephalic.     Mouth/Throat:     Pharynx: No oropharyngeal exudate.  Eyes:     General: Lids are normal.     Conjunctiva/sclera: Conjunctivae normal.  Cardiovascular:     Rate and Rhythm: Normal rate and regular rhythm.     Heart sounds: Normal heart sounds, S1 normal and S2 normal.  Pulmonary:     Breath sounds: No decreased breath sounds, wheezing, rhonchi or rales.  Abdominal:     Palpations: Abdomen is soft.     Tenderness: There is no abdominal tenderness.   Musculoskeletal:     Right lower leg: No swelling.     Left lower leg: No swelling.  Skin:    General: Skin is warm.     Comments: Left foot wrapped this morning.  Neurological:     Mental Status: He is alert and oriented to person, place, and time.      Condition at discharge: stable  The results of significant diagnostics from this hospitalization (including imaging, microbiology, ancillary and laboratory) are listed below for reference.   Imaging Studies: DG MINI C-ARM IMAGE ONLY  Result Date: 06/27/2023 There is no interpretation for this exam.  This order is for images obtained during a surgical procedure.  Please See "Surgeries" Tab for more information regarding the procedure.   US ARTERIAL ABI (SCREENING LOWER EXTREMITY)  Result Date: 06/26/2023 CLINICAL DATA:  83 year old male with  a history of foot ulcer EXAM: NONINVASIVE PHYSIOLOGIC VASCULAR STUDY OF BILATERAL LOWER EXTREMITIES TECHNIQUE: Evaluation of both lower extremities was performed at rest, including calculation of ankle-brachial indices, multiple segmental pressure evaluation, segmental Doppler and segmental pulse volume recording. COMPARISON:  None Available. FINDINGS: Right ABI:  1.19 Left ABI:  1.18 Right Lower Extremity: Segmental Doppler at the right ankle demonstrates triphasic waveforms. Left Lower Extremity: Segmental Doppler at the left ankle demonstrates triphasic posterior tibial artery and monophasic dorsalis pedis IMPRESSION: Right: Resting ABI of the right lower extremity within normal limits. Segmental exam at the ankle demonstrates waveforms maintained. Left: Resting ABI of the left lower extremity within normal limits. Segmental exam at the ankle demonstrates waveform of the posterior tibial artery within normal limits, and evidence of developing tibial disease in the distribution of the anterior tibial artery. Signed, Yvone Neu. Miachel Roux, RPVI [facility]  [facility] Electronically Signed   By: Gilmer Mor D.O.   On: 06/26/2023 11:39   MR FOOT LEFT W WO CONTRAST  Result Date: 06/25/2023 CLINICAL DATA:  Foot swelling, diabetes. Ulcer at the base of the great toe. EXAM: MRI OF THE LEFT FOREFOOT WITHOUT AND WITH CONTRAST TECHNIQUE: Multiplanar, multisequence MR imaging of the left forefoot was performed both before and after administration of intravenous contrast. CONTRAST:  8mL GADAVIST GADOBUTROL 1 MMOL/ML IV SOLN COMPARISON:  Report from radiographs 08/26/2022 FINDINGS: Despite efforts by the technologist and patient, severe motion artifact is present on today's exam and could not be eliminated. This reduces exam sensitivity and specificity. Bones/Joint/Cartilage Bifid medial sesamoid with abnormal edema signal and enhancement in the medial sesamoid as well as immediately adjacent large plantar and medial ulcerations the soft tissues, appearance compatible with sesamoiditis and likely osteomyelitis of the medial sesamoid. Small focus of sharply defined low T1 signal in the distal-medial proximal phalanx of the great toe is likely due to arthropathy or geode, and unlikely to be from osteomyelitis. No osteomyelitis of the first metatarsal. No other sites of osteomyelitis in the forefoot. Dorsal arthropathy including spurring and degenerative subcortical cyst formation at the articulation between the lateral cuneiform and the third metatarsal. Ligaments Lisfranc ligament intact. Muscles and Tendons Unremarkable Soft tissues Large gas-filled plantar and medial ulcerations at the level of the first metatarsal head and first MTP joint, adjacent to the inflamed medial sesamoid. Surrounding subcutaneous enhancement and edema compatible with cellulitis which extends into the great toe. IMPRESSION: 1. Large gas-filled plantar and medial ulcerations at the level of the first metatarsal head and first MTP joint, adjacent to the inflamed medial sesamoid.  Surrounding subcutaneous enhancement and edema compatible with cellulitis which extends into the great toe. 2. Bifid medial sesamoid with abnormal edema signal and enhancement, appearance compatible with sesamoiditis and likely osteomyelitis of the medial sesamoid particularly given proximity to the ulcers. 3. Small focus of sharply defined low T1 signal in the distal-medial proximal phalanx of the great toe is likely due to arthropathy or geode, and unlikely to be from osteomyelitis. 4. Dorsal arthropathy at the articulation between the lateral cuneiform and the third metatarsal. Electronically Signed   By: Gaylyn Rong M.D.   On: 06/25/2023 18:30    Microbiology: Results for orders placed or performed during the hospital encounter of 06/25/23  Aerobic/Anaerobic Culture w Gram Stain (surgical/deep wound)     Status: None (Preliminary result)   Collection Time: 06/25/23  4:19 PM   Specimen: Wound  Result Value Ref Range Status   Specimen Description  Final    WOUND Performed at [facility], [address], [place], [place] [address]    Special Requests   Final    LT FOOT ULCER Performed at [facility], [address] [place], [place] [address]    Gram Stain   Final    NO WBC SEEN NO ORGANISMS SEEN Performed at [facility], [address], [place], [place] [address]    Culture   Final    RARE STREPTOCOCCUS GROUP G Beta hemolytic streptococci are predictably susceptible to penicillin and other beta lactams. Susceptibility testing not routinely performed. RARE DIPHTHEROIDS(CORYNEBACTERIUM SPECIES) Standardized susceptibility testing for this organism is not available. NO ANAEROBES ISOLATED; CULTURE IN PROGRESS FOR 5 DAYS    Report Status PENDING  Incomplete  Blood culture (single)     Status: None (Preliminary result)   Collection Time: 06/25/23  4:50 PM   Specimen: BLOOD  Result Value Ref Range Status   Specimen Description BLOOD BLOOD LEFT FOREARM   Final   Special Requests   Final    BOTTLES DRAWN AEROBIC AND ANAEROBIC Blood Culture adequate volume   Culture   Final    NO GROWTH 3 DAYS Performed at [facility], [address], [place], [place] [address]    Report Status PENDING  Incomplete  SARS Coronavirus 2 by RT PCR (hospital order, performed in [facility] hospital lab) *cepheid single result test* Anterior Nasal Swab     Status: None   Collection Time: 06/25/23  4:59 PM   Specimen: Anterior Nasal Swab  Result Value Ref Range Status   SARS Coronavirus 2 by RT PCR NEGATIVE NEGATIVE Final    Comment: (NOTE) SARS-CoV-2 target nucleic acids are NOT DETECTED.  The SARS-CoV-2 RNA is generally detectable in upper and lower respiratory specimens during the acute phase of infection. The lowest concentration of SARS-CoV-2 viral copies this assay can detect is 250 copies / mL. A negative result does not preclude SARS-CoV-2 infection and should not be used as the sole basis for treatment or other patient management decisions.  A negative result may occur with improper specimen collection / handling, submission of specimen other than nasopharyngeal swab, presence of viral mutation(s) within the areas targeted by this assay, and inadequate number of viral copies (<250 copies / mL). A negative result must be combined with clinical observations, patient history, and epidemiological information.  Fact Sheet for Patients:   RoadLapTop.co.za  Fact Sheet for Healthcare Providers: http://kim-miller.com/  This test is not yet approved or  cleared by the [place] FDA and has been authorized for detection and/or diagnosis of SARS-CoV-2 by FDA under an Emergency Use Authorization (EUA).  This EUA will remain in effect (meaning this test can be used) for the duration of the COVID-19 declaration under Section 564(b)(1) of the Act, 21 U.S.C. section 360bbb-3(b)(1), unless the  authorization is terminated or revoked sooner.  Performed at [facility], [address] [place], [place] [address]   Aerobic/Anaerobic Culture w Gram Stain (surgical/deep wound)     Status: None (Preliminary result)   Collection Time: 06/27/23 10:30 AM   Specimen: Wound; Tissue  Result Value Ref Range Status   Specimen Description   Final    TOE Performed at [facility], [address] [place], [place] [address]    Special Requests LEFT GREAT TOE  Final   Gram Stain NO WBC SEEN NO ORGANISMS SEEN   Final   Culture   Final  TOO YOUNG TO READ Performed at [facility], [address] [place], [place] [address]    Report Status PENDING  Incomplete    Labs: CBC: Recent Labs  Lab 06/25/23 1650 06/26/23 0316 06/27/23 0629 06/28/23 0456  WBC 5.1 3.9* 4.9 6.8  NEUTROABS 3.9  --   --   --   HGB 14.3 13.8 15.6 15.8  HCT 42.2 41.3 47.2 47.7  MCV 91.5 92.4 92.2 92.6  PLT 152 141* 151 151   Basic Metabolic Panel: Recent Labs  Lab 06/25/23 1650 06/26/23 0316 06/27/23 0629 06/28/23 0456  NA 138 140 139 138  K 4.0 3.7 3.8 3.7  CL 105 105 105 106  CO2 24 26 26 24   GLUCOSE 92 98 104* 98  BUN 21 18 18 16   CREATININE 0.74 0.70 0.69 0.72  CALCIUM 9.0 8.7* 8.8* 8.7*   Liver Function Tests: Recent Labs  Lab 06/25/23 1650  AST 26  ALT 30  ALKPHOS 65  BILITOT 0.9  PROT 6.3*  ALBUMIN 3.5   CBG: Recent Labs  Lab 06/27/23 0844 06/27/23 1113 06/27/23 1702  GLUCAP 103* 132* 155*    Discharge time spent: greater than 30 minutes.  Signed: Alford Highland, MD [facility] 06/28/2023

## 2023-06-28 NOTE — Plan of Care (Signed)

## 2023-06-28 NOTE — Progress Notes (Signed)
Daily Progress Note   Subjective  - 1 Day Post-Op  Follow-up excision sesamoid and debridement ulceration left first MTPJ.  He is having just mild pain.  Objective Vitals:   06/27/23 2018 06/27/23 2336 06/28/23 0447 06/28/23 0830  BP: (!) 144/85 (!) 145/92 (!) 168/88 (!) 157/87  Pulse: 66 63 63 (!) 56  Resp: 18 18 18 14   Temp: 98.9 F (37.2 C) 98.4 F (36.9 C) (!) 97.5 F (36.4 C) 98.1 F (36.7 C)  TempSrc:   Oral   SpO2: 96% 97% 100% 98%  Weight:      Height:        Physical Exam: Clear drainage from the wound noted.  The medial incision is intact.  No dehiscence.  Plantar ulceration packing was removed.  No packing at this time.  Can sing to prevent drainage from occurring.  Laboratory CBC    Component Value Date/Time   WBC 6.8 06/28/2023 0456   HGB 15.8 06/28/2023 0456   HCT 47.7 06/28/2023 0456   PLT 151 06/28/2023 0456    BMET    Component Value Date/Time   NA 138 06/28/2023 0456   K 3.7 06/28/2023 0456   CL 106 06/28/2023 0456   CO2 24 06/28/2023 0456   GLUCOSE 98 06/28/2023 0456   BUN 16 06/28/2023 0456   CREATININE 0.72 06/28/2023 0456   CALCIUM 8.7 (L) 06/28/2023 0456   GFRNONAA >60 06/28/2023 0456    Assessment/Planning: Status post sesamoidectomy left first MTPJ with debridement of ulcer. Osteomyelitis tibial sesamoid with  From podiatry standpoint patient seems to be stable.  We discussed dressing changes.  He states his wife has previously been performing dressings.  I have recommended just gauze dressing with a gauze wrap and Ace wrap. Culture from 8/28 is growing strep.  Sensitivities pending. Would recommend follow-up in 2 weeks with podiatry. Continue with partial weightbearing on heel only with OrthoWedge shoe.  Should use walker for assistance.  Physical therapy has evaluated. Patient states he has home health nurse coming in once a week.  John [name] A  06/28/2023, 1:45 PM

## 2023-06-28 NOTE — Evaluation (Signed)
Physical Therapy Evaluation Patient Details Name: John [name] MRN: [id number] DOB: [birth year] Today's Date: 06/28/2023  History of Present Illness  Pt is an 82 yo male that presented to ED for increased pain in foot ulcer, fever. Workup for osteomyelitis, s/p I&D. PMH of DM, BPH, MDD.   Clinical Impression  Patient alert, agreeable to PT, seated in recliner with nursing, oriented x4 with post op shoe donned. Pt did display some decreased initiation and processing, extended time to allow him to answer questions as able. Per pt report he lives with his wife and is independent for ADLs, and utilizes a SPC.  Briefs donned with minA. Overall the patient demonstrated sit <> stand with CGA with RW. Educated on post op shoe wear, RW use and hand placement. He was able to ambulate ~133ft with supervision and able to recall to weight bear in his heel heel. Returned to room with needs in reach and education reinforced as needed. The patient demonstrated deficits (see "PT Problem List") that impede the patient's functional abilities, safety, and mobility and would benefit from skilled PT intervention.         If plan is discharge home, recommend the following: A little help with bathing/dressing/bathroom;Assistance with cooking/housework;Assist for transportation;Direct supervision/assist for medications management;Help with stairs or ramp for entrance   Can travel by private vehicle        Equipment Recommendations None recommended by PT  Recommendations for Other Services       Functional Status Assessment Patient has had a recent decline in their functional status and demonstrates the ability to make significant improvements in function in a reasonable and predictable amount of time.     Precautions / Restrictions Precautions Precautions: Fall Restrictions Weight Bearing Restrictions: No      Mobility  Bed Mobility               General bed mobility comments: pt up in recliner at  start/end of session    Transfers Overall transfer level: Needs assistance Equipment used: Rolling walker (2 wheels) Transfers: Sit to/from Stand Sit to Stand: Supervision                Ambulation/Gait Ambulation/Gait assistance: Supervision Gait Distance (Feet): 120 Feet Assistive device: Rolling walker (2 wheels)         General Gait Details: post op shoe throughout, good weight shifting noted. pt reported more confidence with RW, declined walking without it  Stairs            Wheelchair Mobility     Tilt Bed    Modified Rankin (Stroke Patients Only)       Balance Overall balance assessment: Needs assistance Sitting-balance support: Feet supported Sitting balance-Leahy Scale: Good     Standing balance support: Bilateral upper extremity supported Standing balance-Leahy Scale: Good                               Pertinent Vitals/Pain Pain Assessment Pain Assessment: No/denies pain    Home Living Family/patient expects to be discharged to:: Private residence Living Arrangements: Spouse/significant other Available Help at Discharge: Family Type of Home: House Home Access: Stairs to enter   Secretary/administrator of Steps: 1 threshold   Home Layout: One level Home Equipment: Cane - single point Additional Comments: per pt, utilizing post op shoe as well    Prior Function Prior Level of Function : Needs assist  Mobility Comments: per pt able to ambulate with cane, independent for ADLs, does not drive ADLs Comments: wife performs IADLs     Extremity/Trunk Assessment        Lower Extremity Assessment Lower Extremity Assessment:  (able to lift BLE against gravity)       Communication      Cognition Arousal: Alert Behavior During Therapy: WFL for tasks assessed/performed Overall Cognitive Status: Within Functional Limits for tasks assessed                                 General Comments:  pt oriented x4, did display some decreased intiation, and extended time for processing        General Comments      Exercises     Assessment/Plan    PT Assessment Patient needs continued PT services  PT Problem List Decreased strength;Decreased activity tolerance;Decreased balance;Decreased mobility;Pain       PT Treatment Interventions DME instruction;Neuromuscular re-education;Gait training;Patient/family education;Stair training;Functional mobility training;Therapeutic activities;Therapeutic exercise;Balance training    PT Goals (Current goals can be found in the Care Plan section)  Acute Rehab PT Goals Patient Stated Goal: to go home PT Goal Formulation: With patient Time For Goal Achievement: 07/12/23 Potential to Achieve Goals: Good    Frequency Min 1X/week     Co-evaluation               AM-PAC PT "6 Clicks" Mobility  Outcome Measure Help needed turning from your back to your side while in a flat bed without using bedrails?: None Help needed moving from lying on your back to sitting on the side of a flat bed without using bedrails?: None Help needed moving to and from a bed to a chair (including a wheelchair)?: None Help needed standing up from a chair using your arms (e.g., wheelchair or bedside chair)?: A Little Help needed to walk in hospital room?: A Little Help needed climbing 3-5 steps with a railing? : A Little 6 Click Score: 21    End of Session Equipment Utilized During Treatment: Gait belt Activity Tolerance: Patient tolerated treatment well Patient left: in chair;with call bell/phone within reach Nurse Communication: Mobility status PT Visit Diagnosis: Other abnormalities of gait and mobility (R26.89);Muscle weakness (generalized) (M62.81);Difficulty in walking, not elsewhere classified (R26.2);Pain Pain - Right/Left: Left Pain - part of body: Ankle and joints of foot    Time: 0910-0926 PT Time Calculation (min) (ACUTE ONLY): 16  min   Charges:   PT Evaluation $PT Eval Low Complexity: 1 Low PT Treatments $Therapeutic Activity: 8-22 mins PT General Charges $$ ACUTE PT VISIT: 1 Visit        Olga Coaster PT, DPT 10:34 AM,06/28/23

## 2023-06-28 NOTE — TOC Transition Note (Signed)
Transition of Care [facility]) - CM/SW Discharge Note   Patient Details  Name: John [name] MRN: [id number] Date of Birth: Aug 30, 1941  Transition of Care [facility]) CM/SW Contact:  Hetty Ely, RN Phone Number: 06/28/2023, 4:01 PM   Clinical Narrative: To discharge home with HHPT/SN via [facility], representative Elnita Maxwell accepted. TOC barriers resolved.    Final next level of care: Home w Home Health Services Barriers to Discharge: Barriers Resolved   Patient Goals and CMS Choice CMS Medicare.gov Compare Post Acute Care list provided to:: Patient Represenative (must comment) (Spouse) Choice offered to / list presented to : Spouse  Discharge Placement                    Name of family member notified: Wife, Okey Regal Patient and family notified of of transfer: 06/28/23  Discharge Plan and Services Additional resources added to the After Visit Summary for     Discharge Planning Services: CM Consult            DME Arranged: N/A DME Agency: NA       HH Arranged: PT, RN HH Agency: [facility] Date HH Agency Contacted: 06/28/23 Time HH Agency Contacted: 1330 Representative spoke with at [facility] Agency: Elnita Maxwell  Social Determinants of Health (SDOH) Interventions SDOH Screenings   Food Insecurity: No Food Insecurity (06/25/2023)  Housing: Patient Declined (06/25/2023)  Transportation Needs: No Transportation Needs (06/25/2023)  Utilities: Not At Risk (06/25/2023)  Alcohol Screen: Low Risk  (07/04/2022)  Depression (PHQ2-9): Medium Risk (05/20/2023)  Financial Resource Strain: Patient Declined (10/30/2022)   Received from [facility], [facility]  Tobacco Use: Low Risk  (06/27/2023)     Readmission Risk Interventions     No data to display

## 2023-06-30 LAB — CULTURE, BLOOD (SINGLE)
Culture: NO GROWTH
Special Requests: ADEQUATE

## 2023-06-30 LAB — AEROBIC/ANAEROBIC CULTURE W GRAM STAIN (SURGICAL/DEEP WOUND): Gram Stain: NONE SEEN

## 2023-06-30 NOTE — Progress Notes (Signed)
MRSA growing out of cx Spoke with wife and gave update.  Called doxycycline 10mg  po bid #28 into pharmacy  Dr Alford Highland

## 2023-07-02 LAB — AEROBIC/ANAEROBIC CULTURE W GRAM STAIN (SURGICAL/DEEP WOUND): Gram Stain: NONE SEEN

## 2023-07-11 NOTE — Progress Notes (Unsigned)
BH MD/PA/NP OP Progress Note  07/15/2023 11:05 AM John [name]  MRN:  [id number]  Chief Complaint:  Chief Complaint  Patient presents with   Follow-up   HPI:  According to the chart review, the following events have occurred since the last visit: The patient was admitted for osteomyelitis.   This is a follow-up appointment for depression, anxiety and insomnia.  He states that he has been worrying about things.  When he was asked to elaborate, he states that "foot is part of the problem. Hmmm." He reports difficulty with memory and concentration.  He also has word-finding difficulty.  He states that he will have dental work.  He takes about 6 months.  He wonders how it does.  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  He feels he has lost a lot of ability. The patient has mood symptoms as in PHQ-9/GAD-7.  He sleeps well without taking trazodone.  He has good appetite, and hopes that the dental procedure would help or increase p.o. intake.  He denies SI.  He denies AH.  He has VH of things moving.  He has an upcoming appointment with ophthalmologist. PT comes weekly, and he is working on exercise. They see a couple therapist every other week.   Caroll, his wife presents to the visit.  She is concerned that he has "bad dreams."  He may act in dreams at times. Merlyn Albert does not recall he had bad dreams). She denies concern otherwise.    Wt Readings from Last 3 Encounters:  07/15/23 174 lb (78.9 kg)  06/25/23 175 lb (79.4 kg)  05/20/23 176 lb 9.6 oz (80.1 kg)      Visit Diagnosis:    ICD-10-CM   1. MDD (major depressive disorder), recurrent episode, mild (HCC)  F33.0     2. Anxiety disorder, unspecified type  F41.9     3. Cognitive impairment  R41.89     4. Insomnia, unspecified type  G47.00       Past Psychiatric History: Please see initial evaluation for full  details. I have reviewed the history. No updates at this time.     Past Medical History:  Past Medical History:  Diagnosis Date   Cancer (HCC)    Depression    Depression    Diabetes mellitus without complication (HCC)    type 2   Dissection of carotid artery (HCC)    a. 06/2015 CT Head: abnl thickening of mid-dist R cervical ICA w/o narrowing - ? vasculitis and thrombosed/healing dissection; b. 01/2021 Carotid U/S: 1-39% bilat ICA stenoses.   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 Age 81 - struck in head w/ baseball --> s/p craniotomy.   Epidural hematoma (HCC)    H/O Osgood-Schlatter disease    Hand deformity, congenital    Hearing loss bilateral   Hemorrhoids    Horner syndrome    Hypertension    Migraine    Obesity    PONV (postoperative nausea and vomiting)    Prostate cancer (HCC) 2020   Rosacea    SDH (subdural hematoma) (HCC)    Sleep apnea    Spontaneous Subdural hematoma (HCC)    a. Approx 2015 - eval in [place] - conservatively managed.    Past Surgical History:  Procedure Laterality Date   ANKLE ARTHROSCOPY Left    fracture  CHOLECYSTECTOMY  2007   CRANIOTOMY  1958   left frontal craniotomy for epidural hematoma   HIP ARTHROPLASTY Right 04/07/2022   Procedure: ARTHROPLASTY BIPOLAR HIP (HEMIARTHROPLASTY);  Surgeon: Deeann Saint, MD;  Location: [facility] ORS;  Service: Orthopedics;  Laterality: Right;   INSERTION OF MESH  01/02/2022   Procedure: INSERTION OF MESH;  Surgeon: Carolan Shiver, MD;  Location: [facility] ORS;  Service: General;;   IRRIGATION AND DEBRIDEMENT FOOT Left 06/27/2023   Procedure: IRRIGATION AND DEBRIDEMENT FOOT;  Surgeon: Gwyneth Revels, DPM;  Location: [facility] ORS;  Service: Orthopedics/Podiatry;  Laterality: Left;   LIGAMENT REPAIR Right    thumb   METATARSAL HEAD EXCISION Left 06/27/2023   Procedure: EXCISION OF BONE IN GREAT TOE JOINT;  Surgeon: Gwyneth Revels, DPM;  Location: [facility] ORS;  Service: Orthopedics/Podiatry;  Laterality:  Left;   RETINAL DETACHMENT SURGERY     TREATMENT FISTULA ANAL  2006    Family Psychiatric History: Please see initial evaluation for full details. I have reviewed the history. No updates at this time.     Family History:  Family History  Problem Relation Age of Onset   Osteoporosis Mother    Depression Mother    Prostate cancer Father    Hypertension Father    Diabetes Brother    Kidney disease Neg Hx     Social History:  Social History   Socioeconomic History   Marital status: Married    Spouse name: Okey Regal   Number of children: 2   Years of education: Not on file   Highest education level: Master's degree (e.g., MA, MS, MEng, MEd, MSW, MBA)  Occupational History   Not on file  Tobacco Use   Smoking status: Never   Smokeless tobacco: Never  Vaping Use   Vaping status: Never Used  Substance and Sexual Activity   Alcohol use: Not Currently   Drug use: No   Sexual activity: Yes    Birth control/protection: None  Other Topics Concern   Not on file  Social History Narrative   Live at home with wife.    Social Determinants of Health   Financial Resource Strain: Patient Declined (10/30/2022)   [facility], [facility]   Overall Financial Resource Strain (CARDIA)    Difficulty of Paying Living Expenses: Patient declined  Food Insecurity: No Food Insecurity (06/25/2023)   Hunger Vital Sign    Worried About Running Out of Food in the Last Year: Never true    Ran Out of Food in the Last Year: Never true  Transportation Needs: No Transportation Needs (06/25/2023)   PRAPARE - Administrator, Civil Service (Medical): No    Lack of Transportation (Non-Medical): No  Physical Activity: Not on file  Stress: Not on file  Social Connections: Not on file    Allergies: No Known Allergies  Metabolic Disorder Labs: Lab Results  Component Value Date   HGBA1C 5.6 03/19/2022   MPG 114.02 03/19/2022   No results  found for: "PROLACTIN" Lab Results  Component Value Date   CHOL 131 03/19/2022   TRIG 92 03/19/2022   HDL 33 (L) 03/19/2022   CHOLHDL 4.0 03/19/2022   VLDL 18 03/19/2022   LDLCALC 80 03/19/2022   Lab Results  Component Value Date   TSH 0.766 04/01/2022    Therapeutic Level Labs: No results found for: "LITHIUM" No results found for: "VALPROATE" No results found for: "CBMZ"  Current Medications: Current Outpatient Medications  Medication Sig Dispense Refill  acetaminophen (TYLENOL) 325 MG tablet Take 2 tablets (650 mg total) by mouth every 6 (six) hours as needed for mild pain (or Fever >/= 101).     aspirin EC 81 MG tablet Take 81 mg by mouth daily.     docusate sodium (COLACE) 100 MG capsule Take 1 capsule (100 mg total) by mouth 2 (two) times daily. 60 capsule 3   polyethylene glycol (MIRALAX / GLYCOLAX) 17 g packet Take 17 g by mouth daily as needed for moderate constipation. 30 each 0   sertraline (ZOLOFT) 100 MG tablet Take 1 tablet (100 mg total) by mouth at bedtime. Total of 150 mg at night. Take along with 50 mg tab 90 tablet 0   sertraline (ZOLOFT) 50 MG tablet Take 1 tablet (50 mg total) by mouth at bedtime. Total of 150 mg at night. Take along with 100 mg tab 90 tablet 0   tamsulosin (FLOMAX) 0.4 MG CAPS capsule Take 1 capsule (0.4 mg total) by mouth in the morning and at bedtime. 60 capsule 11   [START ON 08/03/2023] buPROPion (WELLBUTRIN XL) 300 MG 24 hr tablet Take 1 tablet (300 mg total) by mouth daily. 90 tablet 1   [START ON 08/31/2023] OLANZapine (ZYPREXA) 10 MG tablet Take 1 tablet (10 mg total) by mouth at bedtime. 90 tablet 0   traZODone (DESYREL) 50 MG tablet Take 1 tablet (50 mg total) by mouth at bedtime as needed for sleep. (Patient not taking: Reported on 07/15/2023) 90 tablet 1   No current facility-administered medications for this visit.     Musculoskeletal: Strength & Muscle Tone: within normal limits Gait & Station: normal Patient leans:  N/A  Psychiatric Specialty Exam: Review of Systems  Psychiatric/Behavioral:  Positive for decreased concentration and dysphoric mood. Negative for agitation, behavioral problems, confusion, hallucinations, self-injury, sleep disturbance and suicidal ideas. The patient is nervous/anxious. The patient is not hyperactive.   All other systems reviewed and are negative.   Blood pressure 135/79, pulse 61, temperature 97.7 F (36.5 C), temperature source Skin, height 6\' 2"  (1.88 m), weight 174 lb (78.9 kg).Body mass index is 22.34 kg/m.  General Appearance: Well Groomed  Eye Contact:  Good  Speech:   slightly increase in speech latency  Volume:  Normal  Mood:  Anxious  Affect:  Appropriate, Congruent, and calm less restricted  Thought Process:  Coherent  Orientation:  Full (Time, Place, and Person)  Thought Content: Logical   Suicidal Thoughts:  No  Homicidal Thoughts:  No  Memory:  Immediate;   Good  Judgement:  Good  Insight:  Good  Psychomotor Activity:  Normal  Concentration:  Concentration: Fair and Attention Span: Fair  Recall:  Good  Fund of Knowledge: Good  Language: Good  Akathisia:  No  Handed:  Right  AIMS (if indicated): not done  Assets:  Communication Skills Desire for Improvement  ADL's:  Intact  Cognition: WNL  Sleep:  Good   Screenings: AIMS    Flowsheet Row Admission (Discharged) from 04/11/2022 in [facility]  AIMS Total Score 6      AUDIT    Flowsheet Row Admission (Discharged) from 07/04/2022 in [facility] Admission (Discharged) from 04/04/2022 in [facility] Admission (Discharged) from 03/13/2022 in [facility]AL MEDIC[facility]  Alcohol Use Disorder Identification Test Final Score (AUDIT) 1 0 0      ECT-MADRS    Flowsheet Row ECT Treatment from 09/06/2022 in [facility] Admission (Discharged) from  03/13/2022 in [facility] BE[facility]AL MEDICINE   MADRS Total Score 13 42      GAD-7    Flowsheet Row Office Visit from 05/20/2023 in [facility] Office Visit from 03/11/2023 in [facility] Office Visit from 12/02/2022 in [facility] Office Visit from 10/17/2022 in [facility] Office Visit from 08/26/2022 in [facility]  Total GAD-7 Score 3 9 12 6 12       Mini-Mental    Flowsheet Row ECT Treatment from 08/16/2022 in [facility] Admission (Discharged) from 03/13/2022 in [facility] BEHAVIORAL MEDICINE  Total Score (max 30 points ) 30 22      PHQ2-9    Flowsheet Row Office Visit from 07/15/2023 in [facility] Office Visit from 05/20/2023 in [facility] Office Visit from 03/11/2023 in [facility] Office Visit from 12/02/2022 in [facility] Office Visit from 10/17/2022 in [facility]  PHQ-2 Total Score 4 2 2 4 2   PHQ-9 Total Score 8 8 5 13 7       Flowsheet Row Office Visit from 07/15/2023 in [facility] Hosp-Admission (Discharged) from 06/25/2023 in [facility] (1A) Office Visit from 12/02/2022 in [facility]  C-SSRS RISK CATEGORY Error: Question 6 not populated No Risk No Risk        Assessment and Plan:  Kainen Yax is a 82 y.o. year old male with a history of depression, diabetes, hypertension, PAfib, sleep apnea, subdural hematoma, epidural hematoma in 1958,  seizure disorder (no episode for the past five years, not on anticonvulsant), hip fracture,  right femur after mechanical fall s/p  right hip arthroplasty on 6/11, who presents for follow up appointment for below.   1. MDD (major depressive disorder), recurrent episode, mild (HCC) 2. Anxiety disorder, unspecified type Acute stressors include: concern about the rental property, financial strain, hip fracture, newly found Afib,recent admission due to osteomyelitis  Other stressors include: head injury as a teenager s/p removal of the hematoma   History:good response to ECT 2023 (concern about memory loss), admitted to [facility] in May 2023 for depression, SI, and Sept 2023 for depression (initially confused and non verbal).    Although the exam is notable for increased latency, he demonstrates calm demeanor, more engaged during the visit, and he expresses less rumination on his physical symptoms on today's evaluation.  Although he continues to experience depressive symptoms and anxiety, it has been overall improving.  Will continue current dose of sertraline to target depression and anxiety.  Will continue bupropion, olanzapine for depression. Will not uptitrate bupropion further given his history of seizure; the dose has been maintained at the current dose since his recent admission.  He will greatly benefit from CBT; he will continue to see a therapist.   3. Cognitive impairment Functional Status   IADL: Independent in the following:            Requires assistance with the following: managing finances, medications, driving ADL  Independent in the following: bathing and hygiene, feeding, continence, grooming and toileting, walking (walker)          Requires assistance with the following: Folate, Vitamin B12 wnl 01/2023,  (TSH 11/2022 wnl) Images: Head MRI 07/2022- 1. No  acute intracranial abnormality. 2. Progressed chronic small vessel disease since a 2016 MRI, including fairly numerous chronic micro hemorrhages in both thalami. 3. Prior left frontal craniotomy/cranioplasty with subtle chronic encephalomalacia of underlying frontal  gyri. Neuropsych assessment: SLUMS 15/30 06/2022, MOCA 20/30 03/11/2023, mainly  had issues in visuospatial, delayed recall (able to recall with cue 2/5) Etiology: r/o vascular, depression, s/p ECT, TBI (h/o epidural hematoma s/p craniotomy in 1958)  Slightly worsening in speech latency/paucity of thoughts, which are likely secondary to recent surgery/admission r/o delirium on top of other etiology as outlined above.  Will continue to assist.   4. Insomnia, unspecified type Overall improving.  Will discontinue trazodone.  Will continue olanzapine at the current dose also to target his mood symptoms.     Plan  Continue sertraline 150 mg at night  Continue bupropion 300 mg daily  Continue olanzapine 10 mg at night  HR 65, QTc 434 msec, first degree AV 09/2022 Hold trazodone (was on 50 mg at night) Next appointment: 11/12 at 1 pm for 30 mins, IP   The patient demonstrates the following risk factors for suicide: Chronic risk factors for suicide include: psychiatric disorder of depression and previous suicide attempts of putting a bag over his head many years ago, but then he took it off. . Acute risk factors for suicide include: unemployment and loss (financial, interpersonal, professional). Protective factors for this patient include: positive social support and hope for the future. Considering these factors, the overall suicide risk at this point appears to be low. Patient is appropriate for outpatient follow up.   Collaboration of Care: Collaboration of Care: Other reviewed notes in Epic  Patient/Guardian was advised Release of Information must be obtained prior to any record release in order to collaborate their care with an outside provider. Patient/Guardian was advised if they have not already done so to contact the registration department to sign all necessary forms in order for [place] to release information regarding their care.   Consent: Patient/Guardian gives verbal consent for treatment and  assignment of benefits for services provided during this visit. Patient/Guardian expressed understanding and agreed to proceed.    Neysa Hotter, MD 07/15/2023, 11:05 AM

## 2023-07-15 ENCOUNTER — Ambulatory Visit (INDEPENDENT_AMBULATORY_CARE_PROVIDER_SITE_OTHER): Payer: Medicare Other | Admitting: Psychiatry

## 2023-07-15 ENCOUNTER — Encounter: Payer: Self-pay | Admitting: Psychiatry

## 2023-07-15 VITALS — BP 135/79 | HR 61 | Temp 97.7°F | Ht 74.0 in | Wt 174.0 lb

## 2023-07-15 DIAGNOSIS — R4189 Other symptoms and signs involving cognitive functions and awareness: Secondary | ICD-10-CM | POA: Diagnosis not present

## 2023-07-15 DIAGNOSIS — G4700 Insomnia, unspecified: Secondary | ICD-10-CM

## 2023-07-15 DIAGNOSIS — F330 Major depressive disorder, recurrent, mild: Secondary | ICD-10-CM

## 2023-07-15 DIAGNOSIS — F419 Anxiety disorder, unspecified: Secondary | ICD-10-CM

## 2023-07-15 MED ORDER — OLANZAPINE 10 MG PO TABS: 10.0000 mg | ORAL_TABLET | Freq: Every day | ORAL | 0 refills | Status: DC

## 2023-07-15 MED ORDER — BUPROPION HCL ER (XL) 300 MG PO TB24: 300.0000 mg | ORAL_TABLET | Freq: Every day | ORAL | 1 refills | Status: DC

## 2023-07-15 NOTE — Patient Instructions (Signed)
Continue sertraline 150 mg at night  Continue bupropion 300 mg daily  Continue olanzapine 10 mg at night   Hold trazodone  Next appointment: 11/12 at 1 pm

## 2023-08-20 IMAGING — DX DG CHEST 1V PORT
1 series · 1 of 1 positions shown · non-contrast
Comparison: 11/09/2021.

CLINICAL DATA: Short of breath.

EXAM:
PORTABLE CHEST 1 VIEW

[chest ap]
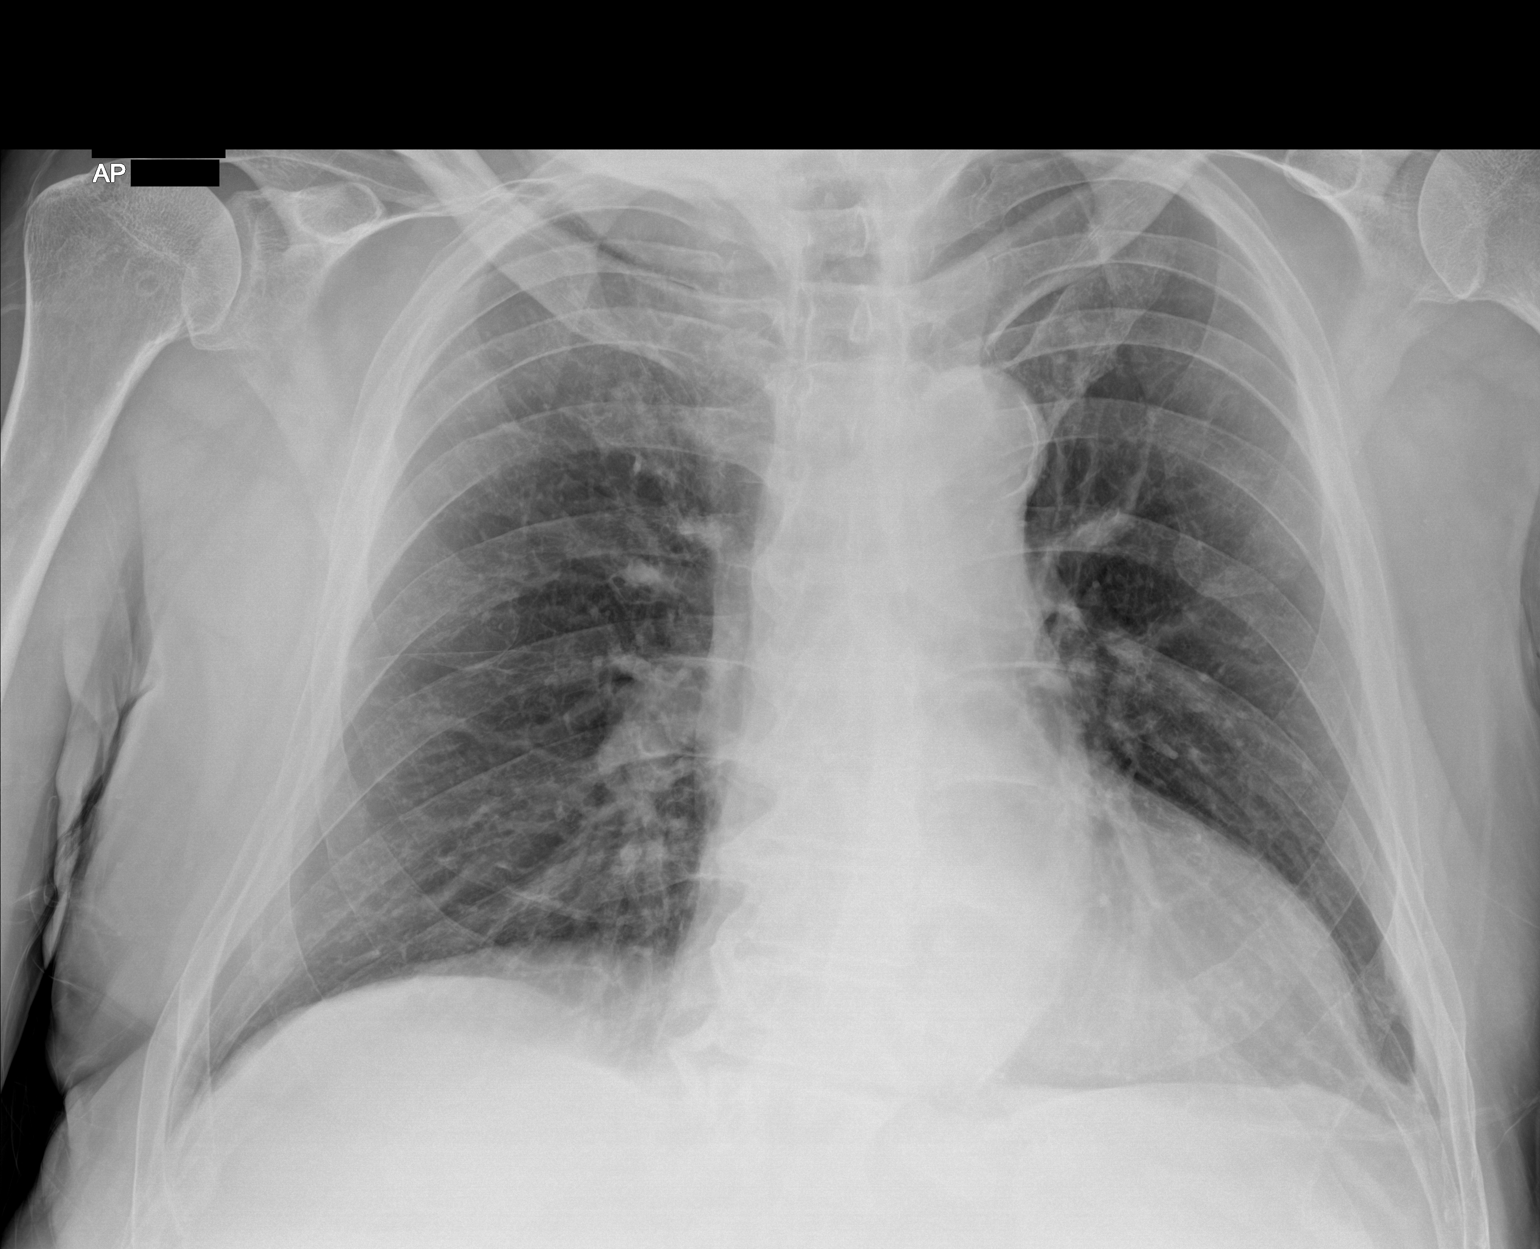

[1 of 1 positions shown; findings below may reference images not displayed]

FINDINGS: Cardiac silhouette normal in size.  No mediastinal or hilar masses.

Clear lungs.  No pleural effusion or pneumothorax.

Skeletal structures are grossly intact.
IMPRESSION: No active disease.

## 2023-08-20 IMAGING — CT CT HEAD W/O CM
4 series · 17 of 47 positions shown, 19 images · non-contrast
Comparison: March 25, 2022

CLINICAL DATA: Status post trauma, altered mental status.



[Series 2: head bone · axial · 0.39mm/px · z∈[-146,-90]mm · 4 of 80 slices shown]
[im 8/80  bone]
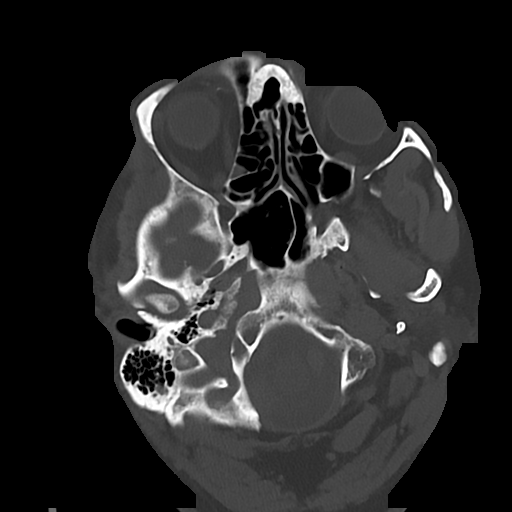
[im 16/80  bone]
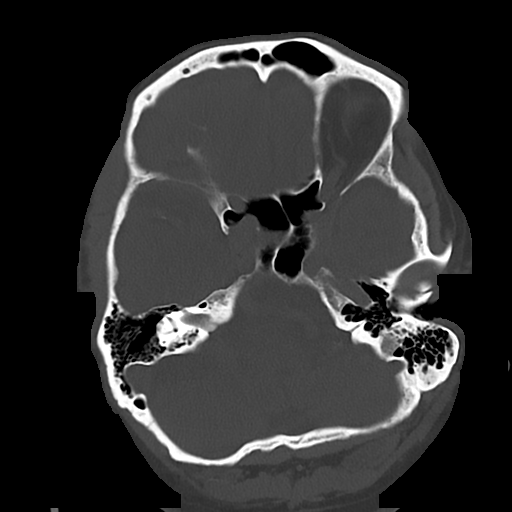
[im 24/80  bone]
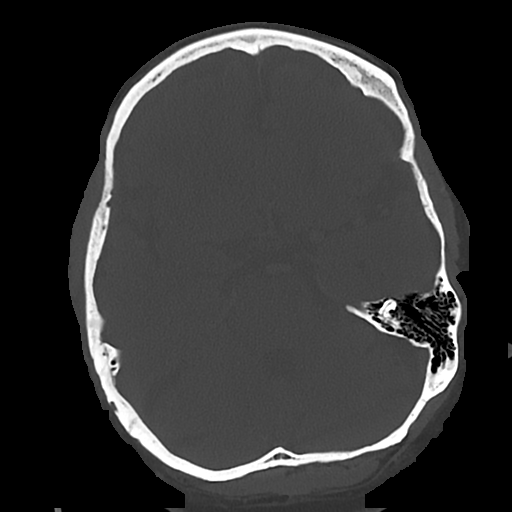
[im 36/80  bone]
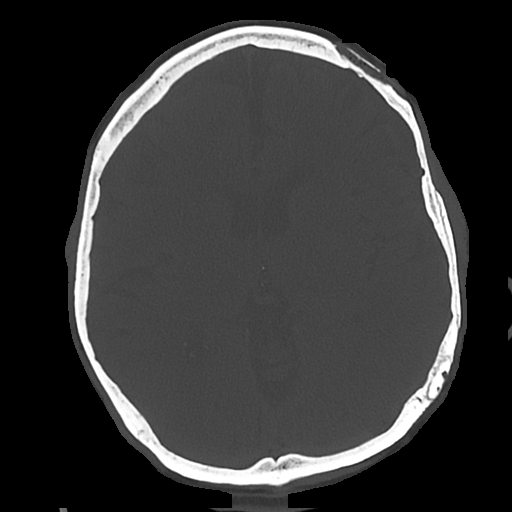

[Series 3: head wo · axial · 0.39mm/px · z∈[-145,-25]mm · 7 of 32 slices shown, 9 images]
[im 4/32  brain]
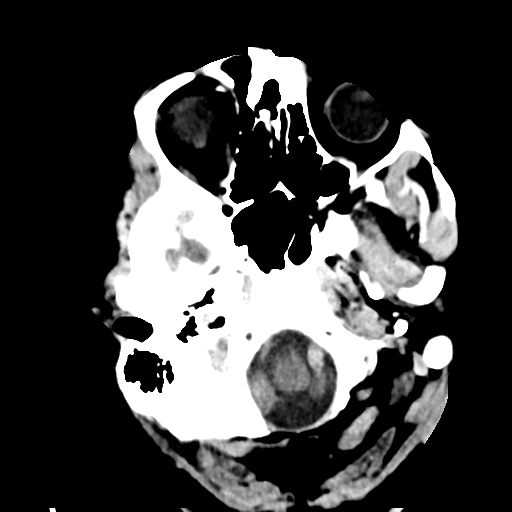
[im 4/32  bone]
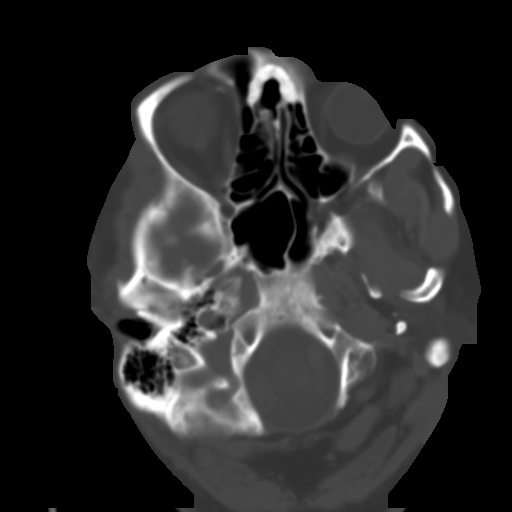
[im 8/32  brain]
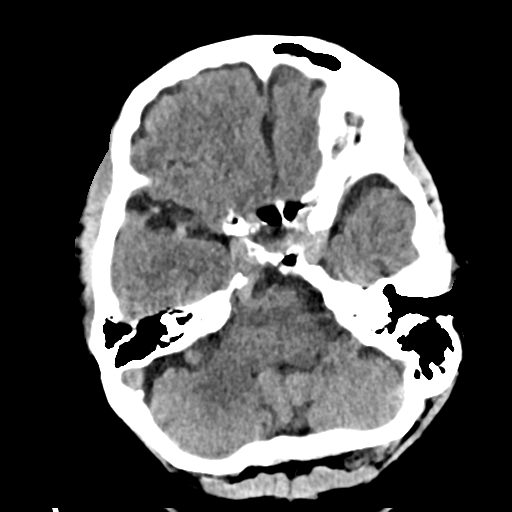
[im 12/32  brain]
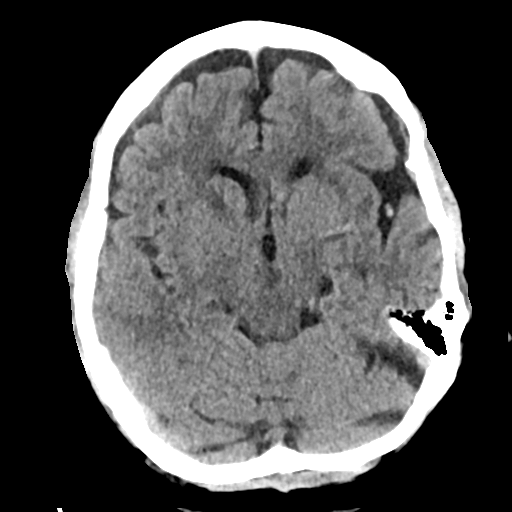
[im 16/32  brain]
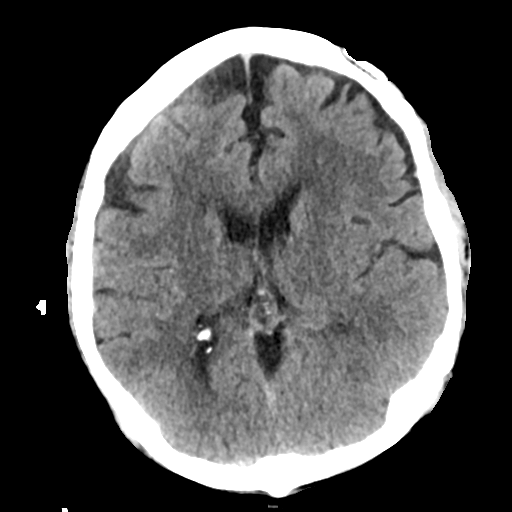
[im 20/32  brain]
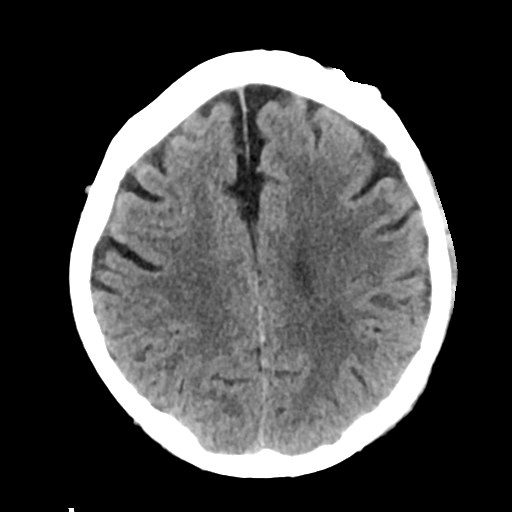
[im 20/32  bone]
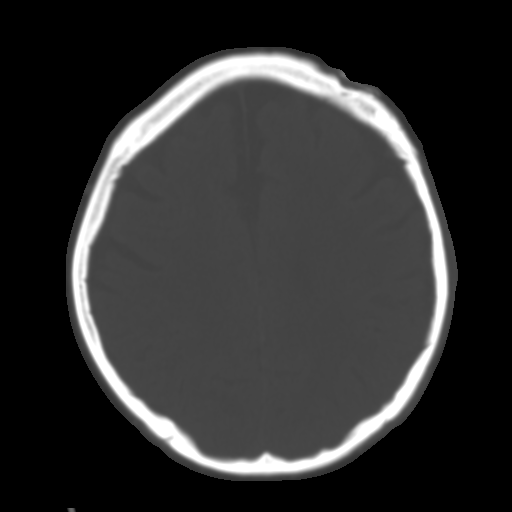
[im 24/32  brain]
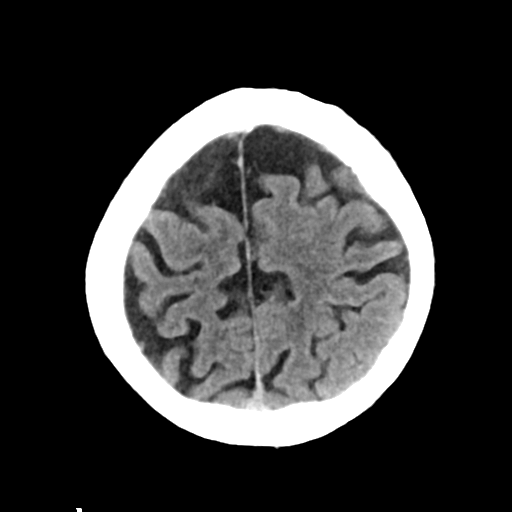
[im 28/32  brain]
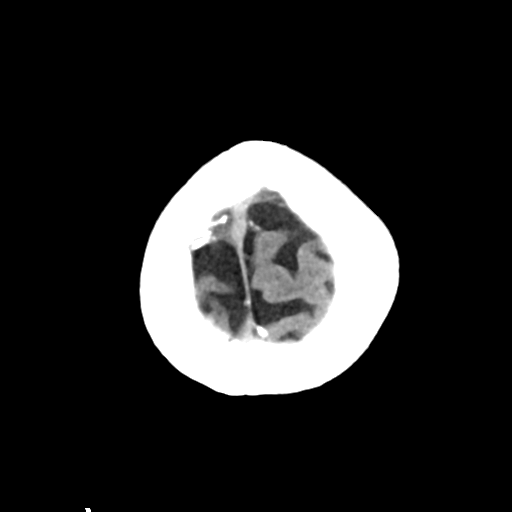

[Series 4: cor soft · coronal · 0.29mm/px · 3 of 65 slices shown]
[im 22/65  brain]
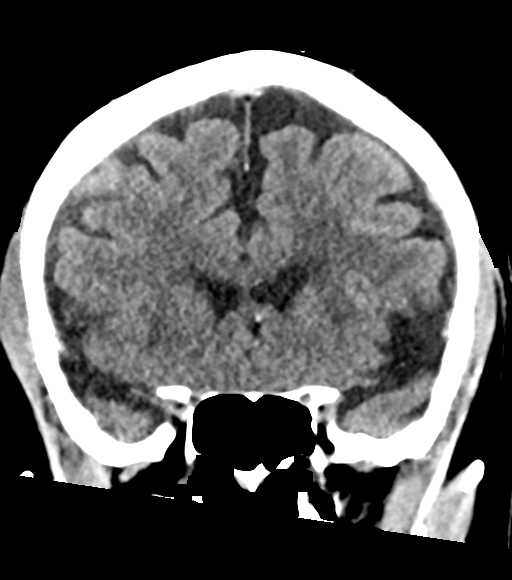
[im 29/65  brain]
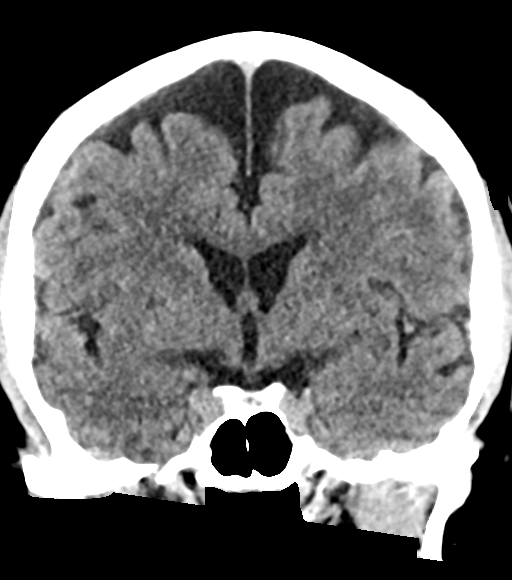
[im 36/65  brain]
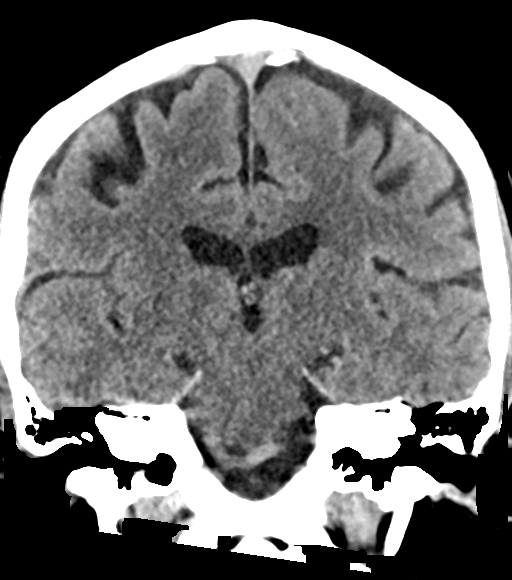

[Series 5: sag soft · sagittal · 0.35mm/px · 3 of 59 slices shown]
[im 22/59  brain]
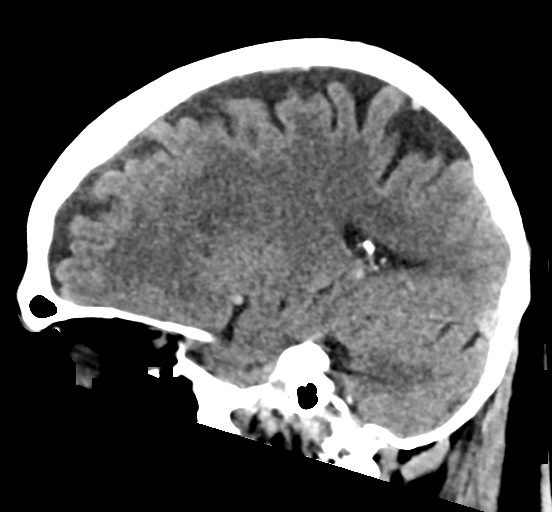
[im 30/59  brain]
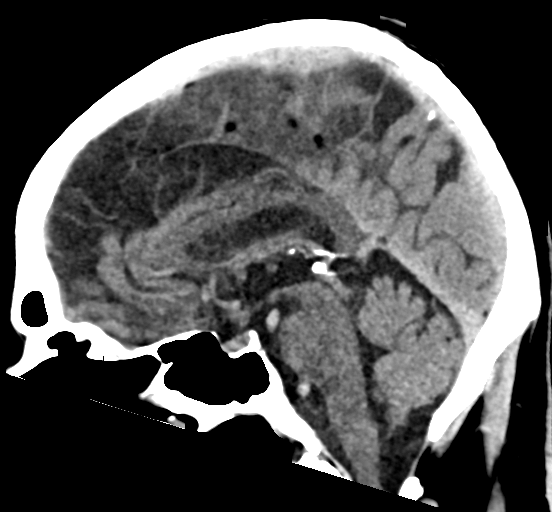
[im 37/59  brain]
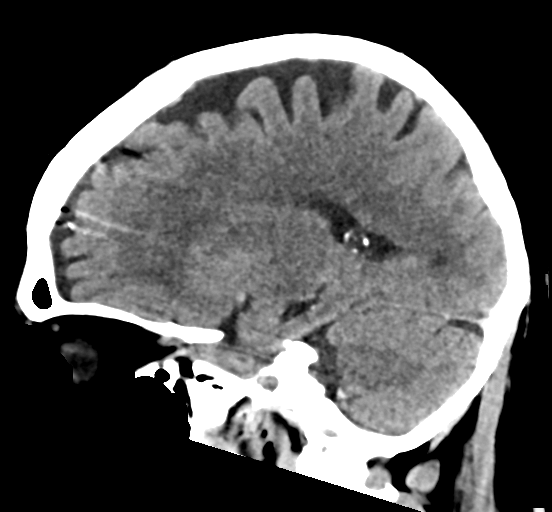

[17 of 47 positions shown; findings below may reference images not displayed]

FINDINGS: Brain: There is mild cerebral atrophy with widening of the
extra-axial spaces and ventricular dilatation.
There are areas of decreased attenuation within the white matter
tracts of the supratentorial brain, consistent with microvascular
disease changes.

Vascular: No hyperdense vessel or unexpected calcification.

Skull: Normal. Negative for fracture or focal lesion.

Sinuses/Orbits: A left frontal burr hole and partial craniotomy
defect is seen.

Other: None.
IMPRESSION: 1. No acute intracranial abnormality.
2. Generalized cerebral atrophy with chronic white matter small
vessel ischemic changes.

## 2023-08-20 IMAGING — CR DG HIP (WITH OR WITHOUT PELVIS) 2-3V*R*
3 series · 3 of 3 positions shown · non-contrast
Comparison: None Available.

CLINICAL DATA: Trauma, fall

EXAM:
DG HIP (WITH OR WITHOUT PELVIS) 2-3V RIGHT

[pelvis ap]
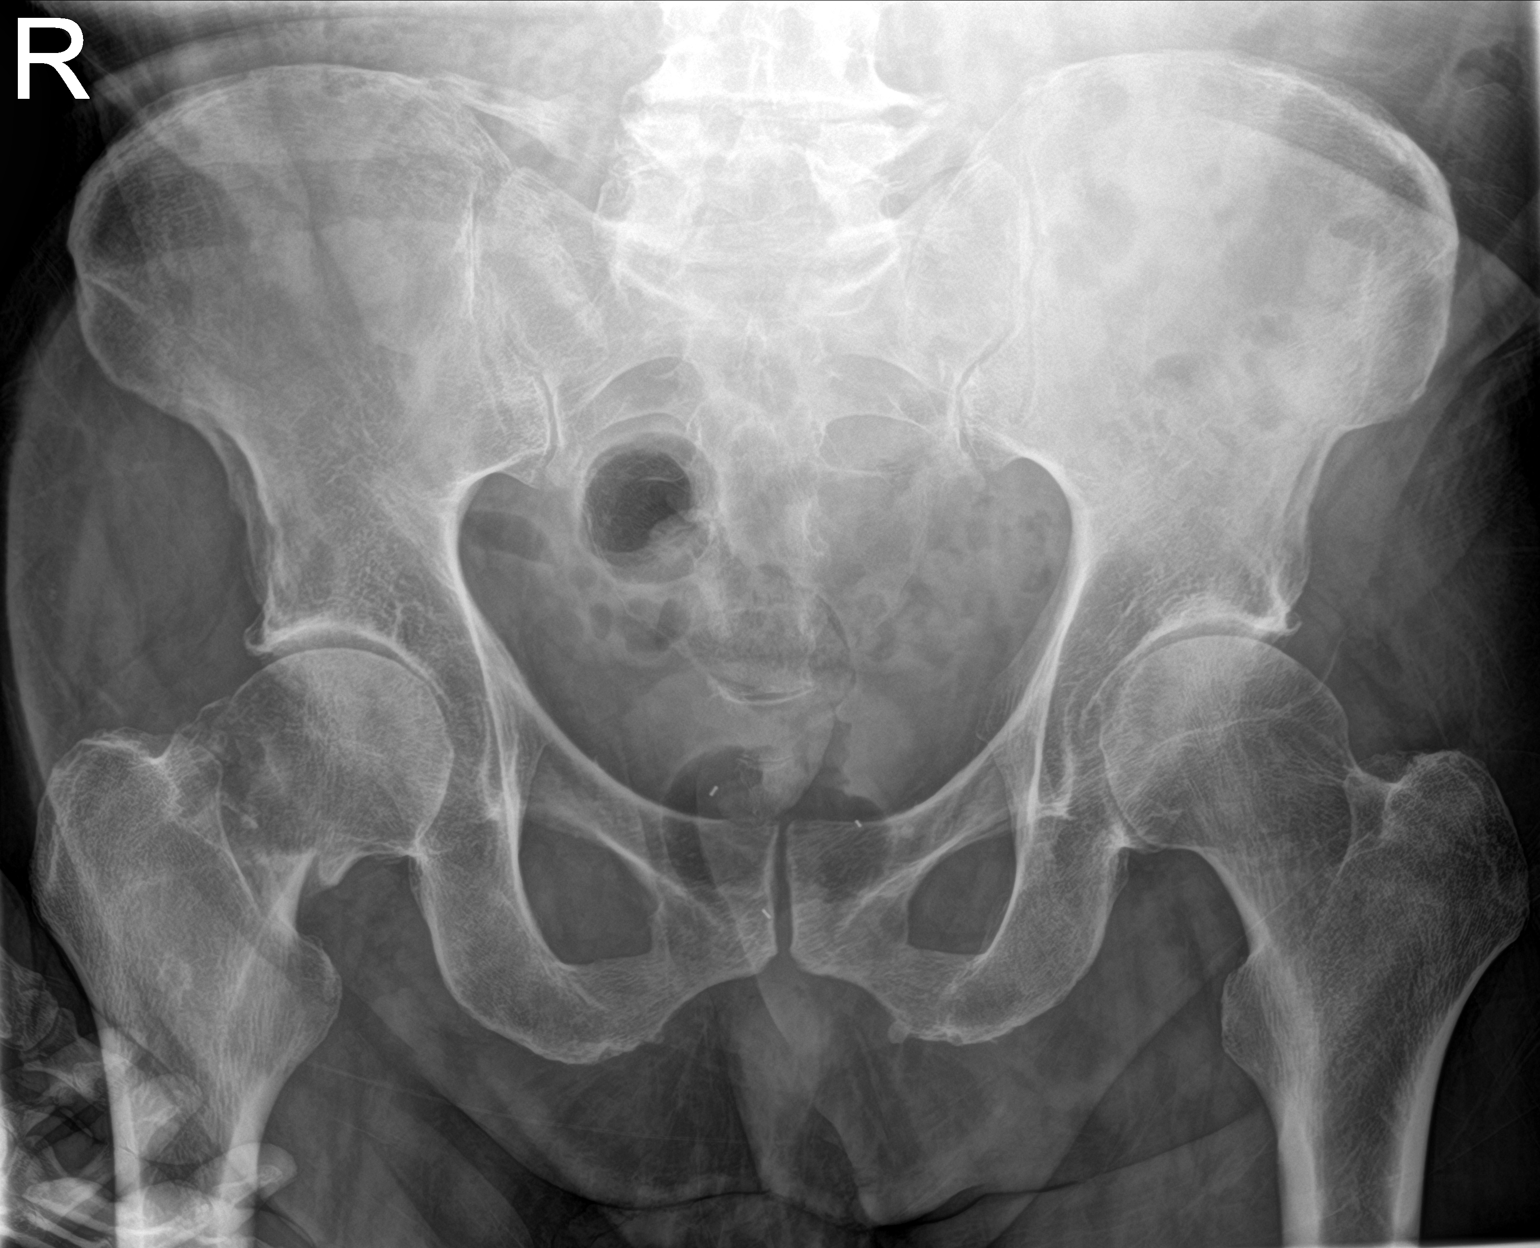

[hip ap]
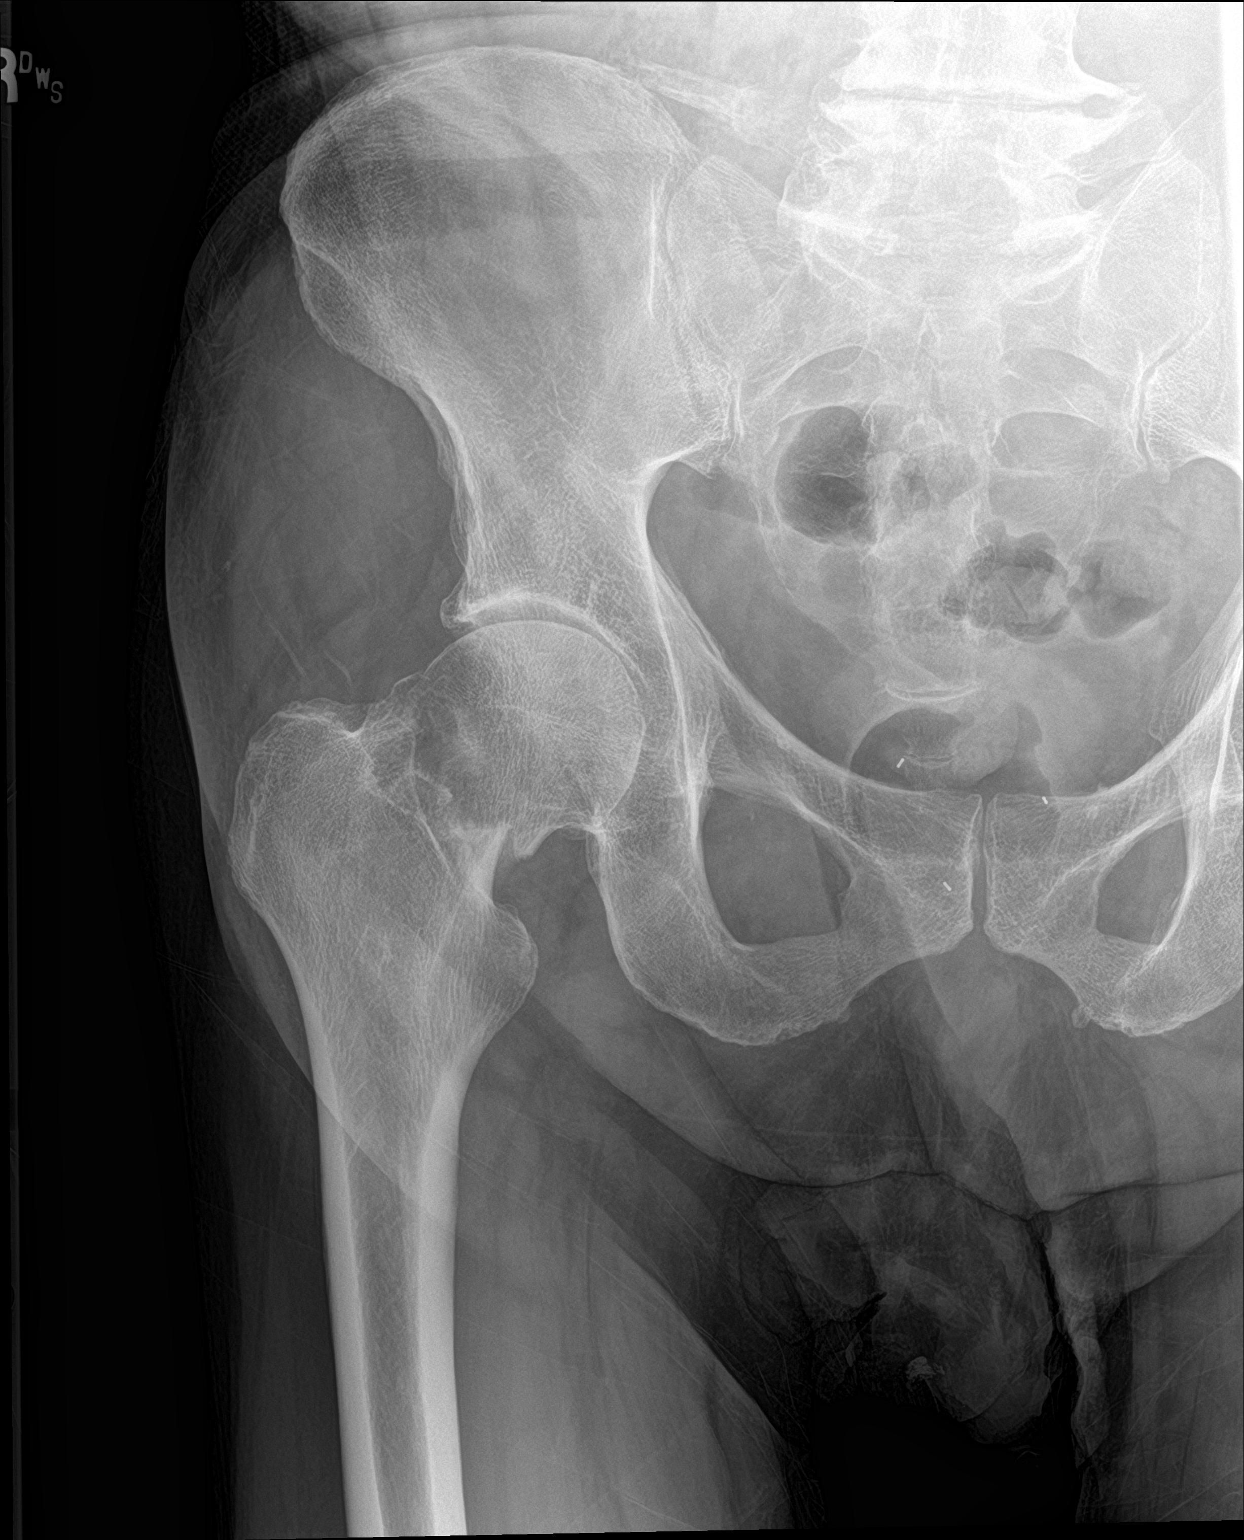

[hip lat]
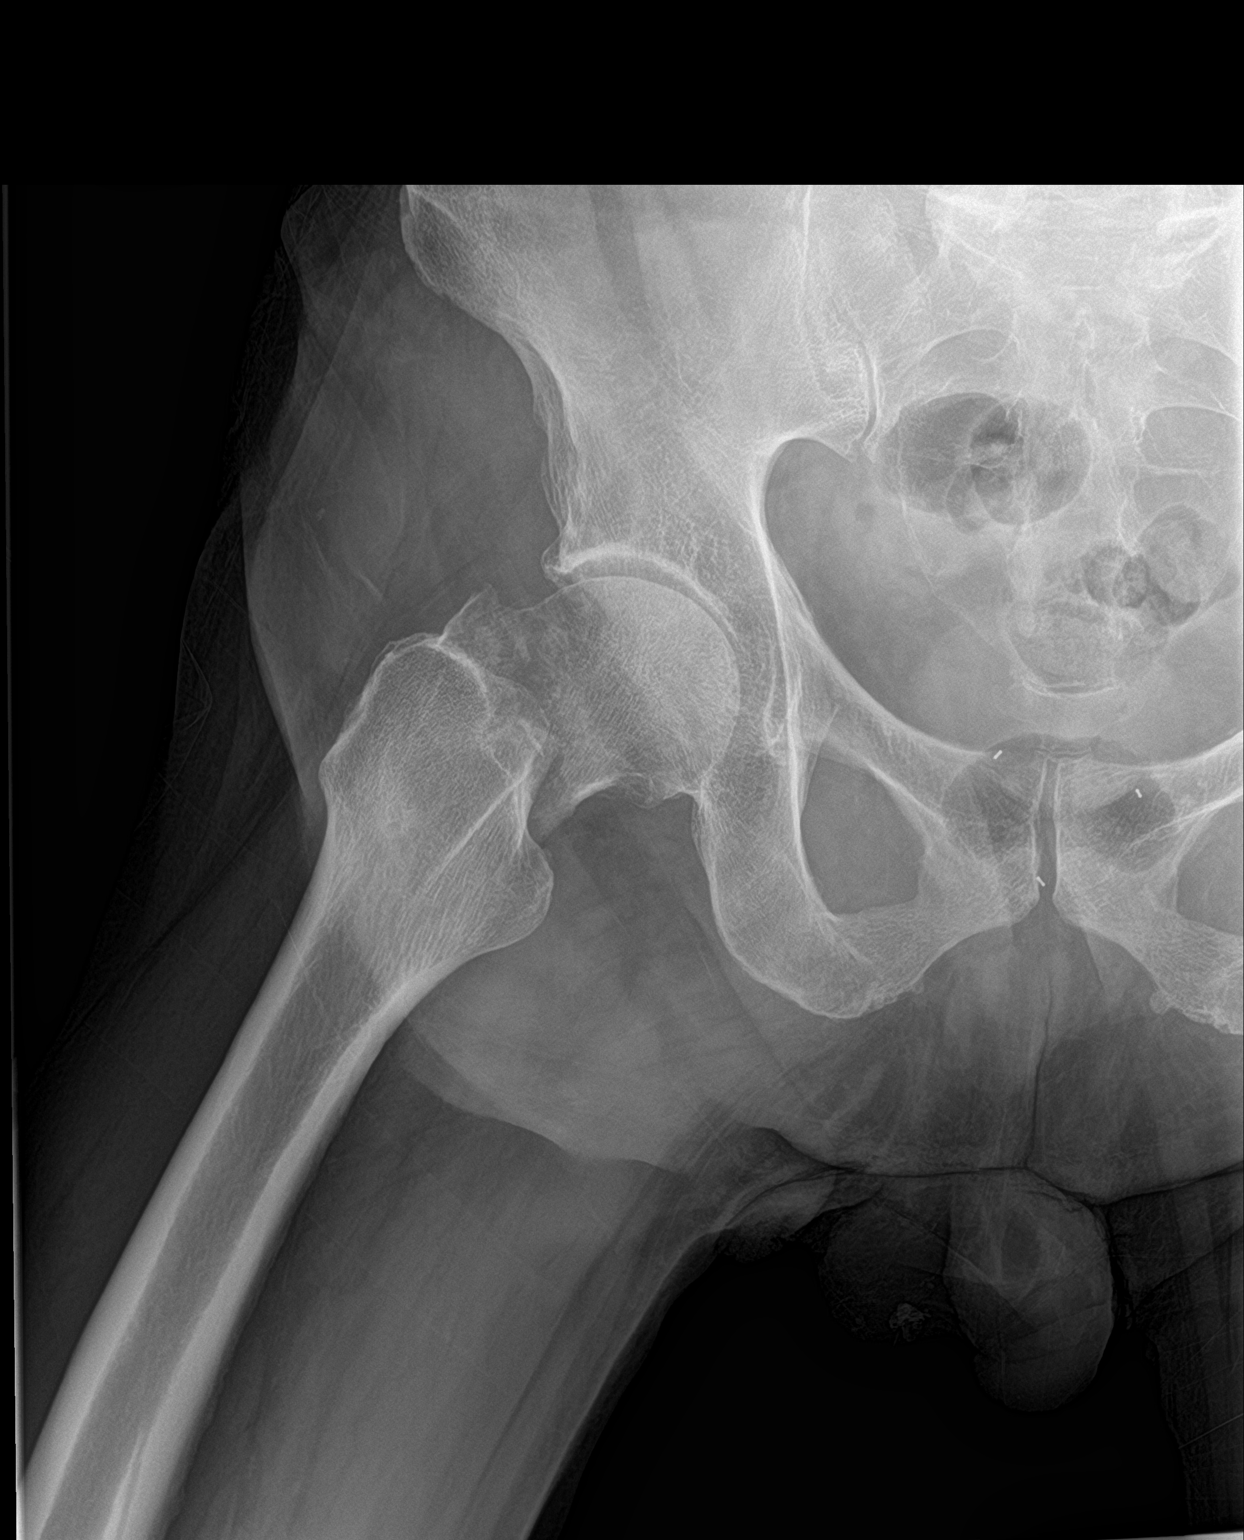

[3 of 3 positions shown; findings below may reference images not displayed]

FINDINGS: Acute fracture is seen in the neck of right femur. There is 9 mm
offset in the alignment of medial cortical margin of neck. There is
no dislocation. Degenerative changes are noted in the visualized
lower lumbar spine. Surgical clips are seen in the region of
prostate.
IMPRESSION: There is displaced fracture in the neck of right femur.

## 2023-09-03 NOTE — Progress Notes (Signed)
BH MD/PA/NP OP Progress Note  09/09/2023 1:43 PM John [name]  MRN:  [id number]  Chief Complaint:  Chief Complaint  Patient presents with   Follow-up   HPI:  This is a follow-up appointment for depression, anxiety and insomnia.  He states that he has been worried about his physical health.  He noticed some click when he turns his head, and was seen by [facility]. He was prescribed tizanidine (he later asks if this is similar to olanzapine due to its similar spelling).  He states that his mood is in between good and down.  Although he would like to stay in the bed, he has been up doing things.  He has mixed feelings about his son visiting him.  He is concerned that he is not himself such as not be able to engage in a conversation.  He does not know how to use electronics as he used to.  While he agrees that it is frustrating, he also states that his therapist commented that he has been doing better.  He has been able to walk without a walker at home.  He enjoys watching sports games on TV.  He is paying bills, managing finances and meals, although he experiences difficulty in concentration. The patient has mood symptoms as in PHQ-9/GAD-7. He denies SI. He denies hallucinations except that he has floater. He denies paranoia.   Wt Readings from Last 3 Encounters:  09/09/23 185 lb (83.9 kg)  07/15/23 174 lb (78.9 kg)  06/25/23 175 lb (79.4 kg)    Visit Diagnosis:    ICD-10-CM   1. MDD (major depressive disorder), recurrent episode, mild (HCC)  F33.0     2. Anxiety disorder, unspecified type  F41.9     3. Cognitive impairment  R41.89     4. Insomnia, unspecified type  G47.00       Past Psychiatric History: Please see initial evaluation for full details. I have reviewed the history. No updates at this time.     Past Medical History:  Past Medical History:  Diagnosis Date   Cancer (HCC)    Depression    Depression    Diabetes mellitus without complication (HCC)    type 2    Dissection of carotid artery (HCC)    a. 06/2015 CT Head: abnl thickening of mid-dist R cervical ICA w/o narrowing - ? vasculitis and thrombosed/healing dissection; b. 01/2021 Carotid U/S: 1-39% bilat ICA stenoses.   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 Age 73 - struck in head w/ baseball --> s/p craniotomy.   Epidural hematoma (HCC)    H/O Osgood-Schlatter disease    Hand deformity, congenital    Hearing loss bilateral   Hemorrhoids    Horner syndrome    Hypertension    Migraine    Obesity    PONV (postoperative nausea and vomiting)    Prostate cancer (HCC) 2020   Rosacea    SDH (subdural hematoma) (HCC)    Sleep apnea    Spontaneous Subdural hematoma (HCC)    a. Approx 2015 - eval in [place] - conservatively managed.    Past Surgical History:  Procedure Laterality Date   ANKLE ARTHROSCOPY Left    fracture   CHOLECYSTECTOMY  2007   CRANIOTOMY  1958   left frontal craniotomy for epidural hematoma   HIP ARTHROPLASTY Right 04/07/2022   Procedure: ARTHROPLASTY BIPOLAR HIP (HEMIARTHROPLASTY);  Surgeon: Deeann Saint, MD;  Location: [facility];  Service: Orthopedics;  Laterality: Right;   INSERTION OF MESH  01/02/2022   Procedure: INSERTION OF MESH;  Surgeon: Carolan Shiver, MD;  Location: [facility];  Service: General;;   IRRIGATION AND DEBRIDEMENT FOOT Left 06/27/2023   Procedure: IRRIGATION AND DEBRIDEMENT FOOT;  Surgeon: Gwyneth Revels, DPM;  Location: [facility];  Service: Orthopedics/Podiatry;  Laterality: Left;   LIGAMENT REPAIR Right    thumb   METATARSAL HEAD EXCISION Left 06/27/2023   Procedure: EXCISION OF BONE IN GREAT TOE JOINT;  Surgeon: Gwyneth Revels, DPM;  Location: [facility];  Service: Orthopedics/Podiatry;  Laterality: Left;   RETINAL DETACHMENT SURGERY     TREATMENT FISTULA ANAL  2006    Family Psychiatric History: Please see initial evaluation for full details. I have reviewed the history. No updates at this time.     Family History:  Family  History  Problem Relation Age of Onset   Osteoporosis Mother    Depression Mother    Prostate cancer Father    Hypertension Father    Diabetes Brother    Kidney disease Neg Hx     Social History:  Social History   Socioeconomic History   Marital status: Married    Spouse name: Okey Regal   Number of children: 2   Years of education: Not on file   Highest education level: Master's degree (e.g., MA, MS, MEng, MEd, MSW, MBA)  Occupational History   Not on file  Tobacco Use   Smoking status: Never   Smokeless tobacco: Never  Vaping Use   Vaping status: Never Used  Substance and Sexual Activity   Alcohol use: Not Currently   Drug use: No   Sexual activity: Yes    Birth control/protection: None  Other Topics Concern   Not on file  Social History Narrative   Live at home with wife.    Social Determinants of Health   Financial Resource Strain: Patient Declined (10/30/2022)   Received from [facility], [facility]   Overall Financial Resource Strain (CARDIA)    Difficulty of Paying Living Expenses: Patient declined  Food Insecurity: No Food Insecurity (06/25/2023)   Hunger Vital Sign    Worried About Running Out of Food in the Last Year: Never true    Ran Out of Food in the Last Year: Never true  Transportation Needs: No Transportation Needs (06/25/2023)   PRAPARE - Administrator, Civil Service (Medical): No    Lack of Transportation (Non-Medical): No  Physical Activity: Not on file  Stress: Not on file  Social Connections: Not on file    Allergies: No Known Allergies  Metabolic Disorder Labs: Lab Results  Component Value Date   HGBA1C 5.6 03/19/2022   MPG 114.02 03/19/2022   No results found for: "PROLACTIN" Lab Results  Component Value Date   CHOL 131 03/19/2022   TRIG 92 03/19/2022   HDL 33 (L) 03/19/2022   CHOLHDL 4.0 03/19/2022   VLDL 18 03/19/2022   LDLCALC 80 03/19/2022   Lab Results  Component Value Date    TSH 0.766 04/01/2022    Therapeutic Level Labs: No results found for: "LITHIUM" No results found for: "VALPROATE" No results found for: "CBMZ"  Current Medications: Current Outpatient Medications  Medication Sig Dispense Refill   tiZANidine (ZANAFLEX) 2 MG tablet Take 2 mg by mouth every 6 (six) hours.     acetaminophen (TYLENOL) 325 MG tablet Take 2 tablets (650 mg total) by mouth every 6 (six) hours as needed  for mild pain (or Fever >/= 101).     aspirin EC 81 MG tablet Take 81 mg by mouth daily.     buPROPion (WELLBUTRIN XL) 300 MG 24 hr tablet Take 1 tablet (300 mg total) by mouth daily. 90 tablet 1   docusate sodium (COLACE) 100 MG capsule Take 1 capsule (100 mg total) by mouth 2 (two) times daily. 60 capsule 3   OLANZapine (ZYPREXA) 10 MG tablet Take 1 tablet (10 mg total) by mouth at bedtime. 90 tablet 0   polyethylene glycol (MIRALAX / GLYCOLAX) 17 g packet Take 17 g by mouth daily as needed for moderate constipation. 30 each 0   sertraline (ZOLOFT) 100 MG tablet Take 1 tablet (100 mg total) by mouth at bedtime. Total of 150 mg at night. Take along with 50 mg tab 90 tablet 0   sertraline (ZOLOFT) 50 MG tablet Take 1 tablet (50 mg total) by mouth at bedtime. Total of 150 mg at night. Take along with 100 mg tab 90 tablet 0   tamsulosin (FLOMAX) 0.4 MG CAPS capsule Take 1 capsule (0.4 mg total) by mouth in the morning and at bedtime. 60 capsule 11   traZODone (DESYREL) 50 MG tablet Take 1 tablet (50 mg total) by mouth at bedtime as needed for sleep. (Patient not taking: Reported on 07/15/2023) 90 tablet 1   No current facility-administered medications for this visit.     Musculoskeletal: Strength & Muscle Tone: within normal limits Gait & Station: normal Patient leans: N/A  Psychiatric Specialty Exam: Review of Systems  Psychiatric/Behavioral:  Positive for decreased concentration, dysphoric mood and sleep disturbance. Negative for agitation, behavioral problems, confusion,  hallucinations, self-injury and suicidal ideas. The patient is nervous/anxious. The patient is not hyperactive.   All other systems reviewed and are negative.   Blood pressure 136/72, pulse 66, temperature (!) 97.1 F (36.2 C), temperature source Skin, height 6\' 2"  (1.88 m), weight 185 lb (83.9 kg).Body mass index is 23.75 kg/m.  General Appearance: Well Groomed  Eye Contact:  Good  Speech:   low volume, slightly increased speech latency- improving  Volume:  Normal  Mood:  Anxious  Affect:  Appropriate, Congruent, and slightly down, but reactive  Thought Process:  Coherent  Orientation:  Full (Time, Place, and Person)  Thought Content: Logical   Suicidal Thoughts:  No  Homicidal Thoughts:  No  Memory:  Immediate;   Good  Judgement:  Good  Insight:  Good  Psychomotor Activity:  Normal, Normal tone, no rigidity, no resting/postural tremors, no tardive dyskinesia    Concentration:  Concentration: Good and Attention Span: Good  Recall:  Good  Fund of Knowledge: Good  Language: Good  Akathisia:  No  Handed:  Right  AIMS (if indicated): not done  Assets:  Communication Skills Desire for Improvement  ADL's:  Intact  Cognition: WNL  Sleep:  Good   Screenings: AIMS    Flowsheet Row Admission (Discharged) from 04/11/2022 in [facility]  AIMS Total Score 6      AUDIT    Flowsheet Row Admission (Discharged) from 07/04/2022 in [facility] Admission (Discharged) from 04/04/2022 in [facility] BEHAVIORAL MEDIC[facility] Admission (Discharged) from 03/13/2022 in [facility] BE[facility] MEDIC[facility]  Alcohol Use Disorder Identification Test Final Score (AUDIT) 1 0 0      ECT-MADRS    Flowsheet Row ECT Treatment from 09/06/2022 in [facility] Admission (Discharged) from 03/13/2022 in [facility] MED[facility]  MADRS Total  Score 13 42      GAD-7    Flowsheet Row Office Visit from 05/20/2023 in [facility] Office Visit from 03/11/2023 in [facility] Office Visit from 12/02/2022 in [facility] Office Visit from 10/17/2022 in [facility] Office Visit from 08/26/2022 in [facility]  Total GAD-7 Score 3 9 12 6 12       Mini-Mental    Flowsheet Row ECT Treatment from 08/16/2022 in [facility] Admission (Discharged) from 03/13/2022 in [facility] BE[facility]AL MEDICINE  Total Score (max 30 points ) 30 22      PHQ2-9    Flowsheet Row Office Visit from 09/09/2023 in [facility] Office Visit from 07/15/2023 in [facility] Office Visit from 05/20/2023 in [facility] Office Visit from 03/11/2023 in [facility] Office Visit from 12/02/2022 in [facility]  PHQ-2 Total Score 2 4 2 2 4   PHQ-9 Total Score 7 8 8 5 13       Flowsheet Row Office Visit from 07/15/2023 in [facility] ED to Hosp-Admission (Discharged) from 06/25/2023 in [facility] (1A) Office Visit from 12/02/2022 in [facility]  C-SSRS RISK CATEGORY Error: Question 6 not populated No Risk No Risk        Assessment and Plan:  Wylliam Hanek is a 82 y.o. year old male with a history of depression, diabetes, hypertension, PAfib, sleep apnea, subdural hematoma, epidural hematoma in 1958,  seizure disorder (no episode for the past five years, not on anticonvulsant), hip fracture,  right femur after mechanical fall s/p right hip arthroplasty on 6/11, who presents for follow up appointment for below.   1. MDD (major  depressive disorder), recurrent episode, mild (HCC) 2. Anxiety disorder, unspecified type Acute stressors include: concern about the rental property, financial strain, hip fracture, newly found Afib,recent admission due to osteomyelitis  Other stressors include: head injury as a teenager s/p removal of the hematoma   History:good response to ECT 2023 (concern about memory loss), admitted to [facility] in May 2023 for depression, SI, and Sept 2023 for depression (initially confused and non verbal).    Exam is notable for brighter affect, and less rumination on somatic symptoms, although he continues to have increased speech latency.  Will taper down olanzapine to avoid its possible adverse reaction on his cognition.  He was advised to contact the office if any worsening in his mood symptoms.  Will continue sertraline to target depression and anxiety.  Will continue bupropion for depression. Will not uptitrate bupropion further given his history of seizure; the dose has been maintained at the current dose since his recent admission.  He will greatly benefit from CBT; he will continue to see his therapist.   3. Cognitive impairment Functional Status   IADL: Independent in the following:            Requires assistance with the following: managing finances, medications, driving ADL  Independent in the following: bathing and hygiene, feeding, continence, grooming and toileting, walking (walker)          Requires assistance with the following: Folate, Vitamin B12 255 01/2023,  (TSH 11/2022 wnl) Images: Head MRI 07/2022- 1. No acute intracranial abnormality. 2. Progressed chronic small vessel  disease since a 2016 MRI, including fairly numerous chronic micro hemorrhages in both thalami. 3. Prior left frontal craniotomy/cranioplasty with subtle chronic encephalomalacia of underlying frontal gyri. Neuropsych assessment: SLUMS 15/30 06/2022, MOCA 20/30 03/11/2023, mainly  had issues in visuospatial, delayed recall (able  to recall with cue 2/5) Etiology: r/o vascular, depression, s/p ECT, TBI (h/o epidural hematoma s/p craniotomy in 1958)  There has been slight improvement in paucity of thoughts.  Etiology is likely secondary to s/p surgery/admission r/o delirium on top of other etiology as outlined above.  Will continue to assist.   4. Insomnia, unspecified type Improving despite discontinuation of trazodone.  Will continue current dose of olanzapine to target his mood symptoms, and also for insomnia, off label.     Plan  Continue sertraline 150 mg at night  Continue bupropion 300 mg daily  Decrease olanzapine 5 mg at night  (HR 65, QTc 434 msec, first degree AV 09/2022 (tapered down from 10 mg, 08/2023) Next appointment: 1/9 at 3:30 for 30 mins, IP - he sees a therapist, Delma Post, in [place]  The patient demonstrates the following risk factors for suicide: Chronic risk factors for suicide include: psychiatric disorder of depression and previous suicide attempts of putting a bag over his head many years ago, but then he took it off. . Acute risk factors for suicide include: unemployment and loss (financial, interpersonal, professional). Protective factors for this patient include: positive social support and hope for the future. Considering these factors, the overall suicide risk at this point appears to be low. Patient is appropriate for outpatient follow up.   Collaboration of Care: Collaboration of Care: Other reviewed notes in Epic  Patient/Guardian was advised Release of Information must be obtained prior to any record release in order to collaborate their care with an outside provider. Patient/Guardian was advised if they have not already done so to contact the registration department to sign all necessary forms in order for [place] to release information regarding their care.   Consent: Patient/Guardian gives verbal consent for treatment and assignment of benefits for services provided during this visit.  Patient/Guardian expressed understanding and agreed to proceed.    Neysa Hotter, MD 09/09/2023, 1:43 PM

## 2023-09-09 ENCOUNTER — Ambulatory Visit (INDEPENDENT_AMBULATORY_CARE_PROVIDER_SITE_OTHER): Payer: Medicare Other | Admitting: Psychiatry

## 2023-09-09 ENCOUNTER — Encounter: Payer: Self-pay | Admitting: Psychiatry

## 2023-09-09 VITALS — BP 136/72 | HR 66 | Temp 97.1°F | Ht 74.0 in | Wt 185.0 lb

## 2023-09-09 DIAGNOSIS — F330 Major depressive disorder, recurrent, mild: Secondary | ICD-10-CM | POA: Diagnosis not present

## 2023-09-09 DIAGNOSIS — G4700 Insomnia, unspecified: Secondary | ICD-10-CM

## 2023-09-09 DIAGNOSIS — R4189 Other symptoms and signs involving cognitive functions and awareness: Secondary | ICD-10-CM | POA: Diagnosis not present

## 2023-09-09 DIAGNOSIS — F419 Anxiety disorder, unspecified: Secondary | ICD-10-CM

## 2023-09-09 NOTE — Patient Instructions (Signed)
Continue sertraline 150 mg at night  Continue bupropion 300 mg daily  Decrease olanzapine 5 mg at night   Next appointment: 1/9 at 3:30

## 2023-09-10 ENCOUNTER — Other Ambulatory Visit: Payer: Self-pay | Admitting: Psychiatry

## 2023-09-10 ENCOUNTER — Telehealth: Payer: Self-pay

## 2023-09-10 MED ORDER — OLANZAPINE 5 MG PO TABS: 5.0000 mg | ORAL_TABLET | Freq: Every day | ORAL | 0 refills | Status: DC

## 2023-09-10 NOTE — Telephone Encounter (Signed)
called pt to notify that rx was sent in for the 5mg . but his wife states that he has a couple bottles of the 10mg  can he just take one pill every other day . Instead of wasting the 10mg 

## 2023-09-10 NOTE — Telephone Encounter (Signed)
Ordered 5 mg tab. Please advise him to take this instead of 10 mg tab.

## 2023-09-10 NOTE — Telephone Encounter (Signed)
Pt.notified

## 2023-09-10 NOTE — Telephone Encounter (Signed)
If they prefer it, it is fine.

## 2023-09-10 NOTE — Telephone Encounter (Signed)
pt states that you told him to cut the olanzapine in 1/2 but when he was reading about it he states that the medication is not supposed to be cut   Pt was seen yesterday next appt 1-9

## 2023-09-23 ENCOUNTER — Other Ambulatory Visit: Payer: Self-pay | Admitting: Psychiatry

## 2023-09-23 ENCOUNTER — Telehealth: Payer: Self-pay

## 2023-09-23 MED ORDER — SERTRALINE HCL 50 MG PO TABS: 50.0000 mg | ORAL_TABLET | Freq: Every day | ORAL | 1 refills | Status: DC

## 2023-09-23 NOTE — Telephone Encounter (Signed)
Received fax from [facility] requesting a refill for the following medication please advise   sertraline (ZOLOFT) 50 MG tablet   Last visit 09/09/23 Next visit 11/06/23

## 2023-09-23 NOTE — Telephone Encounter (Signed)
Ordered

## 2023-10-23 ENCOUNTER — Other Ambulatory Visit: Payer: Self-pay | Admitting: Psychiatry

## 2023-10-24 ENCOUNTER — Other Ambulatory Visit (HOSPITAL_COMMUNITY): Payer: Self-pay | Admitting: *Deleted

## 2023-10-24 ENCOUNTER — Telehealth: Payer: Self-pay

## 2023-10-24 MED ORDER — SERTRALINE HCL 100 MG PO TABS: 100.0000 mg | ORAL_TABLET | Freq: Every day | ORAL | 0 refills | Status: DC

## 2023-10-24 MED ORDER — SERTRALINE HCL 50 MG PO TABS: 50.0000 mg | ORAL_TABLET | Freq: Every day | ORAL | 0 refills | Status: DC

## 2023-10-24 NOTE — Telephone Encounter (Signed)
Received fax from pharmacy requesting a refill for the following medication please advise  sertraline (ZOLOFT) 100 MG tablet   Last visit 09-09-23 Next visit 1+-9-25  Preferred pharmacy [id number] - [place], [place] - [address] Phone: [phone]  Fax: [phone]

## 2023-11-02 NOTE — Progress Notes (Signed)
 [facility] MD/PA/NP OP Progress Note  11/06/2023 3:47 PM John [name]  MRN:  [id number]  Chief Complaint:  Chief Complaint  Patient presents with   Follow-up   HPI:  This is a follow-up appointment for depression, anxiety and insomnia.  He is states that he has good moments and bad moments.  When he has good movement, he notes that he is smiling, and he is animated.  He saw a therapist the other time, and he was doing the clock most of her time while his wife was present.  He wishes that this would last, that it would last for a few hours.  When he has been at moments, he does not have a lot of energy.  He has been getting out of touch with people for the past several months.  Although he used to have meetings at [employer] and meet group of friends, he does not feel like doing these.  He avoids meeting with people.  He was stressed during Christmas when he had a visit from his son.  He feels that his son was trying to make him feel better.  He agrees that it has been positive changes that he has good movement, which has not happened for a while.  He is willing to do more things to make him feel better/enjoy his day. The patient has mood symptoms as in PHQ-9/GAD-7.  He sleeps 8 hours.  He has good appetite.  He denies SI.  He has been taking olanzapine  10 mg every other day as he noticed that he has plenty of them.   Niels, his wife presents to the visit.  She states that people are commenting that he has been more animated.   Support: daughter Household: wife  Marital status: married for more than 50 years Number of children: Employment:  Chemical Engineer Readings from Last 3 Encounters:  11/06/23 195 lb (88.5 kg)  09/09/23 185 lb (83.9 kg)  07/15/23 174 lb (78.9 kg)    Visit Diagnosis: No diagnosis found.  Past Psychiatric History: Please see initial evaluation for full details. I have reviewed the history. No updates at this time.     Past Medical History:  Past Medical History:   Diagnosis Date   Cancer (HCC)    Depression    Depression    Diabetes mellitus without complication (HCC)    type 2   Dissection of carotid artery (HCC)    a. 06/2015 CT Head: abnl thickening of mid-dist R cervical ICA w/o narrowing - ? vasculitis and thrombosed/healing dissection; b. 01/2021 Carotid U/S: 1-39% bilat ICA stenoses.   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 Age 62 - struck in head w/ baseball --> s/p craniotomy.   Epidural hematoma (HCC)    H/O Osgood-Schlatter disease    Hand deformity, congenital    Hearing loss bilateral   Hemorrhoids    Horner syndrome    Hypertension    Migraine    Obesity    PONV (postoperative nausea and vomiting)    Prostate cancer (HCC) 2020   Rosacea    SDH (subdural hematoma) (HCC)    Sleep apnea    Spontaneous Subdural hematoma (HCC)    a. Approx 2015 - eval in [place] - conservatively managed.    Past Surgical History:  Procedure Laterality Date   ANKLE ARTHROSCOPY Left    fracture   CHOLECYSTECTOMY  2007   CRANIOTOMY  1958  left frontal craniotomy for epidural hematoma   HIP ARTHROPLASTY Right 04/07/2022   Procedure: ARTHROPLASTY BIPOLAR HIP (HEMIARTHROPLASTY);  Surgeon: Cleotilde Barrio, MD;  Location: [facility] OR[facility];  Service: Orthopedics;  Laterality: Right;   INSERTION OF MESH  01/02/2022   Procedure: INSERTION OF MESH;  Surgeon: Rodolph Romano, MD;  Location: [facility];  Service: General;;   IRRIGATION AND DEBRIDEMENT FOOT Left 06/27/2023   Procedure: IRRIGATION AND DEBRIDEMENT FOOT;  Surgeon: Ashley Soulier, DPM;  Location: [facility];  Service: Orthopedics/Podiatry;  Laterality: Left;   LIGAMENT REPAIR Right    thumb   METATARSAL HEAD EXCISION Left 06/27/2023   Procedure: EXCISION OF BONE IN GREAT TOE JOINT;  Surgeon: Ashley Soulier, DPM;  Location: [facility];  Service: Orthopedics/Podiatry;  Laterality: Left;   RETINAL DETACHMENT SURGERY     TREATMENT FISTULA ANAL  2006    Family Psychiatric History: Please see  initial evaluation for full details. I have reviewed the history. No updates at this time.     Family History:  Family History  Problem Relation Age of Onset   Osteoporosis Mother    Depression Mother    Prostate cancer Father    Hypertension Father    Diabetes Brother    Kidney disease Neg Hx     Social History:  Social History   Socioeconomic History   Marital status: Married    Spouse name: Niels   Number of children: 2   Years of education: Not on file   Highest education level: Master's degree (e.g., MA, MS, MEng, MEd, MSW, MBA)  Occupational History   Not on file  Tobacco Use   Smoking status: Never   Smokeless tobacco: Never  Vaping Use   Vaping status: Never Used  Substance and Sexual Activity   Alcohol use: Not Currently   Drug use: No   Sexual activity: Yes    Birth control/protection: None  Other Topics Concern   Not on file  Social History Narrative   Live at home with wife.    Social Drivers of Corporate Investment Banker Strain: Low Risk  (11/03/2023)   Received from [facility]   Overall Financial Resource Strain (CARDIA)    Difficulty of Paying Living Expenses: Not hard at all  Food Insecurity: No Food Insecurity (11/03/2023)   [facility]   Hunger Vital Sign    Worried About Running Out of Food in the Last Year: Never true    Ran Out of Food in the Last Year: Never true  Transportation Needs: No Transportation Needs (11/03/2023)   Received from [facility] - Transportation    In the past 12 months, has lack of transportation kept you from medical appointments or from getting medications?: No    Lack of Transportation (Non-Medical): No  Physical Activity: Not on file  Stress: Not on file  Social Connections: Not on file    Allergies: No Known Allergies  Metabolic Disorder Labs: Lab Results  Component Value Date   HGBA1C 5.6 03/19/2022   MPG 114.02 03/19/2022    No results found for: PROLACTIN Lab Results  Component Value Date   CHOL 131 03/19/2022   TRIG 92 03/19/2022   HDL 33 (L) 03/19/2022   CHOLHDL 4.0 03/19/2022   VLDL 18 03/19/2022   LDLCALC 80 03/19/2022   Lab Results  Component Value Date   TSH 0.766 04/01/2022    Therapeutic Level Labs: No results found for: LITHIUM No results  found for: VALPROATE No results found for: CBMZ  Current Medications: Current Outpatient Medications  Medication Sig Dispense Refill   acetaminophen  (TYLENOL ) 325 MG tablet Take 2 tablets (650 mg total) by mouth every 6 (six) hours as needed for mild pain (or Fever >/= 101).     aspirin  EC 81 MG tablet Take 81 mg by mouth daily.     buPROPion  (WELLBUTRIN  XL) 300 MG 24 hr tablet Take 1 tablet (300 mg total) by mouth daily. 90 tablet 1   docusate sodium  (COLACE) 100 MG capsule Take 1 capsule (100 mg total) by mouth 2 (two) times daily. 60 capsule 3   OLANZapine  (ZYPREXA ) 10 MG tablet Take 1 tablet (10 mg total) by mouth at bedtime. 90 tablet 0   OLANZapine  (ZYPREXA ) 5 MG tablet Take 1 tablet (5 mg total) by mouth at bedtime. 90 tablet 0   polyethylene glycol (MIRALAX  / GLYCOLAX ) 17 g packet Take 17 g by mouth daily as needed for moderate constipation. 30 each 0   sertraline  (ZOLOFT ) 100 MG tablet Take 1 tablet (100 mg total) by mouth at bedtime. Total of 150 mg at night. Take along with 50 mg tab 30 tablet 0   sertraline  (ZOLOFT ) 50 MG tablet Take 1 tablet (50 mg total) by mouth at bedtime. Total of 150 mg at night. Take along with 100 mg tab 30 tablet 0   tamsulosin  (FLOMAX ) 0.4 MG CAPS capsule Take 1 capsule (0.4 mg total) by mouth in the morning and at bedtime. 60 capsule 11   tiZANidine (ZANAFLEX) 2 MG tablet Take 2 mg by mouth every 6 (six) hours.     traZODone  (DESYREL ) 50 MG tablet Take 1 tablet (50 mg total) by mouth at bedtime as needed for sleep. 90 tablet 1   No current facility-administered medications for this visit.      Musculoskeletal: Strength & Muscle Tone: within normal limits Gait & Station: normal Patient leans: N/A  Psychiatric Specialty Exam: Review of Systems  Blood pressure (!) 150/83, pulse 72, temperature 98.1 F (36.7 C), temperature source Skin, height 6' 2 (1.88 m), weight 195 lb (88.5 kg).Body mass index is 25.04 kg/m.  General Appearance: Well Groomed  Eye Contact:  Good  Speech:  Clear and Coherent  Volume:  Normal  Mood:   good and bad  Affect:  Appropriate, Congruent, and brighter  Thought Process:  Coherent  Orientation:  Full (Time, Place, and Person)  Thought Content: Logical   Suicidal Thoughts:  No  Homicidal Thoughts:  No  Memory:  Immediate;   Good  Judgement:  Good  Insight:  Good  Psychomotor Activity:  Normal, Normal tone, no rigidity, no resting/postural tremors, no tardive dyskinesia    Concentration:  Concentration: Good and Attention Span: Good  Recall:  Good  Fund of Knowledge: Good  Language: Good  Akathisia:  No  Handed:  Right  AIMS (if indicated): not done  Assets:  Communication Skills Desire for Improvement  ADL's:  Intact  Cognition: WNL  Sleep:  Good   Screenings: AIMS    Flowsheet Row Admission (Discharged) from 04/11/2022 in [facility] BE[facility]IORAL MEDICINE  AIMS Total Score 6      AUDIT    Flowsheet Row Admission (Discharged) from 07/04/2022 in [facility] BEH[facility]IORAL MEDIC[facility] Admission (Discharged) from 04/04/2022 in [facility]AL MEDICINE Admission (Discharged) from 03/13/2022 in [facility] BEHAVIORAL MEDICINE  Alcohol Use Disorder Identification Test Final Score (AUDIT) 1 0 0      ECT-MADRS  Flowsheet Row ECT Treatment from 09/06/2022 in [facility] Admission (Discharged) from 03/13/2022 in [facility] BEHAVIORAL MEDICINE  MADRS Total Score 13 42      GAD-7    Flowsheet Row Office Visit from 05/20/2023 in [facility] Office  Visit from 03/11/2023 in [facility] Office Visit from 12/02/2022 in [facility] Office Visit from 10/17/2022 in [facility] Office Visit from 08/26/2022 in [facility]  Total GAD-7 Score 3 9 12 6 12       Mini-Mental    Flowsheet Row ECT Treatment from 08/16/2022 in [facility] Admission (Discharged) from 03/13/2022 in [facility] BEHAVIORAL MEDICINE  Total Score (max 30 points ) 30 22      PHQ2-9    Flowsheet Row Office Visit from 09/09/2023 in [facility] Associates Office Visit from 07/15/2023 in [facility] Psych[facility] Associates Office Visit from 05/20/2023 in [facility] Office Visit from 03/11/2023 in [facility] Office Visit from 12/02/2022 in [facility]  PHQ-2 Total Score 2 4 2 2 4   PHQ-9 Total Score 7 8 8 5 13       Flowsheet Row Office Visit from 07/15/2023 in [facility] ED to Hosp-Admission (Discharged) from 06/25/2023 in [facility] (1A) Office Visit from 12/02/2022 in [facility]  C-SSRS RISK CATEGORY Error: Question 6 not populated No Risk No Risk        Assessment and Plan:  John [name] is a 83 y.o. year old male with a history of depression, diabetes, hypertension, PAfib, sleep apnea, subdural hematoma, epidural hematoma in 1958,  seizure disorder (no episode for the past five years, not on anticonvulsant), hip fracture,  right femur after mechanical fall s/p right hip arthroplasty on 6/11, who presents for follow up appointment for below.   1. MDD (major depressive disorder), recurrent episode, mild  (HCC) 2. Anxiety disorder, unspecified type Acute stressors include: concern about the rental property, financial strain, hip fracture, newly found Afib,recent admission due to osteomyelitis  Other stressors include: head injury as a teenager s/p removal of the hematoma   History:good response to ECT 2023 (concern about memory loss), admitted to [facility] in May 2023 for depression, SI, and Sept 2023 for depression (initially confused and non verbal).    Exam is notable for brighter affect, less increase in speech latency, and he reports slight improvement in his mood symptoms since the last visit.  He has been taking olanzapine  10 mg every other day to use up his medication prior to reduce the dose, while he is willing to do this eventually.  Will proceed with tapering down the dose after he finishes up 10 mg to mitigate long-term risk.  Will continue sertraline  to target depression and anxiety, along with bupropion  as adjunctive treatment for depression.  Noted that he has been maintained on the current dose since his last admission, while he has history of seizure.  He will greatly benefit from behavioral activation and CBT; he will continue to see his therapist.   3. Cognitive impairment Functional Status   IADL: Independent in the following:            Requires assistance with the following: managing finances, medications, driving ADL  Independent  in the following: bathing and hygiene, feeding, continence, grooming and toileting, walking (walker)          Requires assistance with the following: Folate, Vitamin B12 255 01/2023,  (TSH 11/2022 wnl) Images: Head MRI 07/2022- 1. No acute intracranial abnormality. 2. Progressed chronic small vessel disease since a 2016 MRI, including fairly numerous chronic micro hemorrhages in both thalami. 3. Prior left frontal craniotomy/cranioplasty with subtle chronic encephalomalacia of underlying frontal gyri. Neuropsych assessment: SLUMS 15/30 06/2022, MOCA 20/30  03/11/2023, mainly  had issues in visuospatial, delayed recall (able to recall with cue 2/5) Etiology: r/o vascular, depression, s/p ECT, TBI (h/o epidural hematoma s/p craniotomy in 1958)  There has steady improvement in paucity of thoughts.  Etiology is likely secondary to s/p surgery/admission r/o delirium on top of other etiology as outlined above.  Will continue to assist.    4. Insomnia, unspecified type Improving despite discontinuation of trazodone .  Will continue current dose of olanzapine  to target his mood symptoms, and also for insomnia, off label.     Plan  Continue sertraline  150 mg at night  Continue bupropion  300 mg daily  Decrease olanzapine  5 mg at night  (HR 65, QTc 434 msec, first degree AV 09/2022 (tapered down from 10 mg, 08/2023) Next appointment: 2/27 at 11:30, IP - he sees a therapist, Rico Kato, in [place] every other way   The patient demonstrates the following risk factors for suicide: Chronic risk factors for suicide include: psychiatric disorder of depression and previous suicide attempts of putting a bag over his head many years ago, but then he took it off. . Acute risk factors for suicide include: unemployment and loss (financial, interpersonal, professional). Protective factors for this patient include: positive social support and hope for the future. Considering these factors, the overall suicide risk at this point appears to be low. Patient is appropriate for outpatient follow up.   Collaboration of Care: Collaboration of Care: Other reviewed notes in Epic  Patient/Guardian was advised Release of Information must be obtained prior to any record release in order to collaborate their care with an outside provider. Patient/Guardian was advised if they have not already done so to contact the registration department to sign all necessary forms in order for us  to release information regarding their care.   Consent: Patient/Guardian gives verbal consent for  treatment and assignment of benefits for services provided during this visit. Patient/Guardian expressed understanding and agreed to proceed.    Katheren Sleet, MD 11/06/2023, 3:47 PM

## 2023-11-06 ENCOUNTER — Encounter: Payer: Self-pay | Admitting: Psychiatry

## 2023-11-06 ENCOUNTER — Ambulatory Visit (INDEPENDENT_AMBULATORY_CARE_PROVIDER_SITE_OTHER): Payer: Medicare Other | Admitting: Psychiatry

## 2023-11-06 VITALS — BP 150/83 | HR 72 | Temp 98.1°F | Ht 74.0 in | Wt 195.0 lb

## 2023-11-06 DIAGNOSIS — F330 Major depressive disorder, recurrent, mild: Secondary | ICD-10-CM | POA: Diagnosis not present

## 2023-11-06 DIAGNOSIS — F419 Anxiety disorder, unspecified: Secondary | ICD-10-CM | POA: Diagnosis not present

## 2023-11-06 DIAGNOSIS — R4189 Other symptoms and signs involving cognitive functions and awareness: Secondary | ICD-10-CM

## 2023-11-06 MED ORDER — SERTRALINE HCL 100 MG PO TABS: 100.0000 mg | ORAL_TABLET | Freq: Every day | ORAL | 5 refills | Status: DC

## 2023-11-06 MED ORDER — SERTRALINE HCL 50 MG PO TABS: 50.0000 mg | ORAL_TABLET | Freq: Every day | ORAL | 5 refills | Status: DC

## 2023-11-06 NOTE — Patient Instructions (Signed)
 Continue sertraline 150 mg at night  Continue bupropion 300 mg daily  Decrease olanzapine 5 mg at night   Next appointment: 2/27 at 11:30

## 2023-12-21 NOTE — Progress Notes (Unsigned)
 [facility] MD/PA/NP OP Progress Note  12/25/2023 12:04 PM John [name]  MRN:  [id number]  Chief Complaint:  Chief Complaint  Patient presents with   Follow-up   HPI:  - he is seen by Dr. Terance Hart 10/2023. According to the chart review, he was scheduled for sleep study next month.   This is a follow up appointment for depression, anxiety.  He states that he has been trying to work on tasks, and reading news.  He feels a little overwhelmed at times for things he needs to get done.  He is concerned about the property, which they are trying to rent.  His brother in [place] is helping him.  He feels somewhat depressed at times.  However, he agrees that his communication has been better, although he still has some difficulty especially when he is anxious.  He has been working on planning for end-of-life.  He continues to see a therapist.  He had dream, and has occasional middle insomnia due to nocturia.  He denies change in appetite.  He denies SI.  Although he occasionally feels like oozy, he is unsure if this is related to changing olanzapine dose.  He denies hallucinations.  He has occasional issues with gait, although he denies dizziness.  He agrees to discuss this with his primary care provider.   Wt Readings from Last 3 Encounters:  12/25/23 194 lb (88 kg)  11/06/23 195 lb (88.5 kg)  09/09/23 185 lb (83.9 kg)     Visit Diagnosis:    ICD-10-CM   1. MDD (major depressive disorder), recurrent episode, mild (HCC)  F33.0     2. Anxiety disorder, unspecified type  F41.9     3. Cognitive impairment  R41.89     4. Insomnia, unspecified type  G47.00       Past Psychiatric History: Please see initial evaluation for full details. I have reviewed the history. No updates at this time.     Past Medical History:  Past Medical History:  Diagnosis Date   Cancer (HCC)    Depression    Depression    Diabetes mellitus without complication (HCC)    type 2   Dissection of carotid artery  (HCC)    a. 06/2015 CT Head: abnl thickening of mid-dist R cervical ICA w/o narrowing - ? vasculitis and thrombosed/healing dissection; b. 01/2021 Carotid U/S: 1-39% bilat ICA stenoses.   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 Age 60 - struck in head w/ baseball --> s/p craniotomy.   Epidural hematoma (HCC)    H/O Osgood-Schlatter disease    Hand deformity, congenital    Hearing loss bilateral   Hemorrhoids    Horner syndrome    Hypertension    Migraine    Obesity    PONV (postoperative nausea and vomiting)    Prostate cancer (HCC) 2020   Rosacea    SDH (subdural hematoma) (HCC)    Sleep apnea    Spontaneous Subdural hematoma (HCC)    a. Approx 2015 - eval in [place] - conservatively managed.    Past Surgical History:  Procedure Laterality Date   ANKLE ARTHROSCOPY Left    fracture   CHOLECYSTECTOMY  2007   CRANIOTOMY  1958   left frontal craniotomy for epidural hematoma   HIP ARTHROPLASTY Right 04/07/2022   Procedure: ARTHROPLASTY BIPOLAR HIP (HEMIARTHROPLASTY);  Surgeon: Deeann Saint, MD;  Location: [facility] OR[facility];  Service: Orthopedics;  Laterality: Right;   INSERTION OF MESH  01/02/2022   Procedure: INSERTION OF MESH;  Surgeon: Carolan Shiver, MD;  Location: [facility];  Service: General;;   IRRIGATION AND DEBRIDEMENT FOOT Left 06/27/2023   Procedure: IRRIGATION AND DEBRIDEMENT FOOT;  Surgeon: Gwyneth Revels, DPM;  Location: [facility];  Service: Orthopedics/Podiatry;  Laterality: Left;   LIGAMENT REPAIR Right    thumb   METATARSAL HEAD EXCISION Left 06/27/2023   Procedure: EXCISION OF BONE IN GREAT TOE JOINT;  Surgeon: Gwyneth Revels, DPM;  Location: [facility];  Service: Orthopedics/Podiatry;  Laterality: Left;   RETINAL DETACHMENT SURGERY     TREATMENT FISTULA ANAL  2006    Family Psychiatric History: Please see initial evaluation for full details. I have reviewed the history. No updates at this time.     Family History:  Family History  Problem Relation Age  of Onset   Osteoporosis Mother    Depression Mother    Prostate cancer Father    Hypertension Father    Diabetes Brother    Kidney disease Neg Hx     Social History:  Social History   Socioeconomic History   Marital status: Married    Spouse name: Okey Regal   Number of children: 2   Years of education: Not on file   Highest education level: Master's degree (e.g., MA, MS, MEng, MEd, MSW, MBA)  Occupational History   Not on file  Tobacco Use   Smoking status: Never   Smokeless tobacco: Never  Vaping Use   Vaping status: Never Used  Substance and Sexual Activity   Alcohol use: Not Currently   Drug use: No   Sexual activity: Yes    Birth control/protection: None  Other Topics Concern   Not on file  Social History Narrative   Live at home with wife.    Social Drivers of Corporate investment banker Strain: Low Risk  (12/11/2023)   Received from [facility]   Overall Financial Resource Strain (CARDIA)    Difficulty of Paying Living Expenses: Not very hard  Food Insecurity: No Food Insecurity (12/11/2023)   Received from [facility]   Hunger Vital Sign    Worried About Running Out of Food in the Last Year: Never true    Ran Out of Food in the Last Year: Never true  Transportation Needs: No Transportation Needs (12/11/2023)   Received from [facility] - Transportation    In the past 12 months, has lack of transportation kept you from medical appointments or from getting medications?: No    Lack of Transportation (Non-Medical): No  Physical Activity: Not on file  Stress: Not on file  Social Connections: Not on file    Allergies: No Known Allergies  Metabolic Disorder Labs: Lab Results  Component Value Date   HGBA1C 5.6 03/19/2022   MPG 114.02 03/19/2022   No results found for: "PROLACTIN" Lab Results  Component Value Date   CHOL 131 03/19/2022   TRIG 92 03/19/2022   HDL 33 (L) 03/19/2022   CHOLHDL 4.0  03/19/2022   VLDL 18 03/19/2022   LDLCALC 80 03/19/2022   Lab Results  Component Value Date   TSH 0.766 04/01/2022    Therapeutic Level Labs: No results found for: "LITHIUM" No results found for: "VALPROATE" No results found for: "CBMZ"  Current Medications: Current Outpatient Medications  Medication Sig Dispense Refill   acetaminophen (TYLENOL) 325 MG tablet Take 2 tablets (650 mg total) by mouth every 6 (six) hours as needed for  mild pain (or Fever >/= 101).     aspirin EC 81 MG tablet Take 81 mg by mouth daily.     buPROPion (WELLBUTRIN XL) 300 MG 24 hr tablet Take 1 tablet (300 mg total) by mouth daily. 90 tablet 1   docusate sodium (COLACE) 100 MG capsule Take 1 capsule (100 mg total) by mouth 2 (two) times daily. 60 capsule 3   polyethylene glycol (MIRALAX / GLYCOLAX) 17 g packet Take 17 g by mouth daily as needed for moderate constipation. 30 each 0   sertraline (ZOLOFT) 100 MG tablet Take 1 tablet (100 mg total) by mouth at bedtime. Total of 150 mg at night. Take along with 50 mg tab 30 tablet 5   sertraline (ZOLOFT) 50 MG tablet Take 1 tablet (50 mg total) by mouth at bedtime. Total of 150 mg at night. Take along with 100 mg tab 30 tablet 5   tamsulosin (FLOMAX) 0.4 MG CAPS capsule Take 1 capsule (0.4 mg total) by mouth in the morning and at bedtime. 60 capsule 11   tiZANidine (ZANAFLEX) 2 MG tablet Take 2 mg by mouth every 6 (six) hours.     OLANZapine (ZYPREXA) 5 MG tablet Take 1 tablet (5 mg total) by mouth at bedtime. 90 tablet 0   No current facility-administered medications for this visit.     Musculoskeletal: Strength & Muscle Tone: within normal limits Gait & Station: normal Patient leans: N/A  Psychiatric Specialty Exam: Review of Systems  Psychiatric/Behavioral:  Positive for decreased concentration, dysphoric mood and sleep disturbance. Negative for agitation, behavioral problems, confusion, hallucinations, self-injury and suicidal ideas. The patient is  nervous/anxious. The patient is not hyperactive.   All other systems reviewed and are negative.   Blood pressure 118/84, pulse 84, temperature 98.4 F (36.9 C), temperature source Temporal, height 6\' 2"  (1.88 m), weight 194 lb (88 kg), SpO2 99%.Body mass index is 24.91 kg/m.  General Appearance: Well Groomed  Eye Contact:  Good  Speech:  Clear and Coherent, less increase in speech latency  Volume:  Normal  Mood:   overwhelmed  Affect:  Appropriate, Congruent, and less down  Thought Process:  Coherent  Orientation:  Full (Time, Place, and Person)  Thought Content: Logical   Suicidal Thoughts:  No  Homicidal Thoughts:  No  Memory:  Immediate;   Good  Judgement:  Good  Insight:  Good  Psychomotor Activity:  Normal, Normal tone, no rigidity, no resting/postural tremors, no tardive dyskinesia    Concentration:  Concentration: Good and Attention Span: Good  Recall:  Good  Fund of Knowledge: Good  Language: Good  Akathisia:  No  Handed:  Right  AIMS (if indicated):0   Assets:  Communication Skills Desire for Improvement  ADL's:  Intact  Cognition: WNL  Sleep:  Fair   Screenings: AIMS    Flowsheet Row Admission (Discharged) from 04/11/2022 in [facility]  AIMS Total Score 6      AUDIT    Flowsheet Row Admission (Discharged) from 07/04/2022 in [facility] Admission (Discharged) from 04/04/2022 in [facility] Admission (Discharged) from 03/13/2022 in [facility] BE[facility] MEDIC[facility]  Alcohol Use Disorder Identification Test Final Score (AUDIT) 1 0 0      ECT-MADRS    Flowsheet Row ECT Treatment from 09/06/2022 in [facility] Admission (Discharged) from 03/13/2022 in [facility]  MADRS Total Score 13 42      GAD-7    Flowsheet Row  Office Visit from 11/06/2023 in [facility] Office Visit from 05/20/2023 in [facility] Office Visit from 03/11/2023 in [facility] Office Visit from 12/02/2022 in [facility] Office Visit from 10/17/2022 in [facility]  Total GAD-7 Score 5 3 9 12 6       Mini-Mental    Flowsheet Row ECT Treatment from 08/16/2022 in [facility] Admission (Discharged) from 03/13/2022 in [facility]IC[facility]  Total Score (max 30 points ) 30 22      PHQ2-9    Flowsheet Row Office Visit from 11/06/2023 in [facility] Office Visit from 09/09/2023 in [facility] Office Visit from 07/15/2023 in [facility] Office Visit from 05/20/2023 in [facility] Office Visit from 03/11/2023 in [facility]  PHQ-2 Total Score 2 2 4 2 2   PHQ-9 Total Score 6 7 8 8 5       Flowsheet Row Office Visit from 07/15/2023 in [facility] to Hosp-Admission (Discharged) from 06/25/2023 in [facility] (1A) Office Visit from 12/02/2022 in [facility]  C-SSRS RISK CATEGORY Error: Question 6 not populated No Risk No Risk        Assessment and Plan:  John [name] is a 83 y.o. year old male with a history of depression, diabetes, hypertension, PAfib, sleep apnea, subdural hematoma, epidural hematoma in 1958,  seizure disorder (no episode for the past five years, not on anticonvulsant), hip fracture,  right femur after mechanical fall s/p right hip arthroplasty on 6/11, who presents for follow up appointment for below.    1. MDD (major depressive disorder), recurrent episode, mild (HCC) 2. Anxiety disorder,  unspecified type Acute stressors include: concern about the rental property, financial strain, hip fracture, newly found Afib,recent admission due to osteomyelitis  Other stressors include: head injury as a teenager s/p removal of the hematoma   History:good response to ECT 2023 (concern about memory loss), admitted to [facility] in May 2023 for depression, SI, and Sept 2023 for depression (initially confused and non verbal).   Exam is notable for brighter affect , and less increase in speech latency.  There has been steady improvement in depressive symptoms, anxiety.  He has tolerated well to tapering down olanzapine.  Will continue current medication regimen for now, while he remains engaged with therapy.  Will continue sertraline to target depression and anxiety, along with bupropion as adjunctive treatment for depression.  Will continue olanzapine as adjunctive treatment for depression, off label.  Noted that he has been maintained on the current dose of bupropion since his last admission, while he has history of seizure.   3. Cognitive impairment Functional Status   IADL: Independent in the following:            Requires assistance with the following: managing finances, medications, driving ADL  Independent in the following: bathing and hygiene, feeding, continence, grooming and toileting, walking (walker)          Requires assistance with the following: Folate, Vitamin B12 255 01/2023,  (TSH 11/2022 wnl) Images: Head MRI 07/2022- 1. No acute intracranial abnormality. 2. Progressed chronic small vessel disease since a 2016 MRI, including fairly numerous chronic micro hemorrhages in both thalami. 3. Prior left frontal  craniotomy/cranioplasty with subtle chronic encephalomalacia of underlying frontal gyri. Neuropsych assessment: SLUMS 15/30 06/2022, MOCA 20/30 03/11/2023, mainly  had issues in visuospatial, delayed recall (able to recall with cue 2/5) Etiology: r/o vascular, depression, s/p ECT, TBI (h/o  epidural hematoma s/p craniotomy in 1958)  There has steady improvement in paucity of thoughts.  Etiology is likely secondary to s/p surgery/admission r/o delirium on top of other etiology as outlined above.  Will continue to assist.   # Insomnia Overall stable despite discontinuation of trazodone.  Will continue current dose of olanzapine to target his mood symptoms, and insomnia, off label.     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 (Olanzapine reduced 10/2023)  Lipid panels LDL 80 02/2022-Due  HbA1c 5.4 10/2023   Advised to obtain lipid panels at his next PCP visit     Plan  Continue sertraline 150 mg at night  Continue bupropion 300 mg daily  Continue olanzapine 5 mg at night (tapered down from 10 mg, 08/2023) Next appointment:  4/29 at 11 30, IP - he sees a therapist, Delma Post, in [place] every other way   The patient demonstrates the following risk factors for suicide: Chronic risk factors for suicide include: psychiatric disorder of depression and previous suicide attempts of putting a bag over his head many years ago, but then he took it off. . Acute risk factors for suicide include: unemployment and loss (financial, interpersonal, professional). Protective factors for this patient include: positive social support and hope for the future. Considering these factors, the overall suicide risk at this point appears to be low. Patient is appropriate for outpatient follow up.   Collaboration of Care: Collaboration of Care: Other reviewed notes in Epic  Patient/Guardian was advised Release of Information must be obtained prior to any record release in order to collaborate their care with an outside provider. Patient/Guardian was advised if they have not already done so to contact the registration department to sign all necessary forms in order for [place] to release information regarding their care.   Consent: Patient/Guardian gives verbal consent for treatment and  assignment of benefits for services provided during this visit. Patient/Guardian expressed understanding and agreed to proceed.    Neysa Hotter, MD 12/25/2023, 12:04 PM

## 2023-12-25 ENCOUNTER — Encounter: Payer: Self-pay | Admitting: Psychiatry

## 2023-12-25 ENCOUNTER — Ambulatory Visit (INDEPENDENT_AMBULATORY_CARE_PROVIDER_SITE_OTHER): Payer: Medicare Other | Admitting: Psychiatry

## 2023-12-25 VITALS — BP 118/84 | HR 84 | Temp 98.4°F | Ht 74.0 in | Wt 194.0 lb

## 2023-12-25 DIAGNOSIS — R4189 Other symptoms and signs involving cognitive functions and awareness: Secondary | ICD-10-CM | POA: Diagnosis not present

## 2023-12-25 DIAGNOSIS — F330 Major depressive disorder, recurrent, mild: Secondary | ICD-10-CM | POA: Diagnosis not present

## 2023-12-25 DIAGNOSIS — F419 Anxiety disorder, unspecified: Secondary | ICD-10-CM | POA: Diagnosis not present

## 2023-12-25 DIAGNOSIS — G4700 Insomnia, unspecified: Secondary | ICD-10-CM

## 2023-12-25 MED ORDER — BUPROPION HCL ER (XL) 300 MG PO TB24: 300.0000 mg | ORAL_TABLET | Freq: Every day | ORAL | 1 refills | Status: DC

## 2023-12-25 MED ORDER — OLANZAPINE 5 MG PO TABS: 5.0000 mg | ORAL_TABLET | Freq: Every day | ORAL | 0 refills | Status: DC

## 2023-12-25 NOTE — Patient Instructions (Signed)
 Continue sertraline 150 mg at night  Continue bupropion 300 mg daily  Continue olanzapine 5 mg at night  Next appointment:  4/29 at 11 30

## 2024-02-21 NOTE — Progress Notes (Unsigned)
 [facility] MD/PA/NP OP Progress Note  02/24/2024 1:37 PM John [name]  MRN:  [id number]  Chief Complaint:  Chief Complaint  Patient presents with   Follow-up   HPI:  This is a follow-up appointment for depression, anxiety, cognitive impairment and insomnia.  He states that he has been doing pretty well.  He has not felt this way for long time.  He still feels down and anxious and overwhelmed when he wakes up.  He tends to think about things he needs to do.  He has been cutting grass. He feels good going outside.  He reads new on computer, which makes him feel depressed.  His wife will be having a birthday next month.  He has been preparing for paperwork in case of anything happens to both of them for his family.  He agrees that it has been easier to communicate, although he still has difficulty in concentration and memory loss.  He forgot where he put a camera, although he denies any concern of using the camera itself.  He sleeps several hours.  He has occasional incontinence.  He denies SI.  He denies hallucinations or paranoia. Of note, he has been driving short distances around his house as he feels better doing that way. He agrees to do so only when accompanied by others to ensure safety.  Bruises-he reports bruises in his right arm, which is worsened lately. He self lowered aspirin  to half tablet.  He agrees to discuss this with his provider.  Noted that he was also informed of the possible side effect of bleeding tendency from sertraline .   Wt Readings from Last 3 Encounters:  02/24/24 198 lb (89.8 kg)  12/25/23 194 lb (88 kg)  11/06/23 195 lb (88.5 kg)     Visit Diagnosis:    ICD-10-CM   1. MDD (major depressive disorder), recurrent, in partial remission (HCC)  F33.41     2. Anxiety disorder, unspecified type  F41.9     3. Cognitive impairment  R41.89     4. Insomnia, unspecified type  G47.00       Past Psychiatric History: Please see initial evaluation for full details. I  have reviewed the history. No updates at this time.     Past Medical History:  Past Medical History:  Diagnosis Date   Cancer (HCC)    Depression    Depression    Diabetes mellitus without complication (HCC)    type 2   Dissection of carotid artery (HCC)    a. 06/2015 CT Head: abnl thickening of mid-dist R cervical ICA w/o narrowing - ? vasculitis and thrombosed/healing dissection; b. 01/2021 Carotid U/S: 1-39% bilat ICA stenoses.   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 Age 64 - struck in head w/ baseball --> s/p craniotomy.   Epidural hematoma (HCC)    H/O Osgood-Schlatter disease    Hand deformity, congenital    Hearing loss bilateral   Hemorrhoids    Horner syndrome    Hypertension    Migraine    Obesity    PONV (postoperative nausea and vomiting)    Prostate cancer (HCC) 2020   Rosacea    SDH (subdural hematoma) (HCC)    Sleep apnea    Spontaneous Subdural hematoma (HCC)    a. Approx 2015 - eval in [place] - conservatively managed.    Past Surgical History:  Procedure Laterality Date   ANKLE ARTHROSCOPY Left    fracture  CHOLECYSTECTOMY  2007   CRANIOTOMY  1958   left frontal craniotomy for epidural hematoma   HIP ARTHROPLASTY Right 04/07/2022   Procedure: ARTHROPLASTY BIPOLAR HIP (HEMIARTHROPLASTY);  Surgeon: Marlynn Singer, MD;  Location: [facility];  Service: Orthopedics;  Laterality: Right;   INSERTION OF MESH  01/02/2022   Procedure: INSERTION OF MESH;  Surgeon: Eldred Grego, MD;  Location: [facility];  Service: General;;   IRRIGATION AND DEBRIDEMENT FOOT Left 06/27/2023   Procedure: IRRIGATION AND DEBRIDEMENT FOOT;  Surgeon: Anell Baptist, DPM;  Location: [facility];  Service: Orthopedics/Podiatry;  Laterality: Left;   LIGAMENT REPAIR Right    thumb   METATARSAL HEAD EXCISION Left 06/27/2023   Procedure: EXCISION OF BONE IN GREAT TOE JOINT;  Surgeon: Anell Baptist, DPM;  Location: [facility];  Service: Orthopedics/Podiatry;  Laterality: Left;    RETINAL DETACHMENT SURGERY     TREATMENT FISTULA ANAL  2006    Family Psychiatric History: Please see initial evaluation for full details. I have reviewed the history. No updates at this time.     Family History:  Family History  Problem Relation Age of Onset   Osteoporosis Mother    Depression Mother    Prostate cancer Father    Hypertension Father    Diabetes Brother    Kidney disease Neg Hx     Social History:  Social History   Socioeconomic History   Marital status: Married    Spouse name: Sheryle Donning   Number of children: 2   Years of education: Not on file   Highest education level: Master's degree (e.g., MA, MS, MEng, MEd, MSW, MBA)  Occupational History   Not on file  Tobacco Use   Smoking status: Never   Smokeless tobacco: Never  Vaping Use   Vaping status: Never Used  Substance and Sexual Activity   Alcohol use: Not Currently   Drug use: No   Sexual activity: Yes    Birth control/protection: None  Other Topics Concern   Not on file  Social History Narrative   Live at home with wife.    Social Drivers of Corporate investment banker Strain: Low Risk  (12/11/2023)   Received from [facility]   Overall Financial Resource Strain (CARDIA)    Difficulty of Paying Living Expenses: Not very hard  Food Insecurity: No Food Insecurity (12/11/2023)   Received from [facility]   Hunger Vital Sign    Worried About Running Out of Food in the Last Year: Never true    Ran Out of Food in the Last Year: Never true  Transportation Needs: No Transportation Needs (12/11/2023)   Received from [facility] - Transportation    In the past 12 months, has lack of transportation kept you from medical appointments or from getting medications?: No    Lack of Transportation (Non-Medical): No  Physical Activity: Not on file  Stress: Not on file  Social Connections: Not on file    Allergies: No Known Allergies  Metabolic  Disorder Labs: Lab Results  Component Value Date   HGBA1C 5.6 03/19/2022   MPG 114.02 03/19/2022   No results found for: "PROLACTIN" Lab Results  Component Value Date   CHOL 131 03/19/2022   TRIG 92 03/19/2022   HDL 33 (L) 03/19/2022   CHOLHDL 4.0 03/19/2022   VLDL 18 03/19/2022   LDLCALC 80 03/19/2022   Lab Results  Component Value Date   TSH 0.766 04/01/2022    Therapeutic  Level Labs: No results found for: "LITHIUM" No results found for: "VALPROATE" No results found for: "CBMZ"  Current Medications: Current Outpatient Medications  Medication Sig Dispense Refill   acetaminophen  (TYLENOL ) 325 MG tablet Take 2 tablets (650 mg total) by mouth every 6 (six) hours as needed for mild pain (or Fever >/= 101).     aspirin  EC 81 MG tablet Take 81 mg by mouth daily.     buPROPion  (WELLBUTRIN  XL) 300 MG 24 hr tablet Take 1 tablet (300 mg total) by mouth daily. 90 tablet 1   docusate sodium  (COLACE) 100 MG capsule Take 1 capsule (100 mg total) by mouth 2 (two) times daily. 60 capsule 3   OLANZapine  (ZYPREXA ) 5 MG tablet Take 1 tablet (5 mg total) by mouth at bedtime. 90 tablet 0   sertraline  (ZOLOFT ) 100 MG tablet Take 1 tablet (100 mg total) by mouth at bedtime. Total of 150 mg at night. Take along with 50 mg tab 30 tablet 5   sertraline  (ZOLOFT ) 50 MG tablet Take 1 tablet (50 mg total) by mouth at bedtime. Total of 150 mg at night. Take along with 100 mg tab 30 tablet 5   tamsulosin  (FLOMAX ) 0.4 MG CAPS capsule Take 1 capsule (0.4 mg total) by mouth in the morning and at bedtime. 60 capsule 11   polyethylene glycol (MIRALAX  / GLYCOLAX ) 17 g packet Take 17 g by mouth daily as needed for moderate constipation. 30 each 0   tiZANidine (ZANAFLEX) 2 MG tablet Take 2 mg by mouth every 6 (six) hours.     No current facility-administered medications for this visit.     Musculoskeletal: Strength & Muscle Tone: within normal limits Gait & Station: normal Patient leans: N/A  Psychiatric  Specialty Exam: Review of Systems  Psychiatric/Behavioral:  Positive for dysphoric mood. Negative for agitation, behavioral problems, confusion, decreased concentration, hallucinations, self-injury, sleep disturbance and suicidal ideas. The patient is nervous/anxious. The patient is not hyperactive.   All other systems reviewed and are negative.   Blood pressure 120/82, pulse 67, temperature 98 F (36.7 C), temperature source Temporal, height 6\' 2"  (1.88 m), weight 198 lb (89.8 kg), SpO2 94%.Body mass index is 25.42 kg/m.  General Appearance: Well Groomed  Eye Contact:  Good  Speech:  Clear and Coherent, more spontaneous, less increased latency  Volume:  Normal  Mood:   don't know depression or anxiety  Affect:  Appropriate, Congruent, and calm  Thought Process:  Coherent  Orientation:  Full (Time, Place, and Person)  Thought Content: Logical   Suicidal Thoughts:  No  Homicidal Thoughts:  No  Memory:  Immediate;   Good  Judgement:  Good  Insight:  Good  Psychomotor Activity:  Normal  Concentration:  Concentration: Good and Attention Span: Good  Recall:  Good  Fund of Knowledge: Good  Language: Good  Akathisia:  No  Handed:  Right  AIMS (if indicated): 0 Normal tone, no rigidity, no resting/postural tremors, no tardive dyskinesia    Assets:  Communication Skills Desire for Improvement  ADL's:  Intact  Cognition: WNL  Sleep:  Fair   Screenings: AIMS    Flowsheet Row Admission (Discharged) from 04/11/2022 in [facility] BE[facility]AL MEDICINE  AIMS Total Score 6      AUDIT    Flowsheet Row Admission (Discharged) from 07/04/2022 in [facility]IC[facility] Admission (Discharged) from 04/04/2022 in [facility]AL MEDIC[facility] Admission (Discharged) from 03/13/2022 in [facility] MED[facility]  Alcohol Use Disorder Identification Test Final Score (  AUDIT) 1 0 0      ECT-MADRS    Flowsheet Row ECT Treatment from 09/06/2022 in [facility] Admission (Discharged) from 03/13/2022 in [facility] BEHAVIORAL MEDICINE  MADRS Total Score 13 42      GAD-7    Flowsheet Row Office Visit from 11/06/2023 in [facility] Office Visit from 05/20/2023 in [facility] Office Visit from 03/11/2023 in [facility] Office Visit from 12/02/2022 in [facility] Office Visit from 10/17/2022 in [facility]  Total GAD-7 Score 5 3 9 12 6       Mini-Mental    Flowsheet Row ECT Treatment from 08/16/2022 in [facility] Admission (Discharged) from 03/13/2022 in [facility]  Total Score (max 30 points ) 30 22      PHQ2-9    Flowsheet Row Office Visit from 11/06/2023 in [facility] Office Visit from 09/09/2023 in [facility] Office Visit from 07/15/2023 in [facility] Office Visit from 05/20/2023 in [facility] Office Visit from 03/11/2023 in [facility]  PHQ-2 Total Score 2 2 4 2 2   PHQ-9 Total Score 6 7 8 8 5       Flowsheet Row Office Visit from 07/15/2023 in [facility] Hosp-Admission (Discharged) from 06/25/2023 in [facility] (1A) Office Visit from 12/02/2022 in [facility]  C-SSRS RISK CATEGORY Error: Question 6 not populated No Risk No Risk        Assessment and Plan:  Harumi Gullick is a 83 y.o. year old male with a history of depression, diabetes, hypertension, PAfib, sleep apnea, subdural hematoma, epidural hematoma in 1958,  seizure disorder (no episode for the  past five years, not on anticonvulsant), hip fracture,  right femur after mechanical fall s/p right hip arthroplasty on 6/11, who presents for follow up appointment for below.   1. MDD (major depressive disorder), recurrent, in partial remission (HCC) 2. Anxiety disorder, unspecified type Acute stressors include: concern about the rental property, financial strain, hip fracture, newly found Afib,recent admission due to osteomyelitis  Other stressors include: head injury as a teenager s/p removal of the hematoma   History:good response to ECT 2023 (concern about memory loss), admitted to [facility] in May 2023 for depression, SI, and Sept 2023 for depression (initially confused and non verbal).   The exam is notable for brighter affect, and more spontaneous speech with less increasing latency.  There has been consistent improvement in depressive symptoms, although he continues to experience self-limited anxiety.  He would like to taper down the olanzapine , with possible adverse reaction of xerostomia.  Will proceed tapering down slowly to avoid risk of relapse.  Will initially take the current dose every other day to use all of his medication, although they agree to fill lower dose the next time.  Will continue sertraline  to target depression and anxiety, and bupropion  adjunctive treatment for depression. Noted that he has been maintained on the current dose of bupropion  since his last admission, while he has history of seizure.   3. Cognitive impairment Functional Status   IADL: Independent in the following:            Requires assistance with the following:  managing finances, medications, driving ADL  Independent in the following: bathing and hygiene, feeding, continence, grooming and toileting, walking (walker)          Requires assistance with the following: Folate, Vitamin B12 255 01/2023,  (TSH 11/2022 wnl) Images: Head MRI 07/2022- 1. No acute intracranial abnormality. 2. Progressed chronic small  vessel disease since a 2016 MRI, including fairly numerous chronic micro hemorrhages in both thalami. 3. Prior left frontal craniotomy/cranioplasty with subtle chronic encephalomalacia of underlying frontal gyri. Neuropsych assessment: SLUMS 15/30 06/2022, MOCA 20/30 03/11/2023, mainly  had issues in visuospatial, delayed recall (able to recall with cue 2/5) Etiology: r/o vascular, depression, s/p ECT, TBI (h/o epidural hematoma s/p craniotomy in 1958)  There has been consistent improvement in paucity of thoughts, although he continues to experience memory loss.  Etiology is likely secondary to s/p surgery/admission r/o delirium on top of other etiology as outlined above.  Will continue to assist.    # Insomnia Stable despite discontinuation of trazodone .  Will continue current olanzapine  to target his mood symptoms, and insomnia, off label.        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 (Olanzapine  reduced 10/2023)  Lipid panels LDL 80 02/2022-Due  HbA1c 5.4 10/2023   Advised to obtain lipid panels at his next PCP visit     Plan  Continue sertraline  150 mg at night  Continue bupropion  300 mg daily  Decrease olanzapine  5 mg every other day (tapered down from 5 mg 01/2024, down from 10 mg on11/2024)- when he runs out, his wife will contact the office for refill of 2.5 mg Next appointment:  6/19 at 1 pm, IP - he sees a therapist, Duaine German, in [place] every other way   The patient demonstrates the following risk factors for suicide: Chronic risk factors for suicide include: psychiatric disorder of depression and previous suicide attempts of putting a bag over his head many years ago, but then he took it off. . Acute risk factors for suicide include: unemployment and loss (financial, interpersonal, professional). Protective factors for this patient include: positive social support and hope for the future. Considering these factors, the overall suicide risk at this point  appears to be low. Patient is appropriate for outpatient follow up.     Collaboration of Care: Collaboration of Care: Other reviewed notes in Epic  Patient/Guardian was advised Release of Information must be obtained prior to any record release in order to collaborate their care with an outside provider. Patient/Guardian was advised if they have not already done so to contact the registration department to sign all necessary forms in order for us  to release information regarding their care.   Consent: Patient/Guardian gives verbal consent for treatment and assignment of benefits for services provided during this visit. Patient/Guardian expressed understanding and agreed to proceed.    Todd Fossa, MD 02/24/2024, 1:37 PM

## 2024-02-24 ENCOUNTER — Encounter: Payer: Self-pay | Admitting: Psychiatry

## 2024-02-24 ENCOUNTER — Ambulatory Visit (INDEPENDENT_AMBULATORY_CARE_PROVIDER_SITE_OTHER): Payer: Medicare Other | Admitting: Psychiatry

## 2024-02-24 VITALS — BP 120/82 | HR 67 | Temp 98.0°F | Ht 74.0 in | Wt 198.0 lb

## 2024-02-24 DIAGNOSIS — F3341 Major depressive disorder, recurrent, in partial remission: Secondary | ICD-10-CM

## 2024-02-24 DIAGNOSIS — F419 Anxiety disorder, unspecified: Secondary | ICD-10-CM

## 2024-02-24 DIAGNOSIS — R4189 Other symptoms and signs involving cognitive functions and awareness: Secondary | ICD-10-CM | POA: Diagnosis not present

## 2024-02-24 DIAGNOSIS — G4700 Insomnia, unspecified: Secondary | ICD-10-CM | POA: Diagnosis not present

## 2024-02-24 NOTE — Patient Instructions (Signed)
 Continue sertraline  150 mg at night  Continue bupropion  300 mg daily  Decrease olanzapine  5 mg every other day. With new refill, it will be 2.5 mg at night Next appointment:  6/19 at 1 pm

## 2024-02-26 ENCOUNTER — Encounter: Payer: Self-pay | Admitting: Physician Assistant

## 2024-02-26 ENCOUNTER — Ambulatory Visit (INDEPENDENT_AMBULATORY_CARE_PROVIDER_SITE_OTHER): Payer: Self-pay | Admitting: Physician Assistant

## 2024-02-26 VITALS — BP 131/78 | HR 69 | Ht 74.0 in | Wt 195.0 lb

## 2024-02-26 DIAGNOSIS — R3915 Urgency of urination: Secondary | ICD-10-CM

## 2024-02-26 DIAGNOSIS — Z8546 Personal history of malignant neoplasm of prostate: Secondary | ICD-10-CM | POA: Diagnosis not present

## 2024-02-26 DIAGNOSIS — N401 Enlarged prostate with lower urinary tract symptoms: Secondary | ICD-10-CM | POA: Diagnosis not present

## 2024-02-26 DIAGNOSIS — R3912 Poor urinary stream: Secondary | ICD-10-CM | POA: Diagnosis not present

## 2024-02-26 LAB — BLADDER SCAN AMB NON-IMAGING

## 2024-02-26 NOTE — Progress Notes (Signed)
 02/26/2024 1:23 PM   John [name] 20-Jun-1941 [id number]  CC: Chief Complaint  Patient presents with   Follow-up   HPI: John [name] is a 83 y.o. 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   Today he reports he remains on Flomax  twice daily but ran out of Gemtesa  samples quite a while ago.  His voiding symptoms are stable.  He describes nocturia x 2.  While he has a history of mild OSA, he was previously told that he did not require CPAP.  Interestingly, he describes waking up with wet spots near his collar and on his right pant leg.  He assumes that the collar wetness indicates night sweats, he wonders if his pant legs could be wet due to urinary leakage overnight.  PSA remains stably very low, most recently 0.04 on 05/27/2023.  IPSS 15/unhappy as below, previously 13/unhappy.  PVR 50 mL.   IPSS     Row Name 02/26/24 1300         International Prostate Symptom Score   How often have you had the sensation of not emptying your bladder? Not at All     How often have you had to urinate less than every two hours? Less than 1 in 5 times     How often have you found you stopped and started again several times when you urinated? Almost always     How often have you found it difficult to postpone urination? Almost always     How often have you had a weak urinary stream? Less than half the time     How often have you had to strain to start urination? Not at All     How many times did you typically get up at night to urinate? 2 Times     Total IPSS Score 15       Quality of Life due to urinary symptoms   If you were to spend the rest of your life with your urinary condition just the way it is now how would you feel about that? Unhappy               PMH: Past Medical History:  Diagnosis Date   Cancer (HCC)     Depression    Depression    Diabetes mellitus without complication (HCC)    type 2   Dissection of carotid artery (HCC)    a. 06/2015 CT Head: abnl thickening of mid-dist R cervical ICA w/o narrowing - ? vasculitis and thrombosed/healing dissection; b. 01/2021 Carotid U/S: 1-39% bilat ICA stenoses.   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 Age 22 - struck in head w/ baseball --> s/p craniotomy.   Epidural hematoma (HCC)    H/O Osgood-Schlatter disease    Hand deformity, congenital    Hearing loss bilateral   Hemorrhoids    Horner syndrome    Hypertension    Migraine    Obesity    PONV (postoperative nausea and vomiting)    Prostate cancer (HCC) 2020   Rosacea    SDH (subdural hematoma) (HCC)    Sleep apnea    Spontaneous Subdural hematoma (HCC)    a. Approx 2015 - eval in [place] - conservatively managed.    Surgical History: Past Surgical History:  Procedure Laterality  Date   ANKLE ARTHROSCOPY Left    fracture   CHOLECYSTECTOMY  2007   CRANIOTOMY  1958   left frontal craniotomy for epidural hematoma   HIP ARTHROPLASTY Right 04/07/2022   Procedure: ARTHROPLASTY BIPOLAR HIP (HEMIARTHROPLASTY);  Surgeon: Marlynn Singer, MD;  Location: [facility];  Service: Orthopedics;  Laterality: Right;   INSERTION OF MESH  01/02/2022   Procedure: INSERTION OF MESH;  Surgeon: Eldred Grego, MD;  Location: [facility];  Service: General;;   IRRIGATION AND DEBRIDEMENT FOOT Left 06/27/2023   Procedure: IRRIGATION AND DEBRIDEMENT FOOT;  Surgeon: Anell Baptist, DPM;  Location: [facility];  Service: Orthopedics/Podiatry;  Laterality: Left;   LIGAMENT REPAIR Right    thumb   METATARSAL HEAD EXCISION Left 06/27/2023   Procedure: EXCISION OF BONE IN GREAT TOE JOINT;  Surgeon: Anell Baptist, DPM;  Location: [facility];  Service: Orthopedics/Podiatry;  Laterality: Left;   RETINAL DETACHMENT SURGERY     TREATMENT FISTULA ANAL  2006    Home Medications:  Allergies as of 02/26/2024   No Known  Allergies      Medication List        Accurate as of Feb 26, 2024  1:23 PM. If you have any questions, ask your nurse or doctor.          STOP taking these medications    polyethylene glycol 17 g packet Commonly known as: MIRALAX  / GLYCOLAX  Stopped by: Mehran Guderian   tiZANidine 2 MG tablet Commonly known as: ZANAFLEX Stopped by: Youssouf Shipley       TAKE these medications    acetaminophen  325 MG tablet Commonly known as: TYLENOL  Take 2 tablets (650 mg total) by mouth every 6 (six) hours as needed for mild pain (or Fever >/= 101).   aspirin  EC 81 MG tablet Take 81 mg by mouth daily.   buPROPion  300 MG 24 hr tablet Commonly known as: WELLBUTRIN  XL Take 1 tablet (300 mg total) by mouth daily.   docusate sodium  100 MG capsule Commonly known as: COLACE Take 1 capsule (100 mg total) by mouth 2 (two) times daily.   OLANZapine  5 MG tablet Commonly known as: ZyPREXA  Take 1 tablet (5 mg total) by mouth at bedtime. What changed:  how much to take when to take this   sertraline  100 MG tablet Commonly known as: ZOLOFT  Take 1 tablet (100 mg total) by mouth at bedtime. Total of 150 mg at night. Take along with 50 mg tab   sertraline  50 MG tablet Commonly known as: ZOLOFT  Take 1 tablet (50 mg total) by mouth at bedtime. Total of 150 mg at night. Take along with 100 mg tab   tamsulosin  0.4 MG Caps capsule Commonly known as: FLOMAX  Take 1 capsule (0.4 mg total) by mouth in the morning and at bedtime.        Allergies:  No Known Allergies  Family History: Family History  Problem Relation Age of Onset   Osteoporosis Mother    Depression Mother    Prostate cancer Father    Hypertension Father    Diabetes Brother    Kidney disease Neg Hx     Social History:   reports that he has never smoked. He has never used smokeless tobacco. He reports that he does not currently use alcohol. He reports that he does not use drugs.  Physical Exam: BP  131/78   Pulse 69   Ht 6\' 2"  (1.88 m)   Wt 195 lb (88.5 kg)   BMI 25.04 kg/m  Constitutional:  Alert and oriented, no acute distress, nontoxic appearing HEENT: [place], AT Cardiovascular: No clubbing, cyanosis, or edema Respiratory: Normal respiratory effort, no increased work of breathing Skin: No rashes, bruises or suspicious lesions Neurologic: Grossly intact, no focal deficits, moving all 4 extremities Psychiatric: Normal mood and affect  Laboratory Data: Results for orders placed or performed in visit on 02/26/24  BLADDER SCAN AMB NON-IMAGING   Collection Time: 02/26/24  1:22 PM  Result Value Ref Range   Scan Result 50ml    Assessment & Plan:   1. Benign prostatic hyperplasia with weak urinary stream (Primary) He continues to be primarily bothered by mixed storage and obstructive related symptoms.  He is emptying well on Flomax  twice daily, will continue this.  See #2 below. - BLADDER SCAN AMB NON-IMAGING  2. Urinary urgency We had a lengthy conversation regarding next steps in which we discussed that addressing his obstructive symptoms with an outlet procedure will predictably worsen his OAB wet, and some treatments for his OAB wet will put him at increased risk for incomplete bladder emptying in light of his BPH.  Ultimately we discussed third line therapies that he would like to pursue PTNS, which is reasonable.  He would prefer to defer additional pharmacotherapy, which is reasonable.  I also gave him some condom catheter samples to try overnight to see if diverting his urine will alleviate the wetness on his pants, which would suggest a urinary source.  3. History of prostate cancer PSA remains stably very low, will continue to monitor.  Return for Will call to schedule PTNS.  Kathreen Pare, PA-C  [facility]  [address], [address] [place], [place] [address] [phone]

## 2024-02-26 NOTE — Patient Instructions (Signed)
 Condom Cath Patient Instruction Guide  Applying the male external catheter    Use the sizing guide to ensure a proper fit of the Male external catheter Always measure for correct size (both circumference and length with appropriate measuring guide for specific product) Opening the male external catheter   Flip open the pack using your thumb nail to break the seal.  remove catheter from package  Remove the catheter from the package and place it over the head of the penis. Leave a small space between the end of the penis and the narrow catheter outlet. Uncircumcised users should leave the foreskin in place over the head of the penis. male external catheter application   Pull the double grip strip to slowly unroll the catheter all the way up the length of the penis. The catheter should unroll smoothly and evenly. squeeze catheter to apply   Gently squeeze the catheter around the shaft of the penis (approx. 1 min.) to ensure a secure fit. To reduce the risk of skin irritation, allow the skin on the penis to breathe for short periods in between male external catheter changes.     Connecting the Urine Bag Connecting the [facility] to the Urine bag  Connect a urine collecting bag to the catheter by fully inserting the tubing connector into the external catheter outlet. Push together firmly for a secure connection.    Removal is easy and painless. The catheter can be removed by detaching the catheter from the urine bag connector and carefully rolling it off the penis. If you need to, use warm soapy water to help remove the catheter. It can then be disposed of in the trash, with general household waste. Finish by washing your hands. Change the male external catheter everyday and change the bag according to recommendations from your healthcare professional.

## 2024-03-20 ENCOUNTER — Other Ambulatory Visit: Payer: Self-pay | Admitting: Psychiatry

## 2024-03-24 ENCOUNTER — Other Ambulatory Visit: Payer: Self-pay | Admitting: Psychiatry

## 2024-03-29 ENCOUNTER — Ambulatory Visit (INDEPENDENT_AMBULATORY_CARE_PROVIDER_SITE_OTHER): Admitting: Physician Assistant

## 2024-03-29 DIAGNOSIS — R3915 Urgency of urination: Secondary | ICD-10-CM | POA: Diagnosis not present

## 2024-03-29 NOTE — Progress Notes (Signed)
 PTNS  Session # 1  Health & Social Factors: no change Caffeine: 0 Alcohol: 0 Daytime voids #per day: 3 Night-time voids #per night: 3 Urgency: strong Incontinence Episodes #per day: Unknown, wears Depends Ankle used: right Treatment Setting: 19 Feeling/ Response: sensory Comments: Patient tolerated well. Informed consent signed. PMH left ankle fracture, will plan to treat right side only.  Performed By: Jame Morrell, PA-C   Follow Up: 1 week

## 2024-03-29 NOTE — Patient Instructions (Signed)

## 2024-04-05 ENCOUNTER — Ambulatory Visit (INDEPENDENT_AMBULATORY_CARE_PROVIDER_SITE_OTHER): Admitting: Physician Assistant

## 2024-04-05 DIAGNOSIS — R3915 Urgency of urination: Secondary | ICD-10-CM | POA: Diagnosis not present

## 2024-04-05 NOTE — Progress Notes (Signed)
 PTNS  Session # 2  Health & Social Factors: No change Caffeine: 0 Alcohol: 0 Daytime voids #per day: 5-7 Night-time voids #per night: 1-6 Urgency: strong Incontinence Episodes #per day: <1 Ankle used: right Treatment Setting: 8 Feeling/ Response: sensory Comments: Patient tolerated well  Performed By: Kathreen Pare, PA-C   Follow Up: 1 week

## 2024-04-05 NOTE — Patient Instructions (Signed)

## 2024-04-12 ENCOUNTER — Ambulatory Visit (INDEPENDENT_AMBULATORY_CARE_PROVIDER_SITE_OTHER): Admitting: Physician Assistant

## 2024-04-12 DIAGNOSIS — R3915 Urgency of urination: Secondary | ICD-10-CM | POA: Diagnosis not present

## 2024-04-12 NOTE — Progress Notes (Signed)
 PTNS  Session # 3  Health & Social Factors: No change Caffeine: 0 Alcohol: 0 Daytime voids #per day: 4-8 Night-time voids #per night: 1-4 Urgency: mild Incontinence Episodes #per day: 1 Ankle used: right Treatment Setting: 3 Feeling/ Response: sensory Comments: Patient tolerated well  Performed By: Kathreen Pare, PA-C   Follow Up: 1 week

## 2024-04-12 NOTE — Patient Instructions (Signed)

## 2024-04-15 ENCOUNTER — Telehealth: Payer: Self-pay

## 2024-04-15 ENCOUNTER — Other Ambulatory Visit: Payer: Self-pay | Admitting: Psychiatry

## 2024-04-15 MED ORDER — OLANZAPINE 2.5 MG PO TABS: 2.5000 mg | ORAL_TABLET | Freq: Every day | ORAL | 0 refills | Status: DC

## 2024-04-15 NOTE — Telephone Encounter (Signed)
 pt wife states that John [name] is taking 5mg  every other day and that is not working she wanted to know if he can take 2.5mg  every day.  pt last seen on 4-29 next appt 7-10

## 2024-04-15 NOTE — Telephone Encounter (Signed)
 spoke with pt wife, she states that with taking the 5mg  every other day it like it not long enough it wears off to soon. that why she was requesting a 2.5mg  be sent to take everyday.  she was told that a rx was sent for the 2.5mg  take 1 at bedtime

## 2024-04-15 NOTE — Telephone Encounter (Signed)
 That was the original plan--to start with olanzapine  2.5 mg at night. The prescription has been sent to the pharmacy. Could you clarify what she meant by not working? If they continue to have concerns even after taking 2.5 mg nightly, please advise her to contact our office.

## 2024-04-19 ENCOUNTER — Ambulatory Visit (INDEPENDENT_AMBULATORY_CARE_PROVIDER_SITE_OTHER): Admitting: Physician Assistant

## 2024-04-19 DIAGNOSIS — R3915 Urgency of urination: Secondary | ICD-10-CM

## 2024-04-19 NOTE — Patient Instructions (Signed)

## 2024-04-19 NOTE — Progress Notes (Signed)
 PTNS  Session # 4  Health & Social Factors: no change Caffeine: 0 Alcohol: 0 Daytime voids #per day: 4 Night-time voids #per night: 2-3 Urgency: mild Incontinence Episodes #per day: 2-3 Ankle used: right Treatment Setting: 19 Feeling/ Response: toe flex Comments: Patient tolerated well  Performed By: Lucie Hones, PA-C   Follow Up: 1 week

## 2024-04-26 ENCOUNTER — Ambulatory Visit (INDEPENDENT_AMBULATORY_CARE_PROVIDER_SITE_OTHER): Admitting: Physician Assistant

## 2024-04-26 DIAGNOSIS — R3915 Urgency of urination: Secondary | ICD-10-CM

## 2024-04-26 NOTE — Patient Instructions (Signed)

## 2024-04-26 NOTE — Progress Notes (Signed)
 PTNS  Session # 5  Health & Social Factors: no change Caffeine: 0 Alcohol: 0 Daytime voids #per day: 4-5 Night-time voids #per night: 2-5 Urgency: mild Incontinence Episodes #per day: 0 Ankle used: right Treatment Setting: 19 Feeling/ Response: toe flex Comments: Patient tolerated well. Noticing improved urgency.  Performed By: Tillman Kazmierski, PA-C   Follow Up: 1 week

## 2024-04-27 ENCOUNTER — Telehealth: Payer: Self-pay | Admitting: Physician Assistant

## 2024-04-27 NOTE — Telephone Encounter (Signed)
 LMOM  can you please call him and offer him a PTNS with Clotilda on Tuesday 7/15 at 1pm

## 2024-04-28 ENCOUNTER — Telehealth: Payer: Self-pay | Admitting: Physician Assistant

## 2024-04-28 NOTE — Telephone Encounter (Signed)
 Patient called and lvm stating that he received notice from his Medicare that his PTNS treatment was not covered. His guarantor account shows that they have paid for previous PTNS visits. He is requesting a call to discuss this.

## 2024-05-02 NOTE — Progress Notes (Unsigned)
 BH MD/PA/NP OP Progress Note  05/02/2024 2:47 PM Mattew [name]  MRN:  [id number]  Chief Complaint: No chief complaint on file.  HPI: *** Visit Diagnosis: No diagnosis found.  Past Psychiatric History: Please see initial evaluation for full details. I have reviewed the history. No updates at this time.     Past Medical History:  Past Medical History:  Diagnosis Date   Cancer (HCC)    Depression    Depression    Diabetes mellitus without complication (HCC)    type 2   Dissection of carotid artery (HCC)    a. 06/2015 CT Head: abnl thickening of mid-dist R cervical ICA w/o narrowing - ? vasculitis and thrombosed/healing dissection; b. 01/2021 Carotid U/S: 1-39% bilat ICA stenoses.   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 Age 83 - struck in head w/ baseball --> s/p craniotomy.   Epidural hematoma (HCC)    H/O Osgood-Schlatter disease    Hand deformity, congenital    Hearing loss bilateral   Hemorrhoids    Horner syndrome    Hypertension    Migraine    Obesity    PONV (postoperative nausea and vomiting)    Prostate cancer (HCC) 2020   Rosacea    SDH (subdural hematoma) (HCC)    Sleep apnea    Spontaneous Subdural hematoma (HCC)    a. Approx 2015 - eval in [place] - conservatively managed.    Past Surgical History:  Procedure Laterality Date   ANKLE ARTHROSCOPY Left    fracture   CHOLECYSTECTOMY  2007   CRANIOTOMY  1958   left frontal craniotomy for epidural hematoma   HIP ARTHROPLASTY Right 04/07/2022   Procedure: ARTHROPLASTY BIPOLAR HIP (HEMIARTHROPLASTY);  Surgeon: Cleotilde Barrio, MD;  Location: [facility];  Service: Orthopedics;  Laterality: Right;   INSERTION OF MESH  01/02/2022   Procedure: INSERTION OF MESH;  Surgeon: Rodolph Romano, MD;  Location: [facility];  Service: General;;   IRRIGATION AND DEBRIDEMENT FOOT Left 06/27/2023   Procedure: IRRIGATION AND DEBRIDEMENT FOOT;  Surgeon: Ashley Soulier, DPM;  Location: [facility];  Service:  Orthopedics/Podiatry;  Laterality: Left;   LIGAMENT REPAIR Right    thumb   METATARSAL HEAD EXCISION Left 06/27/2023   Procedure: EXCISION OF BONE IN GREAT TOE JOINT;  Surgeon: Ashley Soulier, DPM;  Location: [facility];  Service: Orthopedics/Podiatry;  Laterality: Left;   RETINAL DETACHMENT SURGERY     TREATMENT FISTULA ANAL  2006    Family Psychiatric History: Please see initial evaluation for full details. I have reviewed the history. No updates at this time.     Family History:  Family History  Problem Relation Age of Onset   Osteoporosis Mother    Depression Mother    Prostate cancer Father    Hypertension Father    Diabetes Brother    Kidney disease Neg Hx     Social History:  Social History   Socioeconomic History   Marital status: Married    Spouse name: John [name]   Number of children: 2   Years of education: Not on file   Highest education level: Master's degree (e.g., MA, MS, MEng, MEd, MSW, MBA)  Occupational History   Not on file  Tobacco Use   Smoking status: Never   Smokeless tobacco: Never  Vaping Use   Vaping status: Never Used  Substance and Sexual Activity   Alcohol use: Not Currently   Drug use: No   Sexual activity: Yes  Birth control/protection: None  Other Topics Concern   Not on file  Social History Narrative   Live at home with wife.    Social Drivers of Corporate investment banker Strain: Low Risk  (12/11/2023)   Received from [facility]   Overall Financial Resource Strain (CARDIA)    Difficulty of Paying Living Expenses: Not very hard  Food Insecurity: No Food Insecurity (12/11/2023)   Received from [facility]   Hunger Vital Sign    Within the past 12 months, you worried that your food would run out before you got the money to buy more.: Never true    Within the past 12 months, the food you bought just didn't last and you didn't have money to get more.: Never true  Transportation Needs: No  Transportation Needs (12/11/2023)   Received from [facility] - Transportation    In the past 12 months, has lack of transportation kept you from medical appointments or from getting medications?: No    Lack of Transportation (Non-Medical): No  Physical Activity: Not on file  Stress: Not on file  Social Connections: Not on file    Allergies: No Known Allergies  Metabolic Disorder Labs: Lab Results  Component Value Date   HGBA1C 5.6 03/19/2022   MPG 114.02 03/19/2022   No results found for: PROLACTIN Lab Results  Component Value Date   CHOL 131 03/19/2022   TRIG 92 03/19/2022   HDL 33 (L) 03/19/2022   CHOLHDL 4.0 03/19/2022   VLDL 18 03/19/2022   LDLCALC 80 03/19/2022   Lab Results  Component Value Date   TSH 0.766 04/01/2022    Therapeutic Level Labs: No results found for: LITHIUM No results found for: VALPROATE No results found for: CBMZ  Current Medications: Current Outpatient Medications  Medication Sig Dispense Refill   acetaminophen  (TYLENOL ) 325 MG tablet Take 2 tablets (650 mg total) by mouth every 6 (six) hours as needed for mild pain (or Fever >/= 101).     aspirin  EC 81 MG tablet Take 81 mg by mouth daily.     buPROPion  (WELLBUTRIN  XL) 300 MG 24 hr tablet Take 1 tablet (300 mg total) by mouth daily. 90 tablet 1   docusate sodium  (COLACE) 100 MG capsule Take 1 capsule (100 mg total) by mouth 2 (two) times daily. 60 capsule 3   OLANZapine  (ZYPREXA ) 2.5 MG tablet Take 1 tablet (2.5 mg total) by mouth at bedtime. 90 tablet 0   sertraline  (ZOLOFT ) 100 MG tablet Take 1 tablet (100 mg total) by mouth at bedtime. Total of 150 mg at night. Take along with 50 mg tab 30 tablet 5   sertraline  (ZOLOFT ) 50 MG tablet Take 1 tablet (50 mg total) by mouth at bedtime. Total of 150 mg at night. Take along with 100 mg tab 90 tablet 1   tamsulosin  (FLOMAX ) 0.4 MG CAPS capsule Take 1 capsule (0.4 mg total) by mouth in the morning and at bedtime.  60 capsule 11   No current facility-administered medications for this visit.     Musculoskeletal: Strength & Muscle Tone: normal Gait & Station: normal Patient leans: N/A  Psychiatric Specialty Exam: Review of Systems  There were no vitals taken for this visit.There is no height or weight on file to calculate BMI.  General Appearance: {Appearance:22683}  Eye Contact:  {BHH EYE CONTACT:22684}  Speech:  Clear and Coherent  Volume:  Normal  Mood:  {BHH MOOD:22306}  Affect:  {  Affect (PAA):22687}  Thought Process:  Coherent  Orientation:  Full (Time, Place, and Person)  Thought Content: Logical   Suicidal Thoughts:  {ST/HT (PAA):22692}  Homicidal Thoughts:  {ST/HT (PAA):22692}  Memory:  Immediate;   Good  Judgement:  {Judgement (PAA):22694}  Insight:  {Insight (PAA):22695}  Psychomotor Activity:  Normal  Concentration:  Concentration: Good and Attention Span: Good  Recall:  Good  Fund of Knowledge: Good  Language: Good  Akathisia:  No  Handed:  Right  AIMS (if indicated): not done  Assets:  Communication Skills Desire for Improvement  ADL's:  Intact  Cognition: WNL  Sleep:  {BHH GOOD/FAIR/POOR:22877}   Screenings: AIMS    Flowsheet Row Admission (Discharged) from 04/11/2022 in [facility]  AIMS Total Score 6   AUDIT    Flowsheet Row Admission (Discharged) from 07/04/2022 in [facility] BE[facility] Admission (Discharged) from 04/04/2022 in [facility]ICINE Admission (Discharged) from 03/13/2022 in [facility]  Alcohol Use Disorder Identification Test Final Score (AUDIT) 1 0 0   ECT-MADRS    Flowsheet Row ECT Treatment from 09/06/2022 in [facility] Admission (Discharged) from 03/13/2022 in [facility] BE[facility] MEDICINE  MADRS Total Score 13 42   GAD-7    Flowsheet Row Office Visit from 11/06/2023 in [facility] Office  Visit from 05/20/2023 in [facility] Office Visit from 03/11/2023 in [facility] Office Visit from 12/02/2022 in [facility] Office Visit from 10/17/2022 in [facility]  Total GAD-7 Score 5 3 9 12 6    Mini-Mental    Flowsheet Row ECT Treatment from 08/16/2022 in [facility] Admission (Discharged) from 03/13/2022 in [facility] BE[facility]IORAL MEDICINE  Total Score (max 30 points ) 30 22   PHQ2-9    Flowsheet Row Office Visit from 11/06/2023 in [facility] Office Visit from 09/09/2023 in [facility] Office Visit from 07/15/2023 in [facility] Office Visit from 05/20/2023 in [facility] Office Visit from 03/11/2023 in [facility]  PHQ-2 Total Score 2 2 4 2 2   PHQ-9 Total Score 6 7 8 8 5    Flowsheet Row Office Visit from 07/15/2023 in [facility] ED to Hosp-Admission (Discharged) from 06/25/2023 in [facility] (1A) Office Visit from 12/02/2022 in [facility]  C-SSRS RISK CATEGORY Error: Question 6 not populated No Risk No Risk     Assessment and Plan:  John [name] is a 83 y.o. year old male with a history of depression, diabetes, hypertension, PAfib, sleep apnea, subdural hematoma, epidural hematoma in 1958,  seizure disorder (no episode for the past five years, not on anticonvulsant), hip fracture,  right femur after mechanical fall s/p right hip arthroplasty on 6/11, who presents for follow up appointment for below.    1. MDD (major depressive disorder), recurrent, in partial remission (HCC) 2. Anxiety  disorder, unspecified type Acute stressors include: concern about the rental property, financial strain, hip fracture, newly found Afib,recent admission due to osteomyelitis  Other stressors include: head injury as a teenager s/p removal of the hematoma   History:good response to ECT 2023 (concern about memory loss), admitted to [facility] in May 2023 for depression, SI, and Sept 2023 for  depression (initially confused and non verbal).   The exam is notable for brighter affect, and more spontaneous speech with less increasing latency.  There has been consistent improvement in depressive symptoms, although he continues to experience self-limited anxiety.  He would like to taper down the olanzapine , with possible adverse reaction of xerostomia.  Will proceed tapering down slowly to avoid risk of relapse.  Will initially take the current dose every other day to use all of his medication, although they agree to fill lower dose the next time.  Will continue sertraline  to target depression and anxiety, and bupropion  adjunctive treatment for depression. Noted that he has been maintained on the current dose of bupropion  since his last admission, while he has history of seizure.    3. Cognitive impairment Functional Status   IADL: Independent in the following:            Requires assistance with the following: managing finances, medications, driving ADL  Independent in the following: bathing and hygiene, feeding, continence, grooming and toileting, walking (walker)          Requires assistance with the following: Folate, Vitamin B12 255 01/2023,  (TSH 11/2022 wnl) Images: Head MRI 07/2022- 1. No acute intracranial abnormality. 2. Progressed chronic small vessel disease since a 2016 MRI, including fairly numerous chronic micro hemorrhages in both thalami. 3. Prior left frontal craniotomy/cranioplasty with subtle chronic encephalomalacia of underlying frontal gyri. Neuropsych assessment: SLUMS 15/30 06/2022, MOCA 20/30  03/11/2023, mainly  had issues in visuospatial, delayed recall (able to recall with cue 2/5) Etiology: r/o vascular, depression, s/p ECT, TBI (h/o epidural hematoma s/p craniotomy in 1958)  There has been consistent improvement in paucity of thoughts, although he continues to experience memory loss.  Etiology is likely secondary to s/p surgery/admission r/o delirium on top of other etiology as outlined above.  Will continue to assist.    # Insomnia Stable despite discontinuation of trazodone .  Will continue current olanzapine  to target his mood symptoms, and insomnia, off label.        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 (Olanzapine  reduced 10/2023)  Lipid panels LDL 80 02/2022-Due  HbA1c 5.4 10/2023   Advised to obtain lipid panels at his next PCP visit     Plan  Continue sertraline  150 mg at night  Continue bupropion  300 mg daily  Decrease olanzapine  5 mg every other day (tapered down from 5 mg 01/2024, down from 10 mg on11/2024)- when he runs out, his wife will contact the office for refill of 2.5 mg Next appointment:  6/19 at 1 pm, IP - he sees a therapist, Rico Kato, in [place] every other way   The patient demonstrates the following risk factors for suicide: Chronic risk factors for suicide include: psychiatric disorder of depression and previous suicide attempts of putting a bag over his head many years ago, but then he took it off. . Acute risk factors for suicide include: unemployment and loss (financial, interpersonal, professional). Protective factors for this patient include: positive social support and hope for the future. Considering these factors, the overall suicide risk at this point appears to be low. Patient is appropriate for outpatient follow up.     Collaboration of Care: Collaboration of Care: {BH OP Collaboration of Care:21014065}  Patient/Guardian was advised Release of Information must be obtained prior to any record release in order to  collaborate their care with an outside provider. Patient/Guardian was advised if they have not already done  so to contact the registration department to sign all necessary forms in order for us  to release information regarding their care.   Consent: Patient/Guardian gives verbal consent for treatment and assignment of benefits for services provided during this visit. Patient/Guardian expressed understanding and agreed to proceed.    Katheren Sleet, MD 05/02/2024, 2:47 PM

## 2024-05-03 ENCOUNTER — Ambulatory Visit: Admitting: Physician Assistant

## 2024-05-03 NOTE — Telephone Encounter (Signed)
 Pt called back and can not do Tuesday 7/15 at 1 due to he has another appt scheduled at 12:10 pm. When do you want him to come in again?

## 2024-05-05 ENCOUNTER — Ambulatory Visit (INDEPENDENT_AMBULATORY_CARE_PROVIDER_SITE_OTHER): Admitting: Physician Assistant

## 2024-05-05 DIAGNOSIS — R3915 Urgency of urination: Secondary | ICD-10-CM

## 2024-05-05 NOTE — Progress Notes (Signed)
 PTNS  Session # 6  Health & Social Factors: no change Caffeine: 0 Alcohol: 0 Daytime voids #per day: 3-4 Night-time voids #per night: 3-4 Urgency: mild Incontinence Episodes #per day: 0 Ankle used: right Treatment Setting: 3 Feeling/ Response: sensory Comments: Patient tolerated well.  Performed By: Marquel Pottenger, PA-C   Follow Up: 1 week

## 2024-05-05 NOTE — Patient Instructions (Signed)

## 2024-05-06 ENCOUNTER — Ambulatory Visit (INDEPENDENT_AMBULATORY_CARE_PROVIDER_SITE_OTHER): Admitting: Psychiatry

## 2024-05-06 ENCOUNTER — Encounter: Payer: Self-pay | Admitting: Psychiatry

## 2024-05-06 VITALS — BP 122/76 | HR 68 | Temp 98.2°F | Ht 74.0 in | Wt 192.0 lb

## 2024-05-06 DIAGNOSIS — F3341 Major depressive disorder, recurrent, in partial remission: Secondary | ICD-10-CM | POA: Diagnosis not present

## 2024-05-06 DIAGNOSIS — F419 Anxiety disorder, unspecified: Secondary | ICD-10-CM | POA: Diagnosis not present

## 2024-05-06 DIAGNOSIS — R4189 Other symptoms and signs involving cognitive functions and awareness: Secondary | ICD-10-CM

## 2024-05-06 NOTE — Patient Instructions (Signed)
 Continue sertraline  150 mg at night  Continue bupropion  300 mg daily  Continue olanzapine  2.5 mg at night  Next appointment:  9/4 at 3 pm,

## 2024-05-10 ENCOUNTER — Ambulatory Visit: Admitting: Physician Assistant

## 2024-05-10 NOTE — Progress Notes (Unsigned)
 PTNS  Session # 7/12  Health & Social Factors: No changes  Caffeine: 0 Alcohol: 0  Daytime voids #per day: 6 Night-time voids #per night: 3 Urgency: Mild Incontinence Episodes #per day: 0 Ankle used: Right Treatment Setting: 4 Feeling/ Response: Toe Flex  Comments: Patient tolerated   Performed By: CLOTILDA CORNWALL, PA-C and Trish LITTIE Pinal, RN    Follow Up: One week for # 8/12 PTNS

## 2024-05-11 ENCOUNTER — Ambulatory Visit (INDEPENDENT_AMBULATORY_CARE_PROVIDER_SITE_OTHER): Admitting: Urology

## 2024-05-11 DIAGNOSIS — R3915 Urgency of urination: Secondary | ICD-10-CM

## 2024-05-11 NOTE — Patient Instructions (Signed)

## 2024-05-12 ENCOUNTER — Ambulatory Visit: Admitting: Urology

## 2024-05-12 NOTE — Telephone Encounter (Signed)
 LMTRC

## 2024-05-14 NOTE — Telephone Encounter (Signed)
 Pt aware medicare is paying on PTNS visits. The remainder of 17.61 (co-insurance) is being filed to [employer] (supplemental) however they are not paying.   Advised pt to contact [employer] for EOB.   Pt voiced understanding.

## 2024-05-17 ENCOUNTER — Ambulatory Visit (INDEPENDENT_AMBULATORY_CARE_PROVIDER_SITE_OTHER): Admitting: Physician Assistant

## 2024-05-17 DIAGNOSIS — R3915 Urgency of urination: Secondary | ICD-10-CM

## 2024-05-17 NOTE — Patient Instructions (Signed)

## 2024-05-17 NOTE — Progress Notes (Unsigned)
 PTNS  Session # 8  Health & Social Factors: no change Caffeine: 0 Alcohol: 0 Daytime voids #per day: 2-8 Night-time voids #per night: 2-3 Urgency: mild Incontinence Episodes #per day: 0 Ankle used: right Treatment Setting: 3 Feeling/ Response: sensory Comments: Patient tolerated well.  Performed By: Jarvis Knodel, PA-C and Mathew Pinal, RN  Follow Up: 1 week

## 2024-05-19 ENCOUNTER — Other Ambulatory Visit: Payer: Self-pay | Admitting: Psychiatry

## 2024-05-24 ENCOUNTER — Ambulatory Visit: Admitting: Physician Assistant

## 2024-05-25 ENCOUNTER — Ambulatory Visit (INDEPENDENT_AMBULATORY_CARE_PROVIDER_SITE_OTHER): Admitting: Physician Assistant

## 2024-05-25 DIAGNOSIS — R3915 Urgency of urination: Secondary | ICD-10-CM

## 2024-05-25 NOTE — Patient Instructions (Signed)

## 2024-05-25 NOTE — Progress Notes (Signed)
 PTNS  Session # 9  Health & Social Factors: Change Caffeine: 0 Alcohol: 0 Daytime voids #per day: 4 Night-time voids #per night: 2-3 Urgency: Mild Incontinence Episodes #per day: Once in a while Ankle used: Right Treatment Setting: 3 Feeling/ Response: Both Comments: N/A  Performed By: Gordan DEL, CMA and Macario HERO, RN  Follow Up: 1 week

## 2024-05-31 ENCOUNTER — Ambulatory Visit: Admitting: Physician Assistant

## 2024-05-31 DIAGNOSIS — R3915 Urgency of urination: Secondary | ICD-10-CM

## 2024-05-31 NOTE — Progress Notes (Signed)
 PTNS  Session # 10  Health & Social Factors: a little change Caffeine: 0  Alcohol: 0 Daytime voids #per day: 4 Night-time voids #per night: 2-3 Urgency: mild Incontinence Episodes #per day: 1-2 Ankle used: right Treatment Setting: 9 Feeling/ Response: sensory Comments: n/a  Performed By: Mathew Pinal, Rn  Follow Up: 1 week

## 2024-05-31 NOTE — Patient Instructions (Signed)

## 2024-06-07 ENCOUNTER — Ambulatory Visit (INDEPENDENT_AMBULATORY_CARE_PROVIDER_SITE_OTHER): Admitting: Physician Assistant

## 2024-06-07 DIAGNOSIS — R3915 Urgency of urination: Secondary | ICD-10-CM | POA: Diagnosis not present

## 2024-06-07 NOTE — Patient Instructions (Signed)

## 2024-06-07 NOTE — Progress Notes (Signed)
 PTNS  Session # 11  Health & Social Factors: no change Caffeine: 0 Alcohol: 0 Daytime voids #per day: 4-5 Night-time voids #per night: 2-3 Urgency: mild Incontinence Episodes #per day: 0 Ankle used: right Treatment Setting: 19 Feeling/ Response: both Comments: Patient tolerated well.  Performed By: Maxwell Lemen, PA-C and Mathew Pinal, RN  Follow Up: 1 week

## 2024-06-14 ENCOUNTER — Ambulatory Visit: Admitting: Physician Assistant

## 2024-06-14 VITALS — BP 114/73 | HR 71

## 2024-06-14 DIAGNOSIS — R3915 Urgency of urination: Secondary | ICD-10-CM

## 2024-06-14 NOTE — Progress Notes (Signed)
 PTNS  Session # 12  Health & Social Factors: no change Caffeine: 0 Alcohol: 0 Daytime voids #per day: 3-4 Night-time voids #per night: 2-3 Urgency: mild Incontinence Episodes #per day: 0 Ankle used: right Treatment Setting: 5 Feeling/ Response: both Comments: Patient tolerated well.  Performed By: Symeon Puleo, PA-C   Follow Up: Return in about 4 weeks (around 07/12/2024) for Post-PTNS symptom recheck.

## 2024-06-14 NOTE — Patient Instructions (Signed)

## 2024-06-21 ENCOUNTER — Ambulatory Visit: Admitting: Physician Assistant

## 2024-06-26 NOTE — Progress Notes (Unsigned)
 BH MD/PA/NP OP Progress Note  07/02/2024 10:28 AM John [name]  MRN:  [id number]  Chief Complaint:  Chief Complaint  Patient presents with   Follow-up   HPI:  This is a follow-up appointment for depression, anxiety and cognitive impairment.  He states that he feels better later in the day.  He tends to feel more anxious than depressed in the morning.  He is concerned about his age.  He is hoping to go for a walk, he feels a little weak, which he attributes to not doing exercise as much.  He is also anxious about renting a property. His son will move in in early November. .  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  He likes to learn new things.  He denies much difference since tapering down olanzapine .  He is willing to try taking it less frequently.  He sleeps well except he has nocturia.  He denies SI, HI, hallucinations.  He struggles with memory. Evaluated with MOCA as below.  He asks if he can drive by himself.  After psychoeducation was provided, he agrees to drive only when accompanied by his wife.   Wt Readings from Last 3 Encounters:  07/01/24 201 lb (91.2 kg)  05/06/24 192 lb (87.1 kg)  02/26/24 195 lb (88.5 kg)     Visit Diagnosis:    ICD-10-CM   1. MDD (major depressive disorder), recurrent, in partial remission (HCC)  F33.41     2. Anxiety disorder, unspecified type  F41.9     3. Cognitive impairment  R41.89       Past Psychiatric History: Please see initial evaluation for full details. I have reviewed the history. No updates at this time.     Past Medical History:  Past Medical History:  Diagnosis Date   Cancer (HCC)    Depression    Depression    Diabetes mellitus without complication (HCC)    type 2   Dissection of carotid artery (HCC)    a. 06/2015 CT Head: abnl thickening of mid-dist R cervical ICA w/o narrowing - ? vasculitis and thrombosed/healing dissection; b. 01/2021 Carotid U/S: 1-39% bilat ICA stenoses.   Eczema    Elevated  PSA    Epidural hematoma (HCC)    a. Age 32 - struck in head w/ baseball --> s/p craniotomy.   Epidural hematoma (HCC)    H/O Osgood-Schlatter disease    Hand deformity, congenital    Hearing loss bilateral   Hemorrhoids    Horner syndrome    Hypertension    Migraine    Obesity    PONV (postoperative nausea and vomiting)    Prostate cancer (HCC) 2020   Rosacea    SDH (subdural hematoma) (HCC)    Sleep apnea    Spontaneous Subdural hematoma (HCC)    a. Approx 2015 - eval in [place] - conservatively managed.    Past Surgical History:  Procedure Laterality Date   ANKLE ARTHROSCOPY Left    fracture   CHOLECYSTECTOMY  2007   CRANIOTOMY  1958   left frontal craniotomy for epidural hematoma   HIP ARTHROPLASTY Right 04/07/2022   Procedure: ARTHROPLASTY BIPOLAR HIP (HEMIARTHROPLASTY);  Surgeon: Cleotilde Barrio, MD;  Location: [facility];  Service: Orthopedics;  Laterality: Right;   INSERTION OF MESH  01/02/2022   Procedure: INSERTION OF MESH;  Surgeon: Rodolph Romano, MD;  Location: [facility];  Service: General;;   IRRIGATION AND DEBRIDEMENT FOOT Left 06/27/2023   Procedure: IRRIGATION AND DEBRIDEMENT FOOT;  Surgeon: Ashley Soulier, DPM;  Location: [facility];  Service: Orthopedics/Podiatry;  Laterality: Left;   LIGAMENT REPAIR Right    thumb   METATARSAL HEAD EXCISION Left 06/27/2023   Procedure: EXCISION OF BONE IN GREAT TOE JOINT;  Surgeon: Ashley Soulier, DPM;  Location: [facility];  Service: Orthopedics/Podiatry;  Laterality: Left;   RETINAL DETACHMENT SURGERY     TREATMENT FISTULA ANAL  2006    Family Psychiatric History: Please see initial evaluation for full details. I have reviewed the history. No updates at this time.     Family History:  Family History  Problem Relation Age of Onset   Osteoporosis Mother    Depression Mother    Prostate cancer Father    Hypertension Father    Diabetes Brother    Kidney disease Neg Hx     Social History:  Social History   Socioeconomic  History   Marital status: Married    Spouse name: Niels   Number of children: 2   Years of education: Not on file   Highest education level: Master's degree (e.g., MA, MS, MEng, MEd, MSW, MBA)  Occupational History   Not on file  Tobacco Use   Smoking status: Never   Smokeless tobacco: Never  Vaping Use   Vaping status: Never Used  Substance and Sexual Activity   Alcohol use: Not Currently   Drug use: No   Sexual activity: Yes    Birth control/protection: None  Other Topics Concern   Not on file  Social History Narrative   Live at home with wife.    Social Drivers of Corporate investment banker Strain: Low Risk  (12/11/2023)   Received from [facility]   Overall Financial Resource Strain (CARDIA)    Difficulty of Paying Living Expenses: Not very hard  Food Insecurity: No Food Insecurity (12/11/2023)   Received from [facility]   Hunger Vital Sign    Within the past 12 months, you worried that your food would run out before you got the money to buy more.: Never true    Within the past 12 months, the food you bought just didn't last and you didn't have money to get more.: Never true  Transportation Needs: No Transportation Needs (12/11/2023)   Received from [facility] - Transportation    In the past 12 months, has lack of transportation kept you from medical appointments or from getting medications?: No    Lack of Transportation (Non-Medical): No  Physical Activity: Not on file  Stress: Not on file  Social Connections: Not on file    Allergies: No Known Allergies  Metabolic Disorder Labs: Lab Results  Component Value Date   HGBA1C 5.6 03/19/2022   MPG 114.02 03/19/2022   No results found for: PROLACTIN Lab Results  Component Value Date   CHOL 131 03/19/2022   TRIG 92 03/19/2022   HDL 33 (L) 03/19/2022   CHOLHDL 4.0 03/19/2022   VLDL 18 03/19/2022   LDLCALC 80 03/19/2022   Lab Results   Component Value Date   TSH 0.766 04/01/2022    Therapeutic Level Labs: No results found for: LITHIUM No results found for: VALPROATE No results found for: CBMZ  Current Medications: Current Outpatient Medications  Medication Sig Dispense Refill   acetaminophen  (TYLENOL ) 325 MG tablet Take 2 tablets (650 mg total) by mouth every 6 (six) hours as needed for mild pain (or Fever >/= 101).     aspirin  EC  81 MG tablet Take 81 mg by mouth daily.     docusate sodium  (COLACE) 100 MG capsule Take 1 capsule (100 mg total) by mouth 2 (two) times daily. 60 capsule 3   sertraline  (ZOLOFT ) 100 MG tablet Take 1 tablet (100 mg total) by mouth at bedtime. Total of 150 mg at night. Take along with 50 mg tab 30 tablet 5   sertraline  (ZOLOFT ) 50 MG tablet Take 1 tablet (50 mg total) by mouth at bedtime. Total of 150 mg at night. Take along with 100 mg tab 90 tablet 1   tamsulosin  (FLOMAX ) 0.4 MG CAPS capsule Take 1 capsule (0.4 mg total) by mouth in the morning and at bedtime. 60 capsule 11   [START ON 07/28/2024] buPROPion  (WELLBUTRIN  XL) 300 MG 24 hr tablet Take 1 tablet (300 mg total) by mouth daily. 90 tablet 1   [START ON 07/14/2024] OLANZapine  (ZYPREXA ) 2.5 MG tablet Take 1 tablet (2.5 mg total) by mouth every other day. 45 tablet 0   No current facility-administered medications for this visit.     Musculoskeletal: Strength & Muscle Tone: within normal limits Gait & Station: normal Patient leans: N/A  Psychiatric Specialty Exam: Review of Systems  Psychiatric/Behavioral:  Negative for agitation, behavioral problems, confusion, decreased concentration, dysphoric mood, hallucinations, self-injury, sleep disturbance and suicidal ideas. The patient is nervous/anxious. The patient is not hyperactive.   All other systems reviewed and are negative.   Blood pressure 124/78, pulse 67, temperature 98.7 F (37.1 C), temperature source Temporal, height 6' 2 (1.88 m), weight 201 lb (91.2 kg), SpO2  100%.Body mass index is 25.81 kg/m.  General Appearance: Well Groomed  Eye Contact:  Good  Speech:  Clear and Coherent  Volume:  Normal  Mood:  good  Affect:  Appropriate, Congruent, and calm  Thought Process:  Coherent  Orientation:  Full (Time, Place, and Person)  Thought Content: Logical   Suicidal Thoughts:  No  Homicidal Thoughts:  No  Memory:  Immediate;   Good  Judgement:  Good  Insight:  Good  Psychomotor Activity:  Normal, no resting tremors  Concentration:  Concentration: Good and Attention Span: Good  Recall:  Good  Fund of Knowledge: Good  Language: Good  Akathisia:  No  Handed:  Right  AIMS (if indicated): 0   Assets:  Communication Skills Desire for Improvement  ADL's:  Intact  Cognition: WNL  Sleep:  Fair   Screenings: AIMS    Flowsheet Row Admission (Discharged) from 04/11/2022 in [facility]AL MEDICINE  AIMS Total Score 6   AUDIT    Flowsheet Row Admission (Discharged) from 07/04/2022 in [facility] BE[facility] MEDICINE Admission (Discharged) from 04/04/2022 in [facility] BE[facility]IORAL MEDIC[facility] Admission (Discharged) from 03/13/2022 in [facility] BE[facility]AVIORAL MEDICINE  Alcohol Use Disorder Identification Test Final Score (AUDIT) 1 0 0   ECT-MADRS    Flowsheet Row ECT Treatment from 09/06/2022 in [facility] Admission (Discharged) from 03/13/2022 in [facility] BE[facility]AVIORAL MEDICINE  MADRS Total Score 13 42   GAD-7    Flowsheet Row Office Visit from 11/06/2023 in [facility] Office Visit from 05/20/2023 in [facility] Office Visit from 03/11/2023 in [facility] Office Visit from 12/02/2022 in [facility] Office Visit from 10/17/2022 in [facility]  Total GAD-7 Score 5 3 9 12 6    Mini-Mental    Flowsheet Row  ECT  Treatment from 08/16/2022 in [facility] Admission (Discharged) from 03/13/2022 in [facility] BEHAVIORAL MEDICINE  Total Score (max 30 points ) 30 22   PHQ2-9    Flowsheet Row Office Visit from 11/06/2023 in [facility] Office Visit from 09/09/2023 in [facility] Office Visit from 07/15/2023 in [facility] Office Visit from 05/20/2023 in [facility] Office Visit from 03/11/2023 in [facility]  PHQ-2 Total Score 2 2 4 2 2   PHQ-9 Total Score 6 7 8 8 5    Flowsheet Row Office Visit from 07/15/2023 in [facility] ED to Hosp-Admission (Discharged) from 06/25/2023 in [facility] (1A) Office Visit from 12/02/2022 in [facility]  C-SSRS RISK CATEGORY Error: Question 6 not populated No Risk No Risk       07/01/2024    5:07 PM 03/11/2023    9:58 AM  Montreal Cognitive Assessment   Visuospatial/ Executive (0/5) 4 2  Naming (0/3) 3 3  Attention: Read list of digits (0/2) 2 2  Attention: Read list of letters (0/1) 1 0  Attention: Serial 7 subtraction starting at 100 (0/3) 3 3  Language: Repeat phrase (0/2) 2 2  Language : Fluency (0/1) 1 0  Abstraction (0/2) 2 2  Delayed Recall (0/5) 0 0  Orientation (0/6) 6 6  Total 24 20  Adjusted Score (based on education)  20     Assessment and Plan:  Jaise Moser is a 83 y.o. year old male with a history of depression, diabetes, hypertension, PAfib, sleep apnea, subdural hematoma, epidural hematoma in 1958,  seizure disorder (no episode for the past five years, not on anticonvulsant), hip fracture,  right femur after mechanical fall s/p right hip arthroplasty on 6/11, who presents for follow up appointment for below.    1. MDD (major depressive disorder), recurrent, in partial remission (HCC) 2. Anxiety disorder, unspecified type Acute stressors include: concern about the rental property, financial strain, hip fracture, newly found Afib,recent admission due to osteomyelitis  Other stressors include: head injury as a teenager s/p removal of the hematoma   History:good response to ECT 2023 (concern about memory loss), admitted to [facility] in May 2023 for depression, SI, and Sept 2023 for depression (initially confused and non verbal).   The exam is notable for bright affect, and there has been consistent improvement in spontaneous speech.  While he continues to report self-limited anxiety, and down mood in the morning, no other significant mood symptoms otherwise.  Will taper down olanzapine  slowly to mitigate risk of metabolic side effect and drowsiness.  Provided psychoeducation about possible risk of relapse in his mood symptoms.  Will continue sertraline  to target depression, anxiety, along with bupropion  as adjunctive treatment for depression. Noted that he has been maintained on the current dose of bupropion  since his last admission, while he has history of seizure.   3. Cognitive impairment Functional Status   IADL: Independent in the following:            Requires assistance with the following: managing finances, medications, driving ADL  Independent in the following: bathing and hygiene, feeding, continence, grooming and toileting, walking (walker)          Requires assistance with the following: Folate, Vitamin B12 255 01/2023,  (TSH 11/2022 wnl) Images: Head MRI 07/2022- 1. No acute intracranial abnormality.  2. Progressed chronic small vessel disease since a 2016 MRI, including fairly numerous chronic micro hemorrhages in both thalami. 3. Prior left frontal craniotomy/cranioplasty with subtle chronic encephalomalacia of underlying frontal gyri. Neuropsych assessment: SLUMS 15/30 06/2022, MOCA 20/30 03/11/2023,  mainly  had issues in visuospatial, delayed recall (able to recall with cue 2/5), MOCA 24/30 06/2024 (-5 for memory, with/without cues) Etiology: r/o vascular, depression, s/p ECT, TBI (h/o epidural hematoma s/p craniotomy in 1958)  There has been overall improvement in paucity of thoughts, which is also reflected in the [facility] evaluation/improvement in scores.   Will continue to monitor and intervene as indicated.  While he may benefit from Aricept, will not start this medication due to possible adverse reaction of bradycardia.  Psychoeducation was provided at length regarding cognitive exercise, and the possible reason of this improvement.  He agrees to continue working on Animator, social activities, and a healthy diet.   # Insomnia Overall improving.  He was previously on trazodone  .  Will consider restarting if any worsening.  Will continue current dose of olanzapine  to target the mood symptoms as above, and to help for insomnia.   # generalized weakness He reports weakness upon walking, though he denies any fall or drowsiness.  He agrees to reach out to consider possible PT referral.    # high risk medication use     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 (Olanzapine  reduced 10/2023)  Lipid panels LDL 80 02/2022-Due  HbA1c 5.4 10/2023   Advised to obtain lipid panels at his next PCP visit     Plan  Continue sertraline  150 mg at night  Continue bupropion  300 mg daily  Decrease olanzapine  2.5 mg at night, taking on Mon, Wed, Fri. (tapered down from daily 06/2024, from 5 mg daily 01/2024, down from 10 mg on 08/2023)  Next appointment:  10/30 at 3:30, IP - may consider referral for medical review program at [facility] - he sees a therapist, Rico Kato, in [place] every other way   The patient demonstrates the following risk factors for suicide: Chronic risk factors for suicide include: psychiatric disorder of depression and previous suicide attempts of putting a bag over his  head many years ago, but then he took it off. . Acute risk factors for suicide include: unemployment and loss (financial, interpersonal, professional). Protective factors for this patient include: positive social support and hope for the future. Considering these factors, the overall suicide risk at this point appears to be low. Patient is appropriate for outpatient follow up.   This visit involved a longitudinal and complex condition requiring extended medical decision-making, coordination of care, and management beyond what is typically captured in CPT 99214. The complexity of the patient's condition justifies the use of G2211.    Collaboration of Care: Collaboration of Care: Other reviewed notes in Epic  Patient/Guardian was advised Release of Information must be obtained prior to any record release in order to collaborate their care with an outside provider. Patient/Guardian was advised if they have not already done so to contact the registration department to sign all necessary forms in order for us  to release information regarding their care.   Consent: Patient/Guardian gives verbal consent for treatment and assignment of benefits for services provided during this visit. Patient/Guardian expressed understanding and agreed to proceed.    Katheren Sleet, MD 07/02/2024, 10:28 AM

## 2024-07-01 ENCOUNTER — Ambulatory Visit (INDEPENDENT_AMBULATORY_CARE_PROVIDER_SITE_OTHER): Admitting: Psychiatry

## 2024-07-01 ENCOUNTER — Encounter: Payer: Self-pay | Admitting: Psychiatry

## 2024-07-01 VITALS — BP 124/78 | HR 67 | Temp 98.7°F | Ht 74.0 in | Wt 201.0 lb

## 2024-07-01 DIAGNOSIS — R4189 Other symptoms and signs involving cognitive functions and awareness: Secondary | ICD-10-CM

## 2024-07-01 DIAGNOSIS — F3341 Major depressive disorder, recurrent, in partial remission: Secondary | ICD-10-CM | POA: Diagnosis not present

## 2024-07-01 DIAGNOSIS — F419 Anxiety disorder, unspecified: Secondary | ICD-10-CM | POA: Diagnosis not present

## 2024-07-01 MED ORDER — OLANZAPINE 2.5 MG PO TABS: 2.5000 mg | ORAL_TABLET | ORAL | 0 refills | Status: DC

## 2024-07-01 MED ORDER — BUPROPION HCL ER (XL) 300 MG PO TB24: 300.0000 mg | ORAL_TABLET | Freq: Every day | ORAL | 1 refills | Status: AC

## 2024-07-01 NOTE — Patient Instructions (Signed)
 Continue sertraline  150 mg at night  Continue bupropion  300 mg daily  Decrease olanzapine  2.5 mg at night  (Mon, Wed, Fri)  Next appointment:  10/30 at 3:30,

## 2024-07-02 ENCOUNTER — Other Ambulatory Visit: Payer: Self-pay | Admitting: Surgery

## 2024-07-02 DIAGNOSIS — I6523 Occlusion and stenosis of bilateral carotid arteries: Secondary | ICD-10-CM

## 2024-07-02 NOTE — Addendum Note (Signed)
 Addended by: Dessie Delcarlo on: 07/02/2024 10:28 AM   Modules accepted: Level of Service

## 2024-07-02 NOTE — Addendum Note (Signed)
 Addended by: Genora Arp on: 07/02/2024 10:42 AM   Modules accepted: Level of Service

## 2024-07-12 ENCOUNTER — Encounter: Payer: Self-pay | Admitting: Physician Assistant

## 2024-07-12 ENCOUNTER — Ambulatory Visit (INDEPENDENT_AMBULATORY_CARE_PROVIDER_SITE_OTHER): Admitting: Physician Assistant

## 2024-07-12 ENCOUNTER — Ambulatory Visit: Admitting: Physician Assistant

## 2024-07-12 VITALS — BP 104/62 | HR 71 | Ht 74.0 in | Wt 195.0 lb

## 2024-07-12 DIAGNOSIS — R3915 Urgency of urination: Secondary | ICD-10-CM

## 2024-07-12 NOTE — Progress Notes (Signed)
 07/12/2024 2:22 PM   John [name] 11/14/1940 [id number]  CC: Chief Complaint  Patient presents with   Urinary Urgency   HPI: John [name] is a 83 y.o. 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   Today he reports decreased urinary urgency with PTNS.  He still has some a.m. frequency.  Overall, he thinks it helped.  Reported symptoms as follows:  PTNS #1 PTNS #12  Daytime voids 3 3-4  Nocturia 3 2-3  Urge severity strong mild  Incontinence episodes per day Unknown, wears Depends 0    PMH: Past Medical History:  Diagnosis Date   Cancer (HCC)    Depression    Depression    Diabetes mellitus without complication (HCC)    type 2   Dissection of carotid artery (HCC)    a. 06/2015 CT Head: abnl thickening of mid-dist R cervical ICA w/o narrowing - ? vasculitis and thrombosed/healing dissection; b. 01/2021 Carotid U/S: 1-39% bilat ICA stenoses.   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 Age 66 - struck in head w/ baseball --> s/p craniotomy.   Epidural hematoma (HCC)    H/O Osgood-Schlatter disease    Hand deformity, congenital    Hearing loss bilateral   Hemorrhoids    Horner syndrome    Hypertension    Migraine    Obesity    PONV (postoperative nausea and vomiting)    Prostate cancer (HCC) 2020   Rosacea    SDH (subdural hematoma) (HCC)    Sleep apnea    Spontaneous Subdural hematoma (HCC)    a. Approx 2015 - eval in [place] - conservatively managed.    Surgical History: Past Surgical History:  Procedure Laterality Date   ANKLE ARTHROSCOPY Left    fracture   CHOLECYSTECTOMY  2007   CRANIOTOMY  1958   left frontal craniotomy for epidural hematoma   HIP ARTHROPLASTY Right 04/07/2022   Procedure: ARTHROPLASTY BIPOLAR HIP  (HEMIARTHROPLASTY);  Surgeon: Cleotilde Barrio, MD;  Location: [facility] ORS;  Service: Orthopedics;  Laterality: Right;   INSERTION OF MESH  01/02/2022   Procedure: INSERTION OF MESH;  Surgeon: Rodolph Romano, MD;  Location: [facility];  Service: General;;   IRRIGATION AND DEBRIDEMENT FOOT Left 06/27/2023   Procedure: IRRIGATION AND DEBRIDEMENT FOOT;  Surgeon: Ashley Soulier, DPM;  Location: [facility];  Service: Orthopedics/Podiatry;  Laterality: Left;   LIGAMENT REPAIR Right    thumb   METATARSAL HEAD EXCISION Left 06/27/2023   Procedure: EXCISION OF BONE IN GREAT TOE JOINT;  Surgeon: Ashley Soulier, DPM;  Location: [facility];  Service: Orthopedics/Podiatry;  Laterality: Left;   RETINAL DETACHMENT SURGERY     TREATMENT FISTULA ANAL  2006    Home Medications:  Allergies as of 07/12/2024   No Known Allergies      Medication List        Accurate as of July 12, 2024  2:22 PM. If you have any questions, ask your nurse or doctor.          acetaminophen  325 MG tablet Commonly known as: TYLENOL  Take 2 tablets (650 mg total) by mouth every 6 (six) hours as needed for mild pain (or Fever >/= 101).   aspirin  EC 81 MG tablet Take 81  mg by mouth daily.   buPROPion  300 MG 24 hr tablet Commonly known as: WELLBUTRIN  XL Take 1 tablet (300 mg total) by mouth daily. Start taking on: July 28, 2024   docusate sodium  100 MG capsule Commonly known as: COLACE Take 1 capsule (100 mg total) by mouth 2 (two) times daily.   OLANZapine  2.5 MG tablet Commonly known as: ZYPREXA  Take 1 tablet (2.5 mg total) by mouth every other day. Start taking on: July 14, 2024   Pfizerpen 20000000 units injection Generic drug: penicillin G potassium INJECT 0.3 ML INTO THE MUSCLE ONCE FOR 1 DOSE   sertraline  50 MG tablet Commonly known as: ZOLOFT  Take 1 tablet (50 mg total) by mouth at bedtime. Total of 150 mg at night. Take along with 100 mg tab   sertraline  100 MG tablet Commonly known as:  ZOLOFT  Take 1 tablet (100 mg total) by mouth at bedtime. Total of 150 mg at night. Take along with 50 mg tab   tamsulosin  0.4 MG Caps capsule Commonly known as: FLOMAX  Take 1 capsule (0.4 mg total) by mouth in the morning and at bedtime.        Allergies:  No Known Allergies  Family History: Family History  Problem Relation Age of Onset   Osteoporosis Mother    Depression Mother    Prostate cancer Father    Hypertension Father    Diabetes Brother    Kidney disease Neg Hx     Social History:   reports that he has never smoked. He has never used smokeless tobacco. He reports that he does not currently use alcohol. He reports that he does not use drugs.  Physical Exam: BP 104/62 (BP Location: Left Arm, Patient Position: Sitting, Cuff Size: Normal)   Pulse 71   Ht 6' 2 (1.88 m)   Wt 195 lb (88.5 kg)   BMI 25.04 kg/m   Constitutional:  Alert and oriented, no acute distress, nontoxic appearing HEENT: , AT Cardiovascular: No clubbing, cyanosis, or edema Respiratory: Normal respiratory effort, no increased work of breathing Skin: No rashes, bruises or suspicious lesions Neurologic: Grossly intact, no focal deficits, moving all 4 extremities Psychiatric: Normal mood and affect  Assessment & Plan:   1. Urinary urgency (Primary) Improved urinary urgency with PTNS.  Since this was the primary reason for pursuing PTNS, we discussed that this is a positive therapeutic outcome.  He would like to continue with maintenance treatments, which is reasonable.  Return in about 3 days (around 07/15/2024) for PTNS maintenance.  Lucie Hones, PA-C  [facility] [place] [address], [address] [place], [place] [address] [phone]

## 2024-07-14 ENCOUNTER — Ambulatory Visit (INDEPENDENT_AMBULATORY_CARE_PROVIDER_SITE_OTHER): Admitting: Physician Assistant

## 2024-07-14 DIAGNOSIS — R3915 Urgency of urination: Secondary | ICD-10-CM | POA: Diagnosis not present

## 2024-07-14 NOTE — Progress Notes (Signed)
 PTNS  Session # 13  Health & Social Factors: no change Caffeine: 0 Alcohol: 0 Daytime voids #per day: 3 Night-time voids #per night: 1 Urgency: mild Incontinence Episodes #per day: 0 Ankle used: right Treatment Setting: 19 Feeling/ Response: sensory Comments: Patient tolerated well.  Performed By: Qamar Rosman, PA-C   Follow Up: 1 month

## 2024-07-22 ENCOUNTER — Ambulatory Visit: Admitting: Physician Assistant

## 2024-07-24 ENCOUNTER — Other Ambulatory Visit: Payer: Self-pay | Admitting: Psychiatry

## 2024-07-25 ENCOUNTER — Other Ambulatory Visit: Payer: Self-pay | Admitting: Psychiatry

## 2024-07-25 NOTE — Telephone Encounter (Signed)
 I believe they used to get medication monthly in the past. Please ask if they would like 90 day prescription.

## 2024-07-26 NOTE — Telephone Encounter (Addendum)
 Spoke to patient he stated that he would like to continue getting his Sertraline  100 mg every 30 days

## 2024-08-02 ENCOUNTER — Ambulatory Visit (INDEPENDENT_AMBULATORY_CARE_PROVIDER_SITE_OTHER): Admitting: Physician Assistant

## 2024-08-02 ENCOUNTER — Ambulatory Visit (HOSPITAL_COMMUNITY)
Admission: RE | Admit: 2024-08-02 | Discharge: 2024-08-02 | Disposition: A | Discharge status: Home / Self Care | Source: Ambulatory Visit | Attending: Surgery | Admitting: Surgery

## 2024-08-02 VITALS — BP 137/89 | HR 62 | Temp 98.0°F | Wt 202.0 lb

## 2024-08-02 DIAGNOSIS — I6523 Occlusion and stenosis of bilateral carotid arteries: Secondary | ICD-10-CM | POA: Insufficient documentation

## 2024-08-02 NOTE — Progress Notes (Unsigned)
 Office Note   History of Present Illness   John [name] is a 83 y.o. (28-Sep-1941) male who presents for follow-up.  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  He was treated by an outside facility with 6 months of Plavix, followed by baby aspirin  daily.  The patient returns today for follow up. He/She denies any recent CVA or TIA diagnosis. The patient also denies any recent strokelike symptoms such as slurred speech, facial droop, sudden visual changes, or sudden weakness/numbness.  Current Outpatient Medications  Medication Sig Dispense Refill   acetaminophen  (TYLENOL ) 325 MG tablet Take 2 tablets (650 mg total) by mouth every 6 (six) hours as needed for mild pain (or Fever >/= 101).     aspirin  EC 81 MG tablet Take 81 mg by mouth daily.     buPROPion  (WELLBUTRIN  XL) 300 MG 24 hr tablet Take 1 tablet (300 mg total) by mouth daily. 90 tablet 1   docusate sodium  (COLACE) 100 MG capsule Take 1 capsule (100 mg total) by mouth 2 (two) times daily. 60 capsule 3   OLANZapine  (ZYPREXA ) 2.5 MG tablet Take 1 tablet (2.5 mg total) by mouth every other day. 45 tablet 0   penicillin G potassium (PFIZERPEN) 20000000 units injection INJECT 0.3 ML INTO THE MUSCLE ONCE FOR 1 DOSE     sertraline  (ZOLOFT ) 100 MG tablet Take 1 tablet (100 mg total) by mouth at bedtime. Total of 150 mg at night. Take along with 50 mg tab 30 tablet 5   sertraline  (ZOLOFT ) 50 MG tablet Take 1 tablet (50 mg total) by mouth at bedtime. Total of 150 mg at night. Take along with 100 mg tab 90 tablet 1   tamsulosin  (FLOMAX ) 0.4 MG CAPS capsule Take 1 capsule (0.4 mg total) by mouth in the morning and at bedtime. 60 capsule 11   No current facility-administered medications for this visit.    ***REVIEW OF SYSTEMS (negative unless checked):   Cardiac:  []  Chest pain or chest pressure? []  Shortness of breath upon activity? []  Shortness of breath when lying flat? []   Irregular heart rhythm?  Vascular:  []  Pain in calf, thigh, or hip brought on by walking? []  Pain in feet at night that wakes you up from your sleep? []  Blood clot in your veins? []  Leg swelling?  Pulmonary:  []  Oxygen at home? []  Productive cough? []  Wheezing?  Neurologic:  []  Sudden weakness in arms or legs? []  Sudden numbness in arms or legs? []  Sudden onset of difficult speaking or slurred speech? []  Temporary loss of vision in one eye? []  Problems with dizziness?  Gastrointestinal:  []  Blood in stool? []  Vomited blood?  Genitourinary:  []  Burning when urinating? []  Blood in urine?  Psychiatric:  []  Major depression  Hematologic:  []  Bleeding problems? []  Problems with blood clotting?  Dermatologic:  []  Rashes or ulcers?  Constitutional:  []  Fever or chills?  Ear/Nose/Throat:  []  Change in hearing? []  Nose bleeds? []  Sore throat?  Musculoskeletal:  []  Back pain? []  Joint pain? []  Muscle pain?   Physical Examination  *** Vitals:   08/02/24 1404 08/02/24 1407  BP: (!) 134/94 137/89  Pulse: 67 62  Temp: 98 F (36.7 C)   TempSrc: Temporal   Weight: 202 lb (91.6 kg)    ***Body mass index is 25.94 kg/m.  General:  WDWN in NAD; vital signs documented above Gait: Not observed HENT: WNL, normocephalic Pulmonary: normal  non-labored breathing , without rales, rhonchi,  wheezing Cardiac: {Desc; regular/irreg:14544} HR, without murmurs {With/Without:20273} carotid bruit*** Abdomen: soft, NT, no masses Skin: {With/Without:20273} rashes Vascular Exam/Pulses: palpable radial pulses bilaterally Extremities: {With/Without:20273} ischemic changes, {With/Without:20273} gangrene , {With/Without:20273} cellulitis; {With/Without:20273} open wounds;  Musculoskeletal: no muscle wasting or atrophy  Neurologic: A&O X 3;  No focal weakness or paresthesias are detected Psychiatric:  The pt has {Desc; normal/abnormal:11317::Normal} affect.  Non-Invasive Vascular  Imaging   Bilateral Carotid Duplex (08/02/2024):  R ICA stenosis:  1-39% R VA:  patent and antegrade L ICA stenosis:  1-39% L VA:  patent and antegrade   Medical Decision Making   Fields Oros is a 83 y.o. male who presents for surveillance of carotid artery stenosis  Based on the patient's vascular studies, their carotid artery stenosis is *** She/he denies any strokelike symptoms such as slurred speech, facial droop, sudden visual changes, or sudden weakness/numbness. No recent diagnosis of CVA or TIA. She/he has no neuro deficits on exam. There are palpable and equal radial pulses bilaterally They will continue their *** and follow up with our office in *** months/year with carotid duplex   Ahmed Holster PA-C [facility] Office: [phone]  Clinic MD: ***

## 2024-08-16 ENCOUNTER — Ambulatory Visit (INDEPENDENT_AMBULATORY_CARE_PROVIDER_SITE_OTHER): Admitting: Physician Assistant

## 2024-08-16 DIAGNOSIS — R3915 Urgency of urination: Secondary | ICD-10-CM | POA: Diagnosis not present

## 2024-08-16 NOTE — Progress Notes (Signed)
 Chief Complaint:   Chief Complaint  Patient presents with  . Follow-up    Discuss referral for neck pain    Subjective:  John [name] is a 83 y.o. male in today for neck pain.  Says it started about 2 months ago.  Only hurts when he turns his head to the right.  No radiating pain down the arm, no numbness, tingling or loss of strength.  He can't recall any triggering events. He did have neck pain and clicking when he turned his neck about a year ago and saw [facility].  Xrays reportedly were done there and symptoms improved after a short time.  Past Medical History:  Diagnosis Date  . Depression   . Eczema, unspecified   . Hand deformity, congenital   . Hearing loss    Bilateral  . Hemorrhoids   . History of chicken pox   . Hx of migraines   . Hx of Osgood-Schlatter disease   . Hypertension   . Rosacea   . Sleep apnea    Past Surgical History:  Procedure Laterality Date  . TREATMENT FISTULA ANAL  2006  . CHOLECYSTECTOMY  2007  . UMBILICAL HERNIA REPAIR  01/02/2022   Dr Lucas Catchings  -- ROBOTIC  . ARTHROSCOPY ANKLE FOR REPAIR FRACTURE    . CHOLECYSTECTOMY OPEN    . REPAIR RETINAL DETACHMENT BY AIR/GAS    . Ruptured thumb ligament     s/p skiing accident  . SUBDURAL HEMATOMA EVACUATION VIA CRANIOTOMY     Secondary to trauma at age 78   Family History  Problem Relation Name Age of Onset  . Osteoporosis (Thinning of bones) Mother    . Depression Mother    . Prostate cancer Father    . High blood pressure (Hypertension) Father     Social History   Socioeconomic History  . Marital status: Married  Tobacco Use  . Smoking status: Never  . Smokeless tobacco: Never  Vaping Use  . Vaping status: Never Used  Substance and Sexual Activity  . Alcohol use: Yes    Comment: Infrequent  . Drug use: No  . Sexual activity: Never  Social History Narrative   Zostavax:07/2013   Social Drivers of Health   Financial Resource Strain: Low Risk  (12/11/2023)   Overall  Financial Resource Strain (CARDIA)   . Difficulty of Paying Living Expenses: Not very hard  Food Insecurity: No Food Insecurity (12/11/2023)   Hunger Vital Sign   . Worried About Programme Researcher, Broadcasting/film/video in the Last Year: Never true   . Ran Out of Food in the Last Year: Never true  Transportation Needs: No Transportation Needs (12/11/2023)   PRAPARE - Transportation   . Lack of Transportation (Medical): No   . Lack of Transportation (Non-Medical): No  Housing Stability: Low Risk  (07/14/2024)   Housing Stability Vital Sign   . Unable to Pay for Housing in the Last Year: No   . Number of Times Moved in the Last Year: 0   . Homeless in the Last Year: No   Outpatient Medications Prior to Visit  Medication Sig Dispense Refill  . acetaminophen  (TYLENOL ) 325 MG tablet Take 650 mg by mouth every 6 (six) hours as needed    . aspirin  81 MG EC tablet Take 81 mg by mouth once daily.    . buPROPion  (WELLBUTRIN  XL) 300 MG XL tablet Take 1 tablet by mouth once daily    . docusate (COLACE) 100 MG capsule Take 100  mg by mouth at bedtime Patient taking 1 nightly.    . OLANZapine  (ZYPREXA ) 10 MG tablet Take 2.5 mg by mouth at bedtime    . sertraline  (ZOLOFT ) 50 MG tablet Take 100 mg by mouth at bedtime Taking 150mg . A day    . SUMAtriptan (IMITREX) 100 MG tablet TAKE 1 TABLET BY MOUTH ONCE AS NEEDED FOR MIGRAINE. MAY REPEAT 2ND DOSE AFTER 2 HOUR IF NEEDED 9 tablet 2  . tamsulosin  (FLOMAX ) 0.4 mg capsule TAKE 1 CAPSULE BY MOUTH EVERY DAY 90 capsule 0  . blood glucose meter kit 2 (two) times daily before meals 1 each 1  . blood sugar diagnostic, disc (BLOOD GLUCOSE DIAGNOSTIC, DISC) test strip Use once daily Check blood sugar in the am fasting. E11.9 ACCU-CHECK GUIDE 30 each 5  . lancets once daily Check glucose in the am fasting  (E11.9 Accu Check) (Patient not taking: Reported on 08/16/2024) 30 each 5  . PFIZERPEN-G injection INJECT 0.3 ML INTO THE MUSCLE ONCE FOR 1 DOSE (Patient not taking: Reported on  08/16/2024) 0.3 mL 0  . polyethylene glycol (MIRALAX ) packet Take 17 g by mouth once daily as needed    . traZODone  (DESYREL ) 50 MG tablet Take 50 mg by mouth at bedtime as needed (Patient not taking: Reported on 08/16/2024)     No facility-administered medications prior to visit.   No Known Allergies  Objective:   Vitals:   08/16/24 1427  BP: 124/76  Pulse: 65  SpO2: 97%  Weight: 93.4 kg (206 lb)  Height: 182.9 cm (6' 0.01)  PainSc:   5  PainLoc: Neck   Body mass index is 27.93 kg/m.   GENERAL:  Patient is alert and oriented, in no acute distress.  No tenderness over the neck or paraspinal muscles.  Somewhat reduced ROM with turning, but fairly symmetric to the left or right.  Normal strength in the arms.  Assessment/Plan:  Neck pain.  Will start PT and see how he does.  Consider repeat imaging at Ortho, or here if desired, if symptoms persist.  He'll let meknow if he wants to try muscle relaxants again.  He has a whitish film in his right ear canal.  Should follow up with ENT/Audiology for ear issues and hearing aids.  Follow up in 3 months for recheck as scheduled, sooner as needed for acute concerns.   Alm Na, MD, PhD

## 2024-08-16 NOTE — Progress Notes (Signed)
 [facility]  Session # 14  Health & Social Factors: some increased frequency Caffeine: 0 Alcohol: 0 Daytime voids #per day: 3 Night-time voids #per night: 2-3 Urgency: mild Incontinence Episodes #per day: 2 Ankle used: Right Treatment Setting: 5 Feeling/ Response: sensory Comments: Patient tolerated well.  Performed By: Samantha Vaillancourt, PA-C   Follow Up: 1 month

## 2024-08-22 NOTE — Progress Notes (Unsigned)
 BH MD/PA/NP OP Progress Note  08/26/2024 4:14 PM John [name]  MRN:  [id number]  Chief Complaint:  Chief Complaint  Patient presents with   Follow-up   HPI:  This is a follow-up appointment for depression, anxiety and insomnia.  He states that he feels better as the day goes on.  He feels more like himself later in the evening.  He feels overwhelmed in the morning.  He states that his son will move in this weekend (his wife corrects that he will come next week).  He states that his focus has been the same.  He is able to concentrate well when he is involved, that he tends to be worried if he is not doing anything.  He wonders if he has changed permanently.  He feels it has been few years since being to the hospital.  Although he knows there has been some progress, it is difficult for him to see the change.  He states that he is having issues with hearing aids.  He misses some words  He will be seen by audiologist.  He is planning to talk about the hearing aids available in the market.  He did some review on the Internet.  He sleeps 10 pm-2 am.  He denies taking a nap.  He enjoys reading the news.  He does not do things related to photography as it could be upsetting.  Provided psychoeducation about behavioral activation.  He agrees to consider taking a walk more frequently.  He denies feeling depressed.  He has anxiety symptoms as in GAD 7.  He denies SI, HI, hallucinations.  He denies any fall.  He has not noticed any difference since reducing the frequency of olanzapine .  They ask if aspirin , docusate can be taken.  They were informed to consult his primary care about these medication.    Wt Readings from Last 3 Encounters:  08/26/24 204 lb 3.2 oz (92.6 kg)  08/02/24 202 lb (91.6 kg)  07/12/24 195 lb (88.5 kg)     Visit Diagnosis:    ICD-10-CM   1. MDD (major depressive disorder), recurrent, in partial remission  F33.41     2. Anxiety disorder, unspecified type  F41.9     3.  Cognitive impairment  R41.89       Past Psychiatric History: Please see initial evaluation for full details. I have reviewed the history. No updates at this time.     Past Medical History:  Past Medical History:  Diagnosis Date   Cancer (HCC)    Depression    Depression    Diabetes mellitus without complication (HCC)    type 2   Dissection of carotid artery    a. 06/2015 CT Head: abnl thickening of mid-dist R cervical ICA w/o narrowing - ? vasculitis and thrombosed/healing dissection; b. 01/2021 Carotid U/S: 1-39% bilat ICA stenoses.   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 Age 28 - struck in head w/ baseball --> s/p craniotomy.   Epidural hematoma (HCC)    H/O Osgood-Schlatter disease    Hand deformity, congenital    Hearing loss bilateral   Hemorrhoids    Horner syndrome    Hypertension    Migraine    Obesity    PONV (postoperative nausea and vomiting)    Prostate cancer (HCC) 2020   Rosacea    SDH (subdural hematoma) (HCC)    Sleep apnea    Spontaneous Subdural hematoma (HCC)  a. Approx 2015 - eval in [place] - conservatively managed.    Past Surgical History:  Procedure Laterality Date   ANKLE ARTHROSCOPY Left    fracture   CHOLECYSTECTOMY  2007   CRANIOTOMY  1958   left frontal craniotomy for epidural hematoma   HIP ARTHROPLASTY Right 04/07/2022   Procedure: ARTHROPLASTY BIPOLAR HIP (HEMIARTHROPLASTY);  Surgeon: Cleotilde Barrio, MD;  Location: [facility];  Service: Orthopedics;  Laterality: Right;   INSERTION OF MESH  01/02/2022   Procedure: INSERTION OF MESH;  Surgeon: Rodolph Romano, MD;  Location: [facility];  Service: General;;   IRRIGATION AND DEBRIDEMENT FOOT Left 06/27/2023   Procedure: IRRIGATION AND DEBRIDEMENT FOOT;  Surgeon: Ashley Soulier, DPM;  Location: [facility];  Service: Orthopedics/Podiatry;  Laterality: Left;   LIGAMENT REPAIR Right    thumb   METATARSAL HEAD EXCISION Left 06/27/2023   Procedure: EXCISION OF BONE IN GREAT TOE  JOINT;  Surgeon: Ashley Soulier, DPM;  Location: [facility];  Service: Orthopedics/Podiatry;  Laterality: Left;   RETINAL DETACHMENT SURGERY     TREATMENT FISTULA ANAL  2006    Family Psychiatric History: Please see initial evaluation for full details. I have reviewed the history. No updates at this time.     Family History:  Family History  Problem Relation Age of Onset   Osteoporosis Mother    Depression Mother    Prostate cancer Father    Hypertension Father    Diabetes Brother    Kidney disease Neg Hx     Social History:  Social History   Socioeconomic History   Marital status: Married    Spouse name: Niels   Number of children: 2   Years of education: Not on file   Highest education level: Master's degree (e.g., MA, MS, MEng, MEd, MSW, MBA)  Occupational History   Not on file  Tobacco Use   Smoking status: Never   Smokeless tobacco: Never  Vaping Use   Vaping status: Never Used  Substance and Sexual Activity   Alcohol use: Not Currently   Drug use: No   Sexual activity: Yes    Birth control/protection: None  Other Topics Concern   Not on file  Social History Narrative   Live at home with wife.    Social Drivers of Corporate Investment Banker Strain: Low Risk  (12/11/2023)   Received from [facility]   Overall Financial Resource Strain (CARDIA)    Difficulty of Paying Living Expenses: Not very hard  Food Insecurity: No Food Insecurity (12/11/2023)   [facility] from [facility]   Hunger Vital Sign    Within the past 12 months, you worried that your food would run out before you got the money to buy more.: Never true    Within the past 12 months, the food you bought just didn't last and you didn't have money to get more.: Never true  Transportation Needs: No Transportation Needs (12/11/2023)   Received from [facility] - Transportation    In the past 12 months, has lack of transportation kept you  from medical appointments or from getting medications?: No    Lack of Transportation (Non-Medical): No  Physical Activity: Not on file  Stress: Not on file  Social Connections: Not on file    Allergies: No Known Allergies  Metabolic Disorder Labs: Lab Results  Component Value Date   HGBA1C 5.6 03/19/2022   MPG 114.02 03/19/2022   No results found for: PROLACTIN  Lab Results  Component Value Date   CHOL 131 03/19/2022   TRIG 92 03/19/2022   HDL 33 (L) 03/19/2022   CHOLHDL 4.0 03/19/2022   VLDL 18 03/19/2022   LDLCALC 80 03/19/2022   Lab Results  Component Value Date   TSH 0.766 04/01/2022    Therapeutic Level Labs: No results found for: LITHIUM No results found for: VALPROATE No results found for: CBMZ  Current Medications: Current Outpatient Medications  Medication Sig Dispense Refill   acetaminophen  (TYLENOL ) 325 MG tablet Take 2 tablets (650 mg total) by mouth every 6 (six) hours as needed for mild pain (or Fever >/= 101).     aspirin  EC 81 MG tablet Take 81 mg by mouth daily.     buPROPion  (WELLBUTRIN  XL) 300 MG 24 hr tablet Take 1 tablet (300 mg total) by mouth daily. 90 tablet 1   docusate sodium  (COLACE) 100 MG capsule Take 1 capsule (100 mg total) by mouth 2 (two) times daily. 60 capsule 3   [START ON 10/12/2024] OLANZapine  (ZYPREXA ) 2.5 MG tablet Take 1 tablet (2.5 mg total) by mouth every other day. 45 tablet 0   penicillin G potassium (PFIZERPEN) 20000000 units injection INJECT 0.3 ML INTO THE MUSCLE ONCE FOR 1 DOSE (Patient not taking: Reported on 08/16/2024)     sertraline  (ZOLOFT ) 100 MG tablet Take 1 tablet (100 mg total) by mouth at bedtime. Total of 150 mg at night. Take along with 50 mg tab 30 tablet 5   [START ON 09/20/2024] sertraline  (ZOLOFT ) 50 MG tablet Take 1 tablet (50 mg total) by mouth at bedtime. Total of 150 mg at night. Take along with 100 mg tab 90 tablet 1   tamsulosin  (FLOMAX ) 0.4 MG CAPS capsule Take 1 capsule (0.4 mg total) by  mouth in the morning and at bedtime. 60 capsule 11   No current facility-administered medications for this visit.     Musculoskeletal: Strength & Muscle Tone: within normal limits Gait & Station: normal Patient leans: N/A  Psychiatric Specialty Exam: Review of Systems  Psychiatric/Behavioral:  Positive for sleep disturbance. Negative for agitation, behavioral problems, confusion, decreased concentration, dysphoric mood, hallucinations, self-injury and suicidal ideas. The patient is nervous/anxious. The patient is not hyperactive.   All other systems reviewed and are negative.   Blood pressure 135/82, pulse 65, temperature 97.7 F (36.5 C), temperature source Temporal, height 6' 2 (1.88 m), weight 204 lb 3.2 oz (92.6 kg).Body mass index is 26.22 kg/m.  General Appearance: Well Groomed  Eye Contact:  Good  Speech:  Clear and Coherent  Volume:  Normal  Mood:  good now  Affect:  Appropriate, Congruent, and calm  Thought Process:  Coherent  Orientation:  Full (Time, Place, and Person)  Thought Content: Logical   Suicidal Thoughts:  No  Homicidal Thoughts:  No  Memory:  Immediate;   Good  Judgement:  Good  Insight:  Good  Psychomotor Activity:  Normal, Normal tone, no rigidity, no resting/postural tremors, no tardive dyskinesia    Concentration:  Concentration: Good and Attention Span: Good  Recall:  Good  Fund of Knowledge: NA  Language: Good  Akathisia:  No  Handed:  Right  AIMS (if indicated): 0   Assets:  Communication Skills Desire for Improvement  ADL's:  Intact  Cognition: WNL  Sleep:  Fair   Screenings: AIMS    Flowsheet Row Admission (Discharged) from 04/11/2022 in [facility]AL [facility]  AIMS Total Score 6   AUDIT    Flowsheet Row  Admission (Discharged) from 07/04/2022 in [facility] BEHAVIORAL MEDICINE Admission (Discharged) from 04/04/2022 in [facility] BE[facility]AVIORAL MEDICINE Admission (Discharged) from 03/13/2022 in [facility] BEHAVIORAL  MEDICINE  Alcohol Use Disorder Identification Test Final Score (AUDIT) 1 0 0   ECT-MADRS    Flowsheet Row ECT Treatment from 09/06/2022 in [facility] Admission (Discharged) from 03/13/2022 in [facility] BEHAVIORAL MEDICINE  MADRS Total Score 13 42   GAD-7    Flowsheet Row Office Visit from 08/26/2024 in [facility] Office Visit from 11/06/2023 in [facility] Office Visit from 05/20/2023 in [facility] Office Visit from 03/11/2023 in [facility] Office Visit from 12/02/2022 in [facility]  Total GAD-7 Score 3 5 3 9 12    Mini-Mental    Flowsheet Row ECT Treatment from 08/16/2022 in [facility] Admission (Discharged) from 03/13/2022 in [facility] BEHAVIORAL MEDICINE  Total Score (max 30 points ) 30 22   PHQ2-9    Flowsheet Row Office Visit from 08/26/2024 in [facility] Office Visit from 11/06/2023 in [facility] Office Visit from 09/09/2023 in [facility] Office Visit from 07/15/2023 in [facility] Office Visit from 05/20/2023 in [facility]  PHQ-2 Total Score 0 2 2 4 2   PHQ-9 Total Score -- 6 7 8 8    Flowsheet Row Office Visit from 07/15/2023 in [facility] ED to Hosp-Admission (Discharged) from 06/25/2023 in [facility] (1A) Office Visit from 12/02/2022 in [facility]  C-SSRS RISK CATEGORY Error: Question 6 not populated No Risk No Risk     Assessment and Plan:  John [name] is a 83 y.o. year old male with a  history of depression, diabetes, hypertension, PAfib, sleep apnea, subdural hematoma, epidural hematoma in 1958,  seizure disorder (no episode for the past five years, not on anticonvulsant), hip fracture,  right femur after mechanical fall s/p right hip arthroplasty on 6/11, who presents for follow up appointment for below.    1. MDD (major depressive disorder), recurrent, in partial remission 2. Anxiety disorder, unspecified type Acute stressors include: concern about the rental property, financial strain, hip fracture, newly found Afib,recent admission due to osteomyelitis  Other stressors include: head injury as a teenager s/p removal of the hematoma   History:good response to ECT 2023 (concern about memory loss), admitted to [facility] in May 2023 for depression, SI, and Sept 2023 for depression (initially confused and non verbal).   Although the exam is notable for slight rumination about his condition/anxiety, there has been consistent improvement in spontaneous speech. It is noteworthy that he has taken initiative to research his hearing aid, reflecting a marked improvement from previous state.  Will continue current medication regimen.  Will continue current dose of sertraline  to target depression, anxiety and bupropion  as adjunctive treatment for depression.  Noted that he has been on the current dose since the last admission despite he has history of seizure.  Will continue the current dose of olanzapine  with a plan to slowly taper it off to reduce the long-term side effect.   3. Cognitive impairment Functional Status   IADL: Independent in the following:            Requires assistance with  the following: managing finances, medications, driving ADL  Independent in the following: bathing and hygiene, feeding, continence, grooming and toileting, walking (walker)          Requires assistance with the following: Folate, Vitamin B12 255 01/2023,  (TSH 11/2022 wnl) Images: Head MRI 07/2022- 1. No acute  intracranial abnormality. 2. Progressed chronic small vessel disease since a 2016 MRI, including fairly numerous chronic micro hemorrhages in both thalami. 3. Prior left frontal craniotomy/cranioplasty with subtle chronic encephalomalacia of underlying frontal gyri. 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   Will continue to monitor and intervene as indicated.  While he may benefit from Aricept, will not start this medication due to possible adverse reaction of bradycardia.  Psychoeducation was provided at length regarding cognitive exercise, and the possible reason of this improvement.  He agrees to continue working on animator, social activities, and a healthy diet.    # Insomnia Slight worsening in early morning awakening.  He denies any need for psychotropics.  He used to be on trazodone  in the past with good benefit; may consider restarting this medication if any worsening.  Provided psychoeducation to improve sleep hygiene.    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 (tapered down from daily 06/2024, from 5 mg daily 01/2024, down from 10 mg on 08/2023)  Next appointment:  1/6 at 3 pm, IP - may consider referral for medical review program at [facility] - he sees a therapist, Rico Kato, in [place]  every other way   The patient demonstrates the following risk factors for suicide: Chronic risk factors for suicide include: psychiatric disorder of depression and previous suicide attempts of putting a bag over his head many years ago, but then he took it off. . Acute risk factors for suicide include: unemployment and loss (financial, interpersonal, professional). Protective factors for this patient include: positive social support and hope for the future. Considering these factors, the overall suicide risk at this point appears to be low. Patient is appropriate for outpatient follow up.   Collaboration of Care: Collaboration of Care: Other reviewed notes in Epic  Patient/Guardian was advised Release of Information must be obtained prior to any record release in order to collaborate their care with an outside provider. Patient/Guardian was advised if they have not already done so to contact the registration department to sign all necessary forms in order for us  to release information regarding their care.   Consent: Patient/Guardian gives verbal consent for treatment and assignment of benefits for services provided during this visit. Patient/Guardian expressed understanding and agreed to proceed.    Katheren Sleet, MD 08/26/2024, 4:14 PM

## 2024-08-26 ENCOUNTER — Other Ambulatory Visit: Payer: Self-pay

## 2024-08-26 ENCOUNTER — Ambulatory Visit (INDEPENDENT_AMBULATORY_CARE_PROVIDER_SITE_OTHER): Admitting: Psychiatry

## 2024-08-26 ENCOUNTER — Encounter: Payer: Self-pay | Admitting: Psychiatry

## 2024-08-26 VITALS — BP 135/82 | HR 65 | Temp 97.7°F | Ht 74.0 in | Wt 204.2 lb

## 2024-08-26 DIAGNOSIS — R4189 Other symptoms and signs involving cognitive functions and awareness: Secondary | ICD-10-CM | POA: Diagnosis not present

## 2024-08-26 DIAGNOSIS — F419 Anxiety disorder, unspecified: Secondary | ICD-10-CM

## 2024-08-26 DIAGNOSIS — F3341 Major depressive disorder, recurrent, in partial remission: Secondary | ICD-10-CM | POA: Diagnosis not present

## 2024-08-26 MED ORDER — SERTRALINE HCL 50 MG PO TABS: 50.0000 mg | ORAL_TABLET | Freq: Every day | ORAL | 1 refills | Status: AC

## 2024-08-26 MED ORDER — OLANZAPINE 2.5 MG PO TABS: 2.5000 mg | ORAL_TABLET | ORAL | 0 refills | Status: AC

## 2024-08-26 NOTE — Patient Instructions (Signed)
 Continue sertraline  150 mg at night  Continue bupropion  300 mg daily  Continue olanzapine  2.5 mg at night, taking on Mon, Wed, Fri.  Next appointment:  1/6 at 3 pp

## 2024-09-13 ENCOUNTER — Ambulatory Visit (INDEPENDENT_AMBULATORY_CARE_PROVIDER_SITE_OTHER): Admitting: Physician Assistant

## 2024-09-13 DIAGNOSIS — R3915 Urgency of urination: Secondary | ICD-10-CM

## 2024-09-13 NOTE — Progress Notes (Addendum)
 PTNS  Session # 15  Health & Social Factors: Increased urgency x1-2 weeks, no dysuria Caffeine: 0 Alcohol: 0 Daytime voids #per day: 3 Night-time voids #per night: 2-3 Urgency: strong Incontinence Episodes #per day: 2 Ankle used: right Treatment Setting: 5 Feeling/ Response: sensory Comments: Patient tolerated well. Incomplete tx in the morning due to needle/device falling out. He came back in the afternoon for a complete treatment, which was done as above.  Performed By: Hubert Derstine, PA-C   Follow Up: 1 month

## 2024-10-11 ENCOUNTER — Ambulatory Visit

## 2024-10-11 DIAGNOSIS — R3915 Urgency of urination: Secondary | ICD-10-CM

## 2024-10-11 DIAGNOSIS — R350 Frequency of micturition: Secondary | ICD-10-CM

## 2024-10-11 NOTE — Progress Notes (Signed)
 PTNS  Session # 16  Health & Social Factors: more urgency and more frequency Caffeine: 0 Alcohol: 0 Daytime voids #per day: 3-4 Night-time voids #per night: 2-3 Urgency: strong Incontinence Episodes #per day: 2-3 Ankle used: right Treatment Setting: 14 Feeling/ Response: toe flex Comments: n/a  Performed By: Mathew Pinal, RN  Follow Up: 1 month for monthly treatment

## 2024-10-11 NOTE — Patient Instructions (Signed)

## 2024-10-23 ENCOUNTER — Other Ambulatory Visit: Payer: Self-pay

## 2024-10-23 ENCOUNTER — Emergency Department
Admission: EM | Admit: 2024-10-23 | Discharge: 2024-10-23 | Disposition: A | Discharge status: Home / Self Care | Attending: Emergency Medicine | Admitting: Emergency Medicine

## 2024-10-23 DIAGNOSIS — Z8546 Personal history of malignant neoplasm of prostate: Secondary | ICD-10-CM | POA: Insufficient documentation

## 2024-10-23 DIAGNOSIS — Z7982 Long term (current) use of aspirin: Secondary | ICD-10-CM | POA: Diagnosis not present

## 2024-10-23 DIAGNOSIS — Z23 Encounter for immunization: Secondary | ICD-10-CM | POA: Diagnosis not present

## 2024-10-23 DIAGNOSIS — S61215A Laceration without foreign body of left ring finger without damage to nail, initial encounter: Secondary | ICD-10-CM | POA: Diagnosis not present

## 2024-10-23 DIAGNOSIS — W260XXA Contact with knife, initial encounter: Secondary | ICD-10-CM | POA: Insufficient documentation

## 2024-10-23 DIAGNOSIS — I10 Essential (primary) hypertension: Secondary | ICD-10-CM | POA: Diagnosis not present

## 2024-10-23 DIAGNOSIS — S6992XA Unspecified injury of left wrist, hand and finger(s), initial encounter: Secondary | ICD-10-CM | POA: Diagnosis present

## 2024-10-23 DIAGNOSIS — E119 Type 2 diabetes mellitus without complications: Secondary | ICD-10-CM | POA: Diagnosis not present

## 2024-10-23 MED ORDER — TETANUS-DIPHTH-ACELL PERTUSSIS 5-2-15.5 LF-MCG/0.5 IM SUSP
0.5000 mL | Freq: Once | INTRAMUSCULAR | Status: AC
  Administered 2024-10-23: 0.5 mL via INTRAMUSCULAR
  Filled 2024-10-23: qty 0.5

## 2024-10-23 MED ORDER — LIDOCAINE-EPINEPHRINE-TETRACAINE (LET) TOPICAL GEL
3.0000 mL | Freq: Once | TOPICAL | Status: AC
  Administered 2024-10-23: 3 mL via TOPICAL
  Filled 2024-10-23: qty 3

## 2024-10-23 NOTE — ED Triage Notes (Signed)
 Pt to ED with wife for lacerations to L ring and pinkie fingers from kitchen knife today. Both currently bandaged with bleeding controlled. Last Tdap unknown.

## 2024-10-23 NOTE — Discharge Instructions (Addendum)
You have been seen in the Emergency Department (ED) today for a laceration (cut).  Please keep the cut clean but do not submerge it in the water.  It has been repaired with staples or sutures that will need to be removed in about 7-10 days. Please follow up with your doctor, an urgent care, or return to the ED for suture removal.    Please take Tylenol (acetaminophen) or Motrin (ibuprofen) as needed for discomfort as written on the box.   Please follow up with your doctor as soon as possible regarding today's emergent visit.   Return to the ED or call your doctor if you notice any signs of infection such as fever, increased pain, increased redness, pus, or other symptoms that concern you.

## 2024-10-23 NOTE — ED Provider Notes (Signed)
 "  [facility] Provider Note    Event Date/Time   First MD Initiated Contact with Patient 10/23/24 1521     (approximate)   History   Laceration   HPI  John [name] is a 83 y.o. male  with a past medical history of malignant neoplasm of prostate, type 2 diabetes, MDD, migraines, seizures, hypertension, erythrocytosis, thrombocytopenia presents to the emergency department with 3 to the left ring finger and left pinky finger after cutting his fingers with a bartending knife today.  Has been bleeding a significant amount.  He denies any other injuries.  He does take a baby aspirin  daily.  He denies any numbness or tingling of his fingers, puslike discharge, erythema or edema.  Unsure when last tetanus vaccine was.   Physical Exam   Triage Vital Signs: ED Triage Vitals  Encounter Vitals Group     BP 10/23/24 1315 (!) 127/90     Girls Systolic BP Percentile --      Girls Diastolic BP Percentile --      Boys Systolic BP Percentile --      Boys Diastolic BP Percentile --      Pulse Rate 10/23/24 1315 62     Resp 10/23/24 1315 16     Temp 10/23/24 1315 98.1 F (36.7 C)     Temp Source 10/23/24 1315 Oral     SpO2 10/23/24 1315 95 %     Weight 10/23/24 1316 200 lb (90.7 kg)     Height 10/23/24 1316 6' 2 (1.88 m)     Head Circumference --      Peak Flow --      Pain Score 10/23/24 1315 0     Pain Loc --      Pain Education --      Exclude from Growth Chart --     Most recent vital signs: Vitals:   10/23/24 1315 10/23/24 1716  BP: (!) 127/90 (!) 164/84  Pulse: 62 (!) 55  Resp: 16 20  Temp: 98.1 F (36.7 C) 98.1 F (36.7 C)  SpO2: 95% 95%   General: Awake, in no acute distress.  Head: Normocephalic, atraumatic. CV: Good peripheral perfusion. Radial pulses 2+ b/l. Cap refill <2 sec. Respiratory:Normal respiratory effort.  No respiratory distress.  GI: Soft, non-distended. MSK: Normal ROM and 5/5 strength in all b/l finger  joints. Skin:Warm, dry, 3 cm jagged laceration to anterior aspect of left ring finger b/t DIP and PIP, bleeding. Abrasion to left pinky finger, anterior side. Neurological: A&Ox4 to person, place, time, and situation. Sensation intact and equal to b/l fingers. Strength symmetric.    ED Results / Procedures / Treatments   Labs (all labs ordered are listed, but only abnormal results are displayed) Labs Reviewed - No data to display   EKG     RADIOLOGY    PROCEDURES:  Critical Care performed: No   .Laceration Repair  Date/Time: 10/23/2024 5:18 PM  Performed by: Sheron Salm, PA-C Authorized by: Sheron Salm, PA-C   Consent:    Consent obtained:  Verbal   Consent given by:  Patient   Risks discussed:  Infection, pain, nerve damage, poor wound healing, vascular damage, tendon damage and poor cosmetic result Universal protocol:    Procedure explained and questions answered to patient or proxy's satisfaction: yes     Immediately prior to procedure, a time out was called: yes     Patient identity confirmed:  Verbally with patient Anesthesia:    Anesthesia  method:  Topical application   Topical anesthetic:  LET Laceration details:    Location:  Finger   Finger location:  L ring finger   Length (cm):  3 Exploration:    Hemostasis achieved with:  LET and direct pressure   Wound extent: no foreign body, no signs of injury, no nerve damage, no tendon damage, no underlying fracture and no vascular damage     Contaminated: no   Treatment:    Area cleansed with:  Povidone-iodine    Amount of cleaning:  Standard   Irrigation solution:  Sterile saline   Irrigation volume:  50 mL   Irrigation method:  Syringe Skin repair:    Repair method:  Sutures   Suture size:  5-0   Suture material:  Nylon   Suture technique:  Simple interrupted   Number of sutures:  4 Approximation:    Approximation:  Close Repair type:    Repair type:  Simple Post-procedure details:     Dressing:  Sterile dressing   Procedure completion:  Tolerated well, no immediate complications    MEDICATIONS ORDERED IN ED: Medications  lidocaine -EPINEPHrine -tetracaine  (LET) topical gel (3 mLs Topical Given 10/23/24 1621)  Tdap (ADACEL ) injection 0.5 mL (0.5 mLs Intramuscular Given 10/23/24 1608)     IMPRESSION / MDM / ASSESSMENT AND PLAN / ED COURSE  I reviewed the triage vital signs and the nursing notes.                              Differential diagnosis includes, but is not limited to, left ring finger laceration, abrasion, nerve or tendon injury  Patient's presentation is most consistent with acute complicated illness / injury requiring diagnostic workup.  Patient is an 83 year old male presenting with left ring finger laceration and left pinky finger abrasion after an injury today.  He is neurovascularly intact and able to move all finger joints.  Laceration was bleeding a significant amount, so did apply LET was successful in achieving hemostasis.  Wound care and Band-Aid applied to left pinky.  Please see procedure note for full details regarding repair of left ring finger laceration with 4 simple interrupted nylon sutures.  Sterile dressing applied.  Wound care instructions were discussed with the patient.  Discussed return precautions regarding signs or symptoms of infection.  Will have him follow-up with his PCP, an urgent care or in the emergency department in 7 to 10 days for suture removal.  The patient may return to the emergency department for any new, worsening, or concerning symptoms. Patient was given the opportunity to ask questions; all questions were answered. Emergency department return precautions were discussed with the patient.  Patient is in agreement to the treatment plan.  Patient is stable for discharge.    FINAL CLINICAL IMPRESSION(S) / ED DIAGNOSES   Final diagnoses:  Laceration of left ring finger w/o foreign body w/o damage to nail, initial  encounter     Rx / DC Orders   ED Discharge Orders     None        Note:  This document was prepared using Dragon voice recognition software and may include unintentional dictation errors.     Sheron Salm, PA-C 10/23/24 1722    Jacolyn Pae, MD 10/23/24 1749  "

## 2024-10-30 NOTE — Progress Notes (Deleted)
 BH MD/PA/NP OP Progress Note  10/30/2024 2:53 PM John [name]  MRN:  [id number]  Chief Complaint: No chief complaint on file.  HPI: *** Visit Diagnosis: No diagnosis found.  Past Psychiatric History: Please see initial evaluation for full details. I have reviewed the history. No updates at this time.     Past Medical History:  Past Medical History:  Diagnosis Date   Cancer (HCC)    Depression    Depression    Diabetes mellitus without complication (HCC)    type 2   Dissection of carotid artery    a. 06/2015 CT Head: abnl thickening of mid-dist R cervical ICA w/o narrowing - ? vasculitis and thrombosed/healing dissection; b. 01/2021 Carotid U/S: 1-39% bilat ICA stenoses.   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 Age 48 - struck in head w/ baseball --> s/p craniotomy.   Epidural hematoma (HCC)    H/O Osgood-Schlatter disease    Hand deformity, congenital    Hearing loss bilateral   Hemorrhoids    Horner syndrome    Hypertension    Migraine    Obesity    PONV (postoperative nausea and vomiting)    Prostate cancer (HCC) 2020   Rosacea    SDH (subdural hematoma) (HCC)    Sleep apnea    Spontaneous Subdural hematoma (HCC)    a. Approx 2015 - eval in [place] - conservatively managed.    Past Surgical History:  Procedure Laterality Date   ANKLE ARTHROSCOPY Left    fracture   CHOLECYSTECTOMY  2007   CRANIOTOMY  1958   left frontal craniotomy for epidural hematoma   HIP ARTHROPLASTY Right 04/07/2022   Procedure: ARTHROPLASTY BIPOLAR HIP (HEMIARTHROPLASTY);  Surgeon: Cleotilde Barrio, MD;  Location: [facility] ORS;  Service: Orthopedics;  Laterality: Right;   INSERTION OF MESH  01/02/2022   Procedure: INSERTION OF MESH;  Surgeon: Rodolph Romano, MD;  Location: [facility] ORS;  Service: General;;   IRRIGATION AND DEBRIDEMENT FOOT Left 06/27/2023   Procedure: IRRIGATION AND DEBRIDEMENT FOOT;  Surgeon: Ashley Soulier, DPM;  Location: [facility] ORS;  Service:  Orthopedics/Podiatry;  Laterality: Left;   LIGAMENT REPAIR Right    thumb   METATARSAL HEAD EXCISION Left 06/27/2023   Procedure: EXCISION OF BONE IN GREAT TOE JOINT;  Surgeon: Ashley Soulier, DPM;  Location: [facility] ORS;  Service: Orthopedics/Podiatry;  Laterality: Left;   RETINAL DETACHMENT SURGERY     TREATMENT FISTULA ANAL  2006    Family Psychiatric History: Please see initial evaluation for full details. I have reviewed the history. No updates at this time.     Family History:  Family History  Problem Relation Age of Onset   Osteoporosis Mother    Depression Mother    Prostate cancer Father    Hypertension Father    Diabetes Brother    Kidney disease Neg Hx     Social History:  Social History   Socioeconomic History   Marital status: Married    Spouse name: Niels   Number of children: 2   Years of education: Not on file   Highest education level: Master's degree (e.g., MA, MS, MEng, MEd, MSW, MBA)  Occupational History   Not on file  Tobacco Use   Smoking status: Never   Smokeless tobacco: Never  Vaping Use   Vaping status: Never Used  Substance and Sexual Activity   Alcohol use: Not Currently   Drug use: No   Sexual activity: Yes  Birth control/protection: None  Other Topics Concern   Not on file  Social History Narrative   Live at home with wife.    Social Drivers of Health   Tobacco Use: Low Risk (10/23/2024)   Patient History    Smoking Tobacco Use: Never    Smokeless Tobacco Use: Never    Passive Exposure: Not on file  Financial Resource Strain: Low Risk  (12/11/2023)   Received from [facility]   Overall Financial Resource Strain (CARDIA)    Difficulty of Paying Living Expenses: Not very hard  Food Insecurity: No Food Insecurity (12/11/2023)   Received from [facility]   Epic    Within the past 12 months, you worried that your food would run out before you got the money to buy more.: Never true    Within the  past 12 months, the food you bought just didn't last and you didn't have money to get more.: Never true  Transportation Needs: No Transportation Needs (12/11/2023)   Received from [facility] - Transportation    In the past 12 months, has lack of transportation kept you from medical appointments or from getting medications?: No    Lack of Transportation (Non-Medical): No  Physical Activity: Not on file  Stress: Not on file  Social Connections: Not on file  Depression (PHQ2-9): Low Risk (08/26/2024)   Depression (PHQ2-9)    PHQ-2 Score: 0  Alcohol Screen: Low Risk (07/04/2022)   Alcohol Screen    Last Alcohol Screening Score (AUDIT): 1  Housing: Low Risk  (07/14/2024)   Received from [facility]   Epic    In the last 12 months, was there a time when you were not able to pay the mortgage or rent on time?: No    In the past 12 months, how many times have you moved where you were living?: 0    At any time in the past 12 months, were you homeless or living in a shelter (including now)?: No  Utilities: Not At Risk (12/11/2023)   Received from [facility] Utilities    Threatened with loss of utilities: No  Health Literacy: Not on file    Allergies: Allergies[1]  Metabolic Disorder Labs: Lab Results  Component Value Date   HGBA1C 5.6 03/19/2022   MPG 114.02 03/19/2022   No results found for: PROLACTIN Lab Results  Component Value Date   CHOL 131 03/19/2022   TRIG 92 03/19/2022   HDL 33 (L) 03/19/2022   CHOLHDL 4.0 03/19/2022   VLDL 18 03/19/2022   LDLCALC 80 03/19/2022   Lab Results  Component Value Date   TSH 0.766 04/01/2022    Therapeutic Level Labs: No results found for: LITHIUM No results found for: VALPROATE No results found for: CBMZ  Current Medications: Current Outpatient Medications  Medication Sig Dispense Refill   acetaminophen  (TYLENOL ) 325 MG tablet Take 2 tablets (650 mg total)  by mouth every 6 (six) hours as needed for mild pain (or Fever >/= 101).     aspirin  EC 81 MG tablet Take 81 mg by mouth daily.     buPROPion  (WELLBUTRIN  XL) 300 MG 24 hr tablet Take 1 tablet (300 mg total) by mouth daily. 90 tablet 1   docusate sodium  (COLACE) 100 MG capsule Take 1 capsule (100 mg total) by mouth 2 (two) times daily. 60 capsule 3   OLANZapine  (ZYPREXA ) 2.5 MG tablet Take 1  tablet (2.5 mg total) by mouth every other day. 45 tablet 0   penicillin G potassium (PFIZERPEN) 20000000 units injection INJECT 0.3 ML INTO THE MUSCLE ONCE FOR 1 DOSE (Patient not taking: Reported on 08/16/2024)     sertraline  (ZOLOFT ) 100 MG tablet Take 1 tablet (100 mg total) by mouth at bedtime. Total of 150 mg at night. Take along with 50 mg tab 30 tablet 5   sertraline  (ZOLOFT ) 50 MG tablet Take 1 tablet (50 mg total) by mouth at bedtime. Total of 150 mg at night. Take along with 100 mg tab 90 tablet 1   tamsulosin  (FLOMAX ) 0.4 MG CAPS capsule Take 1 capsule (0.4 mg total) by mouth in the morning and at bedtime. 60 capsule 11   No current facility-administered medications for this visit.     Musculoskeletal: Strength & Muscle Tone: within normal limits Gait & Station: normal Patient leans: N/A  Psychiatric Specialty Exam: Review of Systems  There were no vitals taken for this visit.There is no height or weight on file to calculate BMI.  General Appearance: {Appearance:22683}  Eye Contact:  {BHH EYE CONTACT:22684}  Speech:  Clear and Coherent  Volume:  Normal  Mood:  {BHH MOOD:22306}  Affect:  {Affect (PAA):22687}  Thought Process:  Coherent  Orientation:  Full (Time, Place, and Person)  Thought Content: Logical   Suicidal Thoughts:  {ST/HT (PAA):22692}  Homicidal Thoughts:  {ST/HT (PAA):22692}  Memory:  Immediate;   Good  Judgement:  {Judgement (PAA):22694}  Insight:  {Insight (PAA):22695}  Psychomotor Activity:  Normal  Concentration:  Concentration: Good and Attention Span: Good   Recall:  Good  Fund of Knowledge: Good  Language: Good  Akathisia:  No  Handed:  Right  AIMS (if indicated): not done  Assets:  Communication Skills Desire for Improvement  ADL's:  Intact  Cognition: WNL  Sleep:  {BHH GOOD/FAIR/POOR:22877}   Screenings: AIMS    Flowsheet Row Admission (Discharged) from 04/11/2022 in [facility] BEHAVIORAL MEDICINE  AIMS Total Score 6   AUDIT    Flowsheet Row Admission (Discharged) from 07/04/2022 in [facility] BEHAVIORAL MEDICINE Admission (Discharged) from 04/04/2022 in [facility] BEH[facility]IORAL MEDICINE Admission (Discharged) from 03/13/2022 in [facility] BEHAV[facility]AL MEDICINE  Alcohol Use Disorder Identification Test Final Score (AUDIT) 1 0 0   ECT-MADRS    Flowsheet Row ECT Treatment from 09/06/2022 in [facility] Admission (Discharged) from 03/13/2022 in [facility] BEHAVIORAL MEDICINE  MADRS Total Score 13 42   GAD-7    Flowsheet Row Office Visit from 08/26/2024 in [facility] Office Visit from 11/06/2023 in [facility] Office Visit from 05/20/2023 in [facility] Office Visit from 03/11/2023 in [facility] Office Visit from 12/02/2022 in [facility]  Total GAD-7 Score 3 5 3 9 12    Mini-Mental    Flowsheet Row ECT Treatment from 08/16/2022 in [facility] Admission (Discharged) from 03/13/2022 in [facility] BE[facility]AV[facility]AL MEDICINE  Total Score (max 30 points ) 30 22   PHQ2-9    Flowsheet Row Office Visit from 08/26/2024 in [facility] Office Visit from 11/06/2023 in [facility] Office Visit from 09/09/2023 in [facility] Office Visit from 07/15/2023 in  [facility] Office Visit from 05/20/2023 in [facility]  PHQ-2  Total Score 0 2 2 4 2   PHQ-9 Total Score -- 6 7 8 8    Flowsheet Row ED from 10/23/2024 in [facility] Department at [facility] Visit from 07/15/2023 in [facility] ED to Hosp-Admission (Discharged) from 06/25/2023 in [facility] (1A)  C-SSRS RISK CATEGORY No Risk Error: Question 6 not populated No Risk     Assessment and Plan:  John [name] is a 84 y.o. male with a history of depression, diabetes, hypertension, PAfib, sleep apnea, subdural hematoma, epidural hematoma in 1958,  seizure disorder (no episode for the past five years, not on anticonvulsant), hip fracture,  right femur after mechanical fall s/p right hip arthroplasty on 6/11, who presents for follow up appointment for below.    1. MDD (major depressive disorder), recurrent, in partial remission 2. Anxiety disorder, unspecified type Acute stressors include: concern about the rental property, financial strain, hip fracture, newly found Afib,recent admission due to osteomyelitis  Other stressors include: head injury as a teenager s/p removal of the hematoma   History:good response to ECT 2023 (concern about memory loss), admitted to [facility] in May 2023 for depression, SI, and Sept 2023 for depression (initially confused and non verbal).   Although the exam is notable for slight rumination about his condition/anxiety, there has been consistent improvement in spontaneous speech. It is noteworthy that he has taken initiative to research his hearing aid, reflecting a marked improvement from previous state.  Will continue current medication regimen.  Will continue current dose of sertraline  to target depression, anxiety and bupropion  as adjunctive treatment for depression.  Noted that he has been on the current  dose since the last admission despite he has history of seizure.  Will continue the current dose of olanzapine  with a plan to slowly taper it off to reduce the long-term side effect.    3. Cognitive impairment Functional Status   IADL: Independent in the following:            Requires assistance with the following: managing finances, medications, driving ADL  Independent in the following: bathing and hygiene, feeding, continence, grooming and toileting, walking (walker)          Requires assistance with the following: Folate, Vitamin B12 255 01/2023,  (TSH 11/2022 wnl) Images: Head MRI 07/2022- 1. No acute intracranial abnormality. 2. Progressed chronic small vessel disease since a 2016 MRI, including fairly numerous chronic micro hemorrhages in both thalami. 3. Prior left frontal craniotomy/cranioplasty with subtle chronic encephalomalacia of underlying frontal gyri. 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   Will continue to monitor and intervene as indicated.  While he may benefit from Aricept, will not start this medication due to possible adverse reaction of bradycardia.  Psychoeducation was provided at length regarding cognitive exercise, and the possible reason of this improvement.  He agrees to continue working on animator, social activities, and a healthy diet.    # Insomnia Slight worsening in early morning awakening.  He denies any need for psychotropics.  He used to be on trazodone  in the past with good benefit; may consider restarting this medication if any worsening.  Provided psychoeducation to improve sleep  hygiene.    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 (tapered down from daily 06/2024, from 5 mg daily 01/2024, down from 10 mg on 08/2023)  Next appointment:  1/6 at 3 pm, IP - may consider referral for medical review program at [facility] - he sees a therapist, Rico Kato, in [place] every other way   The patient demonstrates the following risk factors for suicide: Chronic risk factors for suicide include: psychiatric disorder of depression and previous suicide attempts of putting a bag over his head many years ago, but then he took it off. . Acute risk factors for suicide include: unemployment and loss (financial, interpersonal, professional). Protective factors for this patient include: positive social support and hope for the future. Considering these factors, the overall suicide risk at this point appears to be low. Patient is appropriate for outpatient follow up.     Collaboration of Care: Collaboration of Care: {BH OP Collaboration of Care:21014065}  Patient/Guardian was advised Release of Information must be obtained prior to any record release in order to collaborate their care with an outside provider. Patient/Guardian was advised if they have not already done so to contact the registration department to sign all necessary forms in order for us  to release information regarding their care.   Consent: Patient/Guardian gives verbal consent for treatment and assignment of benefits for services provided during this visit. Patient/Guardian expressed understanding and agreed to proceed.    Katheren Sleet, MD 10/30/2024, 2:53 PM     [1] No Known Allergies

## 2024-11-02 ENCOUNTER — Ambulatory Visit: Admitting: Psychiatry

## 2024-11-08 NOTE — Discharge Instructions (Signed)

## 2024-11-09 ENCOUNTER — Other Ambulatory Visit: Payer: Self-pay

## 2024-11-09 ENCOUNTER — Encounter: Payer: Self-pay | Admitting: Ophthalmology

## 2024-11-10 ENCOUNTER — Encounter: Payer: Self-pay | Admitting: Ophthalmology

## 2024-11-10 ENCOUNTER — Ambulatory Visit: Payer: Self-pay

## 2024-11-10 ENCOUNTER — Other Ambulatory Visit: Payer: Self-pay

## 2024-11-10 ENCOUNTER — Encounter: Admission: RE | Disposition: A | Payer: Self-pay | Source: Home / Self Care | Attending: Ophthalmology

## 2024-11-10 ENCOUNTER — Ambulatory Visit
Admission: RE | Admit: 2024-11-10 | Discharge: 2024-11-10 | Disposition: A | Discharge status: Home / Self Care | Attending: Ophthalmology | Admitting: Ophthalmology

## 2024-11-10 DIAGNOSIS — H2512 Age-related nuclear cataract, left eye: Secondary | ICD-10-CM | POA: Diagnosis present

## 2024-11-10 DIAGNOSIS — E669 Obesity, unspecified: Secondary | ICD-10-CM | POA: Diagnosis not present

## 2024-11-10 DIAGNOSIS — Z79899 Other long term (current) drug therapy: Secondary | ICD-10-CM | POA: Diagnosis not present

## 2024-11-10 DIAGNOSIS — Z6826 Body mass index (BMI) 26.0-26.9, adult: Secondary | ICD-10-CM | POA: Insufficient documentation

## 2024-11-10 DIAGNOSIS — I10 Essential (primary) hypertension: Secondary | ICD-10-CM | POA: Insufficient documentation

## 2024-11-10 DIAGNOSIS — E1136 Type 2 diabetes mellitus with diabetic cataract: Secondary | ICD-10-CM | POA: Diagnosis not present

## 2024-11-10 DIAGNOSIS — Z7982 Long term (current) use of aspirin: Secondary | ICD-10-CM | POA: Insufficient documentation

## 2024-11-10 DIAGNOSIS — Z833 Family history of diabetes mellitus: Secondary | ICD-10-CM | POA: Insufficient documentation

## 2024-11-10 DIAGNOSIS — E1169 Type 2 diabetes mellitus with other specified complication: Secondary | ICD-10-CM

## 2024-11-10 DIAGNOSIS — I480 Paroxysmal atrial fibrillation: Secondary | ICD-10-CM

## 2024-11-10 HISTORY — PX: CATARACT EXTRACTION W/PHACO: SHX586

## 2024-11-10 MED ORDER — CYCLOPENTOLATE HCL 2 % OP SOLN
OPHTHALMIC | Status: AC
  Filled 2024-11-10: qty 2

## 2024-11-10 MED ORDER — SIGHTPATH DOSE#1 BSS IO SOLN
INTRAOCULAR | Status: DC | PRN
  Administered 2024-11-10: 71 mL via OPHTHALMIC

## 2024-11-10 MED ORDER — CYCLOPENTOLATE HCL 2 % OP SOLN
1.0000 [drp] | OPHTHALMIC | Status: AC | PRN
  Administered 2024-11-10 (×3): 1 [drp] via OPHTHALMIC

## 2024-11-10 MED ORDER — LIDOCAINE HCL (PF) 2 % IJ SOLN
INTRAOCULAR | Status: DC | PRN
  Administered 2024-11-10: 2 mL

## 2024-11-10 MED ORDER — SIGHTPATH DOSE#1 BSS IO SOLN
INTRAOCULAR | Status: DC | PRN
  Administered 2024-11-10: 15 mL via INTRAOCULAR

## 2024-11-10 MED ORDER — BRIMONIDINE TARTRATE-TIMOLOL 0.2-0.5 % OP SOLN
OPHTHALMIC | Status: DC | PRN
  Administered 2024-11-10: 1 [drp] via OPHTHALMIC

## 2024-11-10 MED ORDER — SIGHTPATH DOSE#1 NA HYALUR & NA CHOND-NA HYALUR IO KIT
PACK | INTRAOCULAR | Status: DC | PRN
  Administered 2024-11-10: 1 via OPHTHALMIC

## 2024-11-10 MED ORDER — MIDAZOLAM HCL (PF) 2 MG/2ML IJ SOLN
INTRAMUSCULAR | Status: DC | PRN
  Administered 2024-11-10: 2 mg via INTRAVENOUS

## 2024-11-10 MED ORDER — GLYCOPYRROLATE 0.2 MG/ML IJ SOLN
INTRAMUSCULAR | Status: AC
  Filled 2024-11-10: qty 1

## 2024-11-10 MED ORDER — PHENYLEPHRINE HCL 10 % OP SOLN
OPHTHALMIC | Status: AC
  Filled 2024-11-10: qty 5

## 2024-11-10 MED ORDER — PHENYLEPHRINE HCL 10 % OP SOLN
1.0000 [drp] | OPHTHALMIC | Status: AC | PRN
  Administered 2024-11-10 (×3): 1 [drp] via OPHTHALMIC

## 2024-11-10 MED ORDER — TETRACAINE HCL 0.5 % OP SOLN
1.0000 [drp] | OPHTHALMIC | Status: DC | PRN
  Administered 2024-11-10 (×3): 1 [drp] via OPHTHALMIC

## 2024-11-10 MED ORDER — FENTANYL CITRATE (PF) 100 MCG/2ML IJ SOLN
INTRAMUSCULAR | Status: DC | PRN
  Administered 2024-11-10: 50 ug via INTRAVENOUS

## 2024-11-10 MED ORDER — FENTANYL CITRATE (PF) 100 MCG/2ML IJ SOLN
INTRAMUSCULAR | Status: AC
  Filled 2024-11-10: qty 2

## 2024-11-10 MED ORDER — TETRACAINE HCL 0.5 % OP SOLN
OPHTHALMIC | Status: AC
  Filled 2024-11-10: qty 4

## 2024-11-10 MED ORDER — CEFUROXIME OPHTHALMIC INJECTION 1 MG/0.1 ML
INJECTION | OPHTHALMIC | Status: DC | PRN
  Administered 2024-11-10: 1 mg via INTRACAMERAL

## 2024-11-10 MED ORDER — MIDAZOLAM HCL 2 MG/2ML IJ SOLN
INTRAMUSCULAR | Status: AC
  Filled 2024-11-10: qty 2

## 2024-11-10 NOTE — Progress Notes (Signed)
 Referral entered

## 2024-11-10 NOTE — Progress Notes (Signed)
 Referral entered for PT per Dr. Glover

## 2024-11-10 NOTE — Transfer of Care (Signed)
 Immediate Anesthesia Transfer of Care Note  Patient: John [name]  Procedure(s) Performed: PHACOEMULSIFICATION, CATARACT, WITH IOL INSERTION 9.13 00:50.5 (Left: Eye)  Patient Location: PACU  Anesthesia Type: MAC  Level of Consciousness: awake, alert  and patient cooperative  Airway and Oxygen Therapy: Patient Spontanous Breathing and Patient connected to supplemental oxygen  Post-op Assessment: Post-op Vital signs reviewed, Patient's Cardiovascular Status Stable, Respiratory Function Stable, Patent Airway and No signs of Nausea or vomiting  Post-op Vital Signs: Reviewed and stable  Complications: No notable events documented.

## 2024-11-10 NOTE — Addendum Note (Signed)
 Addendum  created 11/10/24 0828 by Levy Harvey, CRNA   Flowsheet accepted

## 2024-11-10 NOTE — Op Note (Signed)
 OPERATIVE NOTE  John [name] [id number] 11/10/2024   PREOPERATIVE DIAGNOSIS:  Nuclear sclerotic cataract left eye. H25.12   POSTOPERATIVE DIAGNOSIS:    Nuclear sclerotic cataract left eye.     PROCEDURE:  Phacoemusification with posterior chamber intraocular lens placement of the left eye  Ultrasound time: Procedures: PHACOEMULSIFICATION, CATARACT, WITH IOL INSERTION 9.13 00:50.5 (Left)  LENS:   Implant Name Type Inv. Item Serial No. Manufacturer Lot No. LRB No. Used Action  LENS IOL TECNIS EYHANCE 19.5 - [id number] Intraocular Lens LENS IOL TECNIS EYHANCE 19.5 [id number] [employer]  Left 1 Implanted      SURGEON:  Dene FABIENE Etienne, MD   ANESTHESIA:  Topical with tetracaine  drops and 2% Xylocaine  jelly, augmented with 1% preservative-free intracameral lidocaine .    COMPLICATIONS:  None.   DESCRIPTION OF PROCEDURE:  The patient was identified in the holding room and transported to the operating room and placed in the supine position under the operating microscope.  The left eye was identified as the operative eye and it was prepped and draped in the usual sterile ophthalmic fashion.   A 1 millimeter clear-corneal paracentesis was made at the 1:30 position.  0.5 ml of preservative-free 1% lidocaine  was injected into the anterior chamber.  The anterior chamber was filled with Viscoat viscoelastic.  A 2.4 millimeter keratome was used to make a near-clear corneal incision at the 10:30 position.  .  A curvilinear capsulorrhexis was made with a cystotome and capsulorrhexis forceps.  Balanced salt solution was used to hydrodissect and hydrodelineate the nucleus.   Phacoemulsification was then used in stop and chop fashion to remove the lens nucleus and epinucleus.  The remaining cortex was then removed using the irrigation and aspiration handpiece. Provisc was then placed into the capsular bag to distend it for lens placement.  A lens was then injected into the capsular bag.  The  remaining viscoelastic was aspirated.   Wounds were hydrated with balanced salt solution.  The anterior chamber was inflated to a physiologic pressure with balanced salt solution.  No wound leaks were noted. Cefuroxime  0.1 ml of a 10mg /ml solution was injected into the anterior chamber for a dose of 1 mg of intracameral antibiotic at the completion of the case.   Timolol  and Brimonidine  drops were applied to the eye.  The patient was taken to the recovery room in stable condition without complications of anesthesia or surgery.  John [name] 11/10/2024, 7:52 AM

## 2024-11-10 NOTE — H&P (Signed)
 " [facility]   Primary Care Physician:  Glover Lenis, MD Ophthalmologist: Dr. Dene Etienne  Pre-Procedure History & Physical: HPI:  John [name] is a 84 y.o. male here for ophthalmic surgery.   Past Medical History:  Diagnosis Date   Cancer (HCC)    Depression    Depression    Diabetes mellitus without complication (HCC)    type 2   Dissection of carotid artery    a. 06/2015 CT Head: abnl thickening of mid-dist R cervical ICA w/o narrowing - ? vasculitis and thrombosed/healing dissection; b. 01/2021 Carotid U/S: 1-39% bilat ICA stenoses.   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 Age 44 - struck in head w/ baseball --> s/p craniotomy.   Epidural hematoma (HCC)    H/O Osgood-Schlatter disease    Hand deformity, congenital    Hearing loss bilateral   Hemorrhoids    Horner syndrome    Hypertension    Migraine    Obesity    PONV (postoperative nausea and vomiting)    Prostate cancer (HCC) 2020   Rosacea    SDH (subdural hematoma) (HCC)    Sleep apnea    Spontaneous Subdural hematoma (HCC)    a. Approx 2015 - eval in [place] - conservatively managed.    Past Surgical History:  Procedure Laterality Date   ANKLE ARTHROSCOPY Left    fracture   CHOLECYSTECTOMY  2007   CRANIOTOMY  1958   left frontal craniotomy for epidural hematoma   HIP ARTHROPLASTY Right 04/07/2022   Procedure: ARTHROPLASTY BIPOLAR HIP (HEMIARTHROPLASTY);  Surgeon: Cleotilde Barrio, MD;  Location: [facility] ORS;  Service: Orthopedics;  Laterality: Right;   INSERTION OF MESH  01/02/2022   Procedure: INSERTION OF MESH;  Surgeon: Rodolph Romano, MD;  Location: [facility] ORS;  Service: General;;   IRRIGATION AND DEBRIDEMENT FOOT Left 06/27/2023   Procedure: IRRIGATION AND DEBRIDEMENT FOOT;  Surgeon: Ashley Soulier, DPM;  Location: [facility] OR[facility];  Service: Orthopedics/Podiatry;  Laterality: Left;   LIGAMENT REPAIR Right    thumb   METATARSAL HEAD EXCISION Left 06/27/2023   Procedure:  EXCISION OF BONE IN GREAT TOE JOINT;  Surgeon: Ashley Soulier, DPM;  Location: [facility] ORS;  Service: Orthopedics/Podiatry;  Laterality: Left;   RETINAL DETACHMENT SURGERY     TREATMENT FISTULA ANAL  2006    Prior to Admission medications  Medication Sig Start Date End Date Taking? Authorizing Provider  acetaminophen  (TYLENOL ) 325 MG tablet Take 2 tablets (650 mg total) by mouth every 6 (six) hours as needed for mild pain (or Fever >/= 101). 06/28/23  Yes Josette Ade, MD  aspirin  EC 81 MG tablet Take 81 mg by mouth daily.   Yes [provider]  buPROPion  (WELLBUTRIN  XL) 300 MG 24 hr tablet Take 1 tablet (300 mg total) by mouth daily. 07/28/24 01/24/25 Yes Hisada, Katheren, MD  docusate sodium  (COLACE) 100 MG capsule Take 1 capsule (100 mg total) by mouth 2 (two) times daily. 07/31/22  Yes Cam Ade Loving, DO  OLANZapine  (ZYPREXA ) 2.5 MG tablet Take 1 tablet (2.5 mg total) by mouth every other day. 10/12/24 01/10/25 Yes Vickey Katheren, MD  sertraline  (ZOLOFT ) 100 MG tablet Take 1 tablet (100 mg total) by mouth at bedtime. Total of 150 mg at night. Take along with 50 mg tab 05/19/24 11/15/24 Yes Hisada, Katheren, MD  sertraline  (ZOLOFT ) 50 MG tablet Take 1 tablet (50 mg total) by mouth at bedtime. Total of 150 mg at night.  Take along with 100 mg tab 09/20/24 03/19/25 Yes Hisada, Katheren, MD  tamsulosin  (FLOMAX ) 0.4 MG CAPS capsule Take 1 capsule (0.4 mg total) by mouth in the morning and at bedtime. 03/21/23  Yes Vaillancourt, Samantha, PA-C  penicillin G potassium (PFIZERPEN) 20000000 units injection INJECT 0.3 ML INTO THE MUSCLE ONCE FOR 1 DOSE Patient not taking: Reported on 08/16/2024 07/07/24   [provider]    Allergies as of 10/19/2024   (No Known Allergies)    Family History  Problem Relation Age of Onset   Osteoporosis Mother    Depression Mother    Prostate cancer Father    Hypertension Father    Diabetes Brother    Kidney disease Neg Hx     Social History    Socioeconomic History   Marital status: Married    Spouse name: Niels   Number of children: 2   Years of education: Not on file   Highest education level: Master's degree (e.g., MA, MS, MEng, MEd, MSW, MBA)  Occupational History   Not on file  Tobacco Use   Smoking status: Never   Smokeless tobacco: Never  Vaping Use   Vaping status: Never Used  Substance and Sexual Activity   Alcohol use: Not Currently   Drug use: No   Sexual activity: Yes    Birth control/protection: None  Other Topics Concern   Not on file  Social History Narrative   Live at home with wife.    Social Drivers of Health   Tobacco Use: Low Risk (11/10/2024)   Patient History    Smoking Tobacco Use: Never    Smokeless Tobacco Use: Never    Passive Exposure: Not on file  Financial Resource Strain: Low Risk  (12/11/2023)   Received from [facility]   Overall Financial Resource Strain (CARDIA)    Difficulty of Paying Living Expenses: Not very hard  Food Insecurity: No Food Insecurity (12/11/2023)   Received from [facility]   Epic    Within the past 12 months, you worried that your food would run out before you got the money to buy more.: Never true    Within the past 12 months, the food you bought just didn't last and you didn't have money to get more.: Never true  Transportation Needs: No Transportation Needs (12/11/2023)   Received from [facility]    In the past 12 months, has lack of transportation kept you from medical appointments or from getting medications?: No    Lack of Transportation (Non-Medical): No  Physical Activity: Not on file  Stress: Not on file  Social Connections: Not on file  Intimate Partner Violence: Not At Risk (06/25/2023)   Humiliation, Afraid, Rape, and Kick questionnaire    Fear of Current or Ex-Partner: No    Emotionally Abused: No    Physically Abused: No    Sexually Abused: No  Depression  (PHQ2-9): Low Risk (08/26/2024)   Depression (PHQ2-9)    PHQ-2 Score: 0  Alcohol Screen: Low Risk (07/04/2022)   Alcohol Screen    Last Alcohol Screening Score (AUDIT): 1  Housing: Low Risk  (07/14/2024)   Received from [facility]   Epic    In the last 12 months, was there a time when you were not able to pay the mortgage or rent on time?: No    In the past 12 months, how many times have you moved where you were  living?: 0    At any time in the past 12 months, were you homeless or living in a shelter (including now)?: No  Utilities: Not At Risk (12/11/2023)   Received from [facility] Utilities    Threatened with loss of utilities: No  Health Literacy: Not on file    Review of Systems: See HPI, otherwise negative ROS  Physical Exam: BP (!) 164/93   Pulse (!) 59   Temp 98.4 F (36.9 C) (Temporal)   Ht 6' 2 (1.88 m)   Wt 94.8 kg   SpO2 100%   BMI 26.83 kg/m  General:   Alert,  pleasant and cooperative in NAD Head:  Normocephalic and atraumatic. Lungs:  Clear to auscultation.    Heart:  Regular rate and rhythm.   Impression/Plan: John [name] is here for ophthalmic surgery.  Risks, benefits, limitations, and alternatives regarding ophthalmic surgery have been reviewed with the patient.  Questions have been answered.  All parties agreeable.   MITTIE GASKIN, MD  11/10/2024, 7:29 AM  "

## 2024-11-10 NOTE — Anesthesia Preprocedure Evaluation (Signed)
"                                    Anesthesia Evaluation  Patient identified by MRN, date of birth, ID band Patient awake    Reviewed: Allergy & Precautions, H&P , NPO status , Patient's Chart, lab work & pertinent test results  Airway Mallampati: II  TM Distance: >3 FB Neck ROM: Full    Dental no notable dental hx. (+) Upper Dentures, Edentulous Lower   Pulmonary neg pulmonary ROS   Pulmonary exam normal breath sounds clear to auscultation       Cardiovascular hypertension, negative cardio ROS Normal cardiovascular exam Rhythm:Regular Rate:Normal     Neuro/Psych negative neurological ROS  negative psych ROS   GI/Hepatic negative GI ROS, Neg liver ROS,,,  Endo/Other  negative endocrine ROSdiabetes    Renal/GU negative Renal ROS  negative genitourinary   Musculoskeletal negative musculoskeletal ROS (+)    Abdominal   Peds negative pediatric ROS (+)  Hematology negative hematology ROS (+)   Anesthesia Other Findings   Reproductive/Obstetrics negative OB ROS                              Anesthesia Physical Anesthesia Plan  ASA: 2  Anesthesia Plan: MAC   Post-op Pain Management:    Induction: Intravenous  PONV Risk Score and Plan:   Airway Management Planned:   Additional Equipment:   Intra-op Plan:   Post-operative Plan: Extubation in OR  Informed Consent: I have reviewed the patients History and Physical, chart, labs and discussed the procedure including the risks, benefits and alternatives for the proposed anesthesia with the patient or authorized representative who has indicated his/her understanding and acceptance.     Dental advisory given  Plan Discussed with: CRNA  Anesthesia Plan Comments:         Anesthesia Quick Evaluation  "

## 2024-11-10 NOTE — Anesthesia Postprocedure Evaluation (Signed)
"   Anesthesia Post Note  Patient: John [name]  Procedure(s) Performed: PHACOEMULSIFICATION, CATARACT, WITH IOL INSERTION 9.13 00:50.5 (Left: Eye)  Patient location during evaluation: PACU Anesthesia Type: MAC Level of consciousness: awake and alert Pain management: pain level controlled Vital Signs Assessment: post-procedure vital signs reviewed and stable Respiratory status: spontaneous breathing, nonlabored ventilation, respiratory function stable and patient connected to nasal cannula oxygen Cardiovascular status: blood pressure returned to baseline and stable Postop Assessment: no apparent nausea or vomiting Anesthetic complications: no   No notable events documented.   Last Vitals:  Vitals:   11/10/24 0754 11/10/24 0758  BP: (!) 142/74 (!) 132/93  Pulse: (!) 58 62  Resp: 16 15  Temp: (!) 36.3 C (!) 36.3 C  SpO2: 95% 94%    Last Pain:  Vitals:   11/10/24 0758  TempSrc:   PainSc: 0-No pain                 Fairy A Imara Standiford      "

## 2024-11-11 ENCOUNTER — Encounter: Payer: Self-pay | Admitting: Ophthalmology

## 2024-11-11 ENCOUNTER — Ambulatory Visit: Admitting: Urology

## 2024-11-11 DIAGNOSIS — R3915 Urgency of urination: Secondary | ICD-10-CM

## 2024-11-11 NOTE — Progress Notes (Signed)
 PTNS  Session # 17  Health & Social Factors: feels he's getting worse and having more leaking accidents Caffeine: 0 Alcohol: 0 Daytime voids #per day: 3 Night-time voids #per night: 2-3 Urgency: strong-severe Incontinence Episodes #per day: 2 Ankle used: right Treatment Setting: 7 Feeling/ Response: sensory Comments: Asked patient to keep up with how long the treatment is lasting and if it starts tapering off the longer between treatments now and let us  know when he comes back for his next treatment. Also went over that with his wife as well. They voiced understanding.  Performed By: Mathew Pinal, RN and Clotilda Cornwall, PAC  Follow Up: 1 month on 12/13/24 with PA

## 2024-11-21 ENCOUNTER — Other Ambulatory Visit: Payer: Self-pay | Admitting: Psychiatry

## 2024-11-23 NOTE — Discharge Instructions (Signed)

## 2024-11-24 ENCOUNTER — Ambulatory Visit: Payer: Self-pay | Admitting: Anesthesiology

## 2024-11-24 ENCOUNTER — Encounter: Payer: Self-pay | Admitting: Ophthalmology

## 2024-11-24 ENCOUNTER — Ambulatory Visit
Admission: RE | Admit: 2024-11-24 | Discharge: 2024-11-24 | Disposition: A | Attending: Ophthalmology | Admitting: Ophthalmology

## 2024-11-24 ENCOUNTER — Encounter
Admission: RE | Disposition: A | Discharge status: Home / Self Care | Payer: Self-pay | Source: Home / Self Care | Attending: Ophthalmology

## 2024-11-24 ENCOUNTER — Other Ambulatory Visit: Payer: Self-pay

## 2024-11-24 DIAGNOSIS — I10 Essential (primary) hypertension: Secondary | ICD-10-CM | POA: Insufficient documentation

## 2024-11-24 DIAGNOSIS — H2511 Age-related nuclear cataract, right eye: Secondary | ICD-10-CM | POA: Diagnosis present

## 2024-11-24 DIAGNOSIS — E1136 Type 2 diabetes mellitus with diabetic cataract: Secondary | ICD-10-CM | POA: Insufficient documentation

## 2024-11-24 MED ORDER — FENTANYL CITRATE (PF) 100 MCG/2ML IJ SOLN
INTRAMUSCULAR | Status: AC
  Filled 2024-11-24: qty 2

## 2024-11-24 MED ORDER — PHENYLEPHRINE HCL 10 % OP SOLN
1.0000 [drp] | OPHTHALMIC | Status: AC | PRN
  Administered 2024-11-24 (×3): 1 [drp] via OPHTHALMIC

## 2024-11-24 MED ORDER — BRIMONIDINE TARTRATE-TIMOLOL 0.2-0.5 % OP SOLN
OPHTHALMIC | Status: DC | PRN
  Administered 2024-11-24: 1 [drp] via OPHTHALMIC

## 2024-11-24 MED ORDER — CYCLOPENTOLATE HCL 2 % OP SOLN
1.0000 [drp] | OPHTHALMIC | Status: AC | PRN
  Administered 2024-11-24 (×3): 1 [drp] via OPHTHALMIC

## 2024-11-24 MED ORDER — FENTANYL CITRATE (PF) 100 MCG/2ML IJ SOLN
INTRAMUSCULAR | Status: DC | PRN
  Administered 2024-11-24: 50 ug via INTRAVENOUS

## 2024-11-24 MED ORDER — CEFUROXIME OPHTHALMIC INJECTION 1 MG/0.1 ML
INJECTION | OPHTHALMIC | Status: DC | PRN
  Administered 2024-11-24: 1 mg via INTRACAMERAL

## 2024-11-24 MED ORDER — MIDAZOLAM HCL (PF) 2 MG/2ML IJ SOLN
INTRAMUSCULAR | Status: DC | PRN
  Administered 2024-11-24 (×2): 1 mg via INTRAVENOUS

## 2024-11-24 MED ORDER — LACTATED RINGERS IV SOLN: INTRAVENOUS | Status: DC

## 2024-11-24 MED ORDER — PHENYLEPHRINE HCL 10 % OP SOLN
OPHTHALMIC | Status: AC
  Filled 2024-11-24: qty 5

## 2024-11-24 MED ORDER — SIGHTPATH DOSE#1 NA HYALUR & NA CHOND-NA HYALUR IO KIT
PACK | INTRAOCULAR | Status: DC | PRN
  Administered 2024-11-24: 1 via OPHTHALMIC

## 2024-11-24 MED ORDER — LIDOCAINE HCL (PF) 2 % IJ SOLN
INTRAOCULAR | Status: DC | PRN
  Administered 2024-11-24: 2 mL

## 2024-11-24 MED ORDER — SIGHTPATH DOSE#1 BSS IO SOLN
INTRAOCULAR | Status: DC | PRN
  Administered 2024-11-24: 15 mL via INTRAOCULAR

## 2024-11-24 MED ORDER — TETRACAINE HCL 0.5 % OP SOLN
OPHTHALMIC | Status: AC
  Filled 2024-11-24: qty 4

## 2024-11-24 MED ORDER — TETRACAINE HCL 0.5 % OP SOLN
1.0000 [drp] | OPHTHALMIC | Status: DC | PRN
  Administered 2024-11-24 (×3): 1 [drp] via OPHTHALMIC

## 2024-11-24 MED ORDER — MIDAZOLAM HCL 2 MG/2ML IJ SOLN
INTRAMUSCULAR | Status: AC
  Filled 2024-11-24: qty 2

## 2024-11-24 MED ORDER — SIGHTPATH DOSE#1 BSS IO SOLN
INTRAOCULAR | Status: DC | PRN
  Administered 2024-11-24: 74 mL via OPHTHALMIC

## 2024-11-24 MED ORDER — CYCLOPENTOLATE HCL 2 % OP SOLN
OPHTHALMIC | Status: AC
  Filled 2024-11-24: qty 2

## 2024-11-24 NOTE — Op Note (Signed)
 LOCATION:  [facility]   PREOPERATIVE DIAGNOSIS:    Nuclear sclerotic cataract right eye. H25.11   POSTOPERATIVE DIAGNOSIS:  Nuclear sclerotic cataract right eye.     PROCEDURE:  Phacoemusification with posterior chamber intraocular lens placement of the right eye   ULTRASOUND TIME: Procedures: PHACOEMULSIFICATION, CATARACT, WITH IOL  6.88 00:31.1 (Right)  LENS:   Implant Name Type Inv. Item Serial No. Manufacturer Lot No. LRB No. Used Action  LENS IOL TECNIS EYHANCE 20.5 - [id number] Intraocular Lens LENS IOL TECNIS EYHANCE 20.5 [id number] [employer]  Right 1 Implanted         SURGEON:  Dene FABIENE Etienne, MD   ANESTHESIA:  Topical with tetracaine  drops and 2% Xylocaine  jelly, augmented with 1% preservative-free intracameral lidocaine .    COMPLICATIONS:  None.   DESCRIPTION OF PROCEDURE:  The patient was identified in the holding room and transported to the operating room and placed in the supine position under the operating microscope.  The right eye was identified as the operative eye and it was prepped and draped in the usual sterile ophthalmic fashion.   A 1 millimeter clear-corneal paracentesis was made at the 12:00 position.  0.5 ml of preservative-free 1% lidocaine  was injected into the anterior chamber. The anterior chamber was filled with Viscoat viscoelastic.  A 2.4 millimeter keratome was used to make a near-clear corneal incision at the 9:00 position.  A curvilinear capsulorrhexis was made with a cystotome and capsulorrhexis forceps.  Balanced salt solution was used to hydrodissect and hydrodelineate the nucleus.   Phacoemulsification was then used in stop and chop fashion to remove the lens nucleus and epinucleus.  The remaining cortex was then removed using the irrigation and aspiration handpiece. Provisc was then placed into the capsular bag to distend it for lens placement.  A lens was then injected into the capsular bag.  The remaining viscoelastic was  aspirated.   Wounds were hydrated with balanced salt solution.  The anterior chamber was inflated to a physiologic pressure with balanced salt solution.  No wound leaks were noted. Cefuroxime  0.1 ml of a 10mg /ml solution was injected into the anterior chamber for a dose of 1 mg of intracameral antibiotic at the completion of the case.   Timolol  and Brimonidine  drops were applied to the eye.  The patient was taken to the recovery room in stable condition without complications of anesthesia or surgery.   Phoebie Shad 11/24/2024, 10:00 AM

## 2024-11-24 NOTE — Anesthesia Preprocedure Evaluation (Signed)
"                                    Anesthesia Evaluation  Patient identified by MRN, date of birth, ID band Patient awake    Reviewed: Allergy & Precautions, H&P , NPO status , Patient's Chart, lab work & pertinent test results  Airway Mallampati: II  TM Distance: >3 FB Neck ROM: Full    Dental no notable dental hx. (+) Upper Dentures   Pulmonary neg pulmonary ROS   Pulmonary exam normal breath sounds clear to auscultation       Cardiovascular hypertension, negative cardio ROS Normal cardiovascular exam Rhythm:Regular Rate:Normal     Neuro/Psych negative neurological ROS  negative psych ROS   GI/Hepatic negative GI ROS, Neg liver ROS,,,  Endo/Other  negative endocrine ROSdiabetes    Renal/GU negative Renal ROS  negative genitourinary   Musculoskeletal negative musculoskeletal ROS (+)    Abdominal   Peds negative pediatric ROS (+)  Hematology negative hematology ROS (+)   Anesthesia Other Findings   Reproductive/Obstetrics negative OB ROS                              Anesthesia Physical Anesthesia Plan  ASA: 3  Anesthesia Plan: MAC   Post-op Pain Management:    Induction: Intravenous  PONV Risk Score and Plan:   Airway Management Planned:   Additional Equipment:   Intra-op Plan:   Post-operative Plan: Extubation in OR  Informed Consent: I have reviewed the patients History and Physical, chart, labs and discussed the procedure including the risks, benefits and alternatives for the proposed anesthesia with the patient or authorized representative who has indicated his/her understanding and acceptance.     Dental advisory given  Plan Discussed with: CRNA  Anesthesia Plan Comments:         Anesthesia Quick Evaluation  "

## 2024-11-24 NOTE — Transfer of Care (Signed)
 Immediate Anesthesia Transfer of Care Note  Patient: John [name]  Procedure(s) Performed: PHACOEMULSIFICATION, CATARACT, WITH IOL  6.88 00:31.1 (Right: Eye)  Patient Location: PACU  Anesthesia Type: MAC  Level of Consciousness: awake, alert  and patient cooperative  Airway and Oxygen Therapy: Patient Spontanous Breathing and Patient connected to supplemental oxygen  Post-op Assessment: Post-op Vital signs reviewed, Patient's Cardiovascular Status Stable, Respiratory Function Stable, Patent Airway and No signs of Nausea or vomiting  Post-op Vital Signs: Reviewed and stable  Complications: No notable events documented.

## 2024-11-24 NOTE — H&P (Signed)
 " [facility]   Primary Care Physician:  Glover Lenis, MD Ophthalmologist: Dr. Dene Etienne  Pre-Procedure History & Physical: HPI:  John [name] is a 84 y.o. male here for ophthalmic surgery.   Past Medical History:  Diagnosis Date   Cancer (HCC)    Depression    Depression    Diabetes mellitus without complication (HCC)    type 2   Dissection of carotid artery    a. 06/2015 CT Head: abnl thickening of mid-dist R cervical ICA w/o narrowing - ? vasculitis and thrombosed/healing dissection; b. 01/2021 Carotid U/S: 1-39% bilat ICA stenoses.   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 Age 89 - struck in head w/ baseball --> s/p craniotomy.   Epidural hematoma (HCC)    H/O Osgood-Schlatter disease    Hand deformity, congenital    Hearing loss bilateral   Hemorrhoids    Horner syndrome    Hypertension    Migraine    Obesity    PONV (postoperative nausea and vomiting)    Prostate cancer (HCC) 2020   Rosacea    SDH (subdural hematoma) (HCC)    Sleep apnea    Spontaneous Subdural hematoma (HCC)    a. Approx 2015 - eval in [place] - conservatively managed.    Past Surgical History:  Procedure Laterality Date   ANKLE ARTHROSCOPY Left    fracture   CATARACT EXTRACTION W/PHACO Left 11/10/2024   Procedure: PHACOEMULSIFICATION, CATARACT, WITH IOL INSERTION 9.13 00:50.5;  Surgeon: Etienne Dene, MD;  Location: [facility];  Service: Ophthalmology;  Laterality: Left;   CHOLECYSTECTOMY  2007   CRANIOTOMY  1958   left frontal craniotomy for epidural hematoma   HIP ARTHROPLASTY Right 04/07/2022   Procedure: ARTHROPLASTY BIPOLAR HIP (HEMIARTHROPLASTY);  Surgeon: Cleotilde Barrio, MD;  Location: [facility];  Service: Orthopedics;  Laterality: Right;   INSERTION OF MESH  01/02/2022   Procedure: INSERTION OF MESH;  Surgeon: Rodolph Romano, MD;  Location: [facility];  Service: General;;   IRRIGATION AND DEBRIDEMENT FOOT Left 06/27/2023    Procedure: IRRIGATION AND DEBRIDEMENT FOOT;  Surgeon: Ashley Soulier, DPM;  Location: [facility];  Service: Orthopedics/Podiatry;  Laterality: Left;   LIGAMENT REPAIR Right    thumb   METATARSAL HEAD EXCISION Left 06/27/2023   Procedure: EXCISION OF BONE IN GREAT TOE JOINT;  Surgeon: Ashley Soulier, DPM;  Location: [facility];  Service: Orthopedics/Podiatry;  Laterality: Left;   RETINAL DETACHMENT SURGERY     TREATMENT FISTULA ANAL  2006    Prior to Admission medications  Medication Sig Start Date End Date Taking? Authorizing Provider  acetaminophen  (TYLENOL ) 325 MG tablet Take 2 tablets (650 mg total) by mouth every 6 (six) hours as needed for mild pain (or Fever >/= 101). 06/28/23  Yes Josette Ade, MD  aspirin  EC 81 MG tablet Take 81 mg by mouth daily.   Yes [provider]  buPROPion  (WELLBUTRIN  XL) 300 MG 24 hr tablet Take 1 tablet (300 mg total) by mouth daily. 07/28/24 01/24/25 Yes Vickey Mettle, MD  OLANZapine  (ZYPREXA ) 2.5 MG tablet Take 1 tablet (2.5 mg total) by mouth every other day. 10/12/24 01/10/25 Yes Vickey Mettle, MD  sertraline  (ZOLOFT ) 100 MG tablet Take 1 tablet (100 mg total) by mouth at bedtime. Total of 150 mg at night. Take along with 50 mg tab 05/19/24 11/24/24 Yes Hisada, Mettle, MD  sertraline  (ZOLOFT ) 50 MG tablet Take 1 tablet (50 mg total) by mouth at  bedtime. Total of 150 mg at night. Take along with 100 mg tab 09/20/24 03/19/25 Yes Hisada, Katheren, MD  tamsulosin  (FLOMAX ) 0.4 MG CAPS capsule Take 1 capsule (0.4 mg total) by mouth in the morning and at bedtime. 03/21/23  Yes Vaillancourt, Samantha, PA-C  docusate sodium  (COLACE) 100 MG capsule Take 1 capsule (100 mg total) by mouth 2 (two) times daily. Patient not taking: Reported on 11/11/2024 07/31/22   Cam Charlie Loving, DO  penicillin G potassium (PFIZERPEN) 20000000 units injection INJECT 0.3 ML INTO THE MUSCLE ONCE FOR 1 DOSE Patient not taking: Reported on 11/11/2024 07/07/24   [provider]     Allergies as of 10/19/2024   (No Known Allergies)    Family History  Problem Relation Age of Onset   Osteoporosis Mother    Depression Mother    Prostate cancer Father    Hypertension Father    Diabetes Brother    Kidney disease Neg Hx     Social History   Socioeconomic History   Marital status: Married    Spouse name: Niels   Number of children: 2   Years of education: Not on file   Highest education level: Master's degree (e.g., MA, MS, MEng, MEd, MSW, MBA)  Occupational History   Not on file  Tobacco Use   Smoking status: Never   Smokeless tobacco: Never  Vaping Use   Vaping status: Never Used  Substance and Sexual Activity   Alcohol use: Not Currently   Drug use: No   Sexual activity: Yes    Birth control/protection: None  Other Topics Concern   Not on file  Social History Narrative   Live at home with wife.    Social Drivers of Health   Tobacco Use: Low Risk (11/24/2024)   Patient History    Smoking Tobacco Use: Never    Smokeless Tobacco Use: Never    Passive Exposure: Not on file  Financial Resource Strain: Low Risk  (11/09/2024)   Received from [facility]   Overall Financial Resource Strain (CARDIA)    Difficulty of Paying Living Expenses: Not hard at all  Food Insecurity: No Food Insecurity (11/09/2024)   Received from [facility]   Epic    Within the past 12 months, you worried that your food would run out before you got the money to buy more.: Never true    Within the past 12 months, the food you bought just didn't last and you didn't have money to get more.: Never true  Transportation Needs: No Transportation Needs (11/09/2024)   Received from [facility]    In the past 12 months, has lack of transportation kept you from medical appointments or from getting medications?: No    Lack of Transportation (Non-Medical): No  Physical Activity: Not on file  Stress: Not  on file  Social Connections: Not on file  Intimate Partner Violence: Not At Risk (06/25/2023)   Humiliation, Afraid, Rape, and Kick questionnaire    Fear of Current or Ex-Partner: No    Emotionally Abused: No    Physically Abused: No    Sexually Abused: No  Depression (PHQ2-9): Low Risk (08/26/2024)   Depression (PHQ2-9)    PHQ-2 Score: 0  Alcohol Screen: Low Risk (07/04/2022)   Alcohol Screen    Last Alcohol Screening Score (AUDIT): 1  Housing: Low Risk  (11/09/2024)   Received from [facility]  In the last 12 months, was there a time when you were not able to pay the mortgage or rent on time?: No    In the past 12 months, how many times have you moved where you were living?: 0    At any time in the past 12 months, were you homeless or living in a shelter (including now)?: No  Utilities: Not At Risk (11/09/2024)   Received from [facility]   Epic    In the past 12 months has the electric, gas, oil, or water company threatened to shut off services in your home?: No  Health Literacy: Not on file    Review of Systems: See HPI, otherwise negative ROS  Physical Exam: BP 126/80   Pulse (!) 59   Temp 97.6 F (36.4 C) (Temporal)   Resp 14   Ht 6' 2 (1.88 m)   Wt 94.3 kg   SpO2 94%   BMI 26.71 kg/m  General:   Alert,  pleasant and cooperative in NAD Head:  Normocephalic and atraumatic. Lungs:  Clear to auscultation.    Heart:  Regular rate and rhythm.   Impression/Plan: Prentice Romell Finney is here for ophthalmic surgery.  Risks, benefits, limitations, and alternatives regarding ophthalmic surgery have been reviewed with the patient.  Questions have been answered.  All parties agreeable.   MITTIE GASKIN, MD  11/24/2024, 9:06 AM  "

## 2024-11-24 NOTE — Anesthesia Postprocedure Evaluation (Signed)
"   Anesthesia Post Note  Patient: Prentice Romell Finney  Procedure(s) Performed: PHACOEMULSIFICATION, CATARACT, WITH IOL  6.88 00:31.1 (Right: Eye)  Patient location during evaluation: PACU Anesthesia Type: MAC Level of consciousness: awake and alert Pain management: pain level controlled Vital Signs Assessment: post-procedure vital signs reviewed and stable Respiratory status: spontaneous breathing, nonlabored ventilation, respiratory function stable and patient connected to nasal cannula oxygen Cardiovascular status: blood pressure returned to baseline and stable Postop Assessment: no apparent nausea or vomiting Anesthetic complications: no   No notable events documented.   Last Vitals:  Vitals:   11/24/24 0902 11/24/24 1003  BP: 126/80 130/69  Pulse: (!) 59 (!) 56  Resp: 14 (!) 8  Temp: 36.4 C 36.4 C  SpO2: 94% 95%    Last Pain:  Vitals:   11/24/24 1003  TempSrc:   PainSc: 0-No pain                 Fairy A Soraya Paquette      "

## 2024-12-09 ENCOUNTER — Ambulatory Visit: Admitting: Psychiatry

## 2024-12-13 ENCOUNTER — Ambulatory Visit: Admitting: Physician Assistant
